# Patient Record
Sex: Male | Born: 1945
Health system: Southern US, Community
[De-identification: ages and names within clinical notes are randomized; demographics above are authoritative.]

## PROBLEM LIST (undated history)

## (undated) DIAGNOSIS — N401 Enlarged prostate with lower urinary tract symptoms: Secondary | ICD-10-CM

## (undated) DIAGNOSIS — M4620 Osteomyelitis of vertebra, site unspecified: Secondary | ICD-10-CM

## (undated) DIAGNOSIS — Z9861 Coronary angioplasty status: Secondary | ICD-10-CM

## (undated) DIAGNOSIS — Z8616 Personal history of COVID-19: Secondary | ICD-10-CM

## (undated) DIAGNOSIS — I878 Other specified disorders of veins: Secondary | ICD-10-CM

## (undated) DIAGNOSIS — I10 Essential (primary) hypertension: Secondary | ICD-10-CM

## (undated) DIAGNOSIS — G47419 Narcolepsy without cataplexy: Secondary | ICD-10-CM

## (undated) DIAGNOSIS — E7800 Pure hypercholesterolemia, unspecified: Secondary | ICD-10-CM

## (undated) DIAGNOSIS — G629 Polyneuropathy, unspecified: Secondary | ICD-10-CM

## (undated) DIAGNOSIS — K6812 Psoas muscle abscess: Secondary | ICD-10-CM

## (undated) DIAGNOSIS — M5137 Other intervertebral disc degeneration, lumbosacral region: Secondary | ICD-10-CM

## (undated) DIAGNOSIS — N329 Bladder disorder, unspecified: Secondary | ICD-10-CM

## (undated) DIAGNOSIS — I219 Acute myocardial infarction, unspecified: Secondary | ICD-10-CM

## (undated) DIAGNOSIS — U071 COVID-19: Secondary | ICD-10-CM

## (undated) DIAGNOSIS — I252 Old myocardial infarction: Secondary | ICD-10-CM

## (undated) DIAGNOSIS — R600 Localized edema: Secondary | ICD-10-CM

## (undated) DIAGNOSIS — Z8739 Personal history of other diseases of the musculoskeletal system and connective tissue: Secondary | ICD-10-CM

## (undated) DIAGNOSIS — R200 Anesthesia of skin: Secondary | ICD-10-CM

## (undated) DIAGNOSIS — Z973 Presence of spectacles and contact lenses: Secondary | ICD-10-CM

## (undated) DIAGNOSIS — R7303 Prediabetes: Secondary | ICD-10-CM

## (undated) DIAGNOSIS — Z201 Contact with and (suspected) exposure to tuberculosis: Secondary | ICD-10-CM

## (undated) DIAGNOSIS — M51379 Other intervertebral disc degeneration, lumbosacral region without mention of lumbar back pain or lower extremity pain: Secondary | ICD-10-CM

## (undated) DIAGNOSIS — F4310 Post-traumatic stress disorder, unspecified: Secondary | ICD-10-CM

## (undated) DIAGNOSIS — N400 Enlarged prostate without lower urinary tract symptoms: Secondary | ICD-10-CM

## (undated) DIAGNOSIS — Z85820 Personal history of malignant melanoma of skin: Secondary | ICD-10-CM

## (undated) DIAGNOSIS — C4490 Unspecified malignant neoplasm of skin, unspecified: Secondary | ICD-10-CM

## (undated) DIAGNOSIS — I2510 Atherosclerotic heart disease of native coronary artery without angina pectoris: Secondary | ICD-10-CM

## (undated) HISTORY — DX: Acute myocardial infarction, unspecified: I21.9

## (undated) HISTORY — DX: Atherosclerotic heart disease of native coronary artery without angina pectoris: I25.10

## (undated) HISTORY — DX: Psoas muscle abscess: K68.12

## (undated) HISTORY — DX: Narcolepsy without cataplexy: G47.419

## (undated) HISTORY — PX: CORONARY ANGIOPLASTY: SHX604

## (undated) HISTORY — PX: ANTERIOR CERVICAL DECOMP/DISCECTOMY FUSION: SHX1161

## (undated) HISTORY — PX: CATARACT EXTRACTION: SUR2

## (undated) HISTORY — PX: WOUND DEBRIDEMENT: SHX247

## (undated) HISTORY — PX: CORONARY ARTERY BYPASS GRAFT: SHX141

## (undated) HISTORY — DX: Essential (primary) hypertension: I10

## (undated) HISTORY — DX: Pure hypercholesterolemia, unspecified: E78.00

## (undated) HISTORY — DX: Post-traumatic stress disorder, unspecified: F43.10

## (undated) HISTORY — PX: BACK SURGERY: SHX140

## (undated) HISTORY — DX: Unspecified malignant neoplasm of skin, unspecified: C44.90

## (undated) HISTORY — DX: Personal history of COVID-19: Z86.16

## (undated) HISTORY — DX: Osteomyelitis of vertebra, site unspecified: M46.20

---
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
## 1968-05-13 HISTORY — PX: OTHER SURGICAL HISTORY: SHX169

## 1968-05-13 HISTORY — PX: MELANOMA EXCISION: SHX5266

## 1983-05-14 DIAGNOSIS — F4310 Post-traumatic stress disorder, unspecified: Secondary | ICD-10-CM

## 1983-05-14 HISTORY — DX: Post-traumatic stress disorder, unspecified: F43.10

## 1999-11-13 ENCOUNTER — Encounter: Admission: RE | Admit: 1999-11-13 | Discharge: 1999-11-13 | Payer: Self-pay | Admitting: *Deleted

## 2000-02-20 ENCOUNTER — Encounter: Payer: Self-pay | Admitting: Neurosurgery

## 2000-02-22 ENCOUNTER — Encounter: Payer: Self-pay | Admitting: Neurosurgery

## 2000-02-22 ENCOUNTER — Inpatient Hospital Stay (HOSPITAL_COMMUNITY): Admission: RE | Admit: 2000-02-22 | Discharge: 2000-02-23 | Payer: Self-pay | Admitting: Neurosurgery

## 2000-03-07 ENCOUNTER — Encounter: Admission: RE | Admit: 2000-03-07 | Discharge: 2000-03-07 | Payer: Self-pay | Admitting: Internal Medicine

## 2000-03-07 ENCOUNTER — Encounter: Payer: Self-pay | Admitting: Neurosurgery

## 2001-05-13 HISTORY — PX: CERVICAL FUSION: SHX112

## 2002-07-12 ENCOUNTER — Encounter: Payer: Self-pay | Admitting: Infectious Diseases

## 2002-07-12 ENCOUNTER — Ambulatory Visit (HOSPITAL_COMMUNITY): Admission: RE | Admit: 2002-07-12 | Discharge: 2002-07-12 | Payer: Self-pay | Admitting: Infectious Diseases

## 2002-12-08 ENCOUNTER — Encounter: Admission: RE | Admit: 2002-12-08 | Discharge: 2002-12-08 | Payer: Self-pay | Admitting: Internal Medicine

## 2003-01-16 ENCOUNTER — Emergency Department (HOSPITAL_COMMUNITY): Admission: EM | Admit: 2003-01-16 | Discharge: 2003-01-16 | Payer: Self-pay | Admitting: Emergency Medicine

## 2003-02-01 ENCOUNTER — Encounter: Payer: Self-pay | Admitting: Internal Medicine

## 2003-02-01 ENCOUNTER — Encounter: Admission: RE | Admit: 2003-02-01 | Discharge: 2003-02-01 | Payer: Self-pay | Admitting: Internal Medicine

## 2003-03-09 ENCOUNTER — Encounter: Admission: RE | Admit: 2003-03-09 | Discharge: 2003-03-09 | Payer: Self-pay | Admitting: Neurosurgery

## 2003-06-26 ENCOUNTER — Inpatient Hospital Stay (HOSPITAL_COMMUNITY): Admission: AD | Admit: 2003-06-26 | Discharge: 2003-07-02 | Payer: Self-pay | Admitting: Cardiology

## 2003-06-26 ENCOUNTER — Encounter: Payer: Self-pay | Admitting: Emergency Medicine

## 2003-06-28 DIAGNOSIS — Z951 Presence of aortocoronary bypass graft: Secondary | ICD-10-CM

## 2003-06-28 HISTORY — DX: Presence of aortocoronary bypass graft: Z95.1

## 2003-08-01 ENCOUNTER — Encounter (HOSPITAL_COMMUNITY): Admission: RE | Admit: 2003-08-01 | Discharge: 2003-10-30 | Payer: Self-pay | Admitting: Cardiovascular Disease

## 2008-04-29 ENCOUNTER — Emergency Department (HOSPITAL_COMMUNITY): Admission: EM | Admit: 2008-04-29 | Discharge: 2008-04-29 | Payer: Self-pay | Admitting: Emergency Medicine

## 2008-05-01 ENCOUNTER — Ambulatory Visit (HOSPITAL_COMMUNITY): Admission: RE | Admit: 2008-05-01 | Discharge: 2008-05-01 | Payer: Self-pay | Admitting: Neurosurgery

## 2008-05-13 LAB — HM HEPATITIS C SCREENING LAB: HM Hepatitis Screen: NEGATIVE

## 2010-06-02 ENCOUNTER — Encounter: Payer: Self-pay | Admitting: Neurosurgery

## 2010-09-28 NOTE — H&P (Signed)
[place]. [facility]  Patient:    Ivan [name], Ivan [name]                      MRN: [id number] Adm. Date:  [id number] Attending:  Emeterio Reeve                         History and Physical  ADMITTING DIAGNOSIS:  Cervical spondylosis with early myelopathy.  HISTORY OF PRESENT ILLNESS:  A 65 year old right-handed white gentleman, in June, had a episode of bilateral pain, numbness and weakness in his arms. This was transient and prompted an MRI. This demonstrated severe spinal stenosis, and he was referred to me.  On my visit with him we talked for some time and he needed to get some affairs in order, and now presents for an anterior cervicectomy effusion.  PAST MEDICAL HISTORY:  Otherwise, benign.  PAST SURGICAL HISTORY: 1. Appendectomy in 1958. 2. Benign leg tumor in 1970.  CURRENT MEDICATIONS:  Aspirin and Vioxx.  ALLERGIES:  None.  SOCIAL HISTORY:  He smokes a pipe during the day. Drinks alcohol on a social basis. 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  FAMILY HISTORY:  Both parents are deceased. No significant history is given.  REVIEW OF SYSTEMS:  Remarkable for hypercholesterolemia, arm weakness, arm pain and neck pain.  PHYSICAL EXAMINATION:  HEENT:  Within normal limits.  NECK:  Reasonable range of motion in his neck. No Lhermittes phenomenon.  CHEST:  A few crackles.  CARDIAC:  Regular rate and rhythm.  ABDOMEN:  Nontender. No hepatosplenomegaly.  EXTREMITIES:  Without clubbing or cyanosis.  GENITOURINARY:  Deferred.  EXTREMITIES:  Peripheral pulses are good.  NEUROLOGIC:  He is awake, alert and oriented. Cranial nerves are intact. Motor exam showed 5/5 strength throughout the upper and lower extremities. There is no current sensory deficit, but he does have episodic tingling into both hands. Reflexes are 2 at the biceps, 1 at the triceps, 1 at the brachial radialis. Hoffmanns is negative in the lower  extremities, are nonmyopathic.  He comes accompanied with an MRI demonstrating severe cervical spinal stenosis in the canal area at C4-5, C5-6 and C6-7. C5-6 is worse, however. The canal is probably in the order of 6-7 mm. There is no myelomalacia.  CLINICAL IMPRESSION:  Cervical spondylosis and myopathic events. Based on the extreme narrowing of the canal we have recommended an anterior cervicectomy effusion at C4-5, C5-6, and C6-7. The risks and benefits of this approach have been discussed with him and he wishes to proceed. DD:  02/22/00 TD:  02/22/00 Job: [id number]

## 2010-09-28 NOTE — Op Note (Signed)
[place]. [facility]  Patient:    Ivan [name], Ivan [name]                      MRN: [id number] Proc. Date: 02/22/00 Adm. Date:  [id number] Attending:  Emeterio Reeve                           Operative Report  PREOPERATIVE DIAGNOSIS:  Severe spondylosis at C4-5, C5-6, C6-C7.  POSTOPERATIVE DIAGNOSIS:  Severe spondylosis at C4-5, C5-6, C6-C7.  OPERATION:  Anterior cervical diskectomy and fusion at C4-5, C5-6, and C6-C7.  SURGEON:  Payton Doughty, M.D.  ANESTHESIA:  General endotracheal.  PREP:  Sterile Betadine prep and scrubbed with alcohol wipe.  COMPLICATIONS:  None.  DESCRIPTION OF PROCEDURE:   This is a 65 year old right handed white gentleman with severe cervical spondylosis who was taken to the operating room, anesthetized and intubated.  Placed supine on the operating room table. Following shave, prep and drape in the usual sterile fashion, skin was incised starting one fingerbreadths above the clavicle for a distance of approximately 10 cm paralleling the sternocleidomastoid muscle on the left side.  The tensor was identified and elevated, divided and undermined.  Sternocleidomastoid was identified and revealed the carotid artery, which retracted laterally to the left.  Trachea and esophagus retracted laterally to the right exposing the bones of the anterior cervical spine.  Marker was placed. Intraoperative x-ray obtained and correctness of level.  The omohyoid muscle was partly divided to provide access to the C6-7 disc space.  The longus coli was taken down and the self retaining retractor placed.  Diskectomy was then carried out at C4-5, C5-6 and C6-7. It was done first under gross observation and then using the operating microscope.  At C4-5 and C6-7 there was moderate to severe cervical spondylitic disease with posterior osteophytes encroaching into the spinal canal.  These were removed without difficulty.  At C5-C6 there was extremely severe  spinal stenosis with a large osteophyte that was removed without difficulty and without cord compression.  Neuroforamin at each level were explored bilaterally and found to be open.  Then 7 mm bone grafts were fashioned for patella Allograft and tapped into place.  A 63 mm tethered plate was then placed with 13 mm screws, two in C4, one in C5, one in C6 and two in C7.  Intraoperative x-rays showed good placement of bone grafts, plate and screws.  The wound was irrigated and hemostasis assured.  The platysma was reapproximated with 3-0 Vicryl interrupted fashion and subcutaneous tissue was reapproximated with 3-0 Vicryl interrupted fashion. Subcuticular tissues were approximated with 3-0 Vicryl in an interrupted fashion.  The skin was closed with 4-0 Vicryl in a running subcuticular fashion.  Benzoin and Steri-Strips were placed and made occlusive with Telfa and Op-Site.  The patient then placed in an Aspen collar and returned to the recovery room in good condition. D:  02/22/00 TD:  02/23/00 Job: [id number]

## 2010-09-28 NOTE — Discharge Summary (Signed)
NAME:  Ivan [name], Ivan [name]                         ACCOUNT NO.:  [id number]   MEDICAL RECORD NO.:  [id number]                   PATIENT TYPE:  INP   LOCATION:  2012                                 FACILITY:  [facility]   PHYSICIAN:  Salvatore Decent. Cornelius Moras, M.D.              DATE OF BIRTH:  Nov 23, 1945   DATE OF ADMISSION:  06/26/2003  DATE OF DISCHARGE:  07/02/2003                                 DISCHARGE SUMMARY   PRIMARY ADMITTING DIAGNOSIS:  Chest pain.   ADDITIONAL/DISCHARGE DIAGNOSES:  1. Acute anteroseptal myocardial infarction.  2. Severe two vessel coronary artery disease.  3. History of anxiety.  4. History of cervical diskectomy in 2001 by Dr. Channing Mutters.   PROCEDURES PERFORMED:  1. Cardiac catheterization.  2. Coronary artery bypass grafting x4 (left internal mammary artery to the     distal LAD, right internal mammary artery to the distal right coronary     artery, saphenous vein graft to the first diagonal, saphenous vein graft     to the first circumflex marginal).  3. Endoscopic vein harvest, right thigh.   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  He has no previous history of coronary artery  disease.  On the date of this admission, he awoke with sharp chest pain  which radiated to his back and down his left arm.  He had some nausea, as  well as vomiting.  His symptoms waxed and waned, and he went on to work, and  had the staff at the nursing home check his blood pressure.  His blood  pressure was found to be elevated, and he continued to feel poorly;  therefore, he had one of his associates drive him to the emergency  department at [facility] where he was evaluated.  The initial EKG showed  evidence of an acute anteroseptal myocardial infarction.  He was started on  IV nitroglycerin and IV heparin, and a cardiology consultation was obtained.  He was seen by Dr. Patty Sermons, and was found to have elevated cardiac  enzymes.   Because of his acute myocardial infarction and his ongoing  symptoms, he was transferred to [facility] emergency room for cardiac  catheterization.   HOSPITAL COURSE:  He was transferred from [facility] to [facility], and was  taken immediately to the cardiac catheterization laboratory.  He underwent  cardiac catheterization by Dr. Elease Hashimoto, and was found to have severe two  vessel coronary artery disease.  He underwent an angioplasty of the LAD with  successful restoration of normal antegrade flow; however, there was a high-  grade irregular plaque which remained in the proximal portion of the LAD,  arising from the take-off of a large diagonal branch.  Because of this  anatomy, it was not felt to be favorable for a stent placement.  A  cardiothoracic surgery consultation was obtained while the  patient remained  in the catheterization laboratory.  Dr. Cornelius Moras saw the patient and agreed that  surgical revascularization was his best option.  He remained stable and had  no further chest pain.  He was able to be returned to the floor and  continued on IV heparin, Integrilin, and nitroglycerin.  He remained stable  and pain free.  After a complete preoperative work-up, he was able to be  taken to the operating room on June 28, 2003 where he underwent CABG x4  as described in detail above.  He tolerated the procedure well and was  transferred to the SICU in stable condition.  He was able to be extubated  shortly after surgery.  He was hemodynamically stable and doing well on  postoperative day #1.  At that time, he was mobilized.  His chest tubes and  hemodynamic monitoring devices were removed.  He was started on a beta  blocker, as well as his home dose of Xanax.  He remained stable throughout  the day, and was able to be transferred to the floor late in the day on  postoperative day #1.  Overall, he has had an uneventful postoperative  course.  He has been somewhat volume overloaded, and has  been started on  Lasix, and is diuresing well from this.  He has been ambulating in the  halls, both with cardiac rehabilitation phase I, as well as independently,  and is doing very well.  His blood pressures have remained stable, and he  has been started on an ACE inhibitor.  He has been weaned from supplemental  oxygen, and is maintaining O2 saturations of greater than 90% on room air.  His most recently laboratories showed a hemoglobin of 10.5, hematocrit 30.5,  platelets 180, white count 12.7, potassium 4.4, BUN 17, creatinine 1.0.  These were drawn on June 30, 2003.  A chest x-ray also performed on that  day mild atelectasis; otherwise, stable.  His surgical incision sites were  all healing well.  He has been ambulating in the halls, tolerating a regular  diet, and is having normal bowel and bladder function.  It is felt that if  he continues to remain stable over the next 24 hours, he should be ready for  discharge home on July 02, 2003.   DISCHARGE MEDICATIONS:  1. Enteric-coated aspirin 325 mg daily.  2. Lipitor 40 mg daily.  3. Lopressor 25 mg b.i.d.  4. Altace 2.5 mg daily.  5. Lasix 40 mg daily x1 week.  6. K-Dur 20 mEq daily x1 week.  7. Xanax 0.5 mg p.r.n.  8. Tylox 1-2 q.4h. p.r.n. pain.   DISCHARGE INSTRUCTIONS:  1. He is to refrain from driving, heavy lifting, or strenuous activity.  He     may continue ambulating daily and using his incentive spirometer.  2. He may shower daily and clean his incisions with soap and water.  3. He will continue a low-fat, low-sodium diet.   DISCHARGE FOLLOW UP:  1. He is asked to follow up with Dr. Patty Sermons in 2 weeks and have a chest x-     ray at that visit.  2. He will then see Dr. Cornelius Moras on Monday, July 25, 2003, at 11:45 a.m.  He is     asked to bring his chest x-ray to this visit for Dr. Cornelius Moras to review.  In     the interim, he is asked to call our office if he experiences any     problems  or has questions.       Coral Ceo, P.A.                        Salvatore Decent. Cornelius Moras, M.D.    GC/MEDQ  D:  07/01/2003  T:  07/02/2003  Job:  [id number]   cc:   Corwin Levins, M.D. [facility]   Cassell Clement, M.D.  [address]  [place]  [place] [address]  Fax: [phone]

## 2010-09-28 NOTE — Op Note (Signed)
NAME:  Ivan [name], Ivan [name]                         ACCOUNT NO.:  [id number]   MEDICAL RECORD NO.:  [id number]                   PATIENT TYPE:  INP   LOCATION:  2306                                 FACILITY:  [facility]   PHYSICIAN:  Salvatore Decent. Cornelius Moras, M.D.              DATE OF BIRTH:  07/17/1945   DATE OF PROCEDURE:  06/28/2003  DATE OF DISCHARGE:                                 OPERATIVE REPORT   PREOPERATIVE DIAGNOSIS:  Severe 2-vessel coronary artery disease, status  post acute anterior myocardial infarction.   POSTOPERATIVE DIAGNOSIS:  Severe 2-vessel coronary artery disease, status  post acute anterior myocardial infarction.   PROCEDURE:  Median sternotomy for coronary artery bypass grafting x4 (left  internal mammary artery to the distal left anterior descending coronary  artery, right internal mammary artery to the distal  right coronary artery,  saphenous vein graft to the 1st diagonal branch, saphenous vein graft to the  1st circumflex marginal branch, endoscopic vein harvest from right thigh).   SURGEON:  Salvatore Decent. Cornelius Moras, M.D.   ASSISTANT:  Salvatore Decent. Dorris Fetch, M.D.   ASSISTANT:  Jerold Coombe, P.A.   ANESTHESIA:  General.   INDICATIONS FOR PROCEDURE:  The patient is a 65 year old male with no  previous  cardiac  history but risk factors including  a history of  hyperlipidemia and tobacco use. The patient was admitted June 26, 2003,  with an acute anterior myocardial infarction. He was taken to the  catheterization laboratory  where cardiac catheterization demonstrated  severe 2-vessel coronary artery disease with acute occlusion of the left  anterior descending coronary artery. The patient underwent percutaneous  transluminal coronary angioplasty of the left anterior descending coronary  artery with successful  restoration of normal antegrade flow. There remains  high-grade disease in this vessel at the takeoff of the bifurcation and the  stent was not placed due  to anatomical concerns. There is also high-grade  disease in the right coronary artery and low-grade disease in the circumflex  system. A full consultation  note has been dictated previously.   DESCRIPTION OF PROCEDURE:  The patient has been counseled at length  regarding the indications and potential benefits of coronary artery bypass  grafting. Alternative treatment strategies have been discussed. He  understands and accepts all associated risks of surgery including but not  limited to risks of death, stroke, myocardial infarction, congestive heart  failure, respiratory failure, pneumonia, bleeding requiring blood  transfusion, arrhythmia, infection and recurrent coronary artery disease.  All of his questions have been addressed.   DESCRIPTION OF PROCEDURE:  The patient was brought to the operating room on  the above mentioned date and central monitoring is established by the  anesthesia service. Specifically a Swann-Ganz catheter is placed through the  right internal jugular approach. A radial arterial line is placed.  Intravenous antibiotics are administered. Following  induction with general  endotracheal anesthesia under the care of  and direction of Dr. Jean Rosenthal, a  Foley catheter is placed.   Baseline transesophageal echocardiogram  is performed by Dr. Jean Rosenthal. This  confirms the presence of severe  hypokinesis involving the distal anterior  wall and apex. The remainder of the left ventricle appears to move normally  and overall global left ventricular performance is fairly good. There is no  aortic valve disease. There is no mitral valve disease. No other  significant abnormalities are identified. The patient's chest, abdomen, both  groins and both lower extremities are prepped and draped in a sterile  manner.   A median sternotomy incision is performed and the left internal mammary  artery is dissected from the chest wall and prepared for bypass grafting.  The left internal  mammary artery is a good quality conduit. Following  this  the  right internal mammary artery is also dissected from the chest wall and  prepared for bypass grafting. This vessel is also a good quality conduit.   Simultaneously, saphenous vein was obtained from the patient's right thigh  using endoscopic vein harvest technique. The saphenous vein is a good  quality conduit. The patient was heparinized systemically.   The pericardium is opened. The ascending aorta is normal in appearance. The  ascending aorta and the right atrium are cannulated for cardiopulmonary  bypass. Adequate heparinization is verified. Cardiopulmonary bypass is begun  and the surface of the heart  is inspected. There is a dusky appearance  involving the distal  anterior wall and the apex of the left ventricle  consistent with recent  myocardial infarction. No other  abnormalities are  identified. There is hard palpable  plaque in the proximal left circumflex  coronary artery and the 1st circumflex marginal branch. There is palpable  disease in the proximal left anterior descending coronary artery and the  proximal right coronary  artery as well. No other  abnormalities are  identified.   The distal sites are selected for coronary artery bypass grafting. Both  internal mammary  arteries are trimmed to appropriate length. A temperature  probe is placed in the left ventricular septum. A  cardioplegia catheter is  placed in the ascending aorta. The patient is cooled to 32 degrees systemic  temperature. The aortic cross clamp  is applied and cardioplegia is  delivered in an antegrade fashion through the aortic root. Iced saline slush  is applied for topical hypothermia.   The initial cardioplegic arrest and myocardial cooling are felt to be  excellent. Repeat  doses of cardioplegia are administered intermittently  through the cross clamp  portion of the operation, both through the aortic root and down the subsequently  placed vein graft to maintain septal  temperature below 15 degrees centigrade. The following distal coronary  anastomoses are performed.   1. The 1st circumflex marginal branch is grafted with a saphenous vein graft     in an end-to-side fashion. This coronary measures 2.0 mm in diameter and     is of good quality.  2. The 1st diagonal branch off the left anterior descending coronary artery     is grafted with a saphenous vein graft  in an end-to-side fashion. This     coronary measures 1.7 mm in diameter and is of good quality.  3. The distal right coronary artery is grafted with a right internal mammary     artery in an end-to-side fashion. This coronary measures 2.2 in diameter     and is of good quality.  4. The  distal left anterior descending coronary artery is grafted with a     left internal mammary artery in an end-to-side fashion. This coronary     measures 2.0 mm in diameter and is of good quality.   Both proximal saphenous vein anastomoses are performed directly to the  ascending aorta prior to removal of the aortic cross clamp. The septal  temperature is noted to rise rapidly upon reperfusion of the internal  mammary artery grafts. The heart  begins to beat spontaneously. All air is  evacuated from the aortic root. The aortic cross clamp  is removed after a  total cross clamp time of 70 minutes.   Normal sinus rhythm resumes spontaneously. All proximal and distal  anastomoses are inspected for hemostasis and appropriate graft  orientation.  Epicardial pacing wires are affixed to the right ventricular outflow tract  and to the right atrial appendage. The patient is rewarmed to a 37 degree  centigrade temperature. The patient was weaned from cardiopulmonary bypass  without difficulty. The patient's rhythm at separation from bypass is normal  sinus rhythm. No inotropic support is required. Total cardiopulmonary bypass  time for the operation is 87 minutes.   Follow up  transesophageal echocardiogram performed by Dr. Jean Rosenthal after  separation from bypass demonstrates preserved left ventricular function with  essentially no changes. There is clearly contractile activity in the distal  anterior wall and apex, although it remains hypocontractile. No other  abnormalities are identified.   The heart begins to beat spontaneously without need for cardioversion. All  proximal and distal anastomoses were  inspected for hemostasis and  appropriate graft  orientation. Epicardial pacing wires are affixed to the  right ventricular outflow tract into the right atrial appendage. The patient  was weaned from cardiopulmonary bypass without difficulty. The patient's  rhythm at separation from bypass is normal sinus rhythm. No inotropic  support was required. Total cardiopulmonary bypass time for the operation  was 71 minutes.   The venous and arterial cannulae were removed uneventfully. Protamine was administered to reverse the anticoagulation. The mediastinum and both  pleural spaces are irrigated with saline solution containing vancomycin.  Meticulous surgical hemostasis is ascertained. The mediastinum and both  pleural spaces are drained with 4 chest tubes placed through separate stab  incisions inferiorly. The median sternotomy is closed in a routine fashion.  The right thigh incision is closed in multiple layers in a routine fashion.  All skin incisions are closed with subcuticular skin closures.   The patient tolerated the procedure well. He was transported to the surgical  intensive care unit in stable condition. There were no intraoperative  complications. All sponge, instrument and needle counts were verified  correct at the completion of the operation. No blood products were  administered.                                               Salvatore Decent. Cornelius Moras, M.D.    CHO/MEDQ  D:  06/28/2003  T:  06/28/2003  Job:  [id number]   cc:   Cassell Clement, M.D.  [address]  [place]  [place] [address]  Fax: [phone]   Vesta Mixer, M.D.  [address], [address]  [place]  [place] [address]  Fax: [phone]   Corwin Levins, M.D. [facility]

## 2010-09-28 NOTE — Consult Note (Signed)
NAME:  Ivan [name], Ivan [name]                         ACCOUNT NO.:  [id number]   MEDICAL RECORD NO.:  [id number]                   PATIENT TYPE:  INP   LOCATION:  2930                                 FACILITY:  [facility]   PHYSICIAN:  Salvatore Decent. Cornelius Moras, M.D.              DATE OF BIRTH:  [birth year]   DATE OF CONSULTATION:  06/26/2003  DATE OF DISCHARGE:                                   CONSULTATION   REQUESTING PHYSICIAN:  Dr. Kristeen Miss.   PRIMARY CARDIOLOGIST:  Dr. Ronny Flurry   PRIMARY CARE PHYSICIAN:  Dr. Oliver Barre   REASON FOR CONSULTATION:  Severe two-vessel coronary artery disease with  acute anterior myocardial infarction.   HISTORY OF PRESENT ILLNESS:  Ivan [name] is a 65 year old white male with no  previous history of coronary artery disease but risk factors including  history of hyperlipidemia and tobacco use.  He was in his usual state of  health until approximately 5 a.m. when he developed sudden onset of severe  substernal chest pain associated with nausea.  The pain waxed and waned but  persisted for several hours prompting him to present to the emergency room  at [facility].  There, he was evaluated by Dr. Patty Sermons  and found to have 12-lead electrocardiogram with slight ST segment elevation  in the anterior lead, suggestive of early changes of acute anterior  myocardial infarction.  His initial set of cardiac enzymes were positive  with a total CK of 428, CK-MB fraction 31, troponin I 1.26.  The patient was  promptly transferred to [facility] where he underwent acute cardiac  catheterization by Dr. Delane Ginger.  Findings at the time of catheterization  include severe two-vessel coronary artery disease with acute occlusion of  the left anterior descending coronary artery.  There is severe akinesis of  the distal anterior wall and apex.  The left ventricular end-diastolic  pressure is mildly elevated at 22 mmHg.  The patient underwent  percutaneous  transilluminable coronary angioplasty at the left anterior descending  coronary artery with successful restoration of normal antegrade flow in the  vessel.  However, there remains high-grade irregular plaque in the proximal  left anterior descending coronary artery arising at takeoff of the large  diagonal branch.  This is felt not to be favorable for placement of stent  and stabilization of the plaque.  Cardiac surgical consultation was  requested.   REVIEW OF SYSTEMS:  GENERAL:  The patient reports feeling well up until this  morning.  He has a good appetite and has not been gaining or losing weight.  CARDIAC:  The patient specifically denies any previous history of substernal  chest pain, either with exertion or at rest prior to that which awoke him  from his sleep early this morning.  He has only mild exertional shortness of  breath which has not changed recently.  He denies any resting  shortness of  breath, PND, orthopnea, lower extremity edema, palpitations, syncope.  He  has mild exertional fatigue.  RESPIRATORY:  Negative.  The patient denies  productive cough, hemoptysis, wheezing.  The patient denies shortness of  breath at present.  GASTROINTESTINAL:  Negative.  The patient reports good  appetite, normal bowel function.  MUSCULOSKELETAL:  Negative with the  exception of chronic problems related to degenerative joint disease in his  neck and cervical spine.  NEUROLOGIC:  Negative.  HEENT:  Negative.  INFECTIOUS:  Negative.  URINARY:  Negative.  HEMATOLOGIC:  Negative.  The  patient denies bleeding, diathesis or easy bruising.  ENDOCRINE:  Negative.  The patient denies symptoms suggestive of diabetes.  He has known history of  mild hyperlipidemia which has not been treated.   PAST MEDICAL HISTORY:  Notable for a history of hyperlipidemia and cervical  disk disease with degenerative arthritis.  The patient denies known history  of hypertension, coronary artery  disease, congestive heart failure, previous  stroke, diabetes mellitus.   PAST SURGICAL HISTORY:  The patient underwent cervical laminectomy and  decompression by Dr. Trey [name] several years ago.  The patient underwent  appendectomy in the distant past.   SOCIAL HISTORY:  The patient is married and lives with his wife here in  [place].  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  He smokes a pipe regularly.  He has a remote history of cigarette smoking.  He drinks wine on a regular basis.  He denies history of excessive alcohol  ingestion.   FAMILY HISTORY:  Notable for the absence of premature coronary artery  disease.   MEDICATIONS PRIOR TO ADMISSION:  Aspirin 325 mg daily.   DRUG ALLERGIES:  None known.   PHYSICAL EXAMINATION:  GENERAL:  Well-appearing white male who remains in  the supine position in the cath lab on the table.  VITAL SIGNS:  He is currently in sinus rhythm with blood pressure 126/83,  pulse 94.  He denies ongoing chest pain or shortness of breath, and he is  breathing comfortably.  HEENT:  Grossly unrevealing.  NECK:  Supple.  There is no jugular venous distension.  Auscultation of the  chest reveals clear and symmetrical breath sounds bilaterally.  No wheezes  or rhonchi are noted.  CARDIOVASCULAR:  Regular rate and rhythm.  No murmurs or rubs are noted.  ABDOMEN:  Soft, nontender.  EXTREMITIES:  Warm, well-perfused.  There is a sheath in the right femoral  artery groin.  There is no lower extremity edema.  Distal pulses are  palpable.  There is no sign of venous insufficiency.  NEUROLOGIC:  Grossly  nonfocal and symmetrical.  RECTAL/GU:  Both deferred.   DIAGNOSTIC TESTS:  Cardiac catheterization performed by Dr. Elease Hashimoto is  reviewed and notable for findings as discussed previously.  After completion  of the angioplasty, there remains high-grade irregular plaque in the  proximal left anterior descending coronary artery, arising at  takeoff of the large diagonal branch.  The left circumflex system appears to be relatively  free of disease, although there is some plaque at the origin of the large  circumflex marginal branch.  This does not appear to be high-grade or flow-  limiting.  The right coronary artery is large and a dominant vessel.  There  is 90% stenosis at the proximal portion of this vessel.  The remainder of  the majority of the right coronary artery is severely aneurysmal.  There are  good distal targets for bypass grafting.  Left ventricular function is as  described previously with severe anterior apical akinesis.   IMPRESSION:  Severe two-vessel coronary artery disease, status post acute  anterior myocardial infarction, status post percutaneous transluminal  coronary angioplasty with restoration of antegrade flow in the left anterior  descending coronary artery.  Mr. Rudman appears stable at this point in  time.  I agree that he would probably best be treated in the long run with  surgical revascularization.   PLAN:  I have outlined options at length with Mr. Schomer briefly here in the  cath lab this afternoon.  All of his questions have been addressed.  We have  recommended that we proceed with surgery within the next 24-48 hours or  sooner if he should become unstable.  We will follow up later this afternoon  and discuss plans further.  All of his questions have been answered.                                               Salvatore Decent. Cornelius Moras, M.D.    CHO/MEDQ  D:  06/26/2003  T:  06/26/2003  Job:  [id number]   cc:   Vesta Mixer, M.D.  [address], [address]  [place]  [place] [address]  Fax: [phone]   Cassell Clement, M.D.  [address], [address]  [place]  [place] [address]  Fax: [phone]   Corwin Levins, M.D. [facility]

## 2010-09-28 NOTE — H&P (Signed)
NAME:  NAFTULI, DALSANTO                         ACCOUNT NO.:  [id number]   MEDICAL RECORD NO.:  [id number]                   PATIENT TYPE:  INP   LOCATION:  NA                                   FACILITY:  [facility]   PHYSICIAN:  Cassell Clement, M.D.              DATE OF BIRTH:  [birth year]   DATE OF ADMISSION:  06/26/2003  DATE OF DISCHARGE:                                HISTORY & PHYSICAL   HISTORY:  This is a 65 year old gentleman who is complaining of chest pain.  He has no history of known coronary artery disease, high blood pressure, or  past history of any heart trouble.  He is on no regular medicine except for  daily aspirin, and he takes occasional Xanax for anxiety.  This morning at  approximately 5:15, he awoke with sharp chest pain radiating to the back and  down the left arm.  He was sick on his stomach and had slight vomiting as  well as nausea.  He was not particularly diaphoretic.  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  He had the staff check  his blood pressure when he arrived at work.  It was elevated, and he was  still feeling badly, and so he had one of the staff drive him to [facility]  Emergency Room where he was evaluated and is now being admitted.   His initial electrocardiogram and subsequent electrocardiogram do show  evidence of an acute anteroseptal MI with QS pattern V1 and V2 and ST  elevation in V1 and V2 as well as slight ST elevation in aVL.  His cardiac  enzymes were elevated.  He was placed on IV nitroglycerin and IV heparin,  and the pain is improved.   FAMILY HISTORY:  Noncontributory in that both parents and also his only  sister were murdered.   SOCIAL HISTORY:  He is the Press photographer at [employer].  He smokes a pipe but not cigarettes.  He drinks occasional wine.  He is  married in his second marriage, has three children by  his first marriage.   ALLERGIES:  No known drug allergies   OPERATIONS:  Diskectomy in 2001 by Dr. Trey Sailors.   REVIEW OF SYSTEMS:  He has had no GI symptoms.  GENITOURINARY: No dysuria.  RESPIRATORY:  History of positive TB skin test but no lesions on chest x-  ray.  ENDOCRINOLOGY:  Reveals no diabetes but does have a history of  hypercholesterolemia in the 240s.   PHYSICAL EXAMINATION:  VITAL SIGNS:  Blood pressure 138/81, pulse 60 and  regular.  Respirations are normal.  NECK:  Reveals no carotid bruits.  CHEST:  Clear to auscultation.  HEART:  No murmur, gallop, rub, or click.  ABDOMEN:  Soft, nontender, no mass.  EXTREMITIES:  No phlebitis or edema.   LABORATORY AND X-RAY DATA:  Electrocardiogram, as noted, shows changes of  early acute anteroseptal MI.   Chest x-ray negative.   Hemoglobin 16, white count normal.  Pro time 11.9. Electrolytes normal.  Potassium 4.3. CK 428 with MB 31, troponin I of 1.26.   IMPRESSION:  Acute anteroseptal myocardial infarction.   DISPOSITION:  We are transferring from [facility] to [facility] for acute catheterization by Dr. Kristeen Miss.                                               Cassell Clement, M.D.    TB/MEDQ  D:  06/26/2003  T:  06/26/2003  Job:  [id number]   cc:   Vesta Mixer, M.D.  [address], [address]  [place]  [place] [address]  Fax: [phone]

## 2010-09-28 NOTE — Cardiovascular Report (Signed)
NAME:  MACKLIN, JACQUIN NO.:  [id number]   MEDICAL RECORD NO.:  [id number]                   PATIENT TYPE:  INP   LOCATION:  2930                                 FACILITY:  [facility]   PHYSICIAN:  Vesta Mixer, M.D.              DATE OF BIRTH:  Jun 07, 1945   DATE OF PROCEDURE:  06/26/2003  DATE OF DISCHARGE:                              CARDIAC CATHETERIZATION   HISTORY OF PRESENT ILLNESS:  Mr. Kozak is a 65 year old gentleman with a  previous history of hyperlipidemia. He presented to the [facility] emergency room this morning with episodes of chest pain that were  only partially relieved with the sublingual nitroglycerin. He was found to  have changes consistent with an anterior septal myocardial infarction and  was transferred to [facility] for further evaluation.   PROCEDURE:  Left heart catheterization with coronary angiography with  percutaneous transluminal coronary angioplasty of the left anterior  descending artery.   DESCRIPTION OF PROCEDURE:  The right femoral artery was easily cannulated  using a modified Seldinger technique.   HEMODYNAMICS:  The left ventricular pressure was 110/22. His aortic pressure  was 110/71.   ANGIOGRAPHY:  The left main is fairly normal.   The left anterior descending artery is occluded after giving off a fairly  large septal perforator branch. The left circumflex artery is moderate in  size. There is a small lesion in the proximal obtuse marginal artery, which  represents perhaps, a 20% to 25% stenosis. His right coronary artery is very  large and is ectatic. It is dominant. It is very dilated in the proximal and  mid segments. He has a 90% mid stenosis. The distal posterior descending  artery and the posterolateral segment arteries are fairly normal and are  good targets for bypass grafting.   The left ventriculogram reveals a mildly dilated left ventricle. There is  anteroapical akinesis. The ejection fraction is 40% to 45%. He has no mitral  regurgitation.   ANGIOPLASTY:  The left main was engaged using a #7 [place] Judkins left 4  guide. Heparin 4,000 units were given followed by a double bolus Integrilin  drip. The BMW wire was initially used but would not cross the LAD stenosis.  At that point, an [facility] wire was successful in crossing into the  lower aspect of the LAD. It appeared that there was a fairly extensive  bifurcation at the site of the occlusion.   Following the successful wire placement, we placed a 3.0 x 15 mm Quantum  Maverick down across the stenosis. We inflated it up to 8 atmospheres for 45  seconds, resulting in a markedly improved vessel lumen. He had TIMI grade 3  antegrade flow. After opening the left anterior descending, it became clear  that there was a diagonal that arose within the middle of this plaque. The  origin of the diagonal had a  very acute angled take-off and is not  assessable through percutaneous methods. Given the extensive plaque burden  and the acute angle of the origin of the diagonal vessel as well as the  unfavorable right coronary artery, it was decided to seek surgical opinion  regarding bypass surgery. Dr. Cornelius Moras was consulted and he agreed that the  patient would best be served by bypass surgery. At this point, we stopped  the angioplasty procedure after a successful angioplasty of the left  anterior descending. We will not place a stent because of the high  likelihood that this would close the diagonal vessel. In addition, the right  coronary artery is a very ectatic and dilated vessel and is probably 8 mm in  diameter. I do not think that we would be successful in stenting that  vessel. He has very good surgical targets. The patient remains pain free  after opening up this vessel. We will continue to follow him very closely.  We will continue him on Integrilin. We will discontinue the heparin  only  long enough to get out the femoral artery sheath but then restart it right  away. He will be transferred to the CCU in stable condition.                                               Vesta Mixer, M.D.    PJN/MEDQ  D:  06/26/2003  T:  06/26/2003  Job:  [id number]   cc:   Corwin Levins, M.D. [facility]

## 2013-04-29 ENCOUNTER — Ambulatory Visit (INDEPENDENT_AMBULATORY_CARE_PROVIDER_SITE_OTHER): Payer: BC Managed Care – PPO | Admitting: Nurse Practitioner

## 2013-04-29 ENCOUNTER — Encounter: Payer: Self-pay | Admitting: *Deleted

## 2013-04-29 VITALS — BP 156/84 | HR 90 | Temp 97.8°F | Resp 10 | Ht 70.0 in | Wt 225.4 lb

## 2013-04-29 DIAGNOSIS — I10 Essential (primary) hypertension: Secondary | ICD-10-CM

## 2013-04-29 DIAGNOSIS — Z125 Encounter for screening for malignant neoplasm of prostate: Secondary | ICD-10-CM

## 2013-04-29 DIAGNOSIS — F4310 Post-traumatic stress disorder, unspecified: Secondary | ICD-10-CM

## 2013-04-29 DIAGNOSIS — I2510 Atherosclerotic heart disease of native coronary artery without angina pectoris: Secondary | ICD-10-CM | POA: Insufficient documentation

## 2013-04-29 DIAGNOSIS — E7800 Pure hypercholesterolemia, unspecified: Secondary | ICD-10-CM

## 2013-04-29 DIAGNOSIS — G47419 Narcolepsy without cataplexy: Secondary | ICD-10-CM | POA: Insufficient documentation

## 2013-04-29 MED ORDER — METOPROLOL TARTRATE 25 MG PO TABS: 25.0000 mg | ORAL_TABLET | Freq: Two times a day (BID) | ORAL | Status: DC

## 2013-04-29 NOTE — Patient Instructions (Signed)
New prescription for metoprolol 25 mg twice daily sent to pharmacy  Record HR and BP and bring to next visit  Follow up in 4 weeks with fasting blood work before visit

## 2013-04-29 NOTE — Progress Notes (Signed)
Patient ID: Ivan [name], male   DOB: 22-Sep-1945, 67 y.o.   MRN: [id number]    Allergies  Allergen Reactions  . Statins     Leg cramps, N&V    Chief Complaint  Patient presents with  . Establish Care    New patient establish care   . Immunizations    Will update flu through employer     HPI: Patient is a 67 y.o. male seen in the office today to establish care; Pt has not had PCP in many years; needed a physical. 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 History of nacroelpsy- has prescription for ritalin and adderal due to this; he takes either or not both Takes as many drug holidays as possible but when he needs stay awake due to work he has to take medication  History of PTSD after his sister and cousin murdered his mother, father, and grandparents and then killed themselves; pt found them Dr Evelene Croon- psychiatrist prescribes these medications and follows up with him every 6 month Dr Everlene Farrier- dermatology Dr Noel Gerold- orthopedics- was having steroid injects in cervical spine (previously done by Dr Channing Mutters neurosurgery)  Was previosuly on metoprolol BID which worked well for hypertension  Review of Systems: Review of Systems  Constitutional: Negative for malaise/fatigue.  HENT: Negative for nosebleeds and sore throat.   Respiratory: Negative for cough and shortness of breath.   Cardiovascular: Negative for chest pain and palpitations.  Gastrointestinal: Negative for heartburn, diarrhea and constipation.  Genitourinary: Negative for dysuria, urgency and frequency.  Musculoskeletal: Positive for back pain (occasionally).       See orthopedic for occasional aches and pains  Skin: Negative for itching and rash.       See dermatology  Neurological: Negative for dizziness, tingling, sensory change, weakness and headaches.  Endo/Heme/Allergies: Positive for environmental allergies.  Psychiatric/Behavioral: Negative for depression. The patient is not  nervous/anxious and does not have insomnia.        PTSD     Past Medical History  Diagnosis Date  . Hypertension   . High cholesterol   . Narcolepsy   . PTSD (post-traumatic stress disorder) 1985    family was murder and he found the bodies   . Skin cancer     history of  . CAD (coronary artery disease)   . Myocardial infarction    Past Surgical History  Procedure Laterality Date  . Appendectomy  1958    Dr Odis Luster  . Excision left leg  1970    Dr Cephus Shelling  . Cervical fusion  2003    Mark Reg  . Coronary artery bypass graft     Social History:   reports that he has been smoking Pipe.  He does not have any smokeless tobacco history on file. He reports that he drinks about 8.4 ounces of alcohol per week. He reports that he does not use illicit drugs.  Family History  Problem Relation Age of Onset  . Cancer Father     skin cancer    Medications: Patient's Medications  New Prescriptions   No medications on file  Previous Medications   AMPHETAMINE-DEXTROAMPHETAMINE (ADDERALL) 30 MG TABLET    Take 30 mg by mouth as needed.    ASPIRIN 325 MG TABLET    Take 325 mg by mouth daily.   METHYLPHENIDATE (RITALIN) 20 MG TABLET    Take 20 mg by mouth 3 (three) times daily with meals.   TEMAZEPAM (RESTORIL) 15 MG CAPSULE  Take 15 mg by mouth at bedtime as needed for sleep.  Modified Medications   No medications on file  Discontinued Medications   No medications on file     Physical Exam:  Filed Vitals:   04/29/13 1319  BP: 156/84  Pulse: 90  Temp: 97.8 F (36.6 C)  TempSrc: Oral  Resp: 10  Height: 5\' 10"  (1.778 m)  Weight: 225 lb 6.4 oz (102.241 kg)  SpO2: 96%   Physical Exam  Constitutional: He is well-developed, well-nourished, and in no distress.  HENT:  Head: Normocephalic and atraumatic.  Right Ear: External ear normal.  Left Ear: External ear normal.  Mouth/Throat: Oropharynx is clear and moist. No oropharyngeal exudate.  Eyes: Conjunctivae and EOM are  normal. Pupils are equal, round, and reactive to light.  Neck: Normal range of motion. Neck supple. No thyromegaly present.  Cardiovascular: Normal rate and regular rhythm.   Pulmonary/Chest: Effort normal and breath sounds normal. No respiratory distress.  Abdominal: Soft. Bowel sounds are normal. He exhibits no distension.  Musculoskeletal: He exhibits no edema.  Lymphadenopathy:    He has no cervical adenopathy.  Neurological: He is alert.  Skin: Skin is warm and dry. He is not diaphoretic.      Assessment/Plan 1. High cholesterol encouraged diet modifications and exercise - Lipid panel; Future - Comprehensive metabolic panel; Future  2. Hypertension -elevated today and with pt history will start medication at this time - CBC With differential/Platelet; Future - metoprolol tartrate (LOPRESSOR) 25 MG tablet; Take 1 tablet (25 mg total) by mouth 2 (two) times daily.  Dispense: 60 tablet; Refill: 3 -to take BP and HR and bring log to next visit  3. Narcolepsy -follows with psychiatrist   4. CAD (coronary artery disease) -no episodes of chest pain -conts ASA  5. PTSD (post-traumatic stress disorder) -stable; conts to follow up with psychiatrist   6. Prostate cancer screening -family history will get screening PSA with labs

## 2013-05-24 ENCOUNTER — Other Ambulatory Visit: Payer: BC Managed Care – PPO

## 2013-05-24 DIAGNOSIS — Z125 Encounter for screening for malignant neoplasm of prostate: Secondary | ICD-10-CM

## 2013-05-24 DIAGNOSIS — I10 Essential (primary) hypertension: Secondary | ICD-10-CM

## 2013-05-24 DIAGNOSIS — E7800 Pure hypercholesterolemia, unspecified: Secondary | ICD-10-CM

## 2013-05-25 LAB — CBC WITH DIFFERENTIAL
Basophils Absolute: 0 10*3/uL (ref 0.0–0.2)
Basos: 0 %
Eos: 6 %
Eosinophils Absolute: 0.6 10*3/uL — ABNORMAL HIGH (ref 0.0–0.4)
HCT: 50.7 % (ref 37.5–51.0)
Hemoglobin: 16.9 g/dL (ref 12.6–17.7)
Immature Grans (Abs): 0 10*3/uL (ref 0.0–0.1)
Immature Granulocytes: 0 %
Lymphocytes Absolute: 2.2 10*3/uL (ref 0.7–3.1)
Lymphs: 23 %
MCH: 30.8 pg (ref 26.6–33.0)
MCHC: 33.3 g/dL (ref 31.5–35.7)
MCV: 92 fL (ref 79–97)
Monocytes Absolute: 1.1 10*3/uL — ABNORMAL HIGH (ref 0.1–0.9)
Monocytes: 12 %
Neutrophils Absolute: 5.5 10*3/uL (ref 1.4–7.0)
Neutrophils Relative %: 59 %
Platelets: 311 10*3/uL (ref 150–379)
RBC: 5.49 x10E6/uL (ref 4.14–5.80)
RDW: 13.6 % (ref 12.3–15.4)
WBC: 9.5 10*3/uL (ref 3.4–10.8)

## 2013-05-25 LAB — COMPREHENSIVE METABOLIC PANEL
ALT: 33 IU/L (ref 0–44)
AST: 26 IU/L (ref 0–40)
Albumin/Globulin Ratio: 1.9 (ref 1.1–2.5)
Albumin: 4.6 g/dL (ref 3.6–4.8)
Alkaline Phosphatase: 65 IU/L (ref 39–117)
BUN/Creatinine Ratio: 20 (ref 10–22)
BUN: 16 mg/dL (ref 8–27)
CO2: 25 mmol/L (ref 18–29)
Calcium: 9.7 mg/dL (ref 8.6–10.2)
Chloride: 98 mmol/L (ref 97–108)
Creatinine, Ser: 0.8 mg/dL (ref 0.76–1.27)
GFR calc Af Amer: 108 mL/min/{1.73_m2} (ref >59)
GFR calc non Af Amer: 94 mL/min/{1.73_m2} (ref >59)
Globulin, Total: 2.4 g/dL (ref 1.5–4.5)
Glucose: 96 mg/dL (ref 65–99)
Potassium: 5.1 mmol/L (ref 3.5–5.2)
Sodium: 140 mmol/L (ref 134–144)
Total Bilirubin: 0.7 mg/dL (ref 0.0–1.2)
Total Protein: 7 g/dL (ref 6.0–8.5)

## 2013-05-25 LAB — PSA: PSA: 1.6 ng/mL (ref 0.0–4.0)

## 2013-05-25 LAB — LIPID PANEL
Chol/HDL Ratio: 4.4 ratio units (ref 0.0–5.0)
Cholesterol, Total: 221 mg/dL — ABNORMAL HIGH (ref 100–199)
HDL: 50 mg/dL (ref >39)
LDL Calculated: 154 mg/dL — ABNORMAL HIGH (ref 0–99)
Triglycerides: 85 mg/dL (ref 0–149)
VLDL Cholesterol Cal: 17 mg/dL (ref 5–40)

## 2013-05-26 ENCOUNTER — Encounter: Payer: Self-pay | Admitting: Nurse Practitioner

## 2013-05-26 ENCOUNTER — Ambulatory Visit (INDEPENDENT_AMBULATORY_CARE_PROVIDER_SITE_OTHER): Payer: BC Managed Care – PPO | Admitting: Nurse Practitioner

## 2013-05-26 VITALS — BP 152/86 | HR 92 | Temp 98.6°F | Resp 20 | Wt 225.0 lb

## 2013-05-26 DIAGNOSIS — I10 Essential (primary) hypertension: Secondary | ICD-10-CM

## 2013-05-26 DIAGNOSIS — Z23 Encounter for immunization: Secondary | ICD-10-CM

## 2013-05-26 DIAGNOSIS — Z111 Encounter for screening for respiratory tuberculosis: Secondary | ICD-10-CM

## 2013-05-26 DIAGNOSIS — F17200 Nicotine dependence, unspecified, uncomplicated: Secondary | ICD-10-CM

## 2013-05-26 DIAGNOSIS — E7800 Pure hypercholesterolemia, unspecified: Secondary | ICD-10-CM

## 2013-05-26 DIAGNOSIS — I2510 Atherosclerotic heart disease of native coronary artery without angina pectoris: Secondary | ICD-10-CM

## 2013-05-26 DIAGNOSIS — Z1211 Encounter for screening for malignant neoplasm of colon: Secondary | ICD-10-CM

## 2013-05-26 DIAGNOSIS — G47419 Narcolepsy without cataplexy: Secondary | ICD-10-CM

## 2013-05-26 MED ORDER — EZETIMIBE 10 MG PO TABS: 10.0000 mg | ORAL_TABLET | Freq: Every day | ORAL | Status: DC

## 2013-05-26 NOTE — Patient Instructions (Signed)
-Will give you Prevnar-13 vaccine today and in 6 months to a year you will receive the pneumonia 23   -please follow up in 4 months with Dr Bubba Camp with fasting blood work prior to visit  Cardiac Diet This diet can help prevent heart disease and stroke. Many factors influence your heart health, including eating and exercise habits. Coronary risk rises a lot with abnormal blood fat (lipid) levels. Cardiac meal planning includes limiting unhealthy fats, increasing healthy fats, and making other small dietary changes. General guidelines are as follows:  Adjust calorie intake to reach and maintain desirable body weight.  Limit total fat intake to less than 30% of total calories. Saturated fat should be less than 7% of calories.  Saturated fats are found in animal products and in some vegetable products. Saturated vegetable fats are found in coconut oil, cocoa butter, palm oil, and palm kernel oil. Read labels carefully to avoid these products as much as possible. Use butter in moderation. Choose tub margarines and oils that have 2 grams of fat or less. Good cooking oils are canola and olive oils.  Practice low-fat cooking techniques. Do not fry food. Instead, broil, bake, boil, steam, grill, roast on a rack, stir-fry, or microwave it. Other fat reducing suggestions include:  Remove the skin from poultry.  Remove all visible fat from meats.  Skim the fat off stews, soups, and gravies before serving them.  Steam vegetables in water or broth instead of sauting them in fat.  Avoid foods with trans fat (or hydrogenated oils), such as commercially fried foods and commercially baked goods. Commercial shortening and deep-frying fats will contain trans fat.  Increase intake of fruits, vegetables, whole grains, and legumes to replace foods high in fat.  Increase consumption of nuts, legumes, and seeds to at least 4 servings weekly. One serving of a legume equals  cup, and 1 serving of nuts or seeds  equals  cup.  Choose whole grains more often. Have 3 servings per day (a serving is 1 ounce [oz]).  Eat 4 to 5 servings of vegetables per day. A serving of vegetables is 1 cup of raw leafy vegetables;  cup of raw or cooked cut-up vegetables;  cup of vegetable juice.  Eat 4 to 5 servings of fruit per day. A serving of fruit is 1 medium whole fruit;  cup of dried fruit;  cup of fresh, frozen, or canned fruit;  cup of 100% fruit juice.  Increase your intake of dietary fiber to 20 to 30 grams per day. Insoluble fiber may help lower your risk of heart disease and may help curb your appetite.  Soluble fiber binds cholesterol to be removed from the blood. Foods high in soluble fiber are dried beans, citrus fruits, oats, apples, bananas, broccoli, [name] sprouts, and eggplant.  Try to include foods fortified with plant sterols or stanols, such as yogurt, breads, juices, or margarines. Choose several fortified foods to achieve a daily intake of 2 to 3 grams of plant sterols or stanols.  Foods with omega-3 fats can help reduce your risk of heart disease. Aim to have a 3.5 oz portion of fatty fish twice per week, such as salmon, mackerel, albacore tuna, sardines, lake trout, or herring. If you wish to take a fish oil supplement, choose one that contains 1 gram of both DHA and EPA.  Limit processed meats to 2 servings (3 oz portion) weekly.  Limit the sodium in your diet to 1500 milligrams (mg) per day. If you have  high blood pressure, talk to a registered dietitian about a DASH (Dietary Approaches to Stop Hypertension) eating plan.  Limit sweets and beverages with added sugar, such as soda, to no more than 5 servings per week. One serving is:   1 tablespoon sugar.  1 tablespoon jelly or jam.   cup sorbet.  1 cup lemonade.   cup regular soda. CHOOSING FOODS Starches  Allowed: Breads: All kinds (wheat, rye, raisin, white, oatmeal, [place], [place], and English muffin bread). Low-fat  rolls: English muffins, frankfurter and hamburger buns, bagels, pita bread, tortillas (not fried). Pancakes, waffles, biscuits, and muffins made with recommended oil.  Avoid: Products made with saturated or trans fats, oils, or whole milk products. Butter rolls, cheese breads, croissants. Commercial doughnuts, muffins, sweet rolls, biscuits, waffles, pancakes, store-bought mixes. Crackers  Allowed: Low-fat crackers and snacks: Animal, graham, rye, saltine (with recommended oil, no lard), oyster, and matzo crackers. Bread sticks, melba toast, rusks, flatbread, pretzels, and light popcorn.  Avoid: High-fat crackers: cheese crackers, butter crackers, and those made with coconut, palm oil, or trans fat (hydrogenated oils). Buttered popcorn. Cereals  Allowed: Hot or cold whole-grain cereals.  Avoid: Cereals containing coconut, hydrogenated vegetable fat, or animal fat. Potatoes / Pasta / Rice  Allowed: All kinds of potatoes, rice, and pasta (such as macaroni, spaghetti, and noodles).  Avoid: Pasta or rice prepared with cream sauce or high-fat cheese. Chow mein noodles, [place] fries. Vegetables  Allowed: All vegetables and vegetable juices.  Avoid: Fried vegetables. Vegetables in cream, butter, or high-fat cheese sauces. Limit coconut. Fruit in cream or custard. Protein  Allowed: Limit your intake of meat, seafood, and poultry to no more than 6 oz (cooked weight) per day. All lean, well-trimmed beef, veal, pork, and lamb. All chicken and [place] without skin. All fish and shellfish. Wild game: wild duck, rabbit, pheasant, and venison. Egg whites or low-cholesterol egg substitutes may be used as desired. Meatless dishes: recipes with dried beans, peas, lentils, and tofu (soybean curd). Seeds and nuts: all seeds and most nuts.  Avoid: Prime grade and other heavily marbled and fatty meats, such as short ribs, spare ribs, rib eye roast or steak, frankfurters, sausage, bacon, and high-fat luncheon  meats, mutton. Caviar. Commercially fried fish. Domestic duck, goose, venison sausage. Organ meats: liver, gizzard, heart, chitterlings, brains, kidney, sweetbreads. Dairy  Allowed: Low-fat cheeses: nonfat or low-fat cottage cheese (1% or 2% fat), cheeses made with part skim milk, such as mozzarella, farmers, string, or ricotta. (Cheeses should be labeled no more than 2 to 6 grams fat per oz.). Skim (or 1%) milk: liquid, powdered, or evaporated. Buttermilk made with low-fat milk. Drinks made with skim or low-fat milk or cocoa. Chocolate milk or cocoa made with skim or low-fat (1%) milk. Nonfat or low-fat yogurt.  Avoid: Whole milk cheeses, including colby, cheddar, [place], Monterey Jack, [place], [place], [place], American, Swiss, and blue. Creamed cottage cheese, cream cheese. Whole milk and whole milk products, including buttermilk or yogurt made from whole milk, drinks made from whole milk. Condensed milk, evaporated whole milk, and 2% milk. Soups and Combination Foods  Allowed: Low-fat low-sodium soups: broth, dehydrated soups, homemade broth, soups with the fat removed, homemade cream soups made with skim or low-fat milk. Low-fat spaghetti, lasagna, chili, and Spanish rice if low-fat ingredients and low-fat cooking techniques are used.  Avoid: Cream soups made with whole milk, cream, or high-fat cheese. All other soups. Desserts and Sweets  Allowed: Sherbet, fruit ices, gelatins, meringues, and angel food cake. Homemade  desserts with recommended fats, oils, and milk products. Jam, jelly, honey, marmalade, sugars, and syrups. Pure sugar candy, such as gum drops, hard candy, jelly beans, marshmallows, mints, and small amounts of dark chocolate.  Avoid: Commercially prepared cakes, pies, cookies, frosting, pudding, or mixes for these products. Desserts containing whole milk products, chocolate, coconut, lard, palm oil, or palm kernel oil. Ice cream or ice cream drinks. Candy that contains  chocolate, coconut, butter, hydrogenated fat, or unknown ingredients. Buttered syrups. Fats and Oils  Allowed: Vegetable oils: safflower, sunflower, corn, soybean, cottonseed, sesame, canola, olive, or peanut. Non-hydrogenated margarines. Salad dressing or mayonnaise: homemade or commercial, made with a recommended oil. Low or nonfat salad dressing or mayonnaise.  Limit added fats and oils to 6 to 8 tsp per day (includes fats used in cooking, baking, salads, and spreads on bread). Remember to count the "hidden fats" in foods.  Avoid: Solid fats and shortenings: butter, lard, salt pork, bacon drippings. Gravy containing meat fat, shortening, or suet. Cocoa butter, coconut. Coconut oil, palm oil, palm kernel oil, or hydrogenated oils: these ingredients are often used in bakery products, nondairy creamers, whipped toppings, candy, and commercially fried foods. Read labels carefully. Salad dressings made of unknown oils, sour cream, or cheese, such as blue cheese and Roquefort. Cream, all kinds: half-and-half, light, heavy, or whipping. Sour cream or cream cheese (even if "light" or low-fat). Nondairy cream substitutes: coffee creamers and sour cream substitutes made with palm, palm kernel, hydrogenated oils, or coconut oil. Beverages  Allowed: Coffee (regular or decaffeinated), tea. Diet carbonated beverages, mineral water. Alcohol: Check with your caregiver. Moderation is recommended.  Avoid: Whole milk, regular sodas, and juice drinks with added sugar. Condiments  Allowed: All seasonings and condiments. Cocoa powder. "Cream" sauces made with recommended ingredients.  Avoid: Carob powder made with hydrogenated fats. SAMPLE MENU Breakfast   cup orange juice   cup oatmeal  1 slice toast  1 tsp margarine  1 cup skim milk Lunch  [place] sandwich with 2 oz [place], 2 slices bread  Lettuce and tomato slices  Fresh fruit  Carrot sticks  Coffee or tea Snack  Fresh fruit or low-fat  crackers Dinner  3 oz lean ground beef  1 baked potato  1 tsp margarine   cup asparagus  Lettuce salad  1 tbs non-creamy dressing   cup peach slices  1 cup skim milk Document Released: 02/06/2008 Document Revised: 10/29/2011 Document Reviewed: 07/23/2011 [employer] Patient Information 2014 [place], [place]. Smoking Cessation Quitting smoking is important to your health and has many advantages. However, it is not always easy to quit since nicotine is a very addictive drug. Often times, people try 3 times or more before being able to quit. This document explains the best ways for you to prepare to quit smoking. Quitting takes hard work and a lot of effort, but you can do it. ADVANTAGES OF QUITTING SMOKING  You will live longer, feel better, and live better.  Your body will feel the impact of quitting smoking almost immediately.  Within 20 minutes, blood pressure decreases. Your pulse returns to its normal level.  After 8 hours, carbon monoxide levels in the blood return to normal. Your oxygen level increases.  After 24 hours, the chance of having a heart attack starts to decrease. Your breath, hair, and body stop smelling like smoke.  After 48 hours, damaged nerve endings begin to recover. Your sense of taste and smell improve.  After 72 hours, the body is virtually free of nicotine.  Your bronchial tubes relax and breathing becomes easier.  After 2 to 12 weeks, lungs can hold more air. Exercise becomes easier and circulation improves.  The risk of having a heart attack, stroke, cancer, or lung disease is greatly reduced.  After 1 year, the risk of coronary heart disease is cut in half.  After 5 years, the risk of stroke falls to the same as a nonsmoker.  After 10 years, the risk of lung cancer is cut in half and the risk of other cancers decreases significantly.  After 15 years, the risk of coronary heart disease drops, usually to the level of a nonsmoker.  If you are  pregnant, quitting smoking will improve your chances of having a healthy baby.  The people you live with, especially any children, will be healthier.  You will have extra money to spend on things other than cigarettes. QUESTIONS TO THINK ABOUT BEFORE ATTEMPTING TO QUIT You may want to talk about your answers with your caregiver.  Why do you want to quit?  If you tried to quit in the past, what helped and what did not?  What will be the most difficult situations for you after you quit? How will you plan to handle them?  Who can help you through the tough times? Your family? Friends? A caregiver?  What pleasures do you get from smoking? What ways can you still get pleasure if you quit? Here are some questions to ask your caregiver:  How can you help me to be successful at quitting?  What medicine do you think would be best for me and how should I take it?  What should I do if I need more help?  What is smoking withdrawal like? How can I get information on withdrawal? GET READY  Set a quit date.  Change your environment by getting rid of all cigarettes, ashtrays, matches, and lighters in your home, car, or work. Do not let people smoke in your home.  Review your past attempts to quit. Think about what worked and what did not. GET SUPPORT AND ENCOURAGEMENT You have a better chance of being successful if you have help. You can get support in many ways.  Tell your family, friends, and co-workers that you are going to quit and need their support. Ask them not to smoke around you.  Get individual, group, or telephone counseling and support. Programs are available at [employer] and health centers. Call your local health department for information about programs in your area.  Spiritual beliefs and practices may help some smokers quit.  Download a "quit meter" on your computer to keep track of quit statistics, such as how long you have gone without smoking, cigarettes not smoked,  and money saved.  Get a self-help book about quitting smoking and staying off of tobacco. [place] yourself from urges to smoke. Talk to someone, go for a walk, or occupy your time with a task.  Change your normal routine. Take a different route to work. Drink tea instead of coffee. Eat breakfast in a different place.  Reduce your stress. Take a hot bath, exercise, or read a book.  Plan something enjoyable to do every day. Reward yourself for not smoking.  Explore interactive web-based programs that specialize in helping you quit. GET MEDICINE AND USE IT CORRECTLY Medicines can help you stop smoking and decrease the urge to smoke. Combining medicine with the above behavioral methods and support can greatly increase your chances  of successfully quitting smoking.  Nicotine replacement therapy helps deliver nicotine to your body without the negative effects and risks of smoking. Nicotine replacement therapy includes nicotine gum, lozenges, inhalers, nasal sprays, and skin patches. Some may be available over-the-counter and others require a prescription.  Antidepressant medicine helps people abstain from smoking, but how this works is unknown. This medicine is available by prescription.  Nicotinic receptor partial agonist medicine simulates the effect of nicotine in your brain. This medicine is available by prescription. Ask your caregiver for advice about which medicines to use and how to use them based on your health history. Your caregiver will tell you what side effects to look out for if you choose to be on a medicine or therapy. Carefully read the information on the package. Do not use any other product containing nicotine while using a nicotine replacement product.  RELAPSE OR DIFFICULT SITUATIONS Most relapses occur within the first 3 months after quitting. Do not be discouraged if you start smoking again. Remember, most people try several times before  finally quitting. You may have symptoms of withdrawal because your body is used to nicotine. You may crave cigarettes, be irritable, feel very hungry, cough often, get headaches, or have difficulty concentrating. The withdrawal symptoms are only temporary. They are strongest when you first quit, but they will go away within 10 14 days. To reduce the chances of relapse, try to:  Avoid drinking alcohol. Drinking lowers your chances of successfully quitting.  Reduce the amount of caffeine you consume. Once you quit smoking, the amount of caffeine in your body increases and can give you symptoms, such as a rapid heartbeat, sweating, and anxiety.  Avoid smokers because they can make you want to smoke.  Do not let weight gain distract you. Many smokers will gain weight when they quit, usually less than 10 pounds. Eat a healthy diet and stay active. You can always lose the weight gained after you quit.  Find ways to improve your mood other than smoking. FOR MORE INFORMATION  www.smokefree.gov  Document Released: 04/23/2001 Document Revised: 10/29/2011 Document Reviewed: 08/08/2011 [facility] Patient Information 2014 [place], [place].
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
## 2013-05-27 ENCOUNTER — Encounter: Payer: Self-pay | Admitting: Gastroenterology

## 2013-06-01 ENCOUNTER — Ambulatory Visit: Payer: Self-pay

## 2013-06-28 ENCOUNTER — Encounter: Payer: Self-pay | Admitting: Gastroenterology

## 2014-01-26 ENCOUNTER — Emergency Department (HOSPITAL_COMMUNITY): Payer: BC Managed Care – PPO

## 2014-01-26 ENCOUNTER — Emergency Department (HOSPITAL_COMMUNITY)
Admission: EM | Admit: 2014-01-26 | Discharge: 2014-01-26 | Disposition: A | Discharge status: Home / Self Care | Payer: BC Managed Care – PPO | Attending: Emergency Medicine | Admitting: Emergency Medicine

## 2014-01-26 ENCOUNTER — Encounter (HOSPITAL_COMMUNITY): Payer: Self-pay | Admitting: Emergency Medicine

## 2014-01-26 DIAGNOSIS — Z862 Personal history of diseases of the blood and blood-forming organs and certain disorders involving the immune mechanism: Secondary | ICD-10-CM | POA: Diagnosis not present

## 2014-01-26 DIAGNOSIS — F17200 Nicotine dependence, unspecified, uncomplicated: Secondary | ICD-10-CM | POA: Diagnosis not present

## 2014-01-26 DIAGNOSIS — I10 Essential (primary) hypertension: Secondary | ICD-10-CM | POA: Diagnosis not present

## 2014-01-26 DIAGNOSIS — W010XXA Fall on same level from slipping, tripping and stumbling without subsequent striking against object, initial encounter: Secondary | ICD-10-CM | POA: Diagnosis not present

## 2014-01-26 DIAGNOSIS — I2510 Atherosclerotic heart disease of native coronary artery without angina pectoris: Secondary | ICD-10-CM | POA: Diagnosis not present

## 2014-01-26 DIAGNOSIS — Z85828 Personal history of other malignant neoplasm of skin: Secondary | ICD-10-CM | POA: Insufficient documentation

## 2014-01-26 DIAGNOSIS — Y9389 Activity, other specified: Secondary | ICD-10-CM | POA: Diagnosis not present

## 2014-01-26 DIAGNOSIS — Z8659 Personal history of other mental and behavioral disorders: Secondary | ICD-10-CM | POA: Diagnosis not present

## 2014-01-26 DIAGNOSIS — Y9289 Other specified places as the place of occurrence of the external cause: Secondary | ICD-10-CM | POA: Insufficient documentation

## 2014-01-26 DIAGNOSIS — Z8639 Personal history of other endocrine, nutritional and metabolic disease: Secondary | ICD-10-CM | POA: Insufficient documentation

## 2014-01-26 DIAGNOSIS — I252 Old myocardial infarction: Secondary | ICD-10-CM | POA: Insufficient documentation

## 2014-01-26 DIAGNOSIS — S298XXA Other specified injuries of thorax, initial encounter: Secondary | ICD-10-CM | POA: Insufficient documentation

## 2014-01-26 DIAGNOSIS — Z951 Presence of aortocoronary bypass graft: Secondary | ICD-10-CM | POA: Insufficient documentation

## 2014-01-26 DIAGNOSIS — S2239XA Fracture of one rib, unspecified side, initial encounter for closed fracture: Secondary | ICD-10-CM | POA: Insufficient documentation

## 2014-01-26 DIAGNOSIS — S2232XA Fracture of one rib, left side, initial encounter for closed fracture: Secondary | ICD-10-CM

## 2014-01-26 DIAGNOSIS — Z8669 Personal history of other diseases of the nervous system and sense organs: Secondary | ICD-10-CM | POA: Diagnosis not present

## 2014-01-26 MED ORDER — OXYCODONE-ACETAMINOPHEN 5-325 MG PO TABS: 1.0000 | ORAL_TABLET | Freq: Four times a day (QID) | ORAL | Status: DC | PRN

## 2014-01-26 MED ORDER — OXYCODONE-ACETAMINOPHEN 5-325 MG PO TABS
2.0000 | ORAL_TABLET | Freq: Once | ORAL | Status: AC
  Administered 2014-01-26: 2 via ORAL
  Filled 2014-01-26: qty 2
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
## 2014-01-26 NOTE — ED Notes (Signed)
Pt c/o left lower rib pain that increases with movement. Reports last night around 11pm he was taking the trash out and tripped over something. sts the pain started after that. Denies LOC/hitting/use of blood thinners. Takes a daily ASA. Nad, skin warm and dry, resp e/u.

## 2014-01-26 NOTE — Discharge Instructions (Signed)
Please follow up with your primary care physician in 1-2 days. If you do not have one please call the [place] number listed above. Please take pain medication and/or muscle relaxants as prescribed and as needed for pain. Please do not drive on narcotic pain medication or on muscle relaxants. Please read all discharge instructions and return precautions.    Rib Fracture A rib fracture is a break or crack in one of the bones of the ribs. The ribs are a group of long, curved bones that wrap around your chest and attach to your spine. They protect your lungs and other organs in the chest cavity. A broken or cracked rib is often painful, but most do not cause other problems. Most rib fractures heal on their own over time. However, rib fractures can be more serious if multiple ribs are broken or if broken ribs move out of place and push against other structures. CAUSES   A direct blow to the chest. For example, this could happen during contact sports, a car accident, or a fall against a hard object.  Repetitive movements with high force, such as pitching a baseball or having severe coughing spells. SYMPTOMS   Pain when you breathe in or cough.  Pain when someone presses on the injured area. DIAGNOSIS  Your caregiver will perform a physical exam. Various imaging tests may be ordered to confirm the diagnosis and to look for related injuries. These tests may include a chest X-ray, computed tomography (CT), magnetic resonance imaging (MRI), or a bone scan. TREATMENT  Rib fractures usually heal on their own in 1-3 months. The longer healing period is often associated with a continued cough or other aggravating activities. During the healing period, pain control is very important. Medication is usually given to control pain. Hospitalization or surgery may be needed for more severe injuries, such as those in which multiple ribs are broken or the ribs have moved out of place.  HOME CARE  INSTRUCTIONS   Avoid strenuous activity and any activities or movements that cause pain. Be careful during activities and avoid bumping the injured rib.  Gradually increase activity as directed by your caregiver.  Only take over-the-counter or prescription medications as directed by your caregiver. Do not take other medications without asking your caregiver first.  Apply ice to the injured area for the first 1-2 days after you have been treated or as directed by your caregiver. Applying ice helps to reduce inflammation and pain.  Put ice in a plastic bag.  Place a towel between your skin and the bag.   Leave the ice on for 15-20 minutes at a time, every 2 hours while you are awake.  Perform deep breathing as directed by your caregiver. This will help prevent pneumonia, which is a common complication of a broken rib. Your caregiver may instruct you to:  Take deep breaths several times a day.  Try to cough several times a day, holding a pillow against the injured area.  Use a device called an incentive spirometer to practice deep breathing several times a day.  Drink enough fluids to keep your urine clear or pale yellow. This will help you avoid constipation.   Do not wear a rib belt or binder. These restrict breathing, which can lead to pneumonia.  SEEK IMMEDIATE MEDICAL CARE IF:   You have a fever.   You have difficulty breathing or shortness of breath.   You develop a continual cough, or you cough up  thick or bloody sputum.  You feel sick to your stomach (nausea), throw up (vomit), or have abdominal pain.   You have worsening pain not controlled with medications.  MAKE SURE YOU:  Understand these instructions.  Will watch your condition.  Will get help right away if you are not doing well or get worse. Document Released: 04/29/2005 Document Revised: 12/30/2012 Document Reviewed: 07/01/2012 [facility] Patient Information 2015 [place], [place]. This information is not  intended to replace advice given to you by your health care provider. Make sure you discuss any questions you have with your health care provider.

## 2014-01-26 NOTE — ED Notes (Signed)
Pt returned from radiology.

## 2014-01-29 NOTE — ED Provider Notes (Signed)
Medical screening examination/treatment/procedure(s) were performed by non-physician practitioner and as supervising physician I was immediately available for consultation/collaboration.   EKG Interpretation None        Ephraim Hamburger, MD 01/29/14 [phone]

## 2014-02-02 ENCOUNTER — Telehealth: Payer: Self-pay | Admitting: *Deleted

## 2014-02-02 NOTE — Telephone Encounter (Signed)
Okay to write a note for pt to return to work

## 2014-02-02 NOTE — Telephone Encounter (Signed)
Patient was here with his wife Jeanine Luz and said that you discuss his fractured ribs with him at this appointment also. And you told him when he was ready to go back to work you would write him a return note. Please Advise. There are no appointment till next week and he wants to return ASAP.

## 2014-02-03 NOTE — Telephone Encounter (Signed)
Letter printed and left up front to pick up. Patient Notified.

## 2014-05-05 ENCOUNTER — Ambulatory Visit: Payer: BC Managed Care – PPO | Admitting: Nurse Practitioner

## 2014-05-09 ENCOUNTER — Ambulatory Visit: Payer: BC Managed Care – PPO | Admitting: Nurse Practitioner

## 2014-05-09 ENCOUNTER — Encounter: Payer: Self-pay | Admitting: Nurse Practitioner

## 2014-05-09 DIAGNOSIS — Z0289 Encounter for other administrative examinations: Secondary | ICD-10-CM

## 2014-05-16 ENCOUNTER — Encounter: Payer: Self-pay | Admitting: Nurse Practitioner

## 2014-05-16 ENCOUNTER — Ambulatory Visit (INDEPENDENT_AMBULATORY_CARE_PROVIDER_SITE_OTHER): Payer: Medicare Other | Admitting: Nurse Practitioner

## 2014-05-16 VITALS — BP 162/94 | HR 72 | Temp 98.8°F | Wt 202.0 lb

## 2014-05-16 DIAGNOSIS — M5136 Other intervertebral disc degeneration, lumbar region: Secondary | ICD-10-CM | POA: Diagnosis not present

## 2014-05-16 DIAGNOSIS — I10 Essential (primary) hypertension: Secondary | ICD-10-CM | POA: Diagnosis not present

## 2014-05-16 MED ORDER — METOPROLOL TARTRATE 25 MG PO TABS: 25.0000 mg | ORAL_TABLET | Freq: Two times a day (BID) | ORAL | Status: DC

## 2014-05-16 MED ORDER — LISINOPRIL 10 MG PO TABS: 10.0000 mg | ORAL_TABLET | Freq: Every day | ORAL | Status: DC

## 2014-05-16 MED ORDER — TRAMADOL HCL 50 MG PO TABS: ORAL_TABLET | ORAL | Status: DC

## 2014-05-16 NOTE — Patient Instructions (Signed)
Follow up in 4-6 weeks
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
## 2014-06-13 ENCOUNTER — Ambulatory Visit: Payer: Self-pay | Admitting: Internal Medicine

## 2014-06-13 ENCOUNTER — Ambulatory Visit: Payer: Self-pay | Admitting: Nurse Practitioner

## 2014-07-25 ENCOUNTER — Telehealth: Payer: Self-pay

## 2014-07-25 NOTE — Telephone Encounter (Signed)
-----   Message from Lauree Chandler, NP sent at 07/25/2014  9:15 AM EDT ----- Pt needs appt with physician at [facility] also did not go to get screenings  ----- Message -----    From: SYSTEM    Sent: 07/25/2014  12:04 AM      To: Lauree Chandler, NP

## 2014-07-25 NOTE — Telephone Encounter (Signed)
Left message on voicemail for patient to return call when available, reason for call: Patient needs to schedule appointment with one of the Medical Doctors for follow-up as indicated on last OV instructions

## 2014-09-16 ENCOUNTER — Encounter: Payer: Self-pay | Admitting: Internal Medicine

## 2014-09-16 ENCOUNTER — Ambulatory Visit
Admission: RE | Admit: 2014-09-16 | Discharge: 2014-09-16 | Disposition: A | Discharge status: Home / Self Care | Payer: Medicare Other | Source: Ambulatory Visit | Attending: Internal Medicine | Admitting: Internal Medicine

## 2014-09-16 ENCOUNTER — Ambulatory Visit (INDEPENDENT_AMBULATORY_CARE_PROVIDER_SITE_OTHER): Payer: Medicare Other | Admitting: Internal Medicine

## 2014-09-16 VITALS — BP 146/80 | HR 81 | Temp 97.7°F | Resp 20 | Ht 71.0 in | Wt 186.0 lb

## 2014-09-16 DIAGNOSIS — I10 Essential (primary) hypertension: Secondary | ICD-10-CM

## 2014-09-16 DIAGNOSIS — S43401A Unspecified sprain of right shoulder joint, initial encounter: Secondary | ICD-10-CM | POA: Diagnosis not present

## 2014-09-16 DIAGNOSIS — M25511 Pain in right shoulder: Secondary | ICD-10-CM | POA: Diagnosis not present

## 2014-09-16 MED ORDER — OXYCODONE-ACETAMINOPHEN 5-325 MG PO TABS: 1.0000 | ORAL_TABLET | Freq: Four times a day (QID) | ORAL | Status: DC | PRN
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
## 2014-09-16 NOTE — Patient Instructions (Signed)
Acromioclavicular Injuries The AC (acromioclavicular) joint is the joint in the shoulder where the collarbone (clavicle) meets the shoulder blade (scapula). The part of the shoulder blade connected to the collarbone is called the acromion. Common problems with and treatments for the [facility] joint are detailed below. ARTHRITIS Arthritis occurs when the joint has been injured and the smooth padding between the joints (cartilage) is lost. This is the wear and tear seen in most joints of the body if they have been overused. This causes the joint to produce pain and swelling which is worse with activity.  AC JOINT SEPARATION AC joint separation means that the ligaments connecting the acromion of the shoulder blade and collarbone have been damaged, and the two bones no longer line up. AC separations can be anywhere from mild to severe, and are "graded" depending upon which ligaments are torn and how badly they are torn.  Grade I Injury: the least damage is done, and the [facility] joint still lines up.  Grade II Injury: damage to the ligaments which reinforce the [facility] joint. In a Grade II injury, these ligaments are stretched but not entirely torn. When stressed, the [facility] joint becomes painful and unstable.  Grade III Injury: AC and secondary ligaments are completely torn, and the collarbone is no longer attached to the shoulder blade. This results in deformity; a prominence of the end of the clavicle. AC JOINT FRACTURE AC joint fracture means that there has been a break in the bones of the [facility] joint, usually the end of the clavicle. TREATMENT TREATMENT OF AC ARTHRITIS  There is currently no way to replace the cartilage damaged by arthritis. The best way to improve the condition is to decrease the activities which aggravate the problem. Application of ice to the joint helps decrease pain and soreness (inflammation). The use of non-steroidal anti-inflammatory medication is helpful.  If less conservative measures do  not work, then cortisone shots (injections) may be used. These are anti-inflammatories; they decrease the soreness in the joint and swelling.  If non-surgical measures fail, surgery may be recommended. The procedure is generally removal of a portion of the end of the clavicle. This is the part of the collarbone closest to your acromion which is stabilized with ligaments to the acromion of the shoulder blade. This surgery may be performed using a tube-like instrument with a light (arthroscope) for looking into a joint. It may also be performed as an open surgery through a small incision by the surgeon. Most patients will have good range of motion within 6 weeks and may return to all activity including sports by 8-12 weeks, barring complications. TREATMENT OF AN AC SEPARATION  The initial treatment is to decrease pain. This is best accomplished by immobilizing the arm in a sling and placing an ice pack to the shoulder for 20 to 30 minutes every 2 hours as needed. As the pain starts to subside, it is important to begin moving the fingers, wrist, elbow and eventually the shoulder in order to prevent a stiff or "frozen" shoulder. Instruction on when and how much to move the shoulder will be provided by your caregiver. The length of time needed to regain full motion and function depends on the amount or grade of the injury. Recovery from a Grade I AC separation usually takes 10 to 14 days, whereas a Grade III may take 6 to 8 weeks.  Grade I and II separations usually do not require surgery. Even Grade III injuries usually allow return to  full activity with few restrictions. Treatment is also based on the activity demands of the injured shoulder. For example, a high level quarterback with an injured throwing arm will receive more aggressive treatment than someone with a desk job who rarely uses his/her arm for strenuous activities. In some cases, a painful lump may persist which could require a later surgery.  Surgery can be very successful, but the benefits must be weighed against the potential risks. TREATMENT OF AN AC JOINT FRACTURE Fracture treatment depends on the type of fracture. Sometimes a splint or sling may be all that is required. Other times surgery may be required for repair. This is more frequently the case when the ligaments supporting the clavicle are completely torn. Your caregiver will help you with these decisions and together you can decide what will be the best treatment. HOME CARE INSTRUCTIONS   Apply ice to the injury for 15-20 minutes each hour while awake for 2 days. Put the ice in a plastic bag and place a towel between the bag of ice and skin.  If a sling has been applied, wear it constantly for as long as directed by your caregiver, even at night. The sling or splint can be removed for bathing or showering or as directed. Be sure to keep the shoulder in the same place as when the sling is on. Do not lift the arm.  If a figure-of-eight splint has been applied it should be tightened gently by another person every day. Tighten it enough to keep the shoulders held back. Allow enough room to place the index finger between the body and strap. Loosen the splint immediately if there is numbness or tingling in the hands.  Take over-the-counter or prescription medicines for pain, discomfort or fever as directed by your caregiver.  If you or your child has received a follow up appointment, it is very important to keep that appointment in order to avoid long term complications, chronic pain or disability. SEEK MEDICAL CARE IF:   The pain is not relieved with medications.  There is increased swelling or discoloration that continues to get worse rather than better.  You or your child has been unable to follow up as instructed.  There is progressive numbness and tingling in the arm, forearm or hand. SEEK IMMEDIATE MEDICAL CARE IF:   The arm is numb, cold or pale.  There is  increasing pain in the hand, forearm or fingers. MAKE SURE YOU:   Understand these instructions.  Will watch your condition.  Will get help right away if you are not doing well or get worse. Document Released: 02/06/2005 Document Revised: 07/22/2011 Document Reviewed: 08/01/2008 [facility] Patient Information 2015 [place], [place]. This information is not intended to replace advice given to you by your health care provider. Make sure you discuss any questions you have with your health care provider.    No heavy lifting, pushing, pulling, reaching. Out of work until May 11th.  Pain meds as ordered  F/u as scheduled  May need Ortho eval

## 2014-10-06 ENCOUNTER — Ambulatory Visit (INDEPENDENT_AMBULATORY_CARE_PROVIDER_SITE_OTHER): Payer: Medicare Other | Admitting: Nurse Practitioner

## 2014-10-06 ENCOUNTER — Encounter: Payer: Self-pay | Admitting: Nurse Practitioner

## 2014-10-06 VITALS — BP 150/82 | HR 84 | Temp 98.1°F | Resp 20 | Ht 71.0 in | Wt 188.0 lb

## 2014-10-06 DIAGNOSIS — M25511 Pain in right shoulder: Secondary | ICD-10-CM

## 2014-10-06 DIAGNOSIS — L723 Sebaceous cyst: Secondary | ICD-10-CM

## 2014-10-06 DIAGNOSIS — I10 Essential (primary) hypertension: Secondary | ICD-10-CM | POA: Diagnosis not present

## 2014-10-06 DIAGNOSIS — E785 Hyperlipidemia, unspecified: Secondary | ICD-10-CM

## 2014-10-06 MED ORDER — LISINOPRIL 20 MG PO TABS: 20.0000 mg | ORAL_TABLET | Freq: Every day | ORAL | Status: DC
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
## 2014-10-06 NOTE — Patient Instructions (Signed)
Cut back on your sodium intake  Brown rice, whole wheat, sweet potato are better for you

## 2014-10-07 LAB — CBC WITH DIFFERENTIAL/PLATELET
Basophils Absolute: 0 10*3/uL (ref 0.0–0.2)
Basos: 0 %
EOS (ABSOLUTE): 0.6 10*3/uL — ABNORMAL HIGH (ref 0.0–0.4)
Eos: 7 %
Hematocrit: 47.3 % (ref 37.5–51.0)
Hemoglobin: 15.6 g/dL (ref 12.6–17.7)
Immature Grans (Abs): 0 10*3/uL (ref 0.0–0.1)
Immature Granulocytes: 0 %
Lymphocytes Absolute: 1.6 10*3/uL (ref 0.7–3.1)
Lymphs: 18 %
MCH: 31.3 pg (ref 26.6–33.0)
MCHC: 33 g/dL (ref 31.5–35.7)
MCV: 95 fL (ref 79–97)
Monocytes Absolute: 1 10*3/uL — ABNORMAL HIGH (ref 0.1–0.9)
Monocytes: 11 %
Neutrophils Absolute: 5.3 10*3/uL (ref 1.4–7.0)
Neutrophils: 64 %
Platelets: 286 10*3/uL (ref 150–379)
RBC: 4.98 x10E6/uL (ref 4.14–5.80)
RDW: 14.1 % (ref 12.3–15.4)
WBC: 8.5 10*3/uL (ref 3.4–10.8)

## 2014-10-07 LAB — COMPREHENSIVE METABOLIC PANEL
ALT: 16 IU/L (ref 0–44)
AST: 15 IU/L (ref 0–40)
Albumin/Globulin Ratio: 2 (ref 1.1–2.5)
Albumin: 4.3 g/dL (ref 3.6–4.8)
Alkaline Phosphatase: 69 IU/L (ref 39–117)
BUN/Creatinine Ratio: 29 — ABNORMAL HIGH (ref 10–22)
BUN: 20 mg/dL (ref 8–27)
Bilirubin Total: 0.3 mg/dL (ref 0.0–1.2)
CO2: 23 mmol/L (ref 18–29)
Calcium: 9.1 mg/dL (ref 8.6–10.2)
Chloride: 103 mmol/L (ref 97–108)
Creatinine, Ser: 0.68 mg/dL — ABNORMAL LOW (ref 0.76–1.27)
GFR calc Af Amer: 115 mL/min/{1.73_m2} (ref >59)
GFR calc non Af Amer: 100 mL/min/{1.73_m2} (ref >59)
Globulin, Total: 2.1 g/dL (ref 1.5–4.5)
Glucose: 88 mg/dL (ref 65–99)
Potassium: 4.5 mmol/L (ref 3.5–5.2)
Sodium: 141 mmol/L (ref 134–144)
Total Protein: 6.4 g/dL (ref 6.0–8.5)

## 2014-10-07 LAB — LIPID PANEL
Chol/HDL Ratio: 3.6 ratio units (ref 0.0–5.0)
Cholesterol, Total: 187 mg/dL (ref 100–199)
HDL: 52 mg/dL (ref >39)
LDL Calculated: 114 mg/dL — ABNORMAL HIGH (ref 0–99)
Triglycerides: 104 mg/dL (ref 0–149)
VLDL Cholesterol Cal: 21 mg/dL (ref 5–40)

## 2014-10-11 ENCOUNTER — Inpatient Hospital Stay (HOSPITAL_COMMUNITY)
Admission: EM | Admit: 2014-10-11 | Discharge: 2014-10-14 | DRG: 280 | Disposition: A | Discharge status: Home / Self Care | Payer: Medicare Other | Attending: Cardiovascular Disease | Admitting: Cardiovascular Disease

## 2014-10-11 ENCOUNTER — Emergency Department (HOSPITAL_COMMUNITY): Payer: Medicare Other

## 2014-10-11 ENCOUNTER — Encounter (HOSPITAL_COMMUNITY): Payer: Self-pay | Admitting: Emergency Medicine

## 2014-10-11 ENCOUNTER — Other Ambulatory Visit (HOSPITAL_COMMUNITY): Payer: Self-pay

## 2014-10-11 ENCOUNTER — Encounter (HOSPITAL_COMMUNITY)
Admission: EM | Disposition: A | Discharge status: Home / Self Care | Payer: Medicare Other | Source: Home / Self Care | Attending: Cardiovascular Disease

## 2014-10-11 DIAGNOSIS — I639 Cerebral infarction, unspecified: Secondary | ICD-10-CM | POA: Insufficient documentation

## 2014-10-11 DIAGNOSIS — R0602 Shortness of breath: Secondary | ICD-10-CM | POA: Diagnosis not present

## 2014-10-11 DIAGNOSIS — I213 ST elevation (STEMI) myocardial infarction of unspecified site: Secondary | ICD-10-CM | POA: Diagnosis not present

## 2014-10-11 DIAGNOSIS — F17210 Nicotine dependence, cigarettes, uncomplicated: Secondary | ICD-10-CM | POA: Diagnosis not present

## 2014-10-11 DIAGNOSIS — I6521 Occlusion and stenosis of right carotid artery: Secondary | ICD-10-CM | POA: Diagnosis not present

## 2014-10-11 DIAGNOSIS — Z951 Presence of aortocoronary bypass graft: Secondary | ICD-10-CM | POA: Diagnosis not present

## 2014-10-11 DIAGNOSIS — R079 Chest pain, unspecified: Secondary | ICD-10-CM | POA: Diagnosis not present

## 2014-10-11 DIAGNOSIS — F4310 Post-traumatic stress disorder, unspecified: Secondary | ICD-10-CM | POA: Diagnosis present

## 2014-10-11 DIAGNOSIS — R0789 Other chest pain: Secondary | ICD-10-CM | POA: Diagnosis not present

## 2014-10-11 DIAGNOSIS — I2111 ST elevation (STEMI) myocardial infarction involving right coronary artery: Secondary | ICD-10-CM | POA: Diagnosis present

## 2014-10-11 DIAGNOSIS — R51 Headache: Secondary | ICD-10-CM | POA: Diagnosis not present

## 2014-10-11 DIAGNOSIS — I10 Essential (primary) hypertension: Secondary | ICD-10-CM | POA: Insufficient documentation

## 2014-10-11 DIAGNOSIS — I2510 Atherosclerotic heart disease of native coronary artery without angina pectoris: Secondary | ICD-10-CM | POA: Diagnosis not present

## 2014-10-11 DIAGNOSIS — I2119 ST elevation (STEMI) myocardial infarction involving other coronary artery of inferior wall: Secondary | ICD-10-CM | POA: Diagnosis not present

## 2014-10-11 DIAGNOSIS — Z7982 Long term (current) use of aspirin: Secondary | ICD-10-CM

## 2014-10-11 DIAGNOSIS — G441 Vascular headache, not elsewhere classified: Secondary | ICD-10-CM | POA: Insufficient documentation

## 2014-10-11 DIAGNOSIS — E785 Hyperlipidemia, unspecified: Secondary | ICD-10-CM | POA: Insufficient documentation

## 2014-10-11 DIAGNOSIS — E780 Pure hypercholesterolemia: Secondary | ICD-10-CM | POA: Diagnosis present

## 2014-10-11 DIAGNOSIS — G47419 Narcolepsy without cataplexy: Secondary | ICD-10-CM | POA: Diagnosis present

## 2014-10-11 DIAGNOSIS — G459 Transient cerebral ischemic attack, unspecified: Secondary | ICD-10-CM | POA: Insufficient documentation

## 2014-10-11 DIAGNOSIS — I8290 Acute embolism and thrombosis of unspecified vein: Secondary | ICD-10-CM | POA: Insufficient documentation

## 2014-10-11 DIAGNOSIS — Z91041 Radiographic dye allergy status: Secondary | ICD-10-CM

## 2014-10-11 HISTORY — PX: CARDIAC CATHETERIZATION: SHX172

## 2014-10-11 LAB — I-STAT TROPONIN, ED: Troponin i, poc: 0.11 ng/mL (ref 0.00–0.08)

## 2014-10-11 LAB — CBC
HCT: 45.9 % (ref 39.0–52.0)
Hemoglobin: 15.7 g/dL (ref 13.0–17.0)
MCH: 32.2 pg (ref 26.0–34.0)
MCHC: 34.2 g/dL (ref 30.0–36.0)
MCV: 94.1 fL (ref 78.0–100.0)
Platelets: 237 10*3/uL (ref 150–400)
RBC: 4.88 MIL/uL (ref 4.22–5.81)
RDW: 14.1 % (ref 11.5–15.5)
WBC: 11.2 10*3/uL — ABNORMAL HIGH (ref 4.0–10.5)

## 2014-10-11 LAB — PROTIME-INR
INR: 1.11 (ref 0.00–1.49)
Prothrombin Time: 14.5 seconds (ref 11.6–15.2)

## 2014-10-11 SURGERY — LEFT HEART CATH AND CORONARY ANGIOGRAPHY
Anesthesia: LOCAL

## 2014-10-11 MED ORDER — FENTANYL CITRATE (PF) 100 MCG/2ML IJ SOLN
INTRAMUSCULAR | Status: AC
  Filled 2014-10-11: qty 2

## 2014-10-11 MED ORDER — HEPARIN BOLUS VIA INFUSION
4000.0000 [IU] | Freq: Once | INTRAVENOUS | Status: AC
  Administered 2014-10-11: 4000 [IU] via INTRAVENOUS

## 2014-10-11 MED ORDER — HEPARIN SODIUM (PORCINE) 1000 UNIT/ML IJ SOLN
INTRAMUSCULAR | Status: DC | PRN
  Administered 2014-10-11: 3000 [IU] via INTRAVENOUS

## 2014-10-11 MED ORDER — HEPARIN SODIUM (PORCINE) 1000 UNIT/ML IJ SOLN
INTRAMUSCULAR | Status: AC
  Filled 2014-10-11: qty 1

## 2014-10-11 MED ORDER — NITROGLYCERIN 0.4 MG SL SUBL
0.4000 mg | SUBLINGUAL_TABLET | SUBLINGUAL | Status: DC | PRN
  Administered 2014-10-11: 0.4 mg via SUBLINGUAL

## 2014-10-11 MED ORDER — FENTANYL CITRATE (PF) 100 MCG/2ML IJ SOLN
INTRAMUSCULAR | Status: DC | PRN
  Administered 2014-10-11 (×2): 50 ug via INTRAVENOUS

## 2014-10-11 MED ORDER — LIDOCAINE HCL (PF) 1 % IJ SOLN
INTRAMUSCULAR | Status: AC
  Filled 2014-10-11: qty 30

## 2014-10-11 MED ORDER — HEPARIN SODIUM (PORCINE) 1000 UNIT/ML IJ SOLN
INTRAMUSCULAR | Status: DC | PRN
  Administered 2014-10-11: 2000 [IU] via INTRAVENOUS

## 2014-10-11 MED ORDER — MIDAZOLAM HCL 2 MG/2ML IJ SOLN
INTRAMUSCULAR | Status: DC | PRN
  Administered 2014-10-11: 2 mg via INTRAVENOUS

## 2014-10-11 MED ORDER — NITROGLYCERIN 1 MG/10 ML FOR IR/CATH LAB
INTRA_ARTERIAL | Status: AC
  Filled 2014-10-11: qty 10

## 2014-10-11 MED ORDER — BIVALIRUDIN 250 MG IV SOLR
INTRAVENOUS | Status: AC
  Filled 2014-10-11: qty 250

## 2014-10-11 MED ORDER — TIROFIBAN HCL IV 5 MG/100ML
INTRAVENOUS | Status: DC | PRN
  Administered 2014-10-12: 0.15 ug/kg/min via INTRAVENOUS

## 2014-10-11 MED ORDER — HEPARIN (PORCINE) IN NACL 2-0.9 UNIT/ML-% IJ SOLN
INTRAMUSCULAR | Status: AC
  Filled 2014-10-11: qty 1000

## 2014-10-11 MED ORDER — MIDAZOLAM HCL 2 MG/2ML IJ SOLN
INTRAMUSCULAR | Status: AC
  Filled 2014-10-11: qty 2

## 2014-10-11 SURGICAL SUPPLY — 21 items
CATH INFINITI 5 FR IM (CATHETERS) ×2 IMPLANT
CATH INFINITI 5FR ANG PIGTAIL (CATHETERS) ×1 IMPLANT
CATH INFINITI 5FR MULTPACK ANG (CATHETERS) ×2 IMPLANT
CATH OPTITORQUE TIG 4.0 5F (CATHETERS) ×1 IMPLANT
DEVICE RAD COMP TR BAND LRG (VASCULAR PRODUCTS) ×1 IMPLANT
GLIDESHEATH SLEND A-KIT 6F 22G (SHEATH) ×1 IMPLANT
GUIDE CATH RUNWAY 6FR FR4 (CATHETERS) ×2 IMPLANT
GUIDE CATH RUNWAY 6FR IM (CATHETERS) ×2 IMPLANT
KIT ENCORE 26 ADVANTAGE (KITS) ×2 IMPLANT
KIT HEART LEFT (KITS) ×3 IMPLANT
PACK CARDIAC CATHETERIZATION (CUSTOM PROCEDURE TRAY) ×3 IMPLANT
SHEATH PINNACLE 5F 10CM (SHEATH) IMPLANT
SHEATH PINNACLE 6F 10CM (SHEATH) ×2 IMPLANT
SYR MEDRAD MARK V 150ML (SYRINGE) ×3 IMPLANT
TRANSDUCER W/STOPCOCK (MISCELLANEOUS) ×3 IMPLANT
TUBING CIL FLEX 10 FLL-RA (TUBING) ×3 IMPLANT
WIRE ASAHI MEDIUM 180CM (WIRE) ×2 IMPLANT
WIRE EMERALD 3MM-J .025X260CM (WIRE) ×2 IMPLANT
WIRE EMERALD 3MM-J .035X150CM (WIRE) ×2 IMPLANT
WIRE EMERALD 3MM-J .035X260CM (WIRE) ×2 IMPLANT
WIRE SAFE-T 1.5MM-J .035X260CM (WIRE) ×3 IMPLANT

## 2014-10-11 NOTE — ED Notes (Signed)
Cath lab called to inquire if they are ready for the pt yet.

## 2014-10-11 NOTE — Progress Notes (Signed)
Chaplain responded to code STEMI in ED.  Pt awake and talking with team upon chaplain arrival.  Chaplain offered to contact any family, pt asked for wife Neoma Laming to be contacted and informed that pt is "ok" as per pt's words. Chaplain attempted to contact spouse but reached voice mail  Chaplain will try again later.  Pt specified that he does not desire wife travel to ED at this time due to a recent surgery she had.  Chaplain will continue to follow up as needed.    10/11/14 2200  Clinical Encounter Type  Visited With Patient;Health care provider  Visit Type Initial;Code;ED  Referral From Care management  Spiritual Encounters  Spiritual Needs Emotional  Stress Factors  Patient Stress Factors Health changes   Geralyn Flash 10/11/2014 10:56 PM

## 2014-10-11 NOTE — ED Notes (Signed)
Pt presents to the department with right sided chest pain with pain radiation to his jaw and right arm. Pt took 324 aspirin & 2 nitroglycerin prior to arrival of EMS. Associated SOB & diaphoresis. Hx of MI. A&O X4.
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
## 2014-10-12 ENCOUNTER — Encounter (HOSPITAL_COMMUNITY): Payer: Self-pay | Admitting: Cardiovascular Disease

## 2014-10-12 ENCOUNTER — Inpatient Hospital Stay (HOSPITAL_COMMUNITY): Payer: Medicare Other

## 2014-10-12 DIAGNOSIS — R51 Headache: Secondary | ICD-10-CM | POA: Diagnosis not present

## 2014-10-12 DIAGNOSIS — I6523 Occlusion and stenosis of bilateral carotid arteries: Secondary | ICD-10-CM | POA: Diagnosis not present

## 2014-10-12 DIAGNOSIS — I6522 Occlusion and stenosis of left carotid artery: Secondary | ICD-10-CM | POA: Diagnosis not present

## 2014-10-12 DIAGNOSIS — Z951 Presence of aortocoronary bypass graft: Secondary | ICD-10-CM | POA: Diagnosis not present

## 2014-10-12 DIAGNOSIS — I2510 Atherosclerotic heart disease of native coronary artery without angina pectoris: Secondary | ICD-10-CM | POA: Diagnosis present

## 2014-10-12 DIAGNOSIS — R0602 Shortness of breath: Secondary | ICD-10-CM

## 2014-10-12 DIAGNOSIS — Z8673 Personal history of transient ischemic attack (TIA), and cerebral infarction without residual deficits: Secondary | ICD-10-CM

## 2014-10-12 DIAGNOSIS — I639 Cerebral infarction, unspecified: Secondary | ICD-10-CM | POA: Diagnosis not present

## 2014-10-12 DIAGNOSIS — I2119 ST elevation (STEMI) myocardial infarction involving other coronary artery of inferior wall: Secondary | ICD-10-CM | POA: Diagnosis not present

## 2014-10-12 DIAGNOSIS — Z91041 Radiographic dye allergy status: Secondary | ICD-10-CM | POA: Diagnosis not present

## 2014-10-12 DIAGNOSIS — R079 Chest pain, unspecified: Secondary | ICD-10-CM

## 2014-10-12 DIAGNOSIS — I6521 Occlusion and stenosis of right carotid artery: Secondary | ICD-10-CM | POA: Diagnosis present

## 2014-10-12 DIAGNOSIS — I2111 ST elevation (STEMI) myocardial infarction involving right coronary artery: Secondary | ICD-10-CM | POA: Diagnosis present

## 2014-10-12 DIAGNOSIS — E780 Pure hypercholesterolemia: Secondary | ICD-10-CM | POA: Diagnosis present

## 2014-10-12 DIAGNOSIS — G441 Vascular headache, not elsewhere classified: Secondary | ICD-10-CM | POA: Diagnosis not present

## 2014-10-12 DIAGNOSIS — F17210 Nicotine dependence, cigarettes, uncomplicated: Secondary | ICD-10-CM | POA: Diagnosis present

## 2014-10-12 DIAGNOSIS — I10 Essential (primary) hypertension: Secondary | ICD-10-CM | POA: Diagnosis not present

## 2014-10-12 DIAGNOSIS — E785 Hyperlipidemia, unspecified: Secondary | ICD-10-CM | POA: Diagnosis present

## 2014-10-12 DIAGNOSIS — G47419 Narcolepsy without cataplexy: Secondary | ICD-10-CM | POA: Diagnosis present

## 2014-10-12 DIAGNOSIS — G459 Transient cerebral ischemic attack, unspecified: Secondary | ICD-10-CM | POA: Diagnosis not present

## 2014-10-12 DIAGNOSIS — F4310 Post-traumatic stress disorder, unspecified: Secondary | ICD-10-CM | POA: Diagnosis present

## 2014-10-12 DIAGNOSIS — Z7982 Long term (current) use of aspirin: Secondary | ICD-10-CM | POA: Diagnosis not present

## 2014-10-12 HISTORY — DX: Personal history of transient ischemic attack (TIA), and cerebral infarction without residual deficits: Z86.73

## 2014-10-12 LAB — POCT ACTIVATED CLOTTING TIME
Activated Clotting Time: 104 seconds
Activated Clotting Time: 208 seconds
Activated Clotting Time: 239 seconds

## 2014-10-12 LAB — CBC
HCT: 41.9 % (ref 39.0–52.0)
Hemoglobin: 13.9 g/dL (ref 13.0–17.0)
MCH: 31.4 pg (ref 26.0–34.0)
MCHC: 33.2 g/dL (ref 30.0–36.0)
MCV: 94.6 fL (ref 78.0–100.0)
Platelets: 224 10*3/uL (ref 150–400)
RBC: 4.43 MIL/uL (ref 4.22–5.81)
RDW: 14.2 % (ref 11.5–15.5)
WBC: 9.4 10*3/uL (ref 4.0–10.5)

## 2014-10-12 LAB — BASIC METABOLIC PANEL
Anion gap: 12 (ref 5–15)
Anion gap: 7 (ref 5–15)
BUN: 22 mg/dL — ABNORMAL HIGH (ref 6–20)
BUN: 18 mg/dL (ref 6–20)
CO2: 23 mmol/L (ref 22–32)
CO2: 24 mmol/L (ref 22–32)
Calcium: 9 mg/dL (ref 8.9–10.3)
Calcium: 8.4 mg/dL — ABNORMAL LOW (ref 8.9–10.3)
Chloride: 104 mmol/L (ref 101–111)
Chloride: 108 mmol/L (ref 101–111)
Creatinine, Ser: 0.9 mg/dL (ref 0.61–1.24)
Creatinine, Ser: 0.64 mg/dL (ref 0.61–1.24)
GFR calc Af Amer: >60 mL/min (ref >60)
GFR calc Af Amer: >60 mL/min (ref >60)
GFR calc non Af Amer: >60 mL/min (ref >60)
GFR calc non Af Amer: >60 mL/min (ref >60)
Glucose, Bld: 122 mg/dL — ABNORMAL HIGH (ref 65–99)
Glucose, Bld: 94 mg/dL (ref 65–99)
Potassium: 3.7 mmol/L (ref 3.5–5.1)
Potassium: 3.7 mmol/L (ref 3.5–5.1)
Sodium: 139 mmol/L (ref 135–145)
Sodium: 139 mmol/L (ref 135–145)

## 2014-10-12 LAB — TROPONIN I
Troponin I: 7.25 ng/mL (ref <0.031)
Troponin I: 20.47 ng/mL (ref <0.031)
Troponin I: 18.26 ng/mL (ref <0.031)

## 2014-10-12 LAB — LIPID PANEL
Cholesterol: 167 mg/dL (ref 0–200)
HDL: 43 mg/dL (ref >40)
LDL Cholesterol: 104 mg/dL — ABNORMAL HIGH (ref 0–99)
Total CHOL/HDL Ratio: 3.9 RATIO
Triglycerides: 100 mg/dL (ref <150)
VLDL: 20 mg/dL (ref 0–40)

## 2014-10-12 LAB — PROTIME-INR
INR: 1.08 (ref 0.00–1.49)
Prothrombin Time: 14.2 seconds (ref 11.6–15.2)

## 2014-10-12 LAB — HEPARIN LEVEL (UNFRACTIONATED): Heparin Unfractionated: <0.1 IU/mL — ABNORMAL LOW (ref 0.30–0.70)

## 2014-10-12 LAB — MRSA PCR SCREENING: MRSA by PCR: POSITIVE — AB

## 2014-10-12 LAB — BRAIN NATRIURETIC PEPTIDE: B Natriuretic Peptide: 72.4 pg/mL (ref 0.0–100.0)

## 2014-10-12 MED ORDER — AMPHETAMINE-DEXTROAMPHETAMINE 30 MG PO TABS: 30.0000 mg | ORAL_TABLET | Freq: Every day | ORAL | Status: DC | PRN

## 2014-10-12 MED ORDER — ALPRAZOLAM 0.5 MG PO TABS
1.0000 mg | ORAL_TABLET | Freq: Two times a day (BID) | ORAL | Status: DC | PRN
  Administered 2014-10-12 – 2014-10-13 (×3): 1 mg via ORAL
  Filled 2014-10-12 (×3): qty 2

## 2014-10-12 MED ORDER — TIROFIBAN HCL IV 5 MG/100ML
0.1500 ug/kg/min | Freq: Once | INTRAVENOUS | Status: AC
  Administered 2014-10-12: 0.15 ug/kg/min via INTRAVENOUS

## 2014-10-12 MED ORDER — LABETALOL HCL 5 MG/ML IV SOLN
10.0000 mg | INTRAVENOUS | Status: DC | PRN
  Administered 2014-10-12 (×2): 10 mg via INTRAVENOUS
  Filled 2014-10-12 (×2): qty 4

## 2014-10-12 MED ORDER — SODIUM CHLORIDE 0.9 % IV SOLN
INTRAVENOUS | Status: AC
  Administered 2014-10-12: 04:00:00 via INTRAVENOUS

## 2014-10-12 MED ORDER — OXYCODONE-ACETAMINOPHEN 5-325 MG PO TABS
1.0000 | ORAL_TABLET | Freq: Four times a day (QID) | ORAL | Status: DC | PRN
  Administered 2014-10-12 (×2): 2 via ORAL
  Filled 2014-10-12 (×2): qty 2

## 2014-10-12 MED ORDER — ASPIRIN EC 81 MG PO TBEC
81.0000 mg | DELAYED_RELEASE_TABLET | Freq: Every day | ORAL | Status: DC
Start: 2014-10-12 — End: 2014-10-14
  Administered 2014-10-12 – 2014-10-14 (×2): 81 mg via ORAL
  Filled 2014-10-12 (×3): qty 1

## 2014-10-12 MED ORDER — HYDROMORPHONE HCL 1 MG/ML IJ SOLN
1.0000 mg | Freq: Once | INTRAMUSCULAR | Status: AC
  Administered 2014-10-12: 1 mg via INTRAVENOUS
  Filled 2014-10-12: qty 1

## 2014-10-12 MED ORDER — TEMAZEPAM 15 MG PO CAPS: 15.0000 mg | ORAL_CAPSULE | Freq: Every evening | ORAL | Status: DC | PRN

## 2014-10-12 MED ORDER — SODIUM CHLORIDE 0.9 % IV SOLN
250.0000 mL | INTRAVENOUS | Status: DC | PRN
  Administered 2014-10-13: 250 mL via INTRAVENOUS

## 2014-10-12 MED ORDER — METOPROLOL TARTRATE 25 MG PO TABS
25.0000 mg | ORAL_TABLET | Freq: Two times a day (BID) | ORAL | Status: DC
  Administered 2014-10-12 – 2014-10-14 (×5): 25 mg via ORAL
  Filled 2014-10-12 (×6): qty 1

## 2014-10-12 MED ORDER — TIROFIBAN HCL IV 12.5 MG/250 ML
INTRAVENOUS | Status: DC | PRN
  Administered 2014-10-12: 0.15 ug/kg/min via INTRAVENOUS

## 2014-10-12 MED ORDER — PNEUMOCOCCAL VAC POLYVALENT 25 MCG/0.5ML IJ INJ
0.5000 mL | INJECTION | INTRAMUSCULAR | Status: AC
  Administered 2014-10-14: 0.5 mL via INTRAMUSCULAR
  Filled 2014-10-12: qty 0.5

## 2014-10-12 MED ORDER — SODIUM CHLORIDE 0.9 % IV SOLN
INTRAVENOUS | Status: DC
Start: 2014-10-12 — End: 2014-10-12

## 2014-10-12 MED ORDER — NITROGLYCERIN 0.4 MG SL SUBL: 0.4000 mg | SUBLINGUAL_TABLET | SUBLINGUAL | Status: DC | PRN

## 2014-10-12 MED ORDER — MUPIROCIN 2 % EX OINT
1.0000 "application " | TOPICAL_OINTMENT | Freq: Two times a day (BID) | CUTANEOUS | Status: DC
  Administered 2014-10-12 – 2014-10-14 (×5): 1 via NASAL
  Filled 2014-10-12: qty 22

## 2014-10-12 MED ORDER — ONDANSETRON HCL 4 MG/2ML IJ SOLN: 4.0000 mg | Freq: Four times a day (QID) | INTRAMUSCULAR | Status: DC | PRN

## 2014-10-12 MED ORDER — ASPIRIN EC 81 MG PO TBEC: 81.0000 mg | DELAYED_RELEASE_TABLET | Freq: Every day | ORAL | Status: DC

## 2014-10-12 MED ORDER — TRAMADOL HCL 50 MG PO TABS: 50.0000 mg | ORAL_TABLET | Freq: Four times a day (QID) | ORAL | Status: DC | PRN

## 2014-10-12 MED ORDER — ACETAMINOPHEN 325 MG PO TABS: 650.0000 mg | ORAL_TABLET | ORAL | Status: DC | PRN

## 2014-10-12 MED ORDER — CHLORHEXIDINE GLUCONATE CLOTH 2 % EX PADS
6.0000 | MEDICATED_PAD | Freq: Every day | CUTANEOUS | Status: DC
  Administered 2014-10-12 – 2014-10-14 (×2): 6 via TOPICAL

## 2014-10-12 MED ORDER — TIROFIBAN HCL IV 5 MG/100ML
0.1500 ug/kg/min | INTRAVENOUS | Status: DC
  Administered 2014-10-12 – 2014-10-13 (×4): 0.15 ug/kg/min via INTRAVENOUS
  Filled 2014-10-12 (×8): qty 100

## 2014-10-12 MED ORDER — HEPARIN (PORCINE) IN NACL 100-0.45 UNIT/ML-% IJ SOLN
1400.0000 [IU]/h | INTRAMUSCULAR | Status: DC
  Administered 2014-10-13: 1400 [IU]/h via INTRAVENOUS
  Filled 2014-10-12 (×3): qty 250

## 2014-10-12 MED ORDER — PERFLUTREN LIPID MICROSPHERE
INTRAVENOUS | Status: AC
  Filled 2014-10-12: qty 10

## 2014-10-12 MED ORDER — ATROPINE SULFATE 0.1 MG/ML IJ SOLN
INTRAMUSCULAR | Status: AC
  Filled 2014-10-12: qty 10

## 2014-10-12 MED ORDER — NITROGLYCERIN IN D5W 200-5 MCG/ML-% IV SOLN
0.0000 ug/min | INTRAVENOUS | Status: DC
  Administered 2014-10-12: 5 ug/min via INTRAVENOUS
  Filled 2014-10-12: qty 250

## 2014-10-12 MED ORDER — SODIUM CHLORIDE 0.9 % IJ SOLN: 3.0000 mL | INTRAMUSCULAR | Status: DC | PRN

## 2014-10-12 MED ORDER — CHLORHEXIDINE GLUCONATE CLOTH 2 % EX PADS: 6.0000 | MEDICATED_PAD | Freq: Every day | CUTANEOUS | Status: DC

## 2014-10-12 MED ORDER — ASPIRIN 325 MG PO TABS
325.0000 mg | ORAL_TABLET | Freq: Every day | ORAL | Status: DC
  Filled 2014-10-12: qty 1

## 2014-10-12 MED ORDER — CETYLPYRIDINIUM CHLORIDE 0.05 % MT LIQD
7.0000 mL | Freq: Two times a day (BID) | OROMUCOSAL | Status: DC
  Administered 2014-10-12 – 2014-10-14 (×2): 7 mL via OROMUCOSAL

## 2014-10-12 MED ORDER — AMPHETAMINE-DEXTROAMPHETAMINE 10 MG PO TABS: 30.0000 mg | ORAL_TABLET | Freq: Every day | ORAL | Status: DC | PRN

## 2014-10-12 MED ORDER — LISINOPRIL 20 MG PO TABS
20.0000 mg | ORAL_TABLET | Freq: Every day | ORAL | Status: DC
  Administered 2014-10-12 – 2014-10-14 (×3): 20 mg via ORAL
  Filled 2014-10-12 (×3): qty 1

## 2014-10-12 MED ORDER — PERFLUTREN LIPID MICROSPHERE
1.0000 mL | INTRAVENOUS | Status: AC | PRN
  Administered 2014-10-12: 2 mL via INTRAVENOUS
  Filled 2014-10-12: qty 10

## 2014-10-12 MED ORDER — METHYLPHENIDATE HCL 5 MG PO TABS
20.0000 mg | ORAL_TABLET | Freq: Three times a day (TID) | ORAL | Status: DC
  Filled 2014-10-12: qty 4

## 2014-10-12 MED ORDER — IOHEXOL 350 MG/ML SOLN
INTRAVENOUS | Status: DC | PRN
  Administered 2014-10-12: 250 mL via INTRA_ARTERIAL

## 2014-10-12 MED ORDER — SODIUM CHLORIDE 0.9 % IJ SOLN
3.0000 mL | Freq: Two times a day (BID) | INTRAMUSCULAR | Status: DC
  Administered 2014-10-12 – 2014-10-14 (×5): 3 mL via INTRAVENOUS

## 2014-10-12 MED FILL — Heparin Sodium (Porcine) 2 Unit/ML in Sodium Chloride 0.9%: INTRAMUSCULAR | Qty: 1000 | Status: AC

## 2014-10-12 MED FILL — Lidocaine HCl Local Preservative Free (PF) Inj 1%: INTRAMUSCULAR | Qty: 30 | Status: AC

## 2014-10-12 NOTE — Progress Notes (Signed)
Right femoral artery sheath removed. Pressure held for 20 minutes. Site clotted with NO rebleeding, hematoma, or bruising. Site soft and Level 0. Pressure dressing applied. Patient on bed rest. Educated patient on post- sheath removal care and bedrest protocol.  Will continue to monitor.

## 2014-10-12 NOTE — Progress Notes (Signed)
Patient Name: Ivan [name] Date of Encounter: 10/12/2014  Active Problems:   STEMI (ST elevation myocardial infarction)   ST elevation myocardial infarction (STEMI) involving right coronary artery in recovery phase   Length of Stay: 0  SUBJECTIVE  The patient is chest pain free and comfortable.   CURRENT MEDS . antiseptic oral rinse  7 mL Mouth Rinse BID  . aspirin EC  81 mg Oral Daily  . Chlorhexidine Gluconate Cloth  6 each Topical Q0600  . lisinopril  20 mg Oral Daily  . methylphenidate  20 mg Oral TID WC  . metoprolol tartrate  25 mg Oral BID  . mupirocin ointment  1 application Nasal BID  . [START ON 10/13/2014] pneumococcal 23 valent vaccine  0.5 mL Intramuscular Tomorrow-1000  . sodium chloride  3 mL Intravenous Q12H   . heparin    . nitroGLYCERIN 15 mcg/min (10/12/14 0530)  . tirofiban 0.15 mcg/kg/min (10/12/14 0340)   OBJECTIVE  Filed Vitals:   10/12/14 0600 10/12/14 0630 10/12/14 0700 10/12/14 0730  BP: 130/79 120/84 121/73 122/77  Pulse: 64 64 68 62  Temp:    97.5 F (36.4 C)  TempSrc:    Oral  Resp: 11 15 11 16   Height:      Weight:      SpO2: 95% 96% 98% 98%    Intake/Output Summary (Last 24 hours) at 10/12/14 0925 Last data filed at 10/12/14 0700  Gross per 24 hour  Intake 559.87 ml  Output   1275 ml  Net -715.13 ml   Filed Weights   10/11/14 2239  Weight: 182 lb (82.555 kg)   PHYSICAL EXAM  General: Pleasant, NAD. Neuro: Alert and oriented X 3. Moves all extremities spontaneously. Psych: Normal affect. HEENT:  Normal  Neck: Supple without bruits or JVD. Lungs:  Resp regular and unlabored, CTA. Heart: RRR no s3, s4, or murmurs. Abdomen: Soft, non-tender, non-distended, BS + x 4.  Extremities: No clubbing, cyanosis or edema. DP/PT/Radials 2+ and equal bilaterally.  Accessory Clinical Findings  CBC  Recent Labs  10/11/14 2242 10/12/14 0815  WBC 11.2* 9.4  HGB 15.7 13.9  HCT 45.9 41.9  MCV 94.1 94.6  PLT 237 017   Basic  Metabolic Panel  Recent Labs  10/11/14 2242  NA 139  K 3.7  CL 104  CO2 23  GLUCOSE 122*  BUN 22*  CREATININE 0.90  CALCIUM 9.0    Recent Labs  10/12/14 0330  TROPONINI 7.25*    Recent Labs  10/12/14 0330  CHOL 167  HDL 43  LDLCALC 104*  TRIG 100  CHOLHDL 3.9   Radiology/Studies  Dg Shoulder Right  09/16/2014   CLINICAL DATA:  Twisted arm.  Initial evaluation.  EXAM: RIGHT SHOULDER - 2+ VIEW  COMPARISON:  None.  FINDINGS: There is no evidence of fracture or dislocation. There is no evidence of arthropathy or other focal bone abnormality. Soft tissues are unremarkable. Prior cervical spine fusion. Prior median sternotomy.  IMPRESSION: No acute abnormality.  No evidence of fracture or dislocation.   Electronically Signed   By: Marcello Moores  Register   On: 09/16/2014 15:20   Dg Chest Port 1 View  10/11/2014   CLINICAL DATA:  Code ST-elevation MI.  Severe mid chest pain.  EXAM: PORTABLE CHEST - 1 VIEW  COMPARISON:  01/26/2014  FINDINGS: Patient is post median sternotomy. The heart size is at the upper limits of normal, mild tortuosity of the thoracic aorta. No pulmonary edema, consolidation, pleural effusion  or pneumothorax. No acute osseous abnormalities.  IMPRESSION: Post CABG.  No active disease.   Electronically Signed   By: Jeb Levering M.D.   On: 10/11/2014 23:24   TELE: SR  ECG: SR, LAFB, resolved STE in the inferolateral leads and reciprocal changes in the anterior leads    ASSESSMENT AND PLAN   65M smoker with HTN, HLD, narcolepsy, PTSD, and CAD s/p 4v CABG (2005, LIMA to LAD, RIMA to RCA, SVG to D1, SVG to OM1) who presents with inferior STEMI. Cath by Dr Claiborne Billings with 80-90% in PDA and transient stenosis in PLA.He was started on heparin drip and aggrastat drip. Max tropoin 7.2. His STE in the inferolateral leads and reciprocal changes in the anterior leads have resolved. His cath images and case was discussed in length with Dr Angelena Form and Dr Claiborne Billings. It was decided  that we will continue aggrastat and heparin for another day and re-cath him tomorrow.  The patient has a known intolerance to at least 4 statins - with severe muscle and join pain. We will refer him to the lipid clinic for PC-SK 9 inhibitors initiation post discharge.  For now continue ASA, lisinopril, metoprolol.   Signed, Dorothy Spark MD, [facility] 10/12/2014

## 2014-10-12 NOTE — Progress Notes (Signed)
Discussed MI, diet and smoking cessation with pt. He voiced understanding but was also sleepy. Will f/u and reinforce.  [place], ACSM 12:22 PM 10/12/2014

## 2014-10-12 NOTE — Care Management Note (Signed)
Case Management Note  Patient Details  Name: Ivan [name] MRN: [id number] Date of Birth: Nov 07, 1945  Subjective/Objective:           Adm w stemi         Action/Plan:  Lives w fam, pcp dr Sherrie Mustache   Expected Discharge Date:      10/15/14            Expected Discharge Plan:  Home/Self Care  In-House Referral:     Discharge planning Services     Post Acute Care Choice:    Choice offered to:     DME Arranged:    DME Agency:     HH Arranged:    [place] Agency:     Status of Service:     Medicare Important Message Given:    Date Medicare IM Given:    Medicare IM give by:    Date Additional Medicare IM Given:    Additional Medicare Important Message give by:     If discussed at [place] Meetings, dates discussed:    Additional Comments: ur review done  Lacretia Leigh, RN 10/12/2014, 10:18 AM

## 2014-10-12 NOTE — Progress Notes (Signed)
  Echocardiogram 2D Echocardiogram with Definity has been performed.  Diamond Nickel 10/12/2014, 12:59 PM
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
## 2014-10-12 NOTE — Plan of Care (Signed)
Problem: Phase I Progression Outcomes Goal: Aspirin unless contraindicated Outcome: Not Applicable Date Met:  48/18/59 Given by EMS

## 2014-10-12 NOTE — Progress Notes (Signed)
eLink Physician-Brief Progress Note Patient Name: Ivan [name] DOB: [birth year] MRN: [id number]   Date of Service  10/12/2014  HPI/Events of Note  69 yo male with PMH of HTN, Hyperlipidemia, CAD - s/p MI and Narcolepsy. Admitted to ICU s/p cardiac cath lab and presumed PCI for STEMI. There is no H&P or cardiac cath lab note in the chart at this time.  Information is limited. Current medical regimen includes ASA, Lisinopril and Metoprolol. Management per Cardiology.   eICU Interventions  Continue present management.      Intervention Category Evaluation Type: New Patient Evaluation  [name] 10/12/2014, 1:42 AM

## 2014-10-12 NOTE — Progress Notes (Signed)
ANTICOAGULATION CONSULT NOTE - Follow Up Consult  Pharmacy Consult for Heparin Indication: chest pain/ACS  Allergies  Allergen Reactions  . Statins     Leg cramps, N&V    Patient Measurements: Height: 5\' 11"  (180.3 cm) Weight: 182 lb (82.555 kg) IBW/kg (Calculated) : 75.3 Heparin Dosing Weight: 82 kg  Vital Signs: Temp: 98.4 F (36.9 C) (06/01 2000) Temp Source: Oral (06/01 2000) BP: 104/53 mmHg (06/01 2000) Pulse Rate: 61 (06/01 1600)  Labs:  Recent Labs  10/11/14 2242 10/12/14 0330 10/12/14 0815 10/12/14 1445 10/12/14 2000  HGB 15.7  --  13.9  --   --   HCT 45.9  --  41.9  --   --   PLT 237  --  224  --   --   LABPROT 14.5  --  14.2  --   --   INR 1.11  --  1.08  --   --   HEPARINUNFRC  --   --   --   --  <0.10*  CREATININE 0.90  --  0.64  --   --   TROPONINI  --  7.25* 20.47* 18.26*  --     Estimated Creatinine Clearance: 96.7 mL/min (by C-G formula based on Cr of 0.64).   Medications:  Scheduled:  . antiseptic oral rinse  7 mL Mouth Rinse BID  . aspirin EC  81 mg Oral Daily  . Chlorhexidine Gluconate Cloth  6 each Topical Q0600  . lisinopril  20 mg Oral Daily  . metoprolol tartrate  25 mg Oral BID  . mupirocin ointment  1 application Nasal BID  . perflutren lipid microspheres (DEFINITY) IV suspension      . [START ON 10/13/2014] pneumococcal 23 valent vaccine  0.5 mL Intramuscular Tomorrow-1000  . sodium chloride  3 mL Intravenous Q12H   Infusions:  . heparin 1,150 Units/hr (10/12/14 1900)  . nitroGLYCERIN 15 mcg/min (10/12/14 1900)  . tirofiban 0.15 mcg/kg/min (10/12/14 1900)    Assessment: 69 yo M with admitted with STEMI and found to have CAD.  Pt was started on heparin and tirofiban post-cath with plans for repeat cardiac cath 6/2.  Heparin level is undetectable on current rate.  No IV issues noted.  Will increase accordingly.  Goal of Therapy:  Heparin level 0.3-0.5 units/ml Monitor platelets by anticoagulation protocol: Yes   Plan:   Increase heparin to 1400 units/hr. Next heparin level with AM labs. Follow-up timing of cardiac cath.  [employer], Pharm.D., BCPS Clinical Pharmacist Pager [phone] 10/12/2014 10:14 PM

## 2014-10-13 ENCOUNTER — Inpatient Hospital Stay (HOSPITAL_COMMUNITY): Payer: Medicare Other

## 2014-10-13 ENCOUNTER — Encounter (HOSPITAL_COMMUNITY)
Admission: EM | Disposition: A | Discharge status: Home / Self Care | Payer: Medicare Other | Source: Home / Self Care | Attending: Cardiovascular Disease

## 2014-10-13 DIAGNOSIS — I10 Essential (primary) hypertension: Secondary | ICD-10-CM

## 2014-10-13 DIAGNOSIS — E785 Hyperlipidemia, unspecified: Secondary | ICD-10-CM | POA: Insufficient documentation

## 2014-10-13 DIAGNOSIS — G459 Transient cerebral ischemic attack, unspecified: Secondary | ICD-10-CM | POA: Insufficient documentation

## 2014-10-13 DIAGNOSIS — R0602 Shortness of breath: Secondary | ICD-10-CM | POA: Insufficient documentation

## 2014-10-13 DIAGNOSIS — I639 Cerebral infarction, unspecified: Secondary | ICD-10-CM | POA: Insufficient documentation

## 2014-10-13 DIAGNOSIS — I8290 Acute embolism and thrombosis of unspecified vein: Secondary | ICD-10-CM | POA: Insufficient documentation

## 2014-10-13 HISTORY — PX: CARDIAC CATHETERIZATION: SHX172

## 2014-10-13 LAB — CBC
HCT: 40.4 % (ref 39.0–52.0)
HCT: 40.4 % (ref 39.0–52.0)
Hemoglobin: 13.3 g/dL (ref 13.0–17.0)
Hemoglobin: 13.9 g/dL (ref 13.0–17.0)
MCH: 31.3 pg (ref 26.0–34.0)
MCH: 32.6 pg (ref 26.0–34.0)
MCHC: 32.9 g/dL (ref 30.0–36.0)
MCHC: 34.4 g/dL (ref 30.0–36.0)
MCV: 95.1 fL (ref 78.0–100.0)
MCV: 94.6 fL (ref 78.0–100.0)
Platelets: 224 10*3/uL (ref 150–400)
Platelets: 195 10*3/uL (ref 150–400)
RBC: 4.25 MIL/uL (ref 4.22–5.81)
RBC: 4.27 MIL/uL (ref 4.22–5.81)
RDW: 14.3 % (ref 11.5–15.5)
RDW: 14.3 % (ref 11.5–15.5)
WBC: 9.6 10*3/uL (ref 4.0–10.5)
WBC: 11.3 10*3/uL — ABNORMAL HIGH (ref 4.0–10.5)

## 2014-10-13 LAB — CREATININE, SERUM
Creatinine, Ser: 0.58 mg/dL — ABNORMAL LOW (ref 0.61–1.24)
GFR calc Af Amer: >60 mL/min (ref >60)
GFR calc non Af Amer: >60 mL/min (ref >60)

## 2014-10-13 LAB — HEPARIN LEVEL (UNFRACTIONATED): Heparin Unfractionated: 0.22 IU/mL — ABNORMAL LOW (ref 0.30–0.70)

## 2014-10-13 LAB — HEMOGLOBIN A1C
Hgb A1c MFr Bld: 5.7 % — ABNORMAL HIGH (ref 4.8–5.6)
Mean Plasma Glucose: 117 mg/dL

## 2014-10-13 SURGERY — LEFT HEART CATH AND CORS/GRAFTS ANGIOGRAPHY

## 2014-10-13 MED ORDER — IOHEXOL 350 MG/ML SOLN
50.0000 mL | Freq: Once | INTRAVENOUS | Status: AC | PRN
  Administered 2014-10-13: 50 mL via INTRAVENOUS

## 2014-10-13 MED ORDER — MIDAZOLAM HCL 2 MG/2ML IJ SOLN
INTRAMUSCULAR | Status: DC | PRN
  Administered 2014-10-13 (×2): 1 mg via INTRAVENOUS

## 2014-10-13 MED ORDER — SODIUM CHLORIDE 0.9 % WEIGHT BASED INFUSION: 3.0000 mL/kg/h | INTRAVENOUS | Status: AC

## 2014-10-13 MED ORDER — HEPARIN (PORCINE) IN NACL 2-0.9 UNIT/ML-% IJ SOLN
INTRAMUSCULAR | Status: AC
  Filled 2014-10-13: qty 1500

## 2014-10-13 MED ORDER — TICAGRELOR 90 MG PO TABS
90.0000 mg | ORAL_TABLET | Freq: Two times a day (BID) | ORAL | Status: DC
  Administered 2014-10-13 – 2014-10-14 (×3): 90 mg via ORAL
  Filled 2014-10-13 (×4): qty 1

## 2014-10-13 MED ORDER — FENTANYL CITRATE (PF) 100 MCG/2ML IJ SOLN
INTRAMUSCULAR | Status: DC | PRN
  Administered 2014-10-13 (×2): 25 ug via INTRAVENOUS

## 2014-10-13 MED ORDER — SODIUM CHLORIDE 0.9 % IJ SOLN
3.0000 mL | Freq: Two times a day (BID) | INTRAMUSCULAR | Status: DC
  Administered 2014-10-13 – 2014-10-14 (×2): 3 mL via INTRAVENOUS

## 2014-10-13 MED ORDER — SODIUM CHLORIDE 0.9 % WEIGHT BASED INFUSION
3.0000 mL/kg/h | INTRAVENOUS | Status: DC
  Administered 2014-10-13: 3 mL/kg/h via INTRAVENOUS

## 2014-10-13 MED ORDER — TICAGRELOR 90 MG PO TABS
ORAL_TABLET | ORAL | Status: AC
  Filled 2014-10-13: qty 1

## 2014-10-13 MED ORDER — SODIUM CHLORIDE 0.9 % IJ SOLN: 3.0000 mL | INTRAMUSCULAR | Status: DC | PRN

## 2014-10-13 MED ORDER — TICAGRELOR 90 MG PO TABS
ORAL_TABLET | ORAL | Status: DC | PRN
  Administered 2014-10-13: 180 mg via ORAL

## 2014-10-13 MED ORDER — SODIUM CHLORIDE 0.9 % IV SOLN: 250.0000 mL | INTRAVENOUS | Status: DC | PRN

## 2014-10-13 MED ORDER — TIROFIBAN HCL IV 5 MG/100ML: 0.1500 ug/kg/min | INTRAVENOUS | Status: DC

## 2014-10-13 MED ORDER — SODIUM CHLORIDE 0.9 % IV BOLUS (SEPSIS)
500.0000 mL | Freq: Once | INTRAVENOUS | Status: AC
  Administered 2014-10-13: 500 mL via INTRAVENOUS

## 2014-10-13 MED ORDER — LIDOCAINE IN D5W 4-5 MG/ML-% IV SOLN
INTRAVENOUS | Status: AC
  Filled 2014-10-13: qty 500

## 2014-10-13 MED ORDER — IOHEXOL 350 MG/ML SOLN
INTRAVENOUS | Status: DC | PRN
  Administered 2014-10-13: 80 mL via INTRA_ARTICULAR

## 2014-10-13 MED ORDER — TICAGRELOR 90 MG PO TABS: 90.0000 mg | ORAL_TABLET | Freq: Every day | ORAL | Status: DC

## 2014-10-13 MED ORDER — HEPARIN SODIUM (PORCINE) 1000 UNIT/ML IJ SOLN
INTRAMUSCULAR | Status: DC | PRN
  Administered 2014-10-13: 3000 [IU] via INTRAVENOUS
  Administered 2014-10-13: 4000 [IU] via INTRAVENOUS

## 2014-10-13 MED ORDER — HEPARIN SODIUM (PORCINE) 1000 UNIT/ML IJ SOLN
INTRAMUSCULAR | Status: AC
  Filled 2014-10-13: qty 1

## 2014-10-13 MED ORDER — VERAPAMIL HCL 2.5 MG/ML IV SOLN
INTRAVENOUS | Status: AC
  Filled 2014-10-13: qty 2

## 2014-10-13 MED ORDER — HEPARIN SODIUM (PORCINE) 5000 UNIT/ML IJ SOLN
5000.0000 [IU] | Freq: Three times a day (TID) | INTRAMUSCULAR | Status: DC
  Administered 2014-10-13 – 2014-10-14 (×2): 5000 [IU] via SUBCUTANEOUS
  Filled 2014-10-13 (×5): qty 1

## 2014-10-13 MED ORDER — SODIUM CHLORIDE 0.9 % IJ SOLN: 3.0000 mL | Freq: Two times a day (BID) | INTRAMUSCULAR | Status: DC

## 2014-10-13 MED ORDER — FENTANYL CITRATE (PF) 100 MCG/2ML IJ SOLN
INTRAMUSCULAR | Status: AC
  Filled 2014-10-13: qty 2

## 2014-10-13 MED ORDER — MIDAZOLAM HCL 2 MG/2ML IJ SOLN
INTRAMUSCULAR | Status: AC
  Filled 2014-10-13: qty 2

## 2014-10-13 MED ORDER — SODIUM CHLORIDE 0.9 % WEIGHT BASED INFUSION: 1.0000 mL/kg/h | INTRAVENOUS | Status: DC

## 2014-10-13 MED ORDER — LIDOCAINE HCL (PF) 1 % IJ SOLN
INTRAMUSCULAR | Status: AC
  Filled 2014-10-13: qty 30

## 2014-10-13 MED ORDER — LABETALOL HCL 5 MG/ML IV SOLN
INTRAVENOUS | Status: AC
  Filled 2014-10-13: qty 4

## 2014-10-13 MED ORDER — LABETALOL HCL 5 MG/ML IV SOLN
INTRAVENOUS | Status: DC | PRN
  Administered 2014-10-13: 5 mg via INTRAVENOUS

## 2014-10-13 MED ORDER — ASPIRIN 81 MG PO CHEW
81.0000 mg | CHEWABLE_TABLET | ORAL | Status: AC
  Administered 2014-10-13: 81 mg via ORAL
  Filled 2014-10-13: qty 1

## 2014-10-13 MED ORDER — NITROGLYCERIN 1 MG/10 ML FOR IR/CATH LAB
INTRA_ARTERIAL | Status: AC
  Filled 2014-10-13: qty 10

## 2014-10-13 MED ORDER — VERAPAMIL HCL 2.5 MG/ML IV SOLN
INTRAVENOUS | Status: DC | PRN
  Administered 2014-10-13: 08:00:00 via INTRA_ARTERIAL

## 2014-10-13 SURGICAL SUPPLY — 14 items
CATH INFINITI 5 FR IM (CATHETERS) ×3 IMPLANT
CATH INFINITI JR4 5F (CATHETERS) ×3 IMPLANT
DEVICE RAD COMP TR BAND LRG (VASCULAR PRODUCTS) ×3 IMPLANT
GLIDESHEATH SLEND A-KIT 6F 22G (SHEATH) ×3 IMPLANT
GUIDE CATH RUNWAY 6FR IM (CATHETERS) ×2 IMPLANT
KIT ENCORE 26 ADVANTAGE (KITS) ×2 IMPLANT
KIT HEART LEFT (KITS) ×3 IMPLANT
PACK CARDIAC CATHETERIZATION (CUSTOM PROCEDURE TRAY) ×3 IMPLANT
SHEATH PINNACLE 5F 10CM (SHEATH) IMPLANT
TRANSDUCER W/STOPCOCK (MISCELLANEOUS) ×3 IMPLANT
TUBING CIL FLEX 10 FLL-RA (TUBING) ×3 IMPLANT
WIRE .035 3MM-J 145CM (WIRE) IMPLANT
WIRE LUGE 182CM (WIRE) ×2 IMPLANT
WIRE SAFE-T 1.5MM-J .035X260CM (WIRE) ×3 IMPLANT

## 2014-10-13 NOTE — CV Procedure (Signed)
   BRIEF CARDIAC CATH NOTE    NAME:  Ivan [name]   MRN: [id number] DOB:  05-21-1945   ADMIT DATE: 10/11/2014  Brief Cardia Catheterization Note:  Indication: 1. RECENT INFERIOR STEMI - Poor visualization of RIMA-RCA anastomosis --> planned Re-Look +/-PCI  Procedures: 1. Catheter Placement for Graft Angiography via Right Radial Artery access - 6 Fr sheath, 5 Fr IMA Catheter  Medications:  2 mg Versed IV; 50 mcg Fentanyl IV  5 mg IV Labetalol  Contrast - 80 ml Radial Cocktail: 5 mg Verapamil, 400 mcg NTG, 2 ml 2% Lidocaine in 10 ml NS  Impression:  Patent RIMA graft to distal RCA with widely patet flow in the previously occluded RPL system, but severe ~95-99% stenosis in the proximal rPDA followed by a post-stenotic aneurysmal dilation in a relatively small (~2 mm vessel)   Hemodynamics:   Central AoP: 152/96 mmHg  Onset of Severe HA shortly after advancing Guide Catheter for PCI.  Recommendations:  Due to onset of Severe HA, the procedure was aborted prior to performing PCI - this began as he was receiving additional Bolus of Heparin for ACT of 200 Sec  A brief neurologic exam was performed indicating intact cranial nerves and handgrip as well as feet movement bilaterally. However due to the concern of him being on full anticoagulation and angiography around the vertebral artery and carotid artery, I felt it best to abort the attempt at [place] to confirm or deny the presence of potentially a hemorrhagic stroke versus acute embolic stroke.  Code stroke was called with plans for stat CT scan of the head.  Full note to follow  Leonie Man, M.D., M.S. Interventional Cardiologist   Pager # [phone]     10/13/2014 8:34 AM

## 2014-10-13 NOTE — Evaluation (Signed)
Physical Therapy Evaluation Patient Details Name: Ivan [name] MRN: [id number] DOB: [birth year] Today's Date: 10/13/2014   History of Present Illness  Ivan [name] is an 69 y.o. male admitted on 10/11/2014 with acute STEMI, and history of myocardial infarction as well as CABG, hypertension and hyperlipidemia, who experienced sudden severe headache while undergoing cardiac catheterization. He did not experience any focal deficits acutely. Catheterization procedure was immediately suspended. CT scan of his head showed no clear acute intracranial abnormality including no hemorrhage.  Clinical Impression  Patient presents without focal deficits in left LE coordination or strength or with any balance deficits.  No further skilled PT needs at this time.  Defer to cardiac rehab for follow up needs.    Follow Up Recommendations No PT follow up;Other (comment) (may benefit from outpatient cardiac rehab)    Equipment Recommendations  None recommended by PT    Recommendations for Other Services       Precautions / Restrictions Precautions Precautions: None      Mobility  Bed Mobility               General bed mobility comments: up in chair  Transfers Overall transfer level: Independent                  Ambulation/Gait Ambulation/Gait assistance: Independent Ambulation Distance (Feet): 220 Feet   Gait Pattern/deviations: Step-through pattern;WFL(Within Functional Limits)   Gait velocity interpretation: at or above normal speed for age/gender    Stairs            Wheelchair Mobility    Modified Rankin (Stroke Patients Only) Modified Rankin (Stroke Patients Only) Pre-Morbid Rankin Score: No symptoms Modified Rankin: Slight disability     Balance                                 Standardized Balance Assessment Standardized Balance Assessment : Dynamic Gait Index   Dynamic Gait Index Level Surface: Normal Change in Gait Speed:  Normal Gait with Horizontal Head Turns: Normal Gait with Vertical Head Turns: Normal Gait and Pivot Turn: Normal Step Over Obstacle: Normal Step Around Obstacles: Normal Steps: Severe Impairment (NT) Total Score: 21       Pertinent Vitals/Pain Pain Assessment: 0-10 Pain Score: 4  Pain Location: right groin  Pain Descriptors / Indicators: Sore Pain Intervention(s): Monitored during session    Home Living Family/patient expects to be discharged to:: Private residence Living Arrangements: Spouse/significant other Available Help at Discharge: Family Type of Home: House Home Access: Stairs to enter Entrance Stairs-Rails: Psychiatric nurse of Steps: 8 Home Layout: Two level;Full bath on main level;Able to live on main level with bedroom/bathroom Home Equipment: None      Prior Function Level of Independence: Independent         Comments: works as Therapist, sports at [employer]        Extremity/Trunk Assessment               Lower Extremity Assessment: Overall WFL for tasks assessed         Communication   Communication: No difficulties  Cognition Arousal/Alertness: Awake/alert Behavior During Therapy: WFL for tasks assessed/performed Overall Cognitive Status: Within Functional Limits for tasks assessed                      General Comments General comments (skin integrity, edema, etc.): Stairs NT due to  ICU monitoring, but DGI without stair score still indicative of low risk for falls.    Exercises        Assessment/Plan    PT Assessment Patent does not need any further PT services  PT Diagnosis Difficulty walking   PT Problem List    PT Treatment Interventions     PT Goals (Current goals can be found in the Care Plan section) Acute Rehab PT Goals PT Goal Formulation: All assessment and education complete, DC therapy    Frequency     Barriers to discharge        Co-evaluation               End  of Session Equipment Utilized During Treatment: Gait belt Activity Tolerance: Patient tolerated treatment well Patient left: in chair;with call bell/phone within reach;with family/visitor present           Time: 8315-1761 PT Time Calculation (min) (ACUTE ONLY): 22 min   Charges:   PT Evaluation $Initial PT Evaluation Tier I: 1 Procedure     PT G Codes:        Ivan [name],Ivan [name] 2014/10/18, 4:18 PM Ivan [name], [place] 10-18-2014

## 2014-10-13 NOTE — Progress Notes (Addendum)
Patient Name: Ivan [name] Date of Encounter: 10/13/2014  Active Problems:   STEMI (ST elevation myocardial infarction)   ST elevation myocardial infarction (STEMI) involving right coronary artery in recovery phase   Thrombus   TIA (transient ischemic attack)   Length of Stay: 1  SUBJECTIVE  The patient is chest pain free, no SOB, he feels uncomfortable as he needs to go to the bathroom. Neurology ordered a bed rest.    CURRENT MEDS . antiseptic oral rinse  7 mL Mouth Rinse BID  . aspirin EC  81 mg Oral Daily  . Chlorhexidine Gluconate Cloth  6 each Topical Q0600  . heparin  5,000 Units Subcutaneous 3 times per day  . lisinopril  20 mg Oral Daily  . metoprolol tartrate  25 mg Oral BID  . mupirocin ointment  1 application Nasal BID  . pneumococcal 23 valent vaccine  0.5 mL Intramuscular Tomorrow-1000  . sodium chloride  3 mL Intravenous Q12H  . sodium chloride  3 mL Intravenous Q12H  . sodium chloride  3 mL Intravenous Q12H   . sodium chloride 1 mL/kg/hr (10/13/14 0745)  . sodium chloride    . nitroGLYCERIN Stopped (10/13/14 0006)   OBJECTIVE  Filed Vitals:   10/13/14 0829 10/13/14 0834 10/13/14 0839 10/13/14 0844  BP: 162/103 167/104 152/96   Pulse: 72 72 65 0  Temp:      TempSrc:      Resp: 12 15 18 4   Height:      Weight:      SpO2: 96% 92% 0% 0%    Intake/Output Summary (Last 24 hours) at 10/13/14 1035 Last data filed at 10/13/14 0800  Gross per 24 hour  Intake 3325.35 ml  Output    850 ml  Net 2475.35 ml   Filed Weights   10/11/14 2239 10/12/14 1230 10/13/14 0634  Weight: 182 lb (82.555 kg) 185 lb 3 oz (84 kg) 190 lb 11.2 oz (86.5 kg)   PHYSICAL EXAM  General: Pleasant, NAD. Neuro: Alert and oriented X 3. Moves all extremities spontaneously. Psych: Normal affect. HEENT:  Normal  Neck: Supple without bruits or JVD. Lungs:  Resp regular and unlabored, CTA. Heart: RRR no s3, s4, or murmurs. Abdomen: Soft, non-tender, non-distended, BS + x 4.    Extremities: No clubbing, cyanosis or edema. DP/PT/Radials 2+ and equal bilaterally.  Accessory Clinical Findings  CBC  Recent Labs  10/12/14 0815 10/13/14 0450  WBC 9.4 9.6  HGB 13.9 13.3  HCT 41.9 40.4  MCV 94.6 95.1  PLT 224 361   Basic Metabolic Panel  Recent Labs  10/11/14 2242 10/12/14 0815  NA 139 139  K 3.7 3.7  CL 104 108  CO2 23 24  GLUCOSE 122* 94  BUN 22* 18  CREATININE 0.90 0.64  CALCIUM 9.0 8.4*    Recent Labs  10/12/14 0330 10/12/14 0815 10/12/14 1445  TROPONINI 7.25* 20.47* 18.26*    Recent Labs  10/12/14 0330  CHOL 167  HDL 43  LDLCALC 104*  TRIG 100  CHOLHDL 3.9   Radiology/Studies  Ct Angio Neck W/cm &/or Wo/cm  10/13/2014   CLINICAL DATA:  Patient developed headache during cardiac catheterization.    IMPRESSION: No flow reducing intracranial or extracranial stenosis is observed.  BILATERAL ICA calcific and soft plaque is observed, non flow reducing. No signs of dissection or impending embolus.  No large vessel intracranial occlusion is present.  No signs of cortical infarction or intracranial hemorrhage are evident.  Findings  discussed with stroke neurologist at 9:32 a.m.     Dg Chest [facility]  10/11/2014   CLINICAL DATA:  Code ST-elevation MI.  Severe mid chest pain.  EXAM: PORTABLE CHEST - 1 VIEW  COMPARISON:  01/26/2014  FINDINGS: Patient is post median sternotomy. The heart size is at the upper limits of normal, mild tortuosity of the thoracic aorta. No pulmonary edema, consolidation, pleural effusion or pneumothorax. No acute osseous abnormalities.  IMPRESSION: Post CABG.  No active disease.     TELE: SR  ECG: SR, LAFB, resolved STE in the inferolateral leads and reciprocal changes in the anterior leads    ASSESSMENT AND PLAN  89M smoker with HTN, HLD, narcolepsy, PTSD, and CAD s/p 4v CABG (2005, LIMA to LAD, RIMA to RCA, SVG to D1, SVG to OM1) who presents with inferior STEMI. Cath by Dr Claiborne Billings with 80-90% in PDA and  transient stenosis in PLA.He was started on heparin drip and aggrastat drip. Max tropoin 7.2. His STE in the inferolateral leads and reciprocal changes in the anterior leads have resolved. His cath images and case was discussed in length with Dr Angelena Form and Dr Claiborne Billings. It was decided that we will continue aggrastat and heparin for another day and re-cath today. He was chest pain free overnight, on Aggrastat and heparin drip, his ST elevations have resolved.  The patient went to the cath and when the PCI was about to start he developed headache. Catheterization procedure was immediately suspended. CT scan of his head showed no clear acute intracranial abnormality including no hemorrhage. CT angiogram of the head and neck showed plaque at the origin of the common carotid as well as at carotid bifurcation. No extracranial nor intracranial occlusion was seen. Neurology was consulted, MRI brain is scheduled, we will add Brilinta to aspirin for dual antiplatelet therapy.  There is no thrombus on CTA, therefore no iv Heparin drip or Aggrastat drip is indicated.  He is currently not showing any signs of focal neurologic deficit.  We will not plan another cath, we will continue medical management for PDA stenosis.  The patient has a known intolerance to at least 4 statins - with severe muscle and join pain. We will refer him to the lipid clinic for PC-SK 9 inhibitors initiation post discharge. For now continue ASA, lisinopril, metoprolol.   Signed, Dorothy Spark MD, [facility] 10/13/2014
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
## 2014-10-13 NOTE — Interval H&P Note (Signed)
History and Physical Interval Note:  10/13/2014 7:48 AM  Ivan [name]  has presented today for surgery, with the diagnosis of RECENT INFERIOR STEMI. The various methods of treatment have been discussed with the patient and family. After consideration of risks, benefits and other options for treatment, the patient has consented to  Procedure(s): Left Heart Cath and Cors/Grafts Angiography (N/A)  with possible Percutaneous Coronary Intervention (of RIMA-RCA) as a surgical intervention .    The patient's history has been reviewed, patient examined, no change in status, stable for surgery.  I have reviewed the patient's chart and labs.  Questions were answered to the patient's satisfaction.     [place], [place] Cath Lab Visit (complete for each Cath Lab visit)  Clinical Evaluation Leading to the Procedure:   ACS: Yes.    Non-ACS:    Anginal Classification: CCS IV  Anti-ischemic medical therapy: Maximal Therapy (2 or more classes of medications)  Non-Invasive Test Results: No non-invasive testing performed  Prior CABG: Previous CABG

## 2014-10-13 NOTE — Progress Notes (Signed)
Code stroke called at [facility].  Patient was in the cath lab, when prior to starting the PCI, was given a bolus of heparin when patient c/o severe headache and procedure stopped.  Dr. Nicole Kindred at bedside, Patient transported to CT scan with monitor.  LSN 0815.  NIHSS 1 for left leg drift.  Patient transported to his room 2 H 02, and report given to RN.  Patient states his headache is better

## 2014-10-13 NOTE — Progress Notes (Signed)
Gave pt 30day card of brilinta.

## 2014-10-13 NOTE — H&P (View-Only) (Signed)
Patient Name: Ivan [name] Date of Encounter: 10/12/2014  Active Problems:   STEMI (ST elevation myocardial infarction)   ST elevation myocardial infarction (STEMI) involving right coronary artery in recovery phase   Length of Stay: 0  SUBJECTIVE  The patient is chest pain free and comfortable.   CURRENT MEDS . antiseptic oral rinse  7 mL Mouth Rinse BID  . aspirin EC  81 mg Oral Daily  . Chlorhexidine Gluconate Cloth  6 each Topical Q0600  . lisinopril  20 mg Oral Daily  . methylphenidate  20 mg Oral TID WC  . metoprolol tartrate  25 mg Oral BID  . mupirocin ointment  1 application Nasal BID  . [START ON 10/13/2014] pneumococcal 23 valent vaccine  0.5 mL Intramuscular Tomorrow-1000  . sodium chloride  3 mL Intravenous Q12H   . heparin    . nitroGLYCERIN 15 mcg/min (10/12/14 0530)  . tirofiban 0.15 mcg/kg/min (10/12/14 0340)   OBJECTIVE  Filed Vitals:   10/12/14 0600 10/12/14 0630 10/12/14 0700 10/12/14 0730  BP: 130/79 120/84 121/73 122/77  Pulse: 64 64 68 62  Temp:    97.5 F (36.4 C)  TempSrc:    Oral  Resp: 11 15 11 16   Height:      Weight:      SpO2: 95% 96% 98% 98%    Intake/Output Summary (Last 24 hours) at 10/12/14 0925 Last data filed at 10/12/14 0700  Gross per 24 hour  Intake 559.87 ml  Output   1275 ml  Net -715.13 ml   Filed Weights   10/11/14 2239  Weight: 182 lb (82.555 kg)   PHYSICAL EXAM  General: Pleasant, NAD. Neuro: Alert and oriented X 3. Moves all extremities spontaneously. Psych: Normal affect. HEENT:  Normal  Neck: Supple without bruits or JVD. Lungs:  Resp regular and unlabored, CTA. Heart: RRR no s3, s4, or murmurs. Abdomen: Soft, non-tender, non-distended, BS + x 4.  Extremities: No clubbing, cyanosis or edema. DP/PT/Radials 2+ and equal bilaterally.  Accessory Clinical Findings  CBC  Recent Labs  10/11/14 2242 10/12/14 0815  WBC 11.2* 9.4  HGB 15.7 13.9  HCT 45.9 41.9  MCV 94.1 94.6  PLT 237 250   Basic  Metabolic Panel  Recent Labs  10/11/14 2242  NA 139  K 3.7  CL 104  CO2 23  GLUCOSE 122*  BUN 22*  CREATININE 0.90  CALCIUM 9.0    Recent Labs  10/12/14 0330  TROPONINI 7.25*    Recent Labs  10/12/14 0330  CHOL 167  HDL 43  LDLCALC 104*  TRIG 100  CHOLHDL 3.9   Radiology/Studies  Dg Shoulder Right  09/16/2014   CLINICAL DATA:  Twisted arm.  Initial evaluation.  EXAM: RIGHT SHOULDER - 2+ VIEW  COMPARISON:  None.  FINDINGS: There is no evidence of fracture or dislocation. There is no evidence of arthropathy or other focal bone abnormality. Soft tissues are unremarkable. Prior cervical spine fusion. Prior median sternotomy.  IMPRESSION: No acute abnormality.  No evidence of fracture or dislocation.   Electronically Signed   By: Marcello Moores  Register   On: 09/16/2014 15:20   Dg Chest Port 1 View  10/11/2014   CLINICAL DATA:  Code ST-elevation MI.  Severe mid chest pain.  EXAM: PORTABLE CHEST - 1 VIEW  COMPARISON:  01/26/2014  FINDINGS: Patient is post median sternotomy. The heart size is at the upper limits of normal, mild tortuosity of the thoracic aorta. No pulmonary edema, consolidation, pleural effusion  or pneumothorax. No acute osseous abnormalities.  IMPRESSION: Post CABG.  No active disease.   Electronically Signed   By: Jeb Levering M.D.   On: 10/11/2014 23:24   TELE: SR  ECG: SR, LAFB, resolved STE in the inferolateral leads and reciprocal changes in the anterior leads    ASSESSMENT AND PLAN   46M smoker with HTN, HLD, narcolepsy, PTSD, and CAD s/p 4v CABG (2005, LIMA to LAD, RIMA to RCA, SVG to D1, SVG to OM1) who presents with inferior STEMI. Cath by Dr Claiborne Billings with 80-90% in PDA and transient stenosis in PLA.He was started on heparin drip and aggrastat drip. Max tropoin 7.2. His STE in the inferolateral leads and reciprocal changes in the anterior leads have resolved. His cath images and case was discussed in length with Dr Angelena Form and Dr Claiborne Billings. It was decided  that we will continue aggrastat and heparin for another day and re-cath him tomorrow.  The patient has a known intolerance to at least 4 statins - with severe muscle and join pain. We will refer him to the lipid clinic for PC-SK 9 inhibitors initiation post discharge.  For now continue ASA, lisinopril, metoprolol.   Signed, Dorothy Spark MD, [facility] 10/12/2014

## 2014-10-14 ENCOUNTER — Encounter (HOSPITAL_COMMUNITY): Payer: Self-pay | Admitting: Cardiology

## 2014-10-14 ENCOUNTER — Telehealth: Payer: Self-pay | Admitting: Nurse Practitioner

## 2014-10-14 ENCOUNTER — Inpatient Hospital Stay (HOSPITAL_COMMUNITY): Payer: Medicare Other

## 2014-10-14 DIAGNOSIS — I2119 ST elevation (STEMI) myocardial infarction involving other coronary artery of inferior wall: Secondary | ICD-10-CM | POA: Diagnosis not present

## 2014-10-14 DIAGNOSIS — G441 Vascular headache, not elsewhere classified: Secondary | ICD-10-CM | POA: Insufficient documentation

## 2014-10-14 LAB — BASIC METABOLIC PANEL
Anion gap: 8 (ref 5–15)
BUN: 11 mg/dL (ref 6–20)
CO2: 24 mmol/L (ref 22–32)
Calcium: 8.5 mg/dL — ABNORMAL LOW (ref 8.9–10.3)
Chloride: 106 mmol/L (ref 101–111)
Creatinine, Ser: 0.58 mg/dL — ABNORMAL LOW (ref 0.61–1.24)
GFR calc Af Amer: >60 mL/min (ref >60)
GFR calc non Af Amer: >60 mL/min (ref >60)
Glucose, Bld: 86 mg/dL (ref 65–99)
Potassium: 3.9 mmol/L (ref 3.5–5.1)
Sodium: 138 mmol/L (ref 135–145)

## 2014-10-14 LAB — CBC
HCT: 40.9 % (ref 39.0–52.0)
Hemoglobin: 13.4 g/dL (ref 13.0–17.0)
MCH: 31.2 pg (ref 26.0–34.0)
MCHC: 32.8 g/dL (ref 30.0–36.0)
MCV: 95.1 fL (ref 78.0–100.0)
Platelets: 199 10*3/uL (ref 150–400)
RBC: 4.3 MIL/uL (ref 4.22–5.81)
RDW: 14.3 % (ref 11.5–15.5)
WBC: 9.8 10*3/uL (ref 4.0–10.5)

## 2014-10-14 LAB — POCT ACTIVATED CLOTTING TIME: Activated Clotting Time: 208 seconds

## 2014-10-14 MED ORDER — TICAGRELOR 90 MG PO TABS: 90.0000 mg | ORAL_TABLET | Freq: Two times a day (BID) | ORAL | Status: DC

## 2014-10-14 MED ORDER — ASPIRIN 81 MG PO TBEC: 81.0000 mg | DELAYED_RELEASE_TABLET | Freq: Every day | ORAL | Status: AC

## 2014-10-14 MED ORDER — TICAGRELOR 90 MG PO TABS
90.0000 mg | ORAL_TABLET | Freq: Two times a day (BID) | ORAL | Status: DC
Start: 2014-10-14 — End: 2015-01-13

## 2014-10-14 MED ORDER — NITROGLYCERIN 0.4 MG SL SUBL: 0.4000 mg | SUBLINGUAL_TABLET | SUBLINGUAL | Status: DC | PRN

## 2014-10-14 MED FILL — Heparin Sodium (Porcine) 2 Unit/ML in Sodium Chloride 0.9%: INTRAMUSCULAR | Qty: 1500 | Status: AC

## 2014-10-14 MED FILL — Lidocaine HCl Local Preservative Free (PF) Inj 1%: INTRAMUSCULAR | Qty: 30 | Status: AC

## 2014-10-14 NOTE — Progress Notes (Signed)
CARDIAC REHAB PHASE I   PRE:  Rate/Rhythm: 69 SR    BP: sitting 125/71    SaO2:   MODE:  Ambulation: 700 ft   POST:  Rate/Rhythm: 79 SR    BP: sitting 145/66     SaO2:   Tolerated well, feels good except right leg is sore. Ed completed with good reception. Not sure pt will quit smoking. Resources given. Interested in [place] and will send referral to [facility].  [phone]   Josephina Shih [place] CES, ACSM 10/14/2014 11:19 AM

## 2014-10-14 NOTE — Progress Notes (Signed)
Addendum - MRI - 3 mm acute infarct affecting the right posterior frontal gyrus at the vertex. - Per radiology. Discussed with Dr. Leonie Man. Questionable small embolic infarct related to cardiac catheterization. The patient is without deficits. Area of infarct would not correlate with headache pain patient experienced.   The patient was informed of the findings. Recommended smoking cessation - he is working on it Recommended PCSK9 inhibitor for LDL greater than 70 and statin allergy - patient is not sure his insurance will cover this medication.  Mikey Bussing PA-C [facility] Pager [phone] 10/14/2014, 11:43 AM  I personally spoke to the patient over the phone and explained the MRI findings and lack of correlation with his right retro-orbital headache and answered questions. Antony Contras, MD

## 2014-10-14 NOTE — Progress Notes (Signed)
STROKE TEAM PROGRESS NOTE   HISTORY Ivan [name] is a 69 y.o. male admitted on 10/11/2014 with acute STEMI, and history of myocardial infarction as well as CABG, hypertension and hyperlipidemia, who experienced sudden severe headache while undergoing cardiac catheterization. He did not experience any focal deficits acutely. Catheterization procedure was immediately suspended. CT scan of his head showed no clear acute intracranial abnormality including no hemorrhage. CT angiogram of the head and neck showed plaque at the origins of right and left ICAs, with 50% or greater stenosis bilaterally. No extracranial nor intracranial occlusion was seen. NIH stroke score was 1 with drift of his left leg.  LSN: 8:15 AM on 10/12/2013 tPA Given: No: On anticoagulation; Minimal deficits. mRankin:    SUBJECTIVE (INTERVAL HISTORY) The patient's wife is at the bedside. The patient stated that his headache only lasted 5-6 minutes during his heart catheterization and has not returned. Dr. Leonie Man explained that his MRI was normal.   OBJECTIVE Temp:  [97.9 F (36.6 C)-98.9 F (37.2 C)] 98.7 F (37.1 C) (06/03 0400) Pulse Rate:  [0-85] 46 (06/03 0300) Cardiac Rhythm:  [-] Normal sinus rhythm (06/03 0400) Resp:  [0-55] 17 (06/02 1235) BP: (114-167)/(48-104) 126/69 mmHg (06/03 0600) SpO2:  [0 %-99 %] 98 % (06/03 0100)  No results for input(s): GLUCAP in the last 168 hours.  Recent Labs Lab 10/11/14 2242 10/12/14 0815 10/13/14 1040 10/14/14 0233  NA 139 139  --  138  K 3.7 3.7  --  3.9  CL 104 108  --  106  CO2 23 24  --  24  GLUCOSE 122* 94  --  86  BUN 22* 18  --  11  CREATININE 0.90 0.64 0.58* 0.58*  CALCIUM 9.0 8.4*  --  8.5*   No results for input(s): AST, ALT, ALKPHOS, BILITOT, PROT, ALBUMIN in the last 168 hours.  Recent Labs Lab 10/11/14 2242 10/12/14 0815 10/13/14 0450 10/13/14 1040 10/14/14 0233  WBC 11.2* 9.4 9.6 11.3* 9.8  HGB 15.7 13.9 13.3 13.9 13.4  HCT 45.9 41.9 40.4  40.4 40.9  MCV 94.1 94.6 95.1 94.6 95.1  PLT 237 224 224 195 199    Recent Labs Lab 10/12/14 0330 10/12/14 0815 10/12/14 1445  TROPONINI 7.25* 20.47* 18.26*    Recent Labs  10/11/14 2242 10/12/14 0815  LABPROT 14.5 14.2  INR 1.11 1.08   No results for input(s): COLORURINE, LABSPEC, PHURINE, GLUCOSEU, HGBUR, BILIRUBINUR, KETONESUR, PROTEINUR, UROBILINOGEN, NITRITE, LEUKOCYTESUR in the last 72 hours.  Invalid input(s): APPERANCEUR     Component Value Date/Time   CHOL 167 10/12/2014 0330   CHOL 187 10/06/2014 0910   TRIG 100 10/12/2014 0330   HDL 43 10/12/2014 0330   HDL 52 10/06/2014 0910   CHOLHDL 3.9 10/12/2014 0330   CHOLHDL 3.6 10/06/2014 0910   VLDL 20 10/12/2014 0330   LDLCALC 104* 10/12/2014 0330   LDLCALC 114* 10/06/2014 0910   Lab Results  Component Value Date   HGBA1C 5.7* 10/12/2014   No results found for: LABOPIA, COCAINSCRNUR, LABBENZ, AMPHETMU, THCU, LABBARB  No results for input(s): ETH in the last 168 hours.  Ct Angio Head and Neck W/cm &/or Wo Cm  10/13/2014    No flow reducing intracranial or extracranial stenosis is observed.  BILATERAL ICA calcific and soft plaque is observed, non flow reducing. No signs of dissection or impending embolus.  No large vessel intracranial occlusion is present.  No signs of cortical infarction or intracranial hemorrhage are evident.  Dg Chest Port 1 View  10/13/2014    No active disease.  Status post CABG.       MRI of the brain without contrast 10/14/2014 3 mm acute infarction affecting a right posterior frontal gyrus at the vertex. No swelling or hemorrhage. No other acute finding. Minimal small vessel change of the hemispheric white matter.   PHYSICAL EXAM Pleasant middle aged caucasian male not in distress. . Afebrile. Head is nontraumatic. Neck is supple without bruit.    Cardiac exam no murmur or gallop. Lungs are clear to auscultation. Distal pulses are well felt. Neurological Exam ;  Awake  Alert  oriented x 3. Normal speech and language.eye movements full without nystagmus.fundi were not visualized. Vision acuity and fields appear normal. Hearing is normal. Palatal movements are normal. Face symmetric. Tongue midline. Normal strength, tone, reflexes and coordination. Normal sensation. Gait deferred.     ASSESSMENT/PLAN Mr. Ivan [name] is a 69 y.o. male with history of coronary artery disease, hypertension, hyperlipidemia, and recent STEMI  presenting with a severe headache, 8/10, well undergoing a heart catheterization. He did not receive IV t-PA due to minimal deficits and anticoagulation.   Questionable Stroke:  Non-dominant infarct. Most likely embolic. No deficits.  Resultant - resolution of deficits (resolution of headache)  MRI - 3 mm acute infarct affecting the right posterior frontal gyrus at the vertex.  MRA - not performed  CTA of the head and neck - no infarct. No flow reducing lesions.  Carotid Doppler  please refer to the CTA of the neck.  2D Echo - EF 60-65%. No cardiac source of emboli identified.  LDL 114  HgbA1c 5.7  SCDsfor VTE prophylaxis  Diet Heart Room service appropriate?: Yes; Fluid consistency:: Thin  aspirin 325 mg orally every day prior to admission, now on aspirin 81 mg orally every day  Patient counseled to be compliant with his antithrombotic medications  Ongoing aggressive stroke risk factor management  Therapy recommendations: No follow-up physical therapy.  Disposition: Pending  Hypertension  Home meds:  Lisinopril and metoprolol  Stable  Hyperlipidemia  Home meds: No lipid lowering medications prior to admission ( history of statin allergy )  LDL 114, goal < 70  Consider PCSK9 inhibitor   Other Stroke Risk Factors  Advanced age  Cigarette smoker, quit smoking advised to stop smoking  ETOH use  Coronary artery disease   Other Active Problems  Recent MI  Other Pertinent  History  Narcolepsy  Posttraumatic stress disorder  Hospital day # [place] PA-C [facility] Pager [phone] 10/14/2014, 11:24 AM I have personally examined this patient, reviewed notes, independently viewed imaging studies, participated in medical decision making and plan of care. I have made any additions or clarifications directly to the above note. Agree with note above. Patient developed transient retro-orbital parietal and right frontal headache during the cardiac catheterization procedure without any focal neurological deficits. MRI scan shows questionable 3 mm right frontal vertex hyperintensity which is not dark on the ADC map and cannot explain his presentation hence it is likely to be a incidental infarct caused by the cardiac catheterization or an artifact. He needs to continue his ongoing cardiac evaluation and check echocardiogram and carotid Dopplers. Continue aspirin for stroke prevention. Discussed with patient,Dr. Meda Coffee and patient's wife and answered questions.  Antony Contras, MD Medical Director [facility] Pager: [phone] 10/14/2014 11:28 AM       To contact Stroke Continuity provider, please refer to  [email or web address] After hours, contact General Neurology

## 2014-10-14 NOTE — Progress Notes (Signed)
Discharge instructions reviewed with pt at this, no further questions. Pt eating lunch and IV removed. Pt to be discharged home with wife and son.

## 2014-10-14 NOTE — Discharge Summary (Signed)
Discharge Summary   Patient ID: Ivan [name],  MRN: [id number], DOB/AGE: 67-Apr-1947 67 y.o.  Admit date: 10/11/2014 Discharge date: 10/14/2014  Primary Care Provider: Sherrie Mustache K Primary Cardiologist: New - Dr. Claiborne Billings  Discharge Diagnoses Principal Problem:   ST elevation myocardial infarction (STEMI) involving right coronary artery in recovery phase Active Problems:   High cholesterol   Hypertension   CAD (coronary artery disease)   Thrombus   TIA (transient ischemic attack)   SOB (shortness of breath)   Stroke   Essential hypertension   Hyperlipidemia   Other vascular headache   Allergies Allergies  Allergen Reactions  . Statins     Leg cramps, N&V    Procedures  Echocardiogram 10/12/2014 LV EF: 60% -  65%  ------------------------------------------------------------------- Indications:   Chest pain 786.51.  ------------------------------------------------------------------- History:  PMH: Narcolepsy. PTSD. Skin cancer. Coronary artery disease. PMH:  Myocardial infarction. Risk factors: Hypertension. Dyslipidemia.  ------------------------------------------------------------------- Study Conclusions  - Left ventricle: The cavity size was normal. Wall thickness was increased in a pattern of moderate LVH. Systolic function was normal. The estimated ejection fraction was in the range of 60% to 65%. Moderate hypokinesis of the apicalanteroseptal myocardium. - Left atrium: The atrium was mildly dilated. - Right atrium: The atrium was mildly dilated.     Cardiac catheterization #1 10/12/2014 1. Ost 1st Diag lesion, 100% stenosed. 2. Mid LAD lesion, 70% stenosed. 3. Mid RCA lesion, 100% stenosed. 4. Prox Graft to Mid Graft lesion, 75% stenosed. 5. Dist Graft lesion, 20% stenosed. 6. Prox Graft lesion, 100% stenosed. 7. Dist RCA lesion, 100% stenosed. 8. Ost RPDA lesion, 85% stenosed. 9. Origin to Dist Graft lesion, 75%  stenosed.  Mild to moderate acute LV dysfunction with an ejection fraction of 45% and evidence for moderate mid to basal inferior hypocontractility.  Native CAD with occlusion of the diagonal vessel near its ostium, 70% LAD stenosis after the first septal perforating artery immediately beyond this diagonal vessel with TIMI 3 flow; left circumflex vessel with high twos marginal 1 vessel with imperative filling due to the vein graft supplying this vessel; total occlusion of the proximal RCA with bridging collaterals to the RV marginal branch.  Diffusely diseased vein graft supplying the obtuse marginal 1 vessel with 80 and 70% proximal stenoses and luminal irregularity in the mid distal portion of the graft but with TIMI-3 flow to this OM1 vessel.  Occluded vein graft, which previously had supplied the diagonal vessel. This appears old.  Atretic LIMA graft which had supplied the LAD but no longer reaches the LAD.  Large RIMA graft with initial documentation of occlusion of the distal RCA immediately beyond the PDA takeoff with TIMI 0-1/2 flow. Initially after several additional injections with spontaneous resolution and normalization of flow with the previous occlusion site, now appearing only 30% narrowed, and evidence for 80-90% ostial PDA stenosis immediately after the graft takeoff and proximal to a large aneurysmally dilated ectatic proximal PDA segment.   RECOMMENDATION:  Angiograms will be reviewed with colleagues. The patient was treated with heparin and Aggrastat during the procedure and had resolution of chest pain, significant improvement in ECG changes, and spontaneous resolution of distal RCA occlusion with resumption of TIMI-3 flow. Consider relook via the right radial artery approach to allow for hopeful selective cannulation into the RIMA graft with potential PCI distally versus medical therapy. Smoking cessation is imperative. P2Y12 therapy was not initiated during this  procedure in the event CABG surgery is an option.    Cardiac  catheterization #2 1. Essentially resolution of 100% stenosis in the right posterior AV groove/PL system with restoration of flow to 3 posterolateral branches residual 30% stenosis noted 2. Persistent proximal RPDA lesion that appears to be more 95-99% stenosis just prior to aneurysmal dilatation. 3. Aborted PCI after onset of severe headache. 4. Possible right a evidence of thrombus in the proximal right carotid. Slight left leg drift on examNeurologic team  The patient clearly has severe lesion in the proximal portion of the RPDA has restoration of flow in the right posterolateral system. He has remained chest pain-free on medical management. In light of the patient suffered possible catheterization related CVA with a thrombus/plaque in the right carotid artery, I don't think that proceeding with further attempted intervention is warranted at this time. After discussing with the neurology team they will likely restart Aggrastat and proceed with MRI of the brain.   At this point I think medical management of his CAD is the best option. This would be very difficult PCI and only probably be limited to PTCA of the RPDA lesion.   Recommendation: 5. Post radial cath care with TR band removal 6. Continue neurologic workup and treatment per neurology 7. Medical management for RPDA lesion    CTA of head and neck 10/13/2014  IMPRESSION: No flow reducing intracranial or extracranial stenosis is observed.  BILATERAL ICA calcific and soft plaque is observed, non flow reducing. No signs of dissection or impending embolus.  No large vessel intracranial occlusion is present.  No signs of cortical infarction or intracranial hemorrhage are evident.  Findings discussed with stroke neurologist at 9:32 a.m.    MRI of head 10/14/2014  IMPRESSION: 3 mm acute infarction affecting a right posterior frontal gyrus at the vertex. No  swelling or hemorrhage.  No other acute finding. Minimal small vessel change of the hemispheric white matter.     Hospital Course  Ivan [name] is a 69 year old male with PMH of HTN, HLD, narcolepsy, PTSD, CAD s/p 4v CABG (2005 LIMA to LAD, RIMA to RCA, SVG to D1, SVG to OM1) who present with inferior STEMI on 10/12/2014. 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 EMS arrived and observed inferolateral ST elevation and code STEMI was activated. On arrival to [facility], he was having 5-6/10 chest pain and was hemodynamically stable. EKG demonstrated normal sinus rhythm with inferolateral and lateral ST elevation with reciprocal depression in aVL, V1 and V2. He underwent urgent cardiac catheterization which showed EF 45%, moderate mid to basal inferior hypokinesis, native CAD with occlusion of diagonal vessel near its ostium, 70% LAD stenosis after first septal perforator, chronically occluded SVG to D1, atretic LIMA to LAD, large RIMA graft with occlusion of the distal RCA immediately beyond the PDA takeoff with TIMI 0-1/2 flow, evidence of 80-90% ostial PDA stenosis immediately after graft takeoff. Cath films was reviewed with interventional cardiology team and staged PCI was planned. He was started on heparin drip and Aggrastat drip. Intraoperatively, his ST elevation in the inferolateral leads and reciprocal changes in the anterior leads have resolved. Serial troponin overnight trended up to 20.47 before trending down. Patient has known intolerance to at least 4 seconds due to severe muscle and joint pain, given the significance of multivessel coronary artery disease, he will need to be referred to lipid clinic for consideration of PCSK9 inhibitor. Echocardiogram was obtained on 10/12/2014 which showed EF 60-65%, moderate hypokinesis of apical anteroseptal myocardium.   He was taken  back to the cath lab on 10/13/2014. During the procedure, patient had acute onset of severe  headache which began as he was receiving additional bolus of heparin for ACT of 200 seconds. The procedure was aborted prior to performing PCI of proximal RPDA stenosis. A brief neurological exam was performed indicating intact cranial nerves and hand grips as well as feet movement bilaterally. However due to concern for anticoagulation and angiography around the vertebral artery and carotid artery, the procedure was aborted in order to rule out hemorrhagic stroke versus acute embolic stroke. Code stroke was called and stat CT of the head performed showed no clear acute intracranial abnormality including no hemorrhage. Neurology consulted. CTA of neck showed plaque in the origin of right and left ICA with 50% or greater stenosis bilaterally. MRI of the brain obtained on 10/14/2014 showed 3 mm acute infarction affecting the right posterior frontal gyrus at the vertex, no swelling or hemorrhage, no other acute finding. Per neurology, this is a questionable 3 mm hyperintensity which is not dark on ADC map and cannot explain his presentation hence it is likely to be an incidental infarct caused by cardiac catheterization or artifact. His symptom is more likely contrast allergy rather than true stroke.  He was seen in the morning of 10/14/2014, at which time he was chest pain-free. He denies any shortness breath. Brilinta has been added to his medical regimen during this hospitalization. He is deemed stable for discharge from cardiology perspective to continue medical therapy of severe CAD and known RPDA residual. No plan to recath him at this time given reaction during cath. I will arrange a ten-day transition of care follow-up as well as lipid clinic follow-up.   Discharge Vitals Blood pressure 145/66, pulse 72, temperature 98.2 F (36.8 C), temperature source Oral, resp. rate 20, height 5\' 11"  (1.803 m), weight 190 lb 11.2 oz (86.5 kg), SpO2 98 %.  Filed Weights   10/11/14 2239 10/12/14 1230 10/13/14 0634   Weight: 182 lb (82.555 kg) 185 lb 3 oz (84 kg) 190 lb 11.2 oz (86.5 kg)    Labs  CBC  Recent Labs  10/13/14 1040 10/14/14 0233  WBC 11.3* 9.8  HGB 13.9 13.4  HCT 40.4 40.9  MCV 94.6 95.1  PLT 195 975   Basic Metabolic Panel  Recent Labs  10/12/14 0815 10/13/14 1040 10/14/14 0233  NA 139  --  138  K 3.7  --  3.9  CL 108  --  106  CO2 24  --  24  GLUCOSE 94  --  86  BUN 18  --  11  CREATININE 0.64 0.58* 0.58*  CALCIUM 8.4*  --  8.5*   Cardiac Enzymes  Recent Labs  10/12/14 0330 10/12/14 0815 10/12/14 1445  TROPONINI 7.25* 20.47* 18.26*   Hemoglobin A1C  Recent Labs  10/12/14 0350  HGBA1C 5.7*   Fasting Lipid Panel  Recent Labs  10/12/14 0330  CHOL 167  HDL 43  LDLCALC 104*  TRIG 100  CHOLHDL 3.9    Disposition  Pt is being discharged home today in good condition.  Follow-up Plans & Appointments      Follow-up Information    Follow up with Truitt Merle, NP On 10/21/2014.   Specialties:  Nurse Practitioner, Interventional Cardiology, Cardiology, Radiology   Why:  8:00am. Transition of Care followup for cardiology   Contact information:   [place]. [address] [place] [address] [phone]       Follow up with [facility]  [place] Office.   Specialty:  Cardiology   Why:  Lipid clinic will contact you to see if you qualify for PCSK 9 inhibitor therapy given your intolerance to statin and significant CAD. Please give [place] a call if you do not hear from [place].   Contact information:   [address], [place] [phone]      Discharge Medications    Medication List    STOP taking these medications        aspirin 325 MG tablet  Replaced by:  aspirin 81 MG EC tablet      TAKE these medications        ALPRAZolam 1 MG tablet  Commonly known as:  XANAX  Take 1 mg by mouth 2 (two) times daily as needed for anxiety.     amphetamine-dextroamphetamine 30 MG tablet  Commonly  known as:  ADDERALL  Take 30 mg by mouth daily as needed (for narcolepsy).     aspirin 81 MG EC tablet  Take 1 tablet (81 mg total) by mouth daily.     lisinopril 20 MG tablet  Commonly known as:  PRINIVIL,ZESTRIL  Take 1 tablet (20 mg total) by mouth daily.     methylphenidate 20 MG tablet  Commonly known as:  RITALIN  Take 20 mg by mouth 2 (two) times daily as needed (for narcolepsy). Take 1 tablet by mouth three times a day as needed     metoprolol tartrate 25 MG tablet  Commonly known as:  LOPRESSOR  Take 1 tablet (25 mg total) by mouth 2 (two) times daily.     nitroGLYCERIN 0.4 MG SL tablet  Commonly known as:  NITROSTAT  Place 1 tablet (0.4 mg total) under the tongue every 5 (five) minutes x 3 doses as needed for chest pain.     oxyCODONE-acetaminophen 5-325 MG per tablet  Commonly known as:  PERCOCET/ROXICET  Take 1-2 tablets by mouth every 6 (six) hours as needed for moderate pain or severe pain. May take 2 tablets PO q 6 hours for severe pain - Do not take with Tylenol as this tablet already contains tylenol     temazepam 15 MG capsule  Commonly known as:  RESTORIL  Take 15 mg by mouth at bedtime as needed for sleep.     ticagrelor 90 MG Tabs tablet  Commonly known as:  BRILINTA  Take 1 tablet (90 mg total) by mouth 2 (two) times daily.     traMADol 50 MG tablet  Commonly known as:  ULTRAM  1-2 tablets every 6 hours as needed for pain, max of 6 tablets in 24 hours         Duration of Discharge Encounter   Greater than 30 minutes including physician time.  Hilbert Corrigan PA-C Pager: [phone] 10/14/2014, 12:02 PM

## 2014-10-14 NOTE — Progress Notes (Signed)
Patient Name: Ivan [name] Date of Encounter: 10/14/2014  Active Problems:   STEMI (ST elevation myocardial infarction)   ST elevation myocardial infarction (STEMI) involving right coronary artery in recovery phase   Thrombus   TIA (transient ischemic attack)   SOB (shortness of breath)   Stroke   Essential hypertension   Hyperlipidemia   Length of Stay: 2  SUBJECTIVE  The patient is chest pain free, no SOB, he has walked with no difficulties.    CURRENT MEDS . antiseptic oral rinse  7 mL Mouth Rinse BID  . aspirin EC  81 mg Oral Daily  . Chlorhexidine Gluconate Cloth  6 each Topical Q0600  . heparin  5,000 Units Subcutaneous 3 times per day  . lisinopril  20 mg Oral Daily  . metoprolol tartrate  25 mg Oral BID  . mupirocin ointment  1 application Nasal BID  . sodium chloride  3 mL Intravenous Q12H  . sodium chloride  3 mL Intravenous Q12H  . ticagrelor  90 mg Oral BID   OBJECTIVE  Filed Vitals:   10/14/14 0600 10/14/14 0754 10/14/14 0800 10/14/14 1031  BP: 126/69  115/77 125/71  Pulse:    72  Temp:  97.4 F (36.3 C)    TempSrc:  Oral    Resp:   20   Height:      Weight:      SpO2:   98%     Intake/Output Summary (Last 24 hours) at 10/14/14 1034 Last data filed at 10/14/14 0800  Gross per 24 hour  Intake 2108.5 ml  Output      0 ml  Net 2108.5 ml   Filed Weights   10/11/14 2239 10/12/14 1230 10/13/14 0634  Weight: 182 lb (82.555 kg) 185 lb 3 oz (84 kg) 190 lb 11.2 oz (86.5 kg)   PHYSICAL EXAM  General: Pleasant, NAD. Neuro: Alert and oriented X 3. Moves all extremities spontaneously. Psych: Normal affect. HEENT:  Normal  Neck: Supple without bruits or JVD. Lungs:  Resp regular and unlabored, CTA. Heart: RRR no s3, s4, or murmurs. Abdomen: Soft, non-tender, non-distended, BS + x 4.  Extremities: No clubbing, cyanosis or edema. DP/PT/Radials 2+ and equal bilaterally.  Accessory Clinical Findings  CBC  Recent Labs  10/13/14 1040  10/14/14 0233  WBC 11.3* 9.8  HGB 13.9 13.4  HCT 40.4 40.9  MCV 94.6 95.1  PLT 195 097   Basic Metabolic Panel  Recent Labs  10/12/14 0815 10/13/14 1040 10/14/14 0233  NA 139  --  138  K 3.7  --  3.9  CL 108  --  106  CO2 24  --  24  GLUCOSE 94  --  86  BUN 18  --  11  CREATININE 0.64 0.58* 0.58*  CALCIUM 8.4*  --  8.5*    Recent Labs  10/12/14 0330 10/12/14 0815 10/12/14 1445  TROPONINI 7.25* 20.47* 18.26*    Recent Labs  10/12/14 0330  CHOL 167  HDL 43  LDLCALC 104*  TRIG 100  CHOLHDL 3.9   Radiology/Studies  Ct Angio Neck W/cm &/or Wo/cm  10/13/2014   CLINICAL DATA:  Patient developed headache during cardiac catheterization.    IMPRESSION: No flow reducing intracranial or extracranial stenosis is observed.  BILATERAL ICA calcific and soft plaque is observed, non flow reducing. No signs of dissection or impending embolus.  No large vessel intracranial occlusion is present.  No signs of cortical infarction or intracranial hemorrhage are evident.  Findings discussed with stroke neurologist at 9:32 a.m.     Dg [facility]  10/11/2014   CLINICAL DATA:  Code ST-elevation MI.  Severe mid chest pain.  EXAM: PORTABLE CHEST - 1 VIEW  COMPARISON:  01/26/2014  FINDINGS: Patient is post median sternotomy. The heart size is at the upper limits of normal, mild tortuosity of the thoracic aorta. No pulmonary edema, consolidation, pleural effusion or pneumothorax. No acute osseous abnormalities.  IMPRESSION: Post CABG.  No active disease.     TELE: SR  ECG: SR, LAFB, resolved STE in the inferolateral leads and reciprocal changes in the anterior leads    ASSESSMENT AND PLAN  53 male smoker with HTN, HLD, narcolepsy, PTSD, and CAD s/p 4v CABG (2005, LIMA to LAD, RIMA to RCA, SVG to D1, SVG to OM1) who presents with inferior STEMI. Cath by Dr Claiborne Billings with 80-90% in PDA and transient stenosis in PLA.He was started on heparin drip and aggrastat drip. Max tropoin 7.2. His STE  in the inferolateral leads and reciprocal changes in the anterior leads have resolved. His cath images and case was discussed in length with Dr Angelena Form and Dr Claiborne Billings. It was decided that we will continue aggrastat and heparin for another day and re-cath today. He was chest pain free overnight, on Aggrastat and heparin drip, his ST elevations have resolved.  The patient went to the cath and when the PCI was about to start he developed headache. Catheterization procedure was immediately suspended. CT scan of his head showed no clear acute intracranial abnormality including no hemorrhage. CT angiogram of the head and neck showed plaque at the origin of the common carotid as well as at carotid bifurcation. No extracranial nor intracranial occlusion was seen. Neurology was consulted, MRI brain is scheduled, we will add Brilinta to aspirin for dual antiplatelet therapy.  There is no thrombus on CTA, therefore no iv Heparin drip or Aggrastat were discontinued. His MRI scan was normal and per neurology this was contrast allergy rather than stroke. TIA with small embolization to the brain is also an option.  He is currently not showing any signs of focal neurologic deficit.  We will not plan another cath, we will continue medical management for PDA stenosis.  The patient has a known intolerance to at least 4 statins - with severe muscle and join pain. We will refer him to the lipid clinic for PC-SK 9 inhibitors initiation post discharge. For now continue ASA, lisinopril, metoprolol.   We will discharge him today, he will need follow up in our clinic within the next 10 days as well as appointment at the lipid clinic. He was advised to avoid working for the next 10 days. (he works as a Marine scientist)  Signed, Dorothy Spark MD, [facility] 10/14/2014

## 2014-10-14 NOTE — Telephone Encounter (Signed)
New message      TCM appt on 10-21-14 with Cecille Rubin per Isaac Laud

## 2014-10-14 NOTE — Plan of Care (Signed)
Problem: Discharge Progression Outcomes Goal: Lipid-lowering therapy prescribed Outcome: Not Met (add Reason) Pt is going to attend a lipid clinic

## 2014-10-17 NOTE — Telephone Encounter (Signed)
TCM call for Dr. Claiborne Billings.  LM for pt to call to schedule appointment for Lipid Clinic with [facility].

## 2014-10-17 NOTE — Telephone Encounter (Signed)
New message   Please have lipid clinic contact patient to workup toward PCSK9 inhibitor as he is intolerant to at least 4 statins. Also has very bad multivessel CAD.        Thanks   Signed,  Almyra Deforest PA  Pager: [phone]

## 2014-10-17 NOTE — Telephone Encounter (Signed)
Left message requesting  call back.

## 2014-10-17 NOTE — Telephone Encounter (Signed)
LMOM for pt.  Will see in [facility] on 6/10 with his appt with Cecille Rubin.

## 2014-10-21 ENCOUNTER — Ambulatory Visit (INDEPENDENT_AMBULATORY_CARE_PROVIDER_SITE_OTHER): Payer: Medicare Other | Admitting: Pharmacist

## 2014-10-21 ENCOUNTER — Encounter: Payer: Self-pay | Admitting: Nurse Practitioner

## 2014-10-21 ENCOUNTER — Ambulatory Visit (INDEPENDENT_AMBULATORY_CARE_PROVIDER_SITE_OTHER): Payer: Medicare Other | Admitting: Nurse Practitioner

## 2014-10-21 VITALS — BP 128/78 | HR 68 | Ht 70.5 in | Wt 192.0 lb

## 2014-10-21 DIAGNOSIS — F17200 Nicotine dependence, unspecified, uncomplicated: Secondary | ICD-10-CM | POA: Diagnosis not present

## 2014-10-21 DIAGNOSIS — I2111 ST elevation (STEMI) myocardial infarction involving right coronary artery: Secondary | ICD-10-CM

## 2014-10-21 DIAGNOSIS — E785 Hyperlipidemia, unspecified: Secondary | ICD-10-CM | POA: Diagnosis not present

## 2014-10-21 DIAGNOSIS — Z9889 Other specified postprocedural states: Secondary | ICD-10-CM

## 2014-10-21 DIAGNOSIS — I10 Essential (primary) hypertension: Secondary | ICD-10-CM

## 2014-10-21 DIAGNOSIS — I2119 ST elevation (STEMI) myocardial infarction involving other coronary artery of inferior wall: Secondary | ICD-10-CM | POA: Diagnosis not present

## 2014-10-21 MED ORDER — ROSUVASTATIN CALCIUM 5 MG PO TABS: 5.0000 mg | ORAL_TABLET | Freq: Every day | ORAL | Status: DC
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
## 2014-10-21 NOTE — Patient Instructions (Addendum)
We will be checking the following labs today - NONE   Medication Instructions:    Continue with your current medicines.   I have samples of Brilinta for you - take twice a day    Testing/Procedures To Be Arranged:  N/A  Follow-Up:   See Dr. Acie Fredrickson in 4 weeks  Lipid clinic today    Other Special Instructions:   Congrats for not smoking - keep up the good work!  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

## 2014-10-21 NOTE — Patient Instructions (Signed)
Start Crestor 5mg  three days of the week.    Call Gay Filler, Pharmacist at [phone] in a few weeks to let me know how you are doing.
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
## 2014-10-31 DIAGNOSIS — D239 Other benign neoplasm of skin, unspecified: Secondary | ICD-10-CM | POA: Diagnosis not present

## 2014-10-31 DIAGNOSIS — L299 Pruritus, unspecified: Secondary | ICD-10-CM | POA: Diagnosis not present

## 2014-11-02 ENCOUNTER — Telehealth: Payer: Self-pay | Admitting: *Deleted

## 2014-11-02 NOTE — Telephone Encounter (Signed)
Faxed cardiac rehab order to [facility]. 

## 2014-11-08 ENCOUNTER — Telehealth (HOSPITAL_COMMUNITY): Payer: Self-pay | Admitting: Cardiac Rehabilitation

## 2014-11-08 NOTE — Telephone Encounter (Signed)
4th phone call to enroll pt in cardiac rehab. Letter mailed to pt home address.

## 2014-11-11 ENCOUNTER — Other Ambulatory Visit: Payer: Self-pay | Admitting: Nurse Practitioner

## 2014-11-11 MED ORDER — ROSUVASTATIN CALCIUM 5 MG PO TABS: 5.0000 mg | ORAL_TABLET | Freq: Every day | ORAL | Status: DC

## 2014-11-17 ENCOUNTER — Ambulatory Visit: Payer: Medicare Other | Admitting: Cardiovascular Disease

## 2014-12-01 ENCOUNTER — Ambulatory Visit: Payer: Medicare Other | Admitting: Nurse Practitioner

## 2014-12-07 ENCOUNTER — Other Ambulatory Visit: Payer: Self-pay

## 2014-12-07 MED ORDER — ROSUVASTATIN CALCIUM 5 MG PO TABS: 5.0000 mg | ORAL_TABLET | Freq: Every day | ORAL | Status: DC

## 2014-12-08 ENCOUNTER — Ambulatory Visit (INDEPENDENT_AMBULATORY_CARE_PROVIDER_SITE_OTHER): Payer: Medicare Other | Admitting: Nurse Practitioner

## 2014-12-08 ENCOUNTER — Encounter: Payer: Self-pay | Admitting: Nurse Practitioner

## 2014-12-08 VITALS — BP 140/72 | HR 75 | Temp 98.8°F | Resp 20 | Ht 71.0 in | Wt 188.2 lb

## 2014-12-08 DIAGNOSIS — I2111 ST elevation (STEMI) myocardial infarction involving right coronary artery: Secondary | ICD-10-CM | POA: Diagnosis not present

## 2014-12-08 DIAGNOSIS — I10 Essential (primary) hypertension: Secondary | ICD-10-CM

## 2014-12-08 DIAGNOSIS — E785 Hyperlipidemia, unspecified: Secondary | ICD-10-CM

## 2014-12-08 DIAGNOSIS — N400 Enlarged prostate without lower urinary tract symptoms: Secondary | ICD-10-CM

## 2014-12-08 DIAGNOSIS — M5136 Other intervertebral disc degeneration, lumbar region: Secondary | ICD-10-CM | POA: Diagnosis not present

## 2014-12-08 MED ORDER — TAMSULOSIN HCL 0.4 MG PO CAPS: 0.4000 mg | ORAL_CAPSULE | Freq: Every day | ORAL | Status: DC
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
## 2014-12-08 NOTE — Patient Instructions (Signed)
Keep up the weight loss, eating better and exercise (when cleared from cardiology) Low sodium diet -- to help with blood pressure reduction

## 2014-12-14 ENCOUNTER — Telehealth (HOSPITAL_COMMUNITY): Payer: Self-pay | Admitting: Cardiac Rehabilitation

## 2014-12-14 NOTE — Telephone Encounter (Signed)
Multiple attempts to contact pt to enroll in outpatient cardiac rehab. Pt did not respond to phone calls or letter mailed.

## 2014-12-15 DIAGNOSIS — L720 Epidermal cyst: Secondary | ICD-10-CM | POA: Diagnosis not present

## 2014-12-29 DIAGNOSIS — Z4802 Encounter for removal of sutures: Secondary | ICD-10-CM | POA: Diagnosis not present

## 2015-01-06 ENCOUNTER — Other Ambulatory Visit: Payer: Self-pay | Admitting: Nurse Practitioner

## 2015-01-09 NOTE — Telephone Encounter (Signed)
Left message on machine for patient to return call when available   

## 2015-01-10 NOTE — Telephone Encounter (Signed)
Called and spoke to patient he said he had not picked the medication up, I called the pharmacy to find out about the request coming from them. I spoke to an  employee of [facility].Asked if they did or did not have the script from August, she said they  already had a prescription ready to disregaurd  this new request.

## 2015-01-10 NOTE — Telephone Encounter (Signed)
LMOM message for the patient to call the office to discuss request for refill on a mediation of his.

## 2015-01-13 ENCOUNTER — Encounter: Payer: Self-pay | Admitting: Cardiovascular Disease

## 2015-01-13 ENCOUNTER — Ambulatory Visit (INDEPENDENT_AMBULATORY_CARE_PROVIDER_SITE_OTHER): Payer: Medicare Other | Admitting: Cardiovascular Disease

## 2015-01-13 VITALS — BP 154/88 | HR 62 | Ht 70.5 in | Wt 195.0 lb

## 2015-01-13 DIAGNOSIS — I2111 ST elevation (STEMI) myocardial infarction involving right coronary artery: Secondary | ICD-10-CM

## 2015-01-13 DIAGNOSIS — I2510 Atherosclerotic heart disease of native coronary artery without angina pectoris: Secondary | ICD-10-CM | POA: Diagnosis not present

## 2015-01-13 DIAGNOSIS — I10 Essential (primary) hypertension: Secondary | ICD-10-CM

## 2015-01-13 LAB — LIPID PANEL
Cholesterol: 140 mg/dL (ref 0–200)
HDL: 58.2 mg/dL (ref >39.00)
LDL Cholesterol: 73 mg/dL (ref 0–99)
NonHDL: 82.2
Total CHOL/HDL Ratio: 2
Triglycerides: 46 mg/dL (ref 0.0–149.0)
VLDL: 9.2 mg/dL (ref 0.0–40.0)

## 2015-01-13 LAB — BASIC METABOLIC PANEL
BUN: 24 mg/dL — ABNORMAL HIGH (ref 6–23)
CO2: 30 mEq/L (ref 19–32)
Calcium: 9.2 mg/dL (ref 8.4–10.5)
Chloride: 107 mEq/L (ref 96–112)
Creatinine, Ser: 0.68 mg/dL (ref 0.40–1.50)
GFR: 123.73 mL/min (ref >60.00)
Glucose, Bld: 105 mg/dL — ABNORMAL HIGH (ref 70–99)
Potassium: 4.7 mEq/L (ref 3.5–5.1)
Sodium: 142 mEq/L (ref 135–145)

## 2015-01-13 LAB — HEPATIC FUNCTION PANEL
ALT: 21 U/L (ref 0–53)
AST: 16 U/L (ref 0–37)
Albumin: 4.2 g/dL (ref 3.5–5.2)
Alkaline Phosphatase: 55 U/L (ref 39–117)
Bilirubin, Direct: 0.1 mg/dL (ref 0.0–0.3)
Total Bilirubin: 0.4 mg/dL (ref 0.2–1.2)
Total Protein: 6.5 g/dL (ref 6.0–8.3)

## 2015-01-13 MED ORDER — CLOPIDOGREL BISULFATE 75 MG PO TABS: ORAL_TABLET | ORAL | Status: DC

## 2015-01-13 MED ORDER — LISINOPRIL 20 MG PO TABS: 20.0000 mg | ORAL_TABLET | Freq: Two times a day (BID) | ORAL | Status: DC

## 2015-01-13 NOTE — Patient Instructions (Addendum)
Medication Instructions:  STOP Brilinta START Plavix INCREASE Lisinopril to 20 mg twice daily   Labwork: TODAY - cholesterol, liver, basic metabolic panel  Your physician recommends that you return for lab work in: 1 month for basic metabolic panel   Testing/Procedures: None Ordered   Follow-Up: Your physician wants you to follow-up in: 6 months with Dr. Acie Fredrickson.  You will receive a reminder letter in the mail two months in advance. If you don't receive a letter, please call our office to schedule the follow-up appointment.
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
## 2015-02-09 ENCOUNTER — Encounter: Payer: Self-pay | Admitting: Nurse Practitioner

## 2015-02-09 ENCOUNTER — Ambulatory Visit (INDEPENDENT_AMBULATORY_CARE_PROVIDER_SITE_OTHER): Payer: Medicare Other | Admitting: Nurse Practitioner

## 2015-02-09 VITALS — BP 122/82 | HR 69 | Temp 97.9°F | Resp 20 | Ht 71.0 in | Wt 191.2 lb

## 2015-02-09 DIAGNOSIS — R5383 Other fatigue: Secondary | ICD-10-CM

## 2015-02-09 DIAGNOSIS — I2111 ST elevation (STEMI) myocardial infarction involving right coronary artery: Secondary | ICD-10-CM | POA: Diagnosis not present

## 2015-02-09 DIAGNOSIS — E785 Hyperlipidemia, unspecified: Secondary | ICD-10-CM | POA: Diagnosis not present

## 2015-02-09 DIAGNOSIS — R350 Frequency of micturition: Secondary | ICD-10-CM
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
## 2015-02-10 ENCOUNTER — Telehealth: Payer: Self-pay

## 2015-02-10 DIAGNOSIS — I10 Essential (primary) hypertension: Secondary | ICD-10-CM

## 2015-02-10 LAB — PSA: Prostate Specific Ag, Serum: 1.5 ng/mL (ref 0.0–4.0)

## 2015-02-10 MED ORDER — LISINOPRIL 20 MG PO TABS: ORAL_TABLET | ORAL | Status: DC

## 2015-02-10 NOTE — Telephone Encounter (Signed)
Patient mail order for Lisinopril will take awhile, needs Rx to take to local pharmacy. Lisinopril 20mg  one twice daily #20  written, Dr. Eulas Post sign, patient to pick up, doesn't know which pharmacy he wants to go to .

## 2015-02-22 ENCOUNTER — Other Ambulatory Visit: Payer: Self-pay | Admitting: Nurse Practitioner

## 2015-02-23 ENCOUNTER — Other Ambulatory Visit: Payer: Medicare Other

## 2015-03-06 ENCOUNTER — Other Ambulatory Visit (INDEPENDENT_AMBULATORY_CARE_PROVIDER_SITE_OTHER): Payer: Medicare Other | Admitting: *Deleted

## 2015-03-06 DIAGNOSIS — E7800 Pure hypercholesterolemia, unspecified: Secondary | ICD-10-CM

## 2015-03-06 DIAGNOSIS — I10 Essential (primary) hypertension: Secondary | ICD-10-CM | POA: Diagnosis not present

## 2015-03-06 LAB — BASIC METABOLIC PANEL
BUN: 18 mg/dL (ref 7–25)
CO2: 27 mmol/L (ref 20–31)
Calcium: 9.1 mg/dL (ref 8.6–10.3)
Chloride: 104 mmol/L (ref 98–110)
Creat: 0.87 mg/dL (ref 0.70–1.25)
Glucose, Bld: 107 mg/dL — ABNORMAL HIGH (ref 65–99)
Potassium: 4.2 mmol/L (ref 3.5–5.3)
Sodium: 140 mmol/L (ref 135–146)

## 2015-03-06 NOTE — Addendum Note (Signed)
Addended by: Eulis Foster on: 03/06/2015 03:15 PM   Modules accepted: Orders

## 2015-03-22 ENCOUNTER — Other Ambulatory Visit: Payer: Self-pay | Admitting: Nurse Practitioner

## 2015-04-01 ENCOUNTER — Other Ambulatory Visit: Payer: Self-pay | Admitting: Internal Medicine

## 2015-06-15 ENCOUNTER — Encounter: Payer: Self-pay | Admitting: Nurse Practitioner

## 2015-06-15 ENCOUNTER — Ambulatory Visit (INDEPENDENT_AMBULATORY_CARE_PROVIDER_SITE_OTHER): Payer: Medicare Other | Admitting: Nurse Practitioner

## 2015-06-15 VITALS — BP 122/78 | HR 65 | Temp 97.9°F | Resp 20 | Ht 71.0 in | Wt 203.2 lb

## 2015-06-15 DIAGNOSIS — M5136 Other intervertebral disc degeneration, lumbar region: Secondary | ICD-10-CM

## 2015-06-15 DIAGNOSIS — I25810 Atherosclerosis of coronary artery bypass graft(s) without angina pectoris: Secondary | ICD-10-CM

## 2015-06-15 DIAGNOSIS — E785 Hyperlipidemia, unspecified: Secondary | ICD-10-CM | POA: Diagnosis not present

## 2015-06-15 DIAGNOSIS — I10 Essential (primary) hypertension: Secondary | ICD-10-CM | POA: Diagnosis not present

## 2015-06-15 DIAGNOSIS — R350 Frequency of micturition: Secondary | ICD-10-CM

## 2015-06-15 NOTE — Patient Instructions (Signed)
To schedule fasting lab work  Follow up in 6 months with DR Mariea Clonts   Will refer you to urology for evaluation
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
## 2015-06-16 ENCOUNTER — Other Ambulatory Visit: Payer: Medicare Other

## 2015-06-16 DIAGNOSIS — E785 Hyperlipidemia, unspecified: Secondary | ICD-10-CM | POA: Diagnosis not present

## 2015-06-16 DIAGNOSIS — I25810 Atherosclerosis of coronary artery bypass graft(s) without angina pectoris: Secondary | ICD-10-CM

## 2015-06-17 LAB — LIPID PANEL
Chol/HDL Ratio: 2.8 ratio units (ref 0.0–5.0)
Cholesterol, Total: 150 mg/dL (ref 100–199)
HDL: 54 mg/dL (ref >39)
LDL Calculated: 83 mg/dL (ref 0–99)
Triglycerides: 63 mg/dL (ref 0–149)
VLDL Cholesterol Cal: 13 mg/dL (ref 5–40)

## 2015-06-17 LAB — CBC WITH DIFFERENTIAL/PLATELET
Basophils Absolute: 0 10*3/uL (ref 0.0–0.2)
Basos: 0 %
EOS (ABSOLUTE): 0.5 10*3/uL — ABNORMAL HIGH (ref 0.0–0.4)
Eos: 6 %
Hematocrit: 46.1 % (ref 37.5–51.0)
Hemoglobin: 15.4 g/dL (ref 12.6–17.7)
Immature Grans (Abs): 0 10*3/uL (ref 0.0–0.1)
Immature Granulocytes: 0 %
Lymphocytes Absolute: 1.7 10*3/uL (ref 0.7–3.1)
Lymphs: 21 %
MCH: 30.6 pg (ref 26.6–33.0)
MCHC: 33.4 g/dL (ref 31.5–35.7)
MCV: 92 fL (ref 79–97)
Monocytes Absolute: 1 10*3/uL — ABNORMAL HIGH (ref 0.1–0.9)
Monocytes: 12 %
Neutrophils Absolute: 4.9 10*3/uL (ref 1.4–7.0)
Neutrophils: 61 %
Platelets: 284 10*3/uL (ref 150–379)
RBC: 5.03 x10E6/uL (ref 4.14–5.80)
RDW: 13.6 % (ref 12.3–15.4)
WBC: 8 10*3/uL (ref 3.4–10.8)

## 2015-06-17 LAB — COMPREHENSIVE METABOLIC PANEL
ALT: 25 IU/L (ref 0–44)
AST: 19 IU/L (ref 0–40)
Albumin/Globulin Ratio: 2.3 (ref 1.1–2.5)
Albumin: 4.2 g/dL (ref 3.6–4.8)
Alkaline Phosphatase: 57 IU/L (ref 39–117)
BUN/Creatinine Ratio: 27 — ABNORMAL HIGH (ref 10–22)
BUN: 22 mg/dL (ref 8–27)
Bilirubin Total: 0.5 mg/dL (ref 0.0–1.2)
CO2: 25 mmol/L (ref 18–29)
Calcium: 9.1 mg/dL (ref 8.6–10.2)
Chloride: 102 mmol/L (ref 96–106)
Creatinine, Ser: 0.81 mg/dL (ref 0.76–1.27)
GFR calc Af Amer: 106 mL/min/{1.73_m2} (ref >59)
GFR calc non Af Amer: 92 mL/min/{1.73_m2} (ref >59)
Globulin, Total: 1.8 g/dL (ref 1.5–4.5)
Glucose: 94 mg/dL (ref 65–99)
Potassium: 5.1 mmol/L (ref 3.5–5.2)
Sodium: 144 mmol/L (ref 134–144)
Total Protein: 6 g/dL (ref 6.0–8.5)

## 2015-07-03 ENCOUNTER — Other Ambulatory Visit: Payer: Self-pay | Admitting: Internal Medicine

## 2015-08-05 ENCOUNTER — Emergency Department (INDEPENDENT_AMBULATORY_CARE_PROVIDER_SITE_OTHER)
Admission: EM | Admit: 2015-08-05 | Discharge: 2015-08-05 | Disposition: A | Discharge status: Home / Self Care | Payer: Medicare Other | Source: Home / Self Care | Attending: Family Medicine | Admitting: Family Medicine

## 2015-08-05 ENCOUNTER — Emergency Department (INDEPENDENT_AMBULATORY_CARE_PROVIDER_SITE_OTHER): Payer: Medicare Other

## 2015-08-05 ENCOUNTER — Encounter (HOSPITAL_COMMUNITY): Payer: Self-pay | Admitting: Emergency Medicine

## 2015-08-05 DIAGNOSIS — R091 Pleurisy: Secondary | ICD-10-CM

## 2015-08-05 DIAGNOSIS — R509 Fever, unspecified: Secondary | ICD-10-CM | POA: Diagnosis not present

## 2015-08-05 DIAGNOSIS — R05 Cough: Secondary | ICD-10-CM | POA: Diagnosis not present

## 2015-08-05 MED ORDER — AZITHROMYCIN 250 MG PO TABS: ORAL_TABLET | ORAL | Status: DC

## 2015-08-05 NOTE — ED Notes (Signed)
The patient presented to the [facility] with a complaint of a cough, chest wall pain, intermittent joint pain and some nausea for a little over 1 week. The patient stated that it has gotten worse over the last few days. He reported that he has tried OTC antihistamines and decongestants with no relief.

## 2015-08-05 NOTE — Discharge Instructions (Signed)
Pleurisy °Pleurisy is redness, puffiness (swelling), and soreness (inflammation) of the lining of the lungs. It can be hard to breathe and hurt to breathe. Coughing or deep breathing will make it hurt more. It is often caused by an existing infection or disease.  °HOME CARE °· Only take medicine as told by your doctor. °· Only take antibiotic medicine as directed. Make sure to finish it even if you start to feel better. °GET HELP RIGHT AWAY IF:  °· Your lips, fingernails, or toenails are blue or dark. °· You cough up blood. °· You have a hard time breathing. °· Your pain is not controlled with medicine or it lasts for more than 1 week. °· Your pain spreads (radiates) into your neck, arms, or jaw. °· You are short of breath or wheezing. °· You develop a fever, rash, throw up (vomit), or faint. °MAKE SURE YOU:  °· Understand these instructions. °· Will watch your condition. °· Will get help right away if you are not doing well or get worse. °  °This information is not intended to replace advice given to you by your health care provider. Make sure you discuss any questions you have with your health care provider. °  °Document Released: 04/11/2008 Document Revised: 12/30/2012 Document Reviewed: 10/11/2012 °Elsevier Interactive Patient Education ©2016 [employer] ° °
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
## 2015-08-10 ENCOUNTER — Other Ambulatory Visit: Payer: Self-pay | Admitting: Nurse Practitioner

## 2015-08-10 ENCOUNTER — Ambulatory Visit (INDEPENDENT_AMBULATORY_CARE_PROVIDER_SITE_OTHER): Payer: Medicare Other | Admitting: Nurse Practitioner

## 2015-08-10 ENCOUNTER — Ambulatory Visit: Payer: Self-pay

## 2015-08-10 ENCOUNTER — Encounter: Payer: Self-pay | Admitting: Nurse Practitioner

## 2015-08-10 ENCOUNTER — Ambulatory Visit
Admission: RE | Admit: 2015-08-10 | Discharge: 2015-08-10 | Disposition: A | Discharge status: Home / Self Care | Payer: Medicare Other | Source: Ambulatory Visit | Attending: Nurse Practitioner | Admitting: Nurse Practitioner

## 2015-08-10 VITALS — BP 132/78 | HR 55 | Temp 97.9°F | Ht 71.0 in | Wt 201.8 lb

## 2015-08-10 DIAGNOSIS — R091 Pleurisy: Secondary | ICD-10-CM

## 2015-08-10 DIAGNOSIS — I25810 Atherosclerosis of coronary artery bypass graft(s) without angina pectoris: Secondary | ICD-10-CM

## 2015-08-10 DIAGNOSIS — I10 Essential (primary) hypertension: Secondary | ICD-10-CM

## 2015-08-10 DIAGNOSIS — F418 Other specified anxiety disorders: Secondary | ICD-10-CM | POA: Diagnosis not present

## 2015-08-10 DIAGNOSIS — G4700 Insomnia, unspecified: Secondary | ICD-10-CM | POA: Insufficient documentation

## 2015-08-10 DIAGNOSIS — J9811 Atelectasis: Secondary | ICD-10-CM | POA: Diagnosis not present

## 2015-08-10 DIAGNOSIS — R350 Frequency of micturition: Secondary | ICD-10-CM | POA: Insufficient documentation

## 2015-08-10 DIAGNOSIS — G47419 Narcolepsy without cataplexy: Secondary | ICD-10-CM | POA: Diagnosis not present

## 2015-08-10 NOTE — Assessment & Plan Note (Signed)
Sleeps with aid of Temazepam 15mg  daily.

## 2015-08-10 NOTE — Assessment & Plan Note (Signed)
Continue Tamsulosin 0.4mg  daily, no urinary retention.

## 2015-08-10 NOTE — Assessment & Plan Note (Signed)
Blood pressure is controlled, continue Lisinopril 20mg  bid, Metoprolol 25mg  bid

## 2015-08-10 NOTE — Assessment & Plan Note (Signed)
Mood is managed with Alprazolam 1mg  bid prn

## 2015-08-10 NOTE — Assessment & Plan Note (Signed)
Started cough and lower chest pain R+L, worsened with cough and deep breath, went to urgent care last Fri, complete Azithromycin, his symptoms are improved, but still has residual pain in his R+L lower chest pain. Repeat CXR, adding Omeprazole for 2 weeks to reduce ? GI irritation.

## 2015-08-10 NOTE — Assessment & Plan Note (Signed)
Managed with Adderall 30mg  daily prn, Ritalin 20mg  bid prn.
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
## 2015-08-11 ENCOUNTER — Telehealth: Payer: Self-pay | Admitting: *Deleted

## 2015-08-11 MED ORDER — AMOXICILLIN-POT CLAVULANATE 875-125 MG PO TABS: ORAL_TABLET | ORAL | Status: DC

## 2015-08-11 NOTE — Telephone Encounter (Signed)
Patient called requesting results. I called [facility]--(Impression: Prior CABG. Cardiomegaly. Mild right base subsegmental atelectasis and or infiltrate.) Right base new, treatment Augmentin 875/125mg  one tablet by mouth every 12 hours for 7 days. Not contagious may return to work.  Notified patient and he agreed. Faxed Rx to pharmacy.

## 2015-08-23 DIAGNOSIS — N401 Enlarged prostate with lower urinary tract symptoms: Secondary | ICD-10-CM | POA: Diagnosis not present

## 2015-08-23 DIAGNOSIS — N138 Other obstructive and reflux uropathy: Secondary | ICD-10-CM | POA: Diagnosis not present

## 2015-08-23 DIAGNOSIS — Z0000 Encounter for general adult medical examination without abnormal findings: Secondary | ICD-10-CM | POA: Diagnosis not present

## 2015-10-01 ENCOUNTER — Other Ambulatory Visit: Payer: Self-pay | Admitting: Nurse Practitioner

## 2015-10-11 ENCOUNTER — Other Ambulatory Visit: Payer: Self-pay | Admitting: Internal Medicine

## 2015-11-03 DIAGNOSIS — R350 Frequency of micturition: Secondary | ICD-10-CM | POA: Diagnosis not present

## 2015-11-03 DIAGNOSIS — N401 Enlarged prostate with lower urinary tract symptoms: Secondary | ICD-10-CM | POA: Diagnosis not present

## 2015-12-15 ENCOUNTER — Encounter: Payer: Self-pay | Admitting: Internal Medicine

## 2015-12-15 ENCOUNTER — Other Ambulatory Visit: Payer: Self-pay

## 2015-12-15 ENCOUNTER — Ambulatory Visit (INDEPENDENT_AMBULATORY_CARE_PROVIDER_SITE_OTHER): Payer: Medicare Other | Admitting: Internal Medicine

## 2015-12-15 ENCOUNTER — Other Ambulatory Visit: Payer: Self-pay | Admitting: Internal Medicine

## 2015-12-15 VITALS — BP 132/80 | HR 55 | Temp 98.3°F | Ht 71.0 in | Wt 211.0 lb

## 2015-12-15 DIAGNOSIS — M51369 Other intervertebral disc degeneration, lumbar region without mention of lumbar back pain or lower extremity pain: Secondary | ICD-10-CM

## 2015-12-15 DIAGNOSIS — I2510 Atherosclerotic heart disease of native coronary artery without angina pectoris: Secondary | ICD-10-CM

## 2015-12-15 DIAGNOSIS — F418 Other specified anxiety disorders: Secondary | ICD-10-CM

## 2015-12-15 DIAGNOSIS — G4700 Insomnia, unspecified: Secondary | ICD-10-CM

## 2015-12-15 DIAGNOSIS — M5136 Other intervertebral disc degeneration, lumbar region: Secondary | ICD-10-CM | POA: Diagnosis not present

## 2015-12-15 DIAGNOSIS — G47419 Narcolepsy without cataplexy: Secondary | ICD-10-CM | POA: Diagnosis not present

## 2015-12-15 DIAGNOSIS — N401 Enlarged prostate with lower urinary tract symptoms: Secondary | ICD-10-CM

## 2015-12-15 DIAGNOSIS — N138 Other obstructive and reflux uropathy: Secondary | ICD-10-CM

## 2015-12-15 DIAGNOSIS — I10 Essential (primary) hypertension: Secondary | ICD-10-CM

## 2015-12-15 DIAGNOSIS — Z1211 Encounter for screening for malignant neoplasm of colon: Secondary | ICD-10-CM | POA: Diagnosis not present

## 2015-12-15 DIAGNOSIS — I25810 Atherosclerosis of coronary artery bypass graft(s) without angina pectoris: Secondary | ICD-10-CM

## 2015-12-15 LAB — LIPID PANEL
Cholesterol: 171 mg/dL (ref 125–200)
HDL: 58 mg/dL (ref >=40)
LDL Cholesterol: 101 mg/dL (ref <130)
Total CHOL/HDL Ratio: 2.9 Ratio (ref <=5.0)
Triglycerides: 61 mg/dL (ref <150)
VLDL: 12 mg/dL (ref <30)

## 2015-12-15 MED ORDER — LISINOPRIL 20 MG PO TABS: ORAL_TABLET | ORAL | 2 refills | Status: DC
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
## 2015-12-16 ENCOUNTER — Other Ambulatory Visit: Payer: Self-pay | Admitting: Internal Medicine

## 2015-12-16 MED ORDER — ROSUVASTATIN CALCIUM 10 MG PO TABS: 10.0000 mg | ORAL_TABLET | Freq: Every day | ORAL | 5 refills | Status: DC

## 2015-12-21 LAB — BASIC METABOLIC PANEL
BUN: 27 mg/dL — ABNORMAL HIGH (ref 7–25)
CO2: 25 mmol/L (ref 20–31)
Calcium: 9.4 mg/dL (ref 8.6–10.3)
Chloride: 107 mmol/L (ref 98–110)
Creat: 0.94 mg/dL (ref 0.70–1.25)
Glucose, Bld: 110 mg/dL — ABNORMAL HIGH (ref 65–99)
Potassium: 5 mmol/L (ref 3.5–5.3)
Sodium: 143 mmol/L (ref 135–146)

## 2016-01-13 ENCOUNTER — Other Ambulatory Visit: Payer: Self-pay | Admitting: Nurse Practitioner

## 2016-01-16 NOTE — Telephone Encounter (Signed)
rx sent to pharmacy by e-script  

## 2016-02-19 ENCOUNTER — Ambulatory Visit (INDEPENDENT_AMBULATORY_CARE_PROVIDER_SITE_OTHER): Payer: Medicare Other | Admitting: Nurse Practitioner

## 2016-02-19 ENCOUNTER — Encounter: Payer: Self-pay | Admitting: Nurse Practitioner

## 2016-02-19 ENCOUNTER — Encounter (INDEPENDENT_AMBULATORY_CARE_PROVIDER_SITE_OTHER): Payer: Self-pay

## 2016-02-19 VITALS — BP 130/72 | HR 60 | Ht 70.0 in | Wt 219.8 lb

## 2016-02-19 DIAGNOSIS — I10 Essential (primary) hypertension: Secondary | ICD-10-CM | POA: Diagnosis not present

## 2016-02-19 DIAGNOSIS — I25810 Atherosclerosis of coronary artery bypass graft(s) without angina pectoris: Secondary | ICD-10-CM

## 2016-02-19 DIAGNOSIS — E7800 Pure hypercholesterolemia, unspecified: Secondary | ICD-10-CM

## 2016-02-19 DIAGNOSIS — I2510 Atherosclerotic heart disease of native coronary artery without angina pectoris: Secondary | ICD-10-CM | POA: Diagnosis not present

## 2016-02-19 NOTE — Patient Instructions (Addendum)
We will be checking the following labs today - NONE   Medication Instructions:    Continue with your current medicines.     Testing/Procedures To Be Arranged:  N/A  Follow-Up:   See Dr. Acie Fredrickson in 6 months.     Other Special Instructions:   Will try to refer you to cardiac rehab - if they say "No" - I would like for you to have a low level GXT so we can help you with an exercise program.     If you need a refill on your cardiac medications before your next appointment, please call your pharmacy.   Call the [place] office at [phone] if you have any questions, problems or concerns.
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
## 2016-03-05 ENCOUNTER — Other Ambulatory Visit: Payer: Self-pay | Admitting: Internal Medicine

## 2016-03-08 ENCOUNTER — Encounter: Payer: Self-pay | Admitting: Nurse Practitioner

## 2016-03-12 ENCOUNTER — Telehealth: Payer: Self-pay | Admitting: Nurse Practitioner

## 2016-03-12 NOTE — Telephone Encounter (Signed)
left msg asking pt to confirm if this time will work for [place] with nurse. VDM (DD)

## 2016-03-18 ENCOUNTER — Telehealth: Payer: Self-pay | Admitting: Cardiovascular Disease

## 2016-03-18 NOTE — Telephone Encounter (Signed)
New message  [place] [facility] call requesting to speak with RN about referral that was faxed over. She states there was no date and time and that information is needed. Please refax referral and call back if needed.

## 2016-03-18 NOTE — Telephone Encounter (Signed)
Left another msg on 11/6 about this appt and asked to confirm.  Explained we will cancel it if not confirmed by the end of the day.

## 2016-03-18 NOTE — Telephone Encounter (Signed)
Left message for Ivan [name] to call back

## 2016-03-19 ENCOUNTER — Ambulatory Visit (INDEPENDENT_AMBULATORY_CARE_PROVIDER_SITE_OTHER): Payer: Medicare Other

## 2016-03-19 ENCOUNTER — Ambulatory Visit: Payer: Medicare Other

## 2016-03-19 VITALS — BP 140/80 | HR 60 | Temp 98.6°F | Ht 70.0 in | Wt 219.2 lb

## 2016-03-19 DIAGNOSIS — Z23 Encounter for immunization: Secondary | ICD-10-CM

## 2016-03-19 DIAGNOSIS — Z0000 Encounter for general adult medical examination without abnormal findings: Secondary | ICD-10-CM
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
## 2016-03-19 NOTE — Telephone Encounter (Signed)
Follow up      Returning a call to the nurse.  Please refax referral with date and time to [phone].

## 2016-03-19 NOTE — Patient Instructions (Signed)
Mr. Ess , Thank you for taking time to come for your Medicare Wellness Visit. I appreciate your ongoing commitment to your health goals. Please review the following plan we discussed and let me know if I can assist you in the future.   These are the goals we discussed: Goals    None      This is a list of the screening recommended for you and due dates:  Health Maintenance  Topic Date Due  .  Hepatitis C: One time screening is recommended by Center for Disease Control  (CDC) for  adults born from [birth year] through 1963.   1945-07-27  . Colon Cancer Screening  04/30/1998  . Shingles Vaccine  04/30/2008  . Flu Shot  05/13/2016*  . Tetanus Vaccine  05/13/2017  . Pneumonia vaccines  Completed  *Topic was postponed. The date shown is not the original due date.  Preventive Care for Adults  A healthy lifestyle and preventive care can promote health and wellness. Preventive health guidelines for adults include the following key practices.  . A routine yearly physical is a good way to check with your health care provider about your health and preventive screening. It is a chance to share any concerns and updates on your health and to receive a thorough exam.  . Visit your dentist for a routine exam and preventive care every 6 months. Brush your teeth twice a day and floss once a day. Good oral hygiene prevents tooth decay and gum disease.  . The frequency of eye exams is based on your age, health, family medical history, use  of contact lenses, and other factors. Follow your health care provider's ecommendations for frequency of eye exams.  . Eat a healthy diet. Foods like vegetables, fruits, whole grains, low-fat dairy products, and lean protein foods contain the nutrients you need without too many calories. Decrease your intake of foods high in solid fats, added sugars, and salt. Eat the right amount of calories for you. Get information about a proper diet from your health care provider, if  necessary.  . Regular physical exercise is one of the most important things you can do for your health. Most adults should get at least 150 minutes of moderate-intensity exercise (any activity that increases your heart rate and causes you to sweat) each week. In addition, most adults need muscle-strengthening exercises on 2 or more days a week.  Silver Sneakers may be a benefit available to you. To determine eligibility, you may visit the website: www.silversneakers.com or contact program at 8728885632 Mon-Fri between 8AM-8PM.   . Maintain a healthy weight. The body mass index (BMI) is a screening tool to identify possible weight problems. It provides an estimate of body fat based on height and weight. Your health care provider can find your BMI and can help you achieve or maintain a healthy weight.   For adults 20 years and older: ? A BMI below 18.5 is considered underweight. ? A BMI of 18.5 to 24.9 is normal. ? A BMI of 25 to 29.9 is considered overweight. ? A BMI of 30 and above is considered obese.   . Maintain normal blood lipids and cholesterol levels by exercising and minimizing your intake of saturated fat. Eat a balanced diet with plenty of fruit and vegetables. Blood tests for lipids and cholesterol should begin at age 107 and be repeated every 5 years. If your lipid or cholesterol levels are high, you are over 50, or you are at high risk for  heart disease, you may need your cholesterol levels checked more frequently. Ongoing high lipid and cholesterol levels should be treated with medicines if diet and exercise are not working.  . If you smoke, find out from your health care provider how to quit. If you do not use tobacco, please do not start.  . If you choose to drink alcohol, please do not consume more than 2 drinks per day. One drink is considered to be 12 ounces (355 mL) of beer, 5 ounces (148 mL) of wine, or 1.5 ounces (44 mL) of liquor.  . If you are 56-75 years old, ask your  health care provider if you should take aspirin to prevent strokes.  . Use sunscreen. Apply sunscreen liberally and repeatedly throughout the day. You should seek shade when your shadow is shorter than you. Protect yourself by wearing long sleeves, pants, a wide-brimmed hat, and sunglasses year round, whenever you are outdoors.  . Once a month, do a whole body skin exam, using a mirror to look at the skin on your back. Tell your health care provider of new moles, moles that have irregular borders, moles that are larger than a pencil eraser, or moles that have changed in shape or color.

## 2016-03-19 NOTE — Telephone Encounter (Signed)
Dr. Acie Fredrickson signed, dated and timed the order while at the hosptial

## 2016-05-13 HISTORY — PX: CATARACT EXTRACTION W/ INTRAOCULAR LENS  IMPLANT, BILATERAL: SHX1307

## 2016-06-10 ENCOUNTER — Other Ambulatory Visit: Payer: Self-pay | Admitting: Nurse Practitioner

## 2016-06-10 DIAGNOSIS — E7800 Pure hypercholesterolemia, unspecified: Secondary | ICD-10-CM

## 2016-06-17 ENCOUNTER — Other Ambulatory Visit: Payer: Medicare Other

## 2016-06-18 ENCOUNTER — Other Ambulatory Visit: Payer: Medicare Other

## 2016-06-20 ENCOUNTER — Encounter: Payer: Medicare Other | Admitting: Nurse Practitioner

## 2016-06-21 ENCOUNTER — Encounter: Payer: Medicare Other | Admitting: Internal Medicine

## 2016-06-21 ENCOUNTER — Other Ambulatory Visit: Payer: Self-pay | Admitting: Nurse Practitioner

## 2016-06-21 ENCOUNTER — Other Ambulatory Visit: Payer: Medicare Other

## 2016-06-21 ENCOUNTER — Ambulatory Visit: Payer: Medicare Other | Admitting: Internal Medicine

## 2016-06-21 DIAGNOSIS — R739 Hyperglycemia, unspecified: Secondary | ICD-10-CM | POA: Diagnosis not present

## 2016-06-21 DIAGNOSIS — E7800 Pure hypercholesterolemia, unspecified: Secondary | ICD-10-CM

## 2016-06-21 LAB — CBC WITH DIFFERENTIAL/PLATELET
Basophils Absolute: 0 cells/uL (ref 0–200)
Basophils Relative: 0 %
Eosinophils Absolute: 414 cells/uL (ref 15–500)
Eosinophils Relative: 6 %
HCT: 45.4 % (ref 38.5–50.0)
Hemoglobin: 15.2 g/dL (ref 13.2–17.1)
Lymphocytes Relative: 26 %
Lymphs Abs: 1794 cells/uL (ref 850–3900)
MCH: 31.6 pg (ref 27.0–33.0)
MCHC: 33.5 g/dL (ref 32.0–36.0)
MCV: 94.4 fL (ref 80.0–100.0)
MPV: 9.8 fL (ref 7.5–12.5)
Monocytes Absolute: 828 cells/uL (ref 200–950)
Monocytes Relative: 12 %
Neutro Abs: 3864 cells/uL (ref 1500–7800)
Neutrophils Relative %: 56 %
Platelets: 225 10*3/uL (ref 140–400)
RBC: 4.81 MIL/uL (ref 4.20–5.80)
RDW: 13.7 % (ref 11.0–15.0)
WBC: 6.9 10*3/uL (ref 3.8–10.8)

## 2016-06-21 LAB — COMPLETE METABOLIC PANEL WITH GFR
ALT: 31 U/L (ref 9–46)
AST: 23 U/L (ref 10–35)
Albumin: 4.2 g/dL (ref 3.6–5.1)
Alkaline Phosphatase: 47 U/L (ref 40–115)
BUN: 19 mg/dL (ref 7–25)
CO2: 27 mmol/L (ref 20–31)
Calcium: 9.4 mg/dL (ref 8.6–10.3)
Chloride: 107 mmol/L (ref 98–110)
Creat: 0.83 mg/dL (ref 0.70–1.25)
GFR, Est African American: >89 mL/min (ref >=60)
GFR, Est Non African American: >89 mL/min (ref >=60)
Glucose, Bld: 104 mg/dL — ABNORMAL HIGH (ref 65–99)
Potassium: 5.1 mmol/L (ref 3.5–5.3)
Sodium: 141 mmol/L (ref 135–146)
Total Bilirubin: 0.5 mg/dL (ref 0.2–1.2)
Total Protein: 6.4 g/dL (ref 6.1–8.1)

## 2016-06-21 LAB — LIPID PANEL
Cholesterol: 151 mg/dL (ref <200)
HDL: 50 mg/dL (ref >40)
LDL Cholesterol: 85 mg/dL (ref <100)
Total CHOL/HDL Ratio: 3 Ratio (ref <5.0)
Triglycerides: 80 mg/dL (ref <150)
VLDL: 16 mg/dL (ref <30)

## 2016-06-24 ENCOUNTER — Ambulatory Visit: Payer: Medicare Other | Admitting: Nurse Practitioner

## 2016-06-25 ENCOUNTER — Encounter: Payer: Self-pay | Admitting: Nurse Practitioner

## 2016-06-25 ENCOUNTER — Ambulatory Visit (INDEPENDENT_AMBULATORY_CARE_PROVIDER_SITE_OTHER): Payer: Medicare Other | Admitting: Nurse Practitioner

## 2016-06-25 VITALS — BP 132/84 | HR 73 | Temp 98.0°F | Resp 18 | Ht 70.0 in | Wt 218.4 lb

## 2016-06-25 DIAGNOSIS — M5136 Other intervertebral disc degeneration, lumbar region: Secondary | ICD-10-CM | POA: Diagnosis not present

## 2016-06-25 DIAGNOSIS — E785 Hyperlipidemia, unspecified: Secondary | ICD-10-CM

## 2016-06-25 DIAGNOSIS — H269 Unspecified cataract: Secondary | ICD-10-CM | POA: Diagnosis not present

## 2016-06-25 DIAGNOSIS — R739 Hyperglycemia, unspecified: Secondary | ICD-10-CM

## 2016-06-25 DIAGNOSIS — Z1211 Encounter for screening for malignant neoplasm of colon: Secondary | ICD-10-CM | POA: Diagnosis not present

## 2016-06-25 DIAGNOSIS — Z720 Tobacco use: Secondary | ICD-10-CM

## 2016-06-25 DIAGNOSIS — Z0000 Encounter for general adult medical examination without abnormal findings: Secondary | ICD-10-CM | POA: Diagnosis not present

## 2016-06-25 MED ORDER — TRAMADOL HCL 50 MG PO TABS: ORAL_TABLET | ORAL | 0 refills | Status: DC
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
## 2016-06-25 NOTE — Patient Instructions (Addendum)
We will set up appt for CT lung-screening due to smoking history    DASH Eating Plan DASH stands for "Dietary Approaches to Stop Hypertension." The DASH eating plan is a healthy eating plan that has been shown to reduce high blood pressure (hypertension). Additional health benefits may include reducing the risk of type 2 diabetes mellitus, heart disease, and stroke. The DASH eating plan may also help with weight loss. What do I need to know about the DASH eating plan? For the DASH eating plan, you will follow these general guidelines:  Choose foods with less than 150 milligrams of sodium per serving (as listed on the food label).  Use salt-free seasonings or herbs instead of table salt or sea salt.  Check with your health care provider or pharmacist before using salt substitutes.  Eat lower-sodium products. These are often labeled as "low-sodium" or "no salt added."  Eat fresh foods. Avoid eating a lot of canned foods.  Eat more vegetables, fruits, and low-fat dairy products.  Choose whole grains. Look for the word "whole" as the first word in the ingredient list.  Choose fish and skinless chicken or [place] more often than red meat. Limit fish, poultry, and meat to 6 oz (170 g) each day.  Limit sweets, desserts, sugars, and sugary drinks.  Choose heart-healthy fats.  Eat more home-cooked food and less restaurant, buffet, and fast food.  Limit fried foods.  Do not fry foods. Cook foods using methods such as baking, boiling, grilling, and broiling instead.  When eating at a restaurant, ask that your food be prepared with less salt, or no salt if possible. What foods can I eat? Seek help from a dietitian for individual calorie needs. Grains  Whole grain or whole wheat bread. Brown rice. Whole grain or whole wheat pasta. Quinoa, bulgur, and whole grain cereals. Low-sodium cereals. Corn or whole wheat flour tortillas. Whole grain cornbread. Whole grain crackers. Low-sodium  crackers. Vegetables  Fresh or frozen vegetables (raw, steamed, roasted, or grilled). Low-sodium or reduced-sodium tomato and vegetable juices. Low-sodium or reduced-sodium tomato sauce and paste. Low-sodium or reduced-sodium canned vegetables. Fruits  All fresh, canned (in natural juice), or frozen fruits. Meat and Other Protein Products  Ground beef (85% or leaner), grass-fed beef, or beef trimmed of fat. Skinless chicken or [place]. Ground chicken or [place]. Pork trimmed of fat. All fish and seafood. Eggs. Dried beans, peas, or lentils. Unsalted nuts and seeds. Unsalted canned beans. Dairy  Low-fat dairy products, such as skim or 1% milk, 2% or reduced-fat cheeses, low-fat ricotta or cottage cheese, or plain low-fat yogurt. Low-sodium or reduced-sodium cheeses. Fats and Oils  Tub margarines without trans fats. Light or reduced-fat mayonnaise and salad dressings (reduced sodium). Avocado. Safflower, olive, or canola oils. Natural peanut or almond butter. Other  Unsalted popcorn and pretzels. The items listed above may not be a complete list of recommended foods or beverages. Contact your dietitian for more options.  What foods are not recommended? Grains  White bread. White pasta. White rice. Refined cornbread. Bagels and croissants. Crackers that contain trans fat. Vegetables  Creamed or fried vegetables. Vegetables in a cheese sauce. Regular canned vegetables. Regular canned tomato sauce and paste. Regular tomato and vegetable juices. Fruits  Canned fruit in light or heavy syrup. Fruit juice. Meat and Other Protein Products  Fatty cuts of meat. Ribs, chicken wings, bacon, sausage, bologna, salami, chitterlings, fatback, hot dogs, bratwurst, and packaged luncheon meats. Salted nuts and seeds. Canned beans with salt. Dairy  Whole or 2% milk, cream, half-and-half, and cream cheese. Whole-fat or sweetened yogurt. Full-fat cheeses or blue cheese. Nondairy creamers and whipped toppings.  Processed cheese, cheese spreads, or cheese curds. Condiments  Onion and garlic salt, seasoned salt, table salt, and sea salt. Canned and packaged gravies. Worcestershire sauce. Tartar sauce. Barbecue sauce. Teriyaki sauce. Soy sauce, including reduced sodium. Steak sauce. Fish sauce. Oyster sauce. Cocktail sauce. Horseradish. Ketchup and mustard. Meat flavorings and tenderizers. Bouillon cubes. Hot sauce. Tabasco sauce. Marinades. Taco seasonings. Relishes. Fats and Oils  Butter, stick margarine, lard, shortening, ghee, and bacon fat. Coconut, palm kernel, or palm oils. Regular salad dressings. Other  Pickles and olives. Salted popcorn and pretzels. The items listed above may not be a complete list of foods and beverages to avoid. Contact your dietitian for more information.  Where can I find more information? National Heart, Lung, and Blood Institute: travelstabloid.com This information is not intended to replace advice given to you by your health care provider. Make sure you discuss any questions you have with your health care provider. Document Released: 04/18/2011 Document Revised: 10/05/2015 Document Reviewed: 03/03/2013 Elsevier Interactive Patient Education  2017 [employer].

## 2016-06-26 LAB — HEMOGLOBIN A1C
Hgb A1c MFr Bld: 5.5 % (ref <5.7)
Mean Plasma Glucose: 111 mg/dL

## 2016-06-27 NOTE — Addendum Note (Signed)
Addended by: Lauree Chandler on: 06/27/2016 01:08 PM   Modules accepted: Level of Service

## 2016-07-04 DIAGNOSIS — M5136 Other intervertebral disc degeneration, lumbar region: Secondary | ICD-10-CM | POA: Diagnosis not present

## 2016-07-04 DIAGNOSIS — M47816 Spondylosis without myelopathy or radiculopathy, lumbar region: Secondary | ICD-10-CM | POA: Diagnosis not present

## 2016-07-04 DIAGNOSIS — M5416 Radiculopathy, lumbar region: Secondary | ICD-10-CM | POA: Diagnosis not present

## 2016-07-05 ENCOUNTER — Other Ambulatory Visit: Payer: Self-pay | Admitting: Rehabilitation

## 2016-07-05 DIAGNOSIS — M5136 Other intervertebral disc degeneration, lumbar region: Secondary | ICD-10-CM

## 2016-07-11 ENCOUNTER — Ambulatory Visit
Admission: RE | Admit: 2016-07-11 | Discharge: 2016-07-11 | Disposition: A | Discharge status: Home / Self Care | Payer: Medicare Other | Source: Ambulatory Visit | Attending: Rehabilitation | Admitting: Rehabilitation

## 2016-07-11 DIAGNOSIS — M5136 Other intervertebral disc degeneration, lumbar region: Secondary | ICD-10-CM

## 2016-07-11 DIAGNOSIS — M48061 Spinal stenosis, lumbar region without neurogenic claudication: Secondary | ICD-10-CM | POA: Diagnosis not present

## 2016-07-24 ENCOUNTER — Encounter: Payer: Self-pay | Admitting: Nurse Practitioner

## 2016-08-01 DIAGNOSIS — M4716 Other spondylosis with myelopathy, lumbar region: Secondary | ICD-10-CM | POA: Diagnosis not present

## 2016-08-01 DIAGNOSIS — M5136 Other intervertebral disc degeneration, lumbar region: Secondary | ICD-10-CM | POA: Diagnosis not present

## 2016-08-01 DIAGNOSIS — M5416 Radiculopathy, lumbar region: Secondary | ICD-10-CM | POA: Diagnosis not present

## 2016-08-13 DIAGNOSIS — M5136 Other intervertebral disc degeneration, lumbar region: Secondary | ICD-10-CM | POA: Diagnosis not present

## 2016-08-15 ENCOUNTER — Other Ambulatory Visit: Payer: Self-pay | Admitting: Internal Medicine

## 2016-08-15 DIAGNOSIS — I10 Essential (primary) hypertension: Secondary | ICD-10-CM

## 2016-08-27 DIAGNOSIS — M5416 Radiculopathy, lumbar region: Secondary | ICD-10-CM | POA: Diagnosis not present

## 2016-08-27 DIAGNOSIS — M545 Low back pain: Secondary | ICD-10-CM | POA: Diagnosis not present

## 2016-08-27 DIAGNOSIS — M5136 Other intervertebral disc degeneration, lumbar region: Secondary | ICD-10-CM | POA: Diagnosis not present

## 2016-09-11 ENCOUNTER — Encounter (HOSPITAL_COMMUNITY): Payer: Self-pay | Admitting: Emergency Medicine

## 2016-09-11 ENCOUNTER — Ambulatory Visit (HOSPITAL_COMMUNITY)
Admission: EM | Admit: 2016-09-11 | Discharge: 2016-09-11 | Disposition: A | Discharge status: Home / Self Care | Payer: Medicare Other | Attending: Emergency Medicine | Admitting: Emergency Medicine

## 2016-09-11 ENCOUNTER — Telehealth: Payer: Self-pay | Admitting: *Deleted

## 2016-09-11 DIAGNOSIS — M67912 Unspecified disorder of synovium and tendon, left shoulder: Secondary | ICD-10-CM | POA: Diagnosis not present

## 2016-09-11 DIAGNOSIS — M7582 Other shoulder lesions, left shoulder: Secondary | ICD-10-CM | POA: Diagnosis not present

## 2016-09-11 DIAGNOSIS — M25512 Pain in left shoulder: Secondary | ICD-10-CM | POA: Diagnosis not present

## 2016-09-11 MED ORDER — PREDNISONE 50 MG PO TABS: ORAL_TABLET | ORAL | 0 refills | Status: DC

## 2016-09-11 NOTE — ED Triage Notes (Signed)
The patient presented to the [facility] with a complaint of left shoulder pain that radiates into his left arm x 2 days. The patient denied any known injury.

## 2016-09-11 NOTE — Telephone Encounter (Signed)
Patient called and stated that he is having severe Left Shoulder Pain with Immobility. Stated there was no trauma, he woke up Tuesday morning and it was like this. 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 Patient does not have an Orthopaedic and No available appointment in office until Monday. Please Advise.

## 2016-09-11 NOTE — Telephone Encounter (Signed)
Patient will go to the Urgent Care to be evaluated.
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
## 2016-09-11 NOTE — Discharge Instructions (Signed)
For now wear the sling for support of the shoulder. Occasionally remove it to perform small range of motion movements. Apply ice packs to the areas of pain. Take your tramadol as directed as well as the prednisone to help with inflammation. Start calling the orthopedist today for an appointment early next week.

## 2016-09-11 NOTE — Telephone Encounter (Signed)
We can try to refer to ortho, not sure if this can be done without appt. unfortunately the only other recommendation would be to go to urgent care

## 2016-09-13 DIAGNOSIS — M7552 Bursitis of left shoulder: Secondary | ICD-10-CM | POA: Diagnosis not present

## 2016-09-30 DIAGNOSIS — M7552 Bursitis of left shoulder: Secondary | ICD-10-CM | POA: Diagnosis not present

## 2016-10-13 ENCOUNTER — Other Ambulatory Visit: Payer: Self-pay | Admitting: Internal Medicine

## 2016-12-10 ENCOUNTER — Other Ambulatory Visit: Payer: Self-pay | Admitting: Internal Medicine

## 2016-12-16 ENCOUNTER — Telehealth: Payer: Self-pay

## 2016-12-16 NOTE — Telephone Encounter (Signed)
Received fax from [facility]. The order has expired, it has been cancelled because it has been in an inactive order status it has exceeded 365 days from initial order. Called [facility] to ask if patient still has kit can he go ahead and mail it in, can he. They said no, since it had been over 365 days and order cancel. A new order would have to be submitted, and they would have to mail him another kit. He would have to throw the other kit away.  Left message on patient's voice mail to call, to ask if he wants to do it.

## 2016-12-21 ENCOUNTER — Other Ambulatory Visit: Payer: Self-pay | Admitting: Nurse Practitioner

## 2017-01-03 DIAGNOSIS — H25813 Combined forms of age-related cataract, bilateral: Secondary | ICD-10-CM | POA: Diagnosis not present

## 2017-01-03 DIAGNOSIS — H5010 Unspecified exotropia: Secondary | ICD-10-CM | POA: Diagnosis not present

## 2017-01-09 ENCOUNTER — Other Ambulatory Visit: Payer: Self-pay | Admitting: Nurse Practitioner

## 2017-01-09 NOTE — Telephone Encounter (Signed)
I called patient to see about scheduling an appointment but there was no answer.   I left a message for patient to call the office.

## 2017-01-16 ENCOUNTER — Other Ambulatory Visit: Payer: Self-pay | Admitting: Nurse Practitioner

## 2017-01-16 DIAGNOSIS — H25811 Combined forms of age-related cataract, right eye: Secondary | ICD-10-CM | POA: Diagnosis not present

## 2017-01-16 DIAGNOSIS — H268 Other specified cataract: Secondary | ICD-10-CM | POA: Diagnosis not present

## 2017-02-05 ENCOUNTER — Other Ambulatory Visit: Payer: Self-pay | Admitting: Nurse Practitioner

## 2017-02-05 ENCOUNTER — Telehealth: Payer: Self-pay | Admitting: *Deleted

## 2017-02-05 NOTE — Telephone Encounter (Signed)
Patient returned call and scheduled an appointment for 02/17/17 @ 2:15

## 2017-02-05 NOTE — Telephone Encounter (Signed)
Left message on voicemail for patient to return call when available   Reason for call: Patient is overdue for an appointment. Patient was to be seen in June for a 4 month follow-up, appointment not scheduled

## 2017-02-17 ENCOUNTER — Ambulatory Visit (INDEPENDENT_AMBULATORY_CARE_PROVIDER_SITE_OTHER): Payer: Medicare Other | Admitting: Nurse Practitioner

## 2017-02-17 ENCOUNTER — Encounter: Payer: Self-pay | Admitting: Nurse Practitioner

## 2017-02-17 VITALS — BP 160/92 | HR 75 | Temp 97.9°F | Resp 18 | Ht 70.0 in | Wt 222.0 lb

## 2017-02-17 DIAGNOSIS — R739 Hyperglycemia, unspecified: Secondary | ICD-10-CM | POA: Diagnosis not present

## 2017-02-17 DIAGNOSIS — I2510 Atherosclerotic heart disease of native coronary artery without angina pectoris: Secondary | ICD-10-CM | POA: Diagnosis not present

## 2017-02-17 DIAGNOSIS — E785 Hyperlipidemia, unspecified: Secondary | ICD-10-CM | POA: Diagnosis not present

## 2017-02-17 DIAGNOSIS — M5136 Other intervertebral disc degeneration, lumbar region: Secondary | ICD-10-CM | POA: Diagnosis not present

## 2017-02-17 DIAGNOSIS — Z1211 Encounter for screening for malignant neoplasm of colon: Secondary | ICD-10-CM | POA: Diagnosis not present

## 2017-02-17 DIAGNOSIS — H269 Unspecified cataract: Secondary | ICD-10-CM

## 2017-02-17 DIAGNOSIS — I10 Essential (primary) hypertension: Secondary | ICD-10-CM

## 2017-02-17 MED ORDER — ZOSTER VAC RECOMB ADJUVANTED 50 MCG/0.5ML IM SUSR: 0.5000 mL | Freq: Once | INTRAMUSCULAR | 1 refills | Status: AC

## 2017-02-17 MED ORDER — ROSUVASTATIN CALCIUM 10 MG PO TABS: 10.0000 mg | ORAL_TABLET | Freq: Every day | ORAL | 6 refills | Status: DC

## 2017-02-17 MED ORDER — NEBIVOLOL HCL 10 MG PO TABS: 10.0000 mg | ORAL_TABLET | Freq: Every day | ORAL | 1 refills | Status: DC

## 2017-02-17 MED ORDER — LISINOPRIL 40 MG PO TABS: 40.0000 mg | ORAL_TABLET | Freq: Every day | ORAL | 1 refills | Status: DC

## 2017-02-17 MED ORDER — CLOPIDOGREL BISULFATE 75 MG PO TABS: 75.0000 mg | ORAL_TABLET | Freq: Every day | ORAL | 6 refills | Status: DC
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
## 2017-02-17 NOTE — Patient Instructions (Signed)
Follow up in 2 weeks for fasting labs and appt to follow up blood pressure STOP metoprolol Start bystolic 10 mg by mouth daily  Can take lisinopril 40 mg by mouth daily   DASH Eating Plan DASH stands for "Dietary Approaches to Stop Hypertension." The DASH eating plan is a healthy eating plan that has been shown to reduce high blood pressure (hypertension). It may also reduce your risk for type 2 diabetes, heart disease, and stroke. The DASH eating plan may also help with weight loss. What are tips for following this plan? General guidelines  Avoid eating more than 2,300 mg (milligrams) of salt (sodium) a day. If you have hypertension, you may need to reduce your sodium intake to 1,500 mg a day.  Limit alcohol intake to no more than 1 drink a day for nonpregnant women and 2 drinks a day for men. One drink equals 12 oz of beer, 5 oz of wine, or 1 oz of hard liquor.  Work with your health care provider to maintain a healthy body weight or to lose weight. Ask what an ideal weight is for you.  Get at least 30 minutes of exercise that causes your heart to beat faster (aerobic exercise) most days of the week. Activities may include walking, swimming, or biking.  Work with your health care provider or diet and nutrition specialist (dietitian) to adjust your eating plan to your individual calorie needs. Reading food labels  Check food labels for the amount of sodium per serving. Choose foods with less than 5 percent of the Daily Value of sodium. Generally, foods with less than 300 mg of sodium per serving fit into this eating plan.  To find whole grains, look for the word "whole" as the first word in the ingredient list. Shopping  Buy products labeled as "low-sodium" or "no salt added."  Buy fresh foods. Avoid canned foods and premade or frozen meals. Cooking  Avoid adding salt when cooking. Use salt-free seasonings or herbs instead of table salt or sea salt. Check with your health care  provider or pharmacist before using salt substitutes.  Do not fry foods. Cook foods using healthy methods such as baking, boiling, grilling, and broiling instead.  Cook with heart-healthy oils, such as olive, canola, soybean, or sunflower oil. Meal planning   Eat a balanced diet that includes: ? 5 or more servings of fruits and vegetables each day. At each meal, try to fill half of your plate with fruits and vegetables. ? Up to 6-8 servings of whole grains each day. ? Less than 6 oz of lean meat, poultry, or fish each day. A 3-oz serving of meat is about the same size as a deck of cards. One egg equals 1 oz. ? 2 servings of low-fat dairy each day. ? A serving of nuts, seeds, or beans 5 times each week. ? Heart-healthy fats. Healthy fats called Omega-3 fatty acids are found in foods such as flaxseeds and coldwater fish, like sardines, salmon, and mackerel.  Limit how much you eat of the following: ? Canned or prepackaged foods. ? Food that is high in trans fat, such as fried foods. ? Food that is high in saturated fat, such as fatty meat. ? Sweets, desserts, sugary drinks, and other foods with added sugar. ? Full-fat dairy products.  Do not salt foods before eating.  Try to eat at least 2 vegetarian meals each week.  Eat more home-cooked food and less restaurant, buffet, and fast food.  When eating at  a restaurant, ask that your food be prepared with less salt or no salt, if possible. What foods are recommended? The items listed may not be a complete list. Talk with your dietitian about what dietary choices are best for you. Grains Whole-grain or whole-wheat bread. Whole-grain or whole-wheat pasta. Brown rice. [place]ena Morrow. Bulgur. Whole-grain and low-sodium cereals. Pita bread. Low-fat, low-sodium crackers. Whole-wheat flour tortillas. Vegetables Fresh or frozen vegetables (raw, steamed, roasted, or grilled). Low-sodium or reduced-sodium tomato and vegetable juice. Low-sodium or  reduced-sodium tomato sauce and tomato paste. Low-sodium or reduced-sodium canned vegetables. Fruits All fresh, dried, or frozen fruit. Canned fruit in natural juice (without added sugar). Meat and other protein foods Skinless chicken or [place]. Ground chicken or [place]. Pork with fat trimmed off. Fish and seafood. Egg whites. Dried beans, peas, or lentils. Unsalted nuts, nut butters, and seeds. Unsalted canned beans. Lean cuts of beef with fat trimmed off. Low-sodium, lean deli meat. Dairy Low-fat (1%) or fat-free (skim) milk. Fat-free, low-fat, or reduced-fat cheeses. Nonfat, low-sodium ricotta or cottage cheese. Low-fat or nonfat yogurt. Low-fat, low-sodium cheese. Fats and oils Soft margarine without trans fats. Vegetable oil. Low-fat, reduced-fat, or light mayonnaise and salad dressings (reduced-sodium). Canola, safflower, olive, soybean, and sunflower oils. Avocado. Seasoning and other foods Herbs. Spices. Seasoning mixes without salt. Unsalted popcorn and pretzels. Fat-free sweets. What foods are not recommended? The items listed may not be a complete list. Talk with your dietitian about what dietary choices are best for you. Grains Baked goods made with fat, such as croissants, muffins, or some breads. Dry pasta or rice meal packs. Vegetables Creamed or fried vegetables. Vegetables in a cheese sauce. Regular canned vegetables (not low-sodium or reduced-sodium). Regular canned tomato sauce and paste (not low-sodium or reduced-sodium). Regular tomato and vegetable juice (not low-sodium or reduced-sodium). Angie Fava. Olives. Fruits Canned fruit in a light or heavy syrup. Fried fruit. Fruit in cream or butter sauce. Meat and other protein foods Fatty cuts of meat. Ribs. Fried meat. Berniece Salines. Sausage. Bologna and other processed lunch meats. Salami. Fatback. Hotdogs. Bratwurst. Salted nuts and seeds. Canned beans with added salt. Canned or smoked fish. Whole eggs or egg yolks. Chicken or [place]  with skin. Dairy Whole or 2% milk, cream, and half-and-half. Whole or full-fat cream cheese. Whole-fat or sweetened yogurt. Full-fat cheese. Nondairy creamers. Whipped toppings. Processed cheese and cheese spreads. Fats and oils Butter. Stick margarine. Lard. Shortening. Ghee. Bacon fat. Tropical oils, such as coconut, palm kernel, or palm oil. Seasoning and other foods Salted popcorn and pretzels. Onion salt, garlic salt, seasoned salt, table salt, and sea salt. Worcestershire sauce. Tartar sauce. Barbecue sauce. Teriyaki sauce. Soy sauce, including reduced-sodium. Steak sauce. Canned and packaged gravies. Fish sauce. Oyster sauce. Cocktail sauce. Horseradish that you find on the shelf. Ketchup. Mustard. Meat flavorings and tenderizers. Bouillon cubes. Hot sauce and Tabasco sauce. Premade or packaged marinades. Premade or packaged taco seasonings. Relishes. Regular salad dressings. Where to find more information:  National Heart, Lung, and [place]: https://wilson-eaton.com/  American Heart Association: www.heart.org Summary  The DASH eating plan is a healthy eating plan that has been shown to reduce high blood pressure (hypertension). It may also reduce your risk for type 2 diabetes, heart disease, and stroke.  With the DASH eating plan, you should limit salt (sodium) intake to 2,300 mg a day. If you have hypertension, you may need to reduce your sodium intake to 1,500 mg a day.  When on the DASH eating plan, aim to eat  more fresh fruits and vegetables, whole grains, lean proteins, low-fat dairy, and heart-healthy fats.  Work with your health care provider or diet and nutrition specialist (dietitian) to adjust your eating plan to your individual calorie needs. This information is not intended to replace advice given to you by your health care provider. Make sure you discuss any questions you have with your health care provider. Document Released: 04/18/2011 Document Revised: 04/22/2016  Document Reviewed: 04/22/2016 Elsevier Interactive Patient Education  2017 [employer].

## 2017-02-27 ENCOUNTER — Other Ambulatory Visit: Payer: Medicare Other

## 2017-02-27 DIAGNOSIS — E785 Hyperlipidemia, unspecified: Secondary | ICD-10-CM | POA: Diagnosis not present

## 2017-02-27 DIAGNOSIS — R739 Hyperglycemia, unspecified: Secondary | ICD-10-CM | POA: Diagnosis not present

## 2017-02-27 DIAGNOSIS — I2510 Atherosclerotic heart disease of native coronary artery without angina pectoris: Secondary | ICD-10-CM | POA: Diagnosis not present

## 2017-02-28 LAB — CBC WITH DIFFERENTIAL/PLATELET
Basophils Absolute: 50 cells/uL (ref 0–200)
Basophils Relative: 0.6 %
Eosinophils Absolute: 571 cells/uL — ABNORMAL HIGH (ref 15–500)
Eosinophils Relative: 6.8 %
HCT: 42.3 % (ref 38.5–50.0)
Hemoglobin: 14.6 g/dL (ref 13.2–17.1)
Lymphs Abs: 1739 cells/uL (ref 850–3900)
MCH: 31.8 pg (ref 27.0–33.0)
MCHC: 34.5 g/dL (ref 32.0–36.0)
MCV: 92.2 fL (ref 80.0–100.0)
MPV: 11.1 fL (ref 7.5–12.5)
Monocytes Relative: 9.6 %
Neutro Abs: 5233 cells/uL (ref 1500–7800)
Neutrophils Relative %: 62.3 %
Platelets: 238 10*3/uL (ref 140–400)
RBC: 4.59 10*6/uL (ref 4.20–5.80)
RDW: 12.6 % (ref 11.0–15.0)
Total Lymphocyte: 20.7 %
WBC mixed population: 806 cells/uL (ref 200–950)
WBC: 8.4 10*3/uL (ref 3.8–10.8)

## 2017-02-28 LAB — HEMOGLOBIN A1C
Hgb A1c MFr Bld: 5.7 % of total Hgb — ABNORMAL HIGH (ref <5.7)
Mean Plasma Glucose: 117 (calc)
eAG (mmol/L): 6.5 (calc)

## 2017-02-28 LAB — COMPLETE METABOLIC PANEL WITH GFR
AG Ratio: 2 (calc) (ref 1.0–2.5)
ALT: 39 U/L (ref 9–46)
AST: 31 U/L (ref 10–35)
Albumin: 4.1 g/dL (ref 3.6–5.1)
Alkaline phosphatase (APISO): 52 U/L (ref 40–115)
BUN: 20 mg/dL (ref 7–25)
CO2: 28 mmol/L (ref 20–32)
Calcium: 9 mg/dL (ref 8.6–10.3)
Chloride: 106 mmol/L (ref 98–110)
Creat: 0.74 mg/dL (ref 0.70–1.25)
GFR, Est African American: 110 mL/min/{1.73_m2} (ref >=60)
GFR, Est Non African American: 95 mL/min/{1.73_m2} (ref >=60)
Globulin: 2.1 g/dL (calc) (ref 1.9–3.7)
Glucose, Bld: 103 mg/dL — ABNORMAL HIGH (ref 65–99)
Potassium: 5.1 mmol/L (ref 3.5–5.3)
Sodium: 140 mmol/L (ref 135–146)
Total Bilirubin: 0.6 mg/dL (ref 0.2–1.2)
Total Protein: 6.2 g/dL (ref 6.1–8.1)

## 2017-02-28 LAB — LIPID PANEL
Cholesterol: 140 mg/dL (ref <200)
HDL: 53 mg/dL (ref >40)
LDL Cholesterol (Calc): 65 mg/dL (calc)
Non-HDL Cholesterol (Calc): 87 mg/dL (calc) (ref <130)
Total CHOL/HDL Ratio: 2.6 (calc) (ref <5.0)
Triglycerides: 135 mg/dL (ref <150)

## 2017-03-03 ENCOUNTER — Encounter: Payer: Self-pay | Admitting: Nurse Practitioner

## 2017-03-03 ENCOUNTER — Ambulatory Visit (INDEPENDENT_AMBULATORY_CARE_PROVIDER_SITE_OTHER): Payer: Medicare Other | Admitting: Nurse Practitioner

## 2017-03-03 VITALS — BP 134/82 | HR 62 | Temp 98.2°F | Resp 18 | Ht 70.0 in | Wt 221.2 lb

## 2017-03-03 DIAGNOSIS — I2510 Atherosclerotic heart disease of native coronary artery without angina pectoris: Secondary | ICD-10-CM

## 2017-03-03 DIAGNOSIS — I10 Essential (primary) hypertension: Secondary | ICD-10-CM | POA: Diagnosis not present

## 2017-03-03 MED ORDER — NEBIVOLOL HCL 10 MG PO TABS: 10.0000 mg | ORAL_TABLET | Freq: Every day | ORAL | 1 refills | Status: DC
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
## 2017-03-03 NOTE — Patient Instructions (Signed)
Cont current regimen Follow up in 6 months

## 2017-03-06 DIAGNOSIS — H25812 Combined forms of age-related cataract, left eye: Secondary | ICD-10-CM | POA: Diagnosis not present

## 2017-03-06 DIAGNOSIS — H2512 Age-related nuclear cataract, left eye: Secondary | ICD-10-CM | POA: Diagnosis not present

## 2017-04-07 DIAGNOSIS — H35352 Cystoid macular degeneration, left eye: Secondary | ICD-10-CM | POA: Diagnosis not present

## 2017-04-22 ENCOUNTER — Ambulatory Visit (INDEPENDENT_AMBULATORY_CARE_PROVIDER_SITE_OTHER): Payer: Medicare Other

## 2017-04-22 VITALS — BP 132/60 | HR 71 | Temp 98.0°F | Ht 70.0 in | Wt 224.0 lb

## 2017-04-22 DIAGNOSIS — Z0000 Encounter for general adult medical examination without abnormal findings: Secondary | ICD-10-CM | POA: Diagnosis not present
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
## 2017-04-22 NOTE — Patient Instructions (Signed)
Ivan [name] , Thank you for taking time to come for your Medicare Wellness Visit. I appreciate your ongoing commitment to your health goals. Please review the following plan we discussed and let me know if I can assist you in the future.   Screening recommendations/referrals: Colonoscopy due, please complete cologuard next week Recommended yearly ophthalmology/optometry visit for glaucoma screening and checkup Recommended yearly dental visit for hygiene and checkup  Vaccinations: Influenza vaccine due, will schedule an appointment for next week Pneumococcal vaccine up to date Tdap vaccine up to date. Due 05/13/2017 Shingles vaccine due, please fill your prescription you have    Advanced directives: Please bring [place] a copy of your health care power of attorney and living will  Conditions/risks identified: None  Next appointment: Sherrie Mustache, NP 09/01/2017 @ 1pm  Preventive Care 69 Years and Older, Male Preventive care refers to lifestyle choices and visits with your health care provider that can promote health and wellness. What does preventive care include?  A yearly physical exam. This is also called an annual well check.  Dental exams once or twice a year.  Routine eye exams. Ask your health care provider how often you should have your eyes checked.  Personal lifestyle choices, including:  Daily care of your teeth and gums.  Regular physical activity.  Eating a healthy diet.  Avoiding tobacco and drug use.  Limiting alcohol use.  Practicing safe sex.  Taking low doses of aspirin every day.  Taking vitamin and mineral supplements as recommended by your health care provider. What happens during an annual well check? The services and screenings done by your health care provider during your annual well check will depend on your age, overall health, lifestyle risk factors, and family history of disease. Counseling  Your health care provider may ask you questions about  your:  Alcohol use.  Tobacco use.  Drug use.  Emotional well-being.  Home and relationship well-being.  Sexual activity.  Eating habits.  History of falls.  Memory and ability to understand (cognition).  Work and work Statistician. Screening  You may have the following tests or measurements:  Height, weight, and BMI.  Blood pressure.  Lipid and cholesterol levels. These may be checked every 5 years, or more frequently if you are over 37 years old.  Skin check.  Lung cancer screening. You may have this screening every year starting at age 56 if you have a 30-pack-year history of smoking and currently smoke or have quit within the past 15 years.  Fecal occult blood test (FOBT) of the stool. You may have this test every year starting at age 69.  Flexible sigmoidoscopy or colonoscopy. You may have a sigmoidoscopy every 5 years or a colonoscopy every 10 years starting at age 69.  Prostate cancer screening. Recommendations will vary depending on your family history and other risks.  Hepatitis C blood test.  Hepatitis B blood test.  Sexually transmitted disease (STD) testing.  Diabetes screening. This is done by checking your blood sugar (glucose) after you have not eaten for a while (fasting). You may have this done every 1-3 years.  Abdominal aortic aneurysm (AAA) screening. You may need this if you are a current or former smoker.  Osteoporosis. You may be screened starting at age 70 if you are at high risk. Talk with your health care provider about your test results, treatment options, and if necessary, the need for more tests. Vaccines  Your health care provider may recommend certain vaccines, such as:  Influenza vaccine. This is recommended every year.  Tetanus, diphtheria, and acellular pertussis (Tdap, Td) vaccine. You may need a Td booster every 10 years.  Zoster vaccine. You may need this after age 24.  Pneumococcal 13-valent conjugate (PCV13) vaccine.  One dose is recommended after age 9.  Pneumococcal polysaccharide (PPSV23) vaccine. One dose is recommended after age 72. Talk to your health care provider about which screenings and vaccines you need and how often you need them. This information is not intended to replace advice given to you by your health care provider. Make sure you discuss any questions you have with your health care provider. Document Released: 05/26/2015 Document Revised: 01/17/2016 Document Reviewed: 02/28/2015 Elsevier Interactive Patient Education  2017 [place] Prevention in the Home Falls can cause injuries. They can happen to people of all ages. There are many things you can do to make your home safe and to help prevent falls. What can I do on the outside of my home?  Regularly fix the edges of walkways and driveways and fix any cracks.  Remove anything that might make you trip as you walk through a door, such as a raised step or threshold.  Trim any bushes or trees on the path to your home.  Use bright outdoor lighting.  Clear any walking paths of anything that might make someone trip, such as rocks or tools.  Regularly check to see if handrails are loose or broken. Make sure that both sides of any steps have handrails.  Any raised decks and porches should have guardrails on the edges.  Have any leaves, snow, or ice cleared regularly.  Use sand or salt on walking paths during winter.  Clean up any spills in your garage right away. This includes oil or grease spills. What can I do in the bathroom?  Use night lights.  Install grab bars by the toilet and in the tub and shower. Do not use towel bars as grab bars.  Use non-skid mats or decals in the tub or shower.  If you need to sit down in the shower, use a plastic, non-slip stool.  Keep the floor dry. Clean up any water that spills on the floor as soon as it happens.  Remove soap buildup in the tub or shower regularly.  Attach bath  mats securely with double-sided non-slip rug tape.  Do not have throw rugs and other things on the floor that can make you trip. What can I do in the bedroom?  Use night lights.  Make sure that you have a light by your bed that is easy to reach.  Do not use any sheets or blankets that are too big for your bed. They should not hang down onto the floor.  Have a firm chair that has side arms. You can use this for support while you get dressed.  Do not have throw rugs and other things on the floor that can make you trip. What can I do in the kitchen?  Clean up any spills right away.  Avoid walking on wet floors.  Keep items that you use a lot in easy-to-reach places.  If you need to reach something above you, use a strong step stool that has a grab bar.  Keep electrical cords out of the way.  Do not use floor polish or wax that makes floors slippery. If you must use wax, use non-skid floor wax.  Do not have throw rugs and other things on the floor that  can make you trip. What can I do with my stairs?  Do not leave any items on the stairs.  Make sure that there are handrails on both sides of the stairs and use them. Fix handrails that are broken or loose. Make sure that handrails are as long as the stairways.  Check any carpeting to make sure that it is firmly attached to the stairs. Fix any carpet that is loose or worn.  Avoid having throw rugs at the top or bottom of the stairs. If you do have throw rugs, attach them to the floor with carpet tape.  Make sure that you have a light switch at the top of the stairs and the bottom of the stairs. If you do not have them, ask someone to add them for you. What else can I do to help prevent falls?  Wear shoes that:  Do not have high heels.  Have rubber bottoms.  Are comfortable and fit you well.  Are closed at the toe. Do not wear sandals.  If you use a stepladder:  Make sure that it is fully opened. Do not climb a closed  stepladder.  Make sure that both sides of the stepladder are locked into place.  Ask someone to hold it for you, if possible.  Clearly mark and make sure that you can see:  Any grab bars or handrails.  First and last steps.  Where the edge of each step is.  Use tools that help you move around (mobility aids) if they are needed. These include:  Canes.  Walkers.  Scooters.  Crutches.  Turn on the lights when you go into a dark area. Replace any light bulbs as soon as they burn out.  Set up your furniture so you have a clear path. Avoid moving your furniture around.  If any of your floors are uneven, fix them.  If there are any pets around you, be aware of where they are.  Review your medicines with your doctor. Some medicines can make you feel dizzy. This can increase your chance of falling. Ask your doctor what other things that you can do to help prevent falls. This information is not intended to replace advice given to you by your health care provider. Make sure you discuss any questions you have with your health care provider. Document Released: 02/23/2009 Document Revised: 10/05/2015 Document Reviewed: 06/03/2014 Elsevier Interactive Patient Education  2017 [employer].

## 2017-04-24 ENCOUNTER — Telehealth: Payer: Self-pay

## 2017-04-24 NOTE — Telephone Encounter (Signed)
A fax was received from [employer] stating that they have attempted to reach patient multiple times without success regarding the need for patient to complete cologuard test.   I called patient but had to leave a message asking that patient call the office.

## 2017-04-24 NOTE — Telephone Encounter (Signed)
Cologuard was addressed during AWV on 04/23/17. Patient stated that he did plan to complete cologuard.

## 2017-04-25 DIAGNOSIS — H35352 Cystoid macular degeneration, left eye: Secondary | ICD-10-CM | POA: Diagnosis not present

## 2017-04-30 ENCOUNTER — Ambulatory Visit (INDEPENDENT_AMBULATORY_CARE_PROVIDER_SITE_OTHER): Payer: Medicare Other

## 2017-04-30 ENCOUNTER — Ambulatory Visit: Payer: Medicare Other

## 2017-04-30 DIAGNOSIS — Z23 Encounter for immunization: Secondary | ICD-10-CM

## 2017-05-27 ENCOUNTER — Encounter: Payer: Self-pay | Admitting: Nurse Practitioner

## 2017-05-27 ENCOUNTER — Ambulatory Visit (INDEPENDENT_AMBULATORY_CARE_PROVIDER_SITE_OTHER): Payer: Medicare Other | Admitting: Nurse Practitioner

## 2017-05-27 VITALS — BP 124/72 | HR 64 | Temp 98.6°F | Ht 70.0 in | Wt 228.8 lb

## 2017-05-27 DIAGNOSIS — T148XXA Other injury of unspecified body region, initial encounter: Secondary | ICD-10-CM

## 2017-05-27 DIAGNOSIS — I2510 Atherosclerotic heart disease of native coronary artery without angina pectoris: Secondary | ICD-10-CM | POA: Diagnosis not present

## 2017-05-27 LAB — CBC WITH DIFFERENTIAL/PLATELET
Basophils Absolute: 32 cells/uL (ref 0–200)
Basophils Relative: 0.4 %
Eosinophils Absolute: 373 cells/uL (ref 15–500)
Eosinophils Relative: 4.6 %
HCT: 42.9 % (ref 38.5–50.0)
Hemoglobin: 14.5 g/dL (ref 13.2–17.1)
Lymphs Abs: 1231 cells/uL (ref 850–3900)
MCH: 31.1 pg (ref 27.0–33.0)
MCHC: 33.8 g/dL (ref 32.0–36.0)
MCV: 92.1 fL (ref 80.0–100.0)
MPV: 10.8 fL (ref 7.5–12.5)
Monocytes Relative: 7.8 %
Neutro Abs: 5832 cells/uL (ref 1500–7800)
Neutrophils Relative %: 72 %
Platelets: 246 10*3/uL (ref 140–400)
RBC: 4.66 10*6/uL (ref 4.20–5.80)
RDW: 12.1 % (ref 11.0–15.0)
Total Lymphocyte: 15.2 %
WBC mixed population: 632 cells/uL (ref 200–950)
WBC: 8.1 10*3/uL (ref 3.8–10.8)

## 2017-05-27 MED ORDER — CLOPIDOGREL BISULFATE 75 MG PO TABS: 75.0000 mg | ORAL_TABLET | Freq: Every day | ORAL | 6 refills | Status: DC
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
## 2017-07-28 ENCOUNTER — Other Ambulatory Visit: Payer: Self-pay | Admitting: Nurse Practitioner

## 2017-07-28 NOTE — Telephone Encounter (Signed)
A medication refill was received from pharmacy for tramadol 50 mg. Rx pended to provider due to prescription not being filled since 06/2016. [place] database was verified.

## 2017-09-01 ENCOUNTER — Ambulatory Visit: Payer: Medicare Other | Admitting: Nurse Practitioner

## 2017-10-10 ENCOUNTER — Other Ambulatory Visit: Payer: Self-pay | Admitting: *Deleted

## 2017-10-10 DIAGNOSIS — I10 Essential (primary) hypertension: Secondary | ICD-10-CM

## 2017-10-10 MED ORDER — NEBIVOLOL HCL 10 MG PO TABS: 10.0000 mg | ORAL_TABLET | Freq: Every day | ORAL | 3 refills | Status: DC

## 2017-10-10 NOTE — Telephone Encounter (Signed)
Patient wants to pick up Monday for Mail Order in the [place] which only costs him $1 vs $5.  Printed Rx and placed in [place] folder for signature. Please leave up front, patient will pick up Monday afternoon.

## 2017-10-26 ENCOUNTER — Other Ambulatory Visit: Payer: Self-pay | Admitting: Internal Medicine

## 2017-10-26 DIAGNOSIS — I10 Essential (primary) hypertension: Secondary | ICD-10-CM

## 2017-10-27 MED ORDER — LISINOPRIL 40 MG PO TABS: 40.0000 mg | ORAL_TABLET | Freq: Every day | ORAL | 0 refills | Status: DC

## 2017-11-13 ENCOUNTER — Other Ambulatory Visit: Payer: Self-pay | Admitting: Nurse Practitioner

## 2017-11-13 DIAGNOSIS — I2510 Atherosclerotic heart disease of native coronary artery without angina pectoris: Secondary | ICD-10-CM

## 2017-11-13 DIAGNOSIS — E785 Hyperlipidemia, unspecified: Secondary | ICD-10-CM

## 2017-11-23 ENCOUNTER — Emergency Department (HOSPITAL_COMMUNITY): Payer: Medicare Other

## 2017-11-23 ENCOUNTER — Encounter (HOSPITAL_COMMUNITY): Payer: Self-pay

## 2017-11-23 ENCOUNTER — Emergency Department (HOSPITAL_COMMUNITY)
Admission: EM | Admit: 2017-11-23 | Discharge: 2017-11-23 | Disposition: A | Discharge status: Home / Self Care | Payer: Medicare Other | Attending: Emergency Medicine | Admitting: Emergency Medicine

## 2017-11-23 DIAGNOSIS — R300 Dysuria: Secondary | ICD-10-CM | POA: Diagnosis not present

## 2017-11-23 DIAGNOSIS — I252 Old myocardial infarction: Secondary | ICD-10-CM | POA: Diagnosis not present

## 2017-11-23 DIAGNOSIS — Z85828 Personal history of other malignant neoplasm of skin: Secondary | ICD-10-CM | POA: Diagnosis not present

## 2017-11-23 DIAGNOSIS — Z87891 Personal history of nicotine dependence: Secondary | ICD-10-CM | POA: Diagnosis not present

## 2017-11-23 DIAGNOSIS — R509 Fever, unspecified: Secondary | ICD-10-CM | POA: Diagnosis not present

## 2017-11-23 DIAGNOSIS — Z79899 Other long term (current) drug therapy: Secondary | ICD-10-CM | POA: Diagnosis not present

## 2017-11-23 DIAGNOSIS — I2510 Atherosclerotic heart disease of native coronary artery without angina pectoris: Secondary | ICD-10-CM | POA: Insufficient documentation

## 2017-11-23 DIAGNOSIS — N390 Urinary tract infection, site not specified: Secondary | ICD-10-CM | POA: Insufficient documentation

## 2017-11-23 DIAGNOSIS — I10 Essential (primary) hypertension: Secondary | ICD-10-CM | POA: Diagnosis not present

## 2017-11-23 DIAGNOSIS — Z951 Presence of aortocoronary bypass graft: Secondary | ICD-10-CM | POA: Insufficient documentation

## 2017-11-23 LAB — CBC WITH DIFFERENTIAL/PLATELET
Abs Immature Granulocytes: 0 10*3/uL (ref 0.0–0.1)
Basophils Absolute: 0 10*3/uL (ref 0.0–0.1)
Basophils Relative: 0 %
Eosinophils Absolute: 0.1 10*3/uL (ref 0.0–0.7)
Eosinophils Relative: 1 %
HCT: 46.2 % (ref 39.0–52.0)
Hemoglobin: 14.8 g/dL (ref 13.0–17.0)
Immature Granulocytes: 0 %
Lymphocytes Relative: 4 %
Lymphs Abs: 0.4 10*3/uL — ABNORMAL LOW (ref 0.7–4.0)
MCH: 30.9 pg (ref 26.0–34.0)
MCHC: 32 g/dL (ref 30.0–36.0)
MCV: 96.5 fL (ref 78.0–100.0)
Monocytes Absolute: 0.4 10*3/uL (ref 0.1–1.0)
Monocytes Relative: 3 %
Neutro Abs: 11 10*3/uL — ABNORMAL HIGH (ref 1.7–7.7)
Neutrophils Relative %: 92 %
Platelets: 185 10*3/uL (ref 150–400)
RBC: 4.79 MIL/uL (ref 4.22–5.81)
RDW: 13.2 % (ref 11.5–15.5)
WBC: 11.9 10*3/uL — ABNORMAL HIGH (ref 4.0–10.5)

## 2017-11-23 LAB — COMPREHENSIVE METABOLIC PANEL
ALT: 43 U/L (ref 0–44)
AST: 35 U/L (ref 15–41)
Albumin: 4.2 g/dL (ref 3.5–5.0)
Alkaline Phosphatase: 51 U/L (ref 38–126)
Anion gap: 12 (ref 5–15)
BUN: 18 mg/dL (ref 8–23)
CO2: 20 mmol/L — ABNORMAL LOW (ref 22–32)
Calcium: 8.9 mg/dL (ref 8.9–10.3)
Chloride: 106 mmol/L (ref 98–111)
Creatinine, Ser: 0.86 mg/dL (ref 0.61–1.24)
GFR calc Af Amer: >60 mL/min (ref >60)
GFR calc non Af Amer: >60 mL/min (ref >60)
Glucose, Bld: 128 mg/dL — ABNORMAL HIGH (ref 70–99)
Potassium: 4.3 mmol/L (ref 3.5–5.1)
Sodium: 138 mmol/L (ref 135–145)
Total Bilirubin: 0.6 mg/dL (ref 0.3–1.2)
Total Protein: 6.8 g/dL (ref 6.5–8.1)

## 2017-11-23 LAB — URINALYSIS, ROUTINE W REFLEX MICROSCOPIC
Bilirubin Urine: NEGATIVE
Glucose, UA: NEGATIVE mg/dL
Ketones, ur: 80 mg/dL — AB
Nitrite: NEGATIVE
Protein, ur: NEGATIVE mg/dL
Specific Gravity, Urine: 1.02 (ref 1.005–1.030)
pH: 5.5 (ref 5.0–8.0)

## 2017-11-23 LAB — URINALYSIS, MICROSCOPIC (REFLEX)

## 2017-11-23 LAB — I-STAT CG4 LACTIC ACID, ED
Lactic Acid, Venous: 2.78 mmol/L (ref 0.5–1.9)
Lactic Acid, Venous: 1.07 mmol/L (ref 0.5–1.9)

## 2017-11-23 MED ORDER — SODIUM CHLORIDE 0.9 % IV BOLUS (SEPSIS)
1000.0000 mL | Freq: Once | INTRAVENOUS | Status: AC
  Administered 2017-11-23: 1000 mL via INTRAVENOUS

## 2017-11-23 MED ORDER — CEPHALEXIN 500 MG PO CAPS: ORAL_CAPSULE | ORAL | 0 refills | Status: DC

## 2017-11-23 MED ORDER — SODIUM CHLORIDE 0.9 % IV SOLN
2.0000 g | INTRAVENOUS | Status: DC
  Administered 2017-11-23: 2 g via INTRAVENOUS
  Filled 2017-11-23: qty 20

## 2017-11-23 MED ORDER — CIPROFLOXACIN HCL 500 MG PO TABS: 500.0000 mg | ORAL_TABLET | Freq: Two times a day (BID) | ORAL | 0 refills | Status: DC

## 2017-11-23 NOTE — ED Notes (Signed)
Patient transported to X-ray 

## 2017-11-23 NOTE — Discharge Instructions (Addendum)
Contact a health care provider if: °You have back pain. °You have a fever. °You feel nauseous or vomit. °Your symptoms do not get better after 3 days. °Your symptoms go away and then return. °Get help right away if: °You have severe back pain or lower abdominal pain. °You are vomiting and cannot keep down any medicines or water. °

## 2017-11-23 NOTE — ED Notes (Signed)
Pt still not able to provide urine sample at this time. 

## 2017-11-23 NOTE — ED Notes (Signed)
Pt nurse wanted to wait until more fluids went in will try back later. For Lactic.

## 2017-11-23 NOTE — ED Notes (Signed)
Pt still unable to give urine sample at this time; will try again once fluids go in

## 2017-11-23 NOTE — ED Triage Notes (Addendum)
Pt arrived via [facility]; pt arrived from work, describes painful urination "razor sharp pain;" upon waking up; pt states he had temp of 103.7, EMS states temporal was 102.2; pt took tylenol 1G; HX of MI, HTN, Hyperlipidemia; 156/82, 100, 16
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
## 2017-11-24 LAB — URINE CULTURE

## 2017-11-28 LAB — CULTURE, BLOOD (ROUTINE X 2)
Culture: NO GROWTH
Culture: NO GROWTH
Special Requests: ADEQUATE
Special Requests: ADEQUATE

## 2018-01-20 ENCOUNTER — Telehealth: Payer: Self-pay | Admitting: Nurse Practitioner

## 2018-01-20 NOTE — Telephone Encounter (Signed)
Left msg 01/20/18 asking pt to confirm this AWV-S that was rescheduled from 04/23/18. VDM (DD)

## 2018-01-23 ENCOUNTER — Other Ambulatory Visit: Payer: Self-pay | Admitting: Nurse Practitioner

## 2018-02-13 ENCOUNTER — Telehealth: Payer: Self-pay

## 2018-02-13 NOTE — Telephone Encounter (Signed)
Patient called requesting refill on Tramadol. I had initially informed patient that I will send in RX.   After phone call ended and I reviewed chart and it appears that a refill request came in on 01/23/18 and was denied indicating that patient is overdue for an appointment.   I called patient back asking that he return call Monday to schedule a follow-up. Patient's last OV was Jan 2019, and patient no showed for appointment in April 2019.

## 2018-02-17 NOTE — Telephone Encounter (Signed)
Patient notified and agreed. Scheduled an appointment for Thursday.

## 2018-02-19 ENCOUNTER — Ambulatory Visit (INDEPENDENT_AMBULATORY_CARE_PROVIDER_SITE_OTHER): Payer: Medicare Other | Admitting: Nurse Practitioner

## 2018-02-19 ENCOUNTER — Encounter: Payer: Self-pay | Admitting: Nurse Practitioner

## 2018-02-19 VITALS — BP 152/86 | HR 66 | Temp 98.4°F | Ht 70.0 in | Wt 235.0 lb

## 2018-02-19 DIAGNOSIS — E785 Hyperlipidemia, unspecified: Secondary | ICD-10-CM | POA: Diagnosis not present

## 2018-02-19 DIAGNOSIS — F418 Other specified anxiety disorders: Secondary | ICD-10-CM | POA: Diagnosis not present

## 2018-02-19 DIAGNOSIS — N401 Enlarged prostate with lower urinary tract symptoms: Secondary | ICD-10-CM

## 2018-02-19 DIAGNOSIS — M5136 Other intervertebral disc degeneration, lumbar region: Secondary | ICD-10-CM | POA: Diagnosis not present

## 2018-02-19 DIAGNOSIS — I10 Essential (primary) hypertension: Secondary | ICD-10-CM | POA: Diagnosis not present

## 2018-02-19 DIAGNOSIS — I2510 Atherosclerotic heart disease of native coronary artery without angina pectoris: Secondary | ICD-10-CM | POA: Diagnosis not present

## 2018-02-19 DIAGNOSIS — N138 Other obstructive and reflux uropathy: Secondary | ICD-10-CM

## 2018-02-19 DIAGNOSIS — M51369 Other intervertebral disc degeneration, lumbar region without mention of lumbar back pain or lower extremity pain: Secondary | ICD-10-CM

## 2018-02-19 MED ORDER — CLOPIDOGREL BISULFATE 75 MG PO TABS: 75.0000 mg | ORAL_TABLET | Freq: Every day | ORAL | 2 refills | Status: DC

## 2018-02-19 MED ORDER — FINASTERIDE 5 MG PO TABS: 5.0000 mg | ORAL_TABLET | Freq: Every day | ORAL | 2 refills | Status: DC

## 2018-02-19 MED ORDER — TRAMADOL HCL 50 MG PO TABS: ORAL_TABLET | ORAL | 0 refills | Status: DC

## 2018-02-19 MED ORDER — NEBIVOLOL HCL 20 MG PO TABS: 10.0000 mg | ORAL_TABLET | Freq: Every day | ORAL | 2 refills | Status: DC

## 2018-02-19 NOTE — Patient Instructions (Signed)
To start Proscar 5 mg daily for BPH symptoms.   To limit sodium, elevate legs and wear compression socks/hose during the day  Make appt for fasting labs in the morning.   Follow up in 6 months for routine follow up
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
## 2018-02-20 ENCOUNTER — Other Ambulatory Visit: Payer: Medicare Other

## 2018-02-20 DIAGNOSIS — E785 Hyperlipidemia, unspecified: Secondary | ICD-10-CM | POA: Diagnosis not present

## 2018-02-20 DIAGNOSIS — N138 Other obstructive and reflux uropathy: Secondary | ICD-10-CM

## 2018-02-20 DIAGNOSIS — N401 Enlarged prostate with lower urinary tract symptoms: Principal | ICD-10-CM

## 2018-02-20 LAB — BASIC METABOLIC PANEL WITH GFR
BUN: 19 mg/dL (ref 7–25)
CO2: 27 mmol/L (ref 20–32)
Calcium: 9.6 mg/dL (ref 8.6–10.3)
Chloride: 103 mmol/L (ref 98–110)
Creat: 0.91 mg/dL (ref 0.70–1.25)
GFR, Est African American: 99 mL/min/{1.73_m2} (ref >=60)
GFR, Est Non African American: 86 mL/min/{1.73_m2} (ref >=60)
Glucose, Bld: 103 mg/dL — ABNORMAL HIGH (ref 65–99)
Potassium: 5.1 mmol/L (ref 3.5–5.3)
Sodium: 138 mmol/L (ref 135–146)

## 2018-02-20 LAB — LIPID PANEL
Cholesterol: 139 mg/dL (ref <200)
HDL: 47 mg/dL (ref >40)
LDL Cholesterol (Calc): 73 mg/dL (calc)
Non-HDL Cholesterol (Calc): 92 mg/dL (calc) (ref <130)
Total CHOL/HDL Ratio: 3 (calc) (ref <5.0)
Triglycerides: 107 mg/dL (ref <150)

## 2018-02-20 LAB — PSA: PSA: 1.5 ng/mL (ref <=4.0)

## 2018-03-14 ENCOUNTER — Other Ambulatory Visit: Payer: Self-pay | Admitting: Nurse Practitioner

## 2018-03-14 DIAGNOSIS — I10 Essential (primary) hypertension: Secondary | ICD-10-CM

## 2018-04-07 ENCOUNTER — Telehealth: Payer: Self-pay | Admitting: Nurse Practitioner

## 2018-04-07 NOTE — Telephone Encounter (Signed)
I left a message asking the patient to come at 1:15 instead of 1:00 on Friday, Dec. 13th. VDM (DD)

## 2018-04-23 ENCOUNTER — Ambulatory Visit: Payer: Medicare Other

## 2018-04-24 ENCOUNTER — Telehealth: Payer: Self-pay | Admitting: Nurse Practitioner

## 2018-04-24 ENCOUNTER — Ambulatory Visit: Payer: Medicare Other

## 2018-04-24 NOTE — Telephone Encounter (Signed)
I left a message letting the Ivan [name] know that we need to reschedule his AWV-S for this afternoon due to Clarise Cruz being out sick.  I asked him to confirm if Wed. 12/18 at 10:45 will work. VDM (DD)

## 2018-04-29 ENCOUNTER — Ambulatory Visit: Payer: Medicare Other

## 2018-05-25 ENCOUNTER — Encounter: Payer: Self-pay | Admitting: Nurse Practitioner

## 2018-05-25 ENCOUNTER — Other Ambulatory Visit: Payer: Self-pay | Admitting: Nurse Practitioner

## 2018-05-25 ENCOUNTER — Ambulatory Visit (INDEPENDENT_AMBULATORY_CARE_PROVIDER_SITE_OTHER): Payer: Medicare Other | Admitting: Nurse Practitioner

## 2018-05-25 ENCOUNTER — Ambulatory Visit
Admission: RE | Admit: 2018-05-25 | Discharge: 2018-05-25 | Disposition: A | Discharge status: Home / Self Care | Payer: Medicare Other | Source: Ambulatory Visit | Attending: Nurse Practitioner | Admitting: Nurse Practitioner

## 2018-05-25 VITALS — BP 120/74 | HR 65 | Ht 70.0 in | Wt 228.4 lb

## 2018-05-25 DIAGNOSIS — Z23 Encounter for immunization: Secondary | ICD-10-CM

## 2018-05-25 DIAGNOSIS — M79645 Pain in left finger(s): Secondary | ICD-10-CM

## 2018-05-25 DIAGNOSIS — M19042 Primary osteoarthritis, left hand: Secondary | ICD-10-CM | POA: Diagnosis not present

## 2018-05-25 MED ORDER — TETANUS-DIPHTH-ACELL PERTUSSIS 5-2.5-18.5 LF-MCG/0.5 IM SUSP: 0.5000 mL | Freq: Once | INTRAMUSCULAR | 0 refills | Status: AC

## 2018-05-25 NOTE — Patient Instructions (Addendum)
To use muscle rub as needed (icyhot, bengay, asper cream, or biofreeze)  Will get xray.
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
## 2018-06-01 ENCOUNTER — Ambulatory Visit (INDEPENDENT_AMBULATORY_CARE_PROVIDER_SITE_OTHER): Payer: Medicare Other | Admitting: Orthopaedic Surgery

## 2018-06-01 ENCOUNTER — Encounter: Payer: Self-pay | Admitting: Nurse Practitioner

## 2018-06-01 ENCOUNTER — Encounter (INDEPENDENT_AMBULATORY_CARE_PROVIDER_SITE_OTHER): Payer: Self-pay | Admitting: Orthopaedic Surgery

## 2018-06-01 VITALS — Ht 70.0 in | Wt 228.4 lb

## 2018-06-01 DIAGNOSIS — M79645 Pain in left finger(s): Secondary | ICD-10-CM
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
## 2018-07-31 ENCOUNTER — Other Ambulatory Visit: Payer: Self-pay | Admitting: *Deleted

## 2018-07-31 DIAGNOSIS — I10 Essential (primary) hypertension: Secondary | ICD-10-CM

## 2018-07-31 MED ORDER — NEBIVOLOL HCL 20 MG PO TABS: 10.0000 mg | ORAL_TABLET | Freq: Every day | ORAL | 1 refills | Status: DC

## 2018-07-31 NOTE — Telephone Encounter (Signed)
Patient requested refill. To be faxed to [facility]

## 2018-08-21 ENCOUNTER — Ambulatory Visit: Payer: Medicare Other | Admitting: Nurse Practitioner

## 2018-08-24 ENCOUNTER — Other Ambulatory Visit: Payer: Self-pay

## 2018-08-24 ENCOUNTER — Ambulatory Visit: Payer: Medicare Other | Admitting: Nurse Practitioner

## 2018-08-28 ENCOUNTER — Ambulatory Visit: Payer: Medicare Other | Admitting: Nurse Practitioner

## 2018-08-28 ENCOUNTER — Other Ambulatory Visit: Payer: Self-pay

## 2018-08-28 ENCOUNTER — Ambulatory Visit (INDEPENDENT_AMBULATORY_CARE_PROVIDER_SITE_OTHER): Payer: Medicare Other | Admitting: Nurse Practitioner

## 2018-08-28 ENCOUNTER — Encounter: Payer: Self-pay | Admitting: Nurse Practitioner

## 2018-08-28 DIAGNOSIS — N401 Enlarged prostate with lower urinary tract symptoms: Secondary | ICD-10-CM

## 2018-08-28 DIAGNOSIS — Z1212 Encounter for screening for malignant neoplasm of rectum: Secondary | ICD-10-CM

## 2018-08-28 DIAGNOSIS — I10 Essential (primary) hypertension: Secondary | ICD-10-CM

## 2018-08-28 DIAGNOSIS — F418 Other specified anxiety disorders: Secondary | ICD-10-CM | POA: Diagnosis not present

## 2018-08-28 DIAGNOSIS — E7800 Pure hypercholesterolemia, unspecified: Secondary | ICD-10-CM | POA: Diagnosis not present

## 2018-08-28 DIAGNOSIS — N522 Drug-induced erectile dysfunction: Secondary | ICD-10-CM

## 2018-08-28 DIAGNOSIS — N138 Other obstructive and reflux uropathy: Secondary | ICD-10-CM

## 2018-08-28 DIAGNOSIS — I2510 Atherosclerotic heart disease of native coronary artery without angina pectoris: Secondary | ICD-10-CM

## 2018-08-28 DIAGNOSIS — Z1211 Encounter for screening for malignant neoplasm of colon: Secondary | ICD-10-CM | POA: Diagnosis not present

## 2018-08-28 DIAGNOSIS — M5136 Other intervertebral disc degeneration, lumbar region: Secondary | ICD-10-CM

## 2018-08-28 MED ORDER — FINASTERIDE 5 MG PO TABS: 5.0000 mg | ORAL_TABLET | Freq: Every day | ORAL | 5 refills | Status: DC

## 2018-08-28 MED ORDER — ZOSTER VAC RECOMB ADJUVANTED 50 MCG/0.5ML IM SUSR: 0.5000 mL | Freq: Once | INTRAMUSCULAR | 1 refills | Status: AC

## 2018-08-28 MED ORDER — TADALAFIL 5 MG PO TABS: 5.0000 mg | ORAL_TABLET | Freq: Every day | ORAL | 0 refills | Status: DC | PRN

## 2018-08-28 MED ORDER — TETANUS-DIPHTH-ACELL PERTUSSIS 5-2.5-18.5 LF-MCG/0.5 IM SUSP: 0.5000 mL | Freq: Once | INTRAMUSCULAR | 0 refills | Status: AC

## 2018-08-28 NOTE — Patient Instructions (Addendum)
Follow up in 6 months with lab work prior to appt.  What is this medicine? TADALAFIL (tah DA la fil) is used to treat erection problems in men. It is also used for enlargement of the prostate gland in men, a condition called benign prostatic hyperplasia or BPH. This medicine improves urine flow and reduces BPH symptoms. This medicine can also treat both erection problems and BPH when they occur together. This medicine may be used for other purposes; ask your health care provider or pharmacist if you have questions. COMMON BRAND NAME(S): Kathaleen Bury, Cialis What should I tell my health care provider before I take this medicine? They need to know if you have any of these conditions: -bleeding disorders -eye or vision problems, including a rare inherited eye disease called retinitis pigmentosa -anatomical deformation of the penis, Peyronie's disease, or history of priapism (painful and prolonged erection) -heart disease, angina, a history of heart attack, irregular heart beats, or other heart problems -high or low blood pressure -history of blood diseases, like sickle cell anemia or leukemia -history of stomach bleeding -kidney disease -liver disease -stroke -an unusual or allergic reaction to tadalafil, other medicines, foods, dyes, or preservatives -pregnant or trying to get pregnant -breast-feeding How should I use this medicine? Take this medicine by mouth with a glass of water. Follow the directions on the prescription label. You may take this medicine with or without meals. When this medicine is used for erection problems, your doctor may prescribe it to be taken once daily or as needed. If you are taking the medicine as needed, you may be able to have sexual activity 30 minutes after taking it and for up to 36 hours after taking it. Whether you are taking the medicine as needed or once daily, you should not take more than one dose per day. If you are taking this medicine for symptoms of  benign prostatic hyperplasia (BPH) or to treat both BPH and an erection problem, take the dose once daily at about the same time each day. Do not take your medicine more often than directed. Talk to your pediatrician regarding the use of this medicine in children. Special care may be needed. Overdosage: If you think you have taken too much of this medicine contact a poison control center or emergency room at once. NOTE: This medicine is only for you. Do not share this medicine with others. What if I miss a dose? If you are taking this medicine as needed for erection problems, this does not apply. If you miss a dose while taking this medicine once daily for an erection problem, benign prostatic hyperplasia, or both, take it as soon as you remember, but do not take more than one dose per day. What may interact with this medicine? Do not take this medicine with any of the following medications: -nitrates like amyl nitrite, isosorbide dinitrate, isosorbide mononitrate, nitroglycerin -other medicines for erectile dysfunction like avanafil, sildenafil, vardenafil -other tadalafil products (Adcirca) -riociguat This medicine may also interact with the following medications: -certain drugs for high blood pressure -certain drugs for the treatment of HIV infection or AIDS -certain drugs used for fungal or yeast infections, like fluconazole, itraconazole, ketoconazole, and voriconazole -certain drugs used for seizures like carbamazepine, phenytoin, and phenobarbital -grapefruit juice -macrolide antibiotics like clarithromycin, erythromycin, troleandomycin -medicines for prostate problems -rifabutin, rifampin or rifapentine This list may not describe all possible interactions. Give your health care provider a list of all the medicines, herbs, non-prescription drugs, or dietary supplements you  use. Also tell them if you smoke, drink alcohol, or use illegal drugs. Some items may interact with your  medicine. What should I watch for while using this medicine? If you notice any changes in your vision while taking this drug, call your doctor or health care professional as soon as possible. Stop using this medicine and call your health care provider right away if you have a loss of sight in one or both eyes. Contact your doctor or health care professional right away if the erection lasts longer than 4 hours or if it becomes painful. This may be a sign of serious problem and must be treated right away to prevent permanent damage. If you experience symptoms of nausea, dizziness, chest pain or arm pain upon initiation of sexual activity after taking this medicine, you should refrain from further activity and call your doctor or health care professional as soon as possible. Do not drink alcohol to excess (examples, 5 glasses of wine or 5 shots of whiskey) when taking this medicine. When taken in excess, alcohol can increase your chances of getting a headache or getting dizzy, increasing your heart rate or lowering your blood pressure. Using this medicine does not protect you or your partner against HIV infection (the virus that causes AIDS) or other sexually transmitted diseases. What side effects may I notice from receiving this medicine? Side effects that you should report to your doctor or health care professional as soon as possible: -allergic reactions like skin rash, itching or hives, swelling of the face, lips, or tongue -breathing problems -changes in hearing -changes in vision -chest pain -fast, irregular heartbeat -prolonged or painful erection -seizures Side effects that usually do not require medical attention (report to your doctor or health care professional if they continue or are bothersome): -back pain -dizziness -flushing -headache -indigestion -muscle aches -nausea -stuffy or runny nose This list may not describe all possible side effects. Call your doctor for medical  advice about side effects. You may report side effects to FDA at 1-800-FDA-1088. Where should I keep my medicine? Keep out of the reach of children. Store at room temperature between 15 and 30 degrees C (59 and 86 degrees F). Throw away any unused medicine after the expiration date. NOTE: This sheet is a summary. It may not cover all possible information. If you have questions about this medicine, talk to your doctor, pharmacist, or health care provider.  2019 Elsevier/Gold Standard (2013-09-17 13:15:49)
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
## 2018-10-07 ENCOUNTER — Other Ambulatory Visit: Payer: Self-pay | Admitting: Nurse Practitioner

## 2018-10-07 DIAGNOSIS — I10 Essential (primary) hypertension: Secondary | ICD-10-CM

## 2018-11-02 ENCOUNTER — Other Ambulatory Visit: Payer: Self-pay | Admitting: *Deleted

## 2018-11-02 DIAGNOSIS — M5136 Other intervertebral disc degeneration, lumbar region: Secondary | ICD-10-CM

## 2018-11-02 MED ORDER — TRAMADOL HCL 50 MG PO TABS: ORAL_TABLET | ORAL | 0 refills | Status: DC

## 2018-11-02 NOTE — Telephone Encounter (Signed)
Patient requested refill [place] Verified LR: 02/19/2018 Pended Rx and sent to [facility] for approval.

## 2018-12-02 ENCOUNTER — Other Ambulatory Visit: Payer: Self-pay | Admitting: *Deleted

## 2018-12-02 DIAGNOSIS — N522 Drug-induced erectile dysfunction: Secondary | ICD-10-CM

## 2018-12-02 MED ORDER — TADALAFIL 5 MG PO TABS: 5.0000 mg | ORAL_TABLET | Freq: Every day | ORAL | 0 refills | Status: DC | PRN

## 2018-12-02 NOTE — Telephone Encounter (Signed)
Patient requested refill on medication.

## 2018-12-28 ENCOUNTER — Other Ambulatory Visit: Payer: Self-pay | Admitting: Nurse Practitioner

## 2018-12-28 DIAGNOSIS — E785 Hyperlipidemia, unspecified: Secondary | ICD-10-CM

## 2018-12-28 DIAGNOSIS — I2510 Atherosclerotic heart disease of native coronary artery without angina pectoris: Secondary | ICD-10-CM

## 2019-01-17 ENCOUNTER — Other Ambulatory Visit: Payer: Self-pay | Admitting: Nurse Practitioner

## 2019-01-17 DIAGNOSIS — I2510 Atherosclerotic heart disease of native coronary artery without angina pectoris: Secondary | ICD-10-CM

## 2019-01-17 DIAGNOSIS — E785 Hyperlipidemia, unspecified: Secondary | ICD-10-CM

## 2019-01-21 ENCOUNTER — Other Ambulatory Visit: Payer: Self-pay | Admitting: Nurse Practitioner

## 2019-01-21 DIAGNOSIS — I10 Essential (primary) hypertension: Secondary | ICD-10-CM

## 2019-01-26 DIAGNOSIS — Z125 Encounter for screening for malignant neoplasm of prostate: Secondary | ICD-10-CM | POA: Diagnosis not present

## 2019-01-26 DIAGNOSIS — Z1159 Encounter for screening for other viral diseases: Secondary | ICD-10-CM | POA: Diagnosis not present

## 2019-01-26 DIAGNOSIS — R3989 Other symptoms and signs involving the genitourinary system: Secondary | ICD-10-CM | POA: Diagnosis not present

## 2019-01-26 DIAGNOSIS — M129 Arthropathy, unspecified: Secondary | ICD-10-CM | POA: Diagnosis not present

## 2019-01-26 DIAGNOSIS — L299 Pruritus, unspecified: Secondary | ICD-10-CM | POA: Diagnosis not present

## 2019-01-26 DIAGNOSIS — E559 Vitamin D deficiency, unspecified: Secondary | ICD-10-CM | POA: Diagnosis not present

## 2019-01-26 DIAGNOSIS — Z131 Encounter for screening for diabetes mellitus: Secondary | ICD-10-CM | POA: Diagnosis not present

## 2019-01-26 DIAGNOSIS — R300 Dysuria: Secondary | ICD-10-CM | POA: Diagnosis not present

## 2019-01-26 DIAGNOSIS — R5383 Other fatigue: Secondary | ICD-10-CM | POA: Diagnosis not present

## 2019-01-26 DIAGNOSIS — E7800 Pure hypercholesterolemia, unspecified: Secondary | ICD-10-CM | POA: Diagnosis not present
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
## 2019-01-29 ENCOUNTER — Telehealth (INDEPENDENT_AMBULATORY_CARE_PROVIDER_SITE_OTHER): Payer: Medicare Other | Admitting: Physician Assistant

## 2019-01-29 ENCOUNTER — Other Ambulatory Visit: Payer: Self-pay

## 2019-01-29 ENCOUNTER — Encounter: Payer: Self-pay | Admitting: Physician Assistant

## 2019-01-29 VITALS — BP 120/64 | HR 64 | Ht 70.0 in | Wt 220.0 lb

## 2019-01-29 DIAGNOSIS — E7800 Pure hypercholesterolemia, unspecified: Secondary | ICD-10-CM

## 2019-01-29 DIAGNOSIS — I2510 Atherosclerotic heart disease of native coronary artery without angina pectoris: Secondary | ICD-10-CM

## 2019-01-29 DIAGNOSIS — M5136 Other intervertebral disc degeneration, lumbar region: Secondary | ICD-10-CM | POA: Diagnosis not present

## 2019-01-29 DIAGNOSIS — M5416 Radiculopathy, lumbar region: Secondary | ICD-10-CM | POA: Diagnosis not present

## 2019-01-29 DIAGNOSIS — I10 Essential (primary) hypertension: Secondary | ICD-10-CM

## 2019-01-29 DIAGNOSIS — Z7189 Other specified counseling: Secondary | ICD-10-CM

## 2019-01-29 DIAGNOSIS — M545 Low back pain: Secondary | ICD-10-CM | POA: Diagnosis not present

## 2019-01-29 MED ORDER — ROSUVASTATIN CALCIUM 10 MG PO TABS: 10.0000 mg | ORAL_TABLET | Freq: Every day | ORAL | 11 refills | Status: DC

## 2019-01-29 NOTE — Patient Instructions (Signed)
Medication Instructions:  Your physician recommends that you continue on your current medications as directed. Please refer to the Current Medication list given to you today.  If you need a refill on your cardiac medications before your next appointment, please call your pharmacy.   Lab work: None (Please have your primary care physician send [place] a copy of your lab results)  If you have labs (blood work) drawn today and your tests are completely normal, you will receive your results only by: Marland Kitchen MyChart Message (if you have MyChart) OR . A paper copy in the mail If you have any lab test that is abnormal or we need to change your treatment, we will call you to review the results.  Testing/Procedures: None ordered  Follow-Up: At [facility], you and your health needs are our priority.  As part of our continuing mission to provide you with exceptional heart care, we have created designated Provider Care Teams.  These Care Teams include your primary Cardiologist (physician) and Advanced Practice Providers (APPs -  Physician Assistants and Nurse Practitioners) who all work together to provide you with the care you need, when you need it. You will need a follow up appointment in:  12 months.  Please call our office 2 months in advance to schedule this appointment.  You may see Mertie Moores, MD or one of the following Advanced Practice Providers on your designated Care Team: Richardson Dopp, PA-C [place], [place] . Daune Perch, NP  Any Other Special Instructions Will Be Listed Below (If Applicable).

## 2019-02-08 ENCOUNTER — Other Ambulatory Visit: Payer: Self-pay | Admitting: Orthopaedic Surgery

## 2019-02-08 DIAGNOSIS — M5136 Other intervertebral disc degeneration, lumbar region: Secondary | ICD-10-CM

## 2019-02-09 DIAGNOSIS — N390 Urinary tract infection, site not specified: Secondary | ICD-10-CM | POA: Diagnosis not present

## 2019-02-09 DIAGNOSIS — R945 Abnormal results of liver function studies: Secondary | ICD-10-CM | POA: Diagnosis not present

## 2019-02-09 DIAGNOSIS — E559 Vitamin D deficiency, unspecified: Secondary | ICD-10-CM | POA: Diagnosis not present

## 2019-02-09 DIAGNOSIS — L299 Pruritus, unspecified: Secondary | ICD-10-CM | POA: Diagnosis not present

## 2019-02-09 DIAGNOSIS — A499 Bacterial infection, unspecified: Secondary | ICD-10-CM | POA: Diagnosis not present

## 2019-02-09 DIAGNOSIS — Z79899 Other long term (current) drug therapy: Secondary | ICD-10-CM | POA: Diagnosis not present

## 2019-02-11 DIAGNOSIS — R945 Abnormal results of liver function studies: Secondary | ICD-10-CM | POA: Diagnosis not present

## 2019-02-16 ENCOUNTER — Other Ambulatory Visit: Payer: Self-pay | Admitting: Nurse Practitioner

## 2019-02-16 DIAGNOSIS — I10 Essential (primary) hypertension: Secondary | ICD-10-CM

## 2019-02-18 ENCOUNTER — Ambulatory Visit
Admission: RE | Admit: 2019-02-18 | Discharge: 2019-02-18 | Disposition: A | Discharge status: Home / Self Care | Payer: Medicare Other | Source: Ambulatory Visit | Attending: Orthopaedic Surgery | Admitting: Orthopaedic Surgery

## 2019-02-18 DIAGNOSIS — M5136 Other intervertebral disc degeneration, lumbar region: Secondary | ICD-10-CM

## 2019-02-18 DIAGNOSIS — M48061 Spinal stenosis, lumbar region without neurogenic claudication: Secondary | ICD-10-CM | POA: Diagnosis not present

## 2019-02-24 ENCOUNTER — Other Ambulatory Visit: Payer: Self-pay | Admitting: Nurse Practitioner

## 2019-02-24 NOTE — Telephone Encounter (Signed)
Patient requesting refill but medication is not listed on patient's current medication list. Routing to provider for further review and approval. Listed on patient's office visit with provider dated 08/28/2018.

## 2019-02-26 ENCOUNTER — Other Ambulatory Visit: Payer: Medicare Other

## 2019-03-02 ENCOUNTER — Ambulatory Visit: Payer: Medicare Other | Admitting: Nurse Practitioner

## 2019-03-03 ENCOUNTER — Ambulatory Visit: Payer: Medicare Other | Admitting: Nurse Practitioner

## 2019-03-11 DIAGNOSIS — I714 Abdominal aortic aneurysm, without rupture: Secondary | ICD-10-CM | POA: Diagnosis not present

## 2019-03-11 DIAGNOSIS — K700 Alcoholic fatty liver: Secondary | ICD-10-CM | POA: Diagnosis not present

## 2019-03-11 DIAGNOSIS — K760 Fatty (change of) liver, not elsewhere classified: Secondary | ICD-10-CM | POA: Diagnosis not present

## 2019-03-11 DIAGNOSIS — N1330 Unspecified hydronephrosis: Secondary | ICD-10-CM | POA: Diagnosis not present

## 2019-03-17 ENCOUNTER — Ambulatory Visit: Payer: Medicare Other | Admitting: Nurse Practitioner

## 2019-03-17 DIAGNOSIS — M5136 Other intervertebral disc degeneration, lumbar region: Secondary | ICD-10-CM | POA: Diagnosis not present

## 2019-03-17 DIAGNOSIS — M5416 Radiculopathy, lumbar region: Secondary | ICD-10-CM | POA: Diagnosis not present

## 2019-03-30 DIAGNOSIS — M5136 Other intervertebral disc degeneration, lumbar region: Secondary | ICD-10-CM | POA: Diagnosis not present

## 2019-03-31 DIAGNOSIS — R3129 Other microscopic hematuria: Secondary | ICD-10-CM | POA: Diagnosis not present

## 2019-04-07 DIAGNOSIS — M545 Low back pain: Secondary | ICD-10-CM | POA: Diagnosis not present

## 2019-04-07 DIAGNOSIS — G8929 Other chronic pain: Secondary | ICD-10-CM | POA: Diagnosis not present

## 2019-04-07 DIAGNOSIS — K700 Alcoholic fatty liver: Secondary | ICD-10-CM | POA: Diagnosis not present

## 2019-04-07 DIAGNOSIS — Z79899 Other long term (current) drug therapy: Secondary | ICD-10-CM | POA: Diagnosis not present

## 2019-04-07 DIAGNOSIS — K760 Fatty (change of) liver, not elsewhere classified: Secondary | ICD-10-CM | POA: Diagnosis not present

## 2019-05-14 ENCOUNTER — Encounter (HOSPITAL_COMMUNITY): Payer: Self-pay | Admitting: Emergency Medicine

## 2019-05-14 ENCOUNTER — Emergency Department (HOSPITAL_COMMUNITY)
Admission: EM | Admit: 2019-05-14 | Discharge: 2019-05-14 | Disposition: A | Discharge status: Home / Self Care | Payer: Medicare HMO | Attending: Emergency Medicine | Admitting: Emergency Medicine

## 2019-05-14 ENCOUNTER — Emergency Department (HOSPITAL_COMMUNITY): Payer: Medicare HMO

## 2019-05-14 ENCOUNTER — Other Ambulatory Visit: Payer: Self-pay

## 2019-05-14 DIAGNOSIS — Z951 Presence of aortocoronary bypass graft: Secondary | ICD-10-CM | POA: Diagnosis not present

## 2019-05-14 DIAGNOSIS — I10 Essential (primary) hypertension: Secondary | ICD-10-CM | POA: Insufficient documentation

## 2019-05-14 DIAGNOSIS — Z87891 Personal history of nicotine dependence: Secondary | ICD-10-CM | POA: Insufficient documentation

## 2019-05-14 DIAGNOSIS — S71002A Unspecified open wound, left hip, initial encounter: Secondary | ICD-10-CM

## 2019-05-14 DIAGNOSIS — Z8673 Personal history of transient ischemic attack (TIA), and cerebral infarction without residual deficits: Secondary | ICD-10-CM | POA: Insufficient documentation

## 2019-05-14 DIAGNOSIS — Z7982 Long term (current) use of aspirin: Secondary | ICD-10-CM | POA: Diagnosis not present

## 2019-05-14 DIAGNOSIS — Z85828 Personal history of other malignant neoplasm of skin: Secondary | ICD-10-CM | POA: Insufficient documentation

## 2019-05-14 DIAGNOSIS — Z79899 Other long term (current) drug therapy: Secondary | ICD-10-CM | POA: Diagnosis not present

## 2019-05-14 DIAGNOSIS — R319 Hematuria, unspecified: Secondary | ICD-10-CM | POA: Insufficient documentation

## 2019-05-14 DIAGNOSIS — I2510 Atherosclerotic heart disease of native coronary artery without angina pectoris: Secondary | ICD-10-CM | POA: Insufficient documentation

## 2019-05-14 DIAGNOSIS — Z87442 Personal history of urinary calculi: Secondary | ICD-10-CM

## 2019-05-14 HISTORY — DX: Unspecified open wound, left hip, initial encounter: S71.002A

## 2019-05-14 HISTORY — DX: Personal history of urinary calculi: Z87.442

## 2019-05-14 LAB — BASIC METABOLIC PANEL
Anion gap: 14 (ref 5–15)
BUN: 18 mg/dL (ref 8–23)
CO2: 21 mmol/L — ABNORMAL LOW (ref 22–32)
Calcium: 8.5 mg/dL — ABNORMAL LOW (ref 8.9–10.3)
Chloride: 98 mmol/L (ref 98–111)
Creatinine, Ser: 0.8 mg/dL (ref 0.61–1.24)
GFR calc Af Amer: >60 mL/min (ref >60)
GFR calc non Af Amer: >60 mL/min (ref >60)
Glucose, Bld: 127 mg/dL — ABNORMAL HIGH (ref 70–99)
Potassium: 4 mmol/L (ref 3.5–5.1)
Sodium: 133 mmol/L — ABNORMAL LOW (ref 135–145)

## 2019-05-14 LAB — URINALYSIS, ROUTINE W REFLEX MICROSCOPIC
Bilirubin Urine: NEGATIVE
Glucose, UA: NEGATIVE mg/dL
Ketones, ur: 20 mg/dL — AB
Leukocytes,Ua: NEGATIVE
Nitrite: NEGATIVE
Protein, ur: 30 mg/dL — AB
RBC / HPF: >50 RBC/hpf — ABNORMAL HIGH (ref 0–5)
Specific Gravity, Urine: 1.024 (ref 1.005–1.030)
pH: 5 (ref 5.0–8.0)

## 2019-05-14 LAB — CBC
HCT: 44.7 % (ref 39.0–52.0)
Hemoglobin: 15.1 g/dL (ref 13.0–17.0)
MCH: 32 pg (ref 26.0–34.0)
MCHC: 33.8 g/dL (ref 30.0–36.0)
MCV: 94.7 fL (ref 80.0–100.0)
Platelets: 150 10*3/uL (ref 150–400)
RBC: 4.72 MIL/uL (ref 4.22–5.81)
RDW: 11.9 % (ref 11.5–15.5)
WBC: 5.8 10*3/uL (ref 4.0–10.5)
nRBC: 0 % (ref 0.0–0.2)

## 2019-05-14 MED ORDER — ONDANSETRON 4 MG PO TBDP
4.0000 mg | ORAL_TABLET | Freq: Once | ORAL | Status: AC
  Administered 2019-05-14: 4 mg via ORAL
  Filled 2019-05-14: qty 1

## 2019-05-14 MED ORDER — CEPHALEXIN 500 MG PO CAPS: 500.0000 mg | ORAL_CAPSULE | Freq: Four times a day (QID) | ORAL | 0 refills | Status: DC

## 2019-05-14 MED ORDER — ACETAMINOPHEN 500 MG PO TABS
1000.0000 mg | ORAL_TABLET | Freq: Once | ORAL | Status: AC
  Administered 2019-05-14: 1000 mg via ORAL
  Filled 2019-05-14: qty 2

## 2019-05-14 NOTE — ED Triage Notes (Signed)
Pt reports that when he got up to urinate this morning "it felt like I was peeing razor blades."  Also reports flank pain.  Was seen by PCP last week, no referral for specialist yet.
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
## 2019-05-14 NOTE — Discharge Instructions (Signed)
CAT scan today showed that you did have stones in your kidneys but there is no sign of a kidney stone that stuck right now.  Your urine showed blood but there is no evidence of infection.  A prescription for an antibiotic was sent to your pharmacy but you only need to fill that if the symptoms continue or you start having fever and foul-smelling urine.  It is important that you follow-up with the urologist so they can do more evaluation of what is causing the bleeding.  Also touch base with Dr. Royce Macadamia so they can continue helping to make sure that you are able to be seen by the specialist.

## 2019-05-14 NOTE — ED Notes (Signed)
Bladder scan showed 18ml

## 2019-05-14 NOTE — ED Notes (Signed)
Patient transported to CT 

## 2019-05-16 LAB — URINE CULTURE: Culture: >=100000 — AB

## 2019-05-18 ENCOUNTER — Other Ambulatory Visit: Payer: Self-pay | Admitting: Nurse Practitioner

## 2019-05-18 ENCOUNTER — Emergency Department (HOSPITAL_COMMUNITY): Payer: Medicare HMO

## 2019-05-18 ENCOUNTER — Inpatient Hospital Stay (HOSPITAL_COMMUNITY)
Admission: RE | Admit: 2019-05-18 | Discharge: 2019-05-22 | DRG: 177 | Discharge status: Against Medical Advice | Payer: Medicare HMO | Attending: Family Medicine | Admitting: Family Medicine

## 2019-05-18 DIAGNOSIS — R4781 Slurred speech: Secondary | ICD-10-CM | POA: Diagnosis not present

## 2019-05-18 DIAGNOSIS — Z808 Family history of malignant neoplasm of other organs or systems: Secondary | ICD-10-CM

## 2019-05-18 DIAGNOSIS — Z5329 Procedure and treatment not carried out because of patient's decision for other reasons: Secondary | ICD-10-CM

## 2019-05-18 DIAGNOSIS — I252 Old myocardial infarction: Secondary | ICD-10-CM

## 2019-05-18 DIAGNOSIS — G47419 Narcolepsy without cataplexy: Secondary | ICD-10-CM | POA: Diagnosis present

## 2019-05-18 DIAGNOSIS — Z981 Arthrodesis status: Secondary | ICD-10-CM

## 2019-05-18 DIAGNOSIS — G9341 Metabolic encephalopathy: Secondary | ICD-10-CM

## 2019-05-18 DIAGNOSIS — F05 Delirium due to known physiological condition: Secondary | ICD-10-CM | POA: Diagnosis present

## 2019-05-18 DIAGNOSIS — I10 Essential (primary) hypertension: Secondary | ICD-10-CM | POA: Diagnosis present

## 2019-05-18 DIAGNOSIS — E7800 Pure hypercholesterolemia, unspecified: Secondary | ICD-10-CM | POA: Diagnosis present

## 2019-05-18 DIAGNOSIS — N400 Enlarged prostate without lower urinary tract symptoms: Secondary | ICD-10-CM | POA: Diagnosis present

## 2019-05-18 DIAGNOSIS — Z79899 Other long term (current) drug therapy: Secondary | ICD-10-CM

## 2019-05-18 DIAGNOSIS — S7002XA Contusion of left hip, initial encounter: Secondary | ICD-10-CM | POA: Diagnosis present

## 2019-05-18 DIAGNOSIS — U071 COVID-19: Secondary | ICD-10-CM | POA: Diagnosis present

## 2019-05-18 DIAGNOSIS — R531 Weakness: Secondary | ICD-10-CM

## 2019-05-18 DIAGNOSIS — M5136 Other intervertebral disc degeneration, lumbar region: Secondary | ICD-10-CM | POA: Diagnosis present

## 2019-05-18 DIAGNOSIS — Z91041 Radiographic dye allergy status: Secondary | ICD-10-CM

## 2019-05-18 DIAGNOSIS — R4182 Altered mental status, unspecified: Secondary | ICD-10-CM | POA: Diagnosis present

## 2019-05-18 DIAGNOSIS — F4310 Post-traumatic stress disorder, unspecified: Secondary | ICD-10-CM | POA: Diagnosis present

## 2019-05-18 DIAGNOSIS — G92 Toxic encephalopathy: Secondary | ICD-10-CM | POA: Diagnosis present

## 2019-05-18 DIAGNOSIS — N138 Other obstructive and reflux uropathy: Secondary | ICD-10-CM

## 2019-05-18 DIAGNOSIS — G4700 Insomnia, unspecified: Secondary | ICD-10-CM | POA: Diagnosis present

## 2019-05-18 DIAGNOSIS — E785 Hyperlipidemia, unspecified: Secondary | ICD-10-CM | POA: Diagnosis present

## 2019-05-18 DIAGNOSIS — K760 Fatty (change of) liver, not elsewhere classified: Secondary | ICD-10-CM | POA: Diagnosis present

## 2019-05-18 DIAGNOSIS — M6282 Rhabdomyolysis: Secondary | ICD-10-CM | POA: Diagnosis present

## 2019-05-18 DIAGNOSIS — Z8673 Personal history of transient ischemic attack (TIA), and cerebral infarction without residual deficits: Secondary | ICD-10-CM

## 2019-05-18 DIAGNOSIS — Z7902 Long term (current) use of antithrombotics/antiplatelets: Secondary | ICD-10-CM

## 2019-05-18 DIAGNOSIS — J1282 Pneumonia due to coronavirus disease 2019: Secondary | ICD-10-CM | POA: Diagnosis present

## 2019-05-18 DIAGNOSIS — R748 Abnormal levels of other serum enzymes: Secondary | ICD-10-CM

## 2019-05-18 DIAGNOSIS — B952 Enterococcus as the cause of diseases classified elsewhere: Secondary | ICD-10-CM | POA: Diagnosis present

## 2019-05-18 DIAGNOSIS — F329 Major depressive disorder, single episode, unspecified: Secondary | ICD-10-CM | POA: Diagnosis present

## 2019-05-18 DIAGNOSIS — Z7982 Long term (current) use of aspirin: Secondary | ICD-10-CM

## 2019-05-18 DIAGNOSIS — W19XXXA Unspecified fall, initial encounter: Secondary | ICD-10-CM | POA: Diagnosis present

## 2019-05-18 DIAGNOSIS — Z85828 Personal history of other malignant neoplasm of skin: Secondary | ICD-10-CM

## 2019-05-18 DIAGNOSIS — F418 Other specified anxiety disorders: Secondary | ICD-10-CM | POA: Diagnosis present

## 2019-05-18 DIAGNOSIS — Z87442 Personal history of urinary calculi: Secondary | ICD-10-CM

## 2019-05-18 DIAGNOSIS — Z951 Presence of aortocoronary bypass graft: Secondary | ICD-10-CM

## 2019-05-18 DIAGNOSIS — I2510 Atherosclerotic heart disease of native coronary artery without angina pectoris: Secondary | ICD-10-CM | POA: Diagnosis present

## 2019-05-18 DIAGNOSIS — S301XXA Contusion of abdominal wall, initial encounter: Secondary | ICD-10-CM | POA: Diagnosis present

## 2019-05-18 DIAGNOSIS — Z87891 Personal history of nicotine dependence: Secondary | ICD-10-CM

## 2019-05-18 DIAGNOSIS — Z8616 Personal history of COVID-19: Secondary | ICD-10-CM

## 2019-05-18 DIAGNOSIS — M5134 Other intervertebral disc degeneration, thoracic region: Secondary | ICD-10-CM | POA: Diagnosis present

## 2019-05-18 DIAGNOSIS — N390 Urinary tract infection, site not specified: Secondary | ICD-10-CM | POA: Diagnosis present

## 2019-05-18 HISTORY — DX: Personal history of COVID-19: Z86.16

## 2019-05-18 LAB — CBC WITH DIFFERENTIAL/PLATELET
Abs Immature Granulocytes: 0.06 10*3/uL (ref 0.00–0.07)
Basophils Absolute: 0 10*3/uL (ref 0.0–0.1)
Basophils Relative: 0 %
Eosinophils Absolute: 0 10*3/uL (ref 0.0–0.5)
Eosinophils Relative: 0 %
HCT: 53.5 % — ABNORMAL HIGH (ref 39.0–52.0)
Hemoglobin: 17.9 g/dL — ABNORMAL HIGH (ref 13.0–17.0)
Immature Granulocytes: 1 %
Lymphocytes Relative: 2 %
Lymphs Abs: 0.3 10*3/uL — ABNORMAL LOW (ref 0.7–4.0)
MCH: 32.4 pg (ref 26.0–34.0)
MCHC: 33.5 g/dL (ref 30.0–36.0)
MCV: 96.9 fL (ref 80.0–100.0)
Monocytes Absolute: 0.9 10*3/uL (ref 0.1–1.0)
Monocytes Relative: 7 %
Neutro Abs: 11 10*3/uL — ABNORMAL HIGH (ref 1.7–7.7)
Neutrophils Relative %: 90 %
Platelets: 245 10*3/uL (ref 150–400)
RBC: 5.52 MIL/uL (ref 4.22–5.81)
RDW: 12.3 % (ref 11.5–15.5)
WBC: 12.2 10*3/uL — ABNORMAL HIGH (ref 4.0–10.5)
nRBC: 0 % (ref 0.0–0.2)

## 2019-05-18 LAB — COMPREHENSIVE METABOLIC PANEL
ALT: 78 U/L — ABNORMAL HIGH (ref 0–44)
AST: 198 U/L — ABNORMAL HIGH (ref 15–41)
Albumin: 2.7 g/dL — ABNORMAL LOW (ref 3.5–5.0)
Alkaline Phosphatase: 52 U/L (ref 38–126)
Anion gap: 15 (ref 5–15)
BUN: 33 mg/dL — ABNORMAL HIGH (ref 8–23)
CO2: 22 mmol/L (ref 22–32)
Calcium: 8.6 mg/dL — ABNORMAL LOW (ref 8.9–10.3)
Chloride: 97 mmol/L — ABNORMAL LOW (ref 98–111)
Creatinine, Ser: 0.87 mg/dL (ref 0.61–1.24)
GFR calc Af Amer: >60 mL/min (ref >60)
GFR calc non Af Amer: >60 mL/min (ref >60)
Glucose, Bld: 173 mg/dL — ABNORMAL HIGH (ref 70–99)
Potassium: 4.9 mmol/L (ref 3.5–5.1)
Sodium: 134 mmol/L — ABNORMAL LOW (ref 135–145)
Total Bilirubin: 1 mg/dL (ref 0.3–1.2)
Total Protein: 6.6 g/dL (ref 6.5–8.1)

## 2019-05-18 LAB — URINALYSIS, ROUTINE W REFLEX MICROSCOPIC
Bilirubin Urine: NEGATIVE
Glucose, UA: NEGATIVE mg/dL
Ketones, ur: 5 mg/dL — AB
Leukocytes,Ua: NEGATIVE
Nitrite: NEGATIVE
Protein, ur: 100 mg/dL — AB
Specific Gravity, Urine: 1.027 (ref 1.005–1.030)
pH: 5 (ref 5.0–8.0)

## 2019-05-18 LAB — RAPID URINE DRUG SCREEN, HOSP PERFORMED
Amphetamines: NOT DETECTED
Barbiturates: NOT DETECTED
Benzodiazepines: POSITIVE — AB
Cocaine: NOT DETECTED
Opiates: NOT DETECTED
Tetrahydrocannabinol: NOT DETECTED

## 2019-05-18 LAB — PROTIME-INR
INR: 1.1 (ref 0.8–1.2)
Prothrombin Time: 13.6 seconds (ref 11.4–15.2)

## 2019-05-18 LAB — APTT: aPTT: 38 seconds — ABNORMAL HIGH (ref 24–36)

## 2019-05-18 LAB — SARS CORONAVIRUS 2 (TAT 6-24 HRS): SARS Coronavirus 2: POSITIVE — AB

## 2019-05-18 LAB — CBG MONITORING, ED: Glucose-Capillary: 161 mg/dL — ABNORMAL HIGH (ref 70–99)

## 2019-05-18 LAB — CK: Total CK: 8929 U/L — ABNORMAL HIGH (ref 49–397)

## 2019-05-18 MED ORDER — ASPIRIN EC 81 MG PO TBEC
81.0000 mg | DELAYED_RELEASE_TABLET | Freq: Every day | ORAL | Status: DC
  Administered 2019-05-19 – 2019-05-21 (×3): 81 mg via ORAL
  Filled 2019-05-18 (×4): qty 1

## 2019-05-18 MED ORDER — LACTATED RINGERS IV BOLUS
1000.0000 mL | Freq: Once | INTRAVENOUS | Status: AC
  Administered 2019-05-18: 1000 mL via INTRAVENOUS

## 2019-05-18 MED ORDER — SODIUM CHLORIDE 0.9 % IV SOLN: INTRAVENOUS | Status: DC

## 2019-05-18 MED ORDER — LISINOPRIL 40 MG PO TABS
40.0000 mg | ORAL_TABLET | Freq: Every day | ORAL | Status: DC
  Administered 2019-05-19 – 2019-05-21 (×3): 40 mg via ORAL
  Filled 2019-05-18: qty 1
  Filled 2019-05-18: qty 2
  Filled 2019-05-18 (×2): qty 1

## 2019-05-18 MED ORDER — SODIUM CHLORIDE 0.9 % IV SOLN
3.0000 g | Freq: Four times a day (QID) | INTRAVENOUS | Status: DC
  Administered 2019-05-18 – 2019-05-20 (×7): 3 g via INTRAVENOUS
  Filled 2019-05-18 (×4): qty 8
  Filled 2019-05-18 (×2): qty 3
  Filled 2019-05-18 (×2): qty 8
  Filled 2019-05-18: qty 3

## 2019-05-18 MED ORDER — SODIUM CHLORIDE 0.9% FLUSH: 3.0000 mL | Freq: Once | INTRAVENOUS | Status: DC

## 2019-05-18 MED ORDER — LIDOCAINE 5 % EX PTCH
1.0000 | MEDICATED_PATCH | CUTANEOUS | Status: DC
  Administered 2019-05-18 – 2019-05-21 (×4): 1 via TRANSDERMAL
  Filled 2019-05-18 (×5): qty 1

## 2019-05-18 MED ORDER — ACETAMINOPHEN 650 MG RE SUPP: 650.0000 mg | Freq: Four times a day (QID) | RECTAL | Status: DC | PRN

## 2019-05-18 MED ORDER — NEBIVOLOL HCL 10 MG PO TABS
10.0000 mg | ORAL_TABLET | Freq: Every day | ORAL | Status: DC
  Administered 2019-05-19 – 2019-05-21 (×3): 10 mg via ORAL
  Filled 2019-05-18 (×4): qty 1

## 2019-05-18 MED ORDER — ACETAMINOPHEN 325 MG PO TABS
650.0000 mg | ORAL_TABLET | Freq: Four times a day (QID) | ORAL | Status: DC | PRN
  Administered 2019-05-19 (×2): 650 mg via ORAL
  Filled 2019-05-18: qty 2

## 2019-05-18 MED ORDER — ENOXAPARIN SODIUM 40 MG/0.4ML ~~LOC~~ SOLN
40.0000 mg | SUBCUTANEOUS | Status: DC
  Administered 2019-05-18: 40 mg via SUBCUTANEOUS
  Filled 2019-05-18: qty 0.4

## 2019-05-18 MED ORDER — BUPROPION HCL ER (XL) 150 MG PO TB24
300.0000 mg | ORAL_TABLET | Freq: Every day | ORAL | Status: DC
  Administered 2019-05-20: 300 mg via ORAL
  Filled 2019-05-18: qty 2

## 2019-05-18 NOTE — ED Notes (Signed)
Pt remains confused with slurred speech, will follow commands only briefly.

## 2019-05-18 NOTE — ED Notes (Signed)
Wife updated on pt status.
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
## 2019-05-18 NOTE — ED Triage Notes (Signed)
Pt has slurred speech and is oriented to person and time only.   Unable to give details of why he is here.

## 2019-05-18 NOTE — ED Notes (Signed)
Pt remains lethargic and sleeping.  IV remains patent and Abx in process.

## 2019-05-18 NOTE — ED Notes (Signed)
Large pressure wound/ abrasion noted to left hip, pressure dressing applied

## 2019-05-18 NOTE — Progress Notes (Signed)
FPTS Interim Progress Note  S: Asked to review pt's neurological status by day team. Pt was intermittently somnolent but rousable when we called his name. Slurred speech and slow to respond. ANO X 1.   O: BP (!) 150/79   Pulse 77   Temp (!) 97.4 F (36.3 C) (Oral)   Resp 17   SpO2 94%   General: Somnolent throughout exam, appears comfortable Cardio: Normal S1 and S2, RRR, No murmurs or rubs.   Pulm: CTAB, no crackles, wheezing, or diminished breath sounds. Normal respiratory effort.  Abdomen: Bowel sounds normal. Abdomen soft, tender in LLQ. No guarding. Neuro: Orientated to place. Somnolent but rousable. Able to follow commands and most questions appropriately. Slow to respond at times with slurred speech. Able to tell me he had abdominal pain on exam    A/P: Continue plan per day team Pt is now COVID positive, admit to COVID unit once bed is available  Monitor ESR and CMP  Lattie Haw, MD 05/18/2019, 9:11 PM PGY-1, [facility] pager [phone]

## 2019-05-18 NOTE — ED Notes (Signed)
To MRI

## 2019-05-18 NOTE — ED Notes (Signed)
Pt returns from MRI and wife called and updated on POC and lab results.

## 2019-05-18 NOTE — ED Triage Notes (Signed)
Pt from home by EMS.  Reports of weakness and back pain, found laying on the porch by friend.  Has multiple wounds noted by EMS and is on abx, slurred speech and reportedly seen yesterday.  Pt moved to stretcher and is snoring quickly to sleep.
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
## 2019-05-18 NOTE — Progress Notes (Signed)
Med history completed over the phone with his wife. She is unsure about some of the details. 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 Let pharmacy know if we need to interview him when mental status improves.   Romeo Rabon, PharmD Admin. Coordinator of Transitions of Care 05/18/2019,3:38 PM

## 2019-05-19 ENCOUNTER — Inpatient Hospital Stay (HOSPITAL_COMMUNITY): Payer: Medicare HMO

## 2019-05-19 DIAGNOSIS — M6282 Rhabdomyolysis: Secondary | ICD-10-CM | POA: Diagnosis present

## 2019-05-19 DIAGNOSIS — J1282 Pneumonia due to coronavirus disease 2019: Secondary | ICD-10-CM | POA: Diagnosis present

## 2019-05-19 DIAGNOSIS — I10 Essential (primary) hypertension: Secondary | ICD-10-CM | POA: Diagnosis present

## 2019-05-19 DIAGNOSIS — Z87442 Personal history of urinary calculi: Secondary | ICD-10-CM | POA: Diagnosis not present

## 2019-05-19 DIAGNOSIS — F418 Other specified anxiety disorders: Secondary | ICD-10-CM | POA: Diagnosis present

## 2019-05-19 DIAGNOSIS — M5134 Other intervertebral disc degeneration, thoracic region: Secondary | ICD-10-CM | POA: Diagnosis present

## 2019-05-19 DIAGNOSIS — W19XXXA Unspecified fall, initial encounter: Secondary | ICD-10-CM | POA: Diagnosis present

## 2019-05-19 DIAGNOSIS — R4781 Slurred speech: Secondary | ICD-10-CM

## 2019-05-19 DIAGNOSIS — M5136 Other intervertebral disc degeneration, lumbar region: Secondary | ICD-10-CM | POA: Diagnosis present

## 2019-05-19 DIAGNOSIS — F4310 Post-traumatic stress disorder, unspecified: Secondary | ICD-10-CM | POA: Diagnosis present

## 2019-05-19 DIAGNOSIS — S7002XA Contusion of left hip, initial encounter: Secondary | ICD-10-CM | POA: Diagnosis present

## 2019-05-19 DIAGNOSIS — N400 Enlarged prostate without lower urinary tract symptoms: Secondary | ICD-10-CM | POA: Diagnosis present

## 2019-05-19 DIAGNOSIS — N390 Urinary tract infection, site not specified: Secondary | ICD-10-CM | POA: Diagnosis present

## 2019-05-19 DIAGNOSIS — G4700 Insomnia, unspecified: Secondary | ICD-10-CM | POA: Diagnosis present

## 2019-05-19 DIAGNOSIS — G92 Toxic encephalopathy: Secondary | ICD-10-CM | POA: Diagnosis present

## 2019-05-19 DIAGNOSIS — F05 Delirium due to known physiological condition: Secondary | ICD-10-CM | POA: Diagnosis present

## 2019-05-19 DIAGNOSIS — R531 Weakness: Secondary | ICD-10-CM | POA: Diagnosis not present

## 2019-05-19 DIAGNOSIS — Z5329 Procedure and treatment not carried out because of patient's decision for other reasons: Secondary | ICD-10-CM | POA: Diagnosis not present

## 2019-05-19 DIAGNOSIS — E7800 Pure hypercholesterolemia, unspecified: Secondary | ICD-10-CM | POA: Diagnosis present

## 2019-05-19 DIAGNOSIS — G47419 Narcolepsy without cataplexy: Secondary | ICD-10-CM | POA: Diagnosis present

## 2019-05-19 DIAGNOSIS — F329 Major depressive disorder, single episode, unspecified: Secondary | ICD-10-CM | POA: Diagnosis present

## 2019-05-19 DIAGNOSIS — K760 Fatty (change of) liver, not elsewhere classified: Secondary | ICD-10-CM | POA: Diagnosis present

## 2019-05-19 DIAGNOSIS — B952 Enterococcus as the cause of diseases classified elsewhere: Secondary | ICD-10-CM | POA: Diagnosis present

## 2019-05-19 DIAGNOSIS — U071 COVID-19: Secondary | ICD-10-CM | POA: Diagnosis present

## 2019-05-19 DIAGNOSIS — I2510 Atherosclerotic heart disease of native coronary artery without angina pectoris: Secondary | ICD-10-CM | POA: Diagnosis present

## 2019-05-19 DIAGNOSIS — E785 Hyperlipidemia, unspecified: Secondary | ICD-10-CM | POA: Diagnosis present

## 2019-05-19 DIAGNOSIS — R748 Abnormal levels of other serum enzymes: Secondary | ICD-10-CM | POA: Diagnosis not present

## 2019-05-19 DIAGNOSIS — S301XXA Contusion of abdominal wall, initial encounter: Secondary | ICD-10-CM | POA: Diagnosis present

## 2019-05-19 DIAGNOSIS — R4182 Altered mental status, unspecified: Secondary | ICD-10-CM | POA: Diagnosis not present

## 2019-05-19 LAB — CBC
HCT: 49 % (ref 39.0–52.0)
Hemoglobin: 16.7 g/dL (ref 13.0–17.0)
MCH: 32 pg (ref 26.0–34.0)
MCHC: 34.1 g/dL (ref 30.0–36.0)
MCV: 93.9 fL (ref 80.0–100.0)
Platelets: 298 10*3/uL (ref 150–400)
RBC: 5.22 MIL/uL (ref 4.22–5.81)
RDW: 12.3 % (ref 11.5–15.5)
WBC: 10.6 10*3/uL — ABNORMAL HIGH (ref 4.0–10.5)
nRBC: 0 % (ref 0.0–0.2)

## 2019-05-19 LAB — C-REACTIVE PROTEIN: CRP: 21 mg/dL — ABNORMAL HIGH (ref <1.0)

## 2019-05-19 LAB — LACTATE DEHYDROGENASE: LDH: 413 U/L — ABNORMAL HIGH (ref 98–192)

## 2019-05-19 LAB — COMPREHENSIVE METABOLIC PANEL
ALT: 65 U/L — ABNORMAL HIGH (ref 0–44)
AST: 129 U/L — ABNORMAL HIGH (ref 15–41)
Albumin: 2.4 g/dL — ABNORMAL LOW (ref 3.5–5.0)
Alkaline Phosphatase: 44 U/L (ref 38–126)
Anion gap: 12 (ref 5–15)
BUN: 38 mg/dL — ABNORMAL HIGH (ref 8–23)
CO2: 24 mmol/L (ref 22–32)
Calcium: 8.3 mg/dL — ABNORMAL LOW (ref 8.9–10.3)
Chloride: 100 mmol/L (ref 98–111)
Creatinine, Ser: 0.74 mg/dL (ref 0.61–1.24)
Glucose, Bld: 142 mg/dL — ABNORMAL HIGH (ref 70–99)
Potassium: 4.5 mmol/L (ref 3.5–5.1)
Sodium: 136 mmol/L (ref 135–145)
Total Bilirubin: 0.8 mg/dL (ref 0.3–1.2)
Total Protein: 6.1 g/dL — ABNORMAL LOW (ref 6.5–8.1)

## 2019-05-19 LAB — ABO/RH: ABO/RH(D): A POS

## 2019-05-19 LAB — TROPONIN I (HIGH SENSITIVITY): Troponin I (High Sensitivity): 12 ng/L (ref <18)

## 2019-05-19 LAB — CK: Total CK: 3858 U/L — ABNORMAL HIGH (ref 49–397)

## 2019-05-19 LAB — FERRITIN: Ferritin: 2311 ng/mL — ABNORMAL HIGH (ref 24–336)

## 2019-05-19 LAB — D-DIMER, QUANTITATIVE: D-Dimer, Quant: 7.1 ug/mL-FEU — ABNORMAL HIGH (ref 0.00–0.50)

## 2019-05-19 MED ORDER — DEXAMETHASONE SODIUM PHOSPHATE 10 MG/ML IJ SOLN
6.0000 mg | Freq: Every day | INTRAMUSCULAR | Status: DC
  Administered 2019-05-19: 6 mg via INTRAVENOUS
  Filled 2019-05-19: qty 1

## 2019-05-19 MED ORDER — TRAMADOL HCL 50 MG PO TABS
50.0000 mg | ORAL_TABLET | Freq: Once | ORAL | Status: AC
  Administered 2019-05-19: 50 mg via ORAL
  Filled 2019-05-19: qty 1

## 2019-05-19 MED ORDER — ENOXAPARIN SODIUM 40 MG/0.4ML ~~LOC~~ SOLN
40.0000 mg | Freq: Two times a day (BID) | SUBCUTANEOUS | Status: DC
  Administered 2019-05-19 – 2019-05-21 (×5): 40 mg via SUBCUTANEOUS
  Filled 2019-05-19 (×4): qty 0.4

## 2019-05-19 MED ORDER — MORPHINE SULFATE (PF) 4 MG/ML IV SOLN: 4.0000 mg | Freq: Once | INTRAVENOUS | Status: DC

## 2019-05-19 MED ORDER — TRAMADOL HCL 50 MG PO TABS: 50.0000 mg | ORAL_TABLET | Freq: Two times a day (BID) | ORAL | Status: DC | PRN

## 2019-05-19 MED ORDER — SODIUM CHLORIDE 0.9 % IV SOLN
200.0000 mg | Freq: Once | INTRAVENOUS | Status: AC
  Administered 2019-05-19: 200 mg via INTRAVENOUS
  Filled 2019-05-19: qty 200

## 2019-05-19 MED ORDER — SODIUM CHLORIDE 0.9 % IV SOLN
100.0000 mg | Freq: Every day | INTRAVENOUS | Status: DC
  Filled 2019-05-19: qty 20

## 2019-05-19 MED ORDER — ACETAMINOPHEN 500 MG PO TABS
500.0000 mg | ORAL_TABLET | Freq: Four times a day (QID) | ORAL | Status: DC
  Administered 2019-05-19 – 2019-05-21 (×7): 500 mg via ORAL
  Filled 2019-05-19 (×7): qty 1

## 2019-05-19 NOTE — ED Notes (Signed)
Pt attempted to use bed pan. Unsucessful

## 2019-05-19 NOTE — ED Notes (Signed)
Pt asleep, resting comfortably at this time, will continue to reevaluate pain.

## 2019-05-19 NOTE — Progress Notes (Signed)
Patient complaining of new onset bilateral lower quadrant abdominal pain, severe back pain.  Came in altered, questionable historian with positive UDS with benzodiazepines. Elevated CK and new bruise on left hip continues to raise suspicion for recent fall.  Will repeat CT abdomen pelvis to evaluate for fracture, intra-abdominal infection, also with UTI and stone on recent imaging, concern for possible complicated renal infection.

## 2019-05-19 NOTE — ED Notes (Signed)
Paged resident, spoke with the resident about pt concerns and speaking with pt's wife. Informed them that the wife really wants to speak with a doctor.

## 2019-05-19 NOTE — ED Notes (Signed)
FM paged to wife(Deborah) of patient to call her back.  Paged @ [phone]-wife have questions.

## 2019-05-19 NOTE — ED Notes (Signed)
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 IVPB (0 g Intravenous Stopped 05/19/19 2200)  enoxaparin (LOVENOX) injection 40 mg (40 mg Subcutaneous Given 05/19/19 1230)  remdesivir 200 mg in sodium chloride 0.9% 250 mL IVPB (0 mg Intravenous Stopped 05/19/19 1446)    Followed by  remdesivir 100 mg in sodium chloride 0.9 % 100 mL IVPB (has no administration in time range)  dexamethasone (DECADRON) injection 6 mg (6 mg Intravenous Given 05/19/19 1226)  acetaminophen (TYLENOL) tablet 500 mg (has no administration in time range)  lactated ringers bolus 1,000 mL (0 mLs Intravenous Stopped 05/18/19 1756)  traMADol (ULTRAM) tablet 50 mg (50 mg Oral Given 05/19/19 1801)    Mobility walks     Focused Assessments Cardiac Assessment Handoff:    Lab Results   Component Value Date   CKTOTAL 3,858 (H) 05/19/2019   TROPONINI 18.26 (HH) 10/12/2014   Lab Results  Component Value Date   DDIMER 7.10 (H) 05/19/2019   Does the Patient currently have chest pain? No  , Neuro Assessment Handoff:  Swallow screen pass? Yes    NIH Stroke Scale ( + Modified Stroke Scale Criteria)  LOC Questions (1b. )   +: Answers both questions correctly LOC Commands (1c. )   + : Performs both tasks correctly Best Gaze (2. )  +: Normal Visual (3. )  +: No visual loss Motor Arm, Left (5a. )   +: No drift Motor Arm, Right (5b. )   +: No drift Motor Leg, Left (6a. )   +: No drift Motor Leg, Right (6b. )   +: No drift Sensory (8. )   +: Normal, no sensory loss Best Language (9. )   +: Severe aphasia Extinction/Inattention (11.)   +: Visual/tactile/auditory/spatial/personal inattention Modified SS Total  +: 3     Neuro Assessment: Exceptions to WDL Neuro Checks:      Last Documented NIHSS Modified Score: 3 (05/19/19 [id number]) Has TPA been given? No If patient is a Neuro Trauma and patient is going to OR before floor call report  to 4N Charge nurse: [phone] or [phone]  , Pulmonary Assessment Handoff:  Lung sounds:   O2 Device: Room Air O2 Flow Rate (L/min): 3 L/min      R Recommendations: See Admitting Provider Note  Report given to:   Additional Notes:

## 2019-05-19 NOTE — ED Notes (Signed)
Spoke with Pt's wife she is very upset and requesting to speak to a doctor.

## 2019-05-19 NOTE — ED Notes (Signed)
Pt is more alert and oriented at this time. Pt agitated, expressed frustrations with pain management. Pt initially refused Tylenol, advised he would sign out AMA and "go to a real doctor". Pt educated on pain control methodologies. Pt changed mind and wanted the Tylenol. Medication administered, admitting to reassess pt in AM for further pain management per Dr. Posey Pronto.

## 2019-05-19 NOTE — ED Notes (Signed)
Spoke with resident and it is ok to give Tramadol.

## 2019-05-19 NOTE — Progress Notes (Addendum)
Family Medicine Teaching Service Daily Progress Note Intern Pager: [phone]  Patient name: Ivan [name] Medical record number: [id number] Date of birth: 10-Mar-1946 Age: 74 y.o. Gender: male  Primary Care Provider: Sherald Hess., MD Consultants: None Code Status: Full  Pt Overview and Major Events to Date:  1/5-admitted  Assessment and Plan:  Toxic metabolic delirium, multifactorial in the setting of polypharmacy and COVID-19 infection Patient does endorse taking Xanax twice per day for the past few weeks.  Patient now found to be COVID-19 positive.  D-dimer elevated at 7.10.  LDH elevated at 413.The cause of his altered mental status likely multifactorial.  Patient with polypharmacy including Xanax, tramadol, gabapentin as well as delirium in the setting of infection.  Patient does have recent history of UTI which may have not been well treated as well as his current COVID-19 infection. Patient also apparently had a UTI recently that grew greater than 100,000 colonies of Enterococcus faecalis on 1/1 for which the patient was given Keflex, this may have not provided good coverage.  Patient's white blood cell count on presentation was elevated at 12.2.  Patient also continues to endorse dysuria and urinary frequency which further supports UTI.  Current urinalysis shows moderate hemoglobin, negative leukocyte esterase negative nitrite with many bacteria present. -Continuous cardiac monitoring, continuous pulse ox -Follow repeat urine cultures -IV fluids at 150 mL/h -Monitor CMP and CBC -Monitor patient's CK -We will treat urine with Unasyn per pharmacy -Monitor respiratory status for signs of pneumonia -Delirium precautions -Airborne/contact precautions as patient is COVID-19 positive. -CIWA's -Post rounds addendum: Patient on Lovenox 40 twice daily due to elevated D-dimer in setting of COVID-19 infection.  Rhabdomyolysis Improving, most recent CK 3858.  Initial CK of 8929 with  associated severe myalgias.  Renal function appears stable with creatinine of 0.74.  Patient with initial elevated AST which is also improving and is likely due to musculoskeletal damage.  Differential can include medication, viral infection such as influenza/Covid, trauma, dermatomyositis/polymyositis, severe dehydration, immobility, seizures, or strenuous exercise.  Per patient's wife patient was lying on his side overnight prior to admission on the floor on top of a comforter but would not expect this to cause such a dramatic increase in CK.  Patient is found to be COVID-19 positive which could explain his rhabdomyolysis. -Fluids at 150 cc/hour -Continue to monitor renal function and CK -Hold patient's home statin -Continue to monitor  UTI WBC improving to 10.6.  Several day history of dysuria with urine culture showing greater than 100,000 colonies of E faecalis.  Patient initially treated with Keflex prior to return of culture, would not cover Enterococcus.    Initial white blood cell count elevated 12.2 on admission. -Unasyn as above -Repeat urine cultures  Abnormal chest x-ray: Acute. Prominent infiltrate noted in LUL with additional mild right base atelectasis/infiltrate noted on CXR.  Reassuringly he does not endorse any respiratory symptomatology with unlabored breathing on exam, however he is currently a poor historian.  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  Could also consider pulmonary contusion presenting in this fashion, however without known history of trauma this is less likely. Currently on Unasyn for UTI, will likely cover for potential CAP as well.  Will monitor for now as awaiting Covid result.  Left hip contusion Patient denies knowledge of any cause of his left hip contusion.  Patient also has x-ray findings that can  be suggestive of left lung contusion. -Post  rounds addendum: We will get CT abdomen pelvis due to new onset of severe abdominal pain (see Dr. Biagio Borg progress note) which will provide good visualization of patient's left hip.  We will also get CT chest at this time due to concern for patient having a fall prior to being admitted. -Continue to monitor patient's CK as noted above  CAD, history of MI Patient with history of STEMI in 2016 per chart review.  Patient also appears to have had myocardial infarction in 2012 per chart review.  Home medications include lisinopril, Nebivolol, aspirin 81 mg.  Patient also takes Crestor -Continue home medications. -Hold patient home Crestor -We will also check troponin on patient  History of stroke Per chart review patient appears to be on Plavix for this.  Plavix does not appear to be on patient's outpatient medication list and patient's wife is unsure of some of his meds. -We will clarify with patient as his mental status improves about whether he is on Plavix currently.  Back pain/degenerative disc disease X-ray of lumbar spine on presentation shows no acute findings with moderate to severe multilevel disc degenerative disease.  X-ray of thoracic spine shows no acute osseous injury with prior anterior cervical fusion partially visualized from C4-C7. -Provide K pad and a lidocaine patch, and acetaminophen for pain control -Recommend outpatient follow-up  Hypertension Normotensive on admission.  Home medications include lisinopril.   -Continue home medications  Hyperlipidemia Home medications include rosuvastatin -Hold patient's home rosuvastatin due to elevated LFTs/rhabdomyolysis  Depression/anxiety Home medications include Wellbutrin.  Patient also has home medication for Xanax, hydroxyzine, Restoril. -Continue patient's Wellbutrin.  Hold patient's Xanax, hydroxyzine, Restoril due to mental status.  BPH Home medications include finasteride -Hold patient's finasteride    FEN/GI: NPO PPx: Lovenox 40 twice daily  Disposition: Pending medical work-up  Subjective:  I was able to obtain more history from the patient today.  He states he was taking Xanax twice per day for the past few days.  He denies taking Restoril recently.  States he does not think he took Toradol prior to coming in to the hospital.  He states he does drink alcohol but only about 1 drink every other day.  He denies knowledge of anything that caused the large left hip abrasion, or potential causes of his x-ray findings which are suggestive of a left lung contusion.  Objective: Temp:  [97.4 F (36.3 C)-97.8 F (36.6 C)] 97.4 F (36.3 C) (01/05 1903) Pulse Rate:  [53-94] 94 (01/06 0509) Resp:  [14-92] 17 (01/06 0509) BP: (114-150)/(70-88) 124/75 (01/06 0509) SpO2:  [89 %-97 %] 93 % (01/06 0509) Physical Exam: General: Somnolent but arousable, some slurring of speech similar to yesterday. Heart: Regular rate and rhythm with no murmurs appreciated Lungs: CTA bilaterally Abdomen: Bowel sounds present, some abdominal discomfort to palpation diffusely, no peritoneal signs Skin/MSK: Warm and dry, large left hip contusion with some superficial skin breakdown.  Unable to evaluate hip motion due to patient's back pain.   Laboratory: Recent Labs  Lab 05/14/19 0445 05/18/19 1159 05/19/19 0521  WBC 5.8 12.2* 10.6*  HGB 15.1 17.9* 16.7  HCT 44.7 53.5* 49.0  PLT 150 245 298   Recent Labs  Lab 05/14/19 0445 05/18/19 1159 05/19/19 0521  NA 133* 134* 136  K 4.0 4.9 4.5  CL 98 97* 100  CO2 21* 22 24  BUN 18 33* 38*  CREATININE 0.80 0.87 0.74  CALCIUM 8.5* 8.6* 8.3*  PROT  --  6.6 6.1*  BILITOT  --  1.0 0.8  ALKPHOS  --  52 44  ALT  --  78* 65*  AST  --  198* 129*  GLUCOSE 127* 173* 142*      Imaging/Diagnostic Tests:   Lurline Del, DO 05/19/2019, 6:30 AM PGY-1, [place] Intern pager: [phone], text pages welcome

## 2019-05-19 NOTE — ED Notes (Signed)
Pt pulled out IV. Bleeding controlled at this time.

## 2019-05-19 NOTE — Progress Notes (Signed)
Received a page that patient's wife was upset and requested a call from the doctor.  Patient's wife was upset that her husband seem to be in a lot of pain in the last time she spoke with him on the phone.  She was very curious as to why he was not getting any pain medications for his condition and wanted to know what was going on.  I informed the wife that the patient had had a waxing and waning mental status.  He also had some apparent respiratory distress in the setting of known Covid pneumonia.  He also had a UDS positive for benzodiazepines. We were hesitant to give any narcotics earlier today due to the altered mental status and concern for respiratory depression.  We have trialed Tylenol.  Wife was not satisfied with this and said that this was not working.  Nursing reports that patient had been somnolent through the day and somewhat agitated.  I went to go evaluate the patient.  He was incontinent.  But he was alert and oriented x3 though.  He was not somnolent.  He was breathing comfortably on room air and pulse ox was 95+ percent. His speech did seem better than it was earlier in the day.  He did endorse some lower back pain pain, but overall his pain was better than earlier in the day when he had had significant abdominal pain.  His CT abdominal pelvis scan was remarkable for an abdominal wall hematoma secondary to trauma likely from a fall but patient had no acute intra-abdominal processes or fracture requiring urgent attention.  We unfortunately cannot give NSAIDs as patient has an elevated D-dimer requiring additional anticoagulation secondary to COVID-19. Spoke with attending Dr. Gwendlyn Deutscher regarding patient status.  She is okay with scheduling Tylenol and trialing a dose of low-dose tramadol to see if this improves patient's pain without causing respiratory issues.  If this does not work, we could try low-dose IV morphine if patient cannot tolerate p.o.  I updated his wife with our plan, and she was  appreciative.  She endorsed frustration and generally being upset at the situation because her husband is usually a very active person and he recently received the COVID-19 vaccine.  I told the patient that I understand this is a very frustrating moment and that this must be difficult.  She did appreciate the call and said that she felt more relieved after our discussion. I told her that we will be watching her husband closely and that we would inform her with any changes.

## 2019-05-19 NOTE — ED Notes (Signed)
Lunch Ordered @ 1346.  

## 2019-05-19 NOTE — ED Notes (Signed)
Pt is awake, c/o return of severe back pain. MD aware. Pt remains agitated and hostile in vocalization towards staff.

## 2019-05-19 NOTE — ED Notes (Signed)
Pt becoming increasingly hostile towards staff, advises "he will be leaving this ([facility] hospital as soon as possible, I am fed up". I asked patient if he wanted to speak with provider, he advised "would not make a difference".

## 2019-05-20 ENCOUNTER — Other Ambulatory Visit: Payer: Self-pay

## 2019-05-20 DIAGNOSIS — G92 Toxic encephalopathy: Secondary | ICD-10-CM

## 2019-05-20 DIAGNOSIS — R4781 Slurred speech: Secondary | ICD-10-CM

## 2019-05-20 DIAGNOSIS — R531 Weakness: Secondary | ICD-10-CM

## 2019-05-20 DIAGNOSIS — R4182 Altered mental status, unspecified: Secondary | ICD-10-CM

## 2019-05-20 DIAGNOSIS — U071 COVID-19: Principal | ICD-10-CM

## 2019-05-20 DIAGNOSIS — G9341 Metabolic encephalopathy: Secondary | ICD-10-CM

## 2019-05-20 DIAGNOSIS — R748 Abnormal levels of other serum enzymes: Secondary | ICD-10-CM

## 2019-05-20 LAB — CBC
HCT: 44.6 % (ref 39.0–52.0)
Hemoglobin: 15 g/dL (ref 13.0–17.0)
MCH: 31.6 pg (ref 26.0–34.0)
MCHC: 33.6 g/dL (ref 30.0–36.0)
MCV: 94.1 fL (ref 80.0–100.0)
Platelets: 304 10*3/uL (ref 150–400)
RBC: 4.74 MIL/uL (ref 4.22–5.81)
RDW: 12.4 % (ref 11.5–15.5)
WBC: 9.5 10*3/uL (ref 4.0–10.5)
nRBC: 0 % (ref 0.0–0.2)

## 2019-05-20 LAB — COMPREHENSIVE METABOLIC PANEL
ALT: 60 U/L — ABNORMAL HIGH (ref 0–44)
AST: 95 U/L — ABNORMAL HIGH (ref 15–41)
Albumin: 2.2 g/dL — ABNORMAL LOW (ref 3.5–5.0)
Alkaline Phosphatase: 38 U/L (ref 38–126)
Anion gap: 6 (ref 5–15)
BUN: 30 mg/dL — ABNORMAL HIGH (ref 8–23)
CO2: 28 mmol/L (ref 22–32)
Calcium: 8.1 mg/dL — ABNORMAL LOW (ref 8.9–10.3)
Chloride: 105 mmol/L (ref 98–111)
Creatinine, Ser: 0.61 mg/dL (ref 0.61–1.24)
GFR calc Af Amer: >60 mL/min (ref >60)
GFR calc non Af Amer: >60 mL/min (ref >60)
Glucose, Bld: 135 mg/dL — ABNORMAL HIGH (ref 70–99)
Potassium: 4.8 mmol/L (ref 3.5–5.1)
Sodium: 139 mmol/L (ref 135–145)
Total Bilirubin: 0.9 mg/dL (ref 0.3–1.2)
Total Protein: 5.2 g/dL — ABNORMAL LOW (ref 6.5–8.1)

## 2019-05-20 LAB — FERRITIN: Ferritin: 1626 ng/mL — ABNORMAL HIGH (ref 24–336)

## 2019-05-20 LAB — URINE CULTURE: Culture: >=100000 — AB

## 2019-05-20 LAB — D-DIMER, QUANTITATIVE: D-Dimer, Quant: 4.63 ug/mL-FEU — ABNORMAL HIGH (ref 0.00–0.50)

## 2019-05-20 LAB — LACTATE DEHYDROGENASE: LDH: 385 U/L — ABNORMAL HIGH (ref 98–192)

## 2019-05-20 LAB — HEMOGLOBIN A1C
Hgb A1c MFr Bld: 6.1 % — ABNORMAL HIGH (ref 4.8–5.6)
Mean Plasma Glucose: 128.37 mg/dL

## 2019-05-20 LAB — MRSA PCR SCREENING: MRSA by PCR: NEGATIVE

## 2019-05-20 LAB — CK: Total CK: 2714 U/L — ABNORMAL HIGH (ref 49–397)

## 2019-05-20 LAB — C-REACTIVE PROTEIN: CRP: 10.9 mg/dL — ABNORMAL HIGH (ref <1.0)

## 2019-05-20 MED ORDER — SODIUM CHLORIDE 0.9 % IV SOLN
100.0000 mg | Freq: Every day | INTRAVENOUS | Status: DC
  Administered 2019-05-20 – 2019-05-21 (×2): 100 mg via INTRAVENOUS
  Filled 2019-05-20 (×2): qty 20

## 2019-05-20 MED ORDER — FINASTERIDE 5 MG PO TABS
5.0000 mg | ORAL_TABLET | Freq: Every day | ORAL | Status: DC
  Administered 2019-05-20 – 2019-05-21 (×2): 5 mg via ORAL
  Filled 2019-05-20 (×3): qty 1

## 2019-05-20 MED ORDER — NITROFURANTOIN MONOHYD MACRO 100 MG PO CAPS
100.0000 mg | ORAL_CAPSULE | Freq: Two times a day (BID) | ORAL | Status: DC
  Administered 2019-05-20 – 2019-05-21 (×4): 100 mg via ORAL
  Filled 2019-05-20 (×6): qty 1

## 2019-05-20 MED ORDER — TRAMADOL HCL 50 MG PO TABS
50.0000 mg | ORAL_TABLET | Freq: Four times a day (QID) | ORAL | Status: AC | PRN
  Administered 2019-05-20 – 2019-05-21 (×6): 50 mg via ORAL
  Filled 2019-05-20 (×7): qty 1
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
## 2019-05-20 NOTE — Progress Notes (Signed)
Spoke with wife multiple times today to update her on her husband.

## 2019-05-20 NOTE — Progress Notes (Signed)
Family Medicine Teaching Service Daily Progress Note Intern Pager: [phone]  Patient name: Ivan [name] Medical record number: [id number] Date of birth: 01-11-1946 Age: 74 y.o. Gender: male  Primary Care Provider: Sherald Hess., MD Consultants: None Code Status: Full  Pt Overview and Major Events to Date:  1/5-admitted  Assessment and Plan:  Toxic metabolic delirium, multifactorial in the setting of polypharmacy and COVID-19 infection Patient's mental status seems to be slowly improving, though he is alert and oriented only to person and time.  Patient denies any known trauma.  Due to a desat to yesterday into the upper 80s patient was placed on oxygen.  Because of this patient was started on dexamethasone and remdesivir as he is COVID-19 positive.  Patient is currently breathing well on room air.  Patient did endorse taking Xanax twice per day for the past few weeks.  Patient is overall inflammatory labs are reassuring with improvements in LDH, ferritin, CRP, and D-dimer.  D-dimer initially elevated at 7.10, currently 4.63.  LDH initially elevated at 413, currently 385..The cause of his altered mental status likely multifactorial.  Patient with polypharmacy including Xanax, tramadol, gabapentin as well as delirium in the setting of infection.  Patient does have recent history of UTI which may have not been well treated as well as his current COVID-19 infection. Patient also apparently had a UTI recently that grew greater than 100,000 colonies of Enterococcus faecalis on 1/1 for which the patient was given Keflex, this may have not provided good coverage.  Patient's white blood cell count on presentation was elevated at 12.2, currently within normal limits of 9.5. -Continuous cardiac monitoring, continuous pulse ox -Follow repeat urine cultures -IV fluids at 155mL/h -Discontinue dexamethasone  -Continue remdesivir (day 2 of 5) -Monitor CMP and CBC -Monitor patient's CK -We will  treat urine with nitrofurantoin 100 mg twice daily. -Monitor respiratory status for signs of pneumonia -Delirium precautions -Airborne/contact precautions as patient is COVID-19 positive. -CIWA's -Patient on Lovenox 40 twice daily due to elevated D-dimer in setting of COVID-19 infection.  Per pharmacy we will consider reducing this pending continued improvement of patient's D-dimer.  COVID-19 Viral pneumonia Chest CT performed yesterday did show signs compatible with multifocal viral pneumonia.  Patient is COVID-19 positive.  Chest CT shows bilateral pulmonary changes controlled with multifocal atypical viral infection which is consistent with his COVID-19 infection.  There was a period yesterday where patient required oxygen via nasal cannula due to some desats.  This prompted the initiation of Decadron and remdesivir.  Patient currently breathing well on room air. -Continue to monitor patient's respiratory status -Continue to follow inflammatory lab markers as noted above -Continue airborne/contact precautions as patient is COVID-19 positive  Rhabdomyolysis Improving, most recent CK 2714.  Initial CK of 8929 with associated severe myalgias.  Renal function appears stable with creatinine of 0.61.  Patient with initial elevated AST which is also improving and is likely due to musculoskeletal damage.  Differential can include medication, viral infection such as influenza/Covid, trauma, dermatomyositis/polymyositis, severe dehydration, immobility, seizures, or strenuous exercise.  Per patient's wife patient was lying on his side overnight prior to admission on the floor on top of a comforter but would not expect this to cause such a dramatic increase in CK.  Patient is found to be COVID-19 positive which could explain his rhabdomyolysis. -Fluids at 100cc/hour -Continue to monitor renal function and CK -Hold patient's home statin -Continue to monitor  UTI White blood cell count now within normal  limits at 9.5.  Was initially 12.2 on admission.  Several day history of dysuria with urine culture showing greater than 100,000 colonies of E faecalis.  Patient initially treated with Keflex prior to return of culture, would not cover Enterococcus.     -We will transition to nitrofurantoin 100 mg twice daily -Repeat urine cultures  Abdominal pain/left hip contusion Due to complaint of new onset abdominal pain yesterday abdominal CT was ordered.   This showed signs of a lateral abdominal wall hematoma which could be posttraumatic.   There was no sign of fracture to the patient's left hip. A chest CT which was also performed and showed changes compatible with viral pneumonia.  Patient's previous x-ray also shows findings suggestive of left lung contusion.  Patient denies any knowledge of following traumatic incident and upon speaking with his wife she also denies any knowledge of any fall or trauma.  CAD, history of MI Patient with history of STEMI in 2016 per chart review.  Patient also appears to have had myocardial infarction in 2012 per chart review.  Home medications include lisinopril, Nebivolol, aspirin 81 mg.  Patient also takes Crestor -Continue home medications. -Hold patient home Crestor -We will also check troponin on patient  History of stroke Per chart review patient appears to be on Plavix for this.  Plavix does not appear to be on patient's outpatient medication list and patient's wife is unsure of some of his meds. -We will clarify with patient as his mental status improves about whether he is on Plavix currently.  Back pain/degenerative disc disease X-ray of lumbar spine on presentation shows no acute findings with moderate to severe multilevel disc degenerative disease.  X-ray of thoracic spine shows no acute osseous injury with prior anterior cervical fusion partially visualized from C4-C7. -Provide K pad and a lidocaine patch, and acetaminophen for pain control -Recommend  outpatient follow-up -Tramadol every 6 hours as needed  Hypertension Normotensive on admission.  Home medications include lisinopril.   -Continue home medications  Hyperlipidemia Home medications include rosuvastatin -Hold patient's home rosuvastatin due to elevated LFTs/rhabdomyolysis  Depression/anxiety Home medications include Wellbutrin.  Patient also has home medication for Xanax, hydroxyzine, Restoril. -Hold patient's Xanax, hydroxyzine, Restoril due to mental status. -We will hold Wellbutrin  BPH Home medications include finasteride -Restart patient's finasteride  FEN/GI: NPO, will advance pending swallow study results. PPx: Lovenox 40 twice daily  Disposition: Pending medical work-up  Subjective:  Patient mental status continues to slowly improve.  Is alert and oriented only to person and time right now which seems to wax and wane, but was able to converse 8 more today compared to yesterday.  Patient continues to appear drowsy on exam.  He denies any known trauma to explain his abdominal hematoma and hip bruise.  His main complaint today is pain in the upper aspect of his legs that he says feels like a bruise.  He denies any calf pain.  Patient breathing well on room air.  He understands were somewhat limited in the pain medications we are able to give him due to some of his medical conditions.  He states his main complaint today is just that upper leg pain.  Objective: Temp:  [97.9 F (36.6 C)-98.2 F (36.8 C)] 98.2 F (36.8 C) (01/07 0557) Pulse Rate:  [60-97] 97 (01/06 2349) Resp:  [12-22] 14 (01/07 0800) BP: (114-139)/(65-84) 128/65 (01/07 0800) SpO2:  [90 %-96 %] 92 % (01/07 0800) Weight:  [94.9 kg] 94.9 kg (01/07 0557) Physical Exam:  General: Alert and oriented in no apparent distress Heart: S1, S2 with no murmurs appreciated Lungs: CTA Abdomen: Bowel sounds present, some abdominal discomfort to palpation, more located on the left side, compatible with  the location of patient's hematoma. Extremities: No lower extremity edema, no calf swelling, no calf pain on palpation.  Some pain on palpation in the bulk of the hamstring musculature bilaterally.  Laboratory: Recent Labs  Lab 05/18/19 1159 05/19/19 0521 05/20/19 0345  WBC 12.2* 10.6* 9.5  HGB 17.9* 16.7 15.0  HCT 53.5* 49.0 44.6  PLT 245 298 304   Recent Labs  Lab 05/18/19 1159 05/19/19 0521 05/20/19 0345  NA 134* 136 139  K 4.9 4.5 4.8  CL 97* 100 105  CO2 22 24 28   BUN 33* 38* 30*  CREATININE 0.87 0.74 0.61  CALCIUM 8.6* 8.3* 8.1*  PROT 6.6 6.1* 5.2*  BILITOT 1.0 0.8 0.9  ALKPHOS 52 44 38  ALT 78* 65* 60*  AST 198* 129* 95*  GLUCOSE 173* 142* 135*      Imaging/Diagnostic Tests:   Lurline Del, DO 05/20/2019, 1:18 PM PGY-1, [place] Intern pager: [phone], text pages welcome

## 2019-05-20 NOTE — Progress Notes (Signed)
MD paged about patient having such sever back pain. He will put in orders for some type of pain medication.

## 2019-05-20 NOTE — Discharge Summary (Addendum)
[facility] Discharge Summary  Patient name: Ivan [name] Medical record number: [id number] Date of birth: 1946/01/06 Age: 74 y.o. Gender: male Date of Admission: 05/18/2019  Date of Discharge: Patient left AMA 05/22/2019 Admitting Physician: Martyn Malay, MD  Primary Care Provider: Sherald Hess., MD Consultants: None  Indication for Hospitalization: Altered mental status, toxic metabolic delirium multifactorial.  Discharge Diagnoses/Problem List:  Active Problems:   AMS (altered mental status)   [id number]   Toxic metabolic encephalopathy   Elevated CK   Generalized weakness   Slurred speech   Left against medical advice   Disposition: Patient left AGAINST MEDICAL ADVICE  Discharge Condition: Stable  Discharge Exam:  General: Alert and oriented to person, place, time Heart: Regular rate and rhythm with no murmurs appreciated Lungs: CTA bilaterally, no wheezing Abdomen: Bowel sounds present, no abdominal pain, no suprapubic pain palpation Skin: Warm and dry Extremities: No lower extremity edema  Brief Hospital Course:   Toxic metabolic delirium, multifocal in the setting polypharmacy and COVID-19 infection:  Patient was admitted to the hospital with altered mental status with some slurring of speech and generalized confusion for 1 day.  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  Patient also had complaints of abdominal pain and generalized myalgias on presentation.  It was found that patient was COVID-19 positive.  There was concern for polypharmacy as the patient had prescriptions for Restoril as well as Xanax and had admitted to taking Xanax prior to coming in with this confusion.  In the ED patient received a head CT and brain MRI which were negative for signs of stroke.  Patient continued to have no signs of focal symptoms.  Throughout the hospitalization patient's confusion seemed to improve and on 1/9  patient was alert and oriented to person, place, time, no longer seemed confused stated he felt back to his baseline.  UTI Patient had urine culture drawn few days prior to admission which showed Enterococcus faecalis.  Patient was initially started on Keflex for this, however repeat culture showed the same species.  Patient was initially started on Unasyn due to question of community-acquired pneumonia per chest x-ray, this was transitioned to nitrofurantoin 100 mg twice daily for 7 days of total antibiotics (1/7-1/11).  Patient left AMA on 05/22/2019 prior to the completion of his antibiotics  COVID-19 viral pneumonia Patient's chest CT showed signs compatible with multifocal viral pneumonia.  Patient was found to be COVID-19 positive on admission.  Patient had a short bout where he had a desat was placed on nasal cannula which prompted initiation of Decadron and remdesivir.  After this patient continued to do well on room air with no further desats and the Decadron was discontinued due to its risk with agitation.  Remdesivir was continued with the plan to discontinue on 1/10, however patient left AMA on 1/9.  Rhabdomyolysis During admission patient was found to have CKD with near 9000 as well as severe myalgias.  Patient spouse did state that the patient had spent the night laying on the hardwood floor on his side on top of a comforter which could be partially to blame for this.  Patient was started on IV fluids and his CK improved.  CK had improved to 2714 on 1/7.  Patient also had an elevated AST on presentation which improved throughout his hospitalization likely related to musculoskeletal damage.  Patient left AMA on 1/9.  Abdominal pain/left hip contusion Patient had complained  of new onset abdominal pain and abdominal CT was ordered as well as a chest CT to further evaluate some x-ray findings concerning for left pleural contusion.  Abdominal CT showed signs of abdominal wall hematoma which could  be posttraumatic.  Patient had bruising on his left hip upon presentation but no fracture was seen on abdominal CT.Marland Kitchen  Neither patient nor his wife were aware of any fall or trauma that could have caused these findings.  Patient's abdominal pain improved throughout the duration of his hospitalization, however he continued to complain of lower back pain.  Spoke with radiology to have a repeat read on his imaging to look for any signs of fractures or other acute findings that could explain Mr. Enslin's back pain and the radiologist agreed with the initial read that none of these were seen.  Issues for Follow Up:  1. Patient did not complete his course of antibiotics having left AMA a few days prior to the completion.  PCP consider determine if patient needs additional antibiotics 2. Patient with complaints of lower back pain throughout the hospitalization, no acute injury seen on imaging, but with marked disc degeneration, PCP recommend additional follow-up. 3. Patient's initial confusion believed to be related to polypharmacy, PCP consider removing Xanax, Restoril etc. if appropriate 4. PCP patient tested positive for COVID-19 during his hospitalization on 1/5 with the patient requesting a repeat test on 1/8 which was also positive.  Recommend patient quarantine per CDC guidelines.  Significant Procedures: None  Significant Labs and Imaging:  Recent Labs  Lab 05/19/19 0521 05/20/19 0345 05/21/19 0500  WBC 10.6* 9.5 8.9  HGB 16.7 15.0 14.5  HCT 49.0 44.6 43.2  PLT 298 304 332   Recent Labs  Lab 05/18/19 1159 05/19/19 0521 05/20/19 0345 05/21/19 0500  NA 134* 136 139 139  K 4.9 4.5 4.8 4.0  CL 97* 100 105 106  CO2 22 24 28 23   GLUCOSE 173* 142* 135* 105*  BUN 33* 38* 30* 25*  CREATININE 0.87 0.74 0.61 0.58*  CALCIUM 8.6* 8.3* 8.1* 8.1*  ALKPHOS 52 44 38 34*  AST 198* 129* 95* 72*  ALT 78* 65* 60* 57*  ALBUMIN 2.7* 2.4* 2.2* 2.2*    CT ABDOMEN PELVIS WO CONTRAST  Result Date:  05/19/2019 CLINICAL DATA:  COVID positive. Fall. Worsening abdominal tenderness, back and pelvis pain. Chest trauma. EXAM: CT CHEST, ABDOMEN AND PELVIS WITHOUT CONTRAST TECHNIQUE: Multidetector CT imaging of the chest, abdomen and pelvis was performed following the standard protocol without IV contrast. COMPARISON:  CT AP 05/14/2019 FINDINGS: CT CHEST FINDINGS Cardiovascular: The heart size appears normal. No pericardial effusion. Previous CABG. Aortic atherosclerosis. Mediastinum/Nodes: Normal appearance of the thyroid gland. The trachea appears patent and is midline. Normal appearance of the esophagus. No mediastinal or hilar adenopathy. Lungs/Pleura: No pleural effusion. Multifocal bilateral patchy areas of airspace consolidation, ground-glass attenuation and interstitial thickening. Findings are compatible with multifocal atypical viral pneumonia. No pneumothorax identified. Musculoskeletal: Spondylosis identified within the thoracic spine. No acute or suspicious osseous findings. CT ABDOMEN PELVIS FINDINGS Hepatobiliary: Diffuse hepatic steatosis. No focal liver abnormality. Gallbladder unremarkable. No biliary dilatation. Pancreas: Unremarkable. No pancreatic ductal dilatation or surrounding inflammatory changes. Spleen: Normal in size without focal abnormality. Adrenals/Urinary Tract: Normal appearance of the adrenal glands. Multiple left renal calculi identified measuring up to 5 mm, image 68/3. No hydronephrosis, hydroureter or ureteral calculi identified. Stomach/Bowel: Stomach is within normal limits. No evidence of bowel wall thickening, distention, or inflammatory changes. Vascular/Lymphatic: Aortic atherosclerosis. Infrarenal  abdominal aorta measures 2.9 cm in maximum AP dimension, image 73/3. No abdominopelvic adenopathy. Reproductive: Prostate is unremarkable. Other: No free fluid or fluid collections. Small umbilical hernia contains fat only. Musculoskeletal: There is asymmetric soft tissue swelling  involving the left lateral abdominal wall concerning for hematoma, image 56/3. no acute osseous abnormality. IMPRESSION: 1. Left lateral abdominal wall hematoma which may be posttraumatic. No underlying osseous abnormality identified 2. Bilateral pulmonary changes compatible with multifocal atypical viral infection. 3. Hepatic steatosis. 4. Nonobstructing left renal calculi. Aortic Atherosclerosis (ICD10-I70.0). Electronically Signed   By: Kerby Moors M.D.   On: 05/19/2019 10:55   DG Chest 2 View  Result Date: 05/18/2019 CLINICAL DATA:  Fall.  Left OG.  History of coronary artery disease. EXAM: CHEST - 2 VIEW COMPARISON:  11/23/2017. FINDINGS: Prior CABG. Stable cardiomegaly. Prominent filtrate noted in the left upper lung. Contusion could also present this fashion. Atelectasis/infiltrate right lung base. No pleural effusion or pneumothorax. Prior cervical spine fusion. Degenerative change thoracic spine. No acute bony abnormality identified. IMPRESSION: 1.  Prior CABG.  Stable cardiomegaly. 2. Prominent infiltrate in the left upper lung. Contusion could also present this fashion. 3.  Mild right base atelectasis/infiltrate. 4.  No acute bony abnormality identified.  No pneumothorax. Electronically Signed   By: Marcello Moores  Register   On: 05/18/2019 10:48   DG Thoracic Spine 2 View  Result Date: 05/18/2019 CLINICAL DATA:  Back pain, lethargy, status post fall EXAM: THORACIC SPINE 2 VIEWS COMPARISON:  None. FINDINGS: There is no evidence of thoracic spine fracture. Alignment is normal. No other significant bone abnormalities are identified. Degenerative disc disease with disc height loss throughout the thoracic spine. Prior anterior cervical fusion partially visualized from C4 through C7. C7 vertebral body screws are fractured. IMPRESSION: No acute osseous injury of the thoracic spine. Electronically Signed   By: Kathreen Devoid   On: 05/18/2019 10:46   DG Lumbar Spine 2-3 Views  Result Date: 05/18/2019 CLINICAL  DATA:  Back pain, lethargy EXAM: LUMBAR SPINE - 2-3 VIEW COMPARISON:  CT AP 05/14/2019. FINDINGS: The alignment of the lumbar spine is normal. The vertebral body heights are well preserved. Multi level disc space narrowing and endplate spurring is noted throughout the lumbar spine. This is most advanced at L1-2. Bilateral lower lumbar spine facet hypertrophy and degenerative change. Aortic atherosclerosis. IMPRESSION: 1. No acute findings. 2. Moderate to severe multilevel degenerative disc disease. Electronically Signed   By: Kerby Moors M.D.   On: 05/18/2019 10:48   CT Head Wo Contrast  Result Date: 05/18/2019 CLINICAL DATA:  Traumatic brain injury. EXAM: CT HEAD WITHOUT CONTRAST TECHNIQUE: Contiguous axial images were obtained from the base of the skull through the vertex without intravenous contrast. COMPARISON:  None. FINDINGS: Brain: No acute intracranial hemorrhage. No focal mass lesion. No CT evidence of acute infarction. No midline shift or mass effect. No hydrocephalus. Basilar cisterns are patent. There are periventricular and subcortical white matter hypodensities. Generalized cortical atrophy. Vascular: No hyperdense vessel or unexpected calcification. Skull: Normal. Negative for fracture or focal lesion. Sinuses/Orbits: Paranasal sinuses and mastoid air cells are clear. Orbits are clear. Other: None. IMPRESSION: 1. No acute intracranial findings. 2. Atrophy and white matter microvascular disease. Electronically Signed   By: Suzy Bouchard M.D.   On: 05/18/2019 12:48   CT CHEST WO CONTRAST  Result Date: 05/19/2019 CLINICAL DATA:  COVID positive. Fall. Worsening abdominal tenderness, back and pelvis pain. Chest trauma. EXAM: CT CHEST, ABDOMEN AND PELVIS WITHOUT CONTRAST TECHNIQUE: Multidetector CT  imaging of the chest, abdomen and pelvis was performed following the standard protocol without IV contrast. COMPARISON:  CT AP 05/14/2019 FINDINGS: CT CHEST FINDINGS Cardiovascular: The heart size  appears normal. No pericardial effusion. Previous CABG. Aortic atherosclerosis. Mediastinum/Nodes: Normal appearance of the thyroid gland. The trachea appears patent and is midline. Normal appearance of the esophagus. No mediastinal or hilar adenopathy. Lungs/Pleura: No pleural effusion. Multifocal bilateral patchy areas of airspace consolidation, ground-glass attenuation and interstitial thickening. Findings are compatible with multifocal atypical viral pneumonia. No pneumothorax identified. Musculoskeletal: Spondylosis identified within the thoracic spine. No acute or suspicious osseous findings. CT ABDOMEN PELVIS FINDINGS Hepatobiliary: Diffuse hepatic steatosis. No focal liver abnormality. Gallbladder unremarkable. No biliary dilatation. Pancreas: Unremarkable. No pancreatic ductal dilatation or surrounding inflammatory changes. Spleen: Normal in size without focal abnormality. Adrenals/Urinary Tract: Normal appearance of the adrenal glands. Multiple left renal calculi identified measuring up to 5 mm, image 68/3. No hydronephrosis, hydroureter or ureteral calculi identified. Stomach/Bowel: Stomach is within normal limits. No evidence of bowel wall thickening, distention, or inflammatory changes. Vascular/Lymphatic: Aortic atherosclerosis. Infrarenal abdominal aorta measures 2.9 cm in maximum AP dimension, image 73/3. No abdominopelvic adenopathy. Reproductive: Prostate is unremarkable. Other: No free fluid or fluid collections. Small umbilical hernia contains fat only. Musculoskeletal: There is asymmetric soft tissue swelling involving the left lateral abdominal wall concerning for hematoma, image 56/3. no acute osseous abnormality. IMPRESSION: 1. Left lateral abdominal wall hematoma which may be posttraumatic. No underlying osseous abnormality identified 2. Bilateral pulmonary changes compatible with multifocal atypical viral infection. 3. Hepatic steatosis. 4. Nonobstructing left renal calculi. Aortic  Atherosclerosis (ICD10-I70.0). Electronically Signed   By: Kerby Moors M.D.   On: 05/19/2019 10:55   MR BRAIN WO CONTRAST  Result Date: 05/18/2019 CLINICAL DATA:  Altered mental status EXAM: MRI HEAD WITHOUT CONTRAST TECHNIQUE: Multiplanar, multiecho pulse sequences of the brain and surrounding structures were obtained without intravenous contrast. COMPARISON:  2016. FINDINGS: Motion artifact is present. Brain: There is no acute infarction or intracranial hemorrhage. There is no intracranial mass, mass effect, or edema. There is no hydrocephalus or extra-axial fluid collection. Prominence of the ventricles and sulci reflects generalized parenchymal volume loss, which has mildly progressed. Patchy T2 hyperintensity in the supratentorial white matter is nonspecific but probably reflects mild chronic microvascular ischemic changes, which have also progressed. Vascular: Major vessel flow voids at the skull base are preserved. Skull and upper cervical spine: Normal marrow signal is preserved. Sinuses/Orbits: Mild mucosal thickening. Bilateral lens replacement.s. Other: Sella is unremarkable. Mastoid air cells are clear. Small right frontal scalp lipoma. IMPRESSION: Suboptimal evaluation due to motion artifact. No evidence of recent infarction, intracranial hemorrhage, or mass. Chronic microvascular ischemic changes and parenchymal volume loss, increased since 2016. Electronically Signed   By: Macy Mis M.D.   On: 05/18/2019 16:33   CT Renal Stone Study  Result Date: 05/14/2019 CLINICAL DATA:  Hematuria.  Evaluate for nephrolithiasis. EXAM: CT ABDOMEN AND PELVIS WITHOUT CONTRAST TECHNIQUE: Multidetector CT imaging of the abdomen and pelvis was performed following the standard protocol without IV contrast. COMPARISON:  None. FINDINGS: Lower chest: Limited visualization of the lower thorax demonstrates minimal bibasilar ground-glass atelectasis. No discrete focal airspace opacities. No pleural effusion. Normal  heart size. Coronary artery calcifications. Post median sternotomy. Hepatobiliary: Normal hepatic contour. There is diffuse decreased attenuation hepatic parenchyma compatible with hepatic steatosis. Normal noncontrast appearance of the gallbladder. No radiopaque gallstones. No ascites. Pancreas: Normal noncontrast appearance of the pancreas. Spleen: Normal noncontrast appearance of the spleen. Adrenals/Urinary Tract:  There are 2 punctate (sub 5 mm) nonobstructing stones within the inferior pole the left kidney (coronal image 64, series 6). No right-sided renal stones. Suspected left-sided UPJ stenosis. Grossly symmetric bilateral likely age and body habitus related perinephric stranding without evidence of urinary obstruction. Normal appearance of the urinary bladder given degree of distention. Normal noncontrast appearance of the bilateral adrenal glands. Stomach/Bowel: Moderate colonic stool burden without evidence of enteric obstruction. Scattered colonic diverticulosis without evidence of superimposed acute diverticulitis. Normal noncontrast appearance of the terminal ileum. The appendix is not visualized however there is no pericecal inflammatory change. No pneumoperitoneum, pneumatosis or portal venous gas. Vascular/Lymphatic: There is a large amount of calcified atherosclerotic plaque within a tortuous and mildly ectatic abdominal aorta which measures approximately 2.7 cm in maximal diameter (image 36, series 3). No periaortic stranding. No bulky retroperitoneal, mesenteric, pelvic or inguinal lymphadenopathy. Reproductive: Dystrophic calcifications within normal sized prostate gland. No free fluid within the pelvic cul-de-sac. Other: Tiny mesenteric fat containing periumbilical hernia. Musculoskeletal: No acute or aggressive osseous abnormalities. Moderate to severe multilevel lumbar spine DDD, worse at L1-L2, L2-L3 and L5-S1 with disc space height loss, endplate irregularity and small posteriorly directed  disc osteophyte complexes at these locations. IMPRESSION: 1. Left-sided nephrolithiasis and suspected left UPJ stenosis without evidence of urinary obstruction. Otherwise, no explanation for patient's hematuria. 2. No evidence of right-sided nephrolithiasis. 3. Hepatic steatosis.  Correlation with LFTs is advised. 4. Colonic diverticulosis without evidence of superimposed acute diverticulitis. 5. Coronary calcifications.  Aortic Atherosclerosis (ICD10-I70.0). 6. Mild ectasia of the abdominal aorta measuring 2.7 cm in diameter. Recommend follow-up aortic ultrasound in 5 years. This recommendation follows ACR consensus guidelines: White Paper of the ACR Incidental Findings Committee II on Vascular Findings. J Am Coll Radiol 2013; 10:789-794. Electronically Signed   By: Sandi Mariscal M.D.   On: 05/14/2019 11:29   Results/Tests Pending at Time of Discharge: None  Discharge Medications:  Allergies as of 05/22/2019      Reactions   Contrast Media [iodinated Diagnostic Agents] Other (See Comments)   Vascular headache      Medication List    TAKE these medications   acetaminophen 500 MG tablet Commonly known as: TYLENOL Take 1,000 mg by mouth every 6 (six) hours as needed for mild pain or headache.   ALPRAZolam 1 MG tablet Commonly known as: XANAX Take 1 mg by mouth 2 (two) times daily as needed for anxiety.   amphetamine-dextroamphetamine 30 MG tablet Commonly known as: ADDERALL Take 30 mg by mouth daily as needed (for narcolepsy).   aspirin 81 MG EC tablet Take 1 tablet (81 mg total) by mouth daily.   buPROPion 300 MG 24 hr tablet Commonly known as: WELLBUTRIN XL Take 300 mg by mouth daily.   Bystolic 20 MG Tabs Generic drug: Nebivolol HCl TAKE 1/2 TABLET BY MOUTH DAILY What changed: how much to take   finasteride 5 MG tablet Commonly known as: PROSCAR Take 1 tablet (5 mg total) by mouth daily.   gabapentin 300 MG capsule Commonly known as: NEURONTIN Take 300 mg by mouth 3 (three)  times daily.   hydrOXYzine 25 MG tablet Commonly known as: ATARAX/VISTARIL Take 25 mg by mouth daily.   lisinopril 40 MG tablet Commonly known as: ZESTRIL TAKE 1 TABLET BY MOUTH EVERY DAY   magnesium oxide 400 MG tablet Commonly known as: MAG-OX Take 400 mg by mouth daily as needed (muscle cramps).   rosuvastatin 10 MG tablet Commonly known as: CRESTOR Take 1 tablet (10  mg total) by mouth daily.   tadalafil 5 MG tablet Commonly known as: CIALIS TAKE ONE TABLET BY MOUTH DAILY AS NEEDED FOR ERECTILE DYSFUNCTION What changed: See the new instructions.   temazepam 15 MG capsule Commonly known as: RESTORIL Take 15 mg by mouth at bedtime as needed for sleep.   traMADol 50 MG tablet Commonly known as: ULTRAM TAKE 1 OR 2 TABLETS EVERY 6 HOURS AS NEEDED NFOR PAIN What changed:   how much to take  how to take this  when to take this  reasons to take this  additional instructions       Discharge Instructions: Please refer to Patient Instructions section of EMR for full details.  Patient was counseled important signs and symptoms that should prompt return to medical care, changes in medications, dietary instructions, activity restrictions, and follow up appointments.   Follow-Up Appointments:   Lurline Del, DO 05/22/2019, 2:15 PM PGY-1, [place], MD, [place] - PGY3 05/22/2019 2:21 PM

## 2019-05-21 LAB — D-DIMER, QUANTITATIVE: D-Dimer, Quant: 3.52 ug/mL-FEU — ABNORMAL HIGH (ref 0.00–0.50)

## 2019-05-21 LAB — COMPREHENSIVE METABOLIC PANEL
ALT: 57 U/L — ABNORMAL HIGH (ref 0–44)
AST: 72 U/L — ABNORMAL HIGH (ref 15–41)
Albumin: 2.2 g/dL — ABNORMAL LOW (ref 3.5–5.0)
Alkaline Phosphatase: 34 U/L — ABNORMAL LOW (ref 38–126)
Anion gap: 10 (ref 5–15)
BUN: 25 mg/dL — ABNORMAL HIGH (ref 8–23)
CO2: 23 mmol/L (ref 22–32)
Calcium: 8.1 mg/dL — ABNORMAL LOW (ref 8.9–10.3)
Chloride: 106 mmol/L (ref 98–111)
Creatinine, Ser: 0.58 mg/dL — ABNORMAL LOW (ref 0.61–1.24)
GFR calc Af Amer: >60 mL/min (ref >60)
GFR calc non Af Amer: >60 mL/min (ref >60)
Glucose, Bld: 105 mg/dL — ABNORMAL HIGH (ref 70–99)
Potassium: 4 mmol/L (ref 3.5–5.1)
Sodium: 139 mmol/L (ref 135–145)
Total Bilirubin: 0.6 mg/dL (ref 0.3–1.2)
Total Protein: 5 g/dL — ABNORMAL LOW (ref 6.5–8.1)

## 2019-05-21 LAB — CBC
HCT: 43.2 % (ref 39.0–52.0)
Hemoglobin: 14.5 g/dL (ref 13.0–17.0)
MCH: 31.9 pg (ref 26.0–34.0)
MCHC: 33.6 g/dL (ref 30.0–36.0)
MCV: 94.9 fL (ref 80.0–100.0)
Platelets: 332 10*3/uL (ref 150–400)
RBC: 4.55 MIL/uL (ref 4.22–5.81)
RDW: 12.6 % (ref 11.5–15.5)
WBC: 8.9 10*3/uL (ref 4.0–10.5)
nRBC: 0 % (ref 0.0–0.2)

## 2019-05-21 LAB — SARS CORONAVIRUS 2 (TAT 6-24 HRS): SARS Coronavirus 2: POSITIVE — AB

## 2019-05-21 LAB — FERRITIN: Ferritin: 1385 ng/mL — ABNORMAL HIGH (ref 24–336)

## 2019-05-21 LAB — LACTATE DEHYDROGENASE: LDH: 317 U/L — ABNORMAL HIGH (ref 98–192)

## 2019-05-21 LAB — C-REACTIVE PROTEIN: CRP: 4.8 mg/dL — ABNORMAL HIGH (ref <1.0)

## 2019-05-21 MED ORDER — GABAPENTIN 300 MG PO CAPS
300.0000 mg | ORAL_CAPSULE | Freq: Two times a day (BID) | ORAL | Status: DC
  Administered 2019-05-21: 300 mg via ORAL
  Filled 2019-05-21 (×3): qty 1

## 2019-05-21 MED ORDER — TRAMADOL-ACETAMINOPHEN 37.5-325 MG PO TABS
1.0000 | ORAL_TABLET | Freq: Once | ORAL | Status: AC
  Administered 2019-05-21: 1 via ORAL
  Filled 2019-05-21: qty 1

## 2019-05-21 MED ORDER — GABAPENTIN 300 MG PO CAPS: 300.0000 mg | ORAL_CAPSULE | Freq: Three times a day (TID) | ORAL | Status: DC

## 2019-05-21 MED ORDER — BUPROPION HCL 75 MG PO TABS
75.0000 mg | ORAL_TABLET | Freq: Two times a day (BID) | ORAL | Status: DC
  Administered 2019-05-21: 75 mg via ORAL
  Filled 2019-05-21 (×3): qty 1

## 2019-05-21 MED ORDER — ACETAMINOPHEN 500 MG PO TABS
1000.0000 mg | ORAL_TABLET | Freq: Three times a day (TID) | ORAL | Status: DC
  Administered 2019-05-21 – 2019-05-22 (×2): 1000 mg via ORAL
  Filled 2019-05-21 (×3): qty 2

## 2019-05-21 MED ORDER — DICLOFENAC SODIUM 1 % EX GEL
2.0000 g | Freq: Four times a day (QID) | CUTANEOUS | Status: DC
  Administered 2019-05-21 (×4): 2 g via TOPICAL
  Filled 2019-05-21: qty 100

## 2019-05-21 MED ORDER — ENOXAPARIN SODIUM 40 MG/0.4ML ~~LOC~~ SOLN
40.0000 mg | SUBCUTANEOUS | Status: DC
  Filled 2019-05-21: qty 0.4

## 2019-05-21 NOTE — Evaluation (Signed)
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 At time of eval, pt presents very agitated and restless due to LB pain. He required max cues and redirection to engage in session. He also presents with limited awareness of current deficits, stating he will just sign out of hospital and take his pain meds he has at home. Pt completed bed mobility at max A +2 for log roll technique to reduce pain. Stedy then used with max A +2 for sit <> stand to recliner. Once up in recliner, heat placed on pts back. He stated this position was more comfortable. Given his current status, recommend SNF at d/c for continued progression in functional mobility and BADL. Will continue to follow per POC listed below.     Follow Up Recommendations  SNF;Supervision/Assistance - 24 hour    Equipment Recommendations  Other (comment)(TBD)    Recommendations for Other Services       Precautions / Restrictions Precautions Precautions: Fall Precaution Comments: severe back pain Restrictions Weight Bearing Restrictions: No      Mobility Bed Mobility Overal bed mobility: Needs Assistance Bed Mobility: Sidelying to Sit   Sidelying to sit: Max assist;+2 for physical assistance       General bed mobility comments: max A +2 to initiate log roll technique for back pain. Constant cues and redirection needed. Assit needed for BLE translation as well as truncal support to sit EOB  Transfers Overall transfer level: Needs assistance Equipment used: Ambulation equipment used Transfers: Sit to/from Stand Sit to Stand: Max assist;+2 physical  assistance;+2 safety/equipment         General transfer comment: max A +2 to rise within stedy    Balance Overall balance assessment: Needs assistance Sitting-balance support: Bilateral upper extremity supported;Feet unsupported Sitting balance-Leahy Scale: Fair     Standing balance support: Bilateral upper extremity supported;During functional activity Standing balance-Leahy Scale: Zero Standing balance comment: reliant on stedy and max A +2 for OOB activities                           ADL either performed or assessed with clinical judgement   ADL Overall ADL's : Needs assistance/impaired Eating/Feeding: Set up;Sitting   Grooming: Set up;Sitting   Upper Body Bathing: Moderate assistance;Sitting   Lower Body Bathing: Total assistance;Sitting/lateral leans;Sit to/from stand   Upper Body Dressing : Moderate assistance;Sitting   Lower Body Dressing: Total assistance;Sitting/lateral leans;Sit to/from stand   Toilet Transfer: Maximal assistance;+2 for physical assistance;+2 for safety/equipment Toilet Transfer Details (indicate cue type and reason): use of stedy, simulated to chair Toileting- Clothing Manipulation and Hygiene: Maximal assistance;Sit to/from stand       Functional mobility during ADLs: Maximal assistance;+2 for physical assistance;+2 for safety/equipment(use of stedy only) General ADL Comments: pt limited by cognitive deficits, increased back pain, with decreased ability to engage in BADL     Vision Patient Visual Report: No change from baseline       Perception     Praxis      Pertinent Vitals/Pain Pain Assessment: Faces Faces Pain Scale: Hurts worst  Pain Location: back Pain Descriptors / Indicators: Aching Pain Intervention(s): Limited activity within patient's tolerance;Monitored during session;Repositioned;Heat applied;Patient requesting pain meds-RN notified     Hand Dominance     Extremity/Trunk Assessment Upper Extremity  Assessment Upper Extremity Assessment: Generalized weakness   Lower Extremity Assessment Lower Extremity Assessment: Defer to PT evaluation       Communication Communication Communication: No difficulties   Cognition Arousal/Alertness: Awake/alert Behavior During Therapy: Agitated;Anxious;Restless Overall Cognitive Status: Impaired/Different from baseline Area of Impairment: Orientation;Memory;Safety/judgement;Problem solving                 Orientation Level: Disoriented to;Situation   Memory: Decreased short-term memory   Safety/Judgement: Decreased awareness of safety;Decreased awareness of deficits   Problem Solving: Slow processing;Requires verbal cues;Requires tactile cues General Comments: pt agitated and restless due to low back pain. Stating that he would rather die than have to live with this pain. Required constant redirection and cues to engage in position change. He does not recall what brought him to the hospital, and has limitiations in his safety/awareness of deficits   General Comments       Exercises     Shoulder Instructions      Home Living Family/patient expects to be discharged to:: Private residence Living Arrangements: Spouse/significant other Available Help at Discharge: Family;Available 24 hours/day Type of Home: House Home Access: Stairs to enter [employer] of Steps: 2   Home Layout: Multi-level;Able to live on main level with bedroom/bathroom     Bathroom Shower/Tub: Teacher, early years/pre: Standard     Home Equipment: Shower seat          Prior Functioning/Environment Level of Independence: Independent        Comments: Buyer, retail on COVID unit        OT Problem List: Decreased strength;Decreased knowledge of use of DME or AE;Decreased activity tolerance;Decreased cognition;Impaired balance (sitting and/or standing);Decreased safety awareness;Pain      OT Treatment/Interventions:  Self-care/ADL training;Therapeutic exercise;Patient/family education;Balance training;Energy conservation;Therapeutic activities;DME and/or AE instruction;Cognitive remediation/compensation    OT Goals(Current goals can be found in the care plan section) Acute Rehab OT Goals Patient Stated Goal: go home and/or check out of hospital OT Goal Formulation: With patient Time For Goal Achievement: 06/04/19 Potential to Achieve Goals: Good  OT Frequency: Min 2X/week   Barriers to D/C:            Co-evaluation PT/OT/SLP Co-Evaluation/Treatment: Yes Reason for Co-Treatment: Complexity of the patient's impairments (multi-system involvement);Necessary to address cognition/behavior during functional activity;For patient/therapist safety;To address functional/ADL transfers PT goals addressed during session: Mobility/safety with mobility;Proper use of DME OT goals addressed during session: ADL's and self-care;Proper use of Adaptive equipment and DME      AM-PAC OT "6 Clicks" Daily Activity     Outcome Measure Help from another person eating meals?: A Little Help from another person taking care of personal grooming?: A Little Help from another person toileting, which includes using toliet, bedpan, or urinal?: A Lot Help from another person bathing (including washing, rinsing, drying)?: A Lot Help from another person to put on and taking off regular upper body clothing?: A Lot Help from another person to put on and taking off regular lower body clothing?: Total 6 Click Score: 13   End of Session Equipment Utilized During Treatment: Gait belt;Other (comment)(stedy) Nurse Communication: Mobility status;Need for lift equipment  Activity Tolerance: Patient tolerated treatment well Patient left: in chair;with call bell/phone within reach;with chair alarm set  OT Visit  Diagnosis: Unsteadiness on feet (R26.81);Other abnormalities of gait and mobility (R26.89);Muscle weakness (generalized)  (M62.81);Other symptoms and signs involving cognitive function;Pain Pain - part of body: (back)                Time: [id number] OT Time Calculation (min): 40 min Charges:  OT General Charges $OT Visit: 1 Visit OT Evaluation $OT Eval Moderate Complexity: 1 Mod  Zenovia Jarred, MSOT, OTR/L Acute Rehabilitation Services [facility] Office Number: [phone]  Zenovia Jarred 05/21/2019, 1:24 PM

## 2019-05-21 NOTE — Evaluation (Signed)
Physical Therapy Evaluation Patient Details Name: Ivan [name] MRN: [id number] DOB: January 22, 1946 Today's Date: 05/21/2019   History of Present Illness  74 yo male admitted [place] (+)viral PNA rhabdomyolsis UTI (+) with a fall and L hip contusion PMH HX MI CVA DDD HTN HLD Depression anxiety CAD s/p CABG x4 PTSD   Clinical Impression  PTA pt living with wife in multilevel home with 2 steps to enter and bed and bath on first floor. 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 Pt is currently limited in safe mobility by decreased cognition in terms of safety awareness and current deficits in presence of intractable low back pain limiting his movement. Pt reports he is ready to leave AMA because his pain is not being addressed adequately. Explained tools for pain reduction available through therapy including positional change, deep breathing and K-pad placement. Pt requires supervision for pt to roll on to L side to reduce torque on his spine, but then requires maxAx2 for sidelying to sit and sit>stand in [place] for transfer to recliner. Once in recliner pt reports still has pain but feel better sitting up with Kpad on back. Pt agrees with therapy that his wife will not be able to provide adequate assistance at home. PT recommending SNF level rehab at discharge to improve mobility. PT will continue to follow acutely.      Follow Up Recommendations SNF    Equipment Recommendations  None recommended by PT       Precautions / Restrictions Precautions Precautions: Fall Precaution Comments: severe back pain Restrictions Weight Bearing Restrictions: No      Mobility  Bed Mobility Overal bed mobility: Needs Assistance Bed Mobility: Sidelying to Sit   Sidelying to sit: Max assist;+2 for physical assistance       General bed mobility comments: max A +2 to initiate log roll technique for back pain. Constant cues and redirection needed. Assit needed for BLE translation as well  as truncal support to sit EOB  Transfers Overall transfer level: Needs assistance Equipment used: Ambulation equipment used Transfers: Sit to/from Stand Sit to Stand: Max assist;+2 physical assistance;+2 safety/equipment         General transfer comment: max A +2 to rise within stedy      Balance Overall balance assessment: Needs assistance Sitting-balance support: Bilateral upper extremity supported;Feet unsupported Sitting balance-Leahy Scale: Fair     Standing balance support: Bilateral upper extremity supported;During functional activity Standing balance-Leahy Scale: Zero Standing balance comment: reliant on stedy and max A +2 for OOB activities                             Pertinent Vitals/Pain Pain Assessment: Faces Faces Pain Scale: Hurts worst Pain Location: back Pain Descriptors / Indicators: Aching Pain Intervention(s): Limited activity within patient's tolerance;Monitored during session;Repositioned    Home Living Family/patient expects to be discharged to:: Private residence Living Arrangements: Spouse/significant other Available Help at Discharge: Family;Available 24 hours/day Type of Home: House Home Access: Stairs to enter   [employer] of Steps: 2 Home Layout: Multi-level;Able to live on main level with bedroom/bathroom Home Equipment: Shower seat      Prior Function Level of Independence: Independent         Comments: Buyer, retail on COVID unit        Extremity/Trunk Assessment   Upper Extremity Assessment Upper Extremity Assessment: Defer to OT evaluation    Lower Extremity  Assessment Lower Extremity Assessment: RLE deficits/detail;LLE deficits/detail RLE Deficits / Details: ROM WFL, strength grossly 3+/5, pt with complaints of aching pain from back to bilateral hips RLE Sensation: decreased light touch RLE Coordination: decreased fine motor LLE Deficits / Details: ROM WFL, strength grossly 3+/5, pt with  complaints of aching pain from back to bilateral hips LLE Sensation: decreased light touch LLE Coordination: decreased fine motor       Communication   Communication: No difficulties  Cognition Arousal/Alertness: Awake/alert Behavior During Therapy: Agitated;Anxious;Restless Overall Cognitive Status: Impaired/Different from baseline Area of Impairment: Orientation;Memory;Safety/judgement;Problem solving                 Orientation Level: Disoriented to;Situation   Memory: Decreased short-term memory   Safety/Judgement: Decreased awareness of safety;Decreased awareness of deficits   Problem Solving: Slow processing;Requires verbal cues;Requires tactile cues General Comments: pt agitated and restless due to low back pain. Stating that he would rather die than have to live with this pain. Required constant redirection and cues to engage in position change. He does not recall what brought him to the hospital, and has limitiations in his safety/awareness of deficits      General Comments General comments (skin integrity, edema, etc.): VSS        Assessment/Plan    PT Assessment Patient needs continued PT services  PT Problem List Decreased strength;Decreased activity tolerance;Decreased balance;Decreased mobility;Decreased cognition;Decreased safety awareness;Pain       PT Treatment Interventions DME instruction;Gait training;Stair training;Functional mobility training;Therapeutic activities;Therapeutic exercise;Balance training;Cognitive remediation;Patient/family education    PT Goals (Current goals can be found in the Care Plan section)  Acute Rehab PT Goals Patient Stated Goal: go home and/or check out of hospital PT Goal Formulation: With patient Time For Goal Achievement: 06/04/19 Potential to Achieve Goals: Fair    Frequency Min 3X/week        Co-evaluation PT/OT/SLP Co-Evaluation/Treatment: Yes Reason for Co-Treatment: For patient/therapist  safety;Necessary to address cognition/behavior during functional activity PT goals addressed during session: Mobility/safety with mobility;Proper use of DME OT goals addressed during session: ADL's and self-care;Proper use of Adaptive equipment and DME       AM-PAC PT "6 Clicks" Mobility  Outcome Measure Help needed turning from your back to your side while in a flat bed without using bedrails?: A Lot Help needed moving from lying on your back to sitting on the side of a flat bed without using bedrails?: Total Help needed moving to and from a bed to a chair (including a wheelchair)?: Total Help needed standing up from a chair using your arms (e.g., wheelchair or bedside chair)?: Total Help needed to walk in hospital room?: Total Help needed climbing 3-5 steps with a railing? : Total 6 Click Score: 7    End of Session Equipment Utilized During Treatment: Gait belt Activity Tolerance: Patient limited by pain Patient left: in chair;with call bell/phone within reach;with chair alarm set Nurse Communication: Mobility status PT Visit Diagnosis: Other (comment);Muscle weakness (generalized) (M62.81);Difficulty in walking, not elsewhere classified (R26.2);Other abnormalities of gait and mobility (R26.89);Unsteadiness on feet (R26.81);History of falling (Z91.81)    Time: [id number] PT Time Calculation (min) (ACUTE ONLY): 40 min   Charges:   PT Evaluation $PT Eval Moderate Complexity: 1 Mod PT Treatments $Gait Training: 8-22 mins        Khalel Alms B. Migdalia Dk PT, DPT Acute Rehabilitation Services Pager [phone] Office [phone]   [place] 05/21/2019, 1:50 PM

## 2019-05-21 NOTE — Progress Notes (Signed)
Went into room this morning and patient was irate.  He was on the phone with his wife yelling and arguing with her about his pain.  I asked him to calm down.  He was a bit rude.  He told me he was possibly signing out AMA because he wanted to go home and take some oxycodone he had at home.  I did a pain assessment with him, charted in Epic.  I gave him his prn Tramadol and put some hot packs on his lower back.  He liked the heat.   Seems to be calm for the moment.  I will keep an eye on him reassess his pain and reach out to MD if need be.

## 2019-05-21 NOTE — Progress Notes (Signed)
Spoke to radiology regarding Ivan [name]'s complaint of lower back/sacral pain.  Radiologist was kind enough to pull up his previous CT abdomen pelvis to check for any signs of sacral or lower lumbar fractures.  Radiologist stated that while an MR would be more sensitive he sees no evidence for any sacral or lumbar fractures at this time.  He does state the patient has marked degenerative disc disease which could be the cause of his lower back pain.
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
## 2019-05-21 NOTE — Progress Notes (Signed)
Family Medicine Teaching Service Daily Progress Note Intern Pager: [phone]  Patient name: Ivan [name] Medical record number: [id number] Date of birth: [birth year] Age: 74 y.o. Gender: male  Primary Care Provider: Sherald Hess., MD Consultants: None Code Status: Full  Pt Overview and Major Events to Date:  1/5-admitted  Assessment and Plan:  Toxic metabolic delirium, multifactorial in the setting of polypharmacy and COVID-19 infection Patient mental status appears to continue to improve, he is currently alert and oriented to person, place, time.  Repeat D-dimer, LDH pending.  D-dimer initially elevated at 7.10.  LDH initially elevated at 413. The cause of his altered mental status likely multifactorial.  Patient with polypharmacy including Xanax, tramadol, gabapentin as well as delirium in the setting of infection.  Patient does have recent history of UTI which may have not been well treated as well as his current COVID-19 infection. Patient also apparently had a UTI recently that grew greater than 100,000 colonies of Enterococcus faecalis on 1/1 for which the patient was given Keflex, this may have not provided good coverage.  Patient's white blood cell count on presentation was elevated at 12.2, currently within normal limits of 8.9.  Urine cultures grew Enterococcus faecalis that was sensitive to nitrofurantoin. -Continuous cardiac monitoring, continuous pulse ox -IV fluids at 113mL/h -Discontinue dexamethasone  -Continue remdesivir (day 3 of 5) -Monitor CMP and CBC -Monitor patient's CK -We will treat urine with nitrofurantoin 100 mg twice daily. -Monitor respiratory status for signs of pneumonia -Delirium precautions -Airborne/contact precautions as patient is COVID-19 positive. -CIWA's -Patient on Lovenox 40 twice daily due to elevated D-dimer in setting of COVID-19 infection.  Per pharmacy we will continue this for another day as we continue to trend his  D-dimer.  COVID-19 Viral pneumonia Chest CT performed yesterday did show signs compatible with multifocal viral pneumonia.  Patient is COVID-19 positive.  Chest CT shows bilateral pulmonary changes controlled with multifocal atypical viral infection which is consistent with his COVID-19 infection.  There was a period on 1/6 where patient required oxygen via nasal cannula due to some desats.  This prompted the initiation of Decadron and remdesivir.  Decadron was discontinued due to his propensity for increased agitation.  Patient currently breathing well on room air. -Continue to monitor patient's respiratory status -Continue to follow inflammatory lab markers as noted above -Continue airborne/contact precautions as patient is COVID-19 positive  Rhabdomyolysis Improving, most recent CK 2714.  Initial CK of 8929 with associated severe myalgias.  Renal function appears stable with creatinine of 0.61.  Patient with initial elevated AST which is also improving and is likely due to musculoskeletal damage.  Differential can include medication, viral infection such as influenza/Covid, trauma, dermatomyositis/polymyositis, severe dehydration, immobility, seizures, or strenuous exercise.  Per patient's wife patient was lying on his side overnight prior to admission on the floor on top of a comforter but would not expect this to cause such a dramatic increase in CK.  Patient is found to be COVID-19 positive which could explain his rhabdomyolysis. -Fluids at 100cc/hour -Continue to monitor renal function and CK -Hold patient's home statin -Continue to monitor  UTI White blood cell count now within normal limits at 8.9.  Was initially 12.2 on admission.  Several day history of dysuria with urine culture showing greater than 100,000 colonies of E faecalis, sensitive to nitrofurantoin.  -Continue nitrofurantoin 100 mg twice daily  Abdominal pain/left hip contusion Due to complaint of new onset abdominal pain  yesterday abdominal CT was ordered.  This showed signs of a lateral abdominal wall hematoma which could be posttraumatic.   There was no sign of fracture to the patient's left hip. A chest CT which was also performed and showed changes compatible with viral pneumonia.  Patient's previous x-ray also shows findings suggestive of left lung contusion.  Patient denies any knowledge of following traumatic incident and upon speaking with his wife she also denies any knowledge of any fall or trauma.  CAD, history of MI Patient with history of STEMI in 2016 per chart review.  Patient also appears to have had myocardial infarction in 2012 per chart review.    Troponins not concerning for MI at this time.  Home medications include lisinopril, Nebivolol, aspirin 81 mg.  Patient also takes Crestor. -Continue home medications. -Hold patient home Crestor  History of stroke Per chart review patient appears to be on Plavix for this.  Plavix does not appear to be on patient's outpatient medication list and patient's wife is unsure of some of his meds. -We will discuss with patient as he becomes back to his baseline.  Back pain/degenerative disc disease Patient states this is his primary complaint today.  With x-rays negative for signs of fracture I think this is most likely related to a fall he may have had.  He currently denies any knowledge of a fall but with his imaging this seems like a likely possibility.  X-ray of lumbar spine on presentation shows no acute findings with moderate to severe multilevel disc degenerative disease.  X-ray of thoracic spine shows no acute osseous injury with prior anterior cervical fusion partially visualized from C4-C7. -Provide K pad and a lidocaine patch, and acetaminophen for pain control -Recommend outpatient follow-up -Tramadol every 6 hours as needed -We will also add Voltaren gel as topical treatment for his back pain.  Hypertension Normotensive on admission.  Home  medications include lisinopril.   -Continue home medications  Hyperlipidemia Home medications include rosuvastatin -Hold patient's home rosuvastatin due to elevated LFTs/rhabdomyolysis  Depression/anxiety Home medications include Wellbutrin.  Patient also has home medication for Xanax, hydroxyzine, Restoril. -Hold patient's Xanax, hydroxyzine, Restoril due to mental status. -We will hold Wellbutrin  BPH Home medications include finasteride -Restart patient's finasteride  FEN/GI: Normal diet PPx: Lovenox 40 twice daily  Disposition: We will likely discharge upon completion of 5 days of remdesivir as patient continues to improve.  Subjective:  Patient main complaint this morning is what he calls "intractable lower back pain".  He states that this is not his usual back pain and that he normally does not have back pain unless he is walking around a lot.  He states his abdominal pain has dramatically improved as well as his UTI symptoms.  He denies shortness of breath at this time.  Overall somewhat limited in what we can provide his pain control as patient is still in the midst of some confusion, as well as having some contraindications currently to NSAIDs.    Objective: Temp:  [97.4 F (36.3 C)-98.6 F (37 C)] 98.4 F (36.9 C) (01/08 0430) Pulse Rate:  [53-59] 54 (01/08 0430) Resp:  [14-18] 16 (01/08 0430) BP: (99-133)/(51-73) 99/51 (01/08 0430) SpO2:  [92 %-99 %] 93 % (01/08 0430) Physical Exam:  General: Alert and oriented to person, place, time.  Does not appear in distress but states he has severe back pain. Heart: Regular rate and rhythm with no murmurs appreciated Lungs: CTA bilaterally, no wheezing Abdomen: Bowel sounds present, only slight abdominal discomfort to palpation,  primarily located in the left side where his hematoma was shown on imaging. Skin: Warm and dry  Laboratory: Recent Labs  Lab 05/18/19 1159 05/19/19 0521 05/20/19 0345  WBC 12.2* 10.6* 9.5   HGB 17.9* 16.7 15.0  HCT 53.5* 49.0 44.6  PLT 245 298 304   Recent Labs  Lab 05/18/19 1159 05/19/19 0521 05/20/19 0345  NA 134* 136 139  K 4.9 4.5 4.8  CL 97* 100 105  CO2 22 24 28   BUN 33* 38* 30*  CREATININE 0.87 0.74 0.61  CALCIUM 8.6* 8.3* 8.1*  PROT 6.6 6.1* 5.2*  BILITOT 1.0 0.8 0.9  ALKPHOS 52 44 38  ALT 78* 65* 60*  AST 198* 129* 95*  GLUCOSE 173* 142* 135*      Imaging/Diagnostic Tests:   Lurline Del, DO 05/21/2019, 6:16 AM PGY-1, [place] Intern pager: [phone], text pages welcome

## 2019-05-22 DIAGNOSIS — Z5329 Procedure and treatment not carried out because of patient's decision for other reasons: Secondary | ICD-10-CM

## 2019-05-22 MED ORDER — TRAMADOL HCL 50 MG PO TABS
50.0000 mg | ORAL_TABLET | Freq: Four times a day (QID) | ORAL | Status: DC | PRN
  Administered 2019-05-22: 50 mg via ORAL
  Filled 2019-05-22: qty 1

## 2019-05-22 MED ORDER — TRAMADOL HCL 50 MG PO TABS
25.0000 mg | ORAL_TABLET | Freq: Once | ORAL | Status: AC
  Administered 2019-05-22: 25 mg via ORAL
  Filled 2019-05-22: qty 1

## 2019-05-22 MED ORDER — TRAMADOL HCL 50 MG PO TABS: 25.0000 mg | ORAL_TABLET | Freq: Once | ORAL | Status: DC

## 2019-05-22 NOTE — Progress Notes (Signed)
Pt requested to leave AMA d/t unaddressed pain needs. MD paged. Wife called for pick up. AMA paper signed by patient. Transported via w/c to private auto.

## 2019-05-22 NOTE — Progress Notes (Signed)
Family Medicine Teaching Service Daily Progress Note Intern Pager: [phone]  Patient name: Ivan [name] Medical record number: [id number] Date of birth: 1946/03/23 Age: 74 y.o. Gender: male  Primary Care Provider: Sherald Hess., MD Consultants: None Code Status: Full  Pt Overview and Major Events to Date:  1/5-admitted  Assessment and Plan:  Toxic metabolic delirium, multifactorial in the setting of polypharmacy and COVID-19 infection Patient believes he is back to his baseline mental status and states that he believes his wife would say the same if she were asked.  Currently alert and oriented to person, place, time.  Repeat D-dimer 3.52, LDH 3.17.  D-dimer initially elevated at 7.10.  LDH initially elevated at 413. The cause of his altered mental status likely multifactorial.  Patient with polypharmacy including Xanax, tramadol, gabapentin as well as delirium in the setting of infection.  Patient does have recent history of UTI which may have not been well treated as well as his current COVID-19 infection. Patient also apparently had a UTI recently that grew greater than 100,000 colonies of Enterococcus faecalis on 1/1 for which the patient was given Keflex, this may have not provided good coverage.  Patient's white blood cell count on presentation was elevated at 12.2, currently within normal limits of 8.9.  Urine cultures grew Enterococcus faecalis that was sensitive to nitrofurantoin. -Continuous cardiac monitoring, continuous pulse ox -IV fluids at 165mL/h -Discontinue dexamethasone  -Continue remdesivir (day 4 of 5) -Monitor CMP and CBC -Monitor patient's CK -We will treat urine with nitrofurantoin 100 mg twice daily. -Monitor respiratory status for signs of pneumonia -Delirium precautions -Airborne/contact precautions as patient is COVID-19 positive. -CIWA's -Patient on Lovenox 40 twice daily due to elevated D-dimer in setting of COVID-19 infection.  Per pharmacy we will  continue this for another day as we continue to trend his D-dimer.  COVID-19 Viral pneumonia Chest CT performed during hospitalization did show signs compatible with multifocal viral pneumonia.  Patient is COVID-19 positive.  Chest CT shows bilateral pulmonary changes controlled with multifocal atypical viral infection which is consistent with his COVID-19 infection.  There was a period on 1/6 where patient required oxygen via nasal cannula due to some desats.  This prompted the initiation of Decadron and remdesivir.  Decadron was discontinued due to his propensity for increased agitation.  Patient currently breathing well on room air. -Continue to monitor patient's respiratory status -Continue to follow inflammatory lab markers as noted above -Continue airborne/contact precautions as patient is COVID-19 positive  Rhabdomyolysis Improving, most recent CK 2714.  Initial CK of 8929 with associated severe myalgias.  Renal function appears stable with creatinine of 0.58.  Patient with initial elevated AST which is also improving and is likely due to musculoskeletal damage.  Differential can include medication, viral infection such as influenza/Covid, trauma, dermatomyositis/polymyositis, severe dehydration, immobility, seizures, or strenuous exercise.  Per patient's wife patient was lying on his side overnight prior to admission on the floor on top of a comforter but would not expect this to cause such a dramatic increase in CK.  Patient is found to be COVID-19 positive which could explain his rhabdomyolysis. -Fluids at 100cc/hour -Continue to monitor renal function and CK -Hold patient's home statin -Continue to monitor  UTI White blood cell count now within normal limits at 8.9.  Was initially 12.2 on admission.  Several day history of dysuria with urine culture showing greater than 100,000 colonies of E faecalis, sensitive to nitrofurantoin.  -Continue nitrofurantoin 100 mg twice daily  Abdominal  pain/left hip contusion Due to complaint of new onset abdominal pain yesterday abdominal CT was ordered.   This showed signs of a lateral abdominal wall hematoma which could be posttraumatic.   There was no sign of fracture to the patient's left hip. A chest CT which was also performed and showed changes compatible with viral pneumonia.  Patient's previous x-ray also shows findings suggestive of left lung contusion.  Patient denies any knowledge of following traumatic incident and upon speaking with his wife she also denies any knowledge of any fall or trauma.  CAD, history of MI Patient with history of STEMI in 2016 per chart review.  Patient also appears to have had myocardial infarction in 2012 per chart review.    Troponins not concerning for MI at this time.  Home medications include lisinopril, Nebivolol, aspirin 81 mg.  Patient also takes Crestor. -Continue home medications. -Hold patient home Crestor  History of stroke Per chart review patient appears to be on Plavix for this.  Plavix does not appear to be on patient's outpatient medication list and patient's wife is unsure of some of his meds. -We will discuss with patient as he becomes back to his baseline.  Back pain/degenerative disc disease Spoke with radiologist yesterday who took a second look at the patient's prior imaging and stated that he did not see any acute fractures but that the patient does have marked degenerative disc disease.  Pain control is limited as we do not wish to use opioids in someone that came in altered who also required oxygen during his hospitalization due to COVID-19, we are still limited in the use of NSAIDs due to contraindications.  His previous x-rays and his hospitalization are negative for signs of fracture I think this is most likely related to a fall he may have had.  He currently denies any knowledge of a fall but with his imaging this seems like a likely possibility.  X-ray of lumbar spine on  presentation shows no acute findings with moderate to severe multilevel disc degenerative disease.  X-ray of thoracic spine shows no acute osseous injury with prior anterior cervical fusion partially visualized from C4-C7. -Provide K pad and  lidocaine patch,  swallows acetaminophen for pain control -Tramadol every 6 hours as needed -We will also add Voltaren gel as topical treatment for his back pain. -PT/OT  Hypertension Most recent blood pressure this morning 135/75.  Normotensive on admission.  Home medications include lisinopril.   -Continue home medications  Hyperlipidemia Home medications include rosuvastatin -Hold patient's home rosuvastatin due to elevated LFTs/rhabdomyolysis  Depression/anxiety Home medications include Wellbutrin.  Patient also has home medication for Xanax, hydroxyzine, Restoril. -Hold patient's Xanax, hydroxyzine, Restoril due to mental status. -We will hold Wellbutrin  BPH Home medications include finasteride -Restart patient's finasteride  FEN/GI: Normal diet PPx: Lovenox 40 twice daily  Disposition: We will likely discharge either home or to SNF upon completion of 5 days of remdesivir as patient continues to improve.   Subjective:  Upon entering the room behind to the patient's nurses I witnessed the patient speaking to the nurses about his back pain in normal tone, not appearing in distress. When I introduced myself and he realized I was in the room he immediately began crying and stating that his back pain was so unbearable he did not know what to do and that he was going to leave AMA if we did not give him something "very strong". I expressed my understanding that the patient  is in pain and that we are somewhat limited in what we can provide him as discussed yesterday. The patient interrupted me stating "you have no idea how much pain I am in... I don't care if it kills me I want something to take away this pain". Upon further discussion with the  patient he does state he feels he is back to his baseline mentally and states that he believes if his wife were talking with him that she would agree he is back to his baseline.  Objective: Temp:  [98 F (36.7 C)-98.5 F (36.9 C)] 98.1 F (36.7 C) (01/08 2027) Pulse Rate:  [56-60] 57 (01/08 2027) Resp:  [14-20] 20 (01/08 2027) BP: (120-136)/(66-123) 135/75 (01/08 2027) SpO2:  [91 %-95 %] 95 % (01/08 2027) Physical Exam: General: Alert and oriented to person, place, time Heart: Regular rate and rhythm with no murmurs appreciated Lungs: CTA bilaterally, no wheezing Abdomen: Bowel sounds present, no abdominal pain, no suprapubic pain palpation Skin: Warm and dry Extremities: No lower extremity edema   Laboratory: Recent Labs  Lab 05/19/19 0521 05/20/19 0345 05/21/19 0500  WBC 10.6* 9.5 8.9  HGB 16.7 15.0 14.5  HCT 49.0 44.6 43.2  PLT 298 304 332   Recent Labs  Lab 05/19/19 0521 05/20/19 0345 05/21/19 0500  NA 136 139 139  K 4.5 4.8 4.0  CL 100 105 106  CO2 24 28 23   BUN 38* 30* 25*  CREATININE 0.74 0.61 0.58*  CALCIUM 8.3* 8.1* 8.1*  PROT 6.1* 5.2* 5.0*  BILITOT 0.8 0.9 0.6  ALKPHOS 44 38 34*  ALT 65* 60* 57*  AST 129* 95* 72*  GLUCOSE 142* 135* 105*      Imaging/Diagnostic Tests:   Lurline Del, DO 05/22/2019, 6:43 AM PGY-1, [place] Intern pager: [phone], text pages welcome

## 2019-05-22 NOTE — Progress Notes (Addendum)
Spoke to patient's wife upon hearing the news that patient was leaving AMA.  Patient's wife was very pleasant and stated that she is very thankful that his status over this hospitalization did not worsen with regard to COVID-19 and that he did not end up needing intubation as she has heard occurs with some patients infected with Covid.  She states that she understands that we are and have been limited in what we can provide for pain control with his lower back pain as he came in altered and has current contraindications to NSAIDs.  I expressed to the patient's wife that I wish I had a better means for controlling his lower back pain but that I am glad he seemed to be improving so well with regard to his COVID-19 infection and his altered mental status compared when he came in.  She again expressed that she was thankful for that as well and that she understands our limitations with pain control.  I expressed to the patient's wife that if he leaves AMA it is very important for him to quarantine per CDC recommendations for at least 10 days from his positive Covid test and 5 days from any symptoms that could be related to COVID-19.  I also expressed that she should also be tested and quarantine as well.  Patient expressed her understanding of this.  I also informed the patient's wife that she should have a very low threshold to call 911 if Mr. Rothman has any change in his breathing status or becomes altered again.  She again expressed understanding of this.

## 2019-05-25 ENCOUNTER — Ambulatory Visit: Payer: Medicare HMO | Admitting: Family Medicine

## 2019-05-26 ENCOUNTER — Emergency Department (HOSPITAL_COMMUNITY): Payer: Medicare HMO

## 2019-05-26 ENCOUNTER — Inpatient Hospital Stay (HOSPITAL_COMMUNITY)
Admission: EM | Admit: 2019-05-26 | Discharge: 2019-06-09 | DRG: 871 | Disposition: A | Discharge status: Skilled Nursing Facility | Payer: Medicare HMO | Attending: Family Medicine | Admitting: Family Medicine

## 2019-05-26 DIAGNOSIS — N401 Enlarged prostate with lower urinary tract symptoms: Secondary | ICD-10-CM | POA: Diagnosis present

## 2019-05-26 DIAGNOSIS — I2510 Atherosclerotic heart disease of native coronary artery without angina pectoris: Secondary | ICD-10-CM | POA: Diagnosis present

## 2019-05-26 DIAGNOSIS — T796XXA Traumatic ischemia of muscle, initial encounter: Secondary | ICD-10-CM | POA: Diagnosis not present

## 2019-05-26 DIAGNOSIS — L8994 Pressure ulcer of unspecified site, stage 4: Secondary | ICD-10-CM | POA: Insufficient documentation

## 2019-05-26 DIAGNOSIS — L8995 Pressure ulcer of unspecified site, unstageable: Secondary | ICD-10-CM

## 2019-05-26 DIAGNOSIS — M6282 Rhabdomyolysis: Secondary | ICD-10-CM | POA: Diagnosis present

## 2019-05-26 DIAGNOSIS — S71102A Unspecified open wound, left thigh, initial encounter: Secondary | ICD-10-CM | POA: Diagnosis not present

## 2019-05-26 DIAGNOSIS — L89203 Pressure ulcer of unspecified hip, stage 3: Secondary | ICD-10-CM | POA: Diagnosis not present

## 2019-05-26 DIAGNOSIS — E860 Dehydration: Secondary | ICD-10-CM | POA: Diagnosis present

## 2019-05-26 DIAGNOSIS — A4189 Other specified sepsis: Principal | ICD-10-CM | POA: Diagnosis present

## 2019-05-26 DIAGNOSIS — G8929 Other chronic pain: Secondary | ICD-10-CM | POA: Diagnosis present

## 2019-05-26 DIAGNOSIS — I252 Old myocardial infarction: Secondary | ICD-10-CM

## 2019-05-26 DIAGNOSIS — M5136 Other intervertebral disc degeneration, lumbar region: Secondary | ICD-10-CM | POA: Diagnosis present

## 2019-05-26 DIAGNOSIS — J1282 Pneumonia due to coronavirus disease 2019: Secondary | ICD-10-CM | POA: Diagnosis present

## 2019-05-26 DIAGNOSIS — E876 Hypokalemia: Secondary | ICD-10-CM | POA: Diagnosis not present

## 2019-05-26 DIAGNOSIS — U071 COVID-19: Secondary | ICD-10-CM | POA: Diagnosis present

## 2019-05-26 DIAGNOSIS — T796XXD Traumatic ischemia of muscle, subsequent encounter: Secondary | ICD-10-CM | POA: Diagnosis not present

## 2019-05-26 DIAGNOSIS — L89220 Pressure ulcer of left hip, unstageable: Secondary | ICD-10-CM | POA: Diagnosis present

## 2019-05-26 DIAGNOSIS — Z951 Presence of aortocoronary bypass graft: Secondary | ICD-10-CM | POA: Diagnosis not present

## 2019-05-26 DIAGNOSIS — Z8673 Personal history of transient ischemic attack (TIA), and cerebral infarction without residual deficits: Secondary | ICD-10-CM

## 2019-05-26 DIAGNOSIS — E7800 Pure hypercholesterolemia, unspecified: Secondary | ICD-10-CM | POA: Diagnosis present

## 2019-05-26 DIAGNOSIS — M79605 Pain in left leg: Secondary | ICD-10-CM | POA: Diagnosis not present

## 2019-05-26 DIAGNOSIS — F0390 Unspecified dementia without behavioral disturbance: Secondary | ICD-10-CM | POA: Diagnosis present

## 2019-05-26 DIAGNOSIS — M62830 Muscle spasm of back: Secondary | ICD-10-CM | POA: Diagnosis present

## 2019-05-26 DIAGNOSIS — Z87440 Personal history of urinary (tract) infections: Secondary | ICD-10-CM

## 2019-05-26 DIAGNOSIS — D6859 Other primary thrombophilia: Secondary | ICD-10-CM | POA: Diagnosis present

## 2019-05-26 DIAGNOSIS — F05 Delirium due to known physiological condition: Secondary | ICD-10-CM | POA: Diagnosis present

## 2019-05-26 DIAGNOSIS — G9341 Metabolic encephalopathy: Secondary | ICD-10-CM | POA: Diagnosis present

## 2019-05-26 DIAGNOSIS — S71002A Unspecified open wound, left hip, initial encounter: Secondary | ICD-10-CM

## 2019-05-26 DIAGNOSIS — Z7982 Long term (current) use of aspirin: Secondary | ICD-10-CM

## 2019-05-26 DIAGNOSIS — F329 Major depressive disorder, single episode, unspecified: Secondary | ICD-10-CM | POA: Diagnosis present

## 2019-05-26 DIAGNOSIS — R4182 Altered mental status, unspecified: Secondary | ICD-10-CM | POA: Diagnosis present

## 2019-05-26 DIAGNOSIS — R338 Other retention of urine: Secondary | ICD-10-CM | POA: Diagnosis present

## 2019-05-26 DIAGNOSIS — Z981 Arthrodesis status: Secondary | ICD-10-CM

## 2019-05-26 DIAGNOSIS — Z85828 Personal history of other malignant neoplasm of skin: Secondary | ICD-10-CM

## 2019-05-26 DIAGNOSIS — M79604 Pain in right leg: Secondary | ICD-10-CM | POA: Diagnosis not present

## 2019-05-26 DIAGNOSIS — I10 Essential (primary) hypertension: Secondary | ICD-10-CM | POA: Diagnosis present

## 2019-05-26 DIAGNOSIS — E785 Hyperlipidemia, unspecified: Secondary | ICD-10-CM | POA: Diagnosis present

## 2019-05-26 DIAGNOSIS — Z79899 Other long term (current) drug therapy: Secondary | ICD-10-CM

## 2019-05-26 DIAGNOSIS — I639 Cerebral infarction, unspecified: Secondary | ICD-10-CM | POA: Diagnosis not present

## 2019-05-26 DIAGNOSIS — Z91041 Radiographic dye allergy status: Secondary | ICD-10-CM

## 2019-05-26 DIAGNOSIS — Z808 Family history of malignant neoplasm of other organs or systems: Secondary | ICD-10-CM

## 2019-05-26 DIAGNOSIS — L89229 Pressure ulcer of left hip, unspecified stage: Secondary | ICD-10-CM | POA: Diagnosis not present

## 2019-05-26 DIAGNOSIS — N179 Acute kidney failure, unspecified: Secondary | ICD-10-CM | POA: Diagnosis present

## 2019-05-26 DIAGNOSIS — F4310 Post-traumatic stress disorder, unspecified: Secondary | ICD-10-CM | POA: Diagnosis present

## 2019-05-26 DIAGNOSIS — Z87891 Personal history of nicotine dependence: Secondary | ICD-10-CM

## 2019-05-26 LAB — CBC WITH DIFFERENTIAL/PLATELET
Abs Immature Granulocytes: 0.25 10*3/uL — ABNORMAL HIGH (ref 0.00–0.07)
Basophils Absolute: 0.1 10*3/uL (ref 0.0–0.1)
Basophils Relative: 0 %
Eosinophils Absolute: 0 10*3/uL (ref 0.0–0.5)
Eosinophils Relative: 0 %
HCT: 47.5 % (ref 39.0–52.0)
Hemoglobin: 15.7 g/dL (ref 13.0–17.0)
Immature Granulocytes: 1 %
Lymphocytes Relative: 2 %
Lymphs Abs: 0.5 10*3/uL — ABNORMAL LOW (ref 0.7–4.0)
MCH: 32.1 pg (ref 26.0–34.0)
MCHC: 33.1 g/dL (ref 30.0–36.0)
MCV: 97.1 fL (ref 80.0–100.0)
Monocytes Absolute: 1.5 10*3/uL — ABNORMAL HIGH (ref 0.1–1.0)
Monocytes Relative: 6 %
Neutro Abs: 21.1 10*3/uL — ABNORMAL HIGH (ref 1.7–7.7)
Neutrophils Relative %: 91 %
Platelets: 511 10*3/uL — ABNORMAL HIGH (ref 150–400)
RBC: 4.89 MIL/uL (ref 4.22–5.81)
RDW: 13 % (ref 11.5–15.5)
WBC: 23.4 10*3/uL — ABNORMAL HIGH (ref 4.0–10.5)
nRBC: 0 % (ref 0.0–0.2)

## 2019-05-26 LAB — URINALYSIS, ROUTINE W REFLEX MICROSCOPIC
Bilirubin Urine: NEGATIVE
Glucose, UA: NEGATIVE mg/dL
Ketones, ur: 5 mg/dL — AB
Leukocytes,Ua: NEGATIVE
Nitrite: NEGATIVE
Protein, ur: 30 mg/dL — AB
Specific Gravity, Urine: 1.023 (ref 1.005–1.030)
pH: 6 (ref 5.0–8.0)

## 2019-05-26 LAB — LACTATE DEHYDROGENASE: LDH: 620 U/L — ABNORMAL HIGH (ref 98–192)

## 2019-05-26 LAB — PROCALCITONIN: Procalcitonin: <0.1 ng/mL

## 2019-05-26 LAB — COMPREHENSIVE METABOLIC PANEL
ALT: 197 U/L — ABNORMAL HIGH (ref 0–44)
AST: 144 U/L — ABNORMAL HIGH (ref 15–41)
Albumin: 2.6 g/dL — ABNORMAL LOW (ref 3.5–5.0)
Alkaline Phosphatase: 60 U/L (ref 38–126)
Anion gap: 12 (ref 5–15)
BUN: 26 mg/dL — ABNORMAL HIGH (ref 8–23)
CO2: 24 mmol/L (ref 22–32)
Calcium: 8.4 mg/dL — ABNORMAL LOW (ref 8.9–10.3)
Chloride: 98 mmol/L (ref 98–111)
Creatinine, Ser: 0.96 mg/dL (ref 0.61–1.24)
GFR calc Af Amer: >60 mL/min (ref >60)
GFR calc non Af Amer: >60 mL/min (ref >60)
Glucose, Bld: 111 mg/dL — ABNORMAL HIGH (ref 70–99)
Potassium: 5.2 mmol/L — ABNORMAL HIGH (ref 3.5–5.1)
Sodium: 134 mmol/L — ABNORMAL LOW (ref 135–145)
Total Bilirubin: 2.2 mg/dL — ABNORMAL HIGH (ref 0.3–1.2)
Total Protein: 6.1 g/dL — ABNORMAL LOW (ref 6.5–8.1)

## 2019-05-26 LAB — HEPATIC FUNCTION PANEL
ALT: 196 U/L — ABNORMAL HIGH (ref 0–44)
AST: 112 U/L — ABNORMAL HIGH (ref 15–41)
Albumin: 2.7 g/dL — ABNORMAL LOW (ref 3.5–5.0)
Alkaline Phosphatase: 70 U/L (ref 38–126)
Bilirubin, Direct: 0.3 mg/dL — ABNORMAL HIGH (ref 0.0–0.2)
Indirect Bilirubin: 0.8 mg/dL (ref 0.3–0.9)
Total Bilirubin: 1.1 mg/dL (ref 0.3–1.2)
Total Protein: 6.4 g/dL — ABNORMAL LOW (ref 6.5–8.1)

## 2019-05-26 LAB — CK: Total CK: 929 U/L — ABNORMAL HIGH (ref 49–397)

## 2019-05-26 LAB — LACTIC ACID, PLASMA
Lactic Acid, Venous: 2.8 mmol/L (ref 0.5–1.9)
Lactic Acid, Venous: 2.5 mmol/L (ref 0.5–1.9)

## 2019-05-26 LAB — FERRITIN: Ferritin: 2427 ng/mL — ABNORMAL HIGH (ref 24–336)

## 2019-05-26 LAB — C-REACTIVE PROTEIN: CRP: 9.4 mg/dL — ABNORMAL HIGH (ref <1.0)

## 2019-05-26 LAB — D-DIMER, QUANTITATIVE: D-Dimer, Quant: 5.31 ug/mL-FEU — ABNORMAL HIGH (ref 0.00–0.50)

## 2019-05-26 LAB — TRIGLYCERIDES: Triglycerides: 112 mg/dL (ref <150)

## 2019-05-26 LAB — FIBRINOGEN: Fibrinogen: 727 mg/dL — ABNORMAL HIGH (ref 210–475)

## 2019-05-26 LAB — TROPONIN I (HIGH SENSITIVITY)
Troponin I (High Sensitivity): 14 ng/L (ref <18)
Troponin I (High Sensitivity): 15 ng/L (ref <18)

## 2019-05-26 MED ORDER — ENOXAPARIN SODIUM 100 MG/ML ~~LOC~~ SOLN
95.0000 mg | Freq: Two times a day (BID) | SUBCUTANEOUS | Status: DC
  Administered 2019-05-27: 95 mg via SUBCUTANEOUS
  Filled 2019-05-26 (×2): qty 0.95
  Filled 2019-05-26: qty 1

## 2019-05-26 MED ORDER — SODIUM CHLORIDE 0.9 % IV BOLUS
1000.0000 mL | Freq: Once | INTRAVENOUS | Status: AC
  Administered 2019-05-26: 18:00:00 1000 mL via INTRAVENOUS

## 2019-05-26 MED ORDER — ENOXAPARIN SODIUM 40 MG/0.4ML ~~LOC~~ SOLN: 40.0000 mg | SUBCUTANEOUS | Status: DC

## 2019-05-26 MED ORDER — SODIUM CHLORIDE 0.9 % IV SOLN
1000.0000 mL | INTRAVENOUS | Status: DC
  Administered 2019-05-26 – 2019-05-29 (×5): 1000 mL via INTRAVENOUS

## 2019-05-26 MED ORDER — SODIUM CHLORIDE 0.9 % IV SOLN
2.0000 g | Freq: Three times a day (TID) | INTRAVENOUS | Status: DC
  Administered 2019-05-26 – 2019-05-29 (×8): 2 g via INTRAVENOUS
  Filled 2019-05-26 (×8): qty 2

## 2019-05-26 MED ORDER — VANCOMYCIN HCL 1250 MG/250ML IV SOLN
1250.0000 mg | Freq: Two times a day (BID) | INTRAVENOUS | Status: DC
  Administered 2019-05-26 – 2019-05-27 (×2): 1250 mg via INTRAVENOUS
  Filled 2019-05-26 (×4): qty 250

## 2019-05-26 MED ORDER — LIDOCAINE 5 % EX PTCH
1.0000 | MEDICATED_PATCH | CUTANEOUS | Status: DC
  Administered 2019-05-27 – 2019-06-08 (×11): 1 via TRANSDERMAL
  Filled 2019-05-26 (×14): qty 1

## 2019-05-26 MED ORDER — SODIUM CHLORIDE 0.9 % IV BOLUS
1000.0000 mL | Freq: Once | INTRAVENOUS | Status: AC
  Administered 2019-05-26: 1000 mL via INTRAVENOUS

## 2019-05-26 MED ORDER — ACETAMINOPHEN 650 MG RE SUPP: 650.0000 mg | Freq: Four times a day (QID) | RECTAL | Status: DC | PRN

## 2019-05-26 MED ORDER — ASPIRIN EC 81 MG PO TBEC
81.0000 mg | DELAYED_RELEASE_TABLET | Freq: Every day | ORAL | Status: DC
  Administered 2019-05-29 – 2019-06-09 (×12): 81 mg via ORAL
  Filled 2019-05-26 (×13): qty 1

## 2019-05-26 MED ORDER — ROSUVASTATIN CALCIUM 5 MG PO TABS: 10.0000 mg | ORAL_TABLET | Freq: Every day | ORAL | Status: DC

## 2019-05-26 MED ORDER — ACETAMINOPHEN 650 MG RE SUPP
650.0000 mg | Freq: Once | RECTAL | Status: AC
  Administered 2019-05-26: 17:00:00 650 mg via RECTAL
  Filled 2019-05-26: qty 1

## 2019-05-26 MED ORDER — METRONIDAZOLE IN NACL 5-0.79 MG/ML-% IV SOLN
500.0000 mg | Freq: Three times a day (TID) | INTRAVENOUS | Status: DC
  Administered 2019-05-26 – 2019-05-29 (×8): 500 mg via INTRAVENOUS
  Filled 2019-05-26 (×8): qty 100
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
## 2019-05-26 NOTE — Progress Notes (Addendum)
Pharmacy Antibiotic Note  Ivan [name] is a 74 y.o. male admitted on 05/26/2019 with cellulitis.  Pharmacy has been consulted for Vancomycin dosing.     Temp (24hrs), Avg:101.2 F (38.4 C), Min:101.2 F (38.4 C), Max:101.2 F (38.4 C)  Recent Labs  Lab 05/20/19 0345 05/21/19 0500 05/26/19 1300 05/26/19 1435  WBC 9.5 8.9 23.4*  --   CREATININE 0.61 0.58* 0.96  --   LATICACIDVEN  --   --  2.8* 2.5*    Estimated Creatinine Clearance: 81.7 mL/min (by C-G formula based on SCr of 0.96 mg/dL).    Allergies  Allergen Reactions  . Contrast Media [Iodinated Diagnostic Agents] Other (See Comments)    Vascular headache    Antimicrobials this admission: 1/13 Cefepime >> 1/13 Flagyl >> 1/13 Vancomcyin >>     Dose adjustments this admission:   Microbiology results: 1/13 BCx: Pending  Plan: - Cefepime 2g IV q8h - No loading dose indicated for wound infection does not appear to be osteo on xray and no gas noted  -  Start Vancomycin 1250 mg IV q12h - Est Calc AUC 563 - Monitor patient's renal function    Also asked to dose Lovenox for r/o PE in this patient CTPE pending till AM 2/2 allergy to contrast. - Lovenox 95mg  subQ q12h  - monitor patients renal fxn, s/s of bleeding, and Platelets   Thank you for allowing pharmacy to be a part of this patient's care.  Duanne Limerick PharmD. BCPS 05/26/2019 6:42 PM

## 2019-05-26 NOTE — ED Triage Notes (Signed)
Pt from home. Tested covid + 05/19/19, was admitted for 4 days and left AMA, wife says he has been laying on the floor since he got home, AMS and weak. Baseline A/O x4.
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
## 2019-05-26 NOTE — Discharge Summary (Addendum)
[facility] Discharge Summary  Patient name: Ivan [name] Medical record number: [id number] Date of birth: 18-Nov-1945 Age: 74 y.o. Gender: male Date of Admission: 05/26/2019  Date of Discharge: 06/09/19 Admitting Physician: Stark Klein, MD  Primary Care Provider: Sherald Hess., MD Consultants: Wound care  Indication for Hospitalization: Altered mental status  Discharge Diagnoses/Problem List:  Active Problems:   AMS (altered mental status)   Pneumonia due to [id number] virus   Metabolic encephalopathy   Delirium due to general medical condition   Traumatic rhabdomyolysis ([place])   Wound, open, hip or thigh with complication, left, initial encounter   Pressure injury of skin  Disposition: discharge to SNF ([place])   Discharge Condition: improved and stable   Discharge Exam:  GEN: elderly male in no acute distress  CV: regular rate and rhythm, no murmurs appreciated  RESP: no increased work of breathing, clear to ascultation bilaterally with no crackles, wheezes, or rhonchi  ABD: Bowel sounds present. Soft, mild suprapubic tenderness , Nondistended.  MSK: no edema, or cyanosis noted SKIN: warm, dry NEURO: grossly normal, moves all extremities appropriately PSYCH: Normal affect and thought content   Left hip wound on 1/25. Appearance prior to discharge.    Brief Hospital Course:  Ivan [name] is a 74 y.o. male admitted for AMS concerning for sepsis and COVID + with worsening pressure injury to left hip. PMH is significant for HTN, HLD, CAD, +COVID-19.  Altered mental status  Sepsis Patient presented to the emergency department after leaving AMA from recent admission 1/5-1/9 for AMS (deemed to have capacity to make his own decisions at that time). Wife reported he had had altered cognition since the evening of 1/9 and that he did not move from the floor for 4 days until brought by EMS.  AMS felt to be multifactorial in the setting  of COVID-19 infection, polypharmacy of Xanax/tramadol at home, and delirium. In the ED, he was febrile to 101.2 F, hypertensive, and tachypneic. Empiric antibiotic treatment including cefepime/vancomycin/flagyl and IVF were started on 1/13, continue to 1/16 when [facility] cultures resulted negative (1 grew coagulase-negative staph, felt to be contaminant).  Procalcitonin <0.10.  CXR improved from recent admit.  Urine culture showed no growth.  Notably had a necrotic left hip pressure ulcer as discussed below, was evaluated by surgery and felt to be noninfectious.  Patient had previously been diagnosed with COVID-19 approximately 9 days prior to this admission. Repeat Covid testing on 1/18 and 1/20 was negative.  At discharge, he was hemodynamically stable on RA and A&O x3.   Covid-19 Infection:  Patient tested positive for Covid-19 on 05/18/2019 after having developed symptoms on 05/15/2019.  Prior to leaving AMA, patient was treated with 4 days of remdesivir and 1 day of IV dexamethasone.  Chest x-ray on this admission was reassuring and patient was stable on room air throughout this admission.  Patient encouraged to isolate 21 days.  Patient was retested for Covid-19 and tested negative on 1/18 and 1/20 prior to being accepted to SNF.   Necrotic left hip pressure ulcer Patient with a previous left hip bruising on last hospitalization and had apparently been lying on the floor on his left side at home since leaving AMA from the hospitalization.  Hip XR without osseous abnormalities.  Due to initial concern for possible infected wound with sepsis, treatment was started including cefepime/vancomycin/flagyl 1/13-1/16. Wound Care was consulted and recommended surgical consult for consideration of debridement. General surgery recommended twice daily  dressing changes with saline moistened gauze and pink pad to soften the eschar to which the wound responded well with no further signs of infection.  Notable  softening/improvement in the region, see picture above.  No surgical debridement was recommended at the time of discharge. Patient to follow up outpatient with wound clinic.   AKI  Acute Urinary Retention  Cr was mildly elevated to 0.96, returned to baseline at d/c with IVF. Patient developed urinary retention and his home finasteride was restarted in addition to the addition of flomax 0.4 and increased to 0.8 for BPH. Patient was able to void spontaneously throughout the remainder of hospitalization.  We recommended the patient continue on Flomax and finasteride as outpatient.   Depression/anxiety: Chronic, stable. Pt on numerous home medications for this including Xanax, hydroxyzine, Restoril. These medications were held due to acute delirium. Wellbutrin was replaced with Duloxetine on 1/20 to also help with pain. We recommend that patient continue this as outpatient.   Back pain/degenerative disc disease: Opioids were avoided during this admission as there was concern for contributing to worsening mental status. Patient was treated with Tylenol, ibuprofen, voltaren gel and K pad.  Tramadol was added as needed for severe pain.   Issues for Follow Up:  1. Recommend outpatient review of [facility]'s list medications with patient's medications due to concern for contribution to changes in mental status.  2. Discontinued Xanax, Atarax, Adderall and Gabapentin as these could have contributed to his delirium. Restart as needed.  3. Recommend patient have mental status evaluation prior to being cleared to go back to work - question of delirium vs underlying dementia.  4. Recommended twice daily dressing changes with saline moistened gauze and pink pad to soften the eschar. Monitor wound for infection, needs to follow up with wound care outpatient.  5. Monitor BP: Norvasc was added to his lisinopril during this stay.  6. Patient with complaints of lower back pain throughout the hospitalization, no acute injury  seen on imaging (CT ab/pelvis 1/6), but with marked disc degeneration, PCP recommend additional follow-up. D/c to SNF, worked with PT/OT while hospitalized with some modest improvements.   Significant Procedures: None  Significant Labs and Imaging:  Recent Labs  Lab 06/03/19 0655 06/04/19 0547 06/06/19 0320  WBC 12.5* 9.0 9.5  HGB 15.6 15.0 15.0  HCT 46.3 44.3 44.3  PLT 410* 375 358   Recent Labs  Lab 06/03/19 0655 06/03/19 0655 06/04/19 0547 06/06/19 0320  NA 137  --  133* 137  K 3.8   < > 3.6 4.5  CL 94*  --  93* 99  CO2 29  --  30 27  GLUCOSE 100*  --  114* 104*  BUN 14  --  15 13  CREATININE 0.63  --  0.58* 0.59*  CALCIUM 8.9  --  8.5* 8.9   < > = values in this interval not displayed.     Chest X-ray 1 View  05/26/19: FINDINGS: There are hazy airspace opacities, most evident in the left upper lobe, the latter approved when compared to the prior radiographs. No new lung abnormalities. Stable changes from CABG surgery. No convincing pleural effusion or pneumothorax. Skeletal structures grossly intact. Stable changes from prior anterior cervical spine fusion. IMPRESSION: 1. Mild improvement from the prior radiographs with decreased airspace opacity in the left upper lobe. No new lung abnormalities.  DG HIP (WITH OR WITHOUT PELVIS) 1View Left 05/26/19  FINDINGS: AP view of the pelvis and AP/frog leg views of the left  hip. Femoral heads are located. Sacroiliac joints are symmetric. No acute fracture. Joint spaces are maintained for age. IMPRESSION: No acute osseous abnormality.  Results/Tests Pending at Time of Discharge:  Unresulted Labs (From admission, onward)    None        Discharge Medications:  Allergies as of 06/09/2019       Reactions   Contrast Media [iodinated Diagnostic Agents] Other (See Comments)   Vascular headache        Medication List     STOP taking these medications    ALPRAZolam 1 MG tablet Commonly known as: XANAX    amphetamine-dextroamphetamine 30 MG tablet Commonly known as: ADDERALL   buPROPion 300 MG 24 hr tablet Commonly known as: WELLBUTRIN XL   Bystolic 20 MG Tabs Generic drug: Nebivolol HCl   gabapentin 300 MG capsule Commonly known as: NEURONTIN   hydrOXYzine 25 MG tablet Commonly known as: ATARAX/VISTARIL   ibuprofen 200 MG tablet Commonly known as: ADVIL   temazepam 15 MG capsule Commonly known as: RESTORIL       TAKE these medications    acetaminophen 500 MG tablet Commonly known as: TYLENOL Take 1,000 mg by mouth every 6 (six) hours as needed for mild pain or headache.   amLODipine 10 MG tablet Commonly known as: NORVASC Take 1 tablet (10 mg total) by mouth daily.   aspirin 81 MG EC tablet Take 1 tablet (81 mg total) by mouth daily.   diclofenac Sodium 1 % Gel Commonly known as: VOLTAREN Apply 2 g topically 4 (four) times daily.   DULoxetine 30 MG capsule Commonly known as: CYMBALTA Take 1 capsule (30 mg total) by mouth daily.   finasteride 5 MG tablet Commonly known as: PROSCAR Take 1 tablet (5 mg total) by mouth daily.   lidocaine 5 % Commonly known as: LIDODERM Place 1 patch onto the skin daily. Remove & Discard patch within 12 hours or as directed by MD   lisinopril 40 MG tablet Commonly known as: ZESTRIL TAKE 1 TABLET BY MOUTH EVERY DAY   magnesium oxide 400 MG tablet Commonly known as: MAG-OX Take 400 mg by mouth daily as needed (muscle cramps).   naproxen 250 MG tablet Commonly known as: NAPROSYN Take 1 tablet (250 mg total) by mouth every 8 (eight) hours as needed for moderate pain.   rosuvastatin 10 MG tablet Commonly known as: CRESTOR Take 1 tablet (10 mg total) by mouth daily.   tadalafil 5 MG tablet Commonly known as: CIALIS TAKE ONE TABLET BY MOUTH DAILY AS NEEDED FOR ERECTILE DYSFUNCTION What changed: See the new instructions.   tamsulosin 0.4 MG Caps capsule Commonly known as: FLOMAX Take 2 capsules (0.8 mg total) by mouth  daily.   traMADol 50 MG tablet Commonly known as: ULTRAM TAKE 1 OR 2 TABLETS EVERY 6 HOURS AS NEEDED NFOR PAIN What changed:   how much to take  how to take this  when to take this  reasons to take this  additional instructions        Discharge Instructions: Please refer to Patient Instructions section of EMR for full details.  Patient was counseled important signs and symptoms that should prompt return to medical care, changes in medications, dietary instructions, activity restrictions, and follow up appointments.   Follow-Up Appointments:  Contact information for follow-up providers     Sherald Hess., MD. Schedule an appointment as soon as possible for a visit.   Specialty: Family Medicine Why: Please follow up with your PCP  within 1 week.  Contact information: [address] [place] [address] [phone]         Nahser, Wonda Cheng, MD .   Specialty: Cardiology Contact information: [place] [id number] [phone]              Contact information for after-discharge care     Destination     [facility] .   Service: Skilled Nursing Contact information: [place], DO PGY-1, [facility] 06/09/2019 3:23 PM    FPTS Upper-Level Resident Addendum    I have discussed the above with the original author and agree with their documentation. My edits were added. Please see also any attending notes.    Patriciaann Clan, DO  Family Medicine PGY-2

## 2019-05-26 NOTE — ED Notes (Signed)
Lactic 2.8

## 2019-05-27 ENCOUNTER — Inpatient Hospital Stay (HOSPITAL_COMMUNITY): Payer: Medicare HMO

## 2019-05-27 DIAGNOSIS — S71102A Unspecified open wound, left thigh, initial encounter: Secondary | ICD-10-CM

## 2019-05-27 DIAGNOSIS — M79604 Pain in right leg: Secondary | ICD-10-CM

## 2019-05-27 DIAGNOSIS — U071 COVID-19: Secondary | ICD-10-CM

## 2019-05-27 DIAGNOSIS — M79605 Pain in left leg: Secondary | ICD-10-CM

## 2019-05-27 DIAGNOSIS — T796XXA Traumatic ischemia of muscle, initial encounter: Secondary | ICD-10-CM

## 2019-05-27 DIAGNOSIS — S71002A Unspecified open wound, left hip, initial encounter: Secondary | ICD-10-CM

## 2019-05-27 DIAGNOSIS — F05 Delirium due to known physiological condition: Secondary | ICD-10-CM

## 2019-05-27 LAB — CBC WITH DIFFERENTIAL/PLATELET
Abs Immature Granulocytes: 0.1 10*3/uL — ABNORMAL HIGH (ref 0.00–0.07)
Basophils Absolute: 0 10*3/uL (ref 0.0–0.1)
Basophils Relative: 0 %
Eosinophils Absolute: 0 10*3/uL (ref 0.0–0.5)
Eosinophils Relative: 0 %
HCT: 44.6 % (ref 39.0–52.0)
Hemoglobin: 14.9 g/dL (ref 13.0–17.0)
Immature Granulocytes: 1 %
Lymphocytes Relative: 4 %
Lymphs Abs: 0.6 10*3/uL — ABNORMAL LOW (ref 0.7–4.0)
MCH: 31.8 pg (ref 26.0–34.0)
MCHC: 33.4 g/dL (ref 30.0–36.0)
MCV: 95.3 fL (ref 80.0–100.0)
Monocytes Absolute: 1.4 10*3/uL — ABNORMAL HIGH (ref 0.1–1.0)
Monocytes Relative: 8 %
Neutro Abs: 14.7 10*3/uL — ABNORMAL HIGH (ref 1.7–7.7)
Neutrophils Relative %: 87 %
Platelets: 399 10*3/uL (ref 150–400)
RBC: 4.68 MIL/uL (ref 4.22–5.81)
RDW: 13 % (ref 11.5–15.5)
WBC: 16.9 10*3/uL — ABNORMAL HIGH (ref 4.0–10.5)
nRBC: 0 % (ref 0.0–0.2)

## 2019-05-27 LAB — COMPREHENSIVE METABOLIC PANEL
ALT: 153 U/L — ABNORMAL HIGH (ref 0–44)
AST: 78 U/L — ABNORMAL HIGH (ref 15–41)
Albumin: 2.4 g/dL — ABNORMAL LOW (ref 3.5–5.0)
Alkaline Phosphatase: 61 U/L (ref 38–126)
Anion gap: 7 (ref 5–15)
BUN: 21 mg/dL (ref 8–23)
CO2: 27 mmol/L (ref 22–32)
Calcium: 8.4 mg/dL — ABNORMAL LOW (ref 8.9–10.3)
Chloride: 103 mmol/L (ref 98–111)
Creatinine, Ser: 0.79 mg/dL (ref 0.61–1.24)
GFR calc Af Amer: >60 mL/min (ref >60)
GFR calc non Af Amer: >60 mL/min (ref >60)
Glucose, Bld: 109 mg/dL — ABNORMAL HIGH (ref 70–99)
Potassium: 3.9 mmol/L (ref 3.5–5.1)
Sodium: 137 mmol/L (ref 135–145)
Total Bilirubin: 1.3 mg/dL — ABNORMAL HIGH (ref 0.3–1.2)
Total Protein: 5.8 g/dL — ABNORMAL LOW (ref 6.5–8.1)

## 2019-05-27 LAB — URINE CULTURE: Culture: NO GROWTH

## 2019-05-27 LAB — RAPID URINE DRUG SCREEN, HOSP PERFORMED
Amphetamines: NOT DETECTED
Barbiturates: NOT DETECTED
Benzodiazepines: POSITIVE — AB
Cocaine: NOT DETECTED
Opiates: NOT DETECTED
Tetrahydrocannabinol: NOT DETECTED

## 2019-05-27 LAB — D-DIMER, QUANTITATIVE: D-Dimer, Quant: 4.29 ug/mL-FEU — ABNORMAL HIGH (ref 0.00–0.50)

## 2019-05-27 LAB — C-REACTIVE PROTEIN: CRP: 9.2 mg/dL — ABNORMAL HIGH (ref <1.0)

## 2019-05-27 LAB — LACTATE DEHYDROGENASE: LDH: 306 U/L — ABNORMAL HIGH (ref 98–192)

## 2019-05-27 MED ORDER — KETOROLAC TROMETHAMINE 15 MG/ML IJ SOLN: 15.0000 mg | Freq: Three times a day (TID) | INTRAMUSCULAR | Status: DC

## 2019-05-27 MED ORDER — ACETAMINOPHEN 650 MG RE SUPP
650.0000 mg | Freq: Four times a day (QID) | RECTAL | Status: DC
  Administered 2019-05-27 – 2019-05-28 (×4): 650 mg via RECTAL
  Filled 2019-05-27 (×4): qty 1

## 2019-05-27 MED ORDER — MORPHINE SULFATE (PF) 2 MG/ML IV SOLN
2.0000 mg | Freq: Once | INTRAVENOUS | Status: AC
  Administered 2019-05-27: 17:00:00 2 mg via INTRAVENOUS
  Filled 2019-05-27: qty 1

## 2019-05-27 MED ORDER — VANCOMYCIN HCL IN DEXTROSE 1-5 GM/200ML-% IV SOLN
1000.0000 mg | Freq: Two times a day (BID) | INTRAVENOUS | Status: DC
  Administered 2019-05-27 – 2019-05-29 (×4): 1000 mg via INTRAVENOUS
  Filled 2019-05-27 (×4): qty 200

## 2019-05-27 MED ORDER — ENOXAPARIN SODIUM 40 MG/0.4ML ~~LOC~~ SOLN
40.0000 mg | SUBCUTANEOUS | Status: DC
  Administered 2019-05-28 – 2019-06-09 (×13): 40 mg via SUBCUTANEOUS
  Filled 2019-05-27 (×13): qty 0.4

## 2019-05-27 NOTE — Evaluation (Signed)
Physical Therapy Evaluation Patient Details Name: Ivan [name] MRN: [id number] DOB: March 20, 1946 Today's Date: 05/27/2019   History of Present Illness   74 y.o. male admitted on 05/26/2019 for worsening wound and AMS. Prior admission (COVID+) with UTI and fall with left hip contusion; pt leaving hospital AMA on 05/22/2019. PMH including HTN, HLD, depression, DDD, CABG x4, PTSD, and CAD.  Clinical Impression  Pt known to therapy from last admission. Pt currently responds to his name however has limited response to questions. Pt exhibits 10/10 pain and requires total A for coming from supine to sit EoB. Pt could only tolerate about 4 minutes of seated with back support before turning back to bed and requiring total A to return to supine. PT recommends SNF level rehab at discharge. PT will continue to follow acutely.      Follow Up Recommendations SNF    Equipment Recommendations  None recommended by PT    Recommendations for Other Services       Precautions / Restrictions Precautions Precautions: Fall Precaution Comments: severe back pain Restrictions Weight Bearing Restrictions: No      Mobility  Bed Mobility Overal bed mobility: Needs Assistance Bed Mobility: Rolling;Sidelying to Sit;Sit to Sidelying Rolling: Total assist;+2 for physical assistance Sidelying to sit: Total assist;+2 for physical assistance     Sit to sidelying: Total assist;+2 for physical assistance General bed mobility comments: Pt requiring Total A +2 for rolling, bringing BLEs over EOB, and then elevating trunk into sitting. Total A to log roll back into bed.   Transfers                 General transfer comment: Defered for safety      Balance Overall balance assessment: Needs assistance Sitting-balance support: Feet unsupported;No upper extremity supported Sitting balance-Leahy Scale: Poor                                       Pertinent Vitals/Pain Pain Assessment:  Faces Faces Pain Scale: Hurts worst Pain Location: back Pain Descriptors / Indicators: Aching;Grimacing;Guarding;Discomfort;Constant Pain Intervention(s): Limited activity within patient's tolerance;Monitored during session;Repositioned    Home Living Family/patient expects to be discharged to:: Private residence Living Arrangements: Spouse/significant other Available Help at Discharge: Family;Available 24 hours/day Type of Home: House Home Access: Stairs to enter   [employer] of Steps: 2 Home Layout: Multi-level;Able to live on main level with bedroom/bathroom Home Equipment: Shower seat Additional Comments: Information collected from prior admission as pt with decreased orientation and cognition; poor historian.    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 Left last admission AMA        Extremity/Trunk Assessment   Upper Extremity Assessment Upper Extremity Assessment: Defer to OT evaluation    Lower Extremity Assessment Lower Extremity Assessment: Difficult to assess due to impaired cognition;RLE deficits/detail;LLE deficits/detail RLE: Unable to fully assess due to pain LLE: Unable to fully assess due to pain    Cervical / Trunk Assessment Cervical / Trunk Assessment: Other exceptions Cervical / Trunk Exceptions: Significant lower back pain  Communication   Communication: No difficulties  Cognition Arousal/Alertness: Awake/alert Behavior During Therapy: Agitated;Anxious;Restless Overall Cognitive Status: Impaired/Different from baseline Area of Impairment: Orientation;Memory;Safety/judgement;Problem solving;Attention;Following commands;Awareness  Orientation Level: Disoriented to;Situation;Time;Place Current Attention Level: Focused Memory: Decreased short-term memory Following Commands: Follows one step  commands inconsistently;Follows one step commands with increased time Safety/Judgement: Decreased awareness of safety;Decreased awareness of deficits   Problem Solving: Slow processing;Requires verbal cues;Requires tactile cues General Comments: Upon arrival, pt attempting to take off mitts. Pt restless and quickly frustrated. Very focused on pain at lower back. When asking him where he is, pt stating, "I dont know."       General Comments General comments (skin integrity, edema, etc.): VSS on RA        Assessment/Plan    PT Assessment Patient needs continued PT services  PT Problem List Decreased strength;Decreased activity tolerance;Decreased balance;Decreased mobility;Decreased cognition;Decreased safety awareness;Pain       PT Treatment Interventions DME instruction;Gait training;Stair training;Functional mobility training;Therapeutic activities;Therapeutic exercise;Balance training;Cognitive remediation;Patient/family education    PT Goals (Current goals can be found in the Care Plan section)  Acute Rehab PT Goals Patient Stated Goal: Stop back pain PT Goal Formulation: Patient unable to participate in goal setting Time For Goal Achievement: 06/10/19 Potential to Achieve Goals: Fair    Frequency Min 2X/week        Co-evaluation PT/OT/SLP Co-Evaluation/Treatment: Yes Reason for Co-Treatment: For patient/therapist safety PT goals addressed during session: Mobility/safety with mobility OT goals addressed during session: ADL's and self-care       AM-PAC PT "6 Clicks" Mobility  Outcome Measure Help needed turning from your back to your side while in a flat bed without using bedrails?: Total Help needed moving from lying on your back to sitting on the side of a flat bed without using bedrails?: Total Help needed moving to and from a bed to a chair (including a wheelchair)?: Total Help needed standing up from a chair using your arms (e.g., wheelchair or bedside chair)?:  Total Help needed to walk in hospital room?: Total Help needed climbing 3-5 steps with a railing? : Total 6 Click Score: 6    End of Session   Activity Tolerance: Patient limited by pain Patient left: in bed;with call bell/phone within reach;with nursing/sitter in room Nurse Communication: Mobility status PT Visit Diagnosis: Other (comment);Muscle weakness (generalized) (M62.81);Difficulty in walking, not elsewhere classified (R26.2);Other abnormalities of gait and mobility (R26.89);Unsteadiness on feet (R26.81);History of falling (Z91.81)    Time: [id number] PT Time Calculation (min) (ACUTE ONLY): 18 min   Charges:   PT Evaluation $PT Eval Moderate Complexity: 1 Mod          Lorraina Spring B. Migdalia Dk PT, DPT Acute Rehabilitation Services Pager [phone] Office [phone]   [place] 05/27/2019, 1:14 PM

## 2019-05-27 NOTE — Progress Notes (Addendum)
Pharmacy Antibiotic Note  Ivan [name] is a 74 y.o. male admitted on 05/26/2019 with cellulitis on left hip. Afebrile, WBC 23.4>16.9, LA 2.8>2.5. Pharmacy has been consulted for Vancomycin dosing.  Plan: - Decrease vancomycin to 1 g IV Q12H (estAUC 547, vd 0.5, Scr 0.8)  - Continue cefepime 2g IV Q8H - Continue flagyl 500 mg IV Q8H - Monitor renal function and clinical improvement - F/u cultures    Temp (24hrs), Avg:98.8 F (37.1 C), Min:97.5 F (36.4 C), Max:101.2 F (38.4 C)  Recent Labs  Lab 05/21/19 0500 05/26/19 1300 05/26/19 1435 05/27/19 0445  WBC 8.9 23.4*  --  16.9*  CREATININE 0.58* 0.96  --  0.79  LATICACIDVEN  --  2.8* 2.5*  --     Estimated Creatinine Clearance: 98 mL/min (by C-G formula based on SCr of 0.79 mg/dL).    Allergies  Allergen Reactions  . Contrast Media [Iodinated Diagnostic Agents] Other (See Comments)    Vascular headache    Antimicrobials this admission: 1/13 Cefepime >> 1/13 Flagyl >> 1/13 Vancomcyin >>    Microbiology results: 1/13 BCx: Pending 1/13 UCx: pending   Lorel Monaco, PharmD PGY1 Ambulatory Care Resident [employer] # [phone]  Thank you for allowing pharmacy to be a part of this patient's care.

## 2019-05-27 NOTE — Plan of Care (Signed)
Pt arrived to unit, soiled and moaning. Bed bath and linen change performed.

## 2019-05-27 NOTE — Consult Note (Signed)
WOC Nurse Consult Note: Patient receiving care in [place]. Patient is COVID +. Consult completed remotely after review of record, including wound image. Reason for Consult: left hip necrotic wound Wound type: UNSTAGEABLE PI Pressure Injury POA: Yes Measurement: To be provided by the bedside RN in the flowsheet section Wound bed:  See photo Drainage (amount, consistency, odor)  Periwound: Dressing procedure/placement/frequency:  THIS WOUND NEEDS A SURGICAL CONSULT FOR POSSIBLE SURGICAL DEBRIDEMENT. TOPICAL WOUND THERAPY IS NOT GOING TO SOLVE THIS PROBLEM. While waiting for surgical consult, perform the following:  Place saline moistened gauze to left hip wound. Cover with ABD pads, tape in place. Monitor the wound area(s) for worsening of condition such as: Signs/symptoms of infection,  Increase in size,  Development of or worsening of odor, Development of pain, or increased pain at the affected locations.  Notify the medical team if any of these develop.  Thank you for the consult.  [place] nurse will not follow at this time.  Please re-consult the [place] team if needed.  Val Riles, RN, MSN, CWOCN, CNS-BC, pager [phone]

## 2019-05-27 NOTE — Evaluation (Signed)
Occupational Therapy Evaluation Patient Details Name: Ivan [name] MRN: [id number] DOB: [birth year] Today's Date: 05/27/2019    History of Present Illness  74 y.o. male admitted on 05/26/2019 for worsening wound and AMS. Prior admission (COVID+) with UTI and fall with left hip contusion; pt leaving hospital AMA on 05/22/2019. PMH including HTN, HLD, depression, DDD, CABG x4, PTSD, and CAD.   Clinical Impression   PTA, pt was living with his wife and chart review stating he was not communicating normally since prior admission; before prior admission pt was independent. Pt currently requiring Max-Total A for ADLs at bed level and Total A +2 for bed mobility. Pt with decreased orientation and cognition requiring Max cues throughout. Pt would benefit from further acute OT to facilitate safe dc. Recommend dc to SNF for further OT to optimize safety, independence with ADLs, and return to PLOF.      Follow Up Recommendations  SNF;Supervision/Assistance - 24 hour    Equipment Recommendations  Other (comment)(Defer to next venue)    Recommendations for Other Services PT consult     Precautions / Restrictions Precautions Precautions: Fall Precaution Comments: severe back pain Restrictions Weight Bearing Restrictions: No      Mobility Bed Mobility Overal bed mobility: Needs Assistance Bed Mobility: Rolling;Sidelying to Sit;Sit to Sidelying Rolling: Total assist;+2 for physical assistance Sidelying to sit: Total assist;+2 for physical assistance     Sit to sidelying: Total assist;+2 for physical assistance General bed mobility comments: Pt requiring Total A +2 for rolling, bringing BLEs over EOB, and then elevating trunk into sitting. Total A to log roll back into bed.   Transfers                 General transfer comment: Defered for safety    Balance Overall balance assessment: Needs assistance Sitting-balance support: Feet unsupported;No upper extremity supported Sitting  balance-Leahy Scale: Poor                                     ADL either performed or assessed with clinical judgement   ADL Overall ADL's : Needs assistance/impaired Eating/Feeding: NPO   Grooming: Moderate assistance;Wash/dry face;Bed level Grooming Details (indicate cue type and reason): Pt requiring Mod hand over hand to bring wash cloth to his face and wash his face. Pt with poor attention to task and quickly stopping task requiring Max cues to wash both eyes and then mouth.  Upper Body Bathing: Maximal assistance;Sitting;Bed level   Lower Body Bathing: Total assistance;Bed level   Upper Body Dressing : Sitting;Maximal assistance;Bed level   Lower Body Dressing: Total assistance;Bed level   Toilet Transfer: Maximal assistance;Total assistance(bed level) Toilet Transfer Details (indicate cue type and reason): Defered transfer due to safety           General ADL Comments: Pt with decreased cogintion impacting his occupational performance and participation. Very focused on lower back pain.     Vision Baseline Vision/History: Wears glasses Wears Glasses: At all times Patient Visual Report: No change from baseline       Perception     Praxis      Pertinent Vitals/Pain Pain Assessment: Faces Faces Pain Scale: Hurts worst Pain Location: back Pain Descriptors / Indicators: Aching;Grimacing;Guarding;Discomfort;Constant Pain Intervention(s): Monitored during session;Limited activity within patient's tolerance;Repositioned     Hand Dominance     Extremity/Trunk Assessment Upper Extremity Assessment Upper Extremity Assessment: Generalized weakness   Lower Extremity Assessment  Lower Extremity Assessment: Defer to PT evaluation   Cervical / Trunk Assessment Cervical / Trunk Assessment: Other exceptions Cervical / Trunk Exceptions: Significant lower back pain   Communication Communication Communication: No difficulties   Cognition Arousal/Alertness:  Awake/alert Behavior During Therapy: Agitated;Anxious;Restless Overall Cognitive Status: Impaired/Different from baseline Area of Impairment: Orientation;Memory;Safety/judgement;Problem solving;Attention;Following commands;Awareness                 Orientation Level: Disoriented to;Situation;Time;Place Current Attention Level: Focused Memory: Decreased short-term memory Following Commands: Follows one step commands inconsistently;Follows one step commands with increased time Safety/Judgement: Decreased awareness of safety;Decreased awareness of deficits   Problem Solving: Slow processing;Requires verbal cues;Requires tactile cues General Comments: Upon arrival, pt attempting to take off mitts. Pt restless and quickly frustrated. Very focused on pain at lower back. When asking him where he is, pt stating, "I dont know."    General Comments  VSS on RA    Exercises     Shoulder Instructions      Home Living Family/patient expects to be discharged to:: Private residence Living Arrangements: Spouse/significant other Available Help at Discharge: Family;Available 24 hours/day Type of Home: House Home Access: Stairs to enter [employer] of Steps: 2   Home Layout: Multi-level;Able to live on main level with bedroom/bathroom     Bathroom Shower/Tub: Teacher, early years/pre: Standard     Home Equipment: Shower seat   Additional Comments: Information collected from prior admission as pt with decreased orientation and cognition; poor historian.      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 Left last admission AMA        OT Problem List: Decreased strength;Decreased knowledge of use of DME or AE;Decreased activity tolerance;Decreased cognition;Impaired balance (sitting and/or standing);Decreased safety  awareness;Pain      OT Treatment/Interventions: Self-care/ADL training;Therapeutic exercise;Patient/family education;Balance training;Energy conservation;Therapeutic activities;DME and/or AE instruction;Cognitive remediation/compensation    OT Goals(Current goals can be found in the care plan section) Acute Rehab OT Goals Patient Stated Goal: Stop back pain OT Goal Formulation: With patient Time For Goal Achievement: 06/04/19 Potential to Achieve Goals: Good  OT Frequency: Min 2X/week   Barriers to D/C:            Co-evaluation PT/OT/SLP Co-Evaluation/Treatment: Yes Reason for Co-Treatment: For patient/therapist safety;To address functional/ADL transfers   OT goals addressed during session: ADL's and self-care      AM-PAC OT "6 Clicks" Daily Activity     Outcome Measure Help from another person eating meals?: Total Help from another person taking care of personal grooming?: A Lot Help from another person toileting, which includes using toliet, bedpan, or urinal?: Total Help from another person bathing (including washing, rinsing, drying)?: A Lot Help from another person to put on and taking off regular upper body clothing?: A Lot Help from another person to put on and taking off regular lower body clothing?: Total 6 Click Score: 9   End of Session Nurse Communication: Mobility status  Activity Tolerance: Patient limited by pain Patient left: with call bell/phone within reach;in bed;with bed alarm set;with restraints reapplied  OT Visit Diagnosis: Unsteadiness on feet (R26.81);Other abnormalities of gait and mobility (R26.89);Muscle weakness (generalized) (M62.81);Other symptoms and signs involving cognitive function;Pain Pain - part of body: (Lower back)                Time: [id number] OT Time  Calculation (min): 18 min Charges:  OT General Charges $OT Visit: 1 Visit OT Evaluation $OT Eval Moderate Complexity: [place], OTR/L Acute Rehab Pager:  [phone] Office: [place] 05/27/2019, 12:36 PM

## 2019-05-27 NOTE — Plan of Care (Signed)
Unable to complete admission assessment pt lethargic. However complains of pain when stimulated.

## 2019-05-27 NOTE — Plan of Care (Signed)
Head to toe skin assessment complete. Pt with left hip pressure injury necrotic foaming dressing applied. Also noted left torsos pressure injury foam dressing applied. No additional skin break down noted at this time. Will turn q2h.
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
## 2019-05-27 NOTE — Consult Note (Signed)
   [facility] CM Inpatient Consult   05/27/2019  Ivan [name] 1945-08-16 [id number]   Patient screened for unplanned readmission with less than 7 days re-hospitalizations listed in [facility] HMO with [place].  Patient readmitted with worsening symptoms  after leaving the hospital 05/22/2019 Against Medical Advise, with COVID and left thigh/hip wound.  Review of patient's medical record reveals patient is readmitted with altered mental status in the setting of COVID-19.  Primary Care Provider is listed as Sherald Hess, MD, **this provider is with [facility] which is not a [facility] provider, in the [employer].   Plan:  Follow up on if patient's primary care provider is as above with inpatient Transition Of Care team, if possible.  For questions contact:   Natividad Brood, RN BSN [facility] Liaison  [phone] business mobile phone Toll free office [phone]  Fax number: [phone] [email or web address]

## 2019-05-27 NOTE — Progress Notes (Signed)
Called and updated wife, Neoma Laming, after she called and requested per RN.  Discussed with his wife that he is quite complex and will require several more days of hospitalization/weeks of rehab if all is to go well, however reassured her that he has had a mild improvement in his mental status today after fluid resuscitation with antibiotic coverage.  Given his necrotic pressure wound, discussed that surgery would be evaluating and recommend necessity for debridement as appropriate.  She understands and is appreciative of his care.  Patriciaann Clan, DO

## 2019-05-27 NOTE — Progress Notes (Signed)
Family Medicine Teaching Service Daily Progress Note Intern Pager: [phone]  Patient name: Ivan [name] Medical record number: [id number] Date of birth: 1945/10/14 Age: 74 y.o. Gender: male  Primary Care Provider: Sherald Hess., MD Consultants: Wound care, general surgery Code Status: Full code  Pt Overview and Major Events to Date:  1/13: Patient admitted for altered mental status and worsening pressure injury to left hip, started on vancomycin and cefepime, Lovenox at PE treatment level  Assessment and Plan: Ivan [name] is a 74 y.o. male admitted for AMS concerning for sepsis and COVID + with worsening pressure injury to left hip. PMH is significant for HTN, HLD, CAD, +COVID-19.  AMS  Sepsis  Patient with improved mental status this morning.  Patient oriented to self, and location but unable to state city, country or year.  Patient complains that his "brain hurts".  WBC count imporved from 23.4 to 16.9 this AM. Unsure if this is a headache as patient later denies this.  Patient continues to be in mitts and confused. LDH improved to 306 from 620  -Patient on vancomycin and cefepime -admit to inpatient, attending Dr. Andria Frames  -fluid bolus x2  -mIVF with NS @ 135mL/hr  -continue Vanc, Cefe and will consider discontinuing once cultures neg for 48 hours and patient has remained afebrile  -cardiac monitoring  -continuous pulse ox -UDS -PT/OT to evaluate -Follow-up blood cultures, NGTD  -Neurochecks -continue abx for 48 afebrile and negative cultures   Necrotic Left Hip Pressure Ulcer  Patient previously had left hip wound and since leaving from prior admission and been lying on wound applying pressure and unable to even distribute pressure while also likely lying in urine, per wife. Wound appears to have necrotic covering and is concerning for underlying infection contributing to sepsis. Patient febrile to 101 and with leukocytosis.  Hip XR without acute osseous  abnormality. -Vancomycin per pharmacy  -cefepime and metronidazole 500 mg IV -wound care consult, recommend surgical consult  -consult surgery  -Tylenol PRN   Elevated D-dimer  Patient with +COVID, elevated d-dimer of 5>4.2 today.  Do have some underlying clinical concern for PE given his current hypercoagulable state while being completely sedentary enough to develop significant pressure wound changes over the last several days with additional elevation in his D-dimer compared to previous down trending in last admission.  Patient continues to be stable on RA.  Unfortunately with his contrast dye allergy he would need to be premedicated with steroids/antihistamine prior to his CTA, hesitant to do this given his current mental state.   -consider CTA in relation to allergies and need for pre-treatment -will continue treatment dose of lovenox for now  -LE dopplers  -trend d-dimers  -monitor respiratory status   COVID + Patient positive for COVID -19 on 05/18/19, however approximately started developing symptoms on 1/2.  Received approximately 4 days of remdesivir and 1 day of IV dexamethasone in his recent admission. He should isolate for 21 days and will follow these guidelines when preparing for disposition.  CRP elevated 9.4>9.2, Ferritin elevated 2427 from 1385 prior to previous discharge.  Patient is saturating 94% on RA with RR in normal range. Patient with normal RR and denies chest pain with breathing but unreliable given waxing and waning mentation.  -continue air borne and droplet precautions  -Follow D-dimer, CRP, LDH  AKI Cr elevated at 0.96, improved to 0.79 this AM BL appears to be normal, last 0.5-0.7 from previous admission. Likely due to decreased PO intake during  worsening illness.  -continue MIVF as patient is NPO -monitor CMP   Transaminitis  AST elevated at 112 and ALT elevated at 196, improved this morning at 78 and 153 respectively.   This is likely 2/2 to COVID  infection/sepsis.  -monitor LFT -Fluid resuscitation  Elevated CK  CK elevated at 929, greatly improved from >8,000 during previous admission which was felt to be in the setting of fall with AMS at home vs contributory ?Covid rhabdomyolysis.   -continue to monitor  -patient s/p 2 boluses  -mIVF @ 19mL/hr   CAD, previous MI: Chronic, stable. Previous STEMI in 2016 and MI in 2012 per chart review.  He is currently managed on lisinopril, nebivolol, ?aspirin vs plavix, and Crestor at home. Reassuringly high sensitivity troponins 14 and 15 with EKG similar to previous. -Holding home medications as above due to n.p.o. status for AMS, will restart as appropriate  Previous CVA: Chronic, stable. No known residual deficits.  Per chart review appears to be on Plavix, however was not on his outpatient medication list and his wife has been unclear about his medications.  We were not able to clarify this on his last hospitalization, will discuss as his mental status improves.  Recent brain MRI on 1/5 without acute intracranial abnormalities. -Holding home medications due to n.p.o. status as discussed above  Hypertension: Chronic, stable. Variable SBP, averaging SBP 130s since admission.  Takes lisinopril and nebivolol at home. -Holding home medications as discussed above  Hyperlipidemia: Chronic, stable. Takes rosuvastatin at home for secondary prevention. -Hold home Crestor given mild transaminitis  Depression/anxiety: Chronic, stable. He is on numerous medications for this including Wellbutrin, Xanax, hydroxyzine, Restoril.  Per reviewing PMP aware, he last received 60 tablets of Xanax in 01/2019 and 30 tablets of Restoril in 12/2018.  Do have some concern for polypharmacy contributing to his altered mental status. -Hold home medications above, low concern for withdrawal as he should not have enough of these medications from previous prescriptions to be taking on a consistent basis long-term  unless he is received these from alternative sources  Back pain/degenerative disc disease: Known severe disease which I suspect was exacerbated after presumed recent fall prior to last admission, recently left AMA due to perceived inadequate pain control. He currently is quite altered and thus will avoid opioids at this time, additionally will avoid NSAIDs given current COVID.  Placed order for lidocaine patch and K pad to help in the interim.  As his mental status improves, will add on Tylenol, gabapentin, and likely tramadol (does have an active prescription for this currently at home).   BPH: Finasteride at home, will hold while n.p.o.   FEN/GI: N.p.o. PPx: Lovenox per pharmacy  Disposition: discharge pending resolution of AMS and sepsis  Subjective:  Patient still appears confused, states that his "brain hurts" but denies having a headache when asked later.  Patient denies any difficulty with breathing and he is oriented to self and location but unsure of why he is in the hospital.  Patient unable to relay history leading to his hospitalization.  Objective: Temp:  [97.5 F (36.4 C)-101.2 F (38.4 C)] 98.3 F (36.8 C) (01/14 0515) Pulse Rate:  [63-85] 85 (01/14 0730) Resp:  [18-36] 18 (01/14 0730) BP: (106-192)/(36-143) 158/90 (01/14 0515) SpO2:  [94 %-99 %] 94 % (01/14 0730)  Physical Exam: General: Male appearing stated age, disoriented, moving in bed frequently trying to remove pain mittens Cardiovascular: Rate and rhythm with normal S1 and S2, no friction rubs or  gallops Respiratory: Decreased breath sounds throughout, no crackles no wheezing, patient stable on room air Abdomen: Nondistended, apparent to have no tenderness to palpation, bowel sounds appreciated Extremities: No lower extremity edema  Laboratory: Recent Labs  Lab 05/21/19 0500 05/26/19 1300 05/27/19 0445  WBC 8.9 23.4* 16.9*  HGB 14.5 15.7 14.9  HCT 43.2 47.5 44.6  PLT 332 511* 399   Recent Labs   Lab 05/21/19 0500 05/21/19 0500 05/26/19 1300 05/26/19 1553 05/27/19 0445  NA 139  --  134*  --  137  K 4.0  --  5.2*  --  3.9  CL 106  --  98  --  103  CO2 23  --  24  --  27  BUN 25*  --  26*  --  21  CREATININE 0.58*  --  0.96  --  0.79  CALCIUM 8.1*  --  8.4*  --  8.4*  PROT 5.0*   < > 6.1* 6.4* 5.8*  BILITOT 0.6   < > 2.2* 1.1 1.3*  ALKPHOS 34*   < > 60 70 61  ALT 57*   < > 197* 196* 153*  AST 72*   < > 144* 112* 78*  GLUCOSE 105*  --  111*  --  109*   < > = values in this interval not displayed.    Imaging/Diagnostic Tests: DG Chest Port 1 View  Result Date: 05/26/2019 CLINICAL DATA:  Pt from home. Tested covid + 05/19/19, was admitted for 4 days and left AMA, wife says he has been laying on the floor since he got home, AMS and weak. EXAM: PORTABLE CHEST 1 VIEW COMPARISON:  05/18/2019.  CT, 05/19/2019. FINDINGS: There are hazy airspace opacities, most evident in the left upper lobe, the latter approved when compared to the prior radiographs. No new lung abnormalities. Stable changes from CABG surgery. No convincing pleural effusion or pneumothorax. Skeletal structures grossly intact. Stable changes from prior anterior cervical spine fusion. IMPRESSION: 1. Mild improvement from the prior radiographs with decreased airspace opacity in the left upper lobe. No new lung abnormalities. Electronically Signed   By: Lajean Manes M.D.   On: 05/26/2019 13:03   DG Hip Unilat W or Wo Pelvis 1 View Left  Result Date: 05/26/2019 CLINICAL DATA:  Pain.  No known injury. EXAM: DG HIP (WITH OR WITHOUT PELVIS) 1V*L* COMPARISON:  None. FINDINGS: AP view of the pelvis and AP/frog leg views of the left hip. Femoral heads are located. Sacroiliac joints are symmetric. No acute fracture. Joint spaces are maintained for age. IMPRESSION: No acute osseous abnormality. Electronically Signed   By: Abigail Miyamoto M.D.   On: 05/26/2019 15:36     Stark Klein, MD 05/27/2019, 12:01 PM PGY-1, [place] Intern pager: [phone], text pages welcome

## 2019-05-27 NOTE — Plan of Care (Signed)
  Problem: Education: Goal: Knowledge of risk factors and measures for prevention of condition will improve Outcome: Progressing   

## 2019-05-28 ENCOUNTER — Encounter (HOSPITAL_COMMUNITY): Payer: Self-pay | Admitting: Family Medicine

## 2019-05-28 ENCOUNTER — Other Ambulatory Visit: Payer: Self-pay

## 2019-05-28 DIAGNOSIS — T796XXD Traumatic ischemia of muscle, subsequent encounter: Secondary | ICD-10-CM

## 2019-05-28 DIAGNOSIS — L8994 Pressure ulcer of unspecified site, stage 4: Secondary | ICD-10-CM | POA: Insufficient documentation

## 2019-05-28 LAB — CBC WITH DIFFERENTIAL/PLATELET
Abs Immature Granulocytes: 0.08 10*3/uL — ABNORMAL HIGH (ref 0.00–0.07)
Basophils Absolute: 0 10*3/uL (ref 0.0–0.1)
Basophils Relative: 0 %
Eosinophils Absolute: 0 10*3/uL (ref 0.0–0.5)
Eosinophils Relative: 0 %
HCT: 43.4 % (ref 39.0–52.0)
Hemoglobin: 14.8 g/dL (ref 13.0–17.0)
Immature Granulocytes: 1 %
Lymphocytes Relative: 5 %
Lymphs Abs: 0.7 10*3/uL (ref 0.7–4.0)
MCH: 32 pg (ref 26.0–34.0)
MCHC: 34.1 g/dL (ref 30.0–36.0)
MCV: 93.9 fL (ref 80.0–100.0)
Monocytes Absolute: 1.2 10*3/uL — ABNORMAL HIGH (ref 0.1–1.0)
Monocytes Relative: 8 %
Neutro Abs: 12.6 10*3/uL — ABNORMAL HIGH (ref 1.7–7.7)
Neutrophils Relative %: 86 %
Platelets: 354 10*3/uL (ref 150–400)
RBC: 4.62 MIL/uL (ref 4.22–5.81)
RDW: 12.8 % (ref 11.5–15.5)
WBC: 14.6 10*3/uL — ABNORMAL HIGH (ref 4.0–10.5)
nRBC: 0 % (ref 0.0–0.2)

## 2019-05-28 LAB — COMPREHENSIVE METABOLIC PANEL
ALT: 116 U/L — ABNORMAL HIGH (ref 0–44)
AST: 48 U/L — ABNORMAL HIGH (ref 15–41)
Albumin: 2.3 g/dL — ABNORMAL LOW (ref 3.5–5.0)
Alkaline Phosphatase: 55 U/L (ref 38–126)
Anion gap: 8 (ref 5–15)
BUN: 16 mg/dL (ref 8–23)
CO2: 26 mmol/L (ref 22–32)
Calcium: 8.1 mg/dL — ABNORMAL LOW (ref 8.9–10.3)
Chloride: 100 mmol/L (ref 98–111)
Creatinine, Ser: 0.68 mg/dL (ref 0.61–1.24)
GFR calc Af Amer: >60 mL/min (ref >60)
GFR calc non Af Amer: >60 mL/min (ref >60)
Glucose, Bld: 111 mg/dL — ABNORMAL HIGH (ref 70–99)
Potassium: 3.2 mmol/L — ABNORMAL LOW (ref 3.5–5.1)
Sodium: 134 mmol/L — ABNORMAL LOW (ref 135–145)
Total Bilirubin: 1.1 mg/dL (ref 0.3–1.2)
Total Protein: 5.6 g/dL — ABNORMAL LOW (ref 6.5–8.1)

## 2019-05-28 LAB — D-DIMER, QUANTITATIVE: D-Dimer, Quant: 3.76 ug/mL-FEU — ABNORMAL HIGH (ref 0.00–0.50)

## 2019-05-28 MED ORDER — KETOROLAC TROMETHAMINE 15 MG/ML IJ SOLN
15.0000 mg | Freq: Four times a day (QID) | INTRAMUSCULAR | Status: DC | PRN
  Administered 2019-05-28 – 2019-05-29 (×3): 15 mg via INTRAVENOUS
  Filled 2019-05-28 (×3): qty 1

## 2019-05-28 MED ORDER — TAMSULOSIN HCL 0.4 MG PO CAPS
0.4000 mg | ORAL_CAPSULE | Freq: Every day | ORAL | Status: DC
  Administered 2019-05-28 – 2019-05-29 (×2): 0.4 mg via ORAL
  Filled 2019-05-28 (×2): qty 1

## 2019-05-28 MED ORDER — ACETAMINOPHEN 325 MG PO TABS: 650.0000 mg | ORAL_TABLET | Freq: Four times a day (QID) | ORAL | Status: DC | PRN

## 2019-05-28 MED ORDER — LISINOPRIL 40 MG PO TABS
40.0000 mg | ORAL_TABLET | Freq: Every day | ORAL | Status: DC
  Administered 2019-05-28 – 2019-06-09 (×13): 40 mg via ORAL
  Filled 2019-05-28 (×13): qty 1

## 2019-05-28 MED ORDER — POTASSIUM CHLORIDE 10 MEQ/100ML IV SOLN
10.0000 meq | INTRAVENOUS | Status: AC
  Administered 2019-05-28 (×3): 10 meq via INTRAVENOUS
  Filled 2019-05-28 (×3): qty 100

## 2019-05-28 NOTE — Progress Notes (Signed)
Updated patient's wife on patient's condition.
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
## 2019-05-28 NOTE — Progress Notes (Signed)
Patient complaining of bladder pain. Bladder scan volume 478. [facility]. Awaiting order

## 2019-05-28 NOTE — Progress Notes (Signed)
PHARMACY - PHYSICIAN COMMUNICATION CRITICAL VALUE ALERT - BLOOD CULTURE IDENTIFICATION (BCID)  Ivan [name] is an 74 y.o. male who presented to [facility] on 05/26/2019   Assessment:  1 of 2 sets of blood cultures is growing GPC in the anaerobic bottle.  Possible contamination - will await identification.  Name of physician (or Provider) Contacted: [facility]PTS oncall MD paged  Current antibiotics: vancomycin, cefepime, metronidazole  Changes to prescribed antibiotics recommended: None based on this culture   No results found for this or any previous visit.  [name] 05/28/2019  12:02 PM

## 2019-05-28 NOTE — Progress Notes (Signed)
Family Medicine Teaching Service Daily Progress Note Intern Pager: [phone]  Patient name: Ivan [name] Medical record number: [id number] Date of birth: 31-May-1945 Age: 74 y.o. Gender: male  Primary Care Provider: Sherald Hess., MD Consultants: Wound care, general surgery Code Status: Full code  Pt Overview and Major Events to Date:  1/13: Patient admitted for altered mental status and worsening pressure injury to left hip, started on vancomycin and cefepime, Lovenox at PE treatment level  Assessment and Plan: Ivan [name] is a 74 y.o. male admitted for AMS concerning for sepsis and COVID + with worsening pressure injury to left hip. PMH is significant for HTN, HLD, CAD, +COVID-19.  AMS  Sepsis   Patient oriented to self, and location,city, state and year but not to president. Patient's mental status has improved today however patient still with some confusion when communicating. WBC count improved to 14 from 16.  Blood cultures negative for 24 hours so far and  patient has been afebrile since afternoon of 05/26/19.  -Patient on vancomycin and cefepime and flagyll since 1/13, will discontinue once cultures have been negative for 48 hours and if patient remains afebrile  -mIVF with NS @ 133mL/hr   -cardiac monitoring  -continuous pulse ox -PT/OT : recommending SNF -Follow-up blood cultures, NGTD  -Neurochecks  Necrotic Left Hip Pressure Ulcer  Patient evaluated by surgery for consideration of debridement in the OR.  -will discontinue Vancomycin per pharmacy  -will discontinue cefepime and metronidazole 500 mg IV -Surgery following, plan to soften eschar and consider OR for debridement  -Tylenol suppository PRN -Toradol 15mg  q6PRN for 05/28/19   Hypokalemia  Potassium decreased to 3.2 -replenished  -will monitor with BMP   Elevated D-dimer  Patient with +COVID, elevated d-dimer of 5>4.2>3.76 today.  Patient continues to be stable on RA.  Unfortunately with his  contrast dye allergy he would need to be premedicated with steroids/antihistamine prior to his CTA, hesitant to do this given his current mental state.   -continue ppx lovenox and monitor clinical status for change in respiratory status -LE dopplers negative for DVT as of 05/27/19  -trend d-dimers, down trending appropriately  -monitor respiratory status   COVID + Patient positive for COVID -19 on 05/18/19, however approximately started developing symptoms on 1/2.  Received approximately 4 days of remdesivir and 1 day of IV dexamethasone in his recent admission. He should isolate for 21 days and will follow these guidelines when preparing for disposition.  Patient is saturating 94% on RA with RR in normal range. Patient with normal RR and denies chest pain with breathing but unreliable given waxing and waning mentation.  -continue air borne and droplet precautions  -Follow D-dimer, CRP, LDH  AKI Cr was elevated at 0.79, improved to 0.68 this AM BL appears to be normal, last 0.5-0.7 from previous admission. Likely due to decreased PO intake during worsening illness.  -continue MIVF as patient is NPO -monitor CMP   Transaminitis  AST elevated at 112 and ALT elevated at 196, improved this morning at 48 and 114 respectively.   This is likely 2/2 to COVID infection/sepsis.  -monitor LFT -Fluid resuscitation  Elevated CK  CK elevated at 929, greatly improved from >8,000 during previous admission which was felt to be in the setting of fall with AMS at home vs contributory ?Covid rhabdomyolysis.   -continue to monitor  -mIVF @ 149mL/hr   CAD, previous MI: Chronic, stable. Previous STEMI in 2016 and MI in 2012 per chart review.  He is currently managed on lisinopril, nebivolol, ?aspirin vs plavix, and Crestor at home.  -Holding home medications as above due to n.p.o. status for AMS, will restart as appropriate  Previous CVA: Chronic, stable. No known residual deficits.  Per chart review  appears to be on Plavix, however was not on his outpatient medication list and his wife has been unclear about his medications.  We were not able to clarify this on his last hospitalization, will discuss as his mental status improves.  Recent brain MRI on 1/5 without acute intracranial abnormalities. -Holding home medications due to n.p.o. status as discussed above  Hypertension: Chronic, stable. BP consistently elevated.  Takes lisinopril and nebivolol at home. -pending safe swallow study, will restart home meds for BP  -Holding home medications as discussed above  Hyperlipidemia: Chronic, stable. Takes rosuvastatin at home for secondary prevention. -Hold home Crestor given mild transaminitis  Depression/anxiety: Chronic, stable. He is on numerous medications for this including Wellbutrin, Xanax, hydroxyzine, Restoril.  Per reviewing PMP aware, he last received 60 tablets of Xanax in 01/2019 and 30 tablets of Restoril in 12/2018.  Do have some concern for polypharmacy contributing to his altered mental status. -Hold home medications above, low concern for withdrawal as he should not have enough of these medications from previous prescriptions to be taking on a consistent basis long-term unless he is received these from alternative sources  Back pain/degenerative disc disease: Known severe disease which I suspect was exacerbated after presumed recent fall prior to last admission, recently left AMA due to perceived inadequate pain control. He currently is quite altered and thus will avoid opioids at this time, additionally will avoid NSAIDs given current COVID.    lidocaine patch and K pad to help in the interim.   - Tylenol -will add  gabapentin, and likely tramadol (does have an active prescription for this currently at home) as mental status improves and safe swallow study   BPH: Finasteride at home, will hold while n.p.o. -would like to restart as soon as can advance diet to  PO -patient had in and out cath for retention overnight  FEN/GI: N.p.o., pending swallow study  PPx: Lovenox per pharmacy  Disposition: discharge pending resolution of AMS and sepsis  Subjective:  Patient denies HA, chest pain but reports back pain rated as 10/10. Pain is sharp and in the middle of back, patient is uncertain of what brought him into hospital but thinks he may have fallen.   Objective: Temp:  [97.9 F (36.6 C)-99.4 F (37.4 C)] 99.2 F (37.3 C) (01/15 0800) Pulse Rate:  [63-81] 72 (01/15 0800) Resp:  [13-20] 18 (01/15 0800) BP: (154-176)/(67-92) 176/84 (01/15 0800) SpO2:  [92 %-97 %] 96 % (01/15 0800) Weight:  [95 kg] 95 kg (01/15 0501)  Physical Exam: General: male appearing stated age, lying calmly in bed  Cardiovascular: RRR without murmurs, gallops or friction rubs  Respiratory: Decreased breath sounds throughout, do not appreciate crackles or wheezing  Abdomen: abdomen softer than previous exam, NT to palpation, minimal bowel sounds appreciated  Extremities: No lower extremity edema Neuro: alert, oriented to person, location, city and state and year, not to president, follows commands but still confused intermittently during conversation  Laboratory: Recent Labs  Lab 05/26/19 1300 05/27/19 0445 05/28/19 0309  WBC 23.4* 16.9* 14.6*  HGB 15.7 14.9 14.8  HCT 47.5 44.6 43.4  PLT 511* 399 354   Recent Labs  Lab 05/26/19 1300 05/26/19 1300 05/26/19 1553 05/27/19 0445 05/28/19 0309  NA  134*  --   --  137 134*  K 5.2*  --   --  3.9 3.2*  CL 98  --   --  103 100  CO2 24  --   --  27 26  BUN 26*  --   --  21 16  CREATININE 0.96  --   --  0.79 0.68  CALCIUM 8.4*  --   --  8.4* 8.1*  PROT 6.1*   < > 6.4* 5.8* 5.6*  BILITOT 2.2*   < > 1.1 1.3* 1.1  ALKPHOS 60   < > 70 61 55  ALT 197*   < > 196* 153* 116*  AST 144*   < > 112* 78* 48*  GLUCOSE 111*  --   --  109* 111*   < > = values in this interval not displayed.    Imaging/Diagnostic Tests: VAS  [place] LOWER EXTREMITY VENOUS (DVT)  Result Date: 05/27/2019  Lower Venous Study Indications: Covid-19, immobility.  Comparison Study: No prior study on file for comparison Performing Technologist: Sharion Dove RVS  Examination Guidelines: A complete evaluation includes B-mode imaging, spectral Doppler, color Doppler, and power Doppler as needed of all accessible portions of each vessel. Bilateral testing is considered an integral part of a complete examination. Limited examinations for reoccurring indications may be performed as noted.  +---------+---------------+---------+-----------+----------+--------------+ RIGHT    CompressibilityPhasicitySpontaneityPropertiesThrombus Aging +---------+---------------+---------+-----------+----------+--------------+ CFV      Full           Yes      Yes                                 +---------+---------------+---------+-----------+----------+--------------+ SFJ      Full                                                        +---------+---------------+---------+-----------+----------+--------------+ FV Prox  Full                                                        +---------+---------------+---------+-----------+----------+--------------+ FV Mid   Full                                                        +---------+---------------+---------+-----------+----------+--------------+ FV DistalFull                                                        +---------+---------------+---------+-----------+----------+--------------+ PFV      Full                                                        +---------+---------------+---------+-----------+----------+--------------+  POP      Full           Yes      Yes                                 +---------+---------------+---------+-----------+----------+--------------+ PTV      Full                                                         +---------+---------------+---------+-----------+----------+--------------+ PERO     Full                                                        +---------+---------------+---------+-----------+----------+--------------+   +---------+---------------+---------+-----------+----------+--------------+ LEFT     CompressibilityPhasicitySpontaneityPropertiesThrombus Aging +---------+---------------+---------+-----------+----------+--------------+ CFV      Full           Yes      Yes                                 +---------+---------------+---------+-----------+----------+--------------+ SFJ      Full                                                        +---------+---------------+---------+-----------+----------+--------------+ FV Prox  Full                                                        +---------+---------------+---------+-----------+----------+--------------+ FV Mid   Full                                                        +---------+---------------+---------+-----------+----------+--------------+ FV DistalFull                                                        +---------+---------------+---------+-----------+----------+--------------+ PFV      Full                                                        +---------+---------------+---------+-----------+----------+--------------+ POP      Full           Yes      Yes                                 +---------+---------------+---------+-----------+----------+--------------+  PTV      Full                                                        +---------+---------------+---------+-----------+----------+--------------+ PERO     Full                                                        +---------+---------------+---------+-----------+----------+--------------+     Summary: Right: There is no evidence of deep vein thrombosis in the lower extremity. Left: There is no evidence of deep  vein thrombosis in the lower extremity.  *See table(s) above for measurements and observations. Electronically signed by Servando Snare MD on 05/27/2019 at 3:29:04 PM.    Final     Stark Klein, MD 05/28/2019, 9:37 AM PGY-1, [place] Intern pager: [phone], text pages welcome

## 2019-05-28 NOTE — Progress Notes (Signed)
Straight cath completed, volume 750. Patient express feeling bladder relieve. Will continue to monitor

## 2019-05-28 NOTE — Progress Notes (Addendum)
Patient bladder scanned for 474. Resident on call paged regarding.    Addendum: in & out catheterization performed, yielded 525 of clear, yellow urine.

## 2019-05-29 DIAGNOSIS — L89203 Pressure ulcer of unspecified hip, stage 3: Secondary | ICD-10-CM

## 2019-05-29 LAB — CBC WITH DIFFERENTIAL/PLATELET
Abs Immature Granulocytes: 0.05 10*3/uL (ref 0.00–0.07)
Basophils Absolute: 0 10*3/uL (ref 0.0–0.1)
Basophils Relative: 0 %
Eosinophils Absolute: 0 10*3/uL (ref 0.0–0.5)
Eosinophils Relative: 0 %
HCT: 41.6 % (ref 39.0–52.0)
Hemoglobin: 14 g/dL (ref 13.0–17.0)
Immature Granulocytes: 0 %
Lymphocytes Relative: 4 %
Lymphs Abs: 0.5 10*3/uL — ABNORMAL LOW (ref 0.7–4.0)
MCH: 31.7 pg (ref 26.0–34.0)
MCHC: 33.7 g/dL (ref 30.0–36.0)
MCV: 94.1 fL (ref 80.0–100.0)
Monocytes Absolute: 0.7 10*3/uL (ref 0.1–1.0)
Monocytes Relative: 5 %
Neutro Abs: 12.4 10*3/uL — ABNORMAL HIGH (ref 1.7–7.7)
Neutrophils Relative %: 91 %
Platelets: 276 10*3/uL (ref 150–400)
RBC: 4.42 MIL/uL (ref 4.22–5.81)
RDW: 12.4 % (ref 11.5–15.5)
WBC: 13.7 10*3/uL — ABNORMAL HIGH (ref 4.0–10.5)
nRBC: 0 % (ref 0.0–0.2)

## 2019-05-29 LAB — COMPREHENSIVE METABOLIC PANEL
ALT: 74 U/L — ABNORMAL HIGH (ref 0–44)
AST: 27 U/L (ref 15–41)
Albumin: 2.3 g/dL — ABNORMAL LOW (ref 3.5–5.0)
Alkaline Phosphatase: 47 U/L (ref 38–126)
Anion gap: 12 (ref 5–15)
BUN: 15 mg/dL (ref 8–23)
CO2: 23 mmol/L (ref 22–32)
Calcium: 8.1 mg/dL — ABNORMAL LOW (ref 8.9–10.3)
Chloride: 97 mmol/L — ABNORMAL LOW (ref 98–111)
Creatinine, Ser: 0.48 mg/dL — ABNORMAL LOW (ref 0.61–1.24)
GFR calc Af Amer: >60 mL/min (ref >60)
GFR calc non Af Amer: >60 mL/min (ref >60)
Glucose, Bld: 93 mg/dL (ref 70–99)
Potassium: 3.3 mmol/L — ABNORMAL LOW (ref 3.5–5.1)
Sodium: 132 mmol/L — ABNORMAL LOW (ref 135–145)
Total Bilirubin: 1 mg/dL (ref 0.3–1.2)
Total Protein: 5.6 g/dL — ABNORMAL LOW (ref 6.5–8.1)

## 2019-05-29 LAB — C-REACTIVE PROTEIN: CRP: 8.2 mg/dL — ABNORMAL HIGH (ref <1.0)

## 2019-05-29 LAB — D-DIMER, QUANTITATIVE: D-Dimer, Quant: 2.89 ug/mL-FEU — ABNORMAL HIGH (ref 0.00–0.50)

## 2019-05-29 LAB — FIBRINOGEN: Fibrinogen: 710 mg/dL — ABNORMAL HIGH (ref 210–475)

## 2019-05-29 MED ORDER — ROSUVASTATIN CALCIUM 5 MG PO TABS
10.0000 mg | ORAL_TABLET | Freq: Every day | ORAL | Status: DC
  Administered 2019-05-29 – 2019-06-09 (×12): 10 mg via ORAL
  Filled 2019-05-29 (×12): qty 2

## 2019-05-29 MED ORDER — IBUPROFEN 400 MG PO TABS
400.0000 mg | ORAL_TABLET | ORAL | Status: DC | PRN
  Administered 2019-05-30 – 2019-06-05 (×12): 400 mg via ORAL
  Filled 2019-05-29 (×12): qty 1

## 2019-05-29 MED ORDER — IBUPROFEN 100 MG PO CHEW
400.0000 mg | CHEWABLE_TABLET | ORAL | Status: DC | PRN
  Administered 2019-05-29 (×2): 400 mg via ORAL
  Filled 2019-05-29 (×4): qty 4

## 2019-05-29 MED ORDER — TAMSULOSIN HCL 0.4 MG PO CAPS
0.8000 mg | ORAL_CAPSULE | Freq: Every day | ORAL | Status: DC
  Administered 2019-05-29 – 2019-06-09 (×12): 0.8 mg via ORAL
  Filled 2019-05-29 (×12): qty 2

## 2019-05-29 MED ORDER — FINASTERIDE 5 MG PO TABS
5.0000 mg | ORAL_TABLET | Freq: Every day | ORAL | Status: DC
  Administered 2019-05-29 – 2019-06-09 (×12): 5 mg via ORAL
  Filled 2019-05-29 (×12): qty 1

## 2019-05-29 MED ORDER — ACETAMINOPHEN 325 MG PO TABS
650.0000 mg | ORAL_TABLET | Freq: Four times a day (QID) | ORAL | Status: DC | PRN
  Administered 2019-05-29 – 2019-06-09 (×15): 650 mg via ORAL
  Filled 2019-05-29 (×17): qty 2

## 2019-05-29 MED ORDER — SODIUM CHLORIDE 0.9 % IV SOLN: 2.0000 g | Freq: Three times a day (TID) | INTRAVENOUS | Status: DC

## 2019-05-29 MED ORDER — METRONIDAZOLE IN NACL 5-0.79 MG/ML-% IV SOLN: 500.0000 mg | Freq: Three times a day (TID) | INTRAVENOUS | Status: DC

## 2019-05-29 NOTE — Progress Notes (Addendum)
Family Medicine Teaching Service Daily Progress Note Intern Pager: [phone]  Patient name: Ivan [name] Medical record number: [id number] Date of birth: 06-01-1945 Age: 74 y.o. Gender: male  Primary Care Provider: Sherald Hess., MD Consultants: Wound care, general surgery Code Status: Full code  Pt Overview and Major Events to Date:  1/13: Patient admitted for altered mental status and worsening pressure injury to left hip, started on vancomycin and cefepime, Lovenox at PE treatment level  Assessment and Plan: Ivan [name] is a 74 y.o. male admitted for AMS concerning for sepsis and COVID + with worsening pressure injury to left hip. PMH is significant for HTN, HLD, CAD, +COVID-19.  AMS  Sepsis  Patient mental status only oriented to self, but conversant and eating his breakfast. Mild complaints of pain, but says well controlled. Overall he says he is pleased with his cared.   VSS and afebrile. WBC pending for today, but improved yesteraday.  1 of 2 BCx grew coag neg staph. likely contaminant,  - d/c vancomycin and cefepime and flagyll -PT/OT : recommending SNF - SW consult for SNF   Necrotic Left Hip Pressure Ulcer  GS softening escar, possible OR dibridement. Clinically, does not appear to be infected - Ibuoprofen and tylenol prn pain - General surgery recommendations appreciated - Wound care nurse recommendations appreciated  Hypokalemia  Trend K, replete as necessary   Elevated D-dimer (improved) Trending downward. Likely in setting of COVID19 infection. LEx dopplers negative for DVT.    COVID + Patient positive for COVID -19 on 05/18/19, however approximately started developing symptoms on 1/2.  Received approximately 4 days of remdesivir and 1 day of IV dexamethasone in his recent admission. He should isolate for 21 days and will follow these guidelines when preparing for disposition. SORA.  -continue air borne and droplet precautions    AKI (resolved)  Acute  urinary rention likely secondary to BPH Cr pending this AM, but back at BL at 0.68 yestarday. Able to tolerate PO. Patient with urinary retention. Bladder scan showing > 400 and I&O cath. Started on flomax yestarday. Will increase flomax and stop IVF. Monitor UOP and continue to bladder scan.  - Bladder scan q6h - increase flomax to 0.8 mg daily - restart home finasteride - d/c IVF - monitor UOP - f/u Cr - cont home lisinopril   Transaminitis (Improving) Trending in right direction. LFTs pending this AM> This is likely 2/2 to COVID infection/sepsis.  -monitor LFT   Elevated CK improving Normal UOP. Likely improving from recent hospitalization - monitor UOP   CAD, previous MI: Chronic, stable. Previous STEMI in 2016 and MI in 2012 per chart review.  His home medications include managed on lisinopril, nebivolol, ?aspirin vs plavix, and Crestor at home. Unsure if patient has been taking beta blocker at home.  - restart ASA and statin   Previous CVA: Chronic, stable. No known residual deficits.  Per chart review appears to be on Plavix, however was not on his outpatient medication list and his wife has been unclear about his medications.  We were not able to clarify this on his last hospitalization, will discuss as his mental status improves.  Recent brain MRI on 1/5 without acute intracranial abnormalities. -restart ASA and statin   Hypertension: Chronic, stable. Normotensive this AM. On home lisinopril - monitor BP   Hyperlipidemia: Chronic, stable. Takes rosuvastatin at home for secondary prevention. - restart statin given improvement in LFTs and CK   Depression/anxiety: Chronic, stable. He is on  numerous medications for this including Wellbutrin, Xanax, hydroxyzine, Restoril.  Per reviewing PMP aware, he last received 60 tablets of Xanax in 01/2019 and 30 tablets of Restoril in 12/2018.  Do have some concern for polypharmacy contributing to his altered mental status. -Hold home  medications above, low concern for withdrawal as he should not have enough of these medications from previous prescriptions to be taking on a consistent basis long-term unless he is received these from alternative sources   Back pain/degenerative disc disease: Known severe disease which I suspect was exacerbated after presumed recent fall prior to last admission, recently left AMA due to perceived inadequate pain control. He currently is quite altered and thus will avoid opioids at this time, additionally will avoid NSAIDs given current COVID.    lidocaine patch and K pad to help in the interim.   - Tylenol and ibuprofen prn pain    FEN/GI: HHD PPx: Lovenox per pharmacy  Disposition: inpatient   Subjective:  Patient mental status only oriented to self, but conversant and eating his breakfast. Mild complaints of pain, but says well controlled. Overall he says he is pleased with his cared.   Objective: Temp:  [97.7 F (36.5 C)-98.7 F (37.1 C)] 97.9 F (36.6 C) (01/16 0736) Pulse Rate:  [71-84] 73 (01/16 0736) Resp:  [12-19] 13 (01/16 0736) BP: (136-168)/(71-100) 136/71 (01/16 0736) SpO2:  [95 %-99 %] 99 % (01/16 0736)  Physical Exam: General: male appearing stated age, lying calmly in bed  Cardiovascular: RRR without murmurs, gallops or friction rubs  Respiratory: Decreased breath sounds throughout, do not appreciate crackles or wheezing  Abdomen: abdomen softer than previous exam, NT to palpation, minimal bowel sounds appreciated  Extremities: No lower extremity edema, left hip pressure ulcer with eschar, no drainage Neuro: alert, oriented to person, location, city and state and year, not to president, follows commands but still confused intermittently during conversation  Laboratory: Recent Labs  Lab 05/26/19 1300 05/27/19 0445 05/28/19 0309  WBC 23.4* 16.9* 14.6*  HGB 15.7 14.9 14.8  HCT 47.5 44.6 43.4  PLT 511* 399 354   Recent Labs  Lab 05/26/19 1300 05/26/19 1300  05/26/19 1553 05/27/19 0445 05/28/19 0309  NA 134*  --   --  137 134*  K 5.2*  --   --  3.9 3.2*  CL 98  --   --  103 100  CO2 24  --   --  27 26  BUN 26*  --   --  21 16  CREATININE 0.96  --   --  0.79 0.68  CALCIUM 8.4*  --   --  8.4* 8.1*  PROT 6.1*   < > 6.4* 5.8* 5.6*  BILITOT 2.2*   < > 1.1 1.3* 1.1  ALKPHOS 60   < > 70 61 55  ALT 197*   < > 196* 153* 116*  AST 144*   < > 112* 78* 48*  GLUCOSE 111*  --   --  109* 111*   < > = values in this interval not displayed.    Imaging/Diagnostic Tests: No results found.  Bonnita Hollow, MD 05/29/2019, 9:27 AM PGY-3, [place] Intern pager: [phone], text pages welcome

## 2019-05-30 LAB — CULTURE, BLOOD (ROUTINE X 2)

## 2019-05-30 LAB — FIBRINOGEN: Fibrinogen: 728 mg/dL — ABNORMAL HIGH (ref 210–475)

## 2019-05-30 LAB — CBC
HCT: 42 % (ref 39.0–52.0)
Hemoglobin: 14.3 g/dL (ref 13.0–17.0)
MCH: 31.3 pg (ref 26.0–34.0)
MCHC: 34 g/dL (ref 30.0–36.0)
MCV: 91.9 fL (ref 80.0–100.0)
Platelets: 318 10*3/uL (ref 150–400)
RBC: 4.57 MIL/uL (ref 4.22–5.81)
RDW: 12.2 % (ref 11.5–15.5)
WBC: 13.1 10*3/uL — ABNORMAL HIGH (ref 4.0–10.5)
nRBC: 0 % (ref 0.0–0.2)

## 2019-05-30 LAB — BASIC METABOLIC PANEL
Anion gap: 12 (ref 5–15)
BUN: 12 mg/dL (ref 8–23)
CO2: 24 mmol/L (ref 22–32)
Calcium: 8.2 mg/dL — ABNORMAL LOW (ref 8.9–10.3)
Chloride: 99 mmol/L (ref 98–111)
Creatinine, Ser: 0.49 mg/dL — ABNORMAL LOW (ref 0.61–1.24)
GFR calc Af Amer: >60 mL/min (ref >60)
GFR calc non Af Amer: >60 mL/min (ref >60)
Glucose, Bld: 94 mg/dL (ref 70–99)
Potassium: 3.4 mmol/L — ABNORMAL LOW (ref 3.5–5.1)
Sodium: 135 mmol/L (ref 135–145)

## 2019-05-30 LAB — D-DIMER, QUANTITATIVE: D-Dimer, Quant: 2.7 ug/mL-FEU — ABNORMAL HIGH (ref 0.00–0.50)

## 2019-05-30 LAB — C-REACTIVE PROTEIN: CRP: 8.4 mg/dL — ABNORMAL HIGH (ref <1.0)

## 2019-05-30 MED ORDER — POTASSIUM CHLORIDE CRYS ER 20 MEQ PO TBCR
40.0000 meq | EXTENDED_RELEASE_TABLET | Freq: Once | ORAL | Status: AC
  Administered 2019-05-30: 40 meq via ORAL
  Filled 2019-05-30: qty 2

## 2019-05-30 NOTE — Progress Notes (Signed)
Patient appears to be more continent of urine and has had improved output throughout the night.  Patient removed dressing from wound on left hip. RN cleaned and re-dressed it. He had called out for assistance because he did not know how the dressing had been removed. Patient told RN that he is "very concerned" because he could not remember removing it.

## 2019-05-30 NOTE — Progress Notes (Signed)
FPTS Interim Progress Note Received page from SW that patient's spouse expressed concern about her husband being recommended for SNF placement upon discharge in facilities that only accept COVID+ patients. She expressed frustration that she has not been able to speak with the patient as when she calls the room, she does not get an answer and has not been able to speak with him for some time. She reports that the patient was tested for COVID on 1/5 and that he had previously tested negative at his place of employment on 05/16/19; stating that she believes the test was likely a false positive and she requests that he have another COVID test completed. Spouse was informed that the patient would be re-tested for COVID prior to transferring to SNF. Patient's wife became agitated and adds that the patient's FMLA paperwork is waiting to be completed and that providers have refused to complete this work. I informed her that the protocol for this paperwork is for the patient's PCP to complete this paperwork. She proceeded to express concerns that his PCP has recently started seeing him and will not be aware of his hospitalization and treatment course. Ms. Madaline Savage was informed that the PCP would receive documentation detailing the patient's hospital course in order to help relay information. Conversation was then transferred to Dr. Higinio Plan.   Stark Klein, MD 05/30/2019, 12:28 PM PGY-1, [place] Medicine Service pager [phone]

## 2019-05-30 NOTE — Progress Notes (Signed)
FTPS Interim Progress Note:  Follow-up to Dr. Rosita Fire progress note for telephone encounter with Mr. Newsome's wife, Ms. Luper.  Took over conversation as Ms. Luper's unpleasant demeanor was escalating  Her problems were twofold.  She was frustrated that his FMLA paperwork had not been filled out yet.  Discussed with her that we are absolutely more than happy to provide her with a letter documenting his length of stay and indication for hospitalization in addition to recognizing that his PCP would receive his hospitalization records either via epic or fax including a discharge summary.  Also discussed that his PCP will be following him on a long-term basis and will be able to best address continue requirements for FMLA.  She agreed this was reasonable.   Additionally, she was outraged that he cannot go to [facility] for SNF as they do not accept any Covid positive patients and was demanding a reswab for Covid as it "may have been a false positive" previously.  She is not interested in considering any other facilities that may have Covid positive patients, however was willing to agree with what her husband wished.  Discussed with her that he tested positive for Covid on 1/5 and 1/8 and had objective/subjective clinical findings suggestive of Covid, therefore absolutely doubt a false positive.  Stated he would be retested for Covid prior to going to a SNF, but that it was not clinically indicated for me to get a repeat today.  In efforts to have her feel more comfortable, let her know I or Dr. Rosita Fire will see Mr. Garry this afternoon and call her so that they can discuss his wishes for SNF.  During this conversation I also updated her on his improving clinical status.  All of her questions were answered.  Will do our best to give her daily updates.   Patriciaann Clan, DO

## 2019-05-30 NOTE — TOC Progression Note (Signed)
Transition of Care [facility]) - Progression Note    Patient Details  Name: Ivan [name] MRN: [id number] Date of Birth: January 18, 1946  Transition of Care [facility]) CM/SW [place], [place] Work Phone Number: 05/30/2019, 4:03 PM  Clinical Narrative:    MSW Intern received a call from pt's wife with an update about goals of care. She spoke with the pt who stated that he only wanted to go to [facility] and they would now not consider [facility]. She also stated that the Dr agreed to retest pt for covid. Wife wants to wait for after test to see if he tests negative and can get better options, specifically [facility]. MSW intern stated that their preference would be noted. SW will continue to follow.  Expected Discharge Plan: [place] Barriers to Discharge: Continued Medical Work up  Expected Discharge Plan and Services Expected Discharge Plan: [place] arrangements for the past 2 months: Single Family Home                                       Social Determinants of Health (SDOH) Interventions    Readmission Risk Interventions No flowsheet data found.

## 2019-05-30 NOTE — TOC Progression Note (Deleted)
Transition of Care [facility]) - Progression Note    Patient Details  Name: Ivan [name] MRN: [id number] Date of Birth: 11/14/1945  Transition of Care [facility]) CM/SW [place], [place] Work Phone Number: 05/30/2019, 11:11 AM  Clinical Narrative:     MSW Intern Spoke with pt's wife, to discuss goals of care, with physical therapy recommending SNF Placement. Ivan [name] was agitated and had many concerns with her husbands care. She said the she would consider SNF, but wants [facility]. PT wife was advised that [facility] is not taking Covid positive patients at the moment. Ivan [name] became more agitated, she said that she wants him to have another Covid test, because she doesn't think he is positive anymore. She stated that he became ill after receiving the Covid vaccine.  She also stated that the doctor she spoke to refused to fill out pt's FMLA paperwork so he wouldn't lose his job. She also expressed that she didn't feel like anyone was paying attention to her husband, and she is very frustrated. MSW Intern told her that the doctor would be messaged to touch base with her, and she was given the number to patient experience. She is currently refusing to make goals of care until she speaks with the pt, nurse, and doctor. She requested she be called back this afternoon to discuss goals of care. Resident confirmed she will call pt's wife. MSW Intern will follow.       Expected Discharge Plan and Services                                                 Social Determinants of Health (SDOH) Interventions    Readmission Risk Interventions No flowsheet data found.

## 2019-05-30 NOTE — Plan of Care (Signed)
  Problem: Education: Goal: Knowledge of risk factors and measures for prevention of condition will improve Outcome: Not Progressing  Pt confused

## 2019-05-30 NOTE — Progress Notes (Signed)
Subjective Asked to see for any updated wound care recommendations  Objective: Vital signs in last 24 hours: Temp:  [98.2 F (36.8 C)-98.4 F (36.9 C)] 98.4 F (36.9 C) (01/17 0823) Pulse Rate:  [66-88] 77 (01/17 0823) Resp:  [16-20] 18 (01/17 0823) BP: (149-173)/(69-89) 173/81 (01/17 0823) SpO2:  [97 %-99 %] 99 % (01/17 0823) Last BM Date: 05/27/19  Intake/Output from previous day: 01/16 0701 - 01/17 0700 In: 1150 [P.O.:625; I.V.:325; IV Piggyback:200] Out: 1200 [Urine:1200] Intake/Output this shift: Total I/O In: -  Out: 600 [Urine:600]  Gen: NAD, comfortable CV: RRR Pulm: Normal work of breathing; not on oxygen presently Left hip area - stable appearing eschar from note 3d ago - no signfiicant surrounding erythema or drainage. Eschar was being covered with dry dressing. This remains firm. Ext: SCDs in place  Lab Results: CBC  Recent Labs    05/29/19 1047 05/30/19 0417  WBC 13.7* 13.1*  HGB 14.0 14.3  HCT 41.6 42.0  PLT 276 318   BMET Recent Labs    05/29/19 1047 05/30/19 0417  NA 132* 135  K 3.3* 3.4*  CL 97* 99  CO2 23 24  GLUCOSE 93 94  BUN 15 12  CREATININE 0.48* 0.49*  CALCIUM 8.1* 8.2*   PT/INR No results for input(s): LABPROT, INR in the last 72 hours. ABG No results for input(s): PHART, HCO3 in the last 72 hours.  Invalid input(s): PCO2, PO2  Studies/Results:  Anti-infectives: Anti-infectives (From admission, onward)   Start     Dose/Rate Route Frequency Ordered Stop   05/29/19 1330  ceFEPIme (MAXIPIME) 2 g in sodium chloride 0.9 % 100 mL IVPB  Status:  Discontinued     2 g 200 mL/hr over 30 Minutes Intravenous Every 8 hours 05/29/19 0916 05/29/19 1123   05/29/19 1100  metroNIDAZOLE (FLAGYL) IVPB 500 mg  Status:  Discontinued     500 mg 100 mL/hr over 60 Minutes Intravenous Every 8 hours 05/29/19 0916 05/29/19 1123   05/27/19 1845  vancomycin (VANCOCIN) IVPB 1000 mg/200 mL premix  Status:  Discontinued     1,000 mg 200 mL/hr over 60  Minutes Intravenous Every 12 hours 05/27/19 1219 05/29/19 1123   05/26/19 2200  ceFEPIme (MAXIPIME) 2 g in sodium chloride 0.9 % 100 mL IVPB  Status:  Discontinued     2 g 200 mL/hr over 30 Minutes Intravenous Every 8 hours 05/26/19 1852 05/29/19 0913   05/26/19 1900  metroNIDAZOLE (FLAGYL) IVPB 500 mg  Status:  Discontinued     500 mg 100 mL/hr over 60 Minutes Intravenous Every 8 hours 05/26/19 1849 05/29/19 0913   05/26/19 1845  vancomycin (VANCOREADY) IVPB 1250 mg/250 mL  Status:  Discontinued     1,250 mg 166.7 mL/hr over 90 Minutes Intravenous Every 12 hours 05/26/19 1841 05/27/19 1219       Assessment/Plan: Patient Active Problem List   Diagnosis Date Noted  . Pressure injury of skin 05/28/2019  . Delirium due to general medical condition   . Traumatic rhabdomyolysis ([place])   . Wound, open, hip or thigh with complication, left, initial encounter   . Left against medical advice   . Metabolic encephalopathy   . Elevated CK   . Generalized weakness   . Slurred speech   . Pneumonia due to COVID-19 virus 05/19/2019  . AMS (altered mental status) 05/18/2019  . Benign prostatic hyperplasia with urinary obstruction 12/15/2015  . Pleurisy 08/10/2015  . Depression with anxiety 08/10/2015  . Urinary frequency 08/10/2015  .  Insomnia 08/10/2015  . Other vascular headache   . Thrombus   . TIA (transient ischemic attack)   . SOB (shortness of breath)   . Stroke ([place])   . Essential hypertension   . Hyperlipidemia   . ST elevation myocardial infarction (STEMI) involving right coronary artery in recovery phase ([place]) 10/12/2014  . DDD (degenerative disc disease), lumbar 05/16/2014  . Narcolepsy   . CAD (coronary artery disease)   . PTSD (post-traumatic stress disorder)     -Recommend covering with wet to dry dressing (moist 4x4), changing twice daily -Given recent COVID diagnosis, general anesthesia does carry significant pulmary risks including potential mortality. Currently this  appears to be dry and with wet to dry dressing changes should slough - would recommend attempt at this first; if not cleaning up, would need pulmonary eval/clearance prior to any attempted surgical debridement as he does not appear to be able to tolerate a bedside debridement   LOS: 4 days   Sharon Mt. Dema Severin, M.D. [facility] Use [email or web address] to contact on call provider

## 2019-05-30 NOTE — Progress Notes (Addendum)
As agreed upon earlier via phone conversation with patient's wife, I reported to bedside in order to conduct phone meeting including patient, his wife and myself in order to clarify patient's preference for discharge to SNF facilities.  Both patient and his wife stated that they would prefer that patient be considered for [facility] upon discharge.  It is also agreed that the patient will be retested prior to placement in 1 of these facilities for COVID-19.  Patient's wife grew increasingly agitated at the fact that I was present for this phone conversation despite agreeing to this plan earlier in the day via telephone.  Patient's wife requested that this MD leave the room so that she and her husband could speak privately.  Patient was grateful for this meeting and opportunity to clarify his preferences upon discharge.  Both patient and his wife are hopeful that repeat Covid test will be negative and allow for more options for SNF placement.   Stark Klein, MD  PGY-1 [place] Intern Pager [phone]

## 2019-05-30 NOTE — NC FL2 (Signed)
[place] LEVEL OF CARE SCREENING TOOL     IDENTIFICATION  Patient Name: Ivan [name] Birthdate: 1946/04/24 Sex: male Admission Date (Current Location): 05/26/2019  [facility] [place] Number:  Herbalist and Address:  [facility], [place] [address], [place], [place] [address]      Provider Number: [id number]  Attending Physician Name and Address:  Zenia Resides, MD  Relative Name and Phone Number:  Arvil Chaco, [phone]    Current Level of Care: Hospital Recommended Level of Care: [place] Prior Approval Number:    Date Approved/Denied:   PASRR Number:    Discharge Plan: SNF    Current Diagnoses: Patient Active Problem List   Diagnosis Date Noted  . Pressure injury of skin 05/28/2019  . Delirium due to general medical condition   . Traumatic rhabdomyolysis ([place])   . Wound, open, hip or thigh with complication, left, initial encounter   . Left against medical advice   . Metabolic encephalopathy   . Elevated CK   . Generalized weakness   . Slurred speech   . Pneumonia due to COVID-19 virus 05/19/2019  . AMS (altered mental status) 05/18/2019  . Benign prostatic hyperplasia with urinary obstruction 12/15/2015  . Pleurisy 08/10/2015  . Depression with anxiety 08/10/2015  . Urinary frequency 08/10/2015  . Insomnia 08/10/2015  . Other vascular headache   . Thrombus   . TIA (transient ischemic attack)   . SOB (shortness of breath)   . Stroke ([place])   . Essential hypertension   . Hyperlipidemia   . ST elevation myocardial infarction (STEMI) involving right coronary artery in recovery phase ([place]) 10/12/2014  . DDD (degenerative disc disease), lumbar 05/16/2014  . Narcolepsy   . CAD (coronary artery disease)   . PTSD (post-traumatic stress disorder)     Orientation RESPIRATION BLADDER Height & Weight     Self  Normal Continent Weight: 209 lb 7 oz (95 kg) Height:  5\' 10"  (177.8  cm)  BEHAVIORAL SYMPTOMS/MOOD NEUROLOGICAL BOWEL NUTRITION STATUS      Continent Diet(See discharge summary)  AMBULATORY STATUS COMMUNICATION OF NEEDS Skin   Total Care Verbally PU Stage and Appropriate Care, Skin abrasions     PU Stage 3 Dressing: BID                 Personal Care Assistance Level of Assistance  Bathing, Feeding, Dressing Bathing Assistance: Maximum assistance Feeding assistance: Maximum assistance Dressing Assistance: Maximum assistance     Functional Limitations Info  Sight, Hearing, Speech Sight Info: Impaired(Impaired, right and Left) Hearing Info: Adequate Speech Info: Impaired(Difficulty)    SPECIAL CARE FACTORS FREQUENCY  PT (By licensed PT), OT (By licensed OT), Speech therapy     PT Frequency: 5x week OT Frequency: 5x week     Speech Therapy Frequency: 5x week      Contractures Contractures Info: Not present    Additional Factors Info  Code Status, Allergies, Isolation Precautions Code Status Info: Full Allergies Info: Contrast Media iodinated Diagnostic Agents     Isolation Precautions Info: Covid Positive     Current Medications (05/30/2019):  This is the current hospital active medication list Current Facility-Administered Medications  Medication Dose Route Frequency Provider Last Rate Last Admin  . acetaminophen (TYLENOL) tablet 650 mg  650 mg Oral Q6H PRN Bonnita Hollow, MD   650 mg at 05/30/19 1257  . aspirin EC tablet 81 mg  81 mg Oral Daily Rosita Fire,  Riki Sheer, MD   81 mg at 05/30/19 0934  . enoxaparin (LOVENOX) injection 40 mg  40 mg Subcutaneous Q24H Stark Klein, MD   40 mg at 05/30/19 0935  . finasteride (PROSCAR) tablet 5 mg  5 mg Oral Daily Bonnita Hollow, MD   5 mg at 05/30/19 0934  . ibuprofen (ADVIL) tablet 400 mg  400 mg Oral Q4H PRN Hensel, Jamal Collin, MD      . lidocaine (LIDODERM) 5 % 1 patch  1 patch Transdermal Q24H Darrelyn Hillock N, DO   1 patch at 05/29/19 2119  . lisinopril (ZESTRIL) tablet 40 mg  40 mg  Oral Daily Stark Klein, MD   40 mg at 05/30/19 0934  . rosuvastatin (CRESTOR) tablet 10 mg  10 mg Oral Daily Bonnita Hollow, MD   10 mg at 05/30/19 0934  . tamsulosin (FLOMAX) capsule 0.8 mg  0.8 mg Oral Daily Bonnita Hollow, MD   0.8 mg at 05/30/19 [id number]     Discharge Medications: Please see discharge summary for a list of discharge medications.  Relevant Imaging Results:  Relevant Lab Results:   Additional Information ss# [id number], Covid positive  Kirstie Peri, Student-Social Work

## 2019-05-30 NOTE — TOC Initial Note (Signed)
Transition of Care [facility]) - Initial/Assessment Note    Patient Details  Name: Ivan [name] MRN: [id number] Date of Birth: 1945/10/01  Transition of Care [facility]) CM/SW Contact:    Kirstie Peri, [place] Work Phone Number: 05/30/2019, 2:36 PM  Clinical Narrative:                  MSW Intern Spoke with pt's wife, to discuss goals of care, with physical therapy recommending SNF Placement. Neoma Laming was agitated and had many concerns with her husbands care. She said the she would consider SNF, but wants [facility]. PT wife was advised that [facility] is not taking Covid positive patients at the moment. Neoma Laming became more agitated, she said that she wants him to have another Covid test, because she doesn't think he is positive anymore. She stated that he became ill after receiving the Covid vaccine.  She also stated that the doctor she spoke to refused to fill out pt's FMLA paperwork so he wouldn't lose his job. She also expressed that she didn't feel like anyone was paying attention to her husband, and she is very frustrated. MSW Intern told her that the doctor would be messaged to touch base with her, and she was given the number to patient experience. She is currently refusing to make goals of care until she speaks with the pt, nurse, and doctor. She requested she be called back this afternoon to discuss goals of care. Resident confirmed she will call pt's wife. MSW Intern will follow.  Pt's wife was contacted per her request, and she stated that she did not want to make a final decision until she spoke with the pt. She stated that the nurse was supposed to call her and help pt with the phone. She was still unhappy, but agreed to have pt faxed out to [facility] with a preference for [facility]. She looked up the facilities and seemed unimpressed with the options. She said she would call back later to give a final answer.       Patient Goals and CMS Choice        Expected Discharge Plan and Services                                                Prior Living Arrangements/Services                       Activities of Daily Living Home Assistive Devices/Equipment: Eyeglasses, Scales ADL Screening (condition at time of admission) Patient's cognitive ability adequate to safely complete daily activities?: Yes Is the patient deaf or have difficulty hearing?: No Does the patient have difficulty seeing, even when wearing glasses/contacts?: No Does the patient have difficulty concentrating, remembering, or making decisions?: Yes Patient able to express need for assistance with ADLs?: Yes Does the patient have difficulty dressing or bathing?: Yes Independently performs ADLs?: No Communication: Independent Dressing (OT): Needs assistance Is this a change from baseline?: Change from baseline, expected to last <3days Grooming: Needs assistance Is this a change from baseline?: Change from baseline, expected to last >3 days Feeding: Independent Bathing: Needs assistance Is this a change from baseline?: Change from baseline, expected to last >3 days Toileting: Needs assistance Is this a change from baseline?: Change from baseline, expected to last >3days In/Out Bed: Needs assistance Is this  a change from baseline?: Change from baseline, expected to last >3 days Walks in Home: Dependent Is this a change from baseline?: Change from baseline, expected to last >3 days Does the patient have difficulty walking or climbing stairs?: Yes Weakness of Legs: Both Weakness of Arms/Hands: Both  Permission Sought/Granted                  Emotional Assessment              Admission diagnosis:  Metabolic encephalopathy [id number] Wound, open, hip or thigh with complication, left, initial encounter [S71.002A, S71.102A] Traumatic rhabdomyolysis, initial encounter ([place]) [T79.6XXA] AMS (altered mental status) [R41.82] Pneumonia due to  COVID-19 virus [U07.1, J12.82] Patient Active Problem List   Diagnosis Date Noted  . Pressure injury of skin 05/28/2019  . Delirium due to general medical condition   . Traumatic rhabdomyolysis ([place])   . Wound, open, hip or thigh with complication, left, initial encounter   . Left against medical advice   . Metabolic encephalopathy   . Elevated CK   . Generalized weakness   . Slurred speech   . Pneumonia due to COVID-19 virus 05/19/2019  . AMS (altered mental status) 05/18/2019  . Benign prostatic hyperplasia with urinary obstruction 12/15/2015  . Pleurisy 08/10/2015  . Depression with anxiety 08/10/2015  . Urinary frequency 08/10/2015  . Insomnia 08/10/2015  . Other vascular headache   . Thrombus   . TIA (transient ischemic attack)   . SOB (shortness of breath)   . Stroke ([place])   . Essential hypertension   . Hyperlipidemia   . ST elevation myocardial infarction (STEMI) involving right coronary artery in recovery phase ([place]) 10/12/2014  . DDD (degenerative disc disease), lumbar 05/16/2014  . Narcolepsy   . CAD (coronary artery disease)   . PTSD (post-traumatic stress disorder)    PCP:  Sherald Hess., MD Pharmacy:   [id number] - [place], [place] [address] [place] [address] Phone: [phone] Fax: [place] [phone] [place], [place] [facility] [place] [address] [place] [address] Phone: [phone] Fax: [phone]     Social Determinants of Health (SDOH) Interventions    Readmission Risk Interventions No flowsheet data found.

## 2019-05-30 NOTE — Progress Notes (Signed)
Family Medicine Teaching Service Daily Progress Note Intern Pager: [phone]  Patient name: Ivan [name] Medical record number: [id number] Date of birth: 04/12/1946 Age: 74 y.o. Gender: male  Primary Care Provider: Sherald Hess., MD Consultants: Wound care, general surgery Code Status: Full code  Pt Overview and Major Events to Date:  1/13: Patient admitted for altered mental status and worsening pressure injury to left hip, started on vancomycin and cefepime, Lovenox at PE treatment level  Assessment and Plan: Ivan [name] is a 74 y.o. male admitted for AMS concerning for sepsis and COVID + with worsening pressure injury to left hip. PMH is significant for HTN, HLD, CAD, +COVID-19.  AMS  Sepsis  Patient's mental status improved, alert and oriented to person, place, city, state but not to president. Inquired as to why patient was undressed and he could not remember removing his gown. Still has some component of confusion but improved from initial presentation.  Vitals remain stable and afebrile since 1/13. WBC downtrending since adm, now 13.1.   1 of 2 BCx grew coag neg staph, believed to be contaminant. Completed course of Vanc and cefepime on 1/16, 4 days of abx  -PT/OT : recommending SNF - SW consulted for SNF   Necrotic Left Hip Pressure Ulcer  General surgery consulted and softening escar. Clinically, does not appear to be infected - Ibuoprofen and tylenol prn pain - General surgery recommendations appreciated  Hypokalemia  K 3.4  -Trend K - replete as necessary   Elevated D-dimer (improved) D-dimer today 2.7 from 3.76  Trending downward. Likely in setting of COVID19 infection. LEx dopplers negative for DVT.    COVID + Patient positive for COVID -19 on 05/18/19, however approximately started developing symptoms on 1/2.  Received approximately 4 days of remdesivir and 1 day of IV dexamethasone in his recent admission. SORA at 99% oxygen saturation -continue air  borne and droplet precautions  -monitor respiratory status    AKI (resolved)  Acute urinary rention likely secondary to BPH Cr 0.49. Able to tolerate PO. Started on flomax 1/15, restarted home finasteride as well on 1/16.  Monitor UOP. Urine output 129mL overnight.  - discontinue Bladder scans  -continue  flomax to 0.8 mg daily - continue  home finasteride - monitor UOP - f/u Cr - cont home lisinopril   Transaminitis (Improved) Last LFTs reassuring and down trended. This is likely 2/2 to COVID infection/sepsis.  -no further evaluation at this time    Elevated CK improved Normal UOP. Likely improving from recent hospitalization - monitor UOP   CAD, previous MI: Chronic, stable. Previous STEMI in 2016 and MI in 2012 per chart review.  His home medications include managed on lisinopril, nebivolol, aspirin, and Crestor at home. Unsure if patient has been taking beta blocker at home.  - continue home ASA and statin   Previous CVA: Chronic, stable. No known residual deficits.  Per chart review appears to be on Plavix, however was not on his outpatient medication list and his wife has been unclear about his medications.  We were not able to clarify this on his last hospitalization, will discuss as his mental status improves.  Recent brain MRI on 1/5 without acute intracranial abnormalities. -continue  ASA and statin   Hypertension: Chronic, stable. BP mildly elevated at 149/78. On home lisinopril - monitor BP -continue home lisinopril    Hyperlipidemia: Chronic, stable. Takes rosuvastatin at home for secondary prevention. -continue statin given improvement in LFTs and CK   Depression/anxiety:  Chronic, stable. He is on numerous medications for this including Wellbutrin, Xanax, hydroxyzine, Restoril.   -Hold home medications above as could have contributed to altered mental status   Back pain/degenerative disc disease:  avoiding opioids at this time  - Tylenol and ibuprofen prn  pain    FEN/GI: HHD PPx: Lovenox per pharmacy  Disposition: discharge to SNF per PT/OT recommendations, patient medically ready for discharge   Subjective:  Patient reports pain with deep inspiration but denies chest pain or chest tightness. Patient remains stable on RA. No complaints of pain, from mental status perspective, patient is alert and oriented to person, place, city, state and year but not president. Patient was undressed when I walked into room and he was unable to answer whether he had removed his gown.    Objective: Temp:  [98.2 F (36.8 C)-98.4 F (36.9 C)] 98.4 F (36.9 C) (01/17 0823) Pulse Rate:  [66-88] 77 (01/17 0823) Resp:  [16-20] 18 (01/17 0823) BP: (149-173)/(69-89) 173/81 (01/17 0823) SpO2:  [97 %-99 %] 99 % (01/17 [id number])  Physical Exam: General: male appearing stated age in NAD lying in bed  Cardiovascular: normal s1 and s2, no murmurs or gallops or friction rubs  Respiratory: no wheezing appreciated on pulmonary exam, no increased WOB, patient stable on RA, no retractions noted   Abdomen: abdomen still soft and NT, occasional bowel sounds appreciated  Extremities: no LE edema, left hip wound dressing dry and intact, no warmth to touch in surrounding area  Neuro: alert, oriented to person, location, city and state and year, not to president, follows commands but still confused intermittently during conversation and unable to remember recent details  Laboratory: Recent Labs  Lab 05/28/19 0309 05/29/19 1047 05/30/19 0417  WBC 14.6* 13.7* 13.1*  HGB 14.8 14.0 14.3  HCT 43.4 41.6 42.0  PLT 354 276 318   Recent Labs  Lab 05/27/19 0445 05/27/19 0445 05/28/19 0309 05/29/19 1047 05/30/19 0417  NA 137   < > 134* 132* 135  K 3.9   < > 3.2* 3.3* 3.4*  CL 103   < > 100 97* 99  CO2 27   < > 26 23 24   BUN 21   < > 16 15 12   CREATININE 0.79   < > 0.68 0.48* 0.49*  CALCIUM 8.4*   < > 8.1* 8.1* 8.2*  PROT 5.8*  --  5.6* 5.6*  --   BILITOT 1.3*  --  1.1  1.0  --   ALKPHOS 61  --  55 47  --   ALT 153*  --  116* 74*  --   AST 78*  --  48* 27  --   GLUCOSE 109*   < > 111* 93 94   < > = values in this interval not displayed.    Imaging/Diagnostic Tests: No results found.  Stark Klein, MD 05/30/2019, 10:00 AM PGY-3, [place] Intern pager: [phone], text pages welcome

## 2019-05-31 LAB — CBC WITH DIFFERENTIAL/PLATELET
Abs Immature Granulocytes: 0.08 10*3/uL — ABNORMAL HIGH (ref 0.00–0.07)
Basophils Absolute: 0 10*3/uL (ref 0.0–0.1)
Basophils Relative: 0 %
Eosinophils Absolute: 0 10*3/uL (ref 0.0–0.5)
Eosinophils Relative: 0 %
HCT: 41.7 % (ref 39.0–52.0)
Hemoglobin: 14.5 g/dL (ref 13.0–17.0)
Immature Granulocytes: 1 %
Lymphocytes Relative: 4 %
Lymphs Abs: 0.6 10*3/uL — ABNORMAL LOW (ref 0.7–4.0)
MCH: 31.9 pg (ref 26.0–34.0)
MCHC: 34.8 g/dL (ref 30.0–36.0)
MCV: 91.6 fL (ref 80.0–100.0)
Monocytes Absolute: 0.9 10*3/uL (ref 0.1–1.0)
Monocytes Relative: 7 %
Neutro Abs: 11.7 10*3/uL — ABNORMAL HIGH (ref 1.7–7.7)
Neutrophils Relative %: 88 %
Platelets: 395 10*3/uL (ref 150–400)
RBC: 4.55 MIL/uL (ref 4.22–5.81)
RDW: 12.3 % (ref 11.5–15.5)
WBC: 13.3 10*3/uL — ABNORMAL HIGH (ref 4.0–10.5)
nRBC: 0 % (ref 0.0–0.2)

## 2019-05-31 LAB — BASIC METABOLIC PANEL
Anion gap: 14 (ref 5–15)
BUN: 14 mg/dL (ref 8–23)
CO2: 24 mmol/L (ref 22–32)
Calcium: 8.3 mg/dL — ABNORMAL LOW (ref 8.9–10.3)
Chloride: 96 mmol/L — ABNORMAL LOW (ref 98–111)
Creatinine, Ser: 0.69 mg/dL (ref 0.61–1.24)
GFR calc Af Amer: >60 mL/min (ref >60)
GFR calc non Af Amer: >60 mL/min (ref >60)
Glucose, Bld: 102 mg/dL — ABNORMAL HIGH (ref 70–99)
Potassium: 3.6 mmol/L (ref 3.5–5.1)
Sodium: 134 mmol/L — ABNORMAL LOW (ref 135–145)

## 2019-05-31 LAB — D-DIMER, QUANTITATIVE: D-Dimer, Quant: 3.33 ug/mL-FEU — ABNORMAL HIGH (ref 0.00–0.50)

## 2019-05-31 LAB — CULTURE, BLOOD (ROUTINE X 2): Culture: NO GROWTH

## 2019-05-31 LAB — C-REACTIVE PROTEIN: CRP: 7.8 mg/dL — ABNORMAL HIGH (ref <1.0)

## 2019-05-31 LAB — SARS CORONAVIRUS 2 (TAT 6-24 HRS): SARS Coronavirus 2: NEGATIVE

## 2019-05-31 MED ORDER — DICLOFENAC SODIUM 1 % EX GEL
2.0000 g | Freq: Four times a day (QID) | CUTANEOUS | Status: DC
  Administered 2019-05-31 – 2019-06-09 (×38): 2 g via TOPICAL
  Filled 2019-05-31 (×3): qty 100

## 2019-05-31 MED ORDER — AMLODIPINE BESYLATE 5 MG PO TABS
5.0000 mg | ORAL_TABLET | Freq: Every day | ORAL | Status: DC
  Administered 2019-05-31 – 2019-06-03 (×4): 5 mg via ORAL
  Filled 2019-05-31 (×4): qty 1

## 2019-05-31 NOTE — Consult Note (Signed)
   [facility] CM Inpatient Consult   05/31/2019  [facility] 02/07/1946 [id number]   Follow up: [place] with inpatient [facility] RN regarding the patient's current primary care provider was endorsed that the patient is now a patient at [facility] and this is "not' currently a [facility] [employer] fund[facility].  Plan: Will sign off.  Natividad Brood, RN BSN [facility] Liaison  [phone] business mobile phone Toll free office [phone]  Fax number: [phone] [email or web address]

## 2019-05-31 NOTE — TOC Progression Note (Signed)
Transition of Care [facility]) - Progression Note    Patient Details  Name: Ivan [name] MRN: [id number] Date of Birth: [birth year]  Transition of Care [facility]) CM/SW [place], LCSW Phone Number: 05/31/2019, 5:37 PM  Clinical Narrative:    [facility] able to accept patient but spouse wanted to see if [facility] could accept him since she reports that is where he contracted COVID. [facility] reviewing for the possibility.    Expected Discharge Plan: [place] Barriers to Discharge: Continued Medical Work up  Expected Discharge Plan and Services Expected Discharge Plan: [place] arrangements for the past 2 months: Single Family Home                                       Social Determinants of Health (SDOH) Interventions    Readmission Risk Interventions No flowsheet data found.

## 2019-05-31 NOTE — Progress Notes (Signed)
Family Medicine Teaching Service Daily Progress Note Intern Pager: [phone]  Patient name: Ivan [name] Medical record number: [id number] Date of birth: 1945-11-01 Age: 74 y.o. Gender: male  Primary Care Provider: Sherald Hess., MD Consultants: Wound care, general surgery Code Status: Full code  Pt Overview and Major Events to Date:  1/13: Patient admitted for altered mental status and worsening pressure injury to left hip, started on vancomycin and cefepime, Lovenox at PE treatment level  Assessment and Plan: Ivan [name] is a 74 y.o. male admitted for AMS concerning for sepsis and COVID + with worsening pressure injury to left hip. PMH is significant for HTN, HLD, CAD, +COVID-19.  AMS  Sepsis  Patient's mental status improved, alert and oriented to person, place, city, state, today patient is oriented to president elect.  Was able to observe patient while conversing with his wife on the phone overnight, and patient was able to carry on a coherent conversation.  This morning patient responsive majority of questions appropriately however does seem to have some component of underlying dementia as he has unrelated responses to certain questions such as when asked if he can take a deep breath patient responds "I suppose I could" and then does not take deep breaths.  However, patient will follow commands when asked to do it directly. Vitals remain stable and afebrile since 1/13. WBC downtrending since adm, now 13.3.   -PT/OT : recommending SNF -SW consulted for SNF placement, patient prefers [facility] at this point in time   Necrotic Left Hip Pressure Ulcer  General surgery consulted and softening escar. Clinically, does not appear to be infected On exam, eschar is undressed and open to air, continues to have narrow region of erythema just along the edges and eschar does not look to have changed any in appearance and is still intact and firm, induration noted just lateral to the  edge of wound.  There is no drainage. - Ibuoprofen and tylenol prn pain - General surgery recommendations appreciated: Wet-to-dry dressing and attempt to slough off eschar  Hypokalemia  K 3.6, within normal limits -Trend K - replete as necessary   Elevated D-dimer (improving) D-dimer today 3.33 from 2.7, Likely in setting of COVID19 infection. LEx dopplers negative for DVT.    COVID + Patient positive for COVID -19 on 05/18/19, however approximately started developing symptoms on 1/2.  Received approximately 4 days of remdesivir and 1 day of IV dexamethasone in his recent admission. SORA at 99% oxygen saturation -continue air borne and droplet precautions  -monitor respiratory status    AKI (resolved)  Acute urinary rention likely secondary to BPH Cr 0.69. Able to tolerate PO. Started on flomax 1/15, restarted home finasteride as well on 1/16.  Monitor UOP. Urine output 944mL overnight.  -continue flomax to 0.8 mg daily - continue  home finasteride - monitor UOP - f/u Cr - cont home lisinopril   Transaminitis (Improved) Last LFTs reassuring and down trended. This is likely 2/2 to COVID infection/sepsis.  -no further evaluation at this time    Elevated CK improved Normal UOP. Likely improving from recent hospitalization - monitor UOP   CAD, previous MI: Chronic, stable. Previous STEMI in 2016 and MI in 2012 per chart review.  His home medications include managed on lisinopril, nebivolol, aspirin, and Crestor at home. Unsure if patient has been taking beta blocker at home.  - continue home ASA and statin   Previous CVA: Chronic, stable. No known residual deficits.  Per chart  review appears to be on Plavix, however was not on his outpatient medication list and his wife has been unclear about his medications.  We were not able to clarify this on his last hospitalization, will discuss as his mental status improves.  Recent brain MRI on 1/5 without acute intracranial  abnormalities. -continue  ASA and statin   Hypertension: Chronic, stable. BP mildly elevated at 180/89. On home lisinopril at maximum dose.  Concerned that this elevated blood pressure may be secondary to increased pain, refer to pain management plan as below -Start amlodipine 5 mg - monitor BP -continue home lisinopril    Hyperlipidemia: Chronic, stable. Takes rosuvastatin at home for secondary prevention. -continue statin given improvement in LFTs and CK   Depression/anxiety: Chronic, stable. He is on numerous medications for this including Wellbutrin, Xanax, hydroxyzine, Restoril.   -Hold home medications above as could have contributed to altered mental status   Back pain/degenerative disc disease:  avoiding opioids at this time  - Tylenol and ibuprofen prn pain  -Voltaren gel and K pad added   FEN/GI: HHD PPx: Lovenox per pharmacy  Disposition: discharge to SNF per PT/OT recommendations, patient medically ready for discharge   Subjective:  Patient reports that his breathing feels well today and he continues to have back pain.  Patient states that his back pain is "180/10".  Patient denies any chest pain or shortness of breath.  Noticed that patient was completely undressed and asked if he was hot to which the patient responded no.  Objective: Temp:  [98 F (36.7 C)-98.9 F (37.2 C)] 98 F (36.7 C) (01/18 [id number]) Pulse Rate:  [73-99] 99 (01/18 0642) Resp:  [18-20] 20 (01/18 0642) BP: (160-180)/(80-89) 180/89 (01/18 0642) SpO2:  [95 %-99 %] 95 % (01/18 [id number])  Physical Exam: General: Male appearing stated age in no acute distress lying in bed and completely undressed Cardiovascular: Regular rate and rhythm with normal S1 and S2 no gallops or friction rub Respiratory: Patient is stable on room air, no crackles or wheezing appreciated Abdomen: Abdomen is soft and nontender with bowel sounds present Extremities: No lower extremity edema, left heel wound is undressed,  continues to have intact eschar, no drainage from this wound minimal and narrow erythema surrounding the edge of wound that is unchanged from previous exam, no warmth to touch induration noted on edges of wound   neuro: Patient is alert and oriented to self, location, city, state, year and to president today which is improved from yesterday, however note that patient does have some inappropriate responses to questions at times but majority of time answers appropriately when redirected  Laboratory: Recent Labs  Lab 05/29/19 1047 05/30/19 0417 05/31/19 0705  WBC 13.7* 13.1* 13.3*  HGB 14.0 14.3 14.5  HCT 41.6 42.0 41.7  PLT 276 318 395   Recent Labs  Lab 05/27/19 0445 05/27/19 0445 05/28/19 0309 05/28/19 0309 05/29/19 1047 05/30/19 0417 05/31/19 0705  NA 137   < > 134*   < > 132* 135 134*  K 3.9   < > 3.2*   < > 3.3* 3.4* 3.6  CL 103   < > 100   < > 97* 99 96*  CO2 27   < > 26   < > 23 24 24   BUN 21   < > 16   < > 15 12 14   CREATININE 0.79   < > 0.68   < > 0.48* 0.49* 0.69  CALCIUM 8.4*   < > 8.1*   < >  8.1* 8.2* 8.3*  PROT 5.8*  --  5.6*  --  5.6*  --   --   BILITOT 1.3*  --  1.1  --  1.0  --   --   ALKPHOS 61  --  55  --  47  --   --   ALT 153*  --  116*  --  74*  --   --   AST 78*  --  48*  --  27  --   --   GLUCOSE 109*   < > 111*   < > 93 94 102*   < > = values in this interval not displayed.    Imaging/Diagnostic Tests: No results found.  Stark Klein, MD 05/31/2019, 9:37 AM PGY-3, [place] Intern pager: [phone], text pages welcome

## 2019-05-31 NOTE — Progress Notes (Signed)
Subjective: CC: No new complaints overnight. Nurse reports that he removed his wtd dressing this am.   Objective: Vital signs in last 24 hours: Temp:  [98 F (36.7 C)-98.9 F (37.2 C)] 98 F (36.7 C) (01/18 0642) Pulse Rate:  [73-99] 99 (01/18 0642) Resp:  [18-20] 20 (01/18 0642) BP: (160-180)/(80-89) 180/89 (01/18 0642) SpO2:  [95 %-99 %] 95 % (01/18 0642) Last BM Date: 05/27/19  Intake/Output from previous day: 01/17 0701 - 01/18 0700 In: 25 [P.O.:25] Out: 900 [Urine:900] Intake/Output this shift: No intake/output data recorded.  PE: Gen: NAD, comfortable CV: RRR Pulm: Normal work of breathing; not on oxygen presently Left hip area - stable appearing eschar that remains firm - no signfiicant surrounding erythema or drainage. No fluctuance. See picture below       Lab Results:  Recent Labs    05/30/19 0417 05/31/19 0705  WBC 13.1* 13.3*  HGB 14.3 14.5  HCT 42.0 41.7  PLT 318 395   BMET Recent Labs    05/30/19 0417 05/31/19 0705  NA 135 134*  K 3.4* 3.6  CL 99 96*  CO2 24 24  GLUCOSE 94 102*  BUN 12 14  CREATININE 0.49* 0.69  CALCIUM 8.2* 8.3*   PT/INR No results for input(s): LABPROT, INR in the last 72 hours. CMP     Component Value Date/Time   NA 134 (L) 05/31/2019 0705   NA 144 06/16/2015 0837   K 3.6 05/31/2019 0705   CL 96 (L) 05/31/2019 0705   CO2 24 05/31/2019 0705   GLUCOSE 102 (H) 05/31/2019 0705   BUN 14 05/31/2019 0705   BUN 22 06/16/2015 0837   CREATININE 0.69 05/31/2019 0705   CREATININE 0.91 02/20/2018 1022   CALCIUM 8.3 (L) 05/31/2019 0705   PROT 5.6 (L) 05/29/2019 1047   PROT 6.0 06/16/2015 0837   ALBUMIN 2.3 (L) 05/29/2019 1047   ALBUMIN 4.2 06/16/2015 0837   AST 27 05/29/2019 1047   ALT 74 (H) 05/29/2019 1047   ALKPHOS 47 05/29/2019 1047   BILITOT 1.0 05/29/2019 1047   BILITOT 0.5 06/16/2015 0837   GFRNONAA >60 05/31/2019 0705   GFRNONAA 86 02/20/2018 1022   GFRAA >60 05/31/2019 0705   GFRAA 99  02/20/2018 1022   Lipase  No results found for: LIPASE     Studies/Results: No results found.  Anti-infectives: Anti-infectives (From admission, onward)   Start     Dose/Rate Route Frequency Ordered Stop   05/29/19 1330  ceFEPIme (MAXIPIME) 2 g in sodium chloride 0.9 % 100 mL IVPB  Status:  Discontinued     2 g 200 mL/hr over 30 Minutes Intravenous Every 8 hours 05/29/19 0916 05/29/19 1123   05/29/19 1100  metroNIDAZOLE (FLAGYL) IVPB 500 mg  Status:  Discontinued     500 mg 100 mL/hr over 60 Minutes Intravenous Every 8 hours 05/29/19 0916 05/29/19 1123   05/27/19 1845  vancomycin (VANCOCIN) IVPB 1000 mg/200 mL premix  Status:  Discontinued     1,000 mg 200 mL/hr over 60 Minutes Intravenous Every 12 hours 05/27/19 1219 05/29/19 1123   05/26/19 2200  ceFEPIme (MAXIPIME) 2 g in sodium chloride 0.9 % 100 mL IVPB  Status:  Discontinued     2 g 200 mL/hr over 30 Minutes Intravenous Every 8 hours 05/26/19 1852 05/29/19 0913   05/26/19 1900  metroNIDAZOLE (FLAGYL) IVPB 500 mg  Status:  Discontinued     500 mg 100 mL/hr over 60 Minutes Intravenous Every 8  hours 05/26/19 1849 05/29/19 0913   05/26/19 1845  vancomycin (VANCOREADY) IVPB 1250 mg/250 mL  Status:  Discontinued     1,250 mg 166.7 mL/hr over 90 Minutes Intravenous Every 12 hours 05/26/19 1841 05/27/19 1219       Assessment/Plan HTN HLD CAD s/p CABG 2005 H/o CVA BPH Depression/PTSD COVID-19+ AMS, Sepsis Elevated CK/rhabdomyolysis  Necrotic left hip wound -Recommend covering with wet to dry dressing (moist 4x4), changing twice daily -Given recent COVID diagnosis, general anesthesia does carry significant pulmary risks including potential mortality. Currently this appears to be dry and with wet to dry dressing changes should slough - would recommend attempt at this first; if not cleaning up, would need pulmonary eval/clearance prior to any attempted surgical debridement as he does not appear to be able to tolerate a  bedside debridement - Will see M/W/F  ID - maxipime/vancomycin/flagyl 1/13-1/16 VTE - SCDs, lovenox FEN - HH Foley - none Follow up - TBD    LOS: 5 days    Jillyn Ledger , [facility] 05/31/2019, 9:49 AM Please see Amion for pager number during day hours 7:00am-4:30pm

## 2019-05-31 NOTE — Progress Notes (Signed)
Updated wife on plan of care, answered questions and concerns. Let team know that wife pushing for second covid test on her husband.

## 2019-05-31 NOTE — Plan of Care (Signed)
  Problem: Education: °Goal: Knowledge of risk factors and measures for prevention of condition will improve °Outcome: Not Progressing °  °

## 2019-06-01 LAB — CBC WITH DIFFERENTIAL/PLATELET
Abs Immature Granulocytes: 0.07 10*3/uL (ref 0.00–0.07)
Basophils Absolute: 0 10*3/uL (ref 0.0–0.1)
Basophils Relative: 0 %
Eosinophils Absolute: 0.1 10*3/uL (ref 0.0–0.5)
Eosinophils Relative: 0 %
HCT: 45 % (ref 39.0–52.0)
Hemoglobin: 15.6 g/dL (ref 13.0–17.0)
Immature Granulocytes: 1 %
Lymphocytes Relative: 5 %
Lymphs Abs: 0.7 10*3/uL (ref 0.7–4.0)
MCH: 31.7 pg (ref 26.0–34.0)
MCHC: 34.7 g/dL (ref 30.0–36.0)
MCV: 91.5 fL (ref 80.0–100.0)
Monocytes Absolute: 1.1 10*3/uL — ABNORMAL HIGH (ref 0.1–1.0)
Monocytes Relative: 8 %
Neutro Abs: 11.7 10*3/uL — ABNORMAL HIGH (ref 1.7–7.7)
Neutrophils Relative %: 86 %
Platelets: 376 10*3/uL (ref 150–400)
RBC: 4.92 MIL/uL (ref 4.22–5.81)
RDW: 12.3 % (ref 11.5–15.5)
WBC: 13.6 10*3/uL — ABNORMAL HIGH (ref 4.0–10.5)
nRBC: 0 % (ref 0.0–0.2)

## 2019-06-01 LAB — BASIC METABOLIC PANEL
Anion gap: 11 (ref 5–15)
BUN: 11 mg/dL (ref 8–23)
CO2: 28 mmol/L (ref 22–32)
Calcium: 8.6 mg/dL — ABNORMAL LOW (ref 8.9–10.3)
Chloride: 96 mmol/L — ABNORMAL LOW (ref 98–111)
Creatinine, Ser: 0.64 mg/dL (ref 0.61–1.24)
GFR calc Af Amer: >60 mL/min (ref >60)
GFR calc non Af Amer: >60 mL/min (ref >60)
Glucose, Bld: 109 mg/dL — ABNORMAL HIGH (ref 70–99)
Potassium: 3.3 mmol/L — ABNORMAL LOW (ref 3.5–5.1)
Sodium: 135 mmol/L (ref 135–145)

## 2019-06-01 LAB — MAGNESIUM: Magnesium: 2 mg/dL (ref 1.7–2.4)

## 2019-06-01 LAB — D-DIMER, QUANTITATIVE: D-Dimer, Quant: 2.86 ug/mL-FEU — ABNORMAL HIGH (ref 0.00–0.50)

## 2019-06-01 MED ORDER — POTASSIUM CHLORIDE CRYS ER 20 MEQ PO TBCR
40.0000 meq | EXTENDED_RELEASE_TABLET | Freq: Two times a day (BID) | ORAL | Status: AC
  Administered 2019-06-01 (×2): 40 meq via ORAL
  Filled 2019-06-01 (×2): qty 2

## 2019-06-01 MED ORDER — POTASSIUM CHLORIDE CRYS ER 20 MEQ PO TBCR: 40.0000 meq | EXTENDED_RELEASE_TABLET | Freq: Two times a day (BID) | ORAL | Status: DC

## 2019-06-01 NOTE — Progress Notes (Signed)
Family Medicine Teaching Service Daily Progress Note Intern Pager: [phone]  Patient name: Ivan [name] Medical record number: [id number] Date of birth: January 21, 1946 Age: 74 y.o. Gender: male  Primary Care Provider: Sherald Hess., MD Consultants: Wound care, general surgery Code Status: Full code  Pt Overview and Major Events to Date:  1/13: Patient admitted for altered mental status and worsening pressure injury to left hip, started on vancomycin and cefepime, Lovenox at PE treatment level  Assessment and Plan: Ivan [name] is a 74 y.o. male admitted for AMS concerning for sepsis and COVID + with worsening pressure injury to left hip. PMH is significant for HTN, HLD, CAD, +COVID-19.  AMS  Sepsis  Patient's mental status improved, alert and oriented to person, place, city, state, and patient is oriented to president elect. Patient understanding of situation of why he is in the hospital and shared updates on his lab work. Patient medically stable for discharge to SNF -PT/OT : recommending SNF -SW consulted for SNF placement, patient's spouse requesting placement at [facility], in review    Necrotic Left Hip Pressure Ulcer  General surgery consulted and softening eschar with no plan for acute debridement at this time.  - Ibuoprofen and tylenol prn pain - General surgery recommendations appreciated: Wet-to-dry dressing and attempt to slough off eschar  Hypokalemia  K 3.3, low  -Trend K - repleted with 43mEq BID   -will check magnesium   Elevated D-dimer (improving) D-dimer today 2.86 from 3.33, Likely in setting of COVID19 infection. LEx dopplers negative for DVT. Repeat covid negative so expect this to continue to downtrend.    COVID +, Resolved  -Repeat covid test on 1/18 negative   -SORA  -discontinue air borne and droplet precautions  -monitor respiratory status    AKI (resolved)  Acute urinary rention likely secondary to BPH Cr 0.64. Able to tolerate PO.  Started on flomax 1/15, restarted home finasteride as well on 1/16.  No urinary retention signs.   -continue flomax to 0.8 mg daily - continue  home finasteride - cont home lisinopril   Transaminitis (Improved) Last LFTs reassuring and down trended. This is likely 2/2 to COVID infection/sepsis.  -no further evaluation at this time    Elevated CK improved Normal UOP. Likely improving from recent hospitalization - monitor UOP   CAD, previous MI: Chronic, stable. Previous STEMI in 2016 and MI in 2012 per chart review.  His home medications include managed on lisinopril, nebivolol, aspirin, and Crestor at home. Unsure if patient has been taking beta blocker at home.  - continue home ASA and statin   Previous CVA: Chronic, stable. No known residual deficits.  Per chart review appears to be on Plavix, however was not on his outpatient medication list and his wife has been unclear about his medications.  We were not able to clarify this on his last hospitalization, will discuss as his mental status improves.  Recent brain MRI on 1/5 without acute intracranial abnormalities. -continue  ASA and statin   Hypertension: Chronic, stable. BP improved to 154/78. On home lisinopril at maximum dose.  Added Amlodipine 5. -continue amlodipine 5 mg - monitor BP -continue home lisinopril    Hyperlipidemia: Chronic, stable. Takes rosuvastatin at home for secondary prevention. -continue statin    Depression/anxiety: Chronic, stable. He is on numerous medications for this including Wellbutrin, Xanax, hydroxyzine, Restoril.   -Hold home medications above as could have contributed to altered mental status   Back pain/degenerative disc disease:  avoiding opioids  at this time  - Tylenol and ibuprofen prn pain  -Voltaren gel and K pad   FEN/GI: HHD PPx: Lovenox   Disposition: discharge to SNF pending acceptance, patient is medically stable for discharge   Subjective:  Patient reports no respiratory  complaints and continues to be stable on RA. Patient inquires as to why he could not go home instead of SNF since recent COVID test was negative. I encouraged SNF as it is recommended by PT/OT for his safety. Patient the goes on to state that he has been unable to bear weight on his legs for years. When asked how he gets around his home, patient states that he doesn't. I expressed concern for this and emphasized how rehabilitation would be helpful for that reason.   Objective: Temp:  [98 F (36.7 C)-98.3 F (36.8 C)] 98.3 F (36.8 C) (01/19 0611) Pulse Rate:  [66-94] 66 (01/19 0611) Resp:  [18] 18 (01/19 0611) BP: (134-154)/(70-86) 151/70 (01/19 0611) SpO2:  [94 %-97 %] 96 % (01/19 [id number])  Physical Exam: General:male who is non toxic appearing lying in bed Cardiovascular: RRR without murmurs, gallops or friction rubs  Respiratory: CTAB without wheezing, no crackles, no wheezing, SORA Abdomen: soft, NT, minimal bowel sounds  Extremities: no LE edema, patient moves both LE  neuro: alert and oriented to person, place, location, city, state and situation. Patient appears to respond more clearly to questions today.   Laboratory: Recent Labs  Lab 05/30/19 0417 05/31/19 0705 06/01/19 0414  WBC 13.1* 13.3* 13.6*  HGB 14.3 14.5 15.6  HCT 42.0 41.7 45.0  PLT 318 395 376   Recent Labs  Lab 05/27/19 0445 05/27/19 0445 05/28/19 0309 05/28/19 0309 05/29/19 1047 05/29/19 1047 05/30/19 0417 05/31/19 0705 06/01/19 0414  NA 137   < > 134*   < > 132*   < > 135 134* 135  K 3.9   < > 3.2*   < > 3.3*   < > 3.4* 3.6 3.3*  CL 103   < > 100   < > 97*   < > 99 96* 96*  CO2 27   < > 26   < > 23   < > 24 24 28   BUN 21   < > 16   < > 15   < > 12 14 11   CREATININE 0.79   < > 0.68   < > 0.48*   < > 0.49* 0.69 0.64  CALCIUM 8.4*   < > 8.1*   < > 8.1*   < > 8.2* 8.3* 8.6*  PROT 5.8*  --  5.6*  --  5.6*  --   --   --   --   BILITOT 1.3*  --  1.1  --  1.0  --   --   --   --   ALKPHOS 61  --  55  --   47  --   --   --   --   ALT 153*  --  116*  --  74*  --   --   --   --   AST 78*  --  48*  --  27  --   --   --   --   GLUCOSE 109*   < > 111*   < > 93   < > 94 102* 109*   < > = values in this interval not displayed.    Imaging/Diagnostic Tests: No results found.  Stark Klein, MD 06/01/2019,  9:44 AM PGY-3, [place] Intern pager: [phone], text pages welcome

## 2019-06-01 NOTE — TOC Progression Note (Signed)
Transition of Care [facility]) - Progression Note    Patient Details  Name: Ivan [name] MRN: [id number] Date of Birth: 02-08-1946  Transition of Care [facility]) CM/SW [place], LCSW Phone Number: 06/01/2019, 9:12 AM  Clinical Narrative:    Claudina Lick requesting updated PT note and aware that patient tested negative for COVID yesterday. They will let CSW know their final decision.    Expected Discharge Plan: [place] Barriers to Discharge: Continued Medical Work up  Expected Discharge Plan and Services Expected Discharge Plan: [place] arrangements for the past 2 months: Single Family Home                                       Social Determinants of Health (SDOH) Interventions    Readmission Risk Interventions No flowsheet data found.

## 2019-06-01 NOTE — TOC Progression Note (Signed)
Transition of Care [facility]) - Progression Note    Patient Details  Name: [name] MRN: [id number] Date of Birth: May 16, 1945  Transition of Care [facility]) CM/SW [place], LCSW Phone Number: 06/01/2019, 4:39 PM  Clinical Narrative:    CSW updated patient's spouse that [facility] is not able to accept patient. Additionally, [facility] does not have a bed available. [facility] not able to accept patient due to care level. CSW sent Medicare ratings list by email. No other bed offers available. Spouse is discussing other facilities with the patient.    Expected Discharge Plan: [place] Barriers to Discharge: Continued Medical Work up  Expected Discharge Plan and Services Expected Discharge Plan: [place] arrangements for the past 2 months: Single Family Home                                       Social Determinants of Health (SDOH) Interventions    Readmission Risk Interventions No flowsheet data found.

## 2019-06-01 NOTE — Progress Notes (Signed)
Physical Therapy Treatment Patient Details Name: Ivan [name] MRN: [id number] DOB: [birth year] Today's Date: 06/01/2019    History of Present Illness  74 y.o. male admitted on 05/26/2019 for worsening wound and AMS. Prior admission (COVID+) with UTI and fall with left hip contusion; pt leaving hospital AMA on 05/22/2019. PMH including HTN, HLD, depression, DDD, CABG x4, PTSD, and CAD.    PT Comments    Pt initially refusing getting out of bed with therapy. Pt unaware his bed and gown were saturated. When pt told bed was wet and the need for moving to get dry sheet he did not think that it was necessary. 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 Pt unable to make connection to his own well being. Given pt decreased insight into his own safety, and progressively worse cognition in light of his prior level of function recommend psych consult. Pt is total A for bed mobility and maxAx2 for sit>stand transfer for placement of clean sheets. PT continues to recommend SNF level care at discharge. PT will continue to follow acutely.    Follow Up Recommendations  SNF     Equipment Recommendations  None recommended by PT       Precautions / Restrictions Precautions Precautions: Fall Precaution Comments: severe back pain    Mobility  Bed Mobility Overal bed mobility: Needs Assistance Bed Mobility: Rolling;Sidelying to Sit Rolling: Total assist;+2 for physical assistance   Supine to sit: +2 for physical assistance;+2 for safety/equipment;Total assist   Sit to sidelying: Total assist;+2 for physical assistance;+2 for safety/equipment General bed mobility comments: Pt very resistive to movement, he briefly was agreeble, but then began flailing arms and legs when moved toward EOB.  He demonstrated signifcant posterior pushing.     Transfers Overall transfer level: Needs assistance Equipment used: 2 person hand held assist Transfers: Sit to/from Stand Sit to Stand: Max assist;+2 physical assistance;+2 safety/equipment         General transfer comment: Pt spontaneously pushed into standing by pushing heavily posteriorly.          Balance Overall balance assessment: Needs assistance Sitting-balance support: Feet unsupported;No upper extremity supported Sitting balance-Leahy Scale: Poor Sitting balance - Comments: Pt requires max A +2 to sit EOB.  Pt flailing UEs and LEs as well as puishing heavily posteriorly    Standing balance support: Bilateral upper extremity supported;During functional activity Standing balance-Leahy Scale: Poor Standing balance comment: Pt requires max A +2.  He only will stand brielfy, and demonstrates heavy posterior pushing.                             Cognition Arousal/Alertness: Awake/alert Behavior During Therapy: Anxious;Impulsive;Restless Overall Cognitive Status: Impaired/Different from baseline Area of Impairment: Orientation;Attention;Memory;Following commands;Safety/judgement;Awareness;Problem solving                 Orientation Level: Disoriented to;Time;Situation Current Attention Level: Focused Memory: Decreased short-term memory Following Commands: Follows one step commands inconsistently;Follows one step commands with increased time Safety/Judgement: Decreased awareness of deficits;Decreased awareness of safety   Problem Solving: Slow processing;Decreased initiation;Difficulty sequencing;Requires verbal cues;Requires tactile cues General Comments: Pt very confused, confabulatory, and tangential.  He is able to provide some info re: past life situation, but not able to focus on current situation  and events          General Comments General comments (skin integrity, edema, etc.): CNA in room to assist with peri care.        Pertinent  Vitals/Pain Pain Assessment: Faces Pain Score: 6  Pain Location: back Pain Descriptors / Indicators: Grimacing;Guarding;Restless;Moaning Pain Intervention(s): Limited activity within patient's tolerance;Monitored during session;Repositioned           PT Goals (current goals can now be found in the care plan section) Acute Rehab PT Goals Patient Stated Goal: to walk with my special walker  PT Goal Formulation: Patient unable to participate in goal setting Time For Goal Achievement: 06/10/19 Potential to Achieve Goals: Fair Progress towards PT goals: Progressing toward goals    Frequency    Min 2X/week      PT Plan Current plan remains appropriate    Co-evaluation PT/OT/SLP Co-Evaluation/Treatment: Yes Reason for Co-Treatment: Complexity of the patient's impairments (multi-system involvement) PT goals addressed during session: Mobility/safety with mobility OT goals addressed during session: ADL's and self-care      AM-PAC PT "6 Clicks" Mobility   Outcome Measure  Help needed turning from your back to your side while in a flat bed without using bedrails?: A Little Help needed moving from lying on your back to sitting on the side of a flat bed without using bedrails?: Total Help needed moving to and from a bed to a chair (including a wheelchair)?: Total Help needed standing up from a chair using your arms (e.g., wheelchair or bedside chair)?: Total Help needed to walk in hospital room?: Total Help needed climbing 3-5 steps with a railing? : Total 6 Click Score: 8    End of Session   Activity Tolerance: Patient limited by pain Patient left: in bed;with call bell/phone within reach;with nursing/sitter in room Nurse Communication: Mobility status PT Visit Diagnosis: Other (comment);Muscle weakness (generalized) (M62.81);Difficulty in walking, not elsewhere classified (R26.2);Other abnormalities of gait and mobility (R26.89);Unsteadiness on feet (R26.81);History of falling  (Z91.81)     Time: [id number] PT Time Calculation (min) (ACUTE ONLY): 25 min  Charges:  $Therapeutic Activity: 8-22 mins                     Nathifa Ritthaler B. Migdalia Dk PT, DPT Acute Rehabilitation Services Pager [phone] Office [phone]    [place] 06/01/2019, 3:57 PM

## 2019-06-01 NOTE — Progress Notes (Signed)
Patient's wife was called multiple times this shift by patient's request. He wanted to talk with her and "clarify things with her".

## 2019-06-01 NOTE — Progress Notes (Signed)
Occupational Therapy Treatment Patient Details Name: Ivan [name] MRN: [id number] DOB: [birth year] Today's Date: 06/01/2019    History of present illness  74 y.o. male admitted on 05/26/2019 for worsening wound and AMS. Prior admission (COVID+) with UTI and fall with left hip contusion; pt leaving hospital AMA on 05/22/2019. PMH including HTN, HLD, depression, DDD, CABG x4, PTSD, and CAD.   OT comments  Pt seen in conjunction with PT.  He requires max A +2 - total A +2 for bed mobility, sitting EOB and standing very briefly.  He requires max - total A for all aspects of ADLs, and is significantly limited by his confusion.  He demonstrates focused attention, was able to follow simple commands inconsistently with a delay.  He is not oriented to time or situation and was confabulatory, and tangential.  He requires max - total A to problem solve through simple activities.  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  Recommend psych consult.  He will require extensive post acute rehab but cognition needs to improve for him to be able to fully participate.   Follow Up Recommendations  SNF;Supervision/Assistance - 24 hour    Equipment Recommendations  None recommended by OT    Recommendations for Other Services      Precautions / Restrictions Precautions Precautions: Fall Precaution Comments: severe back pain       Mobility Bed Mobility Overal bed mobility: Needs Assistance Bed Mobility: Rolling;Sidelying to Sit Rolling: Total assist;+2 for physical assistance   Supine to sit: +2 for physical assistance;+2 for safety/equipment;Total assist   Sit to sidelying: Total assist;+2 for physical assistance;+2 for safety/equipment General bed mobility comments: Pt very resistive to movement, he briefly was agreeble, but then began flailing arms and legs when moved toward EOB.  He demonstrated signifcant posterior  pushing.    Transfers Overall transfer level: Needs assistance Equipment used: 2 person hand held assist Transfers: Sit to/from Stand Sit to Stand: Max assist;+2 physical assistance;+2 safety/equipment         General transfer comment: Pt spontaneously pushed into standing by pushing heavily posteriorly.      Balance Overall balance assessment: Needs assistance Sitting-balance support: Feet unsupported;No upper extremity supported Sitting balance-Leahy Scale: Poor Sitting balance - Comments: Pt requires max A +2 to sit EOB.  Pt flailing UEs and LEs as well as puishing heavily posteriorly    Standing balance support: Bilateral upper extremity supported;During functional activity Standing balance-Leahy Scale: Poor Standing balance comment: Pt requires max A +2.  He only will stand brielfy, and demonstrates heavy posterior pushing.                            ADL either performed or assessed with clinical judgement   ADL Overall ADL's : Needs assistance/impaired         Upper Body Bathing: Total assistance;Bed level   Lower Body Bathing: Total assistance;Sit to/from stand;+2 for physical assistance Lower Body Bathing Details (indicate cue type and reason): Pt incontinent of urine.  Total A +2 for peri care.  Pt unable to comprehend need to move in order to be cleaned up      Lower Body Dressing: Total assistance;Sit to/from stand   Toilet Transfer: Total assistance;+2 for physical assistance Toilet Transfer Details (indicate cue type and reason): pt unable  Toileting- Clothing Manipulation and Hygiene: Total assistance  Functional mobility during ADLs: +2 for physical assistance;Maximal assistance General ADL Comments: Pt unable to sustain attention to complete task      Vision       Perception     Praxis      Cognition Arousal/Alertness: Awake/alert Behavior During Therapy: Anxious;Impulsive;Restless Overall Cognitive Status: Impaired/Different  from baseline Area of Impairment: Orientation;Attention;Memory;Following commands;Safety/judgement;Awareness;Problem solving                 Orientation Level: Disoriented to;Time;Situation Current Attention Level: Focused Memory: Decreased short-term memory Following Commands: Follows one step commands inconsistently;Follows one step commands with increased time Safety/Judgement: Decreased awareness of deficits;Decreased awareness of safety   Problem Solving: Slow processing;Decreased initiation;Difficulty sequencing;Requires verbal cues;Requires tactile cues General Comments: Pt very confused, confabulatory, and tangential.  He is able to provide some info re: past life situation, but not able to focus on current situation and events         Exercises     Shoulder Instructions       General Comments CNA in room to assist with peri care.      Pertinent Vitals/ Pain       Pain Assessment: Faces Pain Score: 6  Pain Location: back Pain Descriptors / Indicators: Grimacing;Guarding;Restless;Moaning Pain Intervention(s): Monitored during session  Home Living                                          Prior Functioning/Environment              Frequency  Min 2X/week        Progress Toward Goals  OT Goals(current goals can now be found in the care plan section)  Progress towards OT goals: Not progressing toward goals - comment(due to severity of confusion )  Acute Rehab OT Goals Patient Stated Goal: to walk with my special walker  OT Goal Formulation: With patient  Plan Discharge plan remains appropriate    Co-evaluation    PT/OT/SLP Co-Evaluation/Treatment: Yes Reason for Co-Treatment: Complexity of the patient's impairments (multi-system involvement);Necessary to address cognition/behavior during functional activity;For patient/therapist safety;To address functional/ADL transfers   OT goals addressed during session: ADL's and self-care       AM-PAC OT "6 Clicks" Daily Activity     Outcome Measure   Help from another person eating meals?: A Lot Help from another person taking care of personal grooming?: A Lot Help from another person toileting, which includes using toliet, bedpan, or urinal?: Total Help from another person bathing (including washing, rinsing, drying)?: Total Help from another person to put on and taking off regular upper body clothing?: Total Help from another person to put on and taking off regular lower body clothing?: Total 6 Click Score: 8    End of Session Equipment Utilized During Treatment: Gait belt  OT Visit Diagnosis: Unsteadiness on feet (R26.81);Other abnormalities of gait and mobility (R26.89);Muscle weakness (generalized) (M62.81);Other symptoms and signs involving cognitive function;Pain Pain - part of body: (back )   Activity Tolerance Other (comment)(limited by cognition )   Patient Left in bed;with call bell/phone within reach;with bed alarm set;with nursing/sitter in room   Nurse Communication Mobility status        Time: [id number] OT Time Calculation (min): 25 min  Charges: OT General Charges $OT Visit: 1 Visit OT Treatments $Self Care/Home Management : 8-22 mins  Nilsa Nutting., OTR/L Acute Rehabilitation Services Pager [phone] Office [phone]  Lucille Passy M 06/01/2019, 2:30 PM

## 2019-06-01 NOTE — TOC Progression Note (Addendum)
Transition of Care [facility]) - Progression Note    Patient Details  Name: Ivan [name] MRN: [id number] Date of Birth: 01/02/1946  Transition of Care [facility]) CM/SW [place], LCSW Phone Number: 06/01/2019, 11:11 AM  Clinical Narrative:    CSW received call from patient' spouse. She is requesting CIR now that patient has tested negative. Due to CIR policy of requiring two negative tests. CSW has asked them to do a chart review to see if patient is even eligible before requesting a second COVID test. They await therapy notes before reviewing. PT aware and will see patient this afternoon.    Expected Discharge Plan: [place] Barriers to Discharge: Continued Medical Work up  Expected Discharge Plan and Services Expected Discharge Plan: [place] arrangements for the past 2 months: Single Family Home                                       Social Determinants of Health (SDOH) Interventions    Readmission Risk Interventions No flowsheet data found.

## 2019-06-01 NOTE — Progress Notes (Addendum)
Patient has periods of confusion; called staff in his room and asked where he suppose to go today. When he was asked if he knows where he is and what time is he answered "it is May 02, 2012 and I am in [place], [place]". Patient was reoriented and assured that staff will informed him if he will be D/C today and where. Will continue to monitor.

## 2019-06-01 NOTE — Progress Notes (Signed)
Rehab Admissions Coordinator Note:  Per LCSW request, this patient was screened by Raechel Ache for appropriateness for an Inpatient Acute Rehab Consult.  At this time, [facility] agrees with therapy recommendations for [place]. Given the extent of pt's confusion noted during therapy sessions, doubt that he would be able to fully participate in up to 3 hours of therapy a day. SNF recommendations remain appropriate. Will not pursue consult order at this time.   Raechel Ache 06/01/2019, 2:40 PM  I can be reached at [phone].

## 2019-06-02 LAB — BASIC METABOLIC PANEL
Anion gap: 14 (ref 5–15)
BUN: 11 mg/dL (ref 8–23)
CO2: 26 mmol/L (ref 22–32)
Calcium: 8.6 mg/dL — ABNORMAL LOW (ref 8.9–10.3)
Chloride: 95 mmol/L — ABNORMAL LOW (ref 98–111)
Creatinine, Ser: 0.68 mg/dL (ref 0.61–1.24)
GFR calc Af Amer: >60 mL/min (ref >60)
GFR calc non Af Amer: >60 mL/min (ref >60)
Glucose, Bld: 100 mg/dL — ABNORMAL HIGH (ref 70–99)
Potassium: 3.5 mmol/L (ref 3.5–5.1)
Sodium: 135 mmol/L (ref 135–145)

## 2019-06-02 LAB — SARS CORONAVIRUS 2 (TAT 6-24 HRS): SARS Coronavirus 2: NEGATIVE

## 2019-06-02 LAB — CBC WITH DIFFERENTIAL/PLATELET
Abs Immature Granulocytes: 0.04 10*3/uL (ref 0.00–0.07)
Basophils Absolute: 0 10*3/uL (ref 0.0–0.1)
Basophils Relative: 0 %
Eosinophils Absolute: 0.1 10*3/uL (ref 0.0–0.5)
Eosinophils Relative: 1 %
HCT: 45.2 % (ref 39.0–52.0)
Hemoglobin: 15.3 g/dL (ref 13.0–17.0)
Immature Granulocytes: 0 %
Lymphocytes Relative: 5 %
Lymphs Abs: 0.5 10*3/uL — ABNORMAL LOW (ref 0.7–4.0)
MCH: 31.7 pg (ref 26.0–34.0)
MCHC: 33.8 g/dL (ref 30.0–36.0)
MCV: 93.6 fL (ref 80.0–100.0)
Monocytes Absolute: 0.9 10*3/uL (ref 0.1–1.0)
Monocytes Relative: 8 %
Neutro Abs: 9.3 10*3/uL — ABNORMAL HIGH (ref 1.7–7.7)
Neutrophils Relative %: 86 %
Platelets: 367 10*3/uL (ref 150–400)
RBC: 4.83 MIL/uL (ref 4.22–5.81)
RDW: 12.5 % (ref 11.5–15.5)
WBC: 10.9 10*3/uL — ABNORMAL HIGH (ref 4.0–10.5)
nRBC: 0 % (ref 0.0–0.2)

## 2019-06-02 LAB — D-DIMER, QUANTITATIVE: D-Dimer, Quant: 2.78 ug/mL-FEU — ABNORMAL HIGH (ref 0.00–0.50)

## 2019-06-02 MED ORDER — DULOXETINE HCL 30 MG PO CPEP
30.0000 mg | ORAL_CAPSULE | Freq: Every day | ORAL | Status: DC
  Administered 2019-06-02 – 2019-06-09 (×8): 30 mg via ORAL
  Filled 2019-06-02 (×8): qty 1

## 2019-06-02 NOTE — Progress Notes (Signed)
Call patient's wife, Ivan [name], in order to provide updates on patient's care.  Ms. Ivan [name] expressed concern for a lab values that she had discovered on MyChart.  Patient's wife inquired about the patient's D-dimer level and inquired if he was at increased risk for clot.  I informed her that the patient's D-dimer was elevated to 5 on admission which was expected as the patient was Covid positive at that time.  I reassured her that his admission level was 5 and had consistently down trended throughout this hospitalization to 2.76.  She countered that his level was still elevated above normal but seemed reassured after I reemphasized that the patient's respiratory status was clinically monitored and that he remained stable on room air with appropriate saturations, denied chest pain or difficulty breathing throughout this admission and had a normal respiratory rate. Ms. Ivan [name] went on to state that the patient was "for sure going to [facility]".  I reinforced the recommendation for SNF and how it would likely be a good thing for the patient to have some rehabilitation. Patient's wife is also concerned about possible infection as she noted that his white blood cell count had increased.  I reassured her that his white blood cell count was not significantly elevated and given that his wound was responding appropriately to dressing changes, he had no respiratory symptoms or requirement for supplemental oxygen and denied any pain outside of his chronic pain. All of this provided reassurance that he likely did not have an active infection.  Also reminded the patient's wife that he had been treated with antibiotics and had blood cultures that did not show any signs of infection in his urine or blood.  She seemed reassured by this and thanked me for the update.  Ivan Klein, MD  [place]   PGY-1 [place] Intern Pager [phone]

## 2019-06-02 NOTE — Progress Notes (Addendum)
Family Medicine Teaching Service Daily Progress Note Intern Pager: [phone]  Patient name: Ivan [name] Medical record number: [id number] Date of birth: [birth year] Age: 74 y.o. Gender: male  Primary Care Provider: Sherald Hess., MD Consultants: Wound care, general surgery Code Status: Full code  Pt Overview and Major Events to Date:  1/13: Patient admitted for altered mental status and worsening pressure injury to left hip, started on vancomycin and cefepime, Lovenox at PE treatment level  Assessment and Plan: Ivan [name] is a 74 y.o. male admitted for AMS concerning for sepsis and COVID + with worsening pressure injury to left hip. PMH is significant for HTN, HLD, CAD, +COVID-19.  AMS  Sepsis  Patient's mental status continues to wax and wane.  Patient notes oriented to self, state, and president but and accurately states that he is in [place].  Patient medically stable for discharge to SNF -PT/OT : recommending SNF -SW consulted for SNF placement, patient's spouse requesting placement at [facility] (no beds available),requested CIR (CIR deemed not appropriate), [facility] (not appropriate for level of care) -We will follow-up office for placement   Necrotic Left Hip Pressure Ulcer, improved General surgery consulted and softening eschar with no plan for acute debridement at this time. Wound beginning to slough, no signs of infection or worsening.  - Ibuoprofen and tylenol prn pain - General surgery recommendations appreciated: Wet-to-dry dressing and attempt to slough off eschar  Hypokalemia  K 3.3>3.5, now WNL.  -Trend K - will replenish as necessary   Elevated D-dimer (improved) D-dimer down trended consistently during this admission.   Likely due to COVID19 infection. LEx dopplers negative for DVT on admission.  D-dimer down to 2.78   COVID +, Resolved  Patient has denied any respiratory symptoms throughout this admission. -Repeat covid test on 1/18  negative   -SORA   -monitor respiratory status    AKI (resolved)  Acute urinary rention likely secondary to BPH Cr 0.64>0.68. Able to tolerate PO. Started on flomax 1/15, restarted home finasteride as well on 1/16.  No urinary retention signs.   -continue flomax to 0.8 mg daily - continue  home finasteride - cont home lisinopril   Transaminitis (Improved) Last LFTs reassuring and down trended. This is likely 2/2 to COVID infection/sepsis.  -no further evaluation at this time    Elevated CK improved Normal UOP. Likely improving from recent hospitalization - monitor UOP   CAD, previous MI: Chronic, stable. Previous STEMI in 2016 and MI in 2012 per chart review.  His home medications include managed on lisinopril, nebivolol, aspirin, and Crestor at home. Unsure if patient has been taking beta blocker at home.  - continue home ASA and statin   Previous CVA: Chronic, stable. No known residual deficits.  Per chart review appears to be on Plavix, however was not on his outpatient medication list and his wife has been unclear about his medications.  We were not able to clarify this on his last hospitalization, will discuss as his mental status improves.  Recent brain MRI on 1/5 without acute intracranial abnormalities. -continue  ASA and statin   Hypertension: Chronic, stable. BP improved to 147/80. On home lisinopril at maximum dose.  Added Amlodipine 5. -continue amlodipine 5 mg - monitor BP -continue home lisinopril    Hyperlipidemia: Chronic, stable. Takes rosuvastatin at home for secondary prevention. -continue statin    Depression/anxiety: Chronic, stable. He is on numerous medications for this including Wellbutrin, Xanax, hydroxyzine, Restoril.   -Hold home medications above  as could have contributed to altered mental status   Back pain/degenerative disc disease:  avoiding opioids at this time  - Tylenol and ibuprofen prn pain  -Voltaren gel and K pad   FEN/GI: HHD PPx:  Lovenox   Disposition: discharge to SNF pending acceptance, patient is medically stable for discharge   Subjective:  Patient reports that he has chronic pain that is stable.  Objective: Temp:  [98.2 F (36.8 C)-98.6 F (37 C)] 98.5 F (36.9 C) (01/20 0433) Pulse Rate:  [79-106] 80 (01/20 0433) Resp:  [16-18] 18 (01/20 0433) BP: (133-147)/(79-90) 147/80 (01/20 0433) SpO2:  [95 %-96 %] 95 % (01/20 0433)  Physical Exam: General: Male appearing stated age lying in bed in no acute distress Cardiovascular: Regular rate and rhythm without friction rubs or gallops or murmurs, bilateral radial pulses palpated Respiratory: Clear to auscultation bilaterally without wheezing, no increased work of breathing, no crackles appreciated, stable on room air Abdomen: Abdomen nontender to palpation, possible stool burden, bowel sounds present Extremities: No lower extremity edema, wound with evidence of sloughing, no extension of erythema, wound not draining neuro: Patient is alert and oriented to self and location and state as well as president, but not to city   Laboratory: Recent Labs  Lab 05/31/19 0705 06/01/19 0414 06/02/19 0748  WBC 13.3* 13.6* 10.9*  HGB 14.5 15.6 15.3  HCT 41.7 45.0 45.2  PLT 395 376 367   Recent Labs  Lab 05/27/19 0445 05/27/19 0445 05/28/19 0309 05/28/19 0309 05/29/19 1047 05/30/19 0417 05/31/19 0705 06/01/19 0414 06/02/19 0748  NA 137   < > 134*   < > 132*   < > 134* 135 135  K 3.9   < > 3.2*   < > 3.3*   < > 3.6 3.3* 3.5  CL 103   < > 100   < > 97*   < > 96* 96* 95*  CO2 27   < > 26   < > 23   < > 24 28 26   BUN 21   < > 16   < > 15   < > 14 11 11   CREATININE 0.79   < > 0.68   < > 0.48*   < > 0.69 0.64 0.68  CALCIUM 8.4*   < > 8.1*   < > 8.1*   < > 8.3* 8.6* 8.6*  PROT 5.8*  --  5.6*  --  5.6*  --   --   --   --   BILITOT 1.3*  --  1.1  --  1.0  --   --   --   --   ALKPHOS 61  --  55  --  47  --   --   --   --   ALT 153*  --  116*  --  74*  --   --   --    --   AST 78*  --  48*  --  27  --   --   --   --   GLUCOSE 109*   < > 111*   < > 93   < > 102* 109* 100*   < > = values in this interval not displayed.    Imaging/Diagnostic Tests: No results found.  Stark Klein, MD 06/02/2019, 9:52 AM PGY-3, [place] Intern pager: [phone], text pages welcome

## 2019-06-02 NOTE — Progress Notes (Signed)
[facility] Progress Note     Subjective: CC-  Patient much more alert and interactive this week. States that his left hip pain is less. Tolerating dressing changes well.  Objective: Vital signs in last 24 hours: Temp:  [98.2 F (36.8 C)-98.6 F (37 C)] 98.5 F (36.9 C) (01/20 0433) Pulse Rate:  [79-106] 80 (01/20 0433) Resp:  [16-18] 18 (01/20 0433) BP: (133-147)/(79-90) 147/80 (01/20 0433) SpO2:  [95 %-96 %] 95 % (01/20 0433) Last BM Date: 05/31/19(per pt)  Intake/Output from previous day: 01/19 0701 - 01/20 0700 In: 300 [P.O.:300] Out: 1050 [Urine:1050] Intake/Output this shift: No intake/output data recorded.  PE: Gen: Alert, NAD, comfortable Pulm: rate and effort normal on room air Skin: warm and dry Psych: Alert, oriented to self and year, thinks he is in [place] but does know he is in the hospital Left hip: area - firm eschar stable, no significant surrounding erythema or drainage, no fluctuance    Lab Results:  Recent Labs    06/01/19 0414 06/02/19 0748  WBC 13.6* 10.9*  HGB 15.6 15.3  HCT 45.0 45.2  PLT 376 367   BMET Recent Labs    05/31/19 0705 06/01/19 0414  NA 134* 135  K 3.6 3.3*  CL 96* 96*  CO2 24 28  GLUCOSE 102* 109*  BUN 14 11  CREATININE 0.69 0.64  CALCIUM 8.3* 8.6*   PT/INR No results for input(s): LABPROT, INR in the last 72 hours. CMP     Component Value Date/Time   NA 135 06/01/2019 0414   NA 144 06/16/2015 0837   K 3.3 (L) 06/01/2019 0414   CL 96 (L) 06/01/2019 0414   CO2 28 06/01/2019 0414   GLUCOSE 109 (H) 06/01/2019 0414   BUN 11 06/01/2019 0414   BUN 22 06/16/2015 0837   CREATININE 0.64 06/01/2019 0414   CREATININE 0.91 02/20/2018 1022   CALCIUM 8.6 (L) 06/01/2019 0414   PROT 5.6 (L) 05/29/2019 1047   PROT 6.0 06/16/2015 0837   ALBUMIN 2.3 (L) 05/29/2019 1047   ALBUMIN 4.2 06/16/2015 0837   AST 27 05/29/2019 1047   ALT 74 (H) 05/29/2019 1047   ALKPHOS 47 05/29/2019 1047   BILITOT 1.0  05/29/2019 1047   BILITOT 0.5 06/16/2015 0837   GFRNONAA >60 06/01/2019 0414   GFRNONAA 86 02/20/2018 1022   GFRAA >60 06/01/2019 0414   GFRAA 99 02/20/2018 1022   Lipase  No results found for: LIPASE     Studies/Results: No results found.  Anti-infectives: Anti-infectives (From admission, onward)   Start     Dose/Rate Route Frequency Ordered Stop   05/29/19 1330  ceFEPIme (MAXIPIME) 2 g in sodium chloride 0.9 % 100 mL IVPB  Status:  Discontinued     2 g 200 mL/hr over 30 Minutes Intravenous Every 8 hours 05/29/19 0916 05/29/19 1123   05/29/19 1100  metroNIDAZOLE (FLAGYL) IVPB 500 mg  Status:  Discontinued     500 mg 100 mL/hr over 60 Minutes Intravenous Every 8 hours 05/29/19 0916 05/29/19 1123   05/27/19 1845  vancomycin (VANCOCIN) IVPB 1000 mg/200 mL premix  Status:  Discontinued     1,000 mg 200 mL/hr over 60 Minutes Intravenous Every 12 hours 05/27/19 1219 05/29/19 1123   05/26/19 2200  ceFEPIme (MAXIPIME) 2 g in sodium chloride 0.9 % 100 mL IVPB  Status:  Discontinued     2 g 200 mL/hr over 30 Minutes Intravenous Every 8 hours 05/26/19 1852 05/29/19 0913   05/26/19 1900  metroNIDAZOLE (FLAGYL) IVPB 500 mg  Status:  Discontinued     500 mg 100 mL/hr over 60 Minutes Intravenous Every 8 hours 05/26/19 1849 05/29/19 0913   05/26/19 1845  vancomycin (VANCOREADY) IVPB 1250 mg/250 mL  Status:  Discontinued     1,250 mg 166.7 mL/hr over 90 Minutes Intravenous Every 12 hours 05/26/19 1841 05/27/19 1219       Assessment/Plan HTN HLD CAD s/p CABG 2005 H/o CVA BPH Depression/PTSD COVID-19+ AMS, Sepsis - improved Elevated CK/rhabdomyolysis  Necrotic left hip wound -Given recent COVID diagnosis, general anesthesia does carry significant pulmary risks including potential mortality. Currently this appears to be dry and with wet to dry dressing changes should slough - would recommend attempt at this first; if not cleaning up, would need pulmonary eval/clearance prior to any  attempted surgical debridement as he does not appear to be able to tolerate a bedside debridement - Will see M/W/F  ID -maxipime/vancomycin/flagyl 1/13-1/16 VTE -SCDs, lovenox FEN -HH Foley -none Follow up -TBD  Plan: Wound is stable/slowly improving. No signs of infection and no role for acute surgical intervention. Continue BID wet to dry dressing changes to soften eschar.    LOS: 7 days    [facility] 06/02/2019, 8:44 AM Please see Amion for pager number during day hours 7:00am-4:30pm

## 2019-06-02 NOTE — Progress Notes (Signed)
Patient's wife called staff and asked to talk with her husband's doctor. MD was notified. Will continue to monitor.

## 2019-06-02 NOTE — TOC Progression Note (Signed)
Transition of Care [facility]) - Progression Note    Patient Details  Name: Ivan [name] MRN: [id number] Date of Birth: Oct 11, 1945  Transition of Care [facility]) CM/SW [place], LCSW Phone Number: 06/02/2019, 10:16 AM  Clinical Narrative:    [facility] reviewing wounds to see if they have staffing for patient. CSW requested updated COVID test from MD, as SNF will require two negative test.    Expected Discharge Plan: [place] Barriers to Discharge: Continued Medical Work up  Expected Discharge Plan and Services Expected Discharge Plan: [place] arrangements for the past 2 months: Single Family Home                                       Social Determinants of Health (SDOH) Interventions    Readmission Risk Interventions No flowsheet data found.

## 2019-06-02 NOTE — Progress Notes (Addendum)
Writer offers to patient K pad to help with the back pain but patient refused. Will continue to monitor.

## 2019-06-03 DIAGNOSIS — R4182 Altered mental status, unspecified: Secondary | ICD-10-CM

## 2019-06-03 LAB — CBC WITH DIFFERENTIAL/PLATELET
Abs Immature Granulocytes: 0.06 10*3/uL (ref 0.00–0.07)
Basophils Absolute: 0 10*3/uL (ref 0.0–0.1)
Basophils Relative: 0 %
Eosinophils Absolute: 0.1 10*3/uL (ref 0.0–0.5)
Eosinophils Relative: 1 %
HCT: 46.3 % (ref 39.0–52.0)
Hemoglobin: 15.6 g/dL (ref 13.0–17.0)
Immature Granulocytes: 1 %
Lymphocytes Relative: 6 %
Lymphs Abs: 0.7 10*3/uL (ref 0.7–4.0)
MCH: 31.6 pg (ref 26.0–34.0)
MCHC: 33.7 g/dL (ref 30.0–36.0)
MCV: 93.9 fL (ref 80.0–100.0)
Monocytes Absolute: 0.9 10*3/uL (ref 0.1–1.0)
Monocytes Relative: 8 %
Neutro Abs: 10.7 10*3/uL — ABNORMAL HIGH (ref 1.7–7.7)
Neutrophils Relative %: 84 %
Platelets: 410 10*3/uL — ABNORMAL HIGH (ref 150–400)
RBC: 4.93 MIL/uL (ref 4.22–5.81)
RDW: 12.5 % (ref 11.5–15.5)
WBC: 12.5 10*3/uL — ABNORMAL HIGH (ref 4.0–10.5)
nRBC: 0 % (ref 0.0–0.2)

## 2019-06-03 LAB — BASIC METABOLIC PANEL
Anion gap: 14 (ref 5–15)
BUN: 14 mg/dL (ref 8–23)
CO2: 29 mmol/L (ref 22–32)
Calcium: 8.9 mg/dL (ref 8.9–10.3)
Chloride: 94 mmol/L — ABNORMAL LOW (ref 98–111)
Creatinine, Ser: 0.63 mg/dL (ref 0.61–1.24)
GFR calc Af Amer: >60 mL/min (ref >60)
GFR calc non Af Amer: >60 mL/min (ref >60)
Glucose, Bld: 100 mg/dL — ABNORMAL HIGH (ref 70–99)
Potassium: 3.8 mmol/L (ref 3.5–5.1)
Sodium: 137 mmol/L (ref 135–145)

## 2019-06-03 MED ORDER — AMLODIPINE BESYLATE 10 MG PO TABS
10.0000 mg | ORAL_TABLET | Freq: Every day | ORAL | Status: DC
  Administered 2019-06-04 – 2019-06-09 (×6): 10 mg via ORAL
  Filled 2019-06-03 (×6): qty 1

## 2019-06-03 MED ORDER — AMLODIPINE BESYLATE 5 MG PO TABS
5.0000 mg | ORAL_TABLET | Freq: Once | ORAL | Status: AC
  Administered 2019-06-03: 5 mg via ORAL
  Filled 2019-06-03: qty 1

## 2019-06-03 NOTE — Progress Notes (Signed)
Family Medicine Teaching Service Daily Progress Note Intern Pager: [phone]  Patient name: Ivan [name] Medical record number: [id number] Date of birth: 12-24-1945 Age: 74 y.o. Gender: male  Primary Care Provider: Sherald Hess., MD Consultants: Wound care, general surgery Code Status: Full code  Pt Overview and Major Events to Date:  1/13: Patient admitted for altered mental status and worsening pressure injury to left hip, started on vancomycin and cefepime, Lovenox at PE treatment level  Assessment and Plan: Ivan [name] is a 74 y.o. male admitted for AMS concerning for sepsis and COVID + with worsening pressure injury to left hip. PMH is significant for HTN, HLD, CAD, +COVID-19(neg x2 1/18 and 1/20).   AMS  Sepsis  Patient's mental states that he is disoriented this AM. Patient alert and oriented to self, state, but not to year, month or day of the week. Patient medically stable for discharge to SNF -PT/OT : recommending SNF -SW consulted for SNF placement, patient currently being reviewed by [facility]  - stable for discharge to SNF when available    Necrotic Left Hip Pressure Ulcer, improving General surgery consulted and softening eschar with no plan for acute debridement at this time. Wound sloughing, no signs of infection or worsening.  - Ibuoprofen and tylenol prn pain - General surgery recommendations appreciated: Wet-to-dry dressing and attempt to slough off eschar  Hypokalemia, resolved K 3.3>3.5>3.8, now WNL.  -Trend K with bmp  - will replenish as necessary   Elevated D-dimer (improved) D-dimer down trended consistently during this admission.   Likely due to COVID19 infection. LEx dopplers negative for DVT on admission.  D-dimer down to 2.78 as of 1/20.  -dc monitoring    COVID +, Resolved  Patient has denied any respiratory symptoms throughout this admission.2 neg repeat tests as of 1/18 and 1/20   -SORA   -monitor resp status with vitals   AKI  (resolved)  Acute urinary rention likely secondary to BPH, chronic and stable  Cr 0.64>0.68. Able to tolerate PO.  -continue flomax to 0.8 mg daily - continue  home finasteride - cont home lisinopril   Transaminitis (Improved) Last LFTs reassuring and down trended. This is likely 2/2 to COVID infection/sepsis.  -no further evaluation at this time    Elevated CK improved Normal UOP. Likely improving from recent hospitalization - monitor UOP   CAD, previous MI: Chronic, stable. Previous STEMI in 2016 and MI in 2012 per chart review.  His home medications include managed on lisinopril, nebivolol, aspirin, and Crestor at home. Unsure if patient has been taking beta blocker at home.  - continue home ASA and statin   Previous CVA: Chronic, stable. No known residual deficits.  Per chart review appears to be on Plavix, however was not on his outpatient medication list and his wife has been unclear about his medications.  Recent brain MRI on 1/5 without acute intracranial abnormalities. -continue  ASA and statin   Hypertension: Chronic, stable. BP still elevated 157/76. On home lisinopril at maximum dose.  Added Amlodipine 5, been on this new dose for four days. BP 153-161 overnight for systolic.  -continue amlodipine, increase to 10mg  - monitor BP -continue home lisinopril    Hyperlipidemia: Chronic, stable. Takes rosuvastatin at home for secondary prevention. -continue statin    Depression/anxiety: Chronic, stable. He is on numerous medications for this including Wellbutrin, Xanax, hydroxyzine, Restoril.   -Will dc Wellbutrin on dc, started Cymbalta on 1/20    Back pain/degenerative disc disease:  avoiding  opioids at this time  - Tylenol and ibuprofen prn pain  -Voltaren gel and K pad   FEN/GI: HHD PPx: Lovenox   Disposition:  patient is medically stable for discharge to SNF, [facility] is patient's choice   Subjective:  Patient's mental states that he is disoriented this  AM. Patient alert and oriented to self, state, but not to year, month or day of the week. Does not complain of SOB, chest pain with breathing, abdominal pain this morning.   Objective: Temp:  [98.1 F (36.7 C)-98.5 F (36.9 C)] 98.5 F (36.9 C) (01/21 0428) Pulse Rate:  [89-93] 89 (01/21 0428) Resp:  [18] 18 (01/21 0428) BP: (153-161)/(73-80) 157/76 (01/21 0843) SpO2:  [94 %-95 %] 95 % (01/21 0428)  Physical Exam: General: non-toxic male lying in bed in NAD Cardiovascular: RRR without murmurs or gallops or friction rubs  Respiratory: CTAB without wheezing or crackles or increased WOB, stable on RA  Abdomen: bowel sounds present, soft abdomen, non tender to palpation  Extremities: no LE edema, no new rashes noted  Neuro: alert, oriented to self and state but not to city or date.   Laboratory: Recent Labs  Lab 06/01/19 0414 06/02/19 0748 06/03/19 0655  WBC 13.6* 10.9* 12.5*  HGB 15.6 15.3 15.6  HCT 45.0 45.2 46.3  PLT 376 367 410*   Recent Labs  Lab 05/28/19 0309 05/28/19 0309 05/29/19 1047 05/30/19 0417 06/01/19 0414 06/02/19 0748 06/03/19 0655  NA 134*   < > 132*   < > 135 135 137  K 3.2*   < > 3.3*   < > 3.3* 3.5 3.8  CL 100   < > 97*   < > 96* 95* 94*  CO2 26   < > 23   < > 28 26 29   BUN 16   < > 15   < > 11 11 14   CREATININE 0.68   < > 0.48*   < > 0.64 0.68 0.63  CALCIUM 8.1*   < > 8.1*   < > 8.6* 8.6* 8.9  PROT 5.6*  --  5.6*  --   --   --   --   BILITOT 1.1  --  1.0  --   --   --   --   ALKPHOS 55  --  47  --   --   --   --   ALT 116*  --  74*  --   --   --   --   AST 48*  --  27  --   --   --   --   GLUCOSE 111*   < > 93   < > 109* 100* 100*   < > = values in this interval not displayed.    Imaging/Diagnostic Tests: No results found.  Stark Klein, MD 06/03/2019, 9:16 AM PGY-3, [place] Intern pager: [phone], text pages welcome

## 2019-06-03 NOTE — TOC Progression Note (Signed)
Transition of Care [facility]) - Progression Note    Patient Details  Name: Ivan [name] MRN: [id number] Date of Birth: 1945/07/10  Transition of Care [facility]) CM/SW [place], LCSW Phone Number: 06/03/2019, 3:03 PM  Clinical Narrative:    [facility] verifying insurance and should be able to accept patient tomorrow.    Expected Discharge Plan: [place] Barriers to Discharge: Continued Medical Work up  Expected Discharge Plan and Services Expected Discharge Plan: [place] arrangements for the past 2 months: Single Family Home                                       Social Determinants of Health (SDOH) Interventions    Readmission Risk Interventions No flowsheet data found.

## 2019-06-03 NOTE — Progress Notes (Signed)
Called to speak with patient's wife and provided updates on patient's status and care plan. [name] reported that she has spoken with [facility] and is awaiting further updates from SW.  Patient's wife shared readings about "Brain Fog" associated with patient recovering from COVID-19. She also expresses hope that his mental status will continue improve over the next few months.   Ivan Klein, MD  PGY-1, [place] Intern Pager [phone]

## 2019-06-04 DIAGNOSIS — G9341 Metabolic encephalopathy: Secondary | ICD-10-CM

## 2019-06-04 LAB — CBC WITH DIFFERENTIAL/PLATELET
Abs Immature Granulocytes: 0.04 10*3/uL (ref 0.00–0.07)
Basophils Absolute: 0 10*3/uL (ref 0.0–0.1)
Basophils Relative: 0 %
Eosinophils Absolute: 0.1 10*3/uL (ref 0.0–0.5)
Eosinophils Relative: 1 %
HCT: 44.3 % (ref 39.0–52.0)
Hemoglobin: 15 g/dL (ref 13.0–17.0)
Immature Granulocytes: 0 %
Lymphocytes Relative: 9 %
Lymphs Abs: 0.8 10*3/uL (ref 0.7–4.0)
MCH: 31.4 pg (ref 26.0–34.0)
MCHC: 33.9 g/dL (ref 30.0–36.0)
MCV: 92.9 fL (ref 80.0–100.0)
Monocytes Absolute: 0.8 10*3/uL (ref 0.1–1.0)
Monocytes Relative: 9 %
Neutro Abs: 7.2 10*3/uL (ref 1.7–7.7)
Neutrophils Relative %: 81 %
Platelets: 375 10*3/uL (ref 150–400)
RBC: 4.77 MIL/uL (ref 4.22–5.81)
RDW: 12.5 % (ref 11.5–15.5)
WBC: 9 10*3/uL (ref 4.0–10.5)
nRBC: 0 % (ref 0.0–0.2)

## 2019-06-04 LAB — BASIC METABOLIC PANEL
Anion gap: 10 (ref 5–15)
BUN: 15 mg/dL (ref 8–23)
CO2: 30 mmol/L (ref 22–32)
Calcium: 8.5 mg/dL — ABNORMAL LOW (ref 8.9–10.3)
Chloride: 93 mmol/L — ABNORMAL LOW (ref 98–111)
Creatinine, Ser: 0.58 mg/dL — ABNORMAL LOW (ref 0.61–1.24)
GFR calc Af Amer: >60 mL/min (ref >60)
GFR calc non Af Amer: >60 mL/min (ref >60)
Glucose, Bld: 114 mg/dL — ABNORMAL HIGH (ref 70–99)
Potassium: 3.6 mmol/L (ref 3.5–5.1)
Sodium: 133 mmol/L — ABNORMAL LOW (ref 135–145)

## 2019-06-04 MED ORDER — AMLODIPINE BESYLATE 10 MG PO TABS: 10.0000 mg | ORAL_TABLET | Freq: Every day | ORAL | 0 refills | Status: DC

## 2019-06-04 MED ORDER — TAMSULOSIN HCL 0.4 MG PO CAPS: 0.8000 mg | ORAL_CAPSULE | Freq: Every day | ORAL | 0 refills | Status: DC

## 2019-06-04 MED ORDER — DICLOFENAC SODIUM 1 % EX GEL: 2.0000 g | Freq: Four times a day (QID) | CUTANEOUS | 0 refills | Status: DC

## 2019-06-04 MED ORDER — DULOXETINE HCL 30 MG PO CPEP: 30.0000 mg | ORAL_CAPSULE | Freq: Every day | ORAL | 0 refills | Status: DC

## 2019-06-04 MED ORDER — LIDOCAINE 5 % EX PTCH: 1.0000 | MEDICATED_PATCH | CUTANEOUS | 0 refills | Status: DC

## 2019-06-04 NOTE — Discharge Instructions (Signed)
Delirium Delirium is a state of mental confusion. It comes on quickly and causes significant changes in a person's thinking and behavior. People with delirium usually have trouble paying attention to what is going on or knowing where they are. They may become very withdrawn or very emotional and unable to sit still. They may even see or feel things that are not there (hallucinations). Delirium is a sign of a serious underlying medical condition. What are the causes? Delirium occurs when something suddenly affects the signals that the brain sends out. Brain signals can be affected by anything that puts severe stress on the body and brain and causes brain chemicals to be out of balance. The most common causes of delirium include:  Infections. These may be bacterial, viral, fungal, or protozoal.  Medicines. These include many over-the-counter and prescription medicines.  Recreational drugs.  Substance withdrawal. This occurs with sudden discontinuation of alcohol, certain medicines, or recreational drugs.  Surgery and anesthesia.  Sudden vascular events, such as stroke and brain hemorrhage.  Other brain disorders, such as migraines, tumors, seizures, and physical head trauma.  Metabolic disorders, such as kidney or liver failure.  Low blood oxygen (anoxia). This may occur with lung disease, cardiac arrest, or carbon monoxide poisoning.  Hormone imbalances (endocrinopathies), such as an overactive thyroid (hyperthyroidism) or underactive thyroid (hypothyroidism).  Vitamin deficiencies. What increases the risk? The following factors may make someone more likely to develop this condition.  Being a child.  Being an older person.  Living alone.  Having vision loss or hearing loss.  Having an existing brain disease, such as dementia.  Having long-lasting (chronic) medical conditions, such as heart disease.  Being hospitalized for long periods of time. What are the signs or  symptoms? Delirium starts with a sudden change in a person's thinking or behavior. Symptoms include:  Not being able to stay awake (drowsiness) or pay attention.  Being confused about places, time, and people.  Forgetfulness.  Having extreme energy levels. These may be low or high.  Changes in sleep patterns.  Extreme mood swings, such as sudden anger or anxiety.  Focusing on things or ideas that are not important.  Rambling and senseless talking.  Difficulty speaking, understanding speech, or both.  Hallucinations.  Tremor or unsteady gait. Symptoms come and go (fluctuate) over time, and they are often worse at the end of the day. How is this diagnosed? People with delirium may not realize that they have the condition. Often, a family member or health care provider is the first person to notice the changes. This condition may be diagnosed based on a physical exam, health history, and tests.  The health care provider will obtain a detailed history. This may include questions about: ? Current symptoms. ? Medical issues. ? Medicines. ? Recreational drug use.  The health care provider will perform a mental status examination by: ? Asking questions to check for confusion. ? Watching for abnormal behavior.  The health care provider may also order lab tests or additional studies to determine the cause of the delirium. How is this treated? Treatment of delirium depends on the cause and severity. Delirium usually goes away within days or weeks of treating the underlying cause. In the meantime, do not leave the person alone because he or she may accidentally cause self-harm. This condition may be treated with supportive care, such as:  Increased light during the day and decreased light at night.  Low noise level.  Uninterrupted sleep.  A regular daily schedule.    Clocks and calendars to help with orientation.  Familiar objects, including the person's pictures and  clothing.  Frequent visits from familiar family and friends.  A healthy diet.  Gentle exercise. In more severe cases of delirium, medicine may be prescribed to help the person keep calm and think more clearly. Follow these instructions at home:  Continue supportive care as told by a health care provider.  Over-the-counter and prescription medicines should be taken only as told by a health care provider.  Ask a health care provider before using herbs or supplements.  Do not use alcohol or recreational drugs.  Keep all follow-up visits as told by a health care provider. This is important. Contact a health care provider if:  Symptoms do not get better or they become worse.  New symptoms of delirium develop.  Caring for the person at home does not seem safe.  Eating, drinking, or communicating stops.  There are side effects of medicines, such as changes in sleep patterns, dizziness, weight gain, restlessness, movement changes, or tremors. Get help right away if:  Serious thoughts occur about self-harm or about hurting others.  There are serious side effects of medicine, such as: ? Swelling of the face, lips, tongue, or throat. ? Fever, confusion, muscle spasms, or seizures. Summary  Delirium is a state of mental confusion. It comes on quickly and causes significant changes in a person's thinking and behavior.  Delirium is a sign of a serious underlying medical condition.  Certain medical conditions or a long hospital stay may increase the risk of developing delirium.  Treatment of delirium involves treating the underlying cause and providing supportive treatments, such as a calm and familiar environment. This information is not intended to replace advice given to you by your health care provider. Make sure you discuss any questions you have with your health care provider. Document Revised: 12/18/2017 Document Reviewed: 12/18/2017 Elsevier Patient Education  2020 Elsevier  Inc.  

## 2019-06-04 NOTE — Progress Notes (Signed)
Subjective: CC: Left hip pain Complains of pain over his left hip. He has been keeping his wtd dressing on.   Objective: Vital signs in last 24 hours: Temp:  [97.8 F (36.6 C)-98.5 F (36.9 C)] 97.9 F (36.6 C) (01/22 0525) Pulse Rate:  [83-99] 91 (01/22 0525) Resp:  [16] 16 (01/22 0525) BP: (113-157)/(53-80) 113/80 (01/22 0525) SpO2:  [96 %-100 %] 100 % (01/22 0525) Last BM Date: 06/01/19  Intake/Output from previous day: 01/21 0701 - 01/22 0700 In: -  Out: 250 [Urine:250] Intake/Output this shift: No intake/output data recorded.  PE: Gen: Alert, NAD, comfortable Pulm: rate and effort normal on room air Skin: warm and dry Left hip: area - firm eschar on edges, middle of eschar is more soft. No fluctuance. No significant surrounding erythema or drainage.      Lab Results:  Recent Labs    06/03/19 0655 06/04/19 0547  WBC 12.5* 9.0  HGB 15.6 15.0  HCT 46.3 44.3  PLT 410* 375   BMET Recent Labs    06/03/19 0655 06/04/19 0547  NA 137 133*  K 3.8 3.6  CL 94* 93*  CO2 29 30  GLUCOSE 100* 114*  BUN 14 15  CREATININE 0.63 0.58*  CALCIUM 8.9 8.5*   PT/INR No results for input(s): LABPROT, INR in the last 72 hours. CMP     Component Value Date/Time   NA 133 (L) 06/04/2019 0547   NA 144 06/16/2015 0837   K 3.6 06/04/2019 0547   CL 93 (L) 06/04/2019 0547   CO2 30 06/04/2019 0547   GLUCOSE 114 (H) 06/04/2019 0547   BUN 15 06/04/2019 0547   BUN 22 06/16/2015 0837   CREATININE 0.58 (L) 06/04/2019 0547   CREATININE 0.91 02/20/2018 1022   CALCIUM 8.5 (L) 06/04/2019 0547   PROT 5.6 (L) 05/29/2019 1047   PROT 6.0 06/16/2015 0837   ALBUMIN 2.3 (L) 05/29/2019 1047   ALBUMIN 4.2 06/16/2015 0837   AST 27 05/29/2019 1047   ALT 74 (H) 05/29/2019 1047   ALKPHOS 47 05/29/2019 1047   BILITOT 1.0 05/29/2019 1047   BILITOT 0.5 06/16/2015 0837   GFRNONAA >60 06/04/2019 0547   GFRNONAA 86 02/20/2018 1022   GFRAA >60 06/04/2019 0547   GFRAA 99 02/20/2018  1022   Lipase  No results found for: LIPASE     Studies/Results: No results found.  Anti-infectives: Anti-infectives (From admission, onward)   Start     Dose/Rate Route Frequency Ordered Stop   05/29/19 1330  ceFEPIme (MAXIPIME) 2 g in sodium chloride 0.9 % 100 mL IVPB  Status:  Discontinued     2 g 200 mL/hr over 30 Minutes Intravenous Every 8 hours 05/29/19 0916 05/29/19 1123   05/29/19 1100  metroNIDAZOLE (FLAGYL) IVPB 500 mg  Status:  Discontinued     500 mg 100 mL/hr over 60 Minutes Intravenous Every 8 hours 05/29/19 0916 05/29/19 1123   05/27/19 1845  vancomycin (VANCOCIN) IVPB 1000 mg/200 mL premix  Status:  Discontinued     1,000 mg 200 mL/hr over 60 Minutes Intravenous Every 12 hours 05/27/19 1219 05/29/19 1123   05/26/19 2200  ceFEPIme (MAXIPIME) 2 g in sodium chloride 0.9 % 100 mL IVPB  Status:  Discontinued     2 g 200 mL/hr over 30 Minutes Intravenous Every 8 hours 05/26/19 1852 05/29/19 0913   05/26/19 1900  metroNIDAZOLE (FLAGYL) IVPB 500 mg  Status:  Discontinued     500 mg 100 mL/hr  over 60 Minutes Intravenous Every 8 hours 05/26/19 1849 05/29/19 0913   05/26/19 1845  vancomycin (VANCOREADY) IVPB 1250 mg/250 mL  Status:  Discontinued     1,250 mg 166.7 mL/hr over 90 Minutes Intravenous Every 12 hours 05/26/19 1841 05/27/19 1219       Assessment/Plan HTN HLD CAD s/p CABG 2005 H/o CVA BPH Depression/PTSD COVID-19+ AMS, Sepsis - improved Elevated CK/rhabdomyolysis  Necrotic left hip wound -Given recent COVID diagnosis, general anesthesia does carry significant pulmary risks including potential mortality. Currently this appears to be dry and with wet to dry dressing changes should slough - would recommend attempt at this first; if not cleaning up, would need pulmonary eval/clearance prior to any attempted surgical debridement as he does not appear to be able to tolerate a bedside debridement.  - Will see M/W/F - Please call over the weekend with any  questions or concerns  ID -maxipime/vancomycin/flagyl 1/13-1/16 VTE -SCDs, lovenox FEN -HH Foley -none Follow up -TBD  Plan: Wound is slowly improving. No signs of infection and no role for acute surgical intervention. Central area of eschar is starting to soften. Continue wtd bid. May consider santyl chemical debridement in near future.    LOS: 9 days    Jillyn Ledger , [facility] 06/04/2019, 8:35 AM Please see Amion for pager number during day hours 7:00am-4:30pm

## 2019-06-04 NOTE — Progress Notes (Signed)
Updated wife Neoma Laming

## 2019-06-04 NOTE — Progress Notes (Signed)
Physical Therapy Treatment Patient Details Name: Ivan [name] MRN: [id number] DOB: 11-Dec-1945 Today's Date: 06/04/2019    History of Present Illness  74 y.o. male admitted on 05/26/2019 for worsening wound and AMS. Prior admission (COVID+) with UTI and fall with left hip contusion; pt leaving hospital AMA on 05/22/2019. PMH including HTN, HLD, depression, DDD, CABG x4, PTSD, and CAD.    PT Comments    Pt lying flat on back not in distress on entry, however when asked pt reports 10/10 pain in his back. Pt reports being unhappy with his pain medication management. Discussed need for use of non-pharmaceutical techniques for pain control including deep relaxing breaths. Instructed pt in breathing while providing total assist to come into sidelying and then bringing from sidelying to sitting. Pt participated in deep breathing with some success, allowing pt to sit EOB with moderate support for 5 minutes before hollering and requesting to lay back down. Applied K-pad to back at end of session for additional pain relief. D/c plan remains appropriate at this time. PT will continue to follow acutely.     Follow Up Recommendations  SNF     Equipment Recommendations  None recommended by PT       Precautions / Restrictions Precautions Precautions: Fall Precaution Comments: severe back pain Restrictions Weight Bearing Restrictions: No    Mobility  Bed Mobility Overal bed mobility: Needs Assistance Bed Mobility: Rolling;Sidelying to Sit;Sit to Sidelying Rolling: Total assist Sidelying to sit: Total assist     Sit to sidelying: Total assist General bed mobility comments: pt continues to require total A for bed mobility however has better command follow for maintaining spinal alignment to aid in pain managment with movement   Transfers                 General transfer comment: Defered for safety        Balance Overall balance assessment: Needs assistance Sitting-balance  support: Feet unsupported;No upper extremity supported Sitting balance-Leahy Scale: Poor Sitting balance - Comments: requires hands on minA to min guard with sitting balance                                    Cognition Arousal/Alertness: Awake/alert Behavior During Therapy: Agitated;Anxious;Restless Overall Cognitive Status: Impaired/Different from baseline Area of Impairment: Orientation;Memory;Safety/judgement;Problem solving;Attention;Following commands;Awareness                 Orientation Level: Disoriented to;Situation;Time;Place Current Attention Level: Divided Memory: Decreased short-term memory Following Commands: Follows one step commands inconsistently;Follows one step commands with increased time Safety/Judgement: Decreased awareness of safety;Decreased awareness of deficits   Problem Solving: Slow processing;Requires verbal cues;Requires tactile cues General Comments: pt continues to be confused regarding, circumstances of his hospitalization, focuses solely on his pain and perceived decrease managment of his pain          General Comments General comments (skin integrity, edema, etc.): VSS on RA, dressing on L hip clean, dry and intact       Pertinent Vitals/Pain Pain Assessment: 0-10 Pain Score: 10-Worst pain ever Pain Location: back Pain Descriptors / Indicators: Aching;Grimacing;Guarding;Discomfort;Constant Pain Intervention(s): Limited activity within patient's tolerance;Monitored during session;Repositioned;Heat applied;Utilized relaxation techniques    Home Living Family/patient expects to be discharged to:: Private residence Living Arrangements: Spouse/significant other Available Help at Discharge: Family;Available 24 hours/day Type of Home: House Home Access: Stairs to enter   Home Layout: Multi-level;Able to live on main level  with bedroom/bathroom Home Equipment: Shower seat Additional Comments: Information collected from prior  admission as pt with decreased orientation and cognition; poor historian.    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 Left last admission AMA   PT Goals (current goals can now be found in the care plan section) Acute Rehab PT Goals Patient Stated Goal: Stop back pain PT Goal Formulation: Patient unable to participate in goal setting Time For Goal Achievement: 06/10/19 Potential to Achieve Goals: Fair Progress towards PT goals: Progressing toward goals    Frequency    Min 2X/week      PT Plan Current plan remains appropriate    Co-evaluation              AM-PAC PT "6 Clicks" Mobility   Outcome Measure  Help needed turning from your back to your side while in a flat bed without using bedrails?: Total Help needed moving from lying on your back to sitting on the side of a flat bed without using bedrails?: Total Help needed moving to and from a bed to a chair (including a wheelchair)?: Total Help needed standing up from a chair using your arms (e.g., wheelchair or bedside chair)?: Total Help needed to walk in hospital room?: Total Help needed climbing 3-5 steps with a railing? : Total 6 Click Score: 6    End of Session   Activity Tolerance: Patient limited by pain Patient left: in bed;with call bell/phone within reach;with nursing/sitter in room Nurse Communication: Mobility status PT Visit Diagnosis: Other (comment);Muscle weakness (generalized) (M62.81);Difficulty in walking, not elsewhere classified (R26.2);Other abnormalities of gait and mobility (R26.89);Unsteadiness on feet (R26.81);History of falling (Z91.81)     Time: [id number] PT Time Calculation (min) (ACUTE ONLY): 24 min  Charges:  $Therapeutic Activity: 8-22 mins                     Dainelle Hun B. Migdalia Dk PT, DPT Acute Rehabilitation Services Pager [phone] Office [phone]    [place] 06/04/2019, 10:41 AM

## 2019-06-04 NOTE — Progress Notes (Signed)
Family Medicine Teaching Service Daily Progress Note Intern Pager: [phone]  Patient name: Ivan [name] Medical record number: [id number] Date of birth: 07/31/1945 Age: 74 y.o. Gender: male  Primary Care Provider: Sherald Hess., MD Consultants: Wound care, general surgery Code Status: Full code  Pt Overview and Major Events to Date:  1/13: Patient admitted for altered mental status and worsening pressure injury to left hip, started on vancomycin and cefepime, Lovenox at PE treatment level  Assessment and Plan: Ivan [name] is a 74 y.o. male admitted for AMS concerning for sepsis and COVID + with worsening pressure injury to left hip. PMH is significant for HTN, HLD, CAD, +COVID-19(neg x2 1/18 and 1/20).   AMS  Sepsis  Patient's mental status is improved today.  Patient is alert and oriented and conversant with thought content in the linear fashion.  Patient is oriented to self, place, city, state, situation and president.  Patient appears to be improved even from yesterday. Patient medically stable for discharge to SNF -PT/OT : recommending SNF - stable for discharge to SNF, plan for patient to discharge today   Necrotic Left Hip Pressure Ulcer, improving General surgery consulted and softening eschar with no plan for acute debridement at this time. Wound continues to slowly improve.  Wet-to-dry dressings recommended twice daily.  There is still no signs of infection in the wound.  Thanks seems to be well controlled. - Ibuoprofen and tylenol prn pain - General surgery recommendations appreciated: Wet-to-dry dressing and attempt to slough off eschar  Hypokalemia, resolved K 3.3>3.5>3.8 >3.6, now WNL.  -Trend K with bmp  - will replenish as necessary   Elevated D-dimer (improved) D-dimer down trended consistently during this admission. Likely due to COVID19 infection. -no further evaluation at this time    COVID +, Resolved  Patient has denied any respiratory symptoms  throughout this admission.2 neg repeat tests as of 1/18 and 1/20.  Patient continues to be without shortness of breath or chest pain or chest tightness.  Patient denies dyspnea.  Patient has been stable on room air entire admission.  -SORA   -monitor resp status with vitals   AKI (resolved)  Acute urinary rention likely secondary to BPH (resolved), chronic and stable  Cr 0.64>0.68>0.58. Able to tolerate PO.  -continue flomax to 0.8 mg daily - continue  home finasteride - cont home lisinopril   Transaminitis (Improved) Last LFTs reassuring and down trended. This is likely 2/2 to COVID infection/sepsis.  -no further evaluation at this time    Elevated CK improved Normal UOP. Likely improving from recent hospitalization - monitor UOP   CAD, previous MI: Chronic, stable. Previous STEMI in 2016 and MI in 2012 per chart review.  His home medications include managed on lisinopril, nebivolol, aspirin, and Crestor at home. Unsure if patient has been taking beta blocker at home.  - continue home ASA and statin   Previous CVA: Chronic, stable. No known residual deficits.  Per chart review appears to be on Plavix, however was not on his outpatient medication list and his wife has been unclear about his medications.  Recent brain MRI on 1/5 without acute intracranial abnormalities. -continue  ASA and statin   Hypertension: Chronic, stable. BP still elevated 157/76. On home lisinopril at maximum dose.  Added Amlodipine 5, been on this new dose for four days. BP 153-161 overnight for systolic.  -continue amlodipine 10mg  daily  - monitor BP -continue home lisinopril    Hyperlipidemia: Chronic, stable. Takes rosuvastatin at home  for secondary prevention. -continue statin    Depression/anxiety: Chronic, stable. He is on numerous medications for this including Wellbutrin, Xanax, hydroxyzine, Restoril.   -Will dc Wellbutrin on dc, started Cymbalta on 1/20    Back pain/degenerative disc  disease:  avoiding opioids at this time  - Tylenol and ibuprofen prn pain  -Voltaren gel and K pad   FEN/GI: HHD PPx: Lovenox   Disposition:  patient is medically stable for discharge to SNF,look for dc to [facility] today 1/22  Subjective:  Patient's mental states that he feels well from a respiratory standpoint but continues to have 8/10  lower extremity pain.   Objective: Temp:  [97.8 F (36.6 C)-98.5 F (36.9 C)] 97.9 F (36.6 C) (01/22 0525) Pulse Rate:  [83-99] 91 (01/22 0525) Resp:  [16] 16 (01/22 0525) BP: (113-142)/(53-80) 142/78 (01/22 0920) SpO2:  [96 %-100 %] 100 % (01/22 0525)  Physical Exam: General: Elderly male in no acute distress lying in bed Cardiovascular: Regular rate and rhythm with no murmurs or gallops or friction rub Respiratory: Clear to auscultation bilaterally without wheezing, no crackles, patient stable on room air with no increased work of breathing Abdomen: Abdomen is soft and nontender to palpation with bowel sounds appreciated Extremities: Patient continues to have no lower extremity edema Neuro: alert, oriented to self and state, city, state, Software engineer.  Patient requires reorientation to the day of the week.  Patient is aware of the month.  Laboratory: Recent Labs  Lab 06/02/19 0748 06/03/19 0655 06/04/19 0547  WBC 10.9* 12.5* 9.0  HGB 15.3 15.6 15.0  HCT 45.2 46.3 44.3  PLT 367 410* 375   Recent Labs  Lab 05/29/19 1047 05/30/19 0417 06/02/19 0748 06/03/19 0655 06/04/19 0547  NA 132*   < > 135 137 133*  K 3.3*   < > 3.5 3.8 3.6  CL 97*   < > 95* 94* 93*  CO2 23   < > 26 29 30   BUN 15   < > 11 14 15   CREATININE 0.48*   < > 0.68 0.63 0.58*  CALCIUM 8.1*   < > 8.6* 8.9 8.5*  PROT 5.6*  --   --   --   --   BILITOT 1.0  --   --   --   --   ALKPHOS 47  --   --   --   --   ALT 74*  --   --   --   --   AST 27  --   --   --   --   GLUCOSE 93   < > 100* 100* 114*   < > = values in this interval not displayed.     Imaging/Diagnostic Tests: No results found.  Stark Klein, MD 06/04/2019, 12:00 PM PGY-3, [place] Intern pager: [phone], text pages welcome

## 2019-06-05 DIAGNOSIS — F05 Delirium due to known physiological condition: Secondary | ICD-10-CM

## 2019-06-05 DIAGNOSIS — I252 Old myocardial infarction: Secondary | ICD-10-CM

## 2019-06-05 DIAGNOSIS — L89229 Pressure ulcer of left hip, unspecified stage: Secondary | ICD-10-CM

## 2019-06-05 DIAGNOSIS — E785 Hyperlipidemia, unspecified: Secondary | ICD-10-CM

## 2019-06-05 DIAGNOSIS — I10 Essential (primary) hypertension: Secondary | ICD-10-CM

## 2019-06-05 DIAGNOSIS — I639 Cerebral infarction, unspecified: Secondary | ICD-10-CM

## 2019-06-05 DIAGNOSIS — I2510 Atherosclerotic heart disease of native coronary artery without angina pectoris: Secondary | ICD-10-CM

## 2019-06-05 MED ORDER — TRAMADOL HCL 50 MG PO TABS: 50.0000 mg | ORAL_TABLET | Freq: Four times a day (QID) | ORAL | Status: DC | PRN

## 2019-06-05 MED ORDER — BACLOFEN 10 MG PO TABS
5.0000 mg | ORAL_TABLET | Freq: Three times a day (TID) | ORAL | Status: DC | PRN
  Administered 2019-06-05 (×2): 5 mg via ORAL
  Filled 2019-06-05 (×2): qty 1

## 2019-06-05 MED ORDER — TRAMADOL HCL 50 MG PO TABS
50.0000 mg | ORAL_TABLET | Freq: Four times a day (QID) | ORAL | Status: DC | PRN
  Administered 2019-06-05: 50 mg via ORAL
  Filled 2019-06-05: qty 1

## 2019-06-05 NOTE — Plan of Care (Signed)
Patient having only mild confusion, easily re-oriented.  Complaints of chronic back pain.  Will continue to monitor closely.

## 2019-06-05 NOTE — Progress Notes (Signed)
   Vital Signs MEWS/VS Documentation      06/04/2019 2026 06/04/2019 2154 06/05/2019 0000 06/05/2019 0050   MEWS Score:  0  0  0  0   MEWS Score Color:  Green  Green  Green  Green   BP:  120/71  --  --  --   Temp:  97.7 F (36.5 C)  --  --  --   O2 Device:  --  --  --  Room Air   Level of Consciousness:  --  Alert  Alert  Alert           Harl Bowie 06/05/2019,2:53 AM

## 2019-06-05 NOTE — Progress Notes (Addendum)
Family Medicine Teaching Service Daily Progress Note Intern Pager: [phone]  Patient name: Ivan [name] Medical record number: [id number] Date of birth: 10/10/1945 Age: 74 y.o. Gender: male  Primary Care Provider: Sherald Hess., MD Consultants: Wound care, general surgery Code Status: Full code  Pt Overview and Major Events to Date:  1/13: Patient admitted for altered mental status and worsening pressure injury to left hip, started on vancomycin and cefepime, Lovenox at PE treatment level  Assessment and Plan: Ivan [name] is a 74 y.o. male admitted for AMS concerning for sepsis and COVID + with necrotic hip wound. PMH is significant for HTN, HLD, CAD, +COVID-19 (neg x2 1/18 and 1/20).   AMS  Sepsis  Ivan [name] is alert and oriented to self, location, situation, city, state and president during our conversation this morning.  Is able to show evidence of some clinical reasoning this morning stating that he feels his orientation is improved and he has progress from when he was admitted.  Patient medically stable for discharge to SNF.  Patient was unable to discharge to [facility] so patient will remain hospitalized until able to transfer. -PT/OT : recommending SNF - stable for discharge to SNF, plan for patient to discharge possible 06/07/2019   Necrotic Left Hip Pressure Ulcer, improving General surgery consulted and softening eschar with no plan for acute debridement at this time. Wound continues to slowly improve.  Wet-to-dry dressings recommended twice daily.  There seems to be no signs of infection in the wound.   - Ibuoprofen and tylenol prn pain - General surgery recommendations appreciated: Wet-to-dry dressing and attempt to slough off eschar  Hypokalemia, resolved K 3.3>3.5>3.8 >3.6, now WNL.  -We will check prior to transition to SNF - will replenish as necessary   Elevated D-dimer (improved) D-dimer down trended consistently during this  admission. Likely due to COVID19 infection. -no further evaluation at this time    COVID +, Resolved  Patient has denied any respiratory symptoms throughout this admission. 2 neg repeat covid tests as of 1/18 and 1/20.  Patient continues to be without shortness of breath or chest pain or chest tightness.  Patient denies dyspnea.  Patient has been stable on room air entire admission.  -SORA   -monitor resp status with vitals   AKI (resolved)  Acute urinary rention likely secondary to BPH (resolved), chronic and stable  Cr 0.64>0.68>0.58. Able to tolerate PO.  -continue flomax to 0.8 mg daily - continue  home finasteride - cont home lisinopril   Transaminitis (Improved) Last LFTs reassuring and down trended. This is likely 2/2 to COVID infection/sepsis.  -no further evaluation at this time    Elevated CK improved Normal UOP. Likely improving from recent hospitalization - monitor UOP   CAD, previous MI: Chronic, stable. Previous STEMI in 2016 and MI in 2012 per chart review.  His home medications include managed on lisinopril, nebivolol, aspirin, and Crestor at home. Unsure if patient has been taking beta blocker at home.  - continue home ASA and statin   Previous CVA: Chronic, stable. No known residual deficits.  Per chart review appears to be on Plavix, however was not on his outpatient medication list and his wife has been unclear about his medications.  Recent brain MRI on 1/5 without acute intracranial abnormalities. -continue  ASA and statin   Hypertension: Chronic, stable and improved. BP improved at 126/81 this morning. On home lisinopril at maximum dose.   BP ranged from 108-142/69-81 overnight. -  continue amlodipine 10mg  and lisinopril  daily  - monitor BP   Hyperlipidemia: Chronic, stable. Takes rosuvastatin at home for secondary prevention. -continue statin    Depression/anxiety: Chronic, stable. He is reported to have been on numerous medications for this including  Wellbutrin, Xanax, hydroxyzine, Restoril.   -Recommend discontinuation of Wellbutrin on discharge and replacement with Cymbalta on 1/20, sinew Cymbalta   Back pain/degenerative disc disease: Throughout this admission we have been avoiding pain medications in efforts to not contribute to patient's altered mental status.  Patient is complaining of 8/10 back pain this morning and is very reasonable in his discussion of his pain and his medications.   -baclofen 5mg  given for complaint for muscle spasm/pain  - Tylenol and ibuprofen prn pain  -Voltaren gel and K pad -will consider adding tramadol if all else fails to relieve pain   FEN/GI: HHD PPx: Lovenox   Disposition:  patient continues to be medically stable for discharge to SNF hopefully on 06/07/2019  Subjective:  Ivan [name] appears alert and oriented and is conversant this morning.  Patient states that he has some back pain that is rated as a 8/10.  We discussed risk versus benefit of altering his mental status and controlling pain.  Patient inquires as to who will manage his pain when he is discharged to the SNF, to which I stated there will be a prescriber at this location.  Patient denies any shortness of breath, does report some abdominal soreness but no nausea.  Patient also adds that he thinks he may have some bruising on his left upper quadrant but I do not appreciate any ecchymosis on my exam.  Objective: Temp:  [97.7 F (36.5 C)-98 F (36.7 C)] 97.9 F (36.6 C) (01/23 0421) Pulse Rate:  [98] 98 (01/22 1309) Resp:  [16] 16 (01/22 1309) BP: (108-126)/(69-81) 126/81 (01/23 0421) SpO2:  [98 %] 98 % (01/22 1309)  Physical Exam: General: Male appearing stated age sitting up in bed alert Cardiovascular: Regular rate and rhythm with no murmurs, gallops, friction rubs, bilateral radial pulses palpable Respiratory: Clear to auscultation bilaterally without wheezing no increased work of breathing no crackles appreciated on pulmonary  exam Abdomen: Tenderness to palpation and left upper quadrant just inferior to rib cage, bowel sounds present, abdomen remains soft Extremities: No lower extremity edema Neuro: Patient is alert and oriented to person, place, city, state, situation and president this AM.  He is thinking in a linear fashion and is expressing ideas more clearly this morning.  Laboratory: Recent Labs  Lab 06/02/19 0748 06/03/19 0655 06/04/19 0547  WBC 10.9* 12.5* 9.0  HGB 15.3 15.6 15.0  HCT 45.2 46.3 44.3  PLT 367 410* 375   Recent Labs  Lab 05/29/19 1047 05/30/19 0417 06/02/19 0748 06/03/19 0655 06/04/19 0547  NA 132*   < > 135 137 133*  K 3.3*   < > 3.5 3.8 3.6  CL 97*   < > 95* 94* 93*  CO2 23   < > 26 29 30   BUN 15   < > 11 14 15   CREATININE 0.48*   < > 0.68 0.63 0.58*  CALCIUM 8.1*   < > 8.6* 8.9 8.5*  PROT 5.6*  --   --   --   --   BILITOT 1.0  --   --   --   --   ALKPHOS 47  --   --   --   --   ALT 74*  --   --   --   --  AST 27  --   --   --   --   GLUCOSE 93   < > 100* 100* 114*   < > = values in this interval not displayed.    Imaging/Diagnostic Tests: No results found.  Stark Klein, MD 06/05/2019, 9:34 AM PGY-3, [place] Intern pager: [phone], text pages welcome

## 2019-06-05 NOTE — Progress Notes (Signed)
Received call from charge RN that patient had expressed concerns that he had not seen a physician in the past 2-3 days despite me having rounded on the patient daily this entire past week.  Patient was requesting to speak with charge nurse and subsequently MD in order to discuss pain medications.  Patient's narcotic medications have been held prior to this point due to altered mental status however patient's mental status is improved and he is alert to person, location, city, state and president as well as month and day of the week during exam this morning.  I attempted to call the patient to his room a total of 5 times and he only received a busy signal.   -Added baclofen 5 mg 3 times daily -Placed order for tramadol per patient's home meds record  Stark Klein, MD  PGY-1, [place] Intern Pager [phone]

## 2019-06-05 NOTE — TOC Progression Note (Signed)
Transition of Care [facility]) - Progression Note    Patient Details  Name: Ivan [name] MRN: [id number] Date of Birth: 1945/07/31  Transition of Care [facility]) CM/SW [place], [place] Phone Number: 06/05/2019, 11:56 AM  Clinical Narrative:    CSW received consulted from physician Dr. Rosita Fire.  PASRR is under review for pt. TOC team will continue to follow for disposition planning.   Expected Discharge Plan: [place] Barriers to Discharge: Continued Medical Work up  Expected Discharge Plan and Services Expected Discharge Plan: [place] arrangements for the past 2 months: Single Family Home Expected Discharge Date: 06/04/19                                     Social Determinants of Health (SDOH) Interventions    Readmission Risk Interventions No flowsheet data found.

## 2019-06-05 NOTE — Plan of Care (Signed)

## 2019-06-06 LAB — CBC
HCT: 44.3 % (ref 39.0–52.0)
Hemoglobin: 15 g/dL (ref 13.0–17.0)
MCH: 31.6 pg (ref 26.0–34.0)
MCHC: 33.9 g/dL (ref 30.0–36.0)
MCV: 93.5 fL (ref 80.0–100.0)
Platelets: 358 10*3/uL (ref 150–400)
RBC: 4.74 MIL/uL (ref 4.22–5.81)
RDW: 12.5 % (ref 11.5–15.5)
WBC: 9.5 10*3/uL (ref 4.0–10.5)
nRBC: 0 % (ref 0.0–0.2)

## 2019-06-06 LAB — BASIC METABOLIC PANEL
Anion gap: 11 (ref 5–15)
BUN: 13 mg/dL (ref 8–23)
CO2: 27 mmol/L (ref 22–32)
Calcium: 8.9 mg/dL (ref 8.9–10.3)
Chloride: 99 mmol/L (ref 98–111)
Creatinine, Ser: 0.59 mg/dL — ABNORMAL LOW (ref 0.61–1.24)
GFR calc Af Amer: >60 mL/min (ref >60)
GFR calc non Af Amer: >60 mL/min (ref >60)
Glucose, Bld: 104 mg/dL — ABNORMAL HIGH (ref 70–99)
Potassium: 4.5 mmol/L (ref 3.5–5.1)
Sodium: 137 mmol/L (ref 135–145)

## 2019-06-06 MED ORDER — BACLOFEN 10 MG PO TABS
10.0000 mg | ORAL_TABLET | Freq: Three times a day (TID) | ORAL | Status: AC | PRN
  Administered 2019-06-06 (×2): 10 mg via ORAL
  Filled 2019-06-06 (×2): qty 1

## 2019-06-06 MED ORDER — NAPROXEN 250 MG PO TABS
250.0000 mg | ORAL_TABLET | Freq: Three times a day (TID) | ORAL | Status: DC | PRN
  Administered 2019-06-06 – 2019-06-09 (×5): 250 mg via ORAL
  Filled 2019-06-06 (×5): qty 1

## 2019-06-06 MED ORDER — TRAMADOL HCL 50 MG PO TABS
50.0000 mg | ORAL_TABLET | Freq: Four times a day (QID) | ORAL | Status: AC | PRN
  Administered 2019-06-06 – 2019-06-07 (×3): 50 mg via ORAL
  Filled 2019-06-06 (×3): qty 1

## 2019-06-06 NOTE — Progress Notes (Signed)
Family Medicine Teaching Service Daily Progress Note Intern Pager: [phone]  Patient name: Ivan [name] Medical record number: [id number] Date of birth: 07/14/1945 Age: 74 y.o. Gender: male  Primary Care Provider: Sherald Hess., MD Consultants: Wound care, general surgery Code Status: Full code  Pt Overview and Major Events to Date:  1/13: Patient admitted for altered mental status and worsening pressure injury to left hip, started on vancomycin and cefepime, Lovenox at PE treatment level  Assessment and Plan: Ivan [name] is a 74 y.o. male admitted for AMS concerning for sepsis and COVID + with necrotic hip wound. PMH is significant for HTN, HLD, CAD, +COVID-19 (neg x2 1/18 and 1/20).   AMS  Sepsis, stable A&O x4.  Does have moments where he is forgetful of his phrases, but overall conversant and pleasant. -PT/OT : recommending SNF - stable for discharge to SNF, plan for patient to discharge possible 06/07/2019   Necrotic Left Hip Pressure Ulcer, improving General surgery consulted and softening eschar with no plan for acute debridement at this time. Wound continues to slowly improve.  Wet-to-dry dressings recommended twice daily.  There seems to be no signs of infection in the wound.   - Naproxen and tylenol prn pain - General surgery recommendations appreciated: Wet-to-dry dressing and attempt to slough off eschar  COVID +, Resolved  Patient has denied any respiratory symptoms throughout this admission. 2 neg repeat covid tests as of 1/18 and 1/20.  Patient continues to be without shortness of breath or chest pain or chest tightness.  Patient denies dyspnea.  Patient has been stable on room air entire admission.  -SORA   -monitor resp status with vitals   CAD, previous MI: Chronic, stable. Previous STEMI in 2016 and MI in 2012 per chart review.  His home medications include managed on lisinopril, nebivolol, aspirin, and Crestor at home. Unsure if patient has been  taking beta blocker at home.  - continue home ASA and statin -We will trial naproxen over ibuprofen as this has a better cardiac profile   Previous CVA: Chronic, stable. No known residual deficits.  Per chart review appears to be on Plavix, however was not on his outpatient medication list and his wife has been unclear about his medications.  Recent brain MRI on 1/5 without acute intracranial abnormalities. -continue  ASA and statin   Hypertension: Chronic, stable and improved. BP normotensive.  -continue amlodipine 10mg  and lisinopril  daily  - monitor BP   Hyperlipidemia: Chronic, stable. Takes rosuvastatin at home for secondary prevention. -continue statin    Depression/anxiety: Chronic, stable. He is reported to have been on numerous medications for this including Wellbutrin, Xanax, hydroxyzine, Restoril.   -Recommend discontinuation of Wellbutrin on discharge and replacement with Cymbalta on 1/20,    Back pain/degenerative disc disease: Continues to complain of back pain.  Says he did have improvement with baclofen and tramadol.  Transitioning NSAID to naproxen as favors better cardiac profile. -Increase baclofen 10mg  3 times daily - Tylenol and Naproxen prn pain  -Tramadol for breakthrough -Voltaren gel and K pad   FEN/GI: HHD PPx: Lovenox   Disposition:  patient continues to be medically stable for discharge to SNF hopefully on 06/07/2019  Subjective:  Alert and oriented.  Conversant and pleasant, but does have times where he forgets his words.  Still complaining of back pain although says that overall it is better.  Objective: Temp:  [97.7 F (36.5 C)-98.3 F (36.8 C)] 98.3 F (36.8 C) (01/24 0556) Pulse  Rate:  [87-94] 94 (01/24 0556) Resp:  [16-18] 18 (01/24 0556) BP: (116-128)/(66-76) 116/76 (01/24 0556) SpO2:  [96 %-97 %] 96 % (01/24 0556)  Physical Exam: General: Male appearing stated age sitting up in bed alert Cardiovascular: Regular rate and rhythm with  no murmurs, gallops, friction rubs, bilateral radial pulses palpable Respiratory: Clear to auscultation bilaterally without wheezing no increased work of breathing no crackles appreciated on pulmonary exam Abdomen: Soft, nontender, nondistended Extremities: No lower extremity edema Neuro: Alert and oriented x4.  Moves all limbs spontaneously Laboratory: Recent Labs  Lab 06/03/19 0655 06/04/19 0547 06/06/19 0320  WBC 12.5* 9.0 9.5  HGB 15.6 15.0 15.0  HCT 46.3 44.3 44.3  PLT 410* 375 358   Recent Labs  Lab 06/03/19 0655 06/04/19 0547 06/06/19 0320  NA 137 133* 137  K 3.8 3.6 4.5  CL 94* 93* 99  CO2 29 30 27   BUN 14 15 13   CREATININE 0.63 0.58* 0.59*  CALCIUM 8.9 8.5* 8.9  GLUCOSE 100* 114* 104*    Imaging/Diagnostic Tests: No results found.  Bonnita Hollow, MD 06/06/2019, 9:50 AM PGY-3, [place] Intern pager: [phone], text pages welcome

## 2019-06-07 NOTE — Progress Notes (Signed)
Family Medicine Teaching Service Daily Progress Note Intern Pager: [phone]  Patient name: Ivan [name] Medical record number: [id number] Date of birth: 31-Jan-1946 Age: 74 y.o. Gender: male  Primary Care Provider: Sherald Hess., MD Consultants: Wound care, general surgery Code Status: Full code  Pt Overview and Major Events to Date:  1/13: Patient admitted for altered mental status and worsening pressure injury to left hip, started on vancomycin and cefepime, Lovenox at PE treatment level  Assessment and Plan: Ivan [name] is a 74 y.o. male admitted for AMS concerning for sepsis and COVID + with necrotic hip wound. PMH is significant for HTN, HLD, CAD, +COVID-19 (neg x2 1/18 and 1/20).   AMS  Sepsis, stable Alert and oriented this morning. Conversational and questioning his outpatient pain regimen. Medically stable for discharge.  -PT/OT : recommending SNF -Follow up with SW regarding PASSAR    Necrotic Left Hip Pressure Ulcer, improving Patient denies left hip pain.  No signs of purulent drainage. Non-tender to touch. Per general surgery continue wet to dry dressing (BID to soften eschar. Continues to improve.  - Follow up outpatient with wound clinic  - Naproxen and tylenol prn pain - General surgery recommendations appreciated: Wet-to-dry dressing and attempt to slough off eschar  COVID +, Resolved  Continues to deny respiratory symptoms. Two negative repeat covid tests as of 1/18 and 1/20.  Patient has been stable on room air entire admission. -monitor resp status with vitals   CAD, previous MI: Chronic, stable. Previous STEMI in 2016 and MI in 2012 per chart review.  His home medications include managed on lisinopril, nebivolol, aspirin, and Crestor at home. Unsure if patient has been taking beta blocker at home.  - continue home ASA and statin -We will trial naproxen over ibuprofen as this has a better cardiac profile   Previous CVA: Chronic, stable. No known  residual deficits.  Per chart review appears to be on Plavix, however was not on his outpatient medication list and his wife has been unclear about his medications.  Recent brain MRI on 1/5 without acute intracranial abnormalities. -continue  ASA and statin   Hypertension: Chronic, stable and improved. BP 119/79 normotensive  -continue amlodipine 10mg  and lisinopril daily - monitor BP   Hyperlipidemia: Chronic, stable. Takes rosuvastatin at home for secondary prevention. -continue statin    Depression/anxiety: Chronic, stable. He is reported to have been on numerous medications for this including Wellbutrin, Xanax, hydroxyzine, Restoril.   -Recommend discontinuation of Wellbutrin on discharge and replacement with Cymbalta on 1/20,    Back pain/degenerative disc disease: Continues to complain of back pain.  Says he did have improvement with baclofen and tramadol.  Transitioning NSAID to naproxen as favors better cardiac profile. -Increase baclofen 10mg  3 times daily - Tylenol and Naproxen prn pain  -Tramadol for breakthrough -Voltaren gel and K pad   FEN/GI: heart healthy diet  PPx: Lovenox   Disposition: Medically stable for discharge   Subjective:  Ivan [name] had no significant overnight events.   Objective: Temp:  [97.8 F (36.6 C)-98 F (36.7 C)] 97.8 F (36.6 C) (01/25 0524) Pulse Rate:  [89-92] 92 (01/25 0524) Resp:  [18] 18 (01/25 0524) BP: (119-122)/(72-76) 119/76 (01/25 0524) SpO2:  [97 %-98 %] 97 % (01/25 0524)  Physical Exam: GEN: elderly male in no acute distress talking on phone  CV: regular rate and rhythm, no murmurs appreciated  RESP: no increased work of breathing, clear to ascultation bilaterally with no crackles, wheezes,  or rhonchi  ABD: Bowel sounds present. Soft, Nontender, Nondistended.  MSK: no lower extremity edema  SKIN: left hip eschar, without significant erythema or tenderness  NEURO: alert and oriented, normal speech       Laboratory: Recent Labs  Lab 06/03/19 0655 06/04/19 0547 06/06/19 0320  WBC 12.5* 9.0 9.5  HGB 15.6 15.0 15.0  HCT 46.3 44.3 44.3  PLT 410* 375 358   Recent Labs  Lab 06/03/19 0655 06/04/19 0547 06/06/19 0320  NA 137 133* 137  K 3.8 3.6 4.5  CL 94* 93* 99  CO2 29 30 27   BUN 14 15 13   CREATININE 0.63 0.58* 0.59*  CALCIUM 8.9 8.5* 8.9  GLUCOSE 100* 114* 104*    Imaging/Diagnostic Tests: No results found.  Lyndee Hensen, DO PGY-1, [facility] 06/07/2019 8:19 AM    [facility] Intern pager: [phone], text pages welcome

## 2019-06-07 NOTE — Progress Notes (Signed)
Subjective: CC: Wound  Patient supposed to go to SNF today. No change in wound. Pain has improved. No drainage.   Objective: Vital signs in last 24 hours: Temp:  [97.8 F (36.6 C)-98 F (36.7 C)] 97.8 F (36.6 C) (01/25 0524) Pulse Rate:  [89-92] 92 (01/25 0524) Resp:  [18] 18 (01/25 0524) BP: (119-122)/(72-76) 119/76 (01/25 0524) SpO2:  [97 %-98 %] 97 % (01/25 0524) Last BM Date: 06/04/19  Intake/Output from previous day: 01/24 0701 - 01/25 0700 In: 240 [P.O.:240] Out: 700 [Urine:700] Intake/Output this shift: No intake/output data recorded.  PE: [id number], comfortable Pulm:rate and effort normal on room air Skin: warm and dry Left [id number] - firmescharon edges, middle of eschar is more soft. No fluctuance. No significant surrounding erythema or drainage. Improving     Lab Results:  Recent Labs    06/06/19 0320  WBC 9.5  HGB 15.0  HCT 44.3  PLT 358   BMET Recent Labs    06/06/19 0320  NA 137  K 4.5  CL 99  CO2 27  GLUCOSE 104*  BUN 13  CREATININE 0.59*  CALCIUM 8.9   PT/INR No results for input(s): LABPROT, INR in the last 72 hours. CMP     Component Value Date/Time   NA 137 06/06/2019 0320   NA 144 06/16/2015 0837   K 4.5 06/06/2019 0320   CL 99 06/06/2019 0320   CO2 27 06/06/2019 0320   GLUCOSE 104 (H) 06/06/2019 0320   BUN 13 06/06/2019 0320   BUN 22 06/16/2015 0837   CREATININE 0.59 (L) 06/06/2019 0320   CREATININE 0.91 02/20/2018 1022   CALCIUM 8.9 06/06/2019 0320   PROT 5.6 (L) 05/29/2019 1047   PROT 6.0 06/16/2015 0837   ALBUMIN 2.3 (L) 05/29/2019 1047   ALBUMIN 4.2 06/16/2015 0837   AST 27 05/29/2019 1047   ALT 74 (H) 05/29/2019 1047   ALKPHOS 47 05/29/2019 1047   BILITOT 1.0 05/29/2019 1047   BILITOT 0.5 06/16/2015 0837   GFRNONAA >60 06/06/2019 0320   GFRNONAA 86 02/20/2018 1022   GFRAA >60 06/06/2019 0320   GFRAA 99 02/20/2018 1022   Lipase  No results found for: LIPASE     Studies/Results: No  results found.  Anti-infectives: Anti-infectives (From admission, onward)   Start     Dose/Rate Route Frequency Ordered Stop   05/29/19 1330  ceFEPIme (MAXIPIME) 2 g in sodium chloride 0.9 % 100 mL IVPB  Status:  Discontinued     2 g 200 mL/hr over 30 Minutes Intravenous Every 8 hours 05/29/19 0916 05/29/19 1123   05/29/19 1100  metroNIDAZOLE (FLAGYL) IVPB 500 mg  Status:  Discontinued     500 mg 100 mL/hr over 60 Minutes Intravenous Every 8 hours 05/29/19 0916 05/29/19 1123   05/27/19 1845  vancomycin (VANCOCIN) IVPB 1000 mg/200 mL premix  Status:  Discontinued     1,000 mg 200 mL/hr over 60 Minutes Intravenous Every 12 hours 05/27/19 1219 05/29/19 1123   05/26/19 2200  ceFEPIme (MAXIPIME) 2 g in sodium chloride 0.9 % 100 mL IVPB  Status:  Discontinued     2 g 200 mL/hr over 30 Minutes Intravenous Every 8 hours 05/26/19 1852 05/29/19 0913   05/26/19 1900  metroNIDAZOLE (FLAGYL) IVPB 500 mg  Status:  Discontinued     500 mg 100 mL/hr over 60 Minutes Intravenous Every 8 hours 05/26/19 1849 05/29/19 0913   05/26/19 1845  vancomycin (VANCOREADY) IVPB 1250 mg/250 mL  Status:  Discontinued     1,250 mg 166.7 mL/hr over 90 Minutes Intravenous Every 12 hours 05/26/19 1841 05/27/19 1219       Assessment/Plan HTN HLD CAD s/p CABG 2005 H/o CVA BPH Depression/PTSD COVID-19+ - negative test 1/20  AMS, Sepsis- improved  Necrotic left hip wound -Given recent COVID diagnosis, general anesthesia does carry significant pulmary risks including potential mortality. Currently this appears to be dry and with wet to dry dressing changes should slough - would recommend attempt at this first - It appears that the patient is going to be discharged to SNF per notes. No indication for emergent surgery. We will follow peripherally. Recommend f/u in wound care clinic   ID -maxipime/vancomycin/flagyl 1/13-1/16 . WBC 9.5 on 1/24 VTE -SCDs, lovenox FEN -HH Foley -none Follow up -TBD  Plan:  Wound continues to slowly improve. No signs of infection and no role for acute surgical intervention. Central area of eschar is starting to soften. Continue wtd bid. Follow up in wound care clinic. He is anticipated to be discharged to SNF today per notes.    LOS: 12 days    Jillyn Ledger , [facility] 06/07/2019, 8:55 AM Please see Amion for pager number during day hours 7:00am-4:30pm

## 2019-06-07 NOTE — TOC Progression Note (Signed)
Transition of Care [facility]) - Progression Note    Patient Details  Name: Ivan [name] MRN: [id number] Date of Birth: 01-14-1946  Transition of Care [facility]) CM/SW [place], LCSW Phone Number: 06/07/2019, 11:11 AM  Clinical Narrative:    Pasrr now under level II review. Pasrr screener will have to interview patient.    Expected Discharge Plan: [place] Barriers to Discharge: Continued Medical Work up  Expected Discharge Plan and Services Expected Discharge Plan: [place] arrangements for the past 2 months: Single Family Home Expected Discharge Date: 06/04/19                                     Social Determinants of Health (SDOH) Interventions    Readmission Risk Interventions No flowsheet data found.

## 2019-06-08 MED ORDER — TRAMADOL HCL 50 MG PO TABS
50.0000 mg | ORAL_TABLET | Freq: Four times a day (QID) | ORAL | Status: DC | PRN
  Administered 2019-06-08 – 2019-06-09 (×2): 50 mg via ORAL
  Filled 2019-06-08 (×2): qty 1

## 2019-06-08 NOTE — Progress Notes (Addendum)
Occupational Therapy Treatment Patient Details Name: Ivan [name] MRN: [id number] DOB: [birth year] Today's Date: 06/08/2019    History of present illness  74 y.o. male admitted on 05/26/2019 for worsening wound and AMS. Prior admission (COVID+) with UTI and fall with left hip contusion; pt leaving hospital AMA on 05/22/2019. PMH including HTN, HLD, depression, DDD, CABG x4, PTSD, and CAD.   OT comments  Pt making gradual progress towards OT goals, initially requiring encouragement to participate in therapies as pt focusing on pain and current pain regimen upon entry to room. Pt premedicated just prior to session start. He continues to require heavy assist for bed mobility given pain levels, however tolerated bed mobility and able to stand at [facility] with two person assist. Pt standing while RN changing bed linens and also able to take few steps along EOB with +2 assist prior to return to sitting. Pt continues to report high levels of pain with all activity and with limited activity tolerance, requiring cues for deep breathing/relaxation techniques as additional method for reducing pain levels. Feel pt remains appropriate for SNF level therapies at time of discharge to maximize his strength and independence with ADL and mobility. Will follow.    Follow Up Recommendations  SNF;Supervision/Assistance - 24 hour    Equipment Recommendations  None recommended by OT          Precautions / Restrictions Precautions Precautions: Fall Precaution Comments: severe back pain Restrictions Weight Bearing Restrictions: No       Mobility Bed Mobility Overal bed mobility: Needs Assistance Bed Mobility: Rolling;Sidelying to Sit;Sit to Sidelying Rolling: Max assist;+2 for physical assistance Sidelying to sit: Total assist;+2 for physical assistance;+2 for safety/equipment     Sit to sidelying: Total assist;+2 for physical assistance;+2 for safety/equipment General bed mobility comments: two person assist  for completing bed mobility, pt taking rest break in sidelying prior to transition to sitting EOB, assist for LEs and trunk elevation  Transfers Overall transfer level: Needs assistance Equipment used: Rolling walker (2 wheeled) Transfers: Sit to/from Stand Sit to Stand: Mod assist;+2 physical assistance;+2 safety/equipment         General transfer comment: boosting and steadying assist at RW, notable weakness in LEs, but pt able to take few side steps along EOB with +2 minA prior to return to sitting, tolerated standing while RN changes bed linens     Balance Overall balance assessment: Needs assistance Sitting-balance support: No upper extremity supported;Bilateral upper extremity supported;Feet supported Sitting balance-Leahy Scale: Poor Sitting balance - Comments: close minguard - minA for sitting balance, often reliant on at least single UE support                                   ADL either performed or assessed with clinical judgement   ADL Overall ADL's : Needs assistance/impaired                 Upper Body Dressing : Minimal assistance;Moderate assistance;Sitting Upper Body Dressing Details (indicate cue type and reason): donning new gown while seated EOB                 Functional mobility during ADLs: Minimal assistance;Moderate assistance;+2 for physical assistance;+2 for safety/equipment;Rolling walker General ADL Comments: pt continues to have limitations due to pain, weakness and decreased cognition  Cognition Arousal/Alertness: Awake/alert Behavior During Therapy: Agitated;Anxious;Restless Overall Cognitive Status: Impaired/Different from baseline Area of Impairment: Memory;Safety/judgement;Problem solving;Attention;Following commands;Awareness                   Current Attention Level: Selective Memory: Decreased short-term memory Following Commands: Follows one step commands  inconsistently;Follows one step commands with increased time Safety/Judgement: Decreased awareness of safety;Decreased awareness of deficits Awareness: Emergent Problem Solving: Slow processing;Requires verbal cues;Requires tactile cues General Comments: pt continues to remain quite focused on pain management and regimen, benefits from redirection for functional tasks         Exercises     Shoulder Instructions       General Comments      Pertinent Vitals/ Pain       Pain Assessment: Faces Faces Pain Scale: Hurts whole lot Pain Location: back Pain Descriptors / Indicators: Aching;Grimacing;Guarding;Discomfort;Constant Pain Intervention(s): Limited activity within patient's tolerance;Monitored during session;Repositioned;Premedicated before session  Home Living                                          Prior Functioning/Environment              Frequency  Min 2X/week        Progress Toward Goals  OT Goals(current goals can now be found in the care plan section)  Progress towards OT goals: Progressing toward goals  Acute Rehab OT Goals Patient Stated Goal: Stop back pain OT Goal Formulation: With patient Time For Goal Achievement: 06/22/19 Potential to Achieve Goals: Good  Plan Discharge plan remains appropriate    Co-evaluation    PT/OT/SLP Co-Evaluation/Treatment: Yes Reason for Co-Treatment: Complexity of the patient's impairments (multi-system involvement);For patient/therapist safety;To address functional/ADL transfers   OT goals addressed during session: ADL's and self-care      AM-PAC OT "6 Clicks" Daily Activity     Outcome Measure   Help from another person eating meals?: A Little Help from another person taking care of personal grooming?: A Lot Help from another person toileting, which includes using toliet, bedpan, or urinal?: Total Help from another person bathing (including washing, rinsing, drying)?: A Lot Help from  another person to put on and taking off regular upper body clothing?: A Lot Help from another person to put on and taking off regular lower body clothing?: Total 6 Click Score: 11    End of Session Equipment Utilized During Treatment: Rolling walker  OT Visit Diagnosis: Unsteadiness on feet (R26.81);Other abnormalities of gait and mobility (R26.89);Muscle weakness (generalized) (M62.81);Other symptoms and signs involving cognitive function;Pain Pain - part of body: (back)   Activity Tolerance Patient tolerated treatment well;Patient limited by pain   Patient Left in bed;with call bell/phone within reach;with bed alarm set   Nurse Communication Mobility status        Time: [id number] OT Time Calculation (min): 24 min  Charges: OT General Charges $OT Visit: 1 Visit OT Treatments $Therapeutic Activity: 8-22 mins  Lou Cal, OT Supplemental Rehabilitation Services Pager [phone] Office [place] 06/08/2019, 1:02 PM

## 2019-06-08 NOTE — Progress Notes (Signed)
Called patient's wife and the phone rang until it went to voicemail.  Left message for wife to call the  floor if she would like the an update.   Lyndee Hensen, DO PGY-1, [facility] 06/08/2019 2:04 PM

## 2019-06-08 NOTE — Progress Notes (Signed)
Spoke with Ivan [name] in infection prevention. Patient is cleared to move to another floor and d/c contact and airborne precautions.

## 2019-06-08 NOTE — Progress Notes (Signed)
Family Medicine Teaching Service Daily Progress Note Intern Pager: [phone]  Patient name: Ivan [name] Medical record number: [id number] Date of birth: 04/26/1946 Age: 74 y.o. Gender: male  Primary Care Provider: Sherald Hess., MD Consultants: Wound care, general surgery Code Status: Full code  Pt Overview and Major Events to Date:  1/13: Patient admitted for altered mental status and worsening pressure injury to left hip, started on vancomycin and cefepime, Lovenox at PE treatment level  Assessment and Plan: Ivan [name] is a 74 y.o. male admitted for AMS concerning for sepsis and COVID + with necrotic hip wound. PMH is significant for HTN, HLD, CAD, +COVID-19 (neg x2 1/18 and 1/20).   AMS  Sepsis, stable Alert and oriented this morning. PASSAR Level II review.  Patient must be interviewed for acceptance.  -Medically stable for discharge -PT/OT recommend SNF -Follow-up with social work regarding PASSAR     Necrotic Left Hip Pressure Ulcer, improving Per general surgery continue wet to dry dressing (BID) to soften eschar. Continues to improve.  - Follow up outpatient with wound clinic  - Naproxen and tylenol prn pain - General surgery recommendations appreciated: Wet-to-dry dressing and attempt to slough off eschar  COVID +, Resolved  Denies respiratory symptoms.  Two negative repeat covid tests as of 1/18 and 1/20.  Continues to be stable on room air throughout admission. -monitor resp status with vitals   CAD, previous MI: Chronic, stable. Previous STEMI in 2016 and MI in 2012 per chart review.  His home medications include managed on lisinopril, nebivolol, aspirin, and Crestor at home. Unsure if patient has been taking beta blocker at home.  - continue home ASA and statin - naproxen over ibuprofen as this has a better cardiac profile   Previous CVA: Chronic, stable. No known residual deficits.  Per chart review appears to be on Plavix, however was not on his  outpatient medication list and his wife has been unclear about his medications.  Recent brain MRI on 1/5 without acute intracranial abnormalities. -continue  ASA and statin   Hypertension: Chronic, stable and improved. BP 123/64. -continue amlodipine 10mg  and lisinopril daily - monitor BP   Hyperlipidemia: Chronic, stable. Takes rosuvastatin at home for secondary prevention. -continue statin    Depression/anxiety: Chronic, stable. He is reported to have been on numerous medications for this including Wellbutrin, Xanax, hydroxyzine, Restoril.   -Recommend discontinuation of Wellbutrin on discharge and replacement with Cymbalta on 1/20,    Back pain/degenerative disc disease: Continues to complain of back pain.  Says he did have improvement with baclofen and tramadol.  In addition to  naproxen as favors better cardiac profile. -Increase baclofen 10mg  3 times daily - Tylenol and Naproxen prn pain  -Tramadol for breakthrough -Voltaren gel and K pad   FEN/GI: heart healthy diet  PPx: Lovenox   Disposition: Medically stable for discharge  Subjective:  Ivan [name] no significant overnight events. Complains of muscle spasms after PT today   Objective: Temp:  [97.7 F (36.5 C)-98.2 F (36.8 C)] 98.2 F (36.8 C) (01/26 0452) Pulse Rate:  [83-99] 83 (01/26 0452) Resp:  [18-19] 18 (01/26 0452) BP: (101-127)/(64-77) 123/64 (01/26 0452) SpO2:  [94 %-95 %] 95 % (01/26 0452) Weight:  [85.9 kg] 85.9 kg (01/26 0327)  Physical Exam: GEN: elderly male in no acute distress  CV: regular rate and rhythm, no murmurs appreciated  RESP: no increased work of breathing, clear to ascultation bilaterally with no crackles, wheezes, or rhonchi  ABD:  Bowel sounds present. Soft, Nontender, Nondistended.  MSK: no lower extremity edema, or tenderness  SKIN: warm, dry NEURO: grossly normal, moves all extremities appropriately PSYCH: Normal affect and thought content      Laboratory: Recent  Labs  Lab 06/03/19 0655 06/04/19 0547 06/06/19 0320  WBC 12.5* 9.0 9.5  HGB 15.6 15.0 15.0  HCT 46.3 44.3 44.3  PLT 410* 375 358   Recent Labs  Lab 06/03/19 0655 06/04/19 0547 06/06/19 0320  NA 137 133* 137  K 3.8 3.6 4.5  CL 94* 93* 99  CO2 29 30 27   BUN 14 15 13   CREATININE 0.63 0.58* 0.59*  CALCIUM 8.9 8.5* 8.9  GLUCOSE 100* 114* 104*    Imaging/Diagnostic Tests: No results found.  Lyndee Hensen, DO PGY-1, [facility] 06/08/2019 8:23 AM    FPTS Intern pager: [phone], text pages welcome

## 2019-06-08 NOTE — TOC Progression Note (Addendum)
Transition of Care [facility]) - Progression Note    Patient Details  Name: Ivan [name] MRN: [id number] Date of Birth: Sep 23, 1945  [facility]) CM/SW [place], LCSW Phone Number: 06/08/2019, 9:14 AM  Clinical Narrative:    Pasrr number still pending.   Update: Spoke with patient's spouse who reports being upset that she has not heard from an MD in days and that patient is COVID negative but is being kept on the COVID floor where he cannot have visitors. CSW spoke with MD to make him aware; team will consult to see if patient can be moved off isolation floor while waiting on pasrr.   5pm-CSW spoke with patient's wife to make her aware that patient has been moved off of isolation to 6N.    Expected Discharge Plan: [place] Barriers to Discharge: Continued Medical Work up  Expected Discharge Plan and Services Expected Discharge Plan: [place] arrangements for the past 2 months: Single Family Home Expected Discharge Date: 06/04/19                                     Social Determinants of Health (SDOH) Interventions    Readmission Risk Interventions No flowsheet data found.

## 2019-06-08 NOTE — Progress Notes (Signed)
Physical Therapy Treatment Patient Details Name: Ivan [name] MRN: [id number] DOB: 06-May-1946 Today's Date: 06/08/2019    History of Present Illness  74 y.o. male admitted on 05/26/2019 for worsening wound and AMS. Prior admission (COVID+) with UTI and fall with left hip contusion; pt leaving hospital AMA on 05/22/2019. PMH including HTN, HLD, depression, DDD, CABG x4, PTSD, and CAD.    PT Comments    Pt lying in bed on entry having just received pain medication. Pt reluctantly willing to work with therapy. Pt with complaints of muscle spasms in back. Therapy educated pt on presence of muscle spasms with periods of immobility. Pt's bed found to be soaked in urine so encouraged pt to stand with therapy while nursing changed the linens. Pt requires total A for bed mobility, and modAx2 for sit>stand and lateral stepping 3 feet towards HoB. Pt reports increased pain however is happy with progress. D/c plans remain appropriate at this time. PT will continue to follow acutely.      Follow Up Recommendations  SNF     Equipment Recommendations  None recommended by PT       Precautions / Restrictions Precautions Precautions: Fall Precaution Comments: severe back pain Restrictions Weight Bearing Restrictions: No    Mobility  Bed Mobility Overal bed mobility: Needs Assistance Bed Mobility: Rolling;Sidelying to Sit;Sit to Sidelying Rolling: Max assist;+2 for physical assistance Sidelying to sit: Total assist;+2 for physical assistance;+2 for safety/equipment     Sit to sidelying: Total assist;+2 for physical assistance;+2 for safety/equipment General bed mobility comments: two person assist for completing bed mobility, pt taking rest break in sidelying prior to transition to sitting EOB, assist for LEs and trunk elevation  Transfers Overall transfer level: Needs assistance Equipment used: Rolling walker (2 wheeled) Transfers: Sit to/from Stand Sit to Stand: Mod assist;+2 physical  assistance;+2 safety/equipment         General transfer comment: boosting and steadying assist at RW, notable weakness in LEs, but pt able to take few side steps along EOB with +2 minA prior to return to sitting, tolerated standing while RN changes bed linens   Ambulation/Gait Ambulation/Gait assistance: Mod assist;+2 physical assistance Gait Distance (Feet): 2 Feet Assistive device: Rolling walker (2 wheeled) Gait Pattern/deviations: Step-to pattern;Decreased step length - right;Decreased step length - left;Shuffle;Narrow base of support Gait velocity: slowed Gait velocity interpretation: <1.31 ft/sec, indicative of household ambulator General Gait Details: modAx2 for support with lateral stepping with RW 2 feet towards HoB on R,    Stairs             Wheelchair Mobility    Modified Rankin (Stroke Patients Only)       Balance Overall balance assessment: Needs assistance Sitting-balance support: No upper extremity supported;Bilateral upper extremity supported;Feet supported Sitting balance-Leahy Scale: Poor Sitting balance - Comments: close minguard - minA for sitting balance, often reliant on at least single UE support                                    Cognition Arousal/Alertness: Awake/alert Behavior During Therapy: Agitated;Anxious;Restless Overall Cognitive Status: Impaired/Different from baseline Area of Impairment: Memory;Safety/judgement;Problem solving;Attention;Following commands;Awareness                   Current Attention Level: Selective Memory: Decreased short-term memory Following Commands: Follows one step commands inconsistently;Follows one step commands with increased time Safety/Judgement: Decreased awareness of safety;Decreased awareness of deficits Awareness: Emergent  Problem Solving: Slow processing;Requires verbal cues;Requires tactile cues General Comments: pt continues to remain quite focused on pain management and  regimen, benefits from redirection for functional tasks          General Comments General comments (skin integrity, edema, etc.): VSS on RA      Pertinent Vitals/Pain Pain Assessment: Faces Faces Pain Scale: Hurts whole lot Pain Location: back Pain Descriptors / Indicators: Aching;Grimacing;Guarding;Discomfort;Constant Pain Intervention(s): Limited activity within patient's tolerance;Monitored during session;Repositioned           PT Goals (current goals can now be found in the care plan section) Acute Rehab PT Goals Patient Stated Goal: Stop back pain PT Goal Formulation: Patient unable to participate in goal setting Time For Goal Achievement: 06/10/19 Potential to Achieve Goals: Fair Progress towards PT goals: Progressing toward goals    Frequency    Min 2X/week      PT Plan Current plan remains appropriate    Co-evaluation PT/OT/SLP Co-Evaluation/Treatment: Yes Reason for Co-Treatment: Complexity of the patient's impairments (multi-system involvement) PT goals addressed during session: Mobility/safety with mobility OT goals addressed during session: ADL's and self-care      AM-PAC PT "6 Clicks" Mobility   Outcome Measure  Help needed turning from your back to your side while in a flat bed without using bedrails?: Total Help needed moving from lying on your back to sitting on the side of a flat bed without using bedrails?: Total Help needed moving to and from a bed to a chair (including a wheelchair)?: Total Help needed standing up from a chair using your arms (e.g., wheelchair or bedside chair)?: Total Help needed to walk in hospital room?: Total Help needed climbing 3-5 steps with a railing? : Total 6 Click Score: 6    End of Session Equipment Utilized During Treatment: Gait belt Activity Tolerance: Patient limited by pain Patient left: in bed;with call bell/phone within reach;with nursing/sitter in room Nurse Communication: Mobility status PT Visit  Diagnosis: Other (comment);Muscle weakness (generalized) (M62.81);Difficulty in walking, not elsewhere classified (R26.2);Other abnormalities of gait and mobility (R26.89);Unsteadiness on feet (R26.81);History of falling (Z91.81)     Time: [id number] PT Time Calculation (min) (ACUTE ONLY): 24 min  Charges:  $Therapeutic Activity: 8-22 mins                     Lis Savitt B. Migdalia Dk PT, DPT Acute Rehabilitation Services Pager [phone] Office [phone]    [place] 06/08/2019, 2:10 PM

## 2019-06-09 MED ORDER — NAPROXEN 250 MG PO TABS: 250.0000 mg | ORAL_TABLET | Freq: Three times a day (TID) | ORAL | 0 refills | Status: AC | PRN

## 2019-06-09 NOTE — NC FL2 (Signed)
[place] LEVEL OF CARE SCREENING TOOL     IDENTIFICATION  Patient Name: Ivan [name] Birthdate: 11-23-1945 Sex: male Admission Date (Current Location): 05/26/2019  [facility] and [place] Number:  Herbalist and Address:  [facility]. [facility], [place] [address], [place], [place] [address]      Provider Number: [id number]  Attending Physician Name and Address:  Zenia Resides, MD  Relative Name and Phone Number:  Arvil Chaco, [phone]    Current Level of Care: Hospital Recommended Level of Care: [place] Prior Approval Number:    Date Approved/Denied:   PASRR Number:   [id number] F Expires 07/08/19  Discharge Plan: SNF    Current Diagnoses: Patient Active Problem List   Diagnosis Date Noted  . Pressure injury of skin 05/28/2019  . Delirium due to general medical condition   . Traumatic rhabdomyolysis ([place])   . Wound, open, hip or thigh with complication, left, initial encounter   . Left against medical advice   . Metabolic encephalopathy   . Elevated CK   . Generalized weakness   . Slurred speech   . Pneumonia due to COVID-19 virus 05/19/2019  . AMS (altered mental status) 05/18/2019  . Benign prostatic hyperplasia with urinary obstruction 12/15/2015  . Pleurisy 08/10/2015  . Depression with anxiety 08/10/2015  . Urinary frequency 08/10/2015  . Insomnia 08/10/2015  . Other vascular headache   . Thrombus   . TIA (transient ischemic attack)   . SOB (shortness of breath)   . Stroke ([place])   . Essential hypertension   . Hyperlipidemia   . ST elevation myocardial infarction (STEMI) involving right coronary artery in recovery phase ([place]) 10/12/2014  . DDD (degenerative disc disease), lumbar 05/16/2014  . Narcolepsy   . CAD (coronary artery disease)   . PTSD (post-traumatic stress disorder)     Orientation RESPIRATION BLADDER Height & Weight     Self  Normal Continent Weight: 189 lb 6 oz (85.9  kg) Height:  5\' 10"  (177.8 cm)  BEHAVIORAL SYMPTOMS/MOOD NEUROLOGICAL BOWEL NUTRITION STATUS      Continent Diet(See discharge summary)  AMBULATORY STATUS COMMUNICATION OF NEEDS Skin   Total Care Verbally PU Stage and Appropriate Care, Skin abrasions     PU Stage 3 Dressing: BID                 Personal Care Assistance Level of Assistance  Bathing, Feeding, Dressing Bathing Assistance: Maximum assistance Feeding assistance: Maximum assistance Dressing Assistance: Maximum assistance     Functional Limitations Info  Sight, Hearing, Speech Sight Info: Impaired(Impaired, right and Left) Hearing Info: Adequate Speech Info: Impaired(Difficulty)    SPECIAL CARE FACTORS FREQUENCY  PT (By licensed PT), OT (By licensed OT), Speech therapy     PT Frequency: 5x week OT Frequency: 5x week     Speech Therapy Frequency: 5x week      Contractures Contractures Info: Not present    Additional Factors Info  Code Status, Allergies, Isolation Precautions Code Status Info: Full Allergies Info: Contrast Media iodinated Diagnostic Agents     Isolation Precautions Info: Covid Positive     Current Medications (06/09/2019):  This is the current hospital active medication list Current Facility-Administered Medications  Medication Dose Route Frequency Provider Last Rate Last Admin  . acetaminophen (TYLENOL) tablet 650 mg  650 mg Oral Q6H PRN Bonnita Hollow, MD   650 mg at 06/09/19 0130  . amLODipine (NORVASC) tablet 10 mg  10 mg  Oral Daily Darrelyn Hillock N, DO   10 mg at 06/08/19 [id number]  . aspirin EC tablet 81 mg  81 mg Oral Daily Stark Klein, MD   81 mg at 06/08/19 0909  . diclofenac Sodium (VOLTAREN) 1 % topical gel 2 g  2 g Topical QID Stark Klein, MD   2 g at 06/08/19 2142  . DULoxetine (CYMBALTA) DR capsule 30 mg  30 mg Oral Daily Welborn, Ryan, DO   30 mg at 06/08/19 0909  . enoxaparin (LOVENOX) injection 40 mg  40 mg Subcutaneous Q24H Stark Klein, MD   40 mg at  06/08/19 0909  . finasteride (PROSCAR) tablet 5 mg  5 mg Oral Daily Bonnita Hollow, MD   5 mg at 06/08/19 0909  . lidocaine (LIDODERM) 5 % 1 patch  1 patch Transdermal Q24H Darrelyn Hillock N, DO   1 patch at 06/08/19 2142  . lisinopril (ZESTRIL) tablet 40 mg  40 mg Oral Daily Stark Klein, MD   40 mg at 06/08/19 0909  . naproxen (NAPROSYN) tablet 250 mg  250 mg Oral Q8H PRN Bonnita Hollow, MD   250 mg at 06/08/19 2011  . rosuvastatin (CRESTOR) tablet 10 mg  10 mg Oral Daily Bonnita Hollow, MD   10 mg at 06/08/19 0909  . tamsulosin (FLOMAX) capsule 0.8 mg  0.8 mg Oral Daily Bonnita Hollow, MD   0.8 mg at 06/08/19 0908  . traMADol (ULTRAM) tablet 50 mg  50 mg Oral Q6H PRN Darrelyn Hillock N, DO   50 mg at 06/09/19 0345     Discharge Medications: Please see discharge summary for a list of discharge medications.  Relevant Imaging Results:  Relevant Lab Results:   Additional Information ss# [id number], Covid positive  Benard Halsted, LCSW

## 2019-06-09 NOTE — Progress Notes (Signed)
Attempt made to give report to [place]; Not available to take report at this time.

## 2019-06-09 NOTE — TOC Transition Note (Signed)
Transition of Care [facility]) - CM/SW Discharge Note   Patient Details  Name: FREDRICH HEISSER MRN: [id number] Date of Birth: 1945-06-03  Transition of Care [facility]) CM/SW Contact:  Benard Halsted, LCSW Phone Number: 06/09/2019, 4:14 PM   Clinical Narrative:    Patient will DC to: [facility] Anticipated DC date: 06/09/19 Family notified: Spouse, Administrator, Civil Service by: Corey Harold   Per MD patient ready for DC to [place]. RN, patient, patient's family, and facility notified of DC. Discharge Summary and FL2 sent to facility. RN to call report prior to discharge [phone]). DC packet on chart. Ambulance transport requested for patient.   CSW will sign off for now as social work intervention is no longer needed. Please consult [place] again if new needs arise.  Cedric Fishman, LCSW Clinical Social Worker [phone]    Final next level of care: Skilled Nursing Facility Barriers to Discharge: No Barriers Identified   Patient Goals and CMS Choice Patient states their goals for this hospitalization and ongoing recovery are:: Patient attempted to  participate in goals of care with wife and both agree to rehab placement before returning home. CMS Medicare.gov Compare Post Acute Care list provided to:: Patient Represenative (must comment) Choice offered to / list presented to : Spouse  Discharge Placement PASRR number recieved: 06/09/19            Patient [facility] bed at: [facility] Patient to be transferred to facility by: [place] Name of family member notified: Spouse, Neoma Laming Patient and family notified of of transfer: 06/09/19  Discharge Plan and Services                                     Social Determinants of Health (SDOH) Interventions     Readmission Risk Interventions No flowsheet data found.

## 2019-06-09 NOTE — TOC Progression Note (Signed)
Transition of Care [facility]) - Progression Note    Patient Details  Name: Ivan [name] MRN: [id number] Date of Birth: Feb 16, 1946  Transition of Care [facility]) CM/SW [place], LCSW Phone Number: 06/09/2019, 10:01 AM  Clinical Narrative:    Pasrr approved: [id number] F. Patient able to discharge to [place] today. MD paged.    Expected Discharge Plan: [place] Barriers to Discharge: Continued Medical Work up  Expected Discharge Plan and Services Expected Discharge Plan: [place] arrangements for the past 2 months: Single Family Home Expected Discharge Date: 06/04/19                                     Social Determinants of Health (SDOH) Interventions    Readmission Risk Interventions No flowsheet data found.

## 2019-06-09 NOTE — Progress Notes (Signed)
Second attempt made to give report to [place]; No answer; Lesly Rubenstein stated they were in rounds.

## 2019-06-09 NOTE — Progress Notes (Signed)
Family Medicine Teaching Service Daily Progress Note Intern Pager: [phone]  Patient name: Ivan [name] Medical record number: [id number] Date of birth: 1945/09/17 Age: 74 y.o. Gender: male  Primary Care Provider: Sherald Hess., MD Consultants: Wound care, general surgery Code Status: Full code  Pt Overview and Major Events to Date:  1/13: Patient admitted for altered mental status and worsening pressure injury to left hip, started on vancomycin and cefepime, Lovenox at PE treatment level  Assessment and Plan: Ivan [name] is a 74 y.o. male admitted for AMS concerning for sepsis and COVID + with necrotic hip wound. PMH is significant for HTN, HLD, CAD, +COVID-19 (neg x2 1/18 and 1/20).   AMS  Sepsis, stable AMS resolved.  PASSAR accepted and patient to discharge to [facility] today.   -Medically stable for discharge -PT/OT recommend SNF     Necrotic Left Hip Pressure Ulcer, improving Per general surgery continue wet to dry dressing (BID) to soften eschar. Continues to improve.  - Follow up outpatient with wound clinic  - Naproxen and tylenol prn pain - General surgery recommendations appreciated: Wet-to-dry dressing and attempt to slough off eschar  COVID +, Resolved  Negative repeat covid tests as of 1/18 and 1/20.  Stable on room air throughout admission. -Monitor resp status with vitals   CAD, previous MI: Chronic, stable. Previous STEMI in 2016 and MI in 2012 per chart review.  His home medications include managed on lisinopril, nebivolol, aspirin, and Crestor at home. Unsure if patient has been taking beta blocker at home.  - continue home ASA and statin - naproxen over ibuprofen as this has a better cardiac profile   Previous CVA: Chronic, stable. No known residual deficits.  Per chart review appears to be on Plavix, however was not on his outpatient medication list and his wife has been unclear about his medications.  Recent brain MRI on 1/5 without acute  intracranial abnormalities. -continue ASA and statin   Hypertension: Chronic, stable and improved. BP 102 / 66. Normotensive.  -continue amlodipine 10mg  and lisinopril daily - monitor BP   Hyperlipidemia: Chronic, stable. Takes rosuvastatin at home for secondary prevention. -continue statin    Depression/anxiety: Chronic, stable. He is reported to have been on numerous medications for this including Wellbutrin, Xanax, hydroxyzine, Restoril.   -Recommend discontinuation of Wellbutrin on discharge and replacement with Cymbalta on 1/20.    Back pain/degenerative disc disease: Stable. Continues to report intermittent back pain.  -Baclofen 10mg  3 times daily - Tylenol and Naproxen prn pain  -Tramadol for breakthrough -Voltaren gel and K pad   FEN/GI: heart healthy diet  PPx: Lovenox   Disposition: Medically stable for discharge  Subjective:  Ivan [name]  Had no significant events.  Reports back spasms again this morning.     Objective: Temp:  [97.9 F (36.6 C)-98.3 F (36.8 C)] 98.1 F (36.7 C) (01/27 0538) Pulse Rate:  [95-98] 97 (01/27 0538) Resp:  [17-19] 19 (01/27 0538) BP: (101-104)/(66-69) 102/66 (01/27 0538) SpO2:  [95 %-99 %] 99 % (01/27 0538)  Physical Exam: GEN: elderly male in no acute distress  CV: regular rate and rhythm, no murmurs appreciated  RESP: no increased work of breathing, clear to ascultation bilaterally with no crackles, wheezes, or rhonchi  ABD: Bowel sounds present. Soft, mild suprapubic tenderness , Nondistended.  MSK: no edema, or cyanosis noted SKIN: warm, dry NEURO: grossly normal, moves all extremities appropriately PSYCH: Normal affect and thought content  Laboratory: Recent Labs  Lab 06/03/19 0655 06/04/19 0547 06/06/19 0320  WBC 12.5* 9.0 9.5  HGB 15.6 15.0 15.0  HCT 46.3 44.3 44.3  PLT 410* 375 358   Recent Labs  Lab 06/03/19 0655 06/04/19 0547 06/06/19 0320  NA 137 133* 137  K 3.8 3.6 4.5  CL 94* 93*  99  CO2 29 30 27   BUN 14 15 13   CREATININE 0.63 0.58* 0.59*  CALCIUM 8.9 8.5* 8.9  GLUCOSE 100* 114* 104*    Imaging/Diagnostic Tests: No results found.    Lyndee Hensen, DO PGY-1, [facility] 06/09/2019 7:07 AM    FPTS Intern pager: [phone], text pages welcome

## 2019-06-11 ENCOUNTER — Ambulatory Visit (INDEPENDENT_AMBULATORY_CARE_PROVIDER_SITE_OTHER): Payer: Medicare HMO | Admitting: Family Medicine

## 2019-06-11 ENCOUNTER — Encounter: Payer: Self-pay | Admitting: Family Medicine

## 2019-06-11 ENCOUNTER — Other Ambulatory Visit: Payer: Self-pay

## 2019-06-11 DIAGNOSIS — M4807 Spinal stenosis, lumbosacral region: Secondary | ICD-10-CM

## 2019-06-11 DIAGNOSIS — G8929 Other chronic pain: Secondary | ICD-10-CM | POA: Diagnosis not present

## 2019-06-11 DIAGNOSIS — M545 Low back pain: Secondary | ICD-10-CM | POA: Diagnosis not present

## 2019-06-11 MED ORDER — BACLOFEN 10 MG PO TABS: 5.0000 mg | ORAL_TABLET | Freq: Three times a day (TID) | ORAL | 3 refills | Status: DC | PRN
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
## 2019-06-12 ENCOUNTER — Encounter: Payer: Self-pay | Admitting: Family Medicine

## 2019-06-14 ENCOUNTER — Telehealth: Payer: Self-pay | Admitting: Family Medicine

## 2019-06-14 ENCOUNTER — Telehealth: Payer: Self-pay | Admitting: Radiology

## 2019-06-14 NOTE — Telephone Encounter (Signed)
Voicemail was left on my phone from Friday afternoon.  [facility] called and would like call back for clarification on Baclofen. Please call Debby [facility] of Nursing at [phone].

## 2019-06-14 NOTE — Telephone Encounter (Signed)
I called Ivan [name] back and was sent to her voice mail. I left the directions for the baclofen (1/2 - 1 tid prn back spasm) on her voice mail. Advised her to give me a call back if she has any other questions/concerns.

## 2019-06-14 NOTE — Telephone Encounter (Signed)
Made in error

## 2019-06-15 ENCOUNTER — Telehealth: Payer: Self-pay

## 2019-06-15 ENCOUNTER — Other Ambulatory Visit: Payer: Medicare HMO

## 2019-06-15 NOTE — Telephone Encounter (Signed)
Patient's wife Neoma Laming was returning a call concerning patient.  CB# [phone].  Please advise.  Thank you.

## 2019-06-16 NOTE — Telephone Encounter (Signed)
done

## 2019-06-21 ENCOUNTER — Telehealth: Payer: Self-pay

## 2019-06-21 NOTE — Telephone Encounter (Signed)
Patient called wanting paper for short term disability, however we do not handle that portion . Patient thought that Sherrie Mustache was his PCP but she saw him for a visit in [place].  His Primary Dr. Is Kellie Shropshire

## 2019-06-22 ENCOUNTER — Ambulatory Visit
Admission: RE | Admit: 2019-06-22 | Discharge: 2019-06-22 | Disposition: A | Discharge status: Home / Self Care | Payer: Medicare HMO | Source: Ambulatory Visit | Attending: Family Medicine | Admitting: Family Medicine

## 2019-06-22 ENCOUNTER — Other Ambulatory Visit: Payer: Self-pay

## 2019-06-22 DIAGNOSIS — M4807 Spinal stenosis, lumbosacral region: Secondary | ICD-10-CM

## 2019-06-23 ENCOUNTER — Telehealth: Payer: Self-pay | Admitting: Physical Medicine and Rehabilitation

## 2019-06-23 ENCOUNTER — Telehealth: Payer: Self-pay | Admitting: Family Medicine

## 2019-06-23 DIAGNOSIS — G8929 Other chronic pain: Secondary | ICD-10-CM

## 2019-06-23 DIAGNOSIS — M545 Low back pain, unspecified: Secondary | ICD-10-CM

## 2019-06-23 DIAGNOSIS — K6812 Psoas muscle abscess: Secondary | ICD-10-CM

## 2019-06-23 DIAGNOSIS — M4646 Discitis, unspecified, lumbar region: Secondary | ICD-10-CM

## 2019-06-23 NOTE — Telephone Encounter (Signed)
I spoke to the patient about his lumbar MRI results.  The MRI is concerning for possible discitis-osteomyelitis at L1 to with a possible 1 cm left psoas abscess.  Overall he has been feeling somewhat better.  He is able to stand up for a very short period of time now, but he still has severe back pain.  He has not had any fevers, chills, night sweats.  It takes 48 hours to arrange transportation.  He already has an appointment scheduled with me tomorrow morning.  We will order C-reactive protein and sed rate as well as another CBC with differential.  I will also go ahead and arrange consultation with Dr. Vertell Limber who has treated patient's wife successfully in the past.

## 2019-06-24 ENCOUNTER — Encounter: Payer: Self-pay | Admitting: Family Medicine

## 2019-06-24 ENCOUNTER — Other Ambulatory Visit: Payer: Self-pay

## 2019-06-24 ENCOUNTER — Ambulatory Visit (INDEPENDENT_AMBULATORY_CARE_PROVIDER_SITE_OTHER): Payer: Medicare HMO | Admitting: Family Medicine

## 2019-06-24 DIAGNOSIS — K6812 Psoas muscle abscess: Secondary | ICD-10-CM

## 2019-06-24 DIAGNOSIS — M4646 Discitis, unspecified, lumbar region: Secondary | ICD-10-CM
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
## 2019-06-25 ENCOUNTER — Inpatient Hospital Stay (HOSPITAL_COMMUNITY)
Admission: EM | Admit: 2019-06-25 | Discharge: 2019-07-04 | DRG: 463 | Disposition: A | Discharge status: Home Health | Payer: Medicare HMO | Source: Skilled Nursing Facility | Attending: Family Medicine | Admitting: Family Medicine

## 2019-06-25 ENCOUNTER — Telehealth: Payer: Self-pay | Admitting: Family Medicine

## 2019-06-25 ENCOUNTER — Other Ambulatory Visit: Payer: Self-pay

## 2019-06-25 ENCOUNTER — Telehealth: Payer: Self-pay

## 2019-06-25 ENCOUNTER — Encounter (HOSPITAL_COMMUNITY): Payer: Self-pay

## 2019-06-25 DIAGNOSIS — R5381 Other malaise: Secondary | ICD-10-CM | POA: Diagnosis not present

## 2019-06-25 DIAGNOSIS — Z87440 Personal history of urinary (tract) infections: Secondary | ICD-10-CM | POA: Diagnosis not present

## 2019-06-25 DIAGNOSIS — Z87891 Personal history of nicotine dependence: Secondary | ICD-10-CM

## 2019-06-25 DIAGNOSIS — R339 Retention of urine, unspecified: Secondary | ICD-10-CM | POA: Diagnosis present

## 2019-06-25 DIAGNOSIS — Z85828 Personal history of other malignant neoplasm of skin: Secondary | ICD-10-CM | POA: Diagnosis not present

## 2019-06-25 DIAGNOSIS — Z951 Presence of aortocoronary bypass graft: Secondary | ICD-10-CM

## 2019-06-25 DIAGNOSIS — E7800 Pure hypercholesterolemia, unspecified: Secondary | ICD-10-CM | POA: Diagnosis present

## 2019-06-25 DIAGNOSIS — I2583 Coronary atherosclerosis due to lipid rich plaque: Secondary | ICD-10-CM | POA: Diagnosis not present

## 2019-06-25 DIAGNOSIS — M8618 Other acute osteomyelitis, other site: Secondary | ICD-10-CM

## 2019-06-25 DIAGNOSIS — Z91041 Radiographic dye allergy status: Secondary | ICD-10-CM | POA: Diagnosis not present

## 2019-06-25 DIAGNOSIS — Z8673 Personal history of transient ischemic attack (TIA), and cerebral infarction without residual deficits: Secondary | ICD-10-CM

## 2019-06-25 DIAGNOSIS — R32 Unspecified urinary incontinence: Secondary | ICD-10-CM | POA: Diagnosis present

## 2019-06-25 DIAGNOSIS — I444 Left anterior fascicular block: Secondary | ICD-10-CM | POA: Diagnosis present

## 2019-06-25 DIAGNOSIS — Z7982 Long term (current) use of aspirin: Secondary | ICD-10-CM

## 2019-06-25 DIAGNOSIS — G8929 Other chronic pain: Secondary | ICD-10-CM | POA: Diagnosis present

## 2019-06-25 DIAGNOSIS — Z8616 Personal history of COVID-19: Secondary | ICD-10-CM | POA: Diagnosis not present

## 2019-06-25 DIAGNOSIS — W1839XA Other fall on same level, initial encounter: Secondary | ICD-10-CM | POA: Diagnosis not present

## 2019-06-25 DIAGNOSIS — E785 Hyperlipidemia, unspecified: Secondary | ICD-10-CM | POA: Diagnosis present

## 2019-06-25 DIAGNOSIS — M5136 Other intervertebral disc degeneration, lumbar region: Secondary | ICD-10-CM | POA: Diagnosis not present

## 2019-06-25 DIAGNOSIS — R252 Cramp and spasm: Secondary | ICD-10-CM | POA: Diagnosis present

## 2019-06-25 DIAGNOSIS — Z79899 Other long term (current) drug therapy: Secondary | ICD-10-CM | POA: Diagnosis not present

## 2019-06-25 DIAGNOSIS — Z981 Arthrodesis status: Secondary | ICD-10-CM | POA: Diagnosis not present

## 2019-06-25 DIAGNOSIS — L8995 Pressure ulcer of unspecified site, unstageable: Secondary | ICD-10-CM | POA: Diagnosis not present

## 2019-06-25 DIAGNOSIS — I2510 Atherosclerotic heart disease of native coronary artery without angina pectoris: Secondary | ICD-10-CM | POA: Diagnosis present

## 2019-06-25 DIAGNOSIS — K6812 Psoas muscle abscess: Secondary | ICD-10-CM

## 2019-06-25 DIAGNOSIS — F418 Other specified anxiety disorders: Secondary | ICD-10-CM | POA: Diagnosis present

## 2019-06-25 DIAGNOSIS — B952 Enterococcus as the cause of diseases classified elsewhere: Secondary | ICD-10-CM | POA: Diagnosis present

## 2019-06-25 DIAGNOSIS — M869 Osteomyelitis, unspecified: Secondary | ICD-10-CM | POA: Diagnosis not present

## 2019-06-25 DIAGNOSIS — Y999 Unspecified external cause status: Secondary | ICD-10-CM | POA: Diagnosis not present

## 2019-06-25 DIAGNOSIS — L89229 Pressure ulcer of left hip, unspecified stage: Secondary | ICD-10-CM | POA: Diagnosis not present

## 2019-06-25 DIAGNOSIS — Z791 Long term (current) use of non-steroidal anti-inflammatories (NSAID): Secondary | ICD-10-CM

## 2019-06-25 DIAGNOSIS — I10 Essential (primary) hypertension: Secondary | ICD-10-CM | POA: Diagnosis present

## 2019-06-25 DIAGNOSIS — E1169 Type 2 diabetes mellitus with other specified complication: Secondary | ICD-10-CM | POA: Diagnosis present

## 2019-06-25 DIAGNOSIS — M1990 Unspecified osteoarthritis, unspecified site: Secondary | ICD-10-CM | POA: Diagnosis present

## 2019-06-25 DIAGNOSIS — G4700 Insomnia, unspecified: Secondary | ICD-10-CM | POA: Diagnosis present

## 2019-06-25 DIAGNOSIS — I252 Old myocardial infarction: Secondary | ICD-10-CM | POA: Diagnosis not present

## 2019-06-25 DIAGNOSIS — N400 Enlarged prostate without lower urinary tract symptoms: Secondary | ICD-10-CM | POA: Diagnosis not present

## 2019-06-25 DIAGNOSIS — E875 Hyperkalemia: Secondary | ICD-10-CM | POA: Diagnosis present

## 2019-06-25 DIAGNOSIS — M4646 Discitis, unspecified, lumbar region: Principal | ICD-10-CM

## 2019-06-25 DIAGNOSIS — Z9841 Cataract extraction status, right eye: Secondary | ICD-10-CM

## 2019-06-25 DIAGNOSIS — Z8701 Personal history of pneumonia (recurrent): Secondary | ICD-10-CM | POA: Diagnosis not present

## 2019-06-25 DIAGNOSIS — M7061 Trochanteric bursitis, right hip: Secondary | ICD-10-CM | POA: Diagnosis not present

## 2019-06-25 DIAGNOSIS — S71102A Unspecified open wound, left thigh, initial encounter: Secondary | ICD-10-CM | POA: Diagnosis not present

## 2019-06-25 DIAGNOSIS — R3911 Hesitancy of micturition: Secondary | ICD-10-CM | POA: Diagnosis not present

## 2019-06-25 DIAGNOSIS — M4626 Osteomyelitis of vertebra, lumbar region: Secondary | ICD-10-CM | POA: Diagnosis present

## 2019-06-25 DIAGNOSIS — R531 Weakness: Secondary | ICD-10-CM | POA: Diagnosis present

## 2019-06-25 DIAGNOSIS — Y929 Unspecified place or not applicable: Secondary | ICD-10-CM | POA: Diagnosis not present

## 2019-06-25 DIAGNOSIS — L89224 Pressure ulcer of left hip, stage 4: Secondary | ICD-10-CM | POA: Diagnosis present

## 2019-06-25 DIAGNOSIS — L8994 Pressure ulcer of unspecified site, stage 4: Secondary | ICD-10-CM | POA: Diagnosis present

## 2019-06-25 DIAGNOSIS — N401 Enlarged prostate with lower urinary tract symptoms: Secondary | ICD-10-CM | POA: Diagnosis present

## 2019-06-25 DIAGNOSIS — Z9842 Cataract extraction status, left eye: Secondary | ICD-10-CM

## 2019-06-25 DIAGNOSIS — S71002A Unspecified open wound, left hip, initial encounter: Secondary | ICD-10-CM | POA: Diagnosis not present

## 2019-06-25 DIAGNOSIS — Z808 Family history of malignant neoplasm of other organs or systems: Secondary | ICD-10-CM

## 2019-06-25 DIAGNOSIS — L89209 Pressure ulcer of unspecified hip, unspecified stage: Secondary | ICD-10-CM | POA: Diagnosis not present

## 2019-06-25 DIAGNOSIS — L89626 Pressure-induced deep tissue damage of left heel: Secondary | ICD-10-CM | POA: Diagnosis present

## 2019-06-25 DIAGNOSIS — I119 Hypertensive heart disease without heart failure: Secondary | ICD-10-CM | POA: Diagnosis not present

## 2019-06-25 DIAGNOSIS — M4640 Discitis, unspecified, site unspecified: Secondary | ICD-10-CM

## 2019-06-25 DIAGNOSIS — Z79891 Long term (current) use of opiate analgesic: Secondary | ICD-10-CM

## 2019-06-25 DIAGNOSIS — Y9389 Activity, other specified: Secondary | ICD-10-CM | POA: Diagnosis not present

## 2019-06-25 LAB — CBC WITH DIFFERENTIAL/PLATELET
Abs Immature Granulocytes: 0.04 10*3/uL (ref 0.00–0.07)
Absolute Monocytes: 819 cells/uL (ref 200–950)
Basophils Absolute: 25 cells/uL (ref 0–200)
Basophils Absolute: 0 10*3/uL (ref 0.0–0.1)
Basophils Relative: 0.2 %
Basophils Relative: 0 %
Eosinophils Absolute: 176 cells/uL (ref 15–500)
Eosinophils Absolute: 0.1 10*3/uL (ref 0.0–0.5)
Eosinophils Relative: 1.4 %
Eosinophils Relative: 1 %
HCT: 42.6 % (ref 38.5–50.0)
HCT: 43.3 % (ref 39.0–52.0)
Hemoglobin: 15 g/dL (ref 13.2–17.1)
Hemoglobin: 14 g/dL (ref 13.0–17.0)
Immature Granulocytes: 0 %
Lymphocytes Relative: 8 %
Lymphs Abs: 1474 cells/uL (ref 850–3900)
Lymphs Abs: 0.8 10*3/uL (ref 0.7–4.0)
MCH: 32 pg (ref 27.0–33.0)
MCH: 31 pg (ref 26.0–34.0)
MCHC: 35.2 g/dL (ref 32.0–36.0)
MCHC: 32.3 g/dL (ref 30.0–36.0)
MCV: 90.8 fL (ref 80.0–100.0)
MCV: 95.8 fL (ref 80.0–100.0)
MPV: 9.9 fL (ref 7.5–12.5)
Monocytes Absolute: 0.6 10*3/uL (ref 0.1–1.0)
Monocytes Relative: 6.5 %
Monocytes Relative: 6 %
Neutro Abs: 10105 cells/uL — ABNORMAL HIGH (ref 1500–7800)
Neutro Abs: 8.1 10*3/uL — ABNORMAL HIGH (ref 1.7–7.7)
Neutrophils Relative %: 80.2 %
Neutrophils Relative %: 85 %
Platelets: 469 10*3/uL — ABNORMAL HIGH (ref 140–400)
Platelets: 365 10*3/uL (ref 150–400)
RBC: 4.69 10*6/uL (ref 4.20–5.80)
RBC: 4.52 MIL/uL (ref 4.22–5.81)
RDW: 12.5 % (ref 11.0–15.0)
RDW: 13.1 % (ref 11.5–15.5)
Total Lymphocyte: 11.7 %
WBC: 12.6 10*3/uL — ABNORMAL HIGH (ref 3.8–10.8)
WBC: 9.5 10*3/uL (ref 4.0–10.5)
nRBC: 0 % (ref 0.0–0.2)

## 2019-06-25 LAB — BASIC METABOLIC PANEL
Anion gap: 10 (ref 5–15)
BUN: 20 mg/dL (ref 8–23)
CO2: 24 mmol/L (ref 22–32)
Calcium: 8.9 mg/dL (ref 8.9–10.3)
Chloride: 101 mmol/L (ref 98–111)
Creatinine, Ser: 0.6 mg/dL — ABNORMAL LOW (ref 0.61–1.24)
GFR calc Af Amer: >60 mL/min (ref >60)
GFR calc non Af Amer: >60 mL/min (ref >60)
Glucose, Bld: 154 mg/dL — ABNORMAL HIGH (ref 70–99)
Potassium: 4.3 mmol/L (ref 3.5–5.1)
Sodium: 135 mmol/L (ref 135–145)

## 2019-06-25 LAB — URINALYSIS, ROUTINE W REFLEX MICROSCOPIC
Bacteria, UA: NONE SEEN
Bilirubin Urine: NEGATIVE
Glucose, UA: NEGATIVE mg/dL
Ketones, ur: NEGATIVE mg/dL
Leukocytes,Ua: NEGATIVE
Nitrite: NEGATIVE
Protein, ur: NEGATIVE mg/dL
RBC / HPF: >50 RBC/hpf — ABNORMAL HIGH (ref 0–5)
Specific Gravity, Urine: 1.02 (ref 1.005–1.030)
pH: 6 (ref 5.0–8.0)

## 2019-06-25 LAB — C-REACTIVE PROTEIN: CRP: 23.6 mg/L — ABNORMAL HIGH (ref <8.0)

## 2019-06-25 LAB — SEDIMENTATION RATE
Sed Rate: 63 mm/h — ABNORMAL HIGH (ref 0–20)
Sed Rate: 53 mm/hr — ABNORMAL HIGH (ref 0–16)

## 2019-06-25 MED ORDER — DULOXETINE HCL 30 MG PO CPEP
30.0000 mg | ORAL_CAPSULE | Freq: Every day | ORAL | Status: DC
  Administered 2019-06-26 – 2019-07-03 (×8): 30 mg via ORAL
  Filled 2019-06-25 (×8): qty 1

## 2019-06-25 MED ORDER — MAGNESIUM OXIDE 400 (241.3 MG) MG PO TABS: 400.0000 mg | ORAL_TABLET | Freq: Every day | ORAL | Status: DC | PRN

## 2019-06-25 MED ORDER — BACLOFEN 5 MG HALF TABLET
5.0000 mg | ORAL_TABLET | Freq: Three times a day (TID) | ORAL | Status: DC | PRN
  Administered 2019-06-26: 09:00:00 10 mg via ORAL
  Administered 2019-06-27: 5 mg via ORAL
  Administered 2019-06-27: 03:00:00 10 mg via ORAL
  Administered 2019-06-28: 5 mg via ORAL
  Administered 2019-06-28: 10 mg via ORAL
  Administered 2019-06-29: 5 mg via ORAL
  Administered 2019-06-30: 12:00:00 10 mg via ORAL
  Filled 2019-06-25: qty 1
  Filled 2019-06-25 (×4): qty 2
  Filled 2019-06-25: qty 1
  Filled 2019-06-25 (×2): qty 2

## 2019-06-25 MED ORDER — SENNA 8.6 MG PO TABS: 1.0000 | ORAL_TABLET | Freq: Every evening | ORAL | Status: DC | PRN

## 2019-06-25 MED ORDER — FINASTERIDE 5 MG PO TABS
5.0000 mg | ORAL_TABLET | Freq: Every day | ORAL | Status: DC
  Administered 2019-06-26 – 2019-07-04 (×9): 5 mg via ORAL
  Filled 2019-06-25 (×9): qty 1

## 2019-06-25 MED ORDER — OXYCODONE HCL 5 MG PO TABS
5.0000 mg | ORAL_TABLET | ORAL | Status: DC | PRN
  Administered 2019-06-26 – 2019-06-29 (×10): 5 mg via ORAL
  Filled 2019-06-25 (×10): qty 1

## 2019-06-25 MED ORDER — LISINOPRIL 20 MG PO TABS: 40.0000 mg | ORAL_TABLET | Freq: Every day | ORAL | Status: DC

## 2019-06-25 MED ORDER — ACETAMINOPHEN 500 MG PO TABS
1000.0000 mg | ORAL_TABLET | Freq: Four times a day (QID) | ORAL | Status: DC
  Administered 2019-06-26 – 2019-07-04 (×26): 1000 mg via ORAL
  Filled 2019-06-25 (×30): qty 2

## 2019-06-25 MED ORDER — OXYCODONE-ACETAMINOPHEN 5-325 MG PO TABS
1.0000 | ORAL_TABLET | Freq: Once | ORAL | Status: AC
  Administered 2019-06-25: 21:00:00 1 via ORAL

## 2019-06-25 MED ORDER — POLYETHYLENE GLYCOL 3350 17 G PO PACK
17.0000 g | PACK | Freq: Every day | ORAL | Status: DC | PRN
  Administered 2019-06-29 – 2019-07-02 (×3): 17 g via ORAL
  Filled 2019-06-25 (×2): qty 1

## 2019-06-25 MED ORDER — TAMSULOSIN HCL 0.4 MG PO CAPS
0.8000 mg | ORAL_CAPSULE | Freq: Every day | ORAL | Status: DC
  Administered 2019-06-26 – 2019-07-04 (×8): 0.8 mg via ORAL
  Filled 2019-06-25 (×9): qty 2

## 2019-06-25 MED ORDER — ENOXAPARIN SODIUM 40 MG/0.4ML ~~LOC~~ SOLN: 40.0000 mg | SUBCUTANEOUS | Status: DC

## 2019-06-25 MED ORDER — OXYCODONE-ACETAMINOPHEN 5-325 MG PO TABS
1.0000 | ORAL_TABLET | Freq: Once | ORAL | Status: DC
  Filled 2019-06-25: qty 1

## 2019-06-25 MED ORDER — ROSUVASTATIN CALCIUM 5 MG PO TABS
10.0000 mg | ORAL_TABLET | Freq: Every day | ORAL | Status: DC
  Administered 2019-06-26 – 2019-07-04 (×9): 10 mg via ORAL
  Filled 2019-06-25 (×9): qty 2

## 2019-06-25 MED ORDER — ASPIRIN EC 81 MG PO TBEC
81.0000 mg | DELAYED_RELEASE_TABLET | Freq: Every day | ORAL | Status: DC
  Administered 2019-06-26 – 2019-07-04 (×9): 81 mg via ORAL
  Filled 2019-06-25 (×11): qty 1

## 2019-06-25 MED ORDER — AMLODIPINE BESYLATE 10 MG PO TABS
10.0000 mg | ORAL_TABLET | Freq: Every day | ORAL | Status: DC
  Administered 2019-06-26 – 2019-07-04 (×9): 10 mg via ORAL
  Filled 2019-06-25 (×9): qty 1
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
## 2019-06-25 NOTE — Telephone Encounter (Signed)
Antibiotics should not be started until testing has been done (draining the abscess, or possibly getting a sample of the disc).  This should all be arranged by the surgeon.  He could go to the ER if he'd prefer, and see if the ER doctor can contact the neurosurgeon on call (it probably won't be Dr. Vertell Limber).  Since he's overall improved from the time I first saw him, I think it would be ok to wait until Monday.  But the other option is to go to the ER.

## 2019-06-25 NOTE — Telephone Encounter (Signed)
Pt wife called in very upset wanting to speak to dr.hilts about her husbands conditions she states Dr.Stern has canceled the appt for Monday due to the high number on his blood work and told pt wife that he needed to be admitted to ER today and pt is wanting to know if dr.hilts has any privileges to getting him admitted and pre registered and isn't just taken to the hospital to hang out for hours when something can happen to him meanwhile.  Please give her a call   [phone]

## 2019-06-25 NOTE — Telephone Encounter (Signed)
WBC elevated at 12.6; C-reactive protein up at 23 and Sed Rate up at 63.

## 2019-06-25 NOTE — Telephone Encounter (Signed)
I called - they will take him to the ER.

## 2019-06-25 NOTE — ED Notes (Signed)
Pt resting in bed. Pt denies new or worsening complaints. Will continue to monitor. No distress noted. Pt on continuous monitoring via blood pressure, pulse ox, and cardiac monitor.  

## 2019-06-25 NOTE — Telephone Encounter (Signed)
I called the patient's wife and advised her of this.
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
## 2019-06-25 NOTE — Telephone Encounter (Signed)
I uploaded labs to Dr. Vertell Limber office thru [facility].

## 2019-06-25 NOTE — Telephone Encounter (Signed)
Can we fax/send the most recent labs to Dr. Ozzie Hoyle, patient states he didn't receive those

## 2019-06-25 NOTE — Telephone Encounter (Signed)
Pt wife called in said that Dr.Sterns office cant get the pt in for an appt until Monday and she is very concerned about his blood shows massive infection risk she is wondering doesn't he need antibiotics until Monday? Please give pt wife a call she is very concerned. Pt is at a rehab clinic so she isn't sure how to send that medication.    [phone]

## 2019-06-25 NOTE — ED Triage Notes (Signed)
Pt from [facility], stating he had an abnormal labs. Pt has a pressure ulcer on low back.   120/80 80pulse rr16 98%ra 99.1

## 2019-06-26 DIAGNOSIS — Z91041 Radiographic dye allergy status: Secondary | ICD-10-CM

## 2019-06-26 DIAGNOSIS — M4646 Discitis, unspecified, lumbar region: Principal | ICD-10-CM

## 2019-06-26 DIAGNOSIS — Z951 Presence of aortocoronary bypass graft: Secondary | ICD-10-CM

## 2019-06-26 DIAGNOSIS — L89209 Pressure ulcer of unspecified hip, unspecified stage: Secondary | ICD-10-CM

## 2019-06-26 DIAGNOSIS — Z8701 Personal history of pneumonia (recurrent): Secondary | ICD-10-CM

## 2019-06-26 DIAGNOSIS — Z87891 Personal history of nicotine dependence: Secondary | ICD-10-CM

## 2019-06-26 DIAGNOSIS — I2583 Coronary atherosclerosis due to lipid rich plaque: Secondary | ICD-10-CM

## 2019-06-26 DIAGNOSIS — Z87440 Personal history of urinary (tract) infections: Secondary | ICD-10-CM

## 2019-06-26 DIAGNOSIS — Z8616 Personal history of COVID-19: Secondary | ICD-10-CM | POA: Diagnosis not present

## 2019-06-26 DIAGNOSIS — N400 Enlarged prostate without lower urinary tract symptoms: Secondary | ICD-10-CM | POA: Diagnosis present

## 2019-06-26 DIAGNOSIS — I2510 Atherosclerotic heart disease of native coronary artery without angina pectoris: Secondary | ICD-10-CM

## 2019-06-26 DIAGNOSIS — M4626 Osteomyelitis of vertebra, lumbar region: Secondary | ICD-10-CM

## 2019-06-26 DIAGNOSIS — L89229 Pressure ulcer of left hip, unspecified stage: Secondary | ICD-10-CM

## 2019-06-26 DIAGNOSIS — K6812 Psoas muscle abscess: Secondary | ICD-10-CM

## 2019-06-26 LAB — BASIC METABOLIC PANEL
Anion gap: 9 (ref 5–15)
BUN: 20 mg/dL (ref 8–23)
CO2: 26 mmol/L (ref 22–32)
Calcium: 9.1 mg/dL (ref 8.9–10.3)
Chloride: 103 mmol/L (ref 98–111)
Creatinine, Ser: 0.62 mg/dL (ref 0.61–1.24)
GFR calc Af Amer: >60 mL/min (ref >60)
GFR calc non Af Amer: >60 mL/min (ref >60)
Glucose, Bld: 101 mg/dL — ABNORMAL HIGH (ref 70–99)
Potassium: 5.2 mmol/L — ABNORMAL HIGH (ref 3.5–5.1)
Sodium: 138 mmol/L (ref 135–145)

## 2019-06-26 LAB — CBC
HCT: 42.5 % (ref 39.0–52.0)
Hemoglobin: 13.8 g/dL (ref 13.0–17.0)
MCH: 31 pg (ref 26.0–34.0)
MCHC: 32.5 g/dL (ref 30.0–36.0)
MCV: 95.5 fL (ref 80.0–100.0)
Platelets: 350 10*3/uL (ref 150–400)
RBC: 4.45 MIL/uL (ref 4.22–5.81)
RDW: 13 % (ref 11.5–15.5)
WBC: 8.8 10*3/uL (ref 4.0–10.5)
nRBC: 0 % (ref 0.0–0.2)

## 2019-06-26 LAB — SARS CORONAVIRUS 2 (TAT 6-24 HRS): SARS Coronavirus 2: NEGATIVE

## 2019-06-26 LAB — POTASSIUM: Potassium: 4.1 mmol/L (ref 3.5–5.1)

## 2019-06-26 MED ORDER — SODIUM CHLORIDE 0.9 % IV SOLN: 1.0000 g | INTRAVENOUS | Status: DC

## 2019-06-26 MED ORDER — HEPARIN SODIUM (PORCINE) 5000 UNIT/ML IJ SOLN
5000.0000 [IU] | Freq: Three times a day (TID) | INTRAMUSCULAR | Status: AC
  Administered 2019-06-26 – 2019-06-27 (×5): 5000 [IU] via SUBCUTANEOUS
  Filled 2019-06-26 (×5): qty 1

## 2019-06-26 MED ORDER — LIDOCAINE 5 % EX PTCH
1.0000 | MEDICATED_PATCH | CUTANEOUS | Status: DC
  Administered 2019-06-26 – 2019-07-04 (×9): 1 via TRANSDERMAL
  Filled 2019-06-26 (×9): qty 1

## 2019-06-26 NOTE — Progress Notes (Signed)
Family Medicine Teaching Service Daily Progress Note Intern Pager: [phone]  Patient name: Ivan [name] Medical record number: [id number] Date of birth: 11-Sep-1945 Age: 74 y.o. Gender: male  Primary Care Provider: Sherald Hess., MD Consultants: Neuro sx, ID, IR Code Status: FULL  Pt Overview and Major Events to Date:  06/25/2019: Admitted, Neuro sx signed off  Assessment and Plan: Ivan [name] is a 74 y.o. male presenting to the ED at direction of PCP due to increase inflammatory markers and concerning lumbar MRI with possible discitis/osteomyelitis/psoas abscess. PMH is significant for DDD, CAD, HTN, HLD, BPH, recent COVID+, DM, diet controlled.   MRI c/f Lumbar discitis/osteomyelitis and psoas abscess  Hx of DDD   Lumbar MRI (06/22/2019) w/ findings concerning for discitis-osteomyelitis at L1-2 with a possible 1 cm left psoas abscess.  Lab work obtained by PCP 06/24/2019 showed CRP 23.6, WBC 12.6, platelets 469, sed rate 63. Has not had a fever, Tmax 99 F. In the ED, VSS, WBC 9.5, platelets 365, sed rate 53, UA normal.  Blood cultures and urine cultures pending as well as repeat Covid.  On examination he is lying supine in bed and with mild back discomfort at this time as he has received Percocet x1. Has some suprapubic discomfort with palpation.  Bilateral lower extremities are mildly yet noticeably weaker than the upper extremities. Pressure ulcers noted on left hip surface as well as left calcaneal surface.  Neurosurgery was consulted by ED provider and MRI reviewed as there was no cord involvement suggestion was made for ID and IR consult.  ID consulted and recommended IR biopsy before starting antibiotics.  IR consulted and recommended patient be n.p.o. for possible aspiration biopsy tomorrow.  AM CBC wnl. WBC 8.8. K 5.2. -Consult to infectious disease, appreciate recommendations -Consult to interventional radiology, appreciate recommendations -Consult to wound care  appreciate recommendations -Consult to PT/OT eval and treat -N.p.o. sips with meds -Follow-up Covid test -Follow-up blood cultures  -Pain management:             -Continue Tylenol 1000mg  q6h             -Oxy IR 5 mg every 4 hours as needed for breakthrough pain             -New baclofen 10mg   TID for muscle spasms -Continue to monitor potassium, consider lokelma if needed.   BPH  Dysuria c/f UTI On admission has some dysuria. Explained as "peeing razor blades". NO increase in frequency, fever, or foul odor reported. He uses a urinal and has been making urine up to 4-5x/day. No long complaining of  suprapubic discomfort. Home medications are Finasteride 5mg  and Tamsulosin 0.8mg . UA wnl.  -continue home medications  Pressure Ulcers Has a known pressure ulcer on left hip (healing, stage 3-4) with some sloughing open to air w/ no streaking/purulence or noted cellulitis surrounding. Other pressure ulcer on left heel with a closed wound. There are no dressings or coverings on either of these wounds. Likely due to decrease in mobility over the last month. -Consult to wound care, appreciate recommendations  CAD/Hx of MI EKG NSR. No chest pain or shortness of breath currently. Continue home ASA 81mg .  HLD Last lipid panel 02/20/2018 Tot chol 150, LDL 73, HDL 47, triglycerides 107. 05/26/2019 triglycerides 112. Home medications are Crestor 10mg . -Continue home medications  HTN BP on admission 113/73. Currently 109/58. Home medication are Amlodipine 10mg  and Lisinopril 40mg . -Continue home medications -monitor w/ vital signs per protocol  Recent COVID infection Tested positive 05/18/2019. Has since tested negative 06/02/2019. Does not have any respiratory symptoms with this admission but is coming from a skilled nursing facility.  -f/u COVID pending  Depression w/ anxiety Home medication Cymbalta 30mg . Continue home medication.  Diet controlled DM Last A1c 05/20/2019. Glu on admission  BMP 154. Glu this morning 101.  -monitor on BMP  FEN/GI: NPO, sips w/ meds, MiraLAX, senna, Mag-Ox Prophylaxis: SCDs  Disposition: Med-Surg  Subjective:  He is comfortable right now and no longer has suprapubic discomfort. He is thirsty.  Objective: Temp:  [98.6 F (37 C)-98.7 F (37.1 C)] 98.7 F (37.1 C) (02/13 0113) Pulse Rate:  [79-103] 79 (02/13 0113) Resp:  [13-18] 18 (02/13 0113) BP: (100-113)/(68-75) 109/68 (02/13 0113) SpO2:  [97 %-99 %] 99 % (02/13 0113) Weight:  [79.2 kg-81.6 kg] 79.2 kg (02/13 0113)   Physical Exam:  General: Appears well, no acute distress. Age appropriate. Initially sleeping easily woken up. Cardiac: RRR, normal heart sounds, no murmurs Respiratory: CTAB, normal effort Neuro: alert and oriented, no focal deficits   Laboratory: Recent Labs  Lab 06/24/19 0927 06/25/19 2102 06/26/19 0259  WBC 12.6* 9.5 8.8  HGB 15.0 14.0 13.8  HCT 42.6 43.3 42.5  PLT 469* 365 350   Recent Labs  Lab 06/25/19 2102 06/26/19 0259  NA 135 138  K 4.3 5.2*  CL 101 103  CO2 24 26  BUN 20 20  CREATININE 0.60* 0.62  CALCIUM 8.9 9.1  GLUCOSE 154* 101*    Imaging/Diagnostic Tests: MRI LUMBAR SPINE WITHOUT CONTRAST COMPARISON: 02/18/2019 lumbar spine MRI. 05/19/2019 CT abdomen and pelvis. IMPRESSION: 1. Findings concerning for discitis-osteomyelitis at L1-2 with a possible 1 cm left psoas abscess. No evidence of epidural abscess. 2. Less pronounced fluid signal in the T10-11, L2-3, and L5-S1 discs without convincing evidence of osteomyelitis at these levels. 3. Increased, mild spinal stenosis at L1-2. 4. Up to mild spinal and severe neural foraminal stenosis at other levels as above.  Gerlene Fee, DO 06/26/2019, 6:02 AM PGY-1, [place] Intern pager: [phone], text pages welcome

## 2019-06-26 NOTE — Consult Note (Signed)
[facility] Nurse Consult Note: Patient receiving care in [facility].  Consult completed remotely after review of record, including images from January 2021. Reason for Consult: wounds on left hip and left heel Wound type: DTPI to left heel to be protected by foam dressing, change every 3 days.  Unstageable to left hip which I consulted on in January and recommended surgical debridement.  I cannot find that this was followed up on.  I have ordered twice daily saline moistened gauze and ABD pads to the area to promote debridement.. Pressure Injury POA: Yes Measurement: See flowsheet section for this and other details. Wound bed: Drainage (amount, consistency, odor)  Periwound: Dressing procedure/placement/frequency: Monitor the wound area(s) for worsening of condition such as: Signs/symptoms of infection,  Increase in size,  Development of or worsening of odor, Development of pain, or increased pain at the affected locations.  Notify the medical team if any of these develop.  Thank you for the consult.  [facility] nurse will not follow at this time.  Please re-consult the [facility] team if needed.  Val Riles, RN, MSN, CWOCN, CNS-BC, pager [phone]

## 2019-06-26 NOTE — Progress Notes (Signed)
IR aware of request for disc aspiration -- will tentatively plan for procedure Monday 2/15 pending MD review that morning, on call IR MD this weekend does not perform this procedure. Discussed with Dr. Volanda Napoleon today who states understanding.  Will place orders for patient to be NPO at midnight on 2/15, AM labs. Full consult/consent on Monday if proceeding.  Please call IR with questions or concerns.  Candiss Norse, PA-C
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
## 2019-06-26 NOTE — ED Notes (Signed)
This RN tried to call report to 5N x2 with no success.

## 2019-06-26 NOTE — Evaluation (Signed)
Occupational Therapy Evaluation Patient Details Name: Ivan [name] MRN: [id number] DOB: 1946/05/06 Today's Date: 06/26/2019    History of Present Illness Pt is a 74 y/o male admitted secondary to acute on chronic back pain. MRI revealed lumbar discitis/osteomyelitis and psoas abscess. Of note, pt with recent admissions secondary to [place] related AMS with d/c on 1/27 to [facility] SNF. PMH including but not limited to CAD, HTN, HLD, BPH, recent COVID+, DM.   Clinical Impression   Pt with decline in function and safety with ADLs and ADL mobility with impaired strength, endurance and cognition. PTA, pt reported that he has  been "bed-bound" at the SNF and required assistance from staff for ADLs. He reported that the staff would use a Hoyer lift to weigh him; however, that he could not tolerate any upright sitting in a chair or w/c secondary to back pain. Pt also stated that he believes he was on the rehab/therapy caseload but uncertain of what they have been able to do with him. At the time of evaluation, pt limited secondary to pain and not wanting to complete any transfers or upright sitting until the medical team has drained his abscess. Pt able to roll bilaterally in bed for pericare with heavy use of bed rails but no physical assistance required.Pt able to complete grooming and UB ADLs set up - sup at bed level. Uncertain how accurate info provided by pt is at this time. Pt would benefit from acute OT services to address impairments to maximize level of function and safety    Follow Up Recommendations  SNF;Supervision/Assistance - 24 hour    Equipment Recommendations  Other (comment)(TBD at SNF)    Recommendations for Other Services       Precautions / Restrictions Precautions Precautions: Fall Restrictions Weight Bearing Restrictions: No      Mobility Bed Mobility Overal bed mobility: Needs Assistance Bed Mobility: Rolling Rolling: Min guard         General bed  mobility comments: heavy use of bed rails; pt able to roll bilaterally with cueing; total A for pericare; pt refusing to attempt sitting up at EOB  Transfers                 General transfer comment: pt refusing to attempt until after his abscess is drained    Balance       Sitting balance - Comments: refused sitting EOB or long sitting                                   ADL either performed or assessed with clinical judgement   ADL Overall ADL's : Needs assistance/impaired Eating/Feeding: NPO   Grooming: Wash/dry hands;Wash/dry face;Brushing hair;Set up;Bed level   Upper Body Bathing: Set up;Supervision/ safety;Bed level   Lower Body Bathing: Total assistance   Upper Body Dressing : Set up;Supervision/safety;Bed level   Lower Body Dressing: Total assistance     Toilet Transfer Details (indicate cue type and reason): refused to attempt Toileting- Clothing Manipulation and Hygiene: Total assistance;Bed level         General ADL Comments: pt has limitations due to pain, weakness and impaired cognition     Vision Baseline Vision/History: Wears glasses Patient Visual Report: No change from baseline       Perception     Praxis      Pertinent Vitals/Pain Pain Assessment: Faces Faces Pain Scale: Hurts even more Pain Location:  back Pain Descriptors / Indicators: Grimacing;Guarding Pain Intervention(s): Limited activity within patient's tolerance;Monitored during session;Repositioned     Hand Dominance Right   Extremity/Trunk Assessment Upper Extremity Assessment Upper Extremity Assessment: Generalized weakness   Lower Extremity Assessment Lower Extremity Assessment: Defer to PT evaluation RLE Deficits / Details: atrophy noted throughout; however, pt with excellent ROM at all joints on all planes of motion and able to move against gravity without difficulties LLE Deficits / Details: atrophy noted throughout; however, pt with excellent ROM at  all joints on all planes of motion and able to move against gravity without difficulties   Cervical / Trunk Assessment Cervical / Trunk Assessment: Other exceptions Cervical / Trunk Exceptions: Significant lower back pain   Communication Communication Communication: No difficulties   Cognition Arousal/Alertness: Awake/alert Behavior During Therapy: WFL for tasks assessed/performed Overall Cognitive Status: Impaired/Different from baseline Area of Impairment: Problem solving;Safety/judgement;Memory                     Memory: Decreased short-term memory   Safety/Judgement: Decreased awareness of deficits;Decreased awareness of safety   Problem Solving: Difficulty sequencing;Requires verbal cues     General Comments       Exercises     Shoulder Instructions      Home Living Family/patient expects to be discharged to:: Skilled nursing facility                                 Additional Comments: from [facility] SNF      Prior Functioning/Environment Level of Independence: Needs assistance  Gait / Transfers Assistance Needed: pt reported that he was "bed-bound" at the SNF prior to admission. He stated that they would use a [employer] only to weigh him and that he could not tolerate sitting upright in a chair or w/c ADL's / Homemaking Assistance Needed: required assistance from staff; pt reported that he was able to assist with UB ADLs, grooming            OT Problem List: Decreased strength;Decreased knowledge of use of DME or AE;Decreased activity tolerance;Decreased cognition;Impaired balance (sitting and/or standing);Decreased safety awareness;Pain      OT Treatment/Interventions: Self-care/ADL training;Therapeutic exercise;Patient/family education;Balance training;Energy conservation;Therapeutic activities;DME and/or AE instruction;Cognitive remediation/compensation    OT Goals(Current goals can be found in the care plan section) Acute Rehab OT  Goals Patient Stated Goal: decrease pain and to get to a point where he can perform a stand-pivot transfer and take some steps OT Goal Formulation: With patient Time For Goal Achievement: 07/10/19 Potential to Achieve Goals: Good ADL Goals Pt Will Perform Grooming: with min guard assist;sitting Pt Will Perform Upper Body Bathing: with min assist;with min guard assist;sitting Pt Will Perform Lower Body Bathing: with max assist;with mod assist;sitting/lateral leans Pt Will Perform Upper Body Dressing: with min guard assist;sitting Pt Will Transfer to Toilet: with max assist;with mod assist;with +2 assist;stand pivot transfer;bedside commode Additional ADL Goal #1: Pt will complete bed mobility to sit EOB min A fro simple grooming and ADL tasks  OT Frequency: Min 2X/week   Barriers to D/C:            Co-evaluation PT/OT/SLP Co-Evaluation/Treatment: Yes Reason for Co-Treatment: For patient/therapist safety;To address functional/ADL transfers;Necessary to address cognition/behavior during functional activity PT goals addressed during session: Mobility/safety with mobility;Balance;Strengthening/ROM OT goals addressed during session: ADL's and self-care      AM-PAC OT "6 Clicks" Daily Activity  Outcome Measure Help from another person eating meals?: None Help from another person taking care of personal grooming?: A Little Help from another person toileting, which includes using toliet, bedpan, or urinal?: Total Help from another person bathing (including washing, rinsing, drying)?: Total Help from another person to put on and taking off regular upper body clothing?: A Little Help from another person to put on and taking off regular lower body clothing?: Total 6 Click Score: 13   End of Session    Activity Tolerance: Patient tolerated treatment well;Patient limited by pain Patient left: in bed;with call bell/phone within reach;with bed alarm set  OT Visit Diagnosis: Unsteadiness  on feet (R26.81);Other abnormalities of gait and mobility (R26.89);Muscle weakness (generalized) (M62.81);Other symptoms and signs involving cognitive function;Pain Pain - part of body: (back)                Time: [id number] OT Time Calculation (min): 21 min Charges:  OT General Charges $OT Visit: 1 Visit OT Evaluation $OT Eval Moderate Complexity: 1 Mod    Britt Bottom 06/26/2019, 12:43 PM

## 2019-06-26 NOTE — Evaluation (Addendum)
Physical Therapy Evaluation Patient Details Name: Ivan [name] MRN: [id number] DOB: 05-Sep-1945 Today's Date: 06/26/2019   History of Present Illness  Pt is a 74 y/o male admitted secondary to acute on chronic back pain. MRI revealed lumbar discitis/osteomyelitis and psoas abscess. Of note, pt with recent admissions secondary to [place] related AMS with d/c on 1/27 to [facility] SNF. PMH including but not limited to CAD, HTN, HLD, BPH, recent COVID+, DM.    Clinical Impression  Pt presented supine in bed with HOB elevated, awake and willing to participate in therapy session. Prior to admission, pt reported that he has basically been "bed-bound" at the SNF and required assistance from staff for ADLs. He reported that the staff would use a Hoyer lift to weigh him; however, that he could not tolerate any upright sitting in a chair or w/c secondary to back pain. Pt also stated that he believes he was on the rehab/therapy caseload but uncertain of what they have been able to do with him. At the time of evaluation, pt limited secondary to pain and not wanting to complete any transfers or upright sitting until the medical team has drained his abscess. Pt able to roll bilaterally in bed for pericare with heavy use of bed rails but no physical assistance required. Total A for pericare, although pt appears to have excellent bilateral UE ROM and the ability to perform. Pt would continue to benefit from skilled physical therapy services at this time while admitted and after d/c to address the below listed limitations in order to improve overall safety and independence with functional mobility.   Follow Up Recommendations SNF    Equipment Recommendations  None recommended by PT    Recommendations for Other Services       Precautions / Restrictions Precautions Precautions: Fall Restrictions Weight Bearing Restrictions: No      Mobility  Bed Mobility Overal bed mobility: Needs  Assistance Bed Mobility: Rolling Rolling: Min guard         General bed mobility comments: heavy use of bed rails; pt able to roll bilaterally with cueing; total A for pericare; pt refusing to attempt sitting up at EOB  Transfers                 General transfer comment: pt refusing to attempt until after his abscess is drained  Ambulation/Gait                Stairs            Wheelchair Mobility    Modified Rankin (Stroke Patients Only)       Balance                                             Pertinent Vitals/Pain Pain Assessment: Faces Faces Pain Scale: Hurts even more Pain Location: back Pain Descriptors / Indicators: Grimacing;Guarding Pain Intervention(s): Monitored during session;Repositioned    Home Living Family/patient expects to be discharged to:: Skilled nursing facility                 Additional Comments: from [place]    Prior Function Level of Independence: Needs assistance   Gait / Transfers Assistance Needed: pt reported that he was "bed-bound" at the SNF prior to admission. He stated that they would use a [employer] only to weigh him and that he could not tolerate  sitting upright in a chair or w/c  ADL's / Homemaking Assistance Needed: required assistance from staff; pt reported that he was able to assist with upper body bathing        Hand Dominance   Dominant Hand: Right    Extremity/Trunk Assessment   Upper Extremity Assessment Upper Extremity Assessment: Defer to OT evaluation;Overall WFL for tasks assessed    Lower Extremity Assessment Lower Extremity Assessment: RLE deficits/detail;LLE deficits/detail RLE Deficits / Details: atrophy noted throughout; however, pt with excellent ROM at all joints on all planes of motion and able to move against gravity without difficulties LLE Deficits / Details: atrophy noted throughout; however, pt with excellent ROM at all joints on all planes of  motion and able to move against gravity without difficulties    Cervical / Trunk Assessment Cervical / Trunk Assessment: Other exceptions Cervical / Trunk Exceptions: Significant lower back pain  Communication   Communication: No difficulties  Cognition Arousal/Alertness: Awake/alert Behavior During Therapy: WFL for tasks assessed/performed Overall Cognitive Status: Impaired/Different from baseline Area of Impairment: Problem solving;Safety/judgement;Memory                     Memory: Decreased short-term memory   Safety/Judgement: Decreased awareness of deficits;Decreased awareness of safety   Problem Solving: Difficulty sequencing;Requires verbal cues        General Comments      Exercises     Assessment/Plan    PT Assessment Patient needs continued PT services  PT Problem List Decreased strength;Decreased activity tolerance;Decreased balance;Decreased mobility;Decreased coordination;Decreased cognition;Decreased knowledge of use of DME;Decreased safety awareness;Decreased knowledge of precautions;Pain       PT Treatment Interventions DME instruction;Gait training;Stair training;Functional mobility training;Therapeutic activities;Therapeutic exercise;Balance training;Neuromuscular re-education;Cognitive remediation;Patient/family education    PT Goals (Current goals can be found in the Care Plan section)  Acute Rehab PT Goals Patient Stated Goal: decrease pain and to get to a point where he can perform a stand-pivot transfer and take some steps PT Goal Formulation: With patient Time For Goal Achievement: 07/10/19 Potential to Achieve Goals: Fair    Frequency Min 2X/week   Barriers to discharge        Co-evaluation PT/OT/SLP Co-Evaluation/Treatment: Yes Reason for Co-Treatment: For patient/therapist safety;To address functional/ADL transfers PT goals addressed during session: Mobility/safety with mobility;Balance;Strengthening/ROM         AM-PAC PT  "6 Clicks" Mobility  Outcome Measure Help needed turning from your back to your side while in a flat bed without using bedrails?: A Little Help needed moving from lying on your back to sitting on the side of a flat bed without using bedrails?: A Lot Help needed moving to and from a bed to a chair (including a wheelchair)?: Total Help needed standing up from a chair using your arms (e.g., wheelchair or bedside chair)?: A Lot Help needed to walk in hospital room?: Total Help needed climbing 3-5 steps with a railing? : Total 6 Click Score: 10    End of Session   Activity Tolerance: Patient limited by pain Patient left: in bed;with call bell/phone within reach;with bed alarm set;with SCD's reapplied Nurse Communication: Mobility status PT Visit Diagnosis: Other abnormalities of gait and mobility (R26.89);Pain Pain - part of body: (back)    Time: [id number] PT Time Calculation (min) (ACUTE ONLY): 20 min   Charges:   PT Evaluation $PT Eval Moderate Complexity: 1 Mod          Eduard Clos, PT, DPT  Acute Rehabilitation Services Pager [phone] Office [phone]  Clearnce Sorrel Omair Dettmer 06/26/2019, 12:02 PM

## 2019-06-26 NOTE — ED Notes (Signed)
RN to RN report given to charge on 5N with no questions or concerns.

## 2019-06-26 NOTE — ED Notes (Signed)
Pt transported via cart to 5N10C-01. Pt conscious, breathing, and A&Ox4. No distress noted. All belongings with pt. Pt on monitors.

## 2019-06-27 DIAGNOSIS — N401 Enlarged prostate with lower urinary tract symptoms: Secondary | ICD-10-CM

## 2019-06-27 DIAGNOSIS — M869 Osteomyelitis, unspecified: Secondary | ICD-10-CM

## 2019-06-27 DIAGNOSIS — R3911 Hesitancy of micturition: Secondary | ICD-10-CM

## 2019-06-27 DIAGNOSIS — M4640 Discitis, unspecified, site unspecified: Secondary | ICD-10-CM

## 2019-06-27 LAB — URINE CULTURE: Culture: NO GROWTH

## 2019-06-27 LAB — CBC
HCT: 43.2 % (ref 39.0–52.0)
Hemoglobin: 14.2 g/dL (ref 13.0–17.0)
MCH: 30.8 pg (ref 26.0–34.0)
MCHC: 32.9 g/dL (ref 30.0–36.0)
MCV: 93.7 fL (ref 80.0–100.0)
Platelets: 382 10*3/uL (ref 150–400)
RBC: 4.61 MIL/uL (ref 4.22–5.81)
RDW: 12.9 % (ref 11.5–15.5)
WBC: 10.1 10*3/uL (ref 4.0–10.5)
nRBC: 0 % (ref 0.0–0.2)

## 2019-06-27 LAB — BASIC METABOLIC PANEL
Anion gap: 10 (ref 5–15)
BUN: 16 mg/dL (ref 8–23)
CO2: 25 mmol/L (ref 22–32)
Calcium: 8.8 mg/dL — ABNORMAL LOW (ref 8.9–10.3)
Chloride: 101 mmol/L (ref 98–111)
Creatinine, Ser: 0.68 mg/dL (ref 0.61–1.24)
GFR calc Af Amer: >60 mL/min (ref >60)
GFR calc non Af Amer: >60 mL/min (ref >60)
Glucose, Bld: 143 mg/dL — ABNORMAL HIGH (ref 70–99)
Potassium: 4.6 mmol/L (ref 3.5–5.1)
Sodium: 136 mmol/L (ref 135–145)

## 2019-06-27 NOTE — Plan of Care (Signed)

## 2019-06-27 NOTE — Progress Notes (Signed)
Family Medicine Teaching Service Daily Progress Note Intern Pager: [phone]  Patient name: Ivan [name] Medical record number: [id number] Date of birth: May 08, 1946 Age: 74 y.o. Gender: male  Primary Care Provider: Sherald Hess., MD Consultants: IR, ID Code Status: Full  Pt Overview and Major Events to Date:  2/13 - admitted  Assessment and Plan: Ivan [name] a 74 y.o.malepresenting to the ED at direction of PCP due to increase inflammatory markers and concerning lumbar MRI with possible discitis/osteomyelitis/psoas abscess. PMH is significant forDDD, CAD, HTN, HLD, BPH, recent COVID+, DM, diet controlled.   MRI c/f Lumbar discitis/osteomyelitis and psoas abscess  Hx of DDD  Lumbar MRI(06/22/2019) w/ findings concerning for discitis-osteomyelitis at L1-2 with a possible 1 cm left psoas abscess.Lab work obtained by PCP 2/11/2021showed CRP 23.6, WBC 12.6, platelets 469, sed rate 63. Has not had a fever, Tmax 99 F. Infection disease and interventional radiology have been consulted.  ABx will be held pending biopsy on 2/15.  -Consult to infectious disease, appreciate recommendations -Consult to interventional radiology, appreciate recommendations -Consult to wound care appreciate recommendations -Consult to PT/OT eval and treat -carb modified diet, N.p.o. sips with meds @ [facility] -Follow-up blood cultures:NG24hr -Pain management: -ContinueTylenol 1000mg  q6h -OxyIR 5 mg every 4 hours as needed for breakthrough pain -baclofen 10mg  TID PRNfor muscle spasms  - lidocaine patch q24hr  - mag oxide 400mg  PRN for muscle cramps BPH Dysuria c/f UTI On admission has some dysuria. Urinalysis negative.   Home medications areFinasteride 5mg andTamsulosin 0.8mg . UA wnl.  -continue home medications  Pressure Ulcers Has aknownpressure ulcer on left hip(healing, stage 3-4)with some sloughing open to airw/ no streaking/purulence or  noted cellulitis surrounding.Other pressure ulcer on left heel with a closed wound. There are no dressings or coverings on either of these wounds. Likely due to decrease in mobility over the last month. -Consult to wound care, appreciate recommendations  - left heel: foam dressing, change q3d.   - left hip: BID saline moistened gauze and ABD pads to promote  debridement.   CAD/Hx of MI EKG NSR. No chest pain or shortness of breath currently. Continue home ASA 81mg .   HLD Last lipid panel 02/20/2018 Tot chol 150, LDL 73, HDL 47, triglycerides 107. 05/26/2019 triglycerides 112. Home medications areCrestor 10mg . -Continue home medications  HTN Normotensive since admission. Home medication areAmlodipine 10mg andLisinopril 40mg . -Continue home medications -monitor w/ vital signs per protocol  Hyperkalemia - 5.2 on 2/13. Repeat 4.3  RecentCOVIDinfection Tested positive 05/18/2019. Has since tested negative 06/02/2019. Does not have any respiratory symptoms with this admission but is coming from a skilled nursing facility. covid negative on 2/13.   Depression w/ anxiety Home medicationCymbalta 30mg . Continue home medication.  Diet controlled DM Last A1c 05/20/2019. Glu on admission BMP 154. Glu this morning 101.  -monitor on BMP  FEN/GI:NPO, sips w/ meds,MiraLAX,senna, Mag-Ox Prophylaxis: SCDs  FEN/GI: carb modified. NPO @ [facility] -miralax PRN, senna PRN PPx: heparin q8h  Disposition: Home likely  Subjective:  Patient not complaining of any new pain or discomfort today.  Is curious about timeframe of improvement pending his intervention and antibiotics.  Discussed with patient that this depends on what they find during the biopsy.  Patient states that he is not moving his bowels as regularly and attributes this to laying down.  Still states that he has 3 small semiformed stools a day.  I told the patient we could schedule his MiraLAX, but he states he wants to keep it as needed  for  now.  Does not want to do any other changes to bowel regimen.  Objective: Temp:  [97.7 F (36.5 C)-99 F (37.2 C)] 97.8 F (36.6 C) (02/14 0447) Pulse Rate:  [78-103] 78 (02/14 0447) Resp:  [17-19] 19 (02/14 0447) BP: (110-126)/(67-80) 126/80 (02/14 0447) SpO2:  [97 %-100 %] 100 % (02/14 0447) Physical Exam: General: Alert and oriented.  No acute distress.  Laying in bed. Cardiovascular: Regular rate and rhythm.  No murmurs.  2+ radial pulse bilaterally.  No pitting edema Respiratory: Lungs clear to auscultation bilaterally.  No wheezing or crackles. Abdomen: Soft, nontender.  Normal bowel sounds. Neuro: Cranial nerves II through XII grossly intact.  Sensation intact in the face, upper and lower extremities bilaterally.  Strength 5/5 upper extremities, 4/5 bilaterally in the hip flexors.  No patellar tendon reflex bilaterally.  Normal brachial radialis reflex bilaterally.  Laboratory: Recent Labs  Lab 06/24/19 0927 06/25/19 2102 06/26/19 0259  WBC 12.6* 9.5 8.8  HGB 15.0 14.0 13.8  HCT 42.6 43.3 42.5  PLT 469* 365 350   Recent Labs  Lab 06/25/19 2102 06/26/19 0259 06/26/19 1114  NA 135 138  --   K 4.3 5.2* 4.1  CL 101 103  --   CO2 24 26  --   BUN 20 20  --   CREATININE 0.60* 0.62  --   CALCIUM 8.9 9.1  --   GLUCOSE 154* 101*  --      Imaging/Diagnostic Tests:   [name], MD 06/27/2019, 7:33 AM PGY-2, [place] Intern pager: [phone], text pages welcome

## 2019-06-27 NOTE — Plan of Care (Signed)
  Problem: Pain Managment: Goal: General experience of comfort will improve Outcome: Progressing   Problem: Safety: Goal: Ability to remain free from injury will improve Outcome: Progressing   Problem: Skin Integrity: Goal: Risk for impaired skin integrity will decrease Outcome: Progressing   

## 2019-06-28 ENCOUNTER — Inpatient Hospital Stay (HOSPITAL_COMMUNITY): Payer: Medicare HMO

## 2019-06-28 HISTORY — PX: IR LUMBAR DISC ASPIRATION W/IMG GUIDE: IMG5306

## 2019-06-28 LAB — CBC
HCT: 40.7 % (ref 39.0–52.0)
Hemoglobin: 13.5 g/dL (ref 13.0–17.0)
MCH: 30.8 pg (ref 26.0–34.0)
MCHC: 33.2 g/dL (ref 30.0–36.0)
MCV: 92.7 fL (ref 80.0–100.0)
Platelets: 380 10*3/uL (ref 150–400)
RBC: 4.39 MIL/uL (ref 4.22–5.81)
RDW: 12.8 % (ref 11.5–15.5)
WBC: 8.6 10*3/uL (ref 4.0–10.5)
nRBC: 0 % (ref 0.0–0.2)

## 2019-06-28 LAB — BASIC METABOLIC PANEL WITH GFR
Anion gap: 10 (ref 5–15)
BUN: 19 mg/dL (ref 8–23)
CO2: 27 mmol/L (ref 22–32)
Calcium: 9 mg/dL (ref 8.9–10.3)
Chloride: 101 mmol/L (ref 98–111)
Creatinine, Ser: 0.61 mg/dL (ref 0.61–1.24)
GFR calc Af Amer: >60 mL/min
GFR calc non Af Amer: >60 mL/min
Glucose, Bld: 104 mg/dL — ABNORMAL HIGH (ref 70–99)
Potassium: 4.5 mmol/L (ref 3.5–5.1)
Sodium: 138 mmol/L (ref 135–145)

## 2019-06-28 LAB — PROTIME-INR
INR: 1 (ref 0.8–1.2)
Prothrombin Time: 12.7 seconds (ref 11.4–15.2)

## 2019-06-28 MED ORDER — FENTANYL CITRATE (PF) 100 MCG/2ML IJ SOLN
INTRAMUSCULAR | Status: AC
  Filled 2019-06-28: qty 2

## 2019-06-28 MED ORDER — MIDAZOLAM HCL 2 MG/2ML IJ SOLN
INTRAMUSCULAR | Status: AC
  Filled 2019-06-28: qty 2

## 2019-06-28 MED ORDER — MIDAZOLAM HCL 2 MG/2ML IJ SOLN
INTRAMUSCULAR | Status: AC | PRN
  Administered 2019-06-28: 1 mg via INTRAVENOUS

## 2019-06-28 MED ORDER — SODIUM CHLORIDE 0.9 % IV SOLN
2.0000 g | INTRAVENOUS | Status: DC
  Administered 2019-06-28 – 2019-07-02 (×5): 2 g via INTRAVENOUS
  Filled 2019-06-28 (×5): qty 20

## 2019-06-28 MED ORDER — ADULT MULTIVITAMIN W/MINERALS CH
1.0000 | ORAL_TABLET | Freq: Every day | ORAL | Status: DC
  Administered 2019-06-29 – 2019-07-04 (×6): 1 via ORAL
  Filled 2019-06-28 (×6): qty 1

## 2019-06-28 MED ORDER — SODIUM CHLORIDE 0.9 % IV SOLN: INTRAVENOUS | Status: AC

## 2019-06-28 MED ORDER — FENTANYL CITRATE (PF) 100 MCG/2ML IJ SOLN
INTRAMUSCULAR | Status: AC | PRN
  Administered 2019-06-28 (×3): 25 ug via INTRAVENOUS

## 2019-06-28 MED ORDER — BUPIVACAINE HCL (PF) 0.5 % IJ SOLN
INTRAMUSCULAR | Status: AC
  Filled 2019-06-28: qty 30

## 2019-06-28 MED ORDER — ENSURE ENLIVE PO LIQD
237.0000 mL | Freq: Three times a day (TID) | ORAL | Status: DC
  Administered 2019-06-29 – 2019-07-04 (×14): 237 mL via ORAL

## 2019-06-28 MED ORDER — VANCOMYCIN HCL IN DEXTROSE 1-5 GM/200ML-% IV SOLN
1000.0000 mg | Freq: Two times a day (BID) | INTRAVENOUS | Status: DC
  Administered 2019-06-29 – 2019-07-04 (×11): 1000 mg via INTRAVENOUS
  Filled 2019-06-28 (×12): qty 200

## 2019-06-28 MED ORDER — VANCOMYCIN HCL 1750 MG/350ML IV SOLN
1750.0000 mg | Freq: Once | INTRAVENOUS | Status: AC
  Administered 2019-06-28: 1750 mg via INTRAVENOUS
  Filled 2019-06-28: qty 350

## 2019-06-28 NOTE — Consult Note (Signed)
Referring Physician(s): Dr Sherri Sear  Supervising Physician: Luanne Bras  Patient Status:  [facility] - In-pt  Chief Complaint:  Back pain Incontinence   Subjective:  Pt had sudden onset back pain Jan 1,2021 Was fine night before-- awoke in severe pain Denies injury Has only worsened for weeks  Was initially admitted with Covid infection 05/21/19 Dcd only to come back with worsening back pain  MRI 2/9; IMPRESSION: 1. Findings concerning for discitis-osteomyelitis at L1-2 with a possible 1 cm left psoas abscess. No evidence of epidural abscess. 2. Less pronounced fluid signal in the T10-11, L2-3, and L5-S1 discs without convincing evidence of osteomyelitis at these levels. 3. Increased, mild spinal stenosis at L1-2. 4. Up to mild spinal and severe neural foraminal stenosis at other levels as above.  Has been seen by ID Request made for aspiration of L1-2 discitis Dr Estanislado Pandy has reviewed imaging and approves procedure  Allergies: Contrast media [iodinated diagnostic agents]  Medications: Prior to Admission medications   Medication Sig Start Date End Date Taking? Authorizing Provider  acetaminophen (TYLENOL) 500 MG tablet Take 1,000 mg by mouth See admin instructions. Take 2 tablets (1000 mg) by mouth twice daily, may also take 2 tablets (1000 mg) every 6 hours as needed for pain   Yes [provider]  Amino Acids-Protein Hydrolys (FEEDING SUPPLEMENT, PRO-STAT SUGAR FREE 64,) LIQD Take 60 mLs by mouth daily.   Yes [provider]  amLODipine (NORVASC) 10 MG tablet Take 1 tablet (10 mg total) by mouth daily. 06/05/19  Yes Stark Klein, MD  aspirin EC 81 MG EC tablet Take 1 tablet (81 mg total) by mouth daily. 10/14/14  Yes Almyra Deforest, PA  baclofen (LIORESAL) 10 MG tablet Take 0.5-1 tablets (5-10 mg total) by mouth 3 (three) times daily as needed for muscle spasms. Patient taking differently: Take 10 mg by mouth 3 (three) times daily as needed for  muscle spasms.  06/11/19  Yes Hilts, Legrand Como, MD  DULoxetine (CYMBALTA) 30 MG capsule Take 1 capsule (30 mg total) by mouth daily. 06/05/19  Yes Stark Klein, MD  finasteride (PROSCAR) 5 MG tablet Take 1 tablet (5 mg total) by mouth daily. 08/28/18  Yes Lauree Chandler, NP  gabapentin (NEURONTIN) 100 MG capsule Take 200 mg by mouth at bedtime.   Yes [provider]  Lidocaine 4 % PTCH Place 1 patch onto the skin daily. Remove nightly at bedtime   Yes [provider]  lisinopril (ZESTRIL) 40 MG tablet TAKE 1 TABLET BY MOUTH EVERY DAY Patient taking differently: Take 40 mg by mouth daily.  01/21/19  Yes Lauree Chandler, NP  magnesium oxide (MAG-OX) 400 MG tablet Take 400 mg by mouth daily as needed (muscle cramps).   Yes [provider]  naproxen (NAPROSYN) 250 MG tablet Take 1 tablet (250 mg total) by mouth every 8 (eight) hours as needed for moderate pain. 06/09/19  Yes Bonnita Hollow, MD  rosuvastatin (CRESTOR) 10 MG tablet Take 1 tablet (10 mg total) by mouth daily. 01/29/19 01/29/20 Yes Weaver, Scott T, PA-C  tamsulosin (FLOMAX) 0.4 MG CAPS capsule Take 2 capsules (0.8 mg total) by mouth daily. 06/05/19  Yes Stark Klein, MD  traMADol (ULTRAM) 50 MG tablet TAKE 1 OR 2 TABLETS EVERY 6 HOURS AS NEEDED NFOR PAIN Patient taking differently: Take 50-100 mg by mouth every 6 (six) hours as needed (1 tablet - mild to moderate pain/ 2 tablets - severe pain).  11/02/18  Yes Lauree Chandler,  NP  diclofenac Sodium (VOLTAREN) 1 % GEL Apply 2 g topically 4 (four) times daily. Patient not taking: Reported on 06/25/2019 06/04/19   Stark Klein, MD  lidocaine (LIDODERM) 5 % Place 1 patch onto the skin daily. Remove & Discard patch within 12 hours or as directed by MD Patient not taking: Reported on 06/25/2019 06/04/19   Stark Klein, MD  tadalafil (CIALIS) 5 MG tablet TAKE ONE TABLET BY MOUTH DAILY AS NEEDED FOR ERECTILE DYSFUNCTION Patient not taking: No sig reported  02/24/19   Lauree Chandler, NP     Vital Signs: BP (!) 115/52 (BP Location: Right Arm)   Pulse (!) 45   Temp (!) 97.4 F (36.3 C) (Oral)   Resp 16   Ht 5\' 10"  (1.778 m)   Wt 174 lb 9.7 oz (79.2 kg)   SpO2 94%   BMI 25.05 kg/m   Physical Exam Vitals reviewed.  Cardiovascular:     Rate and Rhythm: Normal rate and regular rhythm.     Heart sounds: Normal heart sounds.  Pulmonary:     Effort: Pulmonary effort is normal.     Breath sounds: Normal breath sounds.  Abdominal:     Palpations: Abdomen is soft.  Musculoskeletal:     Comments: Difficult moving in bed Cannot stand  Can move legs but unable to really use them much  Skin:    General: Skin is warm and dry.  Neurological:     Mental Status: He is alert and oriented to person, place, and time.  Psychiatric:        Behavior: Behavior normal.        Judgment: Judgment normal.     Imaging: No results found.  Labs:  CBC: Recent Labs    06/24/19 0927 06/25/19 2102 06/26/19 0259 06/27/19 1120  WBC 12.6* 9.5 8.8 10.1  HGB 15.0 14.0 13.8 14.2  HCT 42.6 43.3 42.5 43.2  PLT 469* 365 350 382    COAGS: Recent Labs    05/18/19 1159  INR 1.1  APTT 38*    BMP: Recent Labs    06/06/19 0320 06/06/19 0320 06/25/19 2102 06/26/19 0259 06/26/19 1114 06/27/19 1120  NA 137  --  135 138  --  136  K 4.5   < > 4.3 5.2* 4.1 4.6  CL 99  --  101 103  --  101  CO2 27  --  24 26  --  25  GLUCOSE 104*  --  154* 101*  --  143*  BUN 13  --  20 20  --  16  CALCIUM 8.9  --  8.9 9.1  --  8.8*  CREATININE 0.59*  --  0.60* 0.62  --  0.68  GFRNONAA >60  --  >60 >60  --  >60  GFRAA >60  --  >60 >60  --  >60   < > = values in this interval not displayed.    LIVER FUNCTION TESTS: Recent Labs    05/26/19 1553 05/27/19 0445 05/28/19 0309 05/29/19 1047  BILITOT 1.1 1.3* 1.1 1.0  AST 112* 78* 48* 27  ALT 196* 153* 116* 74*  ALKPHOS 70 61 55 47  PROT 6.4* 5.8* 5.6* 5.6*  ALBUMIN 2.7* 2.4* 2.3* 2.3*     Assessment and Plan:  Severe back pain Discitis/osteomyelitis Lumbar 1-2 area Scheduled for disc aspiration in IR Risks and benefits of L1-2 disc aspiration was discussed with the patient and/or patient's family including, but not limited to bleeding, infection,  damage to adjacent structures or low yield requiring additional tests.  All of the questions were answered and there is agreement to proceed. Consent signed and in chart.   Electronically Signed: Lavonia Drafts, PA-C 06/28/2019, 8:54 AM   I spent a total of 25 Minutes at the the patient's bedside AND on the patient's hospital floor or unit, greater than 50% of which was counseling/coordinating care for L1 -2 disc aspiration

## 2019-06-28 NOTE — Procedures (Signed)
S/P L1-L2 flouro guided disc aspiration x 2 passes . Approx 2cc of  thick aspirate obtained. S.Etter Royall MD

## 2019-06-28 NOTE — Progress Notes (Addendum)
Patient c/o of difficulty urinating while in bed, asked for a foley to be placed.  Patient can urinate if he is standing or sitting up but stated he cannot do either d/t pain.  MD w/Family notified   1110  Patient has been able to void.

## 2019-06-28 NOTE — Progress Notes (Signed)
Initial Nutrition Assessment  RD working remotely.  DOCUMENTATION CODES:   Not applicable  INTERVENTION:   -Double protein portions with meals -Ensure Enlive po TID, each supplement provides 350 kcal and 20 grams of protein -MVI with minerals daily  NUTRITION DIAGNOSIS:   Increased nutrient needs related to wound healing as evidenced by estimated needs.  GOAL:   Patient will meet greater than or equal to 90% of their needs  MONITOR:   PO intake, Supplement acceptance, Diet advancement, Labs, Weight trends, Skin, I & O's  REASON FOR ASSESSMENT:   Consult Assessment of nutrition requirement/status  ASSESSMENT:   Ivan [name] is a 74 y.o. male presenting to the ED at direction of PCP due to increase inflammatory markers and concerning lumbar MRI with possible discitis/osteomyelitis/psoas abscess. PMH is significant for DDD, CAD, HTN, HLD, BPH, recent COVID+, DM, diet controlled  Pt admitted with lumbar diskitis and osteomyelitis with left psoas muscle abscess.   Reviewed I/O's: -660 ml x 24 hours and -1.7 L x 24 hours  UOP: 500 ml x 24 hours  Per [facility] note on 06/26/19, pt with DPTI on lt heel and unstageable pressure injury on lt hip.   Pt currently NPO for disc aspiration with IR today. Previously, pt on a carb modified diet, consuming 100% of meals.   RD attempted to speak with pt via phone, however, no answer. RD unable to obtain further nutrition-related history at this time.   Reviewed wt hx; noted pt has experienced a 16.5% wt loss over the past month, which is significant for time frame. Given weight loss, increased nutritional needs, and presence of pressure injuries, highly suspect pt with malnutrition, however, RD unable to assess at this time. Pt would greatly benefit from addition of oral nutrition supplements.   Pt with increased nutritional needs and would benefit from nutrient dense supplement. One Ensure Enlive supplement provides 350 kcals, 20 grams  protein, and 44-45 grams of carbohydrate vs one Glucerna shake supplement, which provides 220 kcals, 10 grams of protein, and 26 grams of carbohydrate. Given pt's hx of DM, RD will continue to monitor PO intake, CBGS, and adjust supplement regimen as appropriate.   Labs reviewed.   Diet Order:   Diet Order            Diet NPO time specified Except for: Sips with Meds  Diet effective midnight              EDUCATION NEEDS:   No education needs have been identified at this time  Skin:  Skin Assessment: Skin Integrity Issues: Skin Integrity Issues:: DTI, Unstageable DTI: lt heel Unstageable: lt hip  Last BM:  06/28/19  Height:   Ht Readings from Last 1 Encounters:  06/25/19 5\' 10"  (1.778 m)    Weight:   Wt Readings from Last 1 Encounters:  06/26/19 79.2 kg    Ideal Body Weight:  75.5 kg  BMI:  Body mass index is 25.05 kg/m.  Estimated Nutritional Needs:   Kcal:  2400-2600  Protein:  125-150 grams  Fluid:  > 2.4 L    Loistine Chance, RD, LDN, [place] Registered Dietitian II Certified Diabetes Care and Education Specialist Please refer to [facility] for RD and/or RD on-call/weekend/after hours pager

## 2019-06-28 NOTE — Progress Notes (Addendum)
Family Medicine Teaching Service Daily Progress Note Intern Pager: [phone]  Patient name: Ivan [name] Medical record number: [id number] Date of birth: 11/26/1945 Age: 74 y.o. Gender: male  Primary Care Provider: Sherald [name]., MD Consultants: IR, ID Code Status: Full  Pt Overview and Major Events to Date:  2/13 - admitted  Assessment and Plan: Ivan Zamor Newsomis a 74 y.o.malepresenting to the ED at direction of PCP due to increase inflammatory markers and concerning lumbar MRI with possible discitis/osteomyelitis/psoas abscess. PMH is significant forDDD, CAD, HTN, HLD, BPH, recent COVID+, DM, diet controlled.   MRI c/f Lumbar discitis/osteomyelitis and psoas abscess  Hx of DDD  Continues to have intermittent back discomfort. Lumbar MRI(06/22/2019) w/ findings concerning for discitis-osteomyelitis at L1-2 with a possible 1 cm left psoas abscess.Lab work obtained by PCP 2/11/2021showed CRP 23.6, WBC 12.6, platelets 469, sed rate 63. Has not had a fever, Tmax 99 F. Infection disease and interventional radiology have been consulted.  ABx will be held pending biopsy on 2/15.  -Consult to infectious disease, appreciate recommendations -Consult to interventional radiology, appreciate recommendations -Consult to wound care appreciate recommendations -Consult to PT/OT eval and treat -carb modified diet, N.p.o. sips with meds @ [facility] -Follow-up blood cultures: NG24hr -Pain management: -ContinueTylenol 1000mg  q6h -OxyIR 5 mg every 4 hours as needed for breakthrough pain -baclofen 10mg  TID PRNfor muscle spasms  - lidocaine patch q24hr  - mag oxide 400mg  PRN for muscle cramps  BPH Dysuria c/f UTI. Stable.  Currently having issues attempting to urinate while laying down. Would like to have a foley. On admission has some dysuria but has since resolved. Urinalysis negative.   Home medications areFinasteride 5mg andTamsulosin 0.8mg . UA  wnl.  -continue home medications -start Foley catheter   Pressure Ulcers Has aknownpressure ulcer on left hip(healing, stage 3-4)with some sloughing open to airw/ no streaking/purulence or noted cellulitis surrounding.Other pressure ulcer on left heel with a closed wound. There are no dressings or coverings on either of these wounds. Likely due to decrease in mobility over the last month. -Consult to wound care, appreciate recommendations  - left heel: foam dressing, change q3d.   - left hip: BID saline moistened gauze and ABD pads to promote  debridement.   CAD/Hx of MI EKG NSR. No chest pain or shortness of breath currently. Continue home ASA 81mg .   HLD Last lipid panel 02/20/2018 Tot chol 150, LDL 73, HDL 47, triglycerides 107. 05/26/2019 triglycerides 112. Home medications areCrestor 10mg . -Continue home medications  HTN Normotensive since admission. Home medication areAmlodipine 10mg andLisinopril 40mg . -Continue home medications -monitor w/ vital signs per protocol  Hyperkalemia. Resolved. - 5.2 on 2/13. Repeat 4.3>4.6>4.5  RecentCOVIDinfection Tested positive 05/18/2019. Has since tested negative 06/02/2019. Does not have any respiratory symptoms with this admission but is coming from a skilled nursing facility. covid negative on 2/13.   Depression w/ anxiety Home medicationCymbalta 30mg . Continue home medication.  Diet controlled DM Last A1c 05/20/2019. Glu on admission BMP 154. Glu this morning 143.  -monitor on BMP   FEN/GI: carb modified. NPO @ MN; Diet pending procedure -miralax PRN, senna PRN PPx: Lovenox pending procedure  Disposition: Home pending medical clearance  Subjective:  No acute events overnight. No concerns at this time.   Objective: Temp:  [97.9 F (36.6 C)-98.2 F (36.8 C)] 97.9 F (36.6 C) (02/15 0412) Pulse Rate:  [61-96] 61 (02/15 0412) Resp:  [15-20] 16 (02/15 0412) BP: (105-120)/(64-72) 105/66 (02/15 0412) SpO2:  [96  %-98 %] 96 % (02/15 0412)  Physical Exam: General: Appears well, no acute distress. Age appropriate. Cardiac: RRR, normal heart sounds, no murmurs Respiratory: CTAB, normal effort Extremities: No edema or cyanosis. Skin: Warm and dry, no rashes noted. Right hip and left heel pressure ulcers with clean dry dressings covering them.  Neuro: alert and oriented, Cranial nerves II through XII grossly intact.  Sensation intact in the face, upper and lower extremities bilaterally.  Strength 5/5 upper extremities, 4/5 bilaterally in the hip flexors.   Neuro:Laboratory: Recent Labs  Lab 06/25/19 2102 06/26/19 0259 06/27/19 1120  WBC 9.5 8.8 10.1  HGB 14.0 13.8 14.2  HCT 43.3 42.5 43.2  PLT 365 350 382   Recent Labs  Lab 06/25/19 2102 06/25/19 2102 06/26/19 0259 06/26/19 1114 06/27/19 1120  NA 135  --  138  --  136  K 4.3   < > 5.2* 4.1 4.6  CL 101  --  103  --  101  CO2 24  --  26  --  25  BUN 20  --  20  --  16  CREATININE 0.60*  --  0.62  --  0.68  CALCIUM 8.9  --  9.1  --  8.8*  GLUCOSE 154*  --  101*  --  143*   < > = values in this interval not displayed.     Imaging/Diagnostic Tests: No new imaging.  Gerlene Fee, DO 06/28/2019, 9:30 AM PGY-1, [place] Intern pager: [phone], text pages welcome

## 2019-06-28 NOTE — Progress Notes (Signed)
Subjective: No new complaints   Antibiotics:  Anti-infectives (From admission, onward)   Start     Dose/Rate Route Frequency Ordered Stop   06/26/19 1500  cefTRIAXone (ROCEPHIN) 1 g in sodium chloride 0.9 % 100 mL IVPB  Status:  Discontinued     1 g 200 mL/hr over 30 Minutes Intravenous Every 24 hours 06/26/19 1452 06/26/19 1455      Medications: Scheduled Meds: . acetaminophen  1,000 mg Oral Q6H  . amLODipine  10 mg Oral Daily  . aspirin EC  81 mg Oral Daily  . DULoxetine  30 mg Oral Daily  . finasteride  5 mg Oral Daily  . lidocaine  1 patch Transdermal Q24H  . rosuvastatin  10 mg Oral Daily  . tamsulosin  0.8 mg Oral Daily   Continuous Infusions: PRN Meds:.baclofen, magnesium oxide, oxyCODONE, polyethylene glycol, senna    Objective: Weight change:   Intake/Output Summary (Last 24 hours) at 06/28/2019 1106 Last data filed at 06/28/2019 0400 Gross per 24 hour  Intake --  Output 300 ml  Net -300 ml   Blood pressure (!) 115/52, pulse (!) 45, temperature (!) 97.4 F (36.3 C), temperature source Oral, resp. rate 16, height 5\' 10"  (1.778 m), weight 79.2 kg, SpO2 94 %. Temp:  [97.4 F (36.3 C)-98.2 F (36.8 C)] 97.4 F (36.3 C) (02/15 0748) Pulse Rate:  [45-96] 45 (02/15 0748) Resp:  [15-18] 16 (02/15 0748) BP: (105-120)/(52-71) 115/52 (02/15 0748) SpO2:  [94 %-98 %] 94 % (02/15 0748)  Physical Exam: General: Alert and awake, oriented x3, not in any acute distress. HEENT: anicteric sclera, EOMI CVS regular rate, normal  Chest: , no wheezing, no respiratory distress Abdomen: soft non-distended,  Extremities: deep wound on hip with bandage, wound to left foot Neuro: nonfocal  CBC:    BMET Recent Labs    06/27/19 1120 06/28/19 0931  NA 136 138  K 4.6 4.5  CL 101 101  CO2 25 27  GLUCOSE 143* 104*  BUN 16 19  CREATININE 0.68 0.61  CALCIUM 8.8* 9.0     Liver Panel  No results for input(s): PROT, ALBUMIN, AST, ALT, ALKPHOS, BILITOT,  BILIDIR, IBILI in the last 72 hours.     Sedimentation Rate Recent Labs    06/25/19 2102  ESRSEDRATE 53*   C-Reactive Protein No results for input(s): CRP in the last 72 hours.  Micro Results: Recent Results (from the past 720 hour(s))  SARS CORONAVIRUS 2 (TAT 6-24 HRS) Nasopharyngeal Nasopharyngeal Swab     Status: None   Collection Time: 05/31/19  3:18 PM   Specimen: Nasopharyngeal Swab  Result Value Ref Range Status   SARS Coronavirus 2 NEGATIVE NEGATIVE Final    Comment: (NOTE) SARS-CoV-2 target nucleic acids are NOT DETECTED. The SARS-CoV-2 RNA is generally detectable in upper and lower respiratory specimens during the acute phase of infection. Negative results do not preclude SARS-CoV-2 infection, do not rule out co-infections with other pathogens, and should not be used as the sole basis for treatment or other patient management decisions. Negative results must be combined with clinical observations, patient history, and epidemiological information. The expected result is Negative. Fact Sheet for Patients: SugarRoll.be Fact Sheet for Healthcare Providers: https://www.woods-mathews.com/ This test is not yet approved or cleared by the [place] FDA and  has been authorized for detection and/or diagnosis of SARS-CoV-2 by FDA under an Emergency Use Authorization (EUA). This EUA will remain  in effect (meaning this test can  be used) for the duration of the COVID-19 declaration under Section 56 4(b)(1) of the Act, 21 U.S.C. section 360bbb-3(b)(1), unless the authorization is terminated or revoked sooner. Performed at [facility], [place] [address] [place], [place] [address]   SARS CORONAVIRUS 2 (TAT 6-24 HRS) Nasopharyngeal Nasopharyngeal Swab     Status: None   Collection Time: 06/02/19 10:18 AM   Specimen: Nasopharyngeal Swab  Result Value Ref Range Status   SARS Coronavirus 2 NEGATIVE NEGATIVE Final    Comment:  (NOTE) SARS-CoV-2 target nucleic acids are NOT DETECTED. The SARS-CoV-2 RNA is generally detectable in upper and lower respiratory specimens during the acute phase of infection. Negative results do not preclude SARS-CoV-2 infection, do not rule out co-infections with other pathogens, and should not be used as the sole basis for treatment or other patient management decisions. Negative results must be combined with clinical observations, patient history, and epidemiological information. The expected result is Negative. Fact Sheet for Patients: SugarRoll.be Fact Sheet for Healthcare Providers: https://www.woods-mathews.com/ This test is not yet approved or cleared by the [place] FDA and  has been authorized for detection and/or diagnosis of SARS-CoV-2 by FDA under an Emergency Use Authorization (EUA). This EUA will remain  in effect (meaning this test can be used) for the duration of the COVID-19 declaration under Section 56 4(b)(1) of the Act, 21 U.S.C. section 360bbb-3(b)(1), unless the authorization is terminated or revoked sooner. Performed at [facility], [place] [address], [place], [place] [address]   Blood culture (routine x 2)     Status: None (Preliminary result)   Collection Time: 06/25/19  8:55 PM   Specimen: BLOOD RIGHT FOREARM  Result Value Ref Range Status   Specimen Description BLOOD RIGHT FOREARM  Final   Special Requests   Final    BOTTLES DRAWN AEROBIC AND ANAEROBIC Blood Culture adequate volume   Culture   Final    NO GROWTH 3 DAYS Performed at [facility], [place] [address], [place], [place] [address]    Report Status PENDING  Incomplete  Blood culture (routine x 2)     Status: None (Preliminary result)   Collection Time: 06/25/19  9:02 PM   Specimen: BLOOD RIGHT FOREARM  Result Value Ref Range Status   Specimen Description BLOOD RIGHT FOREARM  Final   Special Requests   Final    BOTTLES DRAWN AEROBIC  AND ANAEROBIC Blood Culture results may not be optimal due to an excessive volume of blood received in culture bottles   Culture   Final    NO GROWTH 3 DAYS Performed at [facility], [place] [address], [place], [place] [address]    Report Status PENDING  Incomplete  Urine Culture     Status: None   Collection Time: 06/25/19 10:50 PM   Specimen: Urine, Clean Catch  Result Value Ref Range Status   Specimen Description URINE, CLEAN CATCH  Final   Special Requests NONE  Final   Culture   Final    NO GROWTH Performed at [facility], [place] [address], [place], [place] [address]    Report Status 06/27/2019 FINAL  Final  SARS CORONAVIRUS 2 (TAT 6-24 HRS) Nasopharyngeal Nasopharyngeal Swab     Status: None   Collection Time: 06/25/19 10:55 PM   Specimen: Nasopharyngeal Swab  Result Value Ref Range Status   SARS Coronavirus 2 NEGATIVE NEGATIVE Final    Comment: (NOTE) SARS-CoV-2 target nucleic acids are NOT DETECTED. The SARS-CoV-2  RNA is generally detectable in upper and lower respiratory specimens during the acute phase of infection. Negative results do not preclude SARS-CoV-2 infection, do not rule out co-infections with other pathogens, and should not be used as the sole basis for treatment or other patient management decisions. Negative results must be combined with clinical observations, patient history, and epidemiological information. The expected result is Negative. Fact Sheet for Patients: SugarRoll.be Fact Sheet for Healthcare Providers: https://www.woods-mathews.com/ This test is not yet approved or cleared by the [place] FDA and  has been authorized for detection and/or diagnosis of SARS-CoV-2 by FDA under an Emergency Use Authorization (EUA). This EUA will remain  in effect (meaning this test can be used) for the duration of the COVID-19 declaration under Section 56 4(b)(1) of the Act, 21 U.S.C. section  360bbb-3(b)(1), unless the authorization is terminated or revoked sooner. Performed at [facility], [place] [address], [place], [place] [address]     Studies/Results: No results found.    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  He was eventually admitted to the hospital on 05/18/2019 and found to have Covid infection.  Admission notes also indicate that they thought he had new abdominal pain.  He was treated for community-acquired pneumonia and a UTI in addition to receiving 4 days of remdesivir and steroids before leaving [place].  He continued to have delirium and severe back pain after discharge and laid on the floor for 4 days straight prior to being readmitted on 05/26/2019.  New left hip pressure wound.  He was treated empirically for possible sepsis and received 3 days of vancomycin, cefepime and metronidazole.  He was discharged on 06/09/2019.  Has not been able to walk more than 10 steps since he first got sick because of severe pain.  On 06/22/2019 which showed evidence of discitis and osteomyelitis at the L1-2 level with a 1 cm left psoas abscess.  He also had some early, increased signal at T10-11, L2-3 and L5-S  #1 Lumbar diskitis and osteomyelitis with left psoas muscle abscess  --IR to aspirate today. --Start antibiotics after aspirate is obtained  #2 hip deep pressure wound: Wound care have recommended surgical debridement I will also get an MRI of his hip to ensure no deep  bone infection at this site.     LOS: 3 days   Alcide Evener 06/28/2019, 11:06 AM

## 2019-06-28 NOTE — Progress Notes (Signed)
Pharmacy Antibiotic Note  Ivan [name] is a 74 y.o. male admitted on  06/25/2019 with increased inflammatory markers and lumbar MRI concerning for possible discitis/osteomyelitis/psoas abscess. Patient complains of intermittent back discomfort, and remains afebrile (Tmas 99), WBC 8.6, with no growth to date on blood or urine cultures. Disc biopsy obtained 2/15, cultures pending. Pharmacy has been consulted for vancomycin dosing for osteomyelitis; also started on ceftriaxone.   Plan: Vancomycin 1750 mg IV load, then 1000 mg IV q12h.  Goal AUC 400-550 Calculated AUC 455.6 Scr 0.8 (CrCl 87) Monitor clinical progress, renal function, cultures and sensitivities, and vancomycin levels as clinically indicated.   Height: 5\' 10"  (177.8 cm) Weight: 174 lb 9.7 oz (79.2 kg) IBW/kg (Calculated) : 73  Temp (24hrs), Avg:97.8 F (36.6 C), Min:97.4 F (36.3 C), Max:98.1 F (36.7 C)  Recent Labs  Lab 06/24/19 0927 06/25/19 2102 06/26/19 0259 06/27/19 1120 06/28/19 0931  WBC 12.6* 9.5 8.8 10.1 8.6  CREATININE  --  0.60* 0.62 0.68 0.61    Estimated Creatinine Clearance: 87.4 mL/min (by C-G formula based on SCr of 0.61 mg/dL).    Allergies  Allergen Reactions  . Contrast Media [Iodinated Diagnostic Agents] Other (See Comments)    Vascular headache    Antimicrobials this admission: Ceftriaxone 2/15 >>  Vancomycin 2/15 >>  Dose adjustments this admission:   Microbiology results: 2/12 BCx: negative 2/12 UCx: negative 2/15 Disc Aspiration: pending    Thank you for allowing pharmacy to be a part of this patient's care.  Claudina Lick, PharmD Candidate  06/28/2019 3:26 PM

## 2019-06-29 ENCOUNTER — Inpatient Hospital Stay: Payer: Self-pay

## 2019-06-29 ENCOUNTER — Inpatient Hospital Stay (HOSPITAL_COMMUNITY): Payer: Medicare HMO

## 2019-06-29 DIAGNOSIS — M7061 Trochanteric bursitis, right hip: Secondary | ICD-10-CM

## 2019-06-29 LAB — BASIC METABOLIC PANEL
Anion gap: 10 (ref 5–15)
BUN: 17 mg/dL (ref 8–23)
CO2: 26 mmol/L (ref 22–32)
Calcium: 8.8 mg/dL — ABNORMAL LOW (ref 8.9–10.3)
Chloride: 99 mmol/L (ref 98–111)
Creatinine, Ser: 0.6 mg/dL — ABNORMAL LOW (ref 0.61–1.24)
GFR calc Af Amer: >60 mL/min (ref >60)
GFR calc non Af Amer: >60 mL/min (ref >60)
Glucose, Bld: 115 mg/dL — ABNORMAL HIGH (ref 70–99)
Potassium: 3.8 mmol/L (ref 3.5–5.1)
Sodium: 135 mmol/L (ref 135–145)

## 2019-06-29 LAB — CBC
HCT: 39.7 % (ref 39.0–52.0)
Hemoglobin: 13.2 g/dL (ref 13.0–17.0)
MCH: 31.3 pg (ref 26.0–34.0)
MCHC: 33.2 g/dL (ref 30.0–36.0)
MCV: 94.1 fL (ref 80.0–100.0)
Platelets: 369 10*3/uL (ref 150–400)
RBC: 4.22 MIL/uL (ref 4.22–5.81)
RDW: 13.1 % (ref 11.5–15.5)
WBC: 9.4 10*3/uL (ref 4.0–10.5)
nRBC: 0 % (ref 0.0–0.2)

## 2019-06-29 LAB — HEPATITIS C ANTIBODY: HCV Ab: NONREACTIVE

## 2019-06-29 LAB — HEPATITIS B SURFACE ANTIGEN: Hepatitis B Surface Ag: NONREACTIVE

## 2019-06-29 LAB — HIV ANTIBODY (ROUTINE TESTING W REFLEX): HIV Screen 4th Generation wRfx: NONREACTIVE

## 2019-06-29 MED ORDER — CHLORHEXIDINE GLUCONATE CLOTH 2 % EX PADS
6.0000 | MEDICATED_PAD | Freq: Every day | CUTANEOUS | Status: DC
  Administered 2019-06-29 – 2019-07-04 (×5): 6 via TOPICAL

## 2019-06-29 MED ORDER — CHLORHEXIDINE GLUCONATE CLOTH 2 % EX PADS
6.0000 | MEDICATED_PAD | Freq: Every day | CUTANEOUS | Status: DC
  Administered 2019-06-30 – 2019-07-04 (×5): 6 via TOPICAL

## 2019-06-29 MED ORDER — ENOXAPARIN SODIUM 40 MG/0.4ML ~~LOC~~ SOLN
40.0000 mg | SUBCUTANEOUS | Status: DC
  Administered 2019-06-29 – 2019-07-04 (×5): 40 mg via SUBCUTANEOUS
  Filled 2019-06-29 (×5): qty 0.4

## 2019-06-29 MED ORDER — SODIUM CHLORIDE 0.9% FLUSH
10.0000 mL | INTRAVENOUS | Status: DC | PRN
  Administered 2019-07-04: 10 mL

## 2019-06-29 MED ORDER — OXYCODONE HCL 5 MG PO TABS
5.0000 mg | ORAL_TABLET | ORAL | Status: DC | PRN
  Administered 2019-06-29 – 2019-07-04 (×17): 10 mg via ORAL
  Filled 2019-06-29 (×17): qty 2

## 2019-06-29 NOTE — Progress Notes (Signed)
Subjective: No new complaints   Antibiotics:  Anti-infectives (From admission, onward)   Start     Dose/Rate Route Frequency Ordered Stop   06/29/19 0500  vancomycin (VANCOCIN) IVPB 1000 mg/200 mL premix     1,000 mg 200 mL/hr over 60 Minutes Intravenous Every 12 hours 06/28/19 1557     06/28/19 1700  vancomycin (VANCOREADY) IVPB 1750 mg/350 mL     1,750 mg 175 mL/hr over 120 Minutes Intravenous  Once 06/28/19 1511 06/28/19 1800   06/28/19 1600  cefTRIAXone (ROCEPHIN) 2 g in sodium chloride 0.9 % 100 mL IVPB     2 g 200 mL/hr over 30 Minutes Intravenous Every 24 hours 06/28/19 1500     06/26/19 1500  cefTRIAXone (ROCEPHIN) 1 g in sodium chloride 0.9 % 100 mL IVPB  Status:  Discontinued     1 g 200 mL/hr over 30 Minutes Intravenous Every 24 hours 06/26/19 1452 06/26/19 1455      Medications: Scheduled Meds: . acetaminophen  1,000 mg Oral Q6H  . amLODipine  10 mg Oral Daily  . aspirin EC  81 mg Oral Daily  . DULoxetine  30 mg Oral Daily  . enoxaparin (LOVENOX) injection  40 mg Subcutaneous Q24H  . feeding supplement (ENSURE ENLIVE)  237 mL Oral TID BM  . finasteride  5 mg Oral Daily  . lidocaine  1 patch Transdermal Q24H  . multivitamin with minerals  1 tablet Oral Daily  . rosuvastatin  10 mg Oral Daily  . tamsulosin  0.8 mg Oral Daily   Continuous Infusions: . cefTRIAXone (ROCEPHIN)  IV 2 g (06/28/19 1508)  . vancomycin 1,000 mg (06/29/19 0559)   PRN Meds:.baclofen, magnesium oxide, oxyCODONE, polyethylene glycol, senna    Objective: Weight change:   Intake/Output Summary (Last 24 hours) at 06/29/2019 1230 Last data filed at 06/29/2019 0700 Gross per 24 hour  Intake 360 ml  Output 1050 ml  Net -690 ml   Blood pressure 123/72, pulse 76, temperature (!) 97.5 F (36.4 C), temperature source Oral, resp. rate 17, height 5\' 10"  (1.778 m), weight 79.2 kg, SpO2 99 %. Temp:  [97.4 F (36.3 C)-98 F (36.7 C)] 97.5 F (36.4 C) (02/16 0800) Pulse Rate:   [76-102] 76 (02/16 0800) Resp:  [12-17] 17 (02/16 0800) BP: (99-126)/(68-83) 123/72 (02/16 0800) SpO2:  [96 %-100 %] 99 % (02/16 0800)  Physical Exam: General: Alert and awake, oriented x3, not in any acute distress. HEENT: anicteric sclera, EOMI CVS regular rate, normal  Chest: , no wheezing, no respiratory distress Abdomen: soft non-distended,  Extremities: deep wound on hip with bandage, wound to left foot Neuro: nonfocal  CBC:    BMET Recent Labs    06/28/19 0931 06/29/19 0631  NA 138 135  K 4.5 3.8  CL 101 99  CO2 27 26  GLUCOSE 104* 115*  BUN 19 17  CREATININE 0.61 0.60*  CALCIUM 9.0 8.8*     Liver Panel  No results for input(s): PROT, ALBUMIN, AST, ALT, ALKPHOS, BILITOT, BILIDIR, IBILI in the last 72 hours.     Sedimentation Rate No results for input(s): ESRSEDRATE in the last 72 hours. C-Reactive Protein No results for input(s): CRP in the last 72 hours.  Micro Results: Recent Results (from the past 720 hour(s))  SARS CORONAVIRUS 2 (TAT 6-24 HRS) Nasopharyngeal Nasopharyngeal Swab     Status: None   Collection Time: 05/31/19  3:18 PM   Specimen: Nasopharyngeal Swab  Result Value Ref  Range Status   SARS Coronavirus 2 NEGATIVE NEGATIVE Final    Comment: (NOTE) SARS-CoV-2 target nucleic acids are NOT DETECTED. The SARS-CoV-2 RNA is generally detectable in upper and lower respiratory specimens during the acute phase of infection. Negative results do not preclude SARS-CoV-2 infection, do not rule out co-infections with other pathogens, and should not be used as the sole basis for treatment or other patient management decisions. Negative results must be combined with clinical observations, patient history, and epidemiological information. The expected result is Negative. Fact Sheet for Patients: SugarRoll.be Fact Sheet for Healthcare Providers: https://www.woods-mathews.com/ This test is not yet approved or  cleared by the [place] FDA and  has been authorized for detection and/or diagnosis of SARS-CoV-2 by FDA under an Emergency Use Authorization (EUA). This EUA will remain  in effect (meaning this test can be used) for the duration of the COVID-19 declaration under Section 56 4(b)(1) of the Act, 21 U.S.C. section 360bbb-3(b)(1), unless the authorization is terminated or revoked sooner. Performed at [facility], [place] [address], [place], [place] [address]   SARS CORONAVIRUS 2 (TAT 6-24 HRS) Nasopharyngeal Nasopharyngeal Swab     Status: None   Collection Time: 06/02/19 10:18 AM   Specimen: Nasopharyngeal Swab  Result Value Ref Range Status   SARS Coronavirus 2 NEGATIVE NEGATIVE Final    Comment: (NOTE) SARS-CoV-2 target nucleic acids are NOT DETECTED. The SARS-CoV-2 RNA is generally detectable in upper and lower respiratory specimens during the acute phase of infection. Negative results do not preclude SARS-CoV-2 infection, do not rule out co-infections with other pathogens, and should not be used as the sole basis for treatment or other patient management decisions. Negative results must be combined with clinical observations, patient history, and epidemiological information. The expected result is Negative. Fact Sheet for Patients: SugarRoll.be Fact Sheet for Healthcare Providers: https://www.woods-mathews.com/ This test is not yet approved or cleared by the [place] FDA and  has been authorized for detection and/or diagnosis of SARS-CoV-2 by FDA under an Emergency Use Authorization (EUA). This EUA will remain  in effect (meaning this test can be used) for the duration of the COVID-19 declaration under Section 56 4(b)(1) of the Act, 21 U.S.C. section 360bbb-3(b)(1), unless the authorization is terminated or revoked sooner. Performed at [facility], [place] [address], [place], [place] [address]   Blood culture (routine x  2)     Status: None (Preliminary result)   Collection Time: 06/25/19  8:55 PM   Specimen: BLOOD RIGHT FOREARM  Result Value Ref Range Status   Specimen Description BLOOD RIGHT FOREARM  Final   Special Requests   Final    BOTTLES DRAWN AEROBIC AND ANAEROBIC Blood Culture adequate volume   Culture   Final    NO GROWTH 4 DAYS Performed at [facility], [place] [address], [place], [place] [address]    Report Status PENDING  Incomplete  Blood culture (routine x 2)     Status: None (Preliminary result)   Collection Time: 06/25/19  9:02 PM   Specimen: BLOOD RIGHT FOREARM  Result Value Ref Range Status   Specimen Description BLOOD RIGHT FOREARM  Final   Special Requests   Final    BOTTLES DRAWN AEROBIC AND ANAEROBIC Blood Culture results may not be optimal due to an excessive volume of blood received in culture bottles   Culture   Final    NO GROWTH 4 DAYS Performed at [facility], [place],  [place] [address]    Report Status PENDING  Incomplete  Urine Culture     Status: None   Collection Time: 06/25/19 10:50 PM   Specimen: Urine, Clean Catch  Result Value Ref Range Status   Specimen Description URINE, CLEAN CATCH  Final   Special Requests NONE  Final   Culture   Final    NO GROWTH Performed at [facility], [address], [place], [place] [address]    Report Status 06/27/2019 FINAL  Final  SARS CORONAVIRUS 2 (TAT 6-24 HRS) Nasopharyngeal Nasopharyngeal Swab     Status: None   Collection Time: 06/25/19 10:55 PM   Specimen: Nasopharyngeal Swab  Result Value Ref Range Status   SARS Coronavirus 2 NEGATIVE NEGATIVE Final    Comment: (NOTE) SARS-CoV-2 target nucleic acids are NOT DETECTED. The SARS-CoV-2 RNA is generally detectable in upper and lower respiratory specimens during the acute phase of infection. Negative results do not preclude SARS-CoV-2 infection, do not rule out co-infections with other pathogens, and should not be used as the sole  basis for treatment or other patient management decisions. Negative results must be combined with clinical observations, patient history, and epidemiological information. The expected result is Negative. Fact Sheet for Patients: SugarRoll.be Fact Sheet for Healthcare Providers: https://www.woods-mathews.com/ This test is not yet approved or cleared by the [place] FDA and  has been authorized for detection and/or diagnosis of SARS-CoV-2 by FDA under an Emergency Use Authorization (EUA). This EUA will remain  in effect (meaning this test can be used) for the duration of the COVID-19 declaration under Section 56 4(b)(1) of the Act, 21 U.S.C. section 360bbb-3(b)(1), unless the authorization is terminated or revoked sooner. Performed at [facility], [place] [address], [place], [place] [address]   Aerobic/Anaerobic Culture (surgical/deep wound)     Status: None (Preliminary result)   Collection Time: 06/28/19  2:36 PM   Specimen: PATH Disc; Tissue  Result Value Ref Range Status   Specimen Description WOUND  Final   Special Requests L1 L2 DISC ASPIRATE  Final   Gram Stain   Final    MODERATE WBC PRESENT,BOTH PMN AND MONONUCLEAR NO ORGANISMS SEEN    Culture   Final    NO GROWTH < 24 HOURS Performed at [facility], [address] [place], [place] [address]    Report Status PENDING  Incomplete    Studies/Results: MR HIP LEFT WO CONTRAST  Result Date: 06/29/2019 CLINICAL DATA:  Left hip pressure wound. EXAM: MR OF THE LEFT HIP WITHOUT CONTRAST TECHNIQUE: Multiplanar, multisequence MR imaging was performed. No intravenous contrast was administered. COMPARISON:  Left hip x-rays dated May 26, 2019. CT abdomen pelvis dated May 19, 2019. FINDINGS: Bones: There is no evidence of acute fracture, dislocation or avascular necrosis. No focal bone lesion. The visualized sacroiliac joints and symphysis pubis appear normal. Articular  cartilage and labrum Articular cartilage: No focal chondral defect or subchondral signal abnormality identified. Labrum: The left labrum is grossly intact. Right superior labral tear. Joint or bursal effusion Joint effusion: No significant hip joint effusion. Bursae: Prominent fluid in the left greater than right greater trochanteric bursa. Muscles and tendons Muscles and tendons: The visualized gluteus, hamstring and iliopsoas tendons appear normal. Edema within the bilateral gluteal muscle surrounding the greater trochanteric bursa. Other findings Miscellaneous: The visualized internal pelvic contents appear unremarkable. 7.8 cm gas and fluid collection the subcutaneous fat overlying the lateral left hip with surrounding soft tissue swelling. IMPRESSION: 1. 7.8  cm gas and fluid collection in the subcutaneous fat overlying the lateral left hip with surrounding soft tissue swelling, consistent with abscess. 2. Significant left and moderate right greater trochanteric bursitis. Given the proximity of the left greater trochanteric bursa to the overlying soft tissue abscess, septic bursitis is not excluded. 3. No evidence of osteomyelitis. 4. Right superior labral tear. Electronically Signed   By: Titus Dubin M.D.   On: 06/29/2019 07:46   [place] EKG SITE RITE  Result Date: 06/29/2019 If Site Rite image not attached, placement could not be confirmed due to current cardiac rhythm.     Assessment/Plan:  INTERVAL HISTORY:    Guided biopsy has been performed.  Cultures been taken antibiotics initiated.  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  He was eventually admitted to the hospital on 05/18/2019 and found to have Covid infection.  Admission notes also indicate that they thought he had new abdominal pain.  He was treated for community-acquired pneumonia and a UTI in addition to receiving 4 days of remdesivir and steroids before leaving [facility].  He continued to have delirium and severe back pain after discharge and laid on the floor for 4 days straight prior to being readmitted on 05/26/2019.  New left hip pressure wound.  He was treated empirically for possible sepsis and received 3 days of vancomycin, cefepime and metronidazole.  He was discharged on 06/09/2019.  Has not been able to walk more than 10 steps since he first got sick because of severe pain.  On 06/22/2019 which showed evidence of discitis and osteomyelitis at the L1-2 level with a 1 cm left psoas abscess.  He also had some early, increased signal at T10-11, L2-3 and L5-S  #1 Lumbar diskitis and osteomyelitis with left psoas muscle abscess  --IR to aspirate today. Antibiotics have been started and we will follow culture data  Okay to place PICC line  #2 hip deep pressure wound: He needs surgical goal debridement of this abscess and if he has in fact septic bursitis needs the help of an orthopedic surgeon to clean up that space.  Would also recommend getting cultures in the OR as these may be higher yield than from his back though they could be 2 different processes in terms of pathogenic bacteria     LOS: 4 days   Alcide Evener 06/29/2019, 12:30 PM

## 2019-06-29 NOTE — TOC Initial Note (Signed)
Transition of Care [facility]) - Initial/Assessment Note    Patient Details  Name: Ivan [name] MRN: [id number] Date of Birth: [birth year]  Transition of Care [facility] CM/SW Contact:    Marilu Favre, RN Phone Number: 06/29/2019, 11:36 AM  Clinical Narrative:                  Confirmed face sheet information with patient at bedside. Prior to admission he was at [facility]. PT recommending SNF.   Patient does not want to go to a SNF at discharge. He prefers to go home with wife and home health PT. Patient has wheel chair at home already. He would need walker and 3 in 1 .   He feels once his pain is controlled and he can stand to pivot he will be able to go home. Explained MD will decide when he is medically ready to be discharged.  Will need orders.        Patient Goals and CMS Choice Patient states their goals for this hospitalization and ongoing recovery are:: to go home at discharge CMS Medicare.gov Compare Post Acute Care list provided to:: Patient Choice offered to / list presented to : Patient  Expected Discharge Plan and Services       Post Acute Care Choice: Home Health, Durable Medical Equipment Living arrangements for the past 2 months: Single Family Home                                      Prior Living Arrangements/Services Living arrangements for the past 2 months: Single Family Home Lives with:: Spouse Patient language and need for interpreter reviewed:: Yes        Need for Family Participation in Patient Care: Yes (Comment) Care giver support system in place?: Yes (comment) Current home services: DME Criminal Activity/Legal Involvement Pertinent to Current Situation/Hospitalization: No - Comment as needed  Activities of Daily Living Home Assistive Devices/Equipment: Eyeglasses ADL Screening (condition at time of admission) Patient's cognitive ability adequate to safely complete daily activities?: Yes Is the patient deaf or have difficulty  hearing?: No Does the patient have difficulty seeing, even when wearing glasses/contacts?: No Does the patient have difficulty concentrating, remembering, or making decisions?: Yes Patient able to express need for assistance with ADLs?: Yes Does the patient have difficulty dressing or bathing?: Yes Independently performs ADLs?: No Does the patient have difficulty walking or climbing stairs?: Yes Weakness of Legs: Both Weakness of Arms/Hands: None  Permission Sought/Granted   Permission granted to share information with : No              Emotional Assessment Appearance:: Appears stated age Attitude/Demeanor/Rapport: Engaged Affect (typically observed): Accepting Orientation: : Oriented to Self, Oriented to Place, Oriented to  Time, Oriented to Situation Alcohol / Substance Use: Not Applicable Psych Involvement: No (comment)  Admission diagnosis:  Discitis of lumbar region [M46.46] Psoas abscess (HCC) [K68.12] Discitis, unspecified spinal region [M46.40] Osteomyelitis, unspecified site, unspecified type [facility]) [M86.9] Patient Active Problem List   Diagnosis Date Noted  . S/P CABG (coronary artery bypass graft) 06/26/2019  . BPH (benign prostatic hyperplasia) 06/26/2019  . History of COVID-19 06/26/2019  . Discitis of lumbar region 06/25/2019  . Pressure injury of skin 05/28/2019  . Delirium due to general medical condition   . Traumatic rhabdomyolysis ([place])   . Wound, open, hip or thigh with complication, left, initial encounter   .  Left against medical advice   . Metabolic encephalopathy   . Elevated CK   . Generalized weakness   . Slurred speech   . Pneumonia due to COVID-19 virus 05/19/2019  . AMS (altered mental status) 05/18/2019  . Benign prostatic hyperplasia with urinary obstruction 12/15/2015  . Pleurisy 08/10/2015  . Depression with anxiety 08/10/2015  . Urinary frequency 08/10/2015  . Insomnia 08/10/2015  . Other vascular headache   . Thrombus   . TIA  (transient ischemic attack)   . SOB (shortness of breath)   . Stroke ([place])   . Essential hypertension   . Hyperlipidemia   . ST elevation myocardial infarction (STEMI) involving right coronary artery in recovery phase ([place]) 10/12/2014  . DDD (degenerative disc disease), lumbar 05/16/2014  . Narcolepsy   . CAD (coronary artery disease)   . PTSD (post-traumatic stress disorder)    PCP:  Sherald Hess., MD Pharmacy:  No Pharmacies Listed    Social Determinants of Health (SDOH) Interventions    Readmission Risk Interventions No flowsheet data found.

## 2019-06-29 NOTE — Plan of Care (Signed)

## 2019-06-29 NOTE — Progress Notes (Signed)
Peripherally Inserted Central Catheter/Midline Placement  The IV Nurse has discussed with the patient and/or persons authorized to consent for the patient, the purpose of this procedure and the potential benefits and risks involved with this procedure.  The benefits include less needle sticks, lab draws from the catheter, and the patient may be discharged home with the catheter. Risks include, but not limited to, infection, bleeding, blood clot (thrombus formation), and puncture of an artery; nerve damage and irregular heartbeat and possibility to perform a PICC exchange if needed/ordered by physician.  Alternatives to this procedure were also discussed.  Bard Power PICC patient education guide, fact sheet on infection prevention and patient information card has been provided to patient /or left at bedside.    PICC/Midline Placement Documentation  PICC Single Lumen [id number] PICC Right Basilic 39 cm 1 cm (Active)  Indication for Insertion or Continuance of Line Home intravenous therapies (PICC only) 06/29/19 1853  Exposed Catheter (cm) 0 cm 06/29/19 1853  Site Assessment Clean;Dry;Intact 06/29/19 1853  Line Status Flushed;Blood return noted;Saline locked 06/29/19 1853  Dressing Type Transparent 06/29/19 1853  Dressing Status Clean;Dry;Antimicrobial disc in place;Intact 06/29/19 1853  Dressing Change Due 07/06/19 06/29/19 1853       Scotty Court 06/29/2019, 6:55 PM

## 2019-06-29 NOTE — Progress Notes (Signed)
Family Medicine Teaching Service Daily Progress Note Intern Pager: [phone]  Patient name: Ivan [name] Medical record number: [id number] Date of birth: 1945/12/01 Age: 74 y.o. Gender: male  Primary Care Provider: Sherald Hess., MD Consultants: IR, ID Code Status: Full  Pt Overview and Major Events to Date:  2/13 - admitted  Assessment and Plan: Malaquias Manca Newsomis a 74 y.o.malepresenting to the ED at direction of PCP due to increase inflammatory markers and concerning lumbar MRI with possible discitis/osteomyelitis/psoas abscess. PMH is significant forDDD, CAD, HTN, HLD, BPH, recent COVID+, DM, diet controlled.   MRI c/f Lumbar discitis/osteomyelitis and psoas abscess  Hx of DDD  Continues to have intermittent back discomfort. Lumbar MRI(06/22/2019) w/ findings concerning for discitis-osteomyelitis at L1-2 with a possible 1 cm left psoas abscess.s/p IR aspiration of thick fluid form L 1-2 space. Started on CTX and Vanc. MRI left hip 7.8 cm gas and fluid collection in the subcutaneous fat overlying the lateral left hip with surrounding soft tissue swelling, consistent with abscess. -Consult to infectious disease, appreciate recommendations: Start PICC line -Continue CTX (2/15-) Vanc (2/15-) -Consult to interventional radiology, appreciate recommendations -Consult to wound care appreciate recommendations -Consult to PT/OT eval and treat -Follow-up aspirate cultures -Pain management: -ContinueTylenol 1000mg  q6h -OxyIR 5 mg every 4 hours as needed for breakthrough pain -baclofen 10mg  TID PRNfor muscle spasms  - lidocaine patch q24hr  - mag oxide 400mg  PRN for muscle cramps   BPHStable.  Currently having issues attempting to urinate while laying down. Home medications areFinasteride 5mg andTamsulosin 0.8mg .  -continue home medications  Pressure Ulcers Has aknownpressure ulcer on left hip(healing, stage 3-4)with some  sloughing open to airw/ no streaking/purulence or noted cellulitis surrounding.Other pressure ulcer on left heel with a closed wound. Likely due to decrease in mobility over the last month. -Consult to wound care, appreciate recommendations  - left heel: foam dressing, change q3d.   - left hip: BID saline moistened gauze and ABD pads to promote  debridement.   CAD/Hx of MI EKG NSR. No chest pain or shortness of breath currently.  -Continue home ASA 81mg    HLD Last lipid panel 02/20/2018 Tot chol 150, LDL 73, HDL 47, triglycerides 107. 05/26/2019 triglycerides 112. Home medications areCrestor 10mg . -Continue home medications   HTN Normotensive since admission. Home medication areAmlodipine 10mg andLisinopril 40mg . -Continue home medications -monitor w/ vital signs per protocol  Hyperkalemia. Resolved. - 5.2 on 2/13. Repeat 4.3>4.6>4.5>3.8  RecentCOVIDinfection Tested positive 05/18/2019. Has since tested negative 06/02/2019. Does not have any respiratory symptoms with this admission but is coming from a skilled nursing facility. covid negative on 2/13.   Depression w/ anxiety Home medicationCymbalta 30mg . Continue home medication.  Diet controlled DM Last A1c 05/20/2019. Glu on admission BMP 154. Glu this morning 115.  -monitor on BMP  FEN/GI: Heart healthy diet -miralax PRN, senna PRN  PPx: Lovenox   Disposition: Home pending medical clearance  Subjective:  No concerns at this time.    Objective: Temp:  [97.4 F (36.3 C)-98 F (36.7 C)] 97.5 F (36.4 C) (02/16 0800) Pulse Rate:  [76-102] 76 (02/16 0800) Resp:  [12-17] 17 (02/16 0800) BP: (99-126)/(68-83) 123/72 (02/16 0800) SpO2:  [96 %-100 %] 99 % (02/16 0800)   Physical Exam:  General: Appears well, no acute distress. Age appropriate. Cardiac: RRR, normal heart sounds, no murmurs Respiratory: CTAB, normal effort Extremities: No edema or cyanosis. Skin: Warm and dry, no rashes noted Neuro: alert and  oriented, no focal deficits  Neuro:Laboratory: Recent Labs  Lab 06/27/19 1120 06/28/19 0931 06/29/19 0631  WBC 10.1 8.6 9.4  HGB 14.2 13.5 13.2  HCT 43.2 40.7 39.7  PLT 382 380 369   Recent Labs  Lab 06/27/19 1120 06/28/19 0931 06/29/19 0631  NA 136 138 135  K 4.6 4.5 3.8  CL 101 101 99  CO2 25 27 26   BUN 16 19 17   CREATININE 0.68 0.61 0.60*  CALCIUM 8.8* 9.0 8.8*  GLUCOSE 143* 104* 115*     Imaging/Diagnostic Tests:  MR OF THE LEFT HIP WITHOUT CONTRAST IMPRESSION: 1. 7.8 cm gas and fluid collection in the subcutaneous fat overlying the lateral left hip with surrounding soft tissue swelling, consistent with abscess. 2. Significant left and moderate right greater trochanteric bursitis. Given the proximity of the left greater trochanteric bursa to the overlying soft tissue abscess, septic bursitis is not excluded. 3. No evidence of osteomyelitis. 4. Right superior labral tear.  Gerlene Fee, DO 06/29/2019, 8:12 AM PGY-1, [place] Intern pager: [phone], text pages welcome
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
## 2019-06-29 NOTE — Discharge Summary (Addendum)
[facility] Discharge Summary  Patient name: Ivan [name] Medical record number: [id number] Date of birth: 1945/05/22 Age: 74 y.o. Gender: male Date of Admission: 06/25/2019  Date of Discharge: 07/04/2019 Admitting Physician: Gerlene Fee, DO  Primary Care Provider: Sherald Hess., MD Consultants: ID, IR, Plastics, Ortho  Indication for Hospitalization: Concern for osteomyelitis and psoas abscess  Discharge Diagnoses/Problem List:  Lower extremity weakness Osteomyelitis Psoas abscess DDD BPH Pressure ulcer CAD History of MI HLD Hypertension Recent Covid infection Depression Anxiety Diet-controlled type 2 diabetes  Disposition: Home with home health PT  Discharge Condition: Stable  Discharge Exam:  General: Pleasant demeanor alert and oriented.  Laying in bed. No acute distress.  Cardiovascular: RRR. No murmurs.  2+ radial pulses.   Respiratory: LCTAB.  No crackles or wheezing.  Abdomen: soft, nontender. Normal bowel sounds.  Extremities: strength in lower extremities 5/5 and equal bilaterally.  Performed by Dr. Marny Lowenstein   Brief Hospital Course:  Ivan [name] Newsomis a 74 y.o.male whopresented to the ED at direction of PCP due to increase inflammatory markers and concerning lumbar MRI with possible discitis/osteomyelitis/psoas abscess. PMH is significant forDDD, CAD, HTN, HLD, BPH, recent COVID+, DM, diet controlled.   MRI c/f Lumbar discitis/osteomyelitis and psoas abscess  Hx of DDD Patient presented to the ED today at the instruction of his PCP due to abnormal lab work yesterday in congruence with a concerning lumbar MRI.  He has been experiencing worsening back pain for 1 month (since January 1) and is not able to sit or stand vertically as this causes him "crippling" pain.     Lumbar MRI(06/22/2019) w/ findings concerning for discitis-osteomyelitis at L1-2 with a possible 1 cm left psoas abscess. Lab work obtained by  PCP 06/24/2019 showed CRP 23.6, WBC 12.6, platelets 469, sed rate 63. Has not had a fever, Tmax 99 F.  In the ED, VSS, WBC 9.5, platelets 365, sed rate 53, UA normal.Neurosurgery was consulted by ED provider and MRI reviewed as there was no cord involvement suggestion was made for ID and IR consult.  ID consulted and recommended IR biopsy before starting antibiotics. 06/28/2019 IR aspiration of 54m of thick fluid form L 1-2 space culture was without growth for 5 days. Initially started on CTX and Vancomycin but was discharged on outpatient IV cefepime and vancomycin due to ampicillin sensitive Enterococcus faecalis and Enterobacter species from hip wound disc aspirate.  This patient remained afebrile during entire hospitalization.  SNF recommended; patient refused and was discharged home with home health and PT/OT.   Pressure Ulcers Has apressure ulcer on left hip(healing, stage 3-4)with some sloughing open to airw/ no streaking/purulence or noted cellulitis surrounding.Other pressure ulcer on left heel with a closed wound. Likely due to decrease in mobility over the last month. Consult to wound care on admission, Left heel: foam dressing, change q3d. Left hip: BID saline moistened gauze and ABD pads to promote debridement.  MRI left hip 7.8 cm gas and fluid collection in the subcutaneous fat overlying the lateral left hip with surrounding soft tissue swelling, consistent with abscess. Ortho surgery consulted and did not appreciate any evidence clinically of septic bursitis or arthritis. Plastic surgery was consulted. Left hip debridement with wound vac placement performed 2/18. Wound culture grew ampicillin sensitive Enterococcus faecalis and Enterobacter species. Antibiotics were changed to vancomycin and cefepime and patient was set up with outpatient IV antibiotics.  Issues for Follow Up:  1. Needs 8 weeks of antibiotics. Outpatient set up  by infectious disease. 2. Needs follow up with  infectious disease 08/02/2019 appt. Made with Dr. Tommy Medal 3. Needs continued PT/OT and home health 4. Needs continued wound vac care - Spoke with Dr. Marla Roe on 2/21 and she is arranging for him to have an at home wound vac delivered and set up via home health on 07/05/2019.  Significant Procedures: FLUORO GUIDED DISC ASPIRATION AT L1-L2, Irrigation and dibridement of left hip wound and application of wound vac.  Significant Labs and Imaging:  Recent Labs  Lab 06/27/19 1120 06/28/19 0931 06/29/19 0631  WBC 10.1 8.6 9.4  HGB 14.2 13.5 13.2  HCT 43.2 40.7 39.7  PLT 382 380 369   Recent Labs  Lab 06/25/19 2102 06/25/19 2102 06/26/19 0259 06/26/19 1114 06/27/19 1120 06/27/19 1120 06/28/19 0931 06/29/19 0631  NA 135  --  138  --  136  --  138 135  K 4.3   < > 5.2*   < > 4.6   < > 4.5 3.8  CL 101  --  103  --  101  --  101 99  CO2 24  --  26  --  25  --  27 26  GLUCOSE 154*  --  101*  --  143*  --  104* 115*  BUN 20  --  20  --  16  --  19 17  CREATININE 0.60*  --  0.62  --  0.68  --  0.61 0.60*  CALCIUM 8.9  --  9.1  --  8.8*  --  9.0 8.8*   < > = values in this interval not displayed.   Aerobic/Anaerobic Culture (surgical/deep wound) [[id number]] Collected: 06/28/19 1436  Order Status: Completed Specimen: Tissue from PATH Disc Updated: 07/03/19 1137   Specimen Description WOUND   Special Requests L1 L2 DISC ASPIRATE   Gram Stain --   MODERATE WBC PRESENT,BOTH PMN AND MONONUCLEAR  NO ORGANISMS SEEN  Performed at [facility], [place] [address], [place], [place] [address]    Culture No growth aerobically or anaerobically.   Report Status 07/03/2019 FINAL  Aerobic/Anaerobic Culture (surgical/deep wound) [[id number]]  Collected: 07/01/19 0807  Order Status: Completed Specimen: PATH Soft tissue Updated: 07/03/19 1005   Specimen Description WOUND LEFT HIP   Special Requests PATIENT ON FOLLOWING VANCOMYCIN, ROCEPHIN   Gram Stain --   NO WBC SEEN  RARE GRAM POSITIVE COCCI  IN PAIRS  Performed at [facility], [place] [address], [place], [place] [address]    Culture --   RARE ENTEROBACTER AEROGENES  FEW ENTEROCOCCUS FAECALIS  NO ANAEROBES ISOLATED; CULTURE IN PROGRESS FOR 5 DAYS    Report Status PENDING   Organism ID, Bacteria ENTEROBACTER AEROGENES   Organism ID, Bacteria ENTEROCOCCUS FAECALIS   FLUORO GUIDED DISC ASPIRATION AT L1-L2 06/28/2019 IMPRESSION: Status post fluoroscopic guided disc aspiration at L1-L2 with 2 passes for a total volume of approximately 2 mL.  MR OF THE LEFT HIP WITHOUT CONTRAST 06/29/2019 COMPARISON:  Left hip x-rays dated May 26, 2019. CT abdomen pelvis dated May 19, 2019. IMPRESSION: 1. 7.8 cm gas and fluid collection in the subcutaneous fat overlying the lateral left hip with surrounding soft tissue swelling, consistent with abscess. 2. Significant left and moderate right greater trochanteric bursitis. Given the proximity of the left greater trochanteric bursa to the overlying soft tissue abscess, septic bursitis is not excluded. 3. No evidence of osteomyelitis. 4. Right superior labral tear.  MRI LUMBAR SPINE WITHOUT CONTRAST 06/22/2019  COMPARISON:  02/18/2019 lumbar spine MRI. 05/19/2019 CT abdomen and pelvis. IMPRESSION: 1. Findings concerning for discitis-osteomyelitis at L1-2 with a possible 1 cm left psoas abscess. No evidence of epidural abscess. 2. Less pronounced fluid signal in the T10-11, L2-3, and L5-S1 discs without convincing evidence of osteomyelitis at these levels. 3. Increased, mild spinal stenosis at L1-2. 4. Up to mild spinal and severe neural foraminal stenosis at other levels as above.  Results/Tests Pending at Time of Discharge: N/A  Discharge Medications:  Allergies as of 07/04/2019      Reactions   Contrast Media [iodinated Diagnostic Agents] Other (See Comments)   Vascular headache      Medication List    STOP taking these medications   Lidocaine 4 % Ptch Replaced by:  lidocaine 5 % You also have another medication with the same name that you need to continue taking as instructed.   traMADol 50 MG tablet Commonly known as: ULTRAM     TAKE these medications   acetaminophen 500 MG tablet Commonly known as: TYLENOL Take 2 tablets (1,000 mg total) by mouth every 6 (six) hours. What changed:   when to take this  additional instructions   amLODipine 10 MG tablet Commonly known as: NORVASC Take 1 tablet (10 mg total) by mouth daily.   aspirin 81 MG EC tablet Take 1 tablet (81 mg total) by mouth daily.   baclofen 10 MG tablet Commonly known as: LIORESAL Take 1 tablet (10 mg total) by mouth 3 (three) times daily. What changed:   how much to take  when to take this  reasons to take this   ceFEPime  IVPB Commonly known as: MAXIPIME Inject 2 g into the vein every 8 (eight) hours. Indication:  Lumbar Osteomyelitis / Discitis  Last Day of Therapy:  08/20/19 Labs - Once weekly:  CBC/D and BMP, Labs - Every other week:  ESR and CRP   DULoxetine 30 MG capsule Commonly known as: CYMBALTA Take 1 capsule (30 mg total) by mouth 2 (two) times daily. What changed: when to take this   feeding supplement (PRO-STAT SUGAR FREE 64) Liqd Take 60 mLs by mouth daily.   finasteride 5 MG tablet Commonly known as: PROSCAR Take 1 tablet (5 mg total) by mouth daily.   gabapentin 100 MG capsule Commonly known as: NEURONTIN Take 200 mg by mouth at bedtime.   lidocaine 5 % Commonly known as: LIDODERM Place 1 patch onto the skin daily. Remove & Discard patch within 12 hours or as directed by MD What changed:   Another medication with the same name was added. Make sure you understand how and when to take each.  Another medication with the same name was removed. Continue taking this medication, and follow the directions you see here.   lidocaine 5 % Commonly known as: LIDODERM Place 1 patch onto the skin daily. Remove & Discard patch within 12 hours or as  directed by MD What changed: You were already taking a medication with the same name, and this prescription was added. Make sure you understand how and when to take each. Replaces: Lidocaine 4 % Ptch   lisinopril 40 MG tablet Commonly known as: ZESTRIL TAKE 1 TABLET BY MOUTH EVERY DAY   magnesium oxide 400 MG tablet Commonly known as: MAG-OX Take 400 mg by mouth daily as needed (muscle cramps).   multivitamin with minerals Tabs tablet Take 1 tablet by mouth daily.   naproxen 250 MG tablet Commonly known as: NAPROSYN Take 1  tablet (250 mg total) by mouth every 8 (eight) hours as needed for moderate pain.   oxyCODONE 5 MG immediate release tablet Commonly known as: Oxy IR/ROXICODONE Take 1-2 tablets (5-10 mg total) by mouth every 4 (four) hours as needed for severe pain or breakthrough pain.   polyethylene glycol 17 g packet Commonly known as: MIRALAX / GLYCOLAX Take 17 g by mouth daily as needed for mild constipation.   rosuvastatin 10 MG tablet Commonly known as: CRESTOR Take 1 tablet (10 mg total) by mouth daily.   senna 8.6 MG Tabs tablet Commonly known as: SENOKOT Take 1 tablet (8.6 mg total) by mouth at bedtime as needed for mild constipation.   tamsulosin 0.4 MG Caps capsule Commonly known as: FLOMAX Take 2 capsules (0.8 mg total) by mouth daily.   vancomycin  IVPB Inject 1,000 mg into the vein every 12 (twelve) hours. Indication:  Lumbar discitis Last Day of Therapy:  08/20/19 Labs - Sunday/Monday:  CBC/D, BMP, and vancomycin trough. Labs - Thursday:  BMP and vancomycin trough Labs - Every other week:  ESR and CRP            Home Infusion Instuctions  (From admission, onward)         Start     Ordered   07/03/19 0000  Home infusion instructions    Question:  Instructions  Answer:  Flushing of vascular access device: 0.9% NaCl pre/post medication administration and prn patency; Heparin 100 u/ml, 5ml for implanted ports and Heparin 10u/ml, 5ml for all other  central venous catheters.   07/03/19 1818   07/03/19 0000  Home infusion instructions    Question:  Instructions  Answer:  Flushing of vascular access device: 0.9% NaCl pre/post medication administration and prn patency; Heparin 100 u/ml, 5ml for implanted ports and Heparin 10u/ml, 5ml for all other central venous catheters.   07/03/19 1818           Durable Medical Equipment  (From admission, onward)         Start     Ordered   07/04/19 0851  For home use only DME Walker rolling  Once    Question Answer Comment  Walker: With 5 Inch Wheels   Patient needs a walker to treat with the following condition Weakness      07/04/19 0851   07/04/19 0544  For home use only DME 3 n 1  Once     07/04/19 0543   06/30/19 1325  For home use only DME Walker rolling  Once    Question Answer Comment  Walker: With 5 Inch Wheels   Patient needs a walker to treat with the following condition Weakness      06/30/19 1324   06/30/19 1325  For home use only DME 3 n 1  Once     02 /17/21 1324          Discharge Instructions: Please refer to Patient Instructions section of EMR for full details.  Patient was counseled important signs and symptoms that should prompt return to medical care, changes in medications, dietary instructions, activity restrictions, and follow up appointments.   Follow-Up Appointments:   Future Appointments  Date Time Provider [place]  08/02/2019  9:15 AM Tommy Medal, Lavell Islam, MD [facility]    [place], [place], [place] 07/04/2019, 5:33 PM PGY-1, [facility]  Resident Attestation   I saw and evaluated the patient, performing the key elements of the service. I personally performed or re-performed the  history, physical exam, and medical decision making activities of this service and have verified that the service and findings are accurately documented in the resident's note. I developed the management plan that is described in the resident's note,  and I agree with the content, with my edits above in red.   Harolyn Rutherford, DO [facility], PGY-3

## 2019-06-29 NOTE — Progress Notes (Signed)
Physical Therapy Treatment Patient Details Name: Ivan [name] MRN: [id number] DOB: [birth year] Today's Date: 06/29/2019    History of Present Illness Pt is a 74 y/o male admitted secondary to acute on chronic back pain. MRI revealed lumbar discitis/osteomyelitis and psoas abscess. Of note, pt with recent admissions secondary to [name] related AMS with d/c on 1/27 to [facility] SN[facility]. PMH including but not limited to CAD, HTN, HLD, BPH, recent COVID+, DM.    PT Comments    Pt reports stool incontinence on arrival with very small BM present. Pt assisted for pericare and able to progress mobility to sitting, standing and transfer with Stedy today. Pt declined standing with RW or stepping this session but improving with tolerance for OOB and HEP. Pt educated for HEP and progression with pt stating he would like to attempt taking steps next session. Will continue to follow.     Follow Up Recommendations  SNF     Equipment Recommendations  None recommended by PT    Recommendations for Other Services       Precautions / Restrictions Precautions Precautions: Fall Restrictions Weight Bearing Restrictions: No    Mobility  Bed Mobility Overal bed mobility: Needs Assistance Bed Mobility: Rolling;Sidelying to Sit Rolling: Supervision Sidelying to sit: Min assist       General bed mobility comments: cues for sequence with use of rails and assist or pericare in rolling. left side to sit with cues and increased time and assist to elevate trunk  Transfers Overall transfer level: Needs assistance   Transfers: Sit to/from Stand Sit to Stand: Min guard         General transfer comment: pt able to stand with use of stedy x 2 trials with guarding for safety and cues for use. Pt remained standing grossly 30 sec each trial but denied stepping or further standing this session  Ambulation/Gait                 Stairs             Wheelchair Mobility    Modified  Rankin (Stroke Patients Only)       Balance Overall balance assessment: Needs assistance   Sitting balance-Leahy Scale: Fair Sitting balance - Comments: able to sit EOb without assist     Standing balance-Leahy Scale: Poor Standing balance comment: bil UE support in standing                            Cognition Arousal/Alertness: Awake/alert Behavior During Therapy: WFL for tasks assessed/performed Overall Cognitive Status: Impaired/Different from baseline Area of Impairment: Problem solving;Safety/judgement;Memory                 Orientation Level: Time;Disoriented to Current Attention Level: Selective Memory: Decreased short-term memory Following Commands: Follows one step commands consistently Safety/Judgement: Decreased awareness of deficits;Decreased awareness of safety            Exercises General Exercises - Lower Extremity Long Arc Quad: AROM;Both;Seated;20 reps Hip ABduction/ADduction: AROM;Both;Seated;20 reps Hip Flexion/Marching: AROM;Both;Seated;10 reps    General Comments        Pertinent Vitals/Pain Pain Score: 7  Pain Location: low back Pain Descriptors / Indicators: Aching;Guarding;Constant Pain Intervention(s): Limited activity within patient's tolerance;Monitored during session;Repositioned;RN gave pain meds during session    Home Living                      Prior Function  PT Goals (current goals can now be found in the care plan section) Progress towards PT goals: Progressing toward goals    Frequency    Min 2X/week      PT Plan Current plan remains appropriate    Co-evaluation              AM-PAC PT "6 Clicks" Mobility   Outcome Measure  Help needed turning from your back to your side while in a flat bed without using bedrails?: A Little Help needed moving from lying on your back to sitting on the side of a flat bed without using bedrails?: A Little Help needed moving to and from a  bed to a chair (including a wheelchair)?: A Little Help needed standing up from a chair using your arms (e.g., wheelchair or bedside chair)?: A Little Help needed to walk in hospital room?: Total Help needed climbing 3-5 steps with a railing? : Total 6 Click Score: 14    End of Session Equipment Utilized During Treatment: Gait belt Activity Tolerance: Patient limited by pain Patient left: in chair;with call bell/phone within reach;with chair alarm set Nurse Communication: Mobility status;Precautions;Need for lift equipment PT Visit Diagnosis: Other abnormalities of gait and mobility (R26.89);Pain     Time: [id number] PT Time Calculation (min) (ACUTE ONLY): 21 min  Charges:  $Therapeutic Activity: 8-22 mins                     Geordie Nooney P, PT Acute Rehabilitation Services Pager: [phone] Office: [place] 06/29/2019, 1:47 PM

## 2019-06-30 DIAGNOSIS — K6812 Psoas muscle abscess: Secondary | ICD-10-CM

## 2019-06-30 DIAGNOSIS — M869 Osteomyelitis, unspecified: Secondary | ICD-10-CM

## 2019-06-30 LAB — CBC
HCT: 38.9 % — ABNORMAL LOW (ref 39.0–52.0)
Hemoglobin: 12.9 g/dL — ABNORMAL LOW (ref 13.0–17.0)
MCH: 30.9 pg (ref 26.0–34.0)
MCHC: 33.2 g/dL (ref 30.0–36.0)
MCV: 93.1 fL (ref 80.0–100.0)
Platelets: 375 10*3/uL (ref 150–400)
RBC: 4.18 MIL/uL — ABNORMAL LOW (ref 4.22–5.81)
RDW: 12.8 % (ref 11.5–15.5)
WBC: 9.9 10*3/uL (ref 4.0–10.5)
nRBC: 0 % (ref 0.0–0.2)

## 2019-06-30 LAB — BASIC METABOLIC PANEL
Anion gap: 12 (ref 5–15)
BUN: 19 mg/dL (ref 8–23)
CO2: 27 mmol/L (ref 22–32)
Calcium: 8.9 mg/dL (ref 8.9–10.3)
Chloride: 98 mmol/L (ref 98–111)
Creatinine, Ser: 0.71 mg/dL (ref 0.61–1.24)
GFR calc Af Amer: >60 mL/min (ref >60)
GFR calc non Af Amer: >60 mL/min (ref >60)
Glucose, Bld: 121 mg/dL — ABNORMAL HIGH (ref 70–99)
Potassium: 4 mmol/L (ref 3.5–5.1)
Sodium: 137 mmol/L (ref 135–145)

## 2019-06-30 LAB — CULTURE, BLOOD (ROUTINE X 2)
Culture: NO GROWTH
Culture: NO GROWTH
Special Requests: ADEQUATE

## 2019-06-30 LAB — VANCOMYCIN, PEAK: Vancomycin Pk: 37 ug/mL (ref 30–40)

## 2019-06-30 MED ORDER — CHLORHEXIDINE GLUCONATE CLOTH 2 % EX PADS
6.0000 | MEDICATED_PAD | Freq: Once | CUTANEOUS | Status: AC
  Administered 2019-06-30: 23:00:00 6 via TOPICAL

## 2019-06-30 MED ORDER — BACLOFEN 5 MG HALF TABLET
5.0000 mg | ORAL_TABLET | Freq: Three times a day (TID) | ORAL | Status: DC
  Administered 2019-06-30 – 2019-07-03 (×9): 5 mg via ORAL
  Filled 2019-06-30 (×9): qty 1

## 2019-06-30 MED ORDER — CHLORHEXIDINE GLUCONATE CLOTH 2 % EX PADS
6.0000 | MEDICATED_PAD | Freq: Once | CUTANEOUS | Status: AC
  Administered 2019-06-30: 6 via TOPICAL

## 2019-06-30 NOTE — Progress Notes (Signed)
Subjective:  Patient complains that his pain is not sufficiently controlled to allow him to get out of bed and participate in physical therapy  Antibiotics:  Anti-infectives (From admission, onward)   Start     Dose/Rate Route Frequency Ordered Stop   06/29/19 0500  vancomycin (VANCOCIN) IVPB 1000 mg/200 mL premix     1,000 mg 200 mL/hr over 60 Minutes Intravenous Every 12 hours 06/28/19 1557     06/28/19 1700  vancomycin (VANCOREADY) IVPB 1750 mg/350 mL     1,750 mg 175 mL/hr over 120 Minutes Intravenous  Once 06/28/19 1511 06/28/19 1800   06/28/19 1600  cefTRIAXone (ROCEPHIN) 2 g in sodium chloride 0.9 % 100 mL IVPB     2 g 200 mL/hr over 30 Minutes Intravenous Every 24 hours 06/28/19 1500     06/26/19 1500  cefTRIAXone (ROCEPHIN) 1 g in sodium chloride 0.9 % 100 mL IVPB  Status:  Discontinued     1 g 200 mL/hr over 30 Minutes Intravenous Every 24 hours 06/26/19 1452 06/26/19 1455      Medications: Scheduled Meds: . acetaminophen  1,000 mg Oral Q6H  . amLODipine  10 mg Oral Daily  . aspirin EC  81 mg Oral Daily  . Chlorhexidine Gluconate Cloth  6 each Topical Daily  . Chlorhexidine Gluconate Cloth  6 each Topical Daily  . DULoxetine  30 mg Oral Daily  . enoxaparin (LOVENOX) injection  40 mg Subcutaneous Q24H  . feeding supplement (ENSURE ENLIVE)  237 mL Oral TID BM  . finasteride  5 mg Oral Daily  . lidocaine  1 patch Transdermal Q24H  . multivitamin with minerals  1 tablet Oral Daily  . rosuvastatin  10 mg Oral Daily  . tamsulosin  0.8 mg Oral Daily   Continuous Infusions: . cefTRIAXone (ROCEPHIN)  IV 2 g (06/29/19 1754)  . vancomycin 1,000 mg (06/30/19 0550)   PRN Meds:.baclofen, magnesium oxide, oxyCODONE, polyethylene glycol, senna, sodium chloride flush    Objective: Weight change:   Intake/Output Summary (Last 24 hours) at 06/30/2019 1349 Last data filed at 06/30/2019 0900 Gross per 24 hour  Intake 826.5 ml  Output 950 ml  Net -123.5 ml    Blood pressure 109/71, pulse 73, temperature (!) 97.5 F (36.4 C), temperature source Oral, resp. rate 17, height 5\' 10"  (1.778 m), weight 79.2 kg, SpO2 98 %. Temp:  [97.5 F (36.4 C)-98.4 F (36.9 C)] 97.5 F (36.4 C) (02/17 0731) Pulse Rate:  [73-100] 73 (02/17 0731) Resp:  [14-19] 17 (02/17 0731) BP: (100-113)/(64-71) 109/71 (02/17 0731) SpO2:  [94 %-98 %] 98 % (02/17 0731)  Physical Exam: General: Alert and awake, oriented x3, not in any acute distress. HEENT: anicteric sclera, EOMI CVS regular rate, normal  Chest: , no wheezing, no respiratory distress Abdomen: soft non-distended,  Extremities: deep wound on hip with bandage, wound to left foot Neuro: nonfocal  CBC:    BMET Recent Labs    06/29/19 0631 06/30/19 0322  NA 135 137  K 3.8 4.0  CL 99 98  CO2 26 27  GLUCOSE 115* 121*  BUN 17 19  CREATININE 0.60* 0.71  CALCIUM 8.8* 8.9     Liver Panel  No results for input(s): PROT, ALBUMIN, AST, ALT, ALKPHOS, BILITOT, BILIDIR, IBILI in the last 72 hours.     Sedimentation Rate No results for input(s): ESRSEDRATE in the last 72 hours. C-Reactive Protein No results for input(s): CRP in the last 72 hours.  Micro Results: Recent Results (from the past 720 hour(s))  SARS CORONAVIRUS 2 (TAT 6-24 HRS) Nasopharyngeal Nasopharyngeal Swab     Status: None   Collection Time: 05/31/19  3:18 PM   Specimen: Nasopharyngeal Swab  Result Value Ref Range Status   SARS Coronavirus 2 NEGATIVE NEGATIVE Final    Comment: (NOTE) SARS-CoV-2 target nucleic acids are NOT DETECTED. The SARS-CoV-2 RNA is generally detectable in upper and lower respiratory specimens during the acute phase of infection. Negative results do not preclude SARS-CoV-2 infection, do not rule out co-infections with other pathogens, and should not be used as the sole basis for treatment or other patient management decisions. Negative results must be combined with clinical observations, patient  history, and epidemiological information. The expected result is Negative. Fact Sheet for Patients: SugarRoll.be Fact Sheet for Healthcare Providers: https://www.woods-mathews.com/ This test is not yet approved or cleared by the [place] FDA and  has been authorized for detection and/or diagnosis of SARS-CoV-2 by FDA under an Emergency Use Authorization (EUA). This EUA will remain  in effect (meaning this test can be used) for the duration of the COVID-19 declaration under Section 56 4(b)(1) of the Act, 21 U.S.C. section 360bbb-3(b)(1), unless the authorization is terminated or revoked sooner. Performed at [facility], [place] [address], [place], [place] [address]   SARS CORONAVIRUS 2 (TAT 6-24 HRS) Nasopharyngeal Nasopharyngeal Swab     Status: None   Collection Time: 06/02/19 10:18 AM   Specimen: Nasopharyngeal Swab  Result Value Ref Range Status   SARS Coronavirus 2 NEGATIVE NEGATIVE Final    Comment: (NOTE) SARS-CoV-2 target nucleic acids are NOT DETECTED. The SARS-CoV-2 RNA is generally detectable in upper and lower respiratory specimens during the acute phase of infection. Negative results do not preclude SARS-CoV-2 infection, do not rule out co-infections with other pathogens, and should not be used as the sole basis for treatment or other patient management decisions. Negative results must be combined with clinical observations, patient history, and epidemiological information. The expected result is Negative. Fact Sheet for Patients: SugarRoll.be Fact Sheet for Healthcare Providers: https://www.woods-mathews.com/ This test is not yet approved or cleared by the [place] FDA and  has been authorized for detection and/or diagnosis of SARS-CoV-2 by FDA under an Emergency Use Authorization (EUA). This EUA will remain  in effect (meaning this test can be used) for the duration of  the COVID-19 declaration under Section 56 4(b)(1) of the Act, 21 U.S.C. section 360bbb-3(b)(1), unless the authorization is terminated or revoked sooner. Performed at [facility], [place] [address] [place], [place] [address]   Blood culture (routine x 2)     Status: None   Collection Time: 06/25/19  8:55 PM   Specimen: BLOOD RIGHT FOREARM  Result Value Ref Range Status   Specimen Description BLOOD RIGHT FOREARM  Final   Special Requests   Final    BOTTLES DRAWN AEROBIC AND ANAEROBIC Blood Culture adequate volume   Culture   Final    NO GROWTH 5 DAYS Performed at [facility], [place] [address], [place],  [address]    Report Status 06/30/2019 FINAL  Final  Blood culture (routine x 2)     Status: None   Collection Time: 06/25/19  9:02 PM   Specimen: BLOOD RIGHT FOREARM  Result Value Ref Range Status   Specimen Description BLOOD RIGHT FOREARM  Final   Special Requests   Final    BOTTLES DRAWN AEROBIC AND ANAEROBIC Blood Culture results  may not be optimal due to an excessive volume of blood received in culture bottles   Culture   Final    NO GROWTH 5 DAYS Performed at [facility], [place] [address] [place], [place] [address]    Report Status 06/30/2019 FINAL  Final  Urine Culture     Status: None   Collection Time: 06/25/19 10:50 PM   Specimen: Urine, Clean Catch  Result Value Ref Range Status   Specimen Description URINE, CLEAN CATCH  Final   Special Requests NONE  Final   Culture   Final    NO GROWTH Performed at [facility], [place] [address] [place], [place] [address]    Report Status 06/27/2019 FINAL  Final  SARS CORONAVIRUS 2 (TAT 6-24 HRS) Nasopharyngeal Nasopharyngeal Swab     Status: None   Collection Time: 06/25/19 10:55 PM   Specimen: Nasopharyngeal Swab  Result Value Ref Range Status   SARS Coronavirus 2 NEGATIVE NEGATIVE Final    Comment: (NOTE) SARS-CoV-2 target nucleic acids are NOT DETECTED. The SARS-CoV-2 RNA is generally  detectable in upper and lower respiratory specimens during the acute phase of infection. Negative results do not preclude SARS-CoV-2 infection, do not rule out co-infections with other pathogens, and should not be used as the sole basis for treatment or other patient management decisions. Negative results must be combined with clinical observations, patient history, and epidemiological information. The expected result is Negative. Fact Sheet for Patients: SugarRoll.be Fact Sheet for Healthcare Providers: https://www.woods-mathews.com/ This test is not yet approved or cleared by the [place] FDA and  has been authorized for detection and/or diagnosis of SARS-CoV-2 by FDA under an Emergency Use Authorization (EUA). This EUA will remain  in effect (meaning this test can be used) for the duration of the COVID-19 declaration under Section 56 4(b)(1) of the Act, 21 U.S.C. section 360bbb-3(b)(1), unless the authorization is terminated or revoked sooner. Performed at [facility], [place] [address] [place], [place] [address]   Aerobic/Anaerobic Culture (surgical/deep wound)     Status: None (Preliminary result)   Collection Time: 06/28/19  2:36 PM   Specimen: PATH Disc; Tissue  Result Value Ref Range Status   Specimen Description WOUND  Final   Special Requests L1 L2 DISC ASPIRATE  Final   Gram Stain   Final    MODERATE WBC PRESENT,BOTH PMN AND MONONUCLEAR NO ORGANISMS SEEN    Culture   Final    NO GROWTH 2 DAYS NO ANAEROBES ISOLATED; CULTURE IN PROGRESS FOR 5 DAYS Performed at [facility], [address], [place], [place] [address]    Report Status PENDING  Incomplete    Studies/Results: MR HIP LEFT WO CONTRAST  Result Date: 06/29/2019 CLINICAL DATA:  Left hip pressure wound. EXAM: MR OF THE LEFT HIP WITHOUT CONTRAST TECHNIQUE: Multiplanar, multisequence MR imaging was performed. No intravenous contrast was administered.  COMPARISON:  Left hip x-rays dated May 26, 2019. CT abdomen pelvis dated May 19, 2019. FINDINGS: Bones: There is no evidence of acute fracture, dislocation or avascular necrosis. No focal bone lesion. The visualized sacroiliac joints and symphysis pubis appear normal. Articular cartilage and labrum Articular cartilage: No focal chondral defect or subchondral signal abnormality identified. Labrum: The left labrum is grossly intact. Right superior labral tear. Joint or bursal effusion Joint effusion: No significant hip joint effusion. Bursae: Prominent fluid in the left greater than right greater trochanteric bursa. Muscles and tendons Muscles and tendons: The visualized gluteus, hamstring  and iliopsoas tendons appear normal. Edema within the bilateral gluteal muscle surrounding the greater trochanteric bursa. Other findings Miscellaneous: The visualized internal pelvic contents appear unremarkable. 7.8 cm gas and fluid collection the subcutaneous fat overlying the lateral left hip with surrounding soft tissue swelling. IMPRESSION: 1. 7.8 cm gas and fluid collection in the subcutaneous fat overlying the lateral left hip with surrounding soft tissue swelling, consistent with abscess. 2. Significant left and moderate right greater trochanteric bursitis. Given the proximity of the left greater trochanteric bursa to the overlying soft tissue abscess, septic bursitis is not excluded. 3. No evidence of osteomyelitis. 4. Right superior labral tear. Electronically Signed   By: Titus Dubin M.D.   On: 06/29/2019 07:46   [place] EKG SITE RITE  Result Date: 06/29/2019 If Site Rite image not attached, placement could not be confirmed due to current cardiac rhythm.  IR Diskography Lumbar  Result Date: 06/30/2019 INDICATION: Severe low back pain secondary to lumbar discitis at L1-L2. EXAM: FLUORO GUIDED DISC ASPIRATION AT L1-L2 MEDICATIONS: The patient is currently admitted to the hospital and receiving intravenous  antibiotics. The antibiotics were administered within an appropriate time frame prior to the initiation of the procedure. ANESTHESIA/SEDATION: Fentanyl 75 mcg IV; Versed 1 mg IV Moderate Sedation Time:  15 The patient was continuously monitored during the procedure by the interventional radiology nurse under my direct supervision. COMPLICATIONS: None immediate. PROCEDURE: Informed written consent was obtained from the patient after a thorough discussion of the procedural risks, benefits and alternatives. All questions were addressed. Maximal Sterile Barrier Technique was utilized including caps, mask, sterile gowns, sterile gloves, sterile drape, hand hygiene and skin antiseptic. A timeout was performed prior to the initiation of the procedure. Patient was laid prone on the fluoroscopic table. The skin overlying the lumbar region was then prepped and draped in the usual sterile fashion. The skin entry site in the right paraspinous soft tissue at the level of L1-L2 was then infiltrated with 0.25% bupivacaine and carried into the musculature. At biplane intermittent fluoroscopy, a 21 gauge Franseen needle was then advanced into the L1-L2 disc space without any difficulty. Using a 20 mL syringe, fresh bloody aspirate was obtained. A second pass was then made using the above technique this time at a different portion of the disc space. Again using a 20 mL syringe, thick bloody aspirate was obtained. The combined aspirate volume was approximately 2 mL. This was sent for microbiologic analysis as per request of the referring MD. Hemostasis was achieved at the skin entry site. The patient tolerated the procedure well. IMPRESSION: Status post fluoroscopic guided disc aspiration at L1-L2 with 2 passes for a total volume of approximately 2 mL. Electronically Signed   By: Luanne Bras M.D.   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  He was eventually admitted  to the hospital on 05/18/2019 and found to have Covid infection.  Admission notes also indicate that they thought he had new abdominal pain.  He was treated for community-acquired pneumonia and a UTI in addition to receiving 4 days of remdesivir and steroids before leaving [place].  He continued to have delirium and severe back pain after discharge and laid on the floor for 4 days straight prior to being readmitted on 05/26/2019.  New left hip pressure wound.  He was treated empirically for possible sepsis and received 3 days of vancomycin, cefepime and metronidazole.  He was discharged on 06/09/2019.  Has not been able to walk more than 10 steps since he first got sick because of severe pain.  On 06/22/2019 which showed evidence of discitis and osteomyelitis at the L1-2 level with a 1 cm left psoas abscess.  He also had some early, increased signal at T10-11, L2-3 and L5-S  #1 Lumbar diskitis and osteomyelitis with left psoas muscle abscess  Follow-up culture data and continue current antimicrobials, anticipate 6 to 8 weeks of therapy   #2 hip deep pressure wound: He needs surgicaldebridement of this abscess   Orthopedics do not think bursa involved and recommending plastic surgery  Would send deep specimens from or for cultures     LOS: 5 days   Alcide Evener 06/30/2019, 1:49 PM

## 2019-06-30 NOTE — TOC Progression Note (Addendum)
Transition of Care [facility]) - Progression Note    Patient Details  Name: LANNIS HEIMBUCH MRN: [id number] Date of Birth: 14-Sep-1945  [facility] CM/SW Contact  Jacalyn Lefevre Edson Snowball, RN Phone Number: 06/30/2019, 1:21 PM  Clinical Narrative:    1500 Patient decided on [facility]. Called spoke to [facility], unable to accept due to insurance. Patient aware and second choice is [facility] to patient again today. Patient does want to go home at discharge , he does not want to go to a SNF. Patient from home with wife. 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   Paged MD will need orders.   Pam with [facility] following.   Patient has Medicare.gov home health list provided yesterday, he has not decided on home health agency as of yet.   Patient will need walker and 3 in 1 for home.   Patient has [facility] direct phone number to call once he has decided on agency.      Expected Discharge Plan and Services       Post Acute Care Choice: Home Health, Durable Medical Equipment Living arrangements for the past 2 months: Single Family Home                                       Social Determinants of Health (SDOH) Interventions    Readmission Risk Interventions No flowsheet data found.
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
## 2019-06-30 NOTE — Progress Notes (Signed)
Nutrition Follow-up  DOCUMENTATION CODES:   Not applicable  INTERVENTION:   -Continue Ensure Enlive po TID, each supplement provides 350 kcal and 20 grams of protein -Continue MVI with minerals daily  NUTRITION DIAGNOSIS:   Increased nutrient needs related to wound healing as evidenced by estimated needs.  Ongoing  GOAL:   Patient will meet greater than or equal to 90% of their needs  Progressing   MONITOR:   PO intake, Supplement acceptance, Diet advancement, Labs, Weight trends, Skin, I & O's  REASON FOR ASSESSMENT:   Consult Assessment of nutrition requirement/status  ASSESSMENT:   Ivan [name] is a 74 y.o. male presenting to the ED at direction of PCP due to increase inflammatory markers and concerning lumbar MRI with possible discitis/osteomyelitis/psoas abscess. PMH is significant for DDD, CAD, HTN, HLD, BPH, recent COVID+, DM, diet controlled  2/15- s/p S/P L1-L2 flouro guided disc aspiration x 2 passes (2 ml of  thick aspirate obtained)  Reviewed I/O's: -544 ml x 24 hours and -3.4 L since admission  UOP: 1.3 L x 24 hours  Pt in with MD at time of visit.   Per orthopedics notes, plan for plastic surgery consult for further recommendations and management.   Pt remains with good appetite; noted meal completion 50-100%. Pt is consuming Ensure supplements per MAR.   Labs reviewed.   Diet Order:   Diet Order            Diet NPO time specified  Diet effective midnight              EDUCATION NEEDS:   No education needs have been identified at this time  Skin:  Skin Assessment: Skin Integrity Issues: Skin Integrity Issues:: Incisions, DTI, Unstageable DTI: lt heel Unstageable: lt hip Incisions: mid back  Last BM:  06/28/19  Height:   Ht Readings from Last 1 Encounters:  06/25/19 5\' 10"  (1.778 m)    Weight:   Wt Readings from Last 1 Encounters:  06/26/19 79.2 kg    Ideal Body Weight:  75.5 kg  BMI:  Body mass index is 25.05  kg/m.  Estimated Nutritional Needs:   Kcal:  2400-2600  Protein:  125-150 grams  Fluid:  > 2.4 L    Loistine Chance, RD, LDN, [place] Registered Dietitian II Certified Diabetes Care and Education Specialist Please refer to [facility] for RD and/or RD on-call/weekend/after hours pager

## 2019-06-30 NOTE — Progress Notes (Addendum)
Family Medicine Teaching Service Daily Progress Note Intern Pager: [phone]  Patient name: Ivan [name] Medical record number: [id number] Date of birth: 21-Mar-1946 Age: 74 y.o. Gender: male  Primary Care Provider: Sherald Hess., MD Consultants: IR, ID Code Status: Full  Pt Overview and Major Events to Date:  2/13 - admitted  Assessment and Plan: Ivan [name] a 74 y.o.malepresenting to the ED at direction of PCP due to increase inflammatory markers and concerning lumbar MRI with possible discitis/osteomyelitis/psoas abscess. PMH is significant forDDD, CAD, HTN, HLD, BPH, recent COVID+, DM, diet controlled.   MRI c/f Lumbar discitis/osteomyelitis and psoas abscess  Hx of DDD Continues to have intermittent back discomfort. Lumbar MRI(06/22/2019) w/ findings concerning for discitis-osteomyelitis at L1-2 with a possible 1 cm left psoas abscess.s/p IR aspiration of thick fluid form L 1-2 space. Started on CTX and Vanc. MRI left hip 7.8 cm gas and fluid collection in the subcutaneous fat overlying the lateral left hip with surrounding soft tissue swelling, consistent with abscess. -Consult to infectious disease, appreciate recommendations: Start PICC line -Consult plastics for wound care. -Continue CTX (2/15-) Vanc (2/15-) -Consult to interventional radiology, appreciate recommendations -Consult to wound care appreciate recommendations -Consult to PT/OT eval and treat -Follow-up aspirate cultures -Pain management: -ContinueTylenol 1000mg  q6h -OxyIR 5 mg every 4 hours as needed for breakthrough pain -baclofen 5mg  TIDfor muscle spasms  - lidocaine patch q24hr  - mag oxide 400mg  PRN for muscle cramps   BPHStable.  Currently having issues attempting to urinate while laying down. Home medications areFinasteride 5mg andTamsulosin 0.8mg .  -continue home medications  Pressure Ulcers Has aknownpressure ulcer on left  hip(healing, stage 3-4)with some sloughing open to airw/ no streaking/purulence or noted cellulitis surrounding.Other pressure ulcer on left heel with a closed wound. Likely due to decrease in mobility over the last month. -Consult to plastic for wound care, appreciate recommendations -Wound care recommendations:  - left heel: foam dressing, change q3d.   - left hip: BID saline moistened gauze and ABD pads to promote  debridement.   CAD/Hx of MI EKG NSR. No chest pain or shortness of breath currently.  -Continue home ASA 81mg    HLD Last lipid panel 02/20/2018 Tot chol 150, LDL 73, HDL 47, triglycerides 107. 05/26/2019 triglycerides 112. Home medications areCrestor 10mg . -Continue home medications   HTN Normotensive since admission. Home medication areAmlodipine 10mg andLisinopril 40mg . -Continue home medications -monitor w/ vital signs per protocol  Hyperkalemia. Resolved. - 5.2 on 2/13. Repeat 4.3>4.6>4.5>3.8>4.0  RecentCOVIDinfection Tested positive 05/18/2019. Has since tested negative 06/02/2019. Does not have any respiratory symptoms with this admission but is coming from a skilled nursing facility. covid negative on 2/13.   Depression w/ anxiety Home medicationCymbalta 30mg . Continue home medication.  Diet controlled DM Last A1c 05/20/2019. Glu on admission BMP 154. Glu this morning 115.  -monitor on BMP  FEN/GI: Regular diet -miralax PRN, senna PRN  PPx: Lovenox   Disposition: Refuses SNF, home with home health PT/OT when medically stable.   Subjective:  He would like the sodium in his diet to be increased to home requirement.    Objective: Temp:  [97.5 F (36.4 C)-98.4 F (36.9 C)] 97.5 F (36.4 C) (02/17 0731) Pulse Rate:  [73-100] 73 (02/17 0731) Resp:  [14-19] 17 (02/17 0731) BP: (100-113)/(64-71) 109/71 (02/17 0731) SpO2:  [94 %-98 %] 98 % (02/17 0731)   Physical Exam:  General: Appears well, no acute distress. Age appropriate. Lying supine  in bed. Cardiac: RRR, normal heart sounds, no murmurs Respiratory:  CTAB, normal effort Extremities: No edema or cyanosis. Skin: Warm and dry, right hip and right heel ulcer covered with dry clean dressings Neuro: alert and oriented, no focal deficits  Laboratory: Recent Labs  Lab 06/28/19 0931 06/29/19 0631 06/30/19 0322  WBC 8.6 9.4 9.9  HGB 13.5 13.2 12.9*  HCT 40.7 39.7 38.9*  PLT 380 369 375   Recent Labs  Lab 06/28/19 0931 06/29/19 0631 06/30/19 0322  NA 138 135 137  K 4.5 3.8 4.0  CL 101 99 98  CO2 27 26 27   BUN 19 17 19   CREATININE 0.61 0.60* 0.71  CALCIUM 9.0 8.8* 8.9  GLUCOSE 104* 115* 121*   Aerobic/Anaerobic Culture (surgical/deep wound) TO:[id number] Collected: 06/28/19 1436  Specimen: Tissue from PATH Disc Updated: 06/30/19 1147   Specimen Description WOUND   Special Requests L1 L2 DISC ASPIRATE   Gram Stain --   MODERATE WBC PRESENT,BOTH PMN AND MONONUCLEAR  NO ORGANISMS SEEN    Culture --   NO GROWTH 2 DAYS NO ANAEROBES ISOLATED; CULTURE IN PROGRESS FOR 5 DAYS  Performed at [facility], [place] [address], [place], [place] [address]    Report Status PENDING    Imaging/Diagnostic Tests: FLUORO GUIDED DISC ASPIRATION AT L1-L2 IMPRESSION: Status post fluoroscopic guided disc aspiration at L1-L2 with 2 passes for a total volume of approximately 2 mL.   Ivan Fee, DO 06/30/2019, 8:23 AM PGY-1, [place] Intern pager: [phone], text pages welcome
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
## 2019-07-01 ENCOUNTER — Inpatient Hospital Stay (HOSPITAL_COMMUNITY): Payer: Medicare HMO | Admitting: Critical Care Medicine

## 2019-07-01 ENCOUNTER — Encounter (HOSPITAL_COMMUNITY)
Admission: EM | Disposition: A | Discharge status: Home Health | Payer: Self-pay | Source: Skilled Nursing Facility | Attending: Family Medicine

## 2019-07-01 ENCOUNTER — Encounter (HOSPITAL_COMMUNITY): Payer: Self-pay | Admitting: Family Medicine

## 2019-07-01 DIAGNOSIS — K6812 Psoas muscle abscess: Secondary | ICD-10-CM

## 2019-07-01 DIAGNOSIS — M869 Osteomyelitis, unspecified: Secondary | ICD-10-CM

## 2019-07-01 DIAGNOSIS — S71102A Unspecified open wound, left thigh, initial encounter: Secondary | ICD-10-CM

## 2019-07-01 DIAGNOSIS — M5136 Other intervertebral disc degeneration, lumbar region: Secondary | ICD-10-CM

## 2019-07-01 DIAGNOSIS — L8995 Pressure ulcer of unspecified site, unstageable: Secondary | ICD-10-CM

## 2019-07-01 DIAGNOSIS — S71002A Unspecified open wound, left hip, initial encounter: Secondary | ICD-10-CM

## 2019-07-01 DIAGNOSIS — Z951 Presence of aortocoronary bypass graft: Secondary | ICD-10-CM

## 2019-07-01 HISTORY — PX: APPLICATION OF A-CELL OF EXTREMITY: SHX6303

## 2019-07-01 HISTORY — PX: I & D EXTREMITY: SHX5045

## 2019-07-01 HISTORY — PX: APPLICATION OF WOUND VAC: SHX5189

## 2019-07-01 LAB — BASIC METABOLIC PANEL
Anion gap: 9 (ref 5–15)
BUN: 15 mg/dL (ref 8–23)
CO2: 27 mmol/L (ref 22–32)
Calcium: 8.8 mg/dL — ABNORMAL LOW (ref 8.9–10.3)
Chloride: 99 mmol/L (ref 98–111)
Creatinine, Ser: 0.49 mg/dL — ABNORMAL LOW (ref 0.61–1.24)
GFR calc Af Amer: >60 mL/min (ref >60)
GFR calc non Af Amer: >60 mL/min (ref >60)
Glucose, Bld: 95 mg/dL (ref 70–99)
Potassium: 4.1 mmol/L (ref 3.5–5.1)
Sodium: 135 mmol/L (ref 135–145)

## 2019-07-01 LAB — CBC
HCT: 37.4 % — ABNORMAL LOW (ref 39.0–52.0)
Hemoglobin: 12.3 g/dL — ABNORMAL LOW (ref 13.0–17.0)
MCH: 30.8 pg (ref 26.0–34.0)
MCHC: 32.9 g/dL (ref 30.0–36.0)
MCV: 93.7 fL (ref 80.0–100.0)
Platelets: 362 10*3/uL (ref 150–400)
RBC: 3.99 MIL/uL — ABNORMAL LOW (ref 4.22–5.81)
RDW: 13 % (ref 11.5–15.5)
WBC: 9.7 10*3/uL (ref 4.0–10.5)
nRBC: 0 % (ref 0.0–0.2)

## 2019-07-01 LAB — SURGICAL PCR SCREEN
MRSA, PCR: NEGATIVE
Staphylococcus aureus: NEGATIVE

## 2019-07-01 LAB — VANCOMYCIN, TROUGH: Vancomycin Tr: 12 ug/mL — ABNORMAL LOW (ref 15–20)

## 2019-07-01 SURGERY — IRRIGATION AND DEBRIDEMENT EXTREMITY
Anesthesia: General | Site: Hip | Laterality: Left

## 2019-07-01 MED ORDER — SODIUM CHLORIDE 0.9 % IV SOLN
INTRAVENOUS | Status: DC | PRN
  Administered 2019-07-01: 500 mL

## 2019-07-01 MED ORDER — MIDAZOLAM HCL 2 MG/2ML IJ SOLN
INTRAMUSCULAR | Status: AC
  Filled 2019-07-01: qty 2

## 2019-07-01 MED ORDER — ONDANSETRON HCL 4 MG/2ML IJ SOLN
INTRAMUSCULAR | Status: DC | PRN
  Administered 2019-07-01: 4 mg via INTRAVENOUS

## 2019-07-01 MED ORDER — PHENYLEPHRINE HCL (PRESSORS) 10 MG/ML IV SOLN
INTRAVENOUS | Status: DC | PRN
  Administered 2019-07-01: 80 ug via INTRAVENOUS
  Administered 2019-07-01: 40 ug via INTRAVENOUS
  Administered 2019-07-01: 80 ug via INTRAVENOUS
  Administered 2019-07-01: 40 ug via INTRAVENOUS
  Administered 2019-07-01: 80 ug via INTRAVENOUS
  Administered 2019-07-01: 200 ug via INTRAVENOUS

## 2019-07-01 MED ORDER — 0.9 % SODIUM CHLORIDE (POUR BTL) OPTIME
TOPICAL | Status: DC | PRN
  Administered 2019-07-01: 1000 mL

## 2019-07-01 MED ORDER — LIDOCAINE-EPINEPHRINE 1 %-1:100000 IJ SOLN
INTRAMUSCULAR | Status: AC
  Filled 2019-07-01: qty 1

## 2019-07-01 MED ORDER — LIDOCAINE-EPINEPHRINE 1 %-1:100000 IJ SOLN
INTRAMUSCULAR | Status: DC | PRN
  Administered 2019-07-01: 7 mL

## 2019-07-01 MED ORDER — LIDOCAINE HCL (PF) 1 % IJ SOLN
INTRAMUSCULAR | Status: AC
  Filled 2019-07-01: qty 30

## 2019-07-01 MED ORDER — SODIUM CHLORIDE 0.9 % IV SOLN
INTRAVENOUS | Status: AC
  Filled 2019-07-01: qty 500000

## 2019-07-01 MED ORDER — FENTANYL CITRATE (PF) 250 MCG/5ML IJ SOLN
INTRAMUSCULAR | Status: AC
  Filled 2019-07-01: qty 5

## 2019-07-01 MED ORDER — MIDAZOLAM HCL 5 MG/5ML IJ SOLN
INTRAMUSCULAR | Status: DC | PRN
  Administered 2019-07-01: 2 mg via INTRAVENOUS

## 2019-07-01 MED ORDER — PROPOFOL 10 MG/ML IV BOLUS
INTRAVENOUS | Status: DC | PRN
  Administered 2019-07-01: 110 mg via INTRAVENOUS

## 2019-07-01 MED ORDER — DEXAMETHASONE SODIUM PHOSPHATE 10 MG/ML IJ SOLN
INTRAMUSCULAR | Status: DC | PRN
  Administered 2019-07-01: 4 mg via INTRAVENOUS

## 2019-07-01 MED ORDER — PHENYLEPHRINE HCL-NACL 10-0.9 MG/250ML-% IV SOLN
INTRAVENOUS | Status: DC | PRN
  Administered 2019-07-01: 50 ug/min via INTRAVENOUS

## 2019-07-01 MED ORDER — PROPOFOL 10 MG/ML IV BOLUS
INTRAVENOUS | Status: AC
  Filled 2019-07-01: qty 20

## 2019-07-01 MED ORDER — SODIUM CHLORIDE 0.9 % IR SOLN
Status: DC | PRN
  Administered 2019-07-01: 3000 mL

## 2019-07-01 MED ORDER — LACTATED RINGERS IV SOLN: INTRAVENOUS | Status: DC | PRN

## 2019-07-01 MED ORDER — FENTANYL CITRATE (PF) 250 MCG/5ML IJ SOLN
INTRAMUSCULAR | Status: DC | PRN
  Administered 2019-07-01: 25 ug via INTRAVENOUS
  Administered 2019-07-01: 50 ug via INTRAVENOUS
  Administered 2019-07-01: 25 ug via INTRAVENOUS

## 2019-07-01 MED ORDER — BUPIVACAINE HCL (PF) 0.25 % IJ SOLN
INTRAMUSCULAR | Status: AC
  Filled 2019-07-01: qty 30

## 2019-07-01 SURGICAL SUPPLY — 55 items
APL PRP STRL LF DISP 70% ISPRP (MISCELLANEOUS)
BLADE CLIPPER SURG (BLADE) IMPLANT
BNDG ELASTIC 4X5.8 VLCR STR LF (GAUZE/BANDAGES/DRESSINGS) IMPLANT
BNDG GAUZE ELAST 4 BULKY (GAUZE/BANDAGES/DRESSINGS) IMPLANT
CANISTER SUCT 3000ML PPV (MISCELLANEOUS) ×1 IMPLANT
CANISTER WOUND CARE 500ML ATS (WOUND CARE) ×3 IMPLANT
CHLORAPREP W/TINT 26 (MISCELLANEOUS) IMPLANT
CONT SPEC 4OZ CLIKSEAL STRL BL (MISCELLANEOUS) ×2 IMPLANT
COVER SURGICAL LIGHT HANDLE (MISCELLANEOUS) ×3 IMPLANT
COVER WAND RF STERILE (DRAPES) ×3 IMPLANT
DRAPE HALF SHEET 40X57 (DRAPES) IMPLANT
DRAPE INCISE IOBAN 66X45 STRL (DRAPES) IMPLANT
DRAPE ORTHO SPLIT 77X108 STRL (DRAPES)
DRAPE SURG ORHT 6 SPLT 77X108 (DRAPES) IMPLANT
DRESSING HYDROCOLLOID 4X4 (GAUZE/BANDAGES/DRESSINGS) ×3 IMPLANT
DRSG ADAPTIC 3X8 NADH LF (GAUZE/BANDAGES/DRESSINGS) IMPLANT
DRSG CUTIMED SORBACT 7X9 (GAUZE/BANDAGES/DRESSINGS) ×3 IMPLANT
DRSG PAD ABDOMINAL 8X10 ST (GAUZE/BANDAGES/DRESSINGS) IMPLANT
DRSG VAC ATS LRG SENSATRAC (GAUZE/BANDAGES/DRESSINGS) IMPLANT
DRSG VAC ATS MED SENSATRAC (GAUZE/BANDAGES/DRESSINGS) IMPLANT
DRSG VAC ATS SM SENSATRAC (GAUZE/BANDAGES/DRESSINGS) ×2 IMPLANT
ELECT CAUTERY BLADE 6.4 (BLADE) ×1 IMPLANT
ELECT REM PT RETURN 9FT ADLT (ELECTROSURGICAL) ×3
ELECTRODE REM PT RTRN 9FT ADLT (ELECTROSURGICAL) ×1 IMPLANT
GAUZE SPONGE 4X4 12PLY STRL (GAUZE/BANDAGES/DRESSINGS) ×1 IMPLANT
GEL ULTRASOUND 20GR AQUASONIC (MISCELLANEOUS) ×2 IMPLANT
GLOVE BIO SURGEON STRL SZ 6.5 (GLOVE) ×4 IMPLANT
GLOVE BIO SURGEONS STRL SZ 6.5 (GLOVE) ×2
GOWN STRL REUS W/ TWL LRG LVL3 (GOWN DISPOSABLE) ×3 IMPLANT
GOWN STRL REUS W/TWL LRG LVL3 (GOWN DISPOSABLE) ×9
HANDPIECE INTERPULSE COAX TIP (DISPOSABLE) ×3
KIT BASIN OR (CUSTOM PROCEDURE TRAY) ×3 IMPLANT
KIT TURNOVER KIT B (KITS) ×3 IMPLANT
MATRIX WOUND 3-LAYER 5X5 (Tissue) ×1 IMPLANT
MICROMATRIX 1000MG (Tissue) ×3 IMPLANT
NS IRRIG 1000ML POUR BTL (IV SOLUTION) ×3 IMPLANT
PACK GENERAL/GYN (CUSTOM PROCEDURE TRAY) ×3 IMPLANT
PAD ARMBOARD 7.5X6 YLW CONV (MISCELLANEOUS) ×6 IMPLANT
PAD NEG PRESSURE SENSATRAC (MISCELLANEOUS) IMPLANT
SET HNDPC FAN SPRY TIP SCT (DISPOSABLE) IMPLANT
SOLUTION PARTIC MCRMTRX 1000MG (Tissue) IMPLANT
STAPLER VISISTAT 35W (STAPLE) IMPLANT
STOCKINETTE IMPERVIOUS 9X36 MD (GAUZE/BANDAGES/DRESSINGS) IMPLANT
STOCKINETTE IMPERVIOUS LG (DRAPES) IMPLANT
SURGILUBE 2OZ TUBE FLIPTOP (MISCELLANEOUS) ×1 IMPLANT
SUT SILK 4 0 P 3 (SUTURE) IMPLANT
SUT SILK 4 0 PS 2 (SUTURE) IMPLANT
SUT VIC AB 5-0 PS2 18 (SUTURE) ×5 IMPLANT
TOWEL GREEN STERILE (TOWEL DISPOSABLE) ×1 IMPLANT
TOWEL GREEN STERILE FF (TOWEL DISPOSABLE) IMPLANT
TUBE CONNECTING 12'X1/4 (SUCTIONS)
TUBE CONNECTING 12X1/4 (SUCTIONS) ×1 IMPLANT
UNDERPAD 30X30 (UNDERPADS AND DIAPERS) ×2 IMPLANT
WOUND MATRIX 3-LAYER 5X5 (Tissue) ×1 IMPLANT
YANKAUER SUCT BULB TIP NO VENT (SUCTIONS) ×1 IMPLANT

## 2019-07-01 NOTE — Op Note (Addendum)
DATE OF OPERATION: 07/01/2019  LOCATION: [facility]  PREOPERATIVE DIAGNOSIS: Left hip wound  POSTOPERATIVE DIAGNOSIS: Same  PROCEDURE:  1. Excision of left hip wound 6 x 7 x 2 cm skin, soft tissue and fat 2. Acell placement (5 x 5 cm sheet and 1 gm powder) 3. VAC placement  SURGEON: Tyeesha Riker Sanger Tamira Ryland, DO  ASSISTANT: Phoebe Sharps, PA  EBL: 10 cc  CONDITION: Stable  COMPLICATIONS: None  INDICATION: The patient, Ivan [name], is a 74 y.o. male born on 30-Nov-1945, is here for treatment of a severe left hip wound.   PROCEDURE DETAILS:  The patient was seen prior to surgery and marked.  The IV antibiotics were given. The patient was taken to the operating room and given a general anesthetic. A standard time out was performed and all information was confirmed by those in the room. SCDs were placed.   The left hip was prepped and draped.  Local with epinepherine was injected at the periwound area for intraoperative hemostasis and postoperative pain control.  The #10 blade was used to excise the 6 x 7 cm wound. This included skin, soft tissue and fat.  The bovie was used to obtain hemostasis.  Sample / tissue sent for micro.  The wound was irrigated with saline. All of the Acell powder and sheet was applied.  The sorbact and Acell was sutured in placed with the 5-0 Vicryl.  The Vac was applied.   The patient was allowed to wake up and taken to recovery room in stable condition at the end of the case. The family was notified at the end of the case.   The advanced practice practitioner (APP) assisted throughout the case.  The APP was essential in retraction and counter traction when needed to make the case progress smoothly.  This retraction and assistance made it possible to see the tissue plans for the procedure.  The assistance was needed for blood control, tissue re-approximation and assisted with closure of the incision site.  The [place] was  signed into law in 2016 which includes the topic of electronic health records.  This provides immediate access to information in MyChart.  This includes consultation notes, operative notes, office notes, lab results and pathology reports.  If you have any questions about what you read please let us know at your next visit or call us at the office.  We are right here with you.

## 2019-07-01 NOTE — Progress Notes (Signed)
Subjective:  No new complaints Antibiotics:  Anti-infectives (From admission, onward)   Start     Dose/Rate Route Frequency Ordered Stop   07/01/19 0811  polymyxin B 500,000 Units, bacitracin 50,000 Units in sodium chloride 0.9 % 500 mL irrigation  Status:  Discontinued       As needed 07/01/19 0811 07/01/19 0831   06/29/19 0500  vancomycin (VANCOCIN) IVPB 1000 mg/200 mL premix     1,000 mg 200 mL/hr over 60 Minutes Intravenous Every 12 hours 06/28/19 1557     06/28/19 1700  vancomycin (VANCOREADY) IVPB 1750 mg/350 mL     1,750 mg 175 mL/hr over 120 Minutes Intravenous  Once 06/28/19 1511 06/28/19 1800   06/28/19 1600  cefTRIAXone (ROCEPHIN) 2 g in sodium chloride 0.9 % 100 mL IVPB     2 g 200 mL/hr over 30 Minutes Intravenous Every 24 hours 06/28/19 1500     06/26/19 1500  cefTRIAXone (ROCEPHIN) 1 g in sodium chloride 0.9 % 100 mL IVPB  Status:  Discontinued     1 g 200 mL/hr over 30 Minutes Intravenous Every 24 hours 06/26/19 1452 06/26/19 1455      Medications: Scheduled Meds: . acetaminophen  1,000 mg Oral Q6H  . amLODipine  10 mg Oral Daily  . aspirin EC  81 mg Oral Daily  . baclofen  5 mg Oral TID  . Chlorhexidine Gluconate Cloth  6 each Topical Daily  . Chlorhexidine Gluconate Cloth  6 each Topical Daily  . DULoxetine  30 mg Oral Daily  . enoxaparin (LOVENOX) injection  40 mg Subcutaneous Q24H  . feeding supplement (ENSURE ENLIVE)  237 mL Oral TID BM  . finasteride  5 mg Oral Daily  . lidocaine  1 patch Transdermal Q24H  . multivitamin with minerals  1 tablet Oral Daily  . rosuvastatin  10 mg Oral Daily  . tamsulosin  0.8 mg Oral Daily   Continuous Infusions: . cefTRIAXone (ROCEPHIN)  IV 2 g (06/30/19 1633)  . vancomycin 1,000 mg (07/01/19 0451)   PRN Meds:.magnesium oxide, oxyCODONE, polyethylene glycol, senna, sodium chloride flush    Objective: Weight change:   Intake/Output Summary (Last 24 hours) at 07/01/2019 1050 Last data filed at  07/01/2019 1007 Gross per 24 hour  Intake 1320 ml  Output 1805 ml  Net -485 ml   Blood pressure 117/68, pulse 81, temperature 97.8 F (36.6 C), temperature source Oral, resp. rate 18, height 5\' 10"  (1.778 m), weight 79.2 kg, SpO2 96 %. Temp:  [97.4 F (36.3 C)-98.1 F (36.7 C)] 97.8 F (36.6 C) (02/18 0917) Pulse Rate:  [79-97] 81 (02/18 0917) Resp:  [12-23] 18 (02/18 0917) BP: (89-118)/(50-72) 117/68 (02/18 0917) SpO2:  [94 %-100 %] 96 % (02/18 0917)  Physical Exam: General: Alert and awake, oriented x3, not in any acute distress. HEENT: anicteric sclera, EOMI CVS regular rate, normal  Chest: , no wheezing, no respiratory distress Abdomen: soft non-distended,  Extremities: deep wound on hip with bandage, wound to left foot Neuro: nonfocal  CBC:    BMET Recent Labs    06/30/19 0322 07/01/19 0357  NA 137 135  K 4.0 4.1  CL 98 99  CO2 27 27  GLUCOSE 121* 95  BUN 19 15  CREATININE 0.71 0.49*  CALCIUM 8.9 8.8*     Liver Panel  No results for input(s): PROT, ALBUMIN, AST, ALT, ALKPHOS, BILITOT, BILIDIR, IBILI in the last 72 hours.     Sedimentation Rate No  results for input(s): ESRSEDRATE in the last 72 hours. C-Reactive Protein No results for input(s): CRP in the last 72 hours.  Micro Results: Recent Results (from the past 720 hour(s))  SARS CORONAVIRUS 2 (TAT 6-24 HRS) Nasopharyngeal Nasopharyngeal Swab     Status: None   Collection Time: 06/02/19 10:18 AM   Specimen: Nasopharyngeal Swab  Result Value Ref Range Status   SARS Coronavirus 2 NEGATIVE NEGATIVE Final    Comment: (NOTE) SARS-CoV-2 target nucleic acids are NOT DETECTED. The SARS-CoV-2 RNA is generally detectable in upper and lower respiratory specimens during the acute phase of infection. Negative results do not preclude SARS-CoV-2 infection, do not rule out co-infections with other pathogens, and should not be used as the sole basis for treatment or other patient management  decisions. Negative results must be combined with clinical observations, patient history, and epidemiological information. The expected result is Negative. Fact Sheet for Patients: SugarRoll.be Fact Sheet for Healthcare Providers: https://www.woods-mathews.com/ This test is not yet approved or cleared by the [place] FDA and  has been authorized for detection and/or diagnosis of SARS-CoV-2 by FDA under an Emergency Use Authorization (EUA). This EUA will remain  in effect (meaning this test can be used) for the duration of the COVID-19 declaration under Section 56 4(b)(1) of the Act, 21 U.S.C. section 360bbb-3(b)(1), unless the authorization is terminated or revoked sooner. Performed at [facility], [place] [address] [place], [place] [address]   Blood culture (routine x 2)     Status: None   Collection Time: 06/25/19  8:55 PM   Specimen: BLOOD RIGHT FOREARM  Result Value Ref Range Status   Specimen Description BLOOD RIGHT FOREARM  Final   Special Requests   Final    BOTTLES DRAWN AEROBIC AND ANAEROBIC Blood Culture adequate volume   Culture   Final    NO GROWTH 5 DAYS Performed at [facility], [place] [address] [place], [place] [address]    Report Status 06/30/2019 FINAL  Final  Blood culture (routine x 2)     Status: None   Collection Time: 06/25/19  9:02 PM   Specimen: BLOOD RIGHT FOREARM  Result Value Ref Range Status   Specimen Description BLOOD RIGHT FOREARM  Final   Special Requests   Final    BOTTLES DRAWN AEROBIC AND ANAEROBIC Blood Culture results may not be optimal due to an excessive volume of blood received in culture bottles   Culture   Final    NO GROWTH 5 DAYS Performed at [facility], [place] [address] [place], [place] [address]    Report Status 06/30/2019 FINAL  Final  Urine Culture     Status: None   Collection Time: 06/25/19 10:50 PM   Specimen: Urine, Clean Catch  Result Value Ref Range Status    Specimen Description URINE, CLEAN CATCH  Final   Special Requests NONE  Final   Culture   Final    NO GROWTH Performed at [facility], [place] [address], [place], [place] [address]    Report Status 06/27/2019 FINAL  Final  SARS CORONAVIRUS 2 (TAT 6-24 HRS) Nasopharyngeal Nasopharyngeal Swab     Status: None   Collection Time: 06/25/19 10:55 PM   Specimen: Nasopharyngeal Swab  Result Value Ref Range Status   SARS Coronavirus 2 NEGATIVE NEGATIVE Final    Comment: (NOTE) SARS-CoV-2 target nucleic acids are NOT DETECTED. The SARS-CoV-2 RNA is generally detectable in upper and lower respiratory specimens during the acute  phase of infection. Negative results do not preclude SARS-CoV-2 infection, do not rule out co-infections with other pathogens, and should not be used as the sole basis for treatment or other patient management decisions. Negative results must be combined with clinical observations, patient history, and epidemiological information. The expected result is Negative. Fact Sheet for Patients: SugarRoll.be Fact Sheet for Healthcare Providers: https://www.woods-mathews.com/ This test is not yet approved or cleared by the [place] FDA and  has been authorized for detection and/or diagnosis of SARS-CoV-2 by FDA under an Emergency Use Authorization (EUA). This EUA will remain  in effect (meaning this test can be used) for the duration of the COVID-19 declaration under Section 56 4(b)(1) of the Act, 21 U.S.C. section 360bbb-3(b)(1), unless the authorization is terminated or revoked sooner. Performed at [facility], [place] [address], [place], [place] [address]   Aerobic/Anaerobic Culture (surgical/deep wound)     Status: None (Preliminary result)   Collection Time: 06/28/19  2:36 PM   Specimen: PATH Disc; Tissue  Result Value Ref Range Status   Specimen Description WOUND  Final   Special Requests L1 L2 DISC ASPIRATE   Final   Gram Stain   Final    MODERATE WBC PRESENT,BOTH PMN AND MONONUCLEAR NO ORGANISMS SEEN    Culture   Final    NO GROWTH 3 DAYS NO ANAEROBES ISOLATED; CULTURE IN PROGRESS FOR 5 DAYS Performed at [facility], [address], [place], [place] [address]    Report Status PENDING  Incomplete  Surgical pcr screen     Status: None   Collection Time: 07/01/19  1:49 AM   Specimen: Nasal Mucosa; Nasal Swab  Result Value Ref Range Status   MRSA, PCR NEGATIVE NEGATIVE Final   Staphylococcus aureus NEGATIVE NEGATIVE Final    Comment: (NOTE) The Xpert SA Assay (FDA approved for NASAL specimens in patients 34 years of age and older), is one component of a comprehensive surveillance program. It is not intended to diagnose infection nor to guide or monitor treatment. Performed at [facility], [place] [address] [place], [place] [address]     Studies/Results: [place] EKG SITE RITE  Result Date: 06/29/2019 If [facility] image not attached, placement could not be confirmed due to current cardiac rhythm.     Assessment/Plan:  INTERVAL HISTORY:  sp 1. Excision of left hip wound 6 x 7 cm skin, soft tissue and fat 2. Acell placement (5 x 5 cm sheet and 1 gm powder) 3. 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  He was eventually admitted to the hospital on 05/18/2019 and found to have Covid infection.  Admission notes also indicate that they thought he had new abdominal pain.  He was treated for community-acquired pneumonia and a UTI in addition to receiving 4  days of remdesivir and steroids before leaving [facility].  He continued to have delirium and severe back pain after discharge and laid on the floor for 4 days straight prior to  being readmitted on 05/26/2019.  New left hip pressure wound.  He was treated empirically for possible sepsis and received 3 days of vancomycin, cefepime and metronidazole.  He was discharged on 06/09/2019.  Has not been able to walk more than 10 steps since he first got sick because of severe pain.  On 06/22/2019 which showed evidence of discitis and osteomyelitis at the L1-2 level with a 1 cm left psoas abscess.  He also had some early, increased signal at T10-11, L2-3 and L5-S  #1 Lumbar diskitis and osteomyelitis with left psoas muscle abscess  Follow-up culture data and continue current antimicrobials, anticipate  8 weeks of therapy   #2 hip deep pressure wound: xsp  1. Excision of left hip wound 6 x 7 cm skin, soft tissue and fat 2. Acell placement (5 x 5 cm sheet and 1 gm powder) 3. VAC placement  Holter sent.  I will put order in for PICC line if not already done as well as O PAT order with plan for IV vancomycin and ceftriaxone unless new cultures are revealing     LOS: 6 days   Alcide Evener 07/01/2019, 10:50 AM

## 2019-07-01 NOTE — Progress Notes (Signed)
OT Cancellation    07/01/19 0842  OT Visit Information  Last OT Received On 07/01/19  Reason Eval/Treat Not Completed Patient at procedure or test/ unavailable

## 2019-07-01 NOTE — Transfer of Care (Signed)
Immediate Anesthesia Transfer of Care Note  Patient: Ivan [name]  Procedure(s) Performed: IRRIGATION AND DEBRIDEMENT LEFT HIP WOUND (Left Hip) APPLICATION OF WOUND VAC (Left Hip) APPLICATION OF A-CELL LEFT HIP (Left Hip)  Patient Location: PACU  Anesthesia Type:General  Level of Consciousness: sedated  Airway & Oxygen Therapy: Patient Spontanous Breathing and Patient connected to nasal cannula oxygen  Post-op Assessment: Report given to RN and Post -op Vital signs reviewed and stable  Post vital signs: Reviewed and stable  Last Vitals:  Vitals Value Taken Time  BP 96/65 07/01/19 0824  Temp    Pulse 82 07/01/19 0825  Resp 18 07/01/19 0825  SpO2 100 % 07/01/19 0825  Vitals shown include unvalidated device data.  Last Pain:  Vitals:   07/01/19 0428  TempSrc: Oral  PainSc:          Complications: No apparent anesthesia complications

## 2019-07-01 NOTE — Progress Notes (Signed)
Family Medicine Teaching Service Daily Progress Note Intern Pager: [phone]  Patient name: Ivan [name] Medical record number: [id number] Date of birth: 07-14-1945 Age: 74 y.o. Gender: male  Primary Care Provider: Sherald Hess., MD Consultants: IR, ID Code Status: Full  Pt Overview and Major Events to Date:  2/13 - admitted  Assessment and Plan: Ivan Boice Newsomis a 74 y.o.malepresenting to the ED at direction of PCP due to increase inflammatory markers and concerning lumbar MRI with possible discitis/osteomyelitis/psoas abscess. PMH is significant forDDD, CAD, HTN, HLD, BPH, recent COVID+, DM, diet controlled.   MRI c/f Lumbar discitis/osteomyelitis and psoas abscess  Hx of DDD Lumbar MRI(06/22/2019) w/ findings concerning for discitis-osteomyelitis at L1-2 with a possible 1 cm left psoas abscess.s/p IR aspiration of thick fluid form L 1-2 space 2/15 Culture NG for >48hrs.  MRI left hip 7.8 cm gas and fluid collection in the subcutaneous fat overlying the lateral left hip with surrounding soft tissue swelling, consistent with abscess. Left hip wound debridment 2/18 w/ plastics. Cultures to be obtained. -Consult to infectious disease, appreciate recommendations -Consult plastics for wound care. -Continue CTX (2/15-) Vanc (2/15-) -Consult to interventional radiology, appreciate recommendations -Consult to wound care appreciate recommendations -Consult to PT/OT eval and treat -Follow-up aspirate and wound cultures -Pain management: -ContinueTylenol 1000mg  q6h -OxyIR 5 mg every 4 hours as needed for breakthrough pain -baclofen 5mg  TIDfor muscle spasms  - lidocaine patch q24hr  - mag oxide 400mg  PRN for muscle cramps   BPHStable.  Continues to have issues with starting a strea. UOP 1.4L Home medications areFinasteride 5mg andTamsulosin 0.8mg . max doses for each. -continue home medications -bladder scan to obtain post residual  measurement  Pressure Ulcers Has aknownpressure ulcer on left hip(healing, stage 3-4)with some sloughing open to airw/ no streaking/purulence or noted cellulitis surrounding.Other pressure ulcer on left heel with a closed wound. Likely due to decrease in mobility over the last month.  -Consult to plastic for wound care, appreciate recommendations: Surgical debridement and cultures 2/18. -Wound care recommendations:  - left heel: foam dressing, change q3d.   - left hip: BID saline moistened gauze and ABD pads to promote  debridement.   CAD/Hx of MI No chest pain or shortness of breath currently.  -Continue home ASA 81mg    HLD Last lipid panel 02/20/2018 Tot chol 150, LDL 73, HDL 47, triglycerides 107. 05/26/2019 triglycerides 112. Home medications areCrestor 10mg . -Continue home medications   HTN Normotensive since admission. Home medication areAmlodipine 10mg andLisinopril 40mg . -Continue home medications -monitor w/ vital signs per protocol  Hyperkalemia. Resolved. - 5.2 on 2/13. Repeat 4.1  RecentCOVIDinfection Tested positive 05/18/2019. Has since tested negative 06/02/2019. Does not have any respiratory symptoms with this admission but is coming from a skilled nursing facility. covid negative on 2/13.   Depression w/ anxiety Home medicationCymbalta 30mg . Continue home medication.  Diet controlled DM Last A1c 05/20/2019. Glu on admission BMP 154. Glu this morning 115.  -monitor on BMP  FEN/GI: Regular diet -miralax PRN, senna PRN  PPx: Lovenox   Disposition: Refuses SNF, home with home health PT/OT when medically stable.   Subjective:  Is having trouble with starting his urine stream when attempting to use the restroom. Would like to know if his BPH medication can be increased.   Objective: Temp:  [97.4 F (36.3 C)-97.9 F (36.6 C)] 97.4 F (36.3 C) (02/18 0428) Pulse Rate:  [88-97] 88 (02/18 0428) Resp:  [15-18] 16 (02/18 0428) BP: (89-118)/(58-72)  98/60 (02/18 0428) SpO2:  [94 %-97 %]  97 % (02/18 0428)   Physical Exam:  General: Appears well, no acute distress. Age appropriate. Lying in bed.  Cardiac: RRR, normal heart sounds, no murmurs Respiratory: CTAB, normal effort Extremities: No edema or cyanosis. Skin: Warm and dry, no rashes noted. Left wound with clean wound vac intact.  Neuro: alert and oriented, CN II-XII grossly intact,no focal deficits. Upper ext. 5/5 Lower ext. 4/5. U+L DTR 2+ Psych: flat affect   Laboratory: Recent Labs  Lab 06/29/19 0631 06/30/19 0322 07/01/19 0357  WBC 9.4 9.9 9.7  HGB 13.2 12.9* 12.3*  HCT 39.7 38.9* 37.4*  PLT 369 375 362   Recent Labs  Lab 06/29/19 0631 06/30/19 0322 07/01/19 0357  NA 135 137 135  K 3.8 4.0 4.1  CL 99 98 99  CO2 26 27 27   BUN 17 19 15   CREATININE 0.60* 0.71 0.49*  CALCIUM 8.8* 8.9 8.8*  GLUCOSE 115* 121* 95   Imaging/Diagnostic Tests: No new imaging  Gerlene Fee, DO 07/01/2019, 8:06 AM PGY-1, [place] Intern pager: [phone], text pages welcome

## 2019-07-01 NOTE — Anesthesia Preprocedure Evaluation (Addendum)
Anesthesia Evaluation  Patient identified by MRN, date of birth, ID band Patient awake    Reviewed: Allergy & Precautions, NPO status , Patient's Chart, lab work & pertinent test results  History of Anesthesia Complications Negative for: history of anesthetic complications  Airway Mallampati: III  TM Distance: >3 FB Neck ROM: Full    Dental  (+) Dental Advisory Given   Pulmonary Recent URI , Resolved, former smoker,  Covid pos 05/20/2018   breath sounds clear to auscultation       Cardiovascular hypertension, Pt. on medications + CAD and + Past MI   Rhythm:Regular  - Left ventricle: The cavity size was normal. Wall thickness was  increased in a pattern of moderate LVH. Systolic function was  normal. The estimated ejection fraction was in the range of 60%  to 65%. Moderate hypokinesis of the apicalanteroseptal  myocardium.  - Left atrium: The atrium was mildly dilated.  - Right atrium: The atrium was mildly dilated.    2016 cath: 1. Essentially resolution of 100% stenosis in the right posterior AV groove/PL system with restoration of flow to 3 posterolateral branches residual 30% stenosis noted 2. Persistent proximal RPDA lesion that appears to be more 95-99% stenosis just prior to aneurysmal dilatation. 3. Aborted PCI after onset of severe headache. 4. Possible right a evidence of thrombus in the proximal right carotid. Slight left leg drift on examNeurologic team   Neuro/Psych  Headaches, PSYCHIATRIC DISORDERS Anxiety Depression TIA Neuromuscular disease    GI/Hepatic negative GI ROS, Neg liver ROS,   Endo/Other  diabetes  Renal/GU negative Renal ROS     Musculoskeletal  (+) Arthritis ,   Abdominal   Peds  Hematology negative hematology ROS (+)   Anesthesia Other Findings   Reproductive/Obstetrics                            Anesthesia Physical Anesthesia Plan  ASA:  III  Anesthesia Plan: General   Post-op Pain Management:    Induction: Intravenous  PONV Risk Score and Plan: 2 and Ondansetron and Dexamethasone  Airway Management Planned: LMA and Oral ETT  Additional Equipment: None  Intra-op Plan:   Post-operative Plan: Extubation in OR  Informed Consent: I have reviewed the patients History and Physical, chart, labs and discussed the procedure including the risks, benefits and alternatives for the proposed anesthesia with the patient or authorized representative who has indicated his/her understanding and acceptance.     Dental advisory given  Plan Discussed with: CRNA and Surgeon  Anesthesia Plan Comments:         Anesthesia Quick Evaluation

## 2019-07-01 NOTE — Progress Notes (Signed)
Occupational Therapy Treatment Patient Details Name: Ivan [name] MRN: [id number] DOB: 27-Oct-1945 Today's Date: 07/01/2019    History of present illness Pt is a 74 y/o male admitted secondary to acute on chronic back pain. MRI revealed lumbar discitis/osteomyelitis and psoas abscess. Of note, pt with recent admissions secondary to [place] related AMS with d/c on 1/27 to [facility]. PMH including but not limited to CAD, HTN, HLD, BPH, recent COVID+, DM.   OT comments  Patient continues to make steady progress towards goals in skilled OT session. Patient's session encompassed transfer to [facility] as pt requested this be his goal for the session. Pt at supervision/min guard for bed mobility, however required increased verbal cues for sequencing and safety when at EOB. Pt quite impulsive in session, often attempting to transfer without BSC nearby or without therapist's prompting. Pt attempted 3 sit<>stand transfers (1 LOB noted due to impulsive and quick power up quickly sat back on EOB) and was able to transfer to [employer]. Pt unwilling to sit in chair at end of session, requesting to transfer back to bed. Pt would continue to benefit from OT acutely to promote increased independence as well as reinforce safety in transfers due to impulsivity; will continue to follow.   Follow Up Recommendations  SNF;Supervision/Assistance - 24 hour    Equipment Recommendations  Other (comment)(Defer till next venue of care)    Recommendations for Other Services      Precautions / Restrictions Precautions Precautions: Fall Precaution Comments: severe back pain Restrictions Weight Bearing Restrictions: No       Mobility Bed Mobility Overal bed mobility: Modified Independent Bed Mobility: Rolling;Sidelying to Sit Rolling: Supervision Sidelying to sit: Min guard;Supervision       General bed mobility comments: able to log roll, and and come to sitting with supervision  Transfers Overall  transfer level: Needs assistance Equipment used: Rolling walker (2 wheeled) Transfers: Sit to/from Stand Sit to Stand: Min guard         General transfer comment: Pt able to stand 3x to transfer to [facility], intial stand with LOB back to EOB but with increased cues for safety adherence was able to complete with min gaurd/min A (safety and impulsivity)    Balance Overall balance assessment: Needs assistance Sitting-balance support: No upper extremity supported;Bilateral upper extremity supported;Feet supported Sitting balance-Leahy Scale: Fair Sitting balance - Comments: able to sit EOB without assist   Standing balance support: Bilateral upper extremity supported;During functional activity Standing balance-Leahy Scale: Poor Standing balance comment: bil UE support in standing                           ADL either performed or assessed with clinical judgement   ADL Overall ADL's : Needs assistance/impaired                         Toilet Transfer: Min guard;Minimal assistance;BSC;Stand-pivot Armed forces technical officer Details (indicate cue type and reason): Goal was to get to [facility] in session to date Toileting- Clothing Manipulation and Hygiene: Minimal assistance;Cueing for safety;Cueing for sequencing;Sitting/lateral lean Toileting - Clothing Manipulation Details (indicate cue type and reason): Required min A for thoroughness     Functional mobility during ADLs: Minimal assistance;Rolling walker;Min guard General ADL Comments: pt has limitations due to pain, weakness and impaired cognition     Vision Baseline Vision/History: Wears glasses Wears Glasses: At all times Patient Visual Report: No change from baseline  Perception     Praxis      Cognition Arousal/Alertness: Awake/alert Behavior During Therapy: Impulsive Overall Cognitive Status: Impaired/Different from baseline Area of Impairment: Problem solving;Safety/judgement;Memory                    Current Attention Level: Selective Memory: Decreased short-term memory Following Commands: Follows one step commands inconsistently Safety/Judgement: Decreased awareness of deficits;Decreased awareness of safety   Problem Solving: Difficulty sequencing;Requires verbal cues General Comments: pt required increased cues for safety, quite impulsive during session        Exercises     Shoulder Instructions       General Comments      Pertinent Vitals/ Pain       Pain Assessment: 0-10 Pain Score: 5  Pain Location: low back Pain Descriptors / Indicators: Aching;Guarding;Constant Pain Intervention(s): Limited activity within patient's tolerance;Monitored during session;Repositioned;Patient requesting pain meds-RN notified  Home Living                                          Prior Functioning/Environment              Frequency  Min 2X/week        Progress Toward Goals  OT Goals(current goals can now be found in the care plan section)  Progress towards OT goals: Progressing toward goals  Acute Rehab OT Goals Patient Stated Goal: to get to the [facility] OT Goal Formulation: With patient Time For Goal Achievement: 07/10/19 Potential to Achieve Goals: Good  Plan Discharge plan remains appropriate    Co-evaluation                 AM-PAC OT "6 Clicks" Daily Activity     Outcome Measure   Help from another person eating meals?: None Help from another person taking care of personal grooming?: A Little Help from another person toileting, which includes using toliet, bedpan, or urinal?: A Little Help from another person bathing (including washing, rinsing, drying)?: A Little Help from another person to put on and taking off regular upper body clothing?: A Little Help from another person to put on and taking off regular lower body clothing?: A Lot 6 Click Score: 18    End of Session Equipment Utilized During Treatment: Rolling walker;Gait  belt  OT Visit Diagnosis: Unsteadiness on feet (R26.81);Other abnormalities of gait and mobility (R26.89);Muscle weakness (generalized) (M62.81);Other symptoms and signs involving cognitive function;Pain Pain - part of body: (Back)   Activity Tolerance Patient tolerated treatment well;Patient limited by pain   Patient Left in bed;with call bell/phone within reach;with bed alarm set   Nurse Communication Mobility status;Patient requests pain meds        Time: [id number] OT Time Calculation (min): 20 min  Charges: OT General Charges $OT Visit: 1 Visit OT Treatments $Self Care/Home Management : 8-22 mins  Corinne Ports E. Brandt Chaney, COTA/L Acute Rehabilitation Services [place] 07/01/2019, 12:22 PM

## 2019-07-01 NOTE — Progress Notes (Signed)
Pharmacy Antibiotic Note  Ivan [name] is a 74 y.o. male admitted on 06/25/2019 with increased inflammatory markers and lumbar MRI concerning for possible discitis/osteomyelitis/psoas abscess. Pharmacy has been consulted for vancomycin dosing for osteomyelitis; also started on ceftriaxone.   Patient complains of intermittent back discomfort, and remains afebrile (Tmax 99), WBC 9.7, and Scr 0.49 (CrCl 87.4). Blood and urine cultures are negative, and no growth to date on disc aspiration cultures. It is currently day 4 of therapy. Levels were drawn appropriately and the calculated AUC was 517, which is within the goal AUC of 400-550.    Plan: Continue vancomycin 1000 mg IV q12h. Total duration of 8 weeks per ID consult.  PICC placed and OPAT done 2/18.  Monitor clinical progress, renal function, cultures and sensitivities, and vancomycin levels as clinically indicated.   Height: 5\' 10"  (177.8 cm) Weight: 174 lb 9.7 oz (79.2 kg) IBW/kg (Calculated) : 73  Temp (24hrs), Avg:97.7 F (36.5 C), Min:97.4 F (36.3 C), Max:97.9 F (36.6 C)  Recent Labs  Lab 06/27/19 1120 06/28/19 0931 06/29/19 0631 06/30/19 0322 06/30/19 1827 07/01/19 0357  WBC 10.1 8.6 9.4 9.9  --  9.7  CREATININE 0.68 0.61 0.60* 0.71  --  0.49*  VANCOTROUGH  --   --   --   --   --  12*  VANCOPEAK  --   --   --   --  37  --     Estimated Creatinine Clearance: 87.4 mL/min (A) (by C-G formula based on SCr of 0.49 mg/dL (L)).    Allergies  Allergen Reactions  . Contrast Media [Iodinated Diagnostic Agents] Other (See Comments)    Vascular headache    Antimicrobials this admission: Ceftriaxone 2/15 >>  Vancomycin 2/15 >>  Dose adjustments this admission:   Microbiology results: 2/12 BCx: negative 2/12 UCx: negative 2/15 Disc Aspiration: no growth to date     Thank you for allowing pharmacy to be a part of this patient's care.  Claudina Lick, PharmD Candidate  07/01/2019 6:15 AM

## 2019-07-01 NOTE — Anesthesia Procedure Notes (Signed)
Procedure Name: LMA Insertion Date/Time: 07/01/2019 7:37 AM Performed by: Wilburn Cornelia, CRNA Pre-anesthesia Checklist: Patient identified, Emergency Drugs available, Suction available, Patient being monitored and Timeout performed Patient Re-evaluated:Patient Re-evaluated prior to induction Oxygen Delivery Method: Circle system utilized Preoxygenation: Pre-oxygenation with 100% oxygen Induction Type: IV induction Ventilation: Mask ventilation without difficulty LMA: LMA inserted LMA Size: 5.0 Number of attempts: 1 Placement Confirmation: positive ETCO2,  CO2 detector and breath sounds checked- equal and bilateral Tube secured with: Tape Dental Injury: Teeth and Oropharynx as per pre-operative assessment

## 2019-07-01 NOTE — Progress Notes (Signed)
PHARMACY CONSULT NOTE FOR:  OUTPATIENT  PARENTERAL ANTIBIOTIC THERAPY (OPAT)  Indication: Lumbar discitis Regimen: Vancomycin 1000 mg every 12 hours + Ceftriaxone every 24 hours  End date: 08/20/19  IV antibiotic discharge orders are pended. To discharging provider:  please sign these orders via discharge navigator,  Select New Orders & click on the button choice - Manage This Unsigned Work.     Thank you for allowing pharmacy to be a part of this patient's care.  Jimmy Footman, PharmD, BCPS, BCIDP Infectious Diseases Clinical Pharmacist Phone: [phone] 07/01/2019, 10:56 AM

## 2019-07-01 NOTE — Interval H&P Note (Signed)
History and Physical Interval Note:  07/01/2019 7:26 AM  Ivan [name]  has presented today for surgery, with the diagnosis of LEFT HIP WOUND.  The various methods of treatment have been discussed with the patient and family. After consideration of risks, benefits and other options for treatment, the patient has consented to  Procedure(s): IRRIGATION AND DEBRIDEMENT wound (Left) APPLICATION OF WOUND VAC (Left) APPLICATION OF A-CELL OF EXTREMITY (Left) as a surgical intervention.  The patient's history has been reviewed, patient examined, no change in status, stable for surgery.  I have reviewed the patient's chart and labs.  Questions were answered to the patient's satisfaction.     [name]

## 2019-07-01 NOTE — Plan of Care (Signed)

## 2019-07-02 DIAGNOSIS — I10 Essential (primary) hypertension: Secondary | ICD-10-CM

## 2019-07-02 LAB — BASIC METABOLIC PANEL
Anion gap: 11 (ref 5–15)
BUN: 16 mg/dL (ref 8–23)
CO2: 27 mmol/L (ref 22–32)
Calcium: 9.1 mg/dL (ref 8.9–10.3)
Chloride: 98 mmol/L (ref 98–111)
Creatinine, Ser: 0.57 mg/dL — ABNORMAL LOW (ref 0.61–1.24)
GFR calc Af Amer: >60 mL/min (ref >60)
GFR calc non Af Amer: >60 mL/min (ref >60)
Glucose, Bld: 152 mg/dL — ABNORMAL HIGH (ref 70–99)
Potassium: 4.3 mmol/L (ref 3.5–5.1)
Sodium: 136 mmol/L (ref 135–145)

## 2019-07-02 LAB — CBC
HCT: 36.7 % — ABNORMAL LOW (ref 39.0–52.0)
Hemoglobin: 12 g/dL — ABNORMAL LOW (ref 13.0–17.0)
MCH: 31.1 pg (ref 26.0–34.0)
MCHC: 32.7 g/dL (ref 30.0–36.0)
MCV: 95.1 fL (ref 80.0–100.0)
Platelets: 397 10*3/uL (ref 150–400)
RBC: 3.86 MIL/uL — ABNORMAL LOW (ref 4.22–5.81)
RDW: 13.1 % (ref 11.5–15.5)
WBC: 10.9 10*3/uL — ABNORMAL HIGH (ref 4.0–10.5)
nRBC: 0 % (ref 0.0–0.2)

## 2019-07-02 NOTE — Progress Notes (Addendum)
Subjective:  No new complaints  Antibiotics:  Anti-infectives (From admission, onward)   Start     Dose/Rate Route Frequency Ordered Stop   07/01/19 0811  polymyxin B 500,000 Units, bacitracin 50,000 Units in sodium chloride 0.9 % 500 mL irrigation  Status:  Discontinued       As needed 07/01/19 0811 07/01/19 0831   06/29/19 0500  vancomycin (VANCOCIN) IVPB 1000 mg/200 mL premix     1,000 mg 200 mL/hr over 60 Minutes Intravenous Every 12 hours 06/28/19 1557     06/28/19 1700  vancomycin (VANCOREADY) IVPB 1750 mg/350 mL     1,750 mg 175 mL/hr over 120 Minutes Intravenous  Once 06/28/19 1511 06/28/19 1800   06/28/19 1600  cefTRIAXone (ROCEPHIN) 2 g in sodium chloride 0.9 % 100 mL IVPB     2 g 200 mL/hr over 30 Minutes Intravenous Every 24 hours 06/28/19 1500     06/26/19 1500  cefTRIAXone (ROCEPHIN) 1 g in sodium chloride 0.9 % 100 mL IVPB  Status:  Discontinued     1 g 200 mL/hr over 30 Minutes Intravenous Every 24 hours 06/26/19 1452 06/26/19 1455      Medications: Scheduled Meds: . acetaminophen  1,000 mg Oral Q6H  . amLODipine  10 mg Oral Daily  . aspirin EC  81 mg Oral Daily  . baclofen  5 mg Oral TID  . Chlorhexidine Gluconate Cloth  6 each Topical Daily  . Chlorhexidine Gluconate Cloth  6 each Topical Daily  . DULoxetine  30 mg Oral Daily  . enoxaparin (LOVENOX) injection  40 mg Subcutaneous Q24H  . feeding supplement (ENSURE ENLIVE)  237 mL Oral TID BM  . finasteride  5 mg Oral Daily  . lidocaine  1 patch Transdermal Q24H  . multivitamin with minerals  1 tablet Oral Daily  . rosuvastatin  10 mg Oral Daily  . tamsulosin  0.8 mg Oral Daily   Continuous Infusions: . cefTRIAXone (ROCEPHIN)  IV 2 g (07/01/19 1700)  . vancomycin 1,000 mg (07/02/19 0516)   PRN Meds:.magnesium oxide, oxyCODONE, polyethylene glycol, senna, sodium chloride flush    Objective: Weight change:   Intake/Output Summary (Last 24 hours) at 07/02/2019 1048 Last data filed at  07/02/2019 0521 Gross per 24 hour  Intake 480 ml  Output 1475 ml  Net -995 ml   Blood pressure 114/63, pulse 77, temperature 97.7 F (36.5 C), temperature source Oral, resp. rate 16, height 5\' 10"  (1.778 m), weight 79.2 kg, SpO2 98 %. Temp:  [97.5 F (36.4 C)-98.3 F (36.8 C)] 97.7 F (36.5 C) (02/19 0838) Pulse Rate:  [77-95] 77 (02/19 0838) Resp:  [16-18] 16 (02/19 0838) BP: (106-132)/(56-74) 114/63 (02/19 0942) SpO2:  [94 %-99 %] 98 % (02/19 [id number])  Physical Exam: General: Alert and awake, oriented x3, not in any acute distress. HEENT: anicteric sclera, EOMI CVS regular rate, normal  Chest: , no wheezing, no respiratory distress Abdomen: soft non-distended,  Extremities: deep wound on hip with bandage, wound to left foot Neuro: nonfocal  CBC:    BMET Recent Labs    07/01/19 0357 07/02/19 0240  NA 135 136  K 4.1 4.3  CL 99 98  CO2 27 27  GLUCOSE 95 152*  BUN 15 16  CREATININE 0.49* 0.57*  CALCIUM 8.8* 9.1     Liver Panel  No results for input(s): PROT, ALBUMIN, AST, ALT, ALKPHOS, BILITOT, BILIDIR, IBILI in the last 72 hours.     Sedimentation Rate  No results for input(s): ESRSEDRATE in the last 72 hours. C-Reactive Protein No results for input(s): CRP in the last 72 hours.  Micro Results: Recent Results (from the past 720 hour(s))  Blood culture (routine x 2)     Status: None   Collection Time: 06/25/19  8:55 PM   Specimen: BLOOD RIGHT FOREARM  Result Value Ref Range Status   Specimen Description BLOOD RIGHT FOREARM  Final   Special Requests   Final    BOTTLES DRAWN AEROBIC AND ANAEROBIC Blood Culture adequate volume   Culture   Final    NO GROWTH 5 DAYS Performed at [facility], [address], [place], [place] [address]    Report Status 06/30/2019 FINAL  Final  Blood culture (routine x 2)     Status: None   Collection Time: 06/25/19  9:02 PM   Specimen: BLOOD RIGHT FOREARM  Result Value Ref Range Status   Specimen Description BLOOD  RIGHT FOREARM  Final   Special Requests   Final    BOTTLES DRAWN AEROBIC AND ANAEROBIC Blood Culture results may not be optimal due to an excessive volume of blood received in culture bottles   Culture   Final    NO GROWTH 5 DAYS Performed at [facility], [place] [address] [place], [place] [address]    Report Status 06/30/2019 FINAL  Final  Urine Culture     Status: None   Collection Time: 06/25/19 10:50 PM   Specimen: Urine, Clean Catch  Result Value Ref Range Status   Specimen Description URINE, CLEAN CATCH  Final   Special Requests NONE  Final   Culture   Final    NO GROWTH Performed at [facility], [place] [address], [place], [place] [address]    Report Status 06/27/2019 FINAL  Final  SARS CORONAVIRUS 2 (TAT 6-24 HRS) Nasopharyngeal Nasopharyngeal Swab     Status: None   Collection Time: 06/25/19 10:55 PM   Specimen: Nasopharyngeal Swab  Result Value Ref Range Status   SARS Coronavirus 2 NEGATIVE NEGATIVE Final    Comment: (NOTE) SARS-CoV-2 target nucleic acids are NOT DETECTED. The SARS-CoV-2 RNA is generally detectable in upper and lower respiratory specimens during the acute phase of infection. Negative results do not preclude SARS-CoV-2 infection, do not rule out co-infections with other pathogens, and should not be used as the sole basis for treatment or other patient management decisions. Negative results must be combined with clinical observations, patient history, and epidemiological information. The expected result is Negative. Fact Sheet for Patients: SugarRoll.be Fact Sheet for Healthcare Providers: https://www.woods-mathews.com/ This test is not yet approved or cleared by the [place] FDA and  has been authorized for detection and/or diagnosis of SARS-CoV-2 by FDA under an Emergency Use Authorization (EUA). This EUA will remain  in effect (meaning this test can be used) for the duration of the COVID-19  declaration under Section 56 4(b)(1) of the Act, 21 U.S.C. section 360bbb-3(b)(1), unless the authorization is terminated or revoked sooner. Performed at [facility], [place] [address] [place], [place] [address]   Aerobic/Anaerobic Culture (surgical/deep wound)     Status: None (Preliminary result)   Collection Time: 06/28/19  2:36 PM   Specimen: PATH Disc; Tissue  Result Value Ref Range Status   Specimen Description WOUND  Final   Special Requests L1 L2 DISC ASPIRATE  Final   Gram Stain   Final    MODERATE WBC PRESENT,BOTH PMN AND MONONUCLEAR NO ORGANISMS  SEEN    Culture   Final    NO GROWTH 3 DAYS NO ANAEROBES ISOLATED; CULTURE IN PROGRESS FOR 5 DAYS Performed at [facility], [place] [address] [place], [place] [address]    Report Status PENDING  Incomplete  Surgical pcr screen     Status: None   Collection Time: 07/01/19  1:49 AM   Specimen: Nasal Mucosa; Nasal Swab  Result Value Ref Range Status   MRSA, PCR NEGATIVE NEGATIVE Final   Staphylococcus aureus NEGATIVE NEGATIVE Final    Comment: (NOTE) The Xpert SA Assay (FDA approved for NASAL specimens in patients 79 years of age and older), is one component of a comprehensive surveillance program. It is not intended to diagnose infection nor to guide or monitor treatment. Performed at [facility], [place] [address] [place], [place] [address]   Aerobic/Anaerobic Culture (surgical/deep wound)     Status: None (Preliminary result)   Collection Time: 07/01/19  8:07 AM   Specimen: PATH Soft tissue  Result Value Ref Range Status   Specimen Description WOUND LEFT HIP  Final   Special Requests PATIENT ON FOLLOWING VANCOMYCIN, ROCEPHIN  Final   Gram Stain NO WBC SEEN RARE GRAM POSITIVE COCCI IN PAIRS   Final   Culture   Final    RARE GRAM NEGATIVE RODS CULTURE REINCUBATED FOR BETTER GROWTH Performed at [facility], [place] [address], [place], [place] [address]    Report Status PENDING  Incomplete     Studies/Results: No results found.    Assessment/Plan:  INTERVAL HISTORY:  sp  Cultures from disc aspirate continue be unrevealing his left hip wound had gram-positive cocci in pairs seen on Gram stain and culture is growing some rare gram-negative rods  Principal Problem:   Discitis of lumbar region Active Problems:   CAD (coronary artery disease)   DDD (degenerative disc disease), lumbar   Essential hypertension   Hyperlipidemia   Depression with anxiety   Pressure injury of skin   S/P CABG (coronary artery bypass graft)   BPH (benign prostatic hyperplasia)   History of COVID-19   Osteomyelitis (HCC)   Psoas abscess (HCC)    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  He was eventually admitted to the hospital on 05/18/2019 and found to have Covid infection.  Admission notes also indicate that they thought he had new abdominal pain.  He was treated for community-acquired pneumonia and a UTI in addition to receiving 4 days of remdesivir and steroids before leaving [place].  He continued to have delirium and severe back pain after discharge and laid on the floor for 4 days straight prior to being readmitted on 05/26/2019.  New left hip pressure wound.  He was treated empirically for possible sepsis and received 3 days of vancomycin, cefepime and metronidazole.  He was discharged on 06/09/2019.  Has not been able to walk more than 10 steps since he first got sick because of severe pain.  On 06/22/2019 which showed evidence of discitis and osteomyelitis at the L1-2 level with a 1 cm left psoas abscess.  He also had some early, increased signal at T10-11, L2-3 and L5-S  #1 Lumbar diskitis and osteomyelitis with left psoas muscle abscess  Follow-up culture data and continue current antimicrobials, anticipate  8 weeks of therapy   #2 hip deep pressure wound: xsp  We will follow  up cultures.  My  suspicion is that he developed the hip wound due to it not moving due to his severe discitis and I do not think we can be sure that the microbes in both locations are the same.  I will call the microbiology lab see if they can give [place] more information on the gram-negative rods that are growing  It looks as if he is growing multiple organisms including a streptococcal species along with 3 gram-negative rods.  MALDI will ID several of these later today  ARSHDEEP FRUCHEY has an appointment on 08/02/2019 at 3 AM with Dr. Tommy Medal.  The [facility] is located in the [facility] [place].  [address], which is located to the left of the elevators.  Phone: [phone]  Fax: [phone]  [email or web address]  He should arrive 15 minutes prior to his appointment.       LOS: 7 days   Alcide Evener 07/02/2019, 10:48 AM

## 2019-07-02 NOTE — Progress Notes (Signed)
Physical Therapy Treatment Patient Details Name: Ivan [name] MRN: [id number] DOB: 30-May-1945 Today's Date: 07/02/2019    History of Present Illness Pt is a 74 y/o male admitted secondary to acute on chronic back pain. MRI revealed lumbar discitis/osteomyelitis and psoas abscess. Of note, pt with recent admissions secondary to [place] related AMS with d/c on 1/27 to [facility]F. PMH including but not limited to CAD, HTN, HLD, BPH, recent COVID+, DM.    PT Comments    Pt supine on arrival and agreeable to gait this session. Pt moving well with assist for IV and vAC pt able to transition to sitting and standing without physical assist and walk with guarding. Pt demonstrates some self limiting behavior but reports appreciation for progression of mobility. Pt encouraged to be OOB for meals and all toileting and to increase trials of sitting upright for mobility and ADL progression. Will continue to follow.     Follow Up Recommendations  SNF;Supervision for mobility/OOB     Equipment Recommendations  None recommended by PT    Recommendations for Other Services       Precautions / Restrictions Precautions Precautions: Fall Precaution Comments: VAC    Mobility  Bed Mobility Overal bed mobility: Modified Independent   Rolling: Independent         General bed mobility comments: pt able to transition from supine <>sitting without assist with use of rail and bed flat  Transfers Overall transfer level: Needs assistance   Transfers: Sit to/from Stand;Stand Pivot Transfers Sit to Stand: Min guard Stand pivot transfers: Min guard       General transfer comment: cues for hand placement and safety from bed, recliner and BSC. Stand pivot recliner<>BSC with RW with cues for safety  Ambulation/Gait Ambulation/Gait assistance: Min guard Gait Distance (Feet): 15 Feet Assistive device: Rolling walker (2 wheeled) Gait Pattern/deviations: Step-through pattern;Decreased  stride length;Trunk flexed   Gait velocity interpretation: 1.31 - 2.62 ft/sec, indicative of limited community ambulator General Gait Details: cues for posture and safety with encouragement to maximize distance. Chair followed and pt performed 15' x 2 trials with seated rest between   Stairs             Wheelchair Mobility    Modified Rankin (Stroke Patients Only)       Balance Overall balance assessment: Needs assistance   Sitting balance-Leahy Scale: Good       Standing balance-Leahy Scale: Poor Standing balance comment: single and bil UE support in standing                            Cognition Arousal/Alertness: Awake/alert Behavior During Therapy: WFL for tasks assessed/performed Overall Cognitive Status: Impaired/Different from baseline Area of Impairment: Problem solving;Safety/judgement;Memory                   Current Attention Level: Selective Memory: Decreased short-term memory Following Commands: Follows one step commands consistently Safety/Judgement: Decreased awareness of deficits   Problem Solving: Requires verbal cues        Exercises General Exercises - Lower Extremity Long Arc Quad: AROM;Both;Seated;20 reps Hip ABduction/ADduction: AROM;Both;Seated;20 reps Hip Flexion/Marching: AROM;Both;Seated;20 reps    General Comments        Pertinent Vitals/Pain Pain Score: 4  Pain Location: low back Pain Descriptors / Indicators: Aching;Guarding Pain Intervention(s): Limited activity within patient's tolerance;Monitored during session;Repositioned    Home Living  Prior Function            PT Goals (current goals can now be found in the care plan section) Progress towards PT goals: Progressing toward goals    Frequency    Min 2X/week      PT Plan Current plan remains appropriate    Co-evaluation              AM-PAC PT "6 Clicks" Mobility   Outcome Measure  Help needed  turning from your back to your side while in a flat bed without using bedrails?: None Help needed moving from lying on your back to sitting on the side of a flat bed without using bedrails?: None Help needed moving to and from a bed to a chair (including a wheelchair)?: A Little Help needed standing up from a chair using your arms (e.g., wheelchair or bedside chair)?: A Little Help needed to walk in hospital room?: A Little Help needed climbing 3-5 steps with a railing? : A Lot 6 Click Score: 19    End of Session Equipment Utilized During Treatment: Gait belt Activity Tolerance: Patient tolerated treatment well Patient left: in bed;with call bell/phone within reach;with bed alarm set(pt refused sitting in pain due to report of back spasms develop in sitting) Nurse Communication: Mobility status;Precautions PT Visit Diagnosis: Other abnormalities of gait and mobility (R26.89);Pain     Time: 1010-1046 PT Time Calculation (min) (ACUTE ONLY): 36 min  Charges:  $Gait Training: 8-22 mins $Therapeutic Exercise: 8-22 mins                     Ivan [name] P, PT Acute Rehabilitation Services Pager: [phone] Office: [place] 07/02/2019, 1:29 PM

## 2019-07-02 NOTE — Progress Notes (Signed)
Family Medicine Teaching Service Daily Progress Note Intern Pager: [phone]  Patient name: Ivan [name] Medical record number: [id number] Date of birth: 1945-09-09 Age: 74 y.o. Gender: male  Primary Care Provider: Sherald Hess., MD Consultants: IR, ID Code Status: Full  Pt Overview and Major Events to Date:  2/13 - admitted  Assessment and Plan: Chap Baldivia Newsomis a 74 y.o.malepresenting to the ED at direction of PCP due to increase inflammatory markers and concerning lumbar MRI with possible discitis/osteomyelitis/psoas abscess. PMH is significant forDDD, CAD, HTN, HLD, BPH, recent COVID+, DM, diet controlled.   MRI c/f Lumbar discitis/osteomyelitis and psoas abscess  Hx of DDD s/p IR aspiration of thick fluid form L 1-2 space 2/15 Culture NG for >3days.  MRI left hip 7.8 cm gas and fluid collection in the subcutaneous fat overlying the lateral left hip with surrounding soft tissue swelling, consistent with abscess. Left hip wound debridment 2/18 w/ plastics. Cultures to be obtained. Afebrile. WBC 10.9. -Consult to infectious disease, appreciate recommendations -Consult plastics for wound care. -Continue CTX (2/15-) Vanc (2/15-) PICC -Consult to PT/OT eval and treat -Follow-up aspirate and wound cultures -Pain management: -ContinueTylenol 1000mg  q6h -OxyIR 5 mg every 4 hours as needed for breakthrough pain -baclofen 5mg  TIDfor muscle spasms  - lidocaine patch q24hr  - mag oxide 400mg  PRN for muscle cramps   BPHStable.  No issues today. UOP 2L. Post void bladder scan 105 mL. Home medications areFinasteride 5mg andTamsulosin 0.8mg . max doses for each.  -continue home medications  Pressure Ulcers Has aknownpressure ulcer on left hip(healing, stage 3-4)with some sloughing open to airw/ no streaking/purulence or noted cellulitis surrounding.Other pressure ulcer on left heel with a closed wound. Likely due to decrease in  mobility over the last month.  -Consult to plastic for wound care, appreciate recommendations: Surgical debridement and cultures 2/18. -Wound care recommendations:  - left heel: foam dressing, change q3d.   - left hip: wound vac  CAD/Hx of MI  No chest pain or shortness of breath currently.  -Continue home ASA 81mg    HLD Last lipid panel 02/20/2018 Tot chol 150, LDL 73, HDL 47, triglycerides 107. 05/26/2019 triglycerides 112. Home medications areCrestor 10mg . -Continue home medications   HTN Normotensive since admission. Home medication areAmlodipine 10mg andLisinopril 40mg . -Continue home medications -monitor w/ vital signs per protocol  RecentCOVIDinfection Tested positive 05/18/2019. Has since tested negative 06/02/2019. Does not have any respiratory symptoms with this admission but is coming from a skilled nursing facility. covid negative on 2/13.   Depression w/ anxiety Home medicationCymbalta 30mg . Continue home medication.  Diet controlled DM Last A1c 05/20/2019. Glu on admission BMP 154. Glu this morning 115.  -monitor on BMP  FEN/GI: Regular diet -miralax PRN, senna PRN  PPx: Lovenox   Disposition: Refuses SNF, home with home health PT/OT when medically stable. Likely discharge 2/22.  Subjective:  No concerns a this time. Plans to walk more with PT and become more mobile before discharge.  Objective: Temp:  [97.5 F (36.4 C)-98.3 F (36.8 C)] 97.5 F (36.4 C) (02/19 0357) Pulse Rate:  [79-95] 83 (02/19 0357) Resp:  [12-23] 17 (02/19 0357) BP: (95-132)/(50-74) 132/56 (02/19 0357) SpO2:  [94 %-100 %] 99 % (02/19 0357)   Physical Exam:  General: Appears well, no acute distress. Age appropriate. Cardiac: RRR, normal heart sounds, no murmurs Respiratory: CTAB, normal effort Abdomen: soft, nontender, nondistended Extremities: No edema or cyanosis. Skin: Warm and dry, no rashes noted Neuro: alert and oriented, CN II-XII grossly intact, no focal  deficits. Upper ext. 5/5 Lower ext. 4/5. Psych: flat affect   Laboratory: Recent Labs  Lab 06/30/19 0322 07/01/19 0357 07/02/19 0240  WBC 9.9 9.7 10.9*  HGB 12.9* 12.3* 12.0*  HCT 38.9* 37.4* 36.7*  PLT 375 362 397   Recent Labs  Lab 06/30/19 0322 07/01/19 0357 07/02/19 0240  NA 137 135 136  K 4.0 4.1 4.3  CL 98 99 98  CO2 27 27 27   BUN 19 15 16   CREATININE 0.71 0.49* 0.57*  CALCIUM 8.9 8.8* 9.1  GLUCOSE 121* 95 152*   Imaging/Diagnostic Tests: No new imaging  Gerlene Fee, DO 07/02/2019, 7:46 AM PGY-1, [place] Intern pager: [phone], text pages welcome

## 2019-07-02 NOTE — Anesthesia Postprocedure Evaluation (Signed)
Anesthesia Post Note  Patient: LENNARD TUELL  Procedure(s) Performed: IRRIGATION AND DEBRIDEMENT LEFT HIP WOUND (Left Hip) APPLICATION OF WOUND VAC (Left Hip) APPLICATION OF A-CELL LEFT HIP (Left Hip)     Patient location during evaluation: PACU Anesthesia Type: General Level of consciousness: awake and patient cooperative Pain management: pain level controlled Vital Signs Assessment: post-procedure vital signs reviewed and stable Respiratory status: spontaneous breathing, nonlabored ventilation, respiratory function stable and patient connected to nasal cannula oxygen Cardiovascular status: blood pressure returned to baseline and stable Postop Assessment: no apparent nausea or vomiting Anesthetic complications: no    Last Vitals:  Vitals:   07/02/19 0942 07/02/19 1338  BP: 114/63 125/75  Pulse:  89  Resp:  16  Temp:  36.5 C  SpO2:  99%    Last Pain:  Vitals:   07/02/19 1338  TempSrc: Oral  PainSc:                  Aliya Sol

## 2019-07-02 NOTE — Plan of Care (Signed)

## 2019-07-02 NOTE — TOC Progression Note (Signed)
Transition of Care [facility]) - Progression Note    Patient Details  Name: Ivan [name] MRN: [id number] Date of Birth: [birth year]  [facility]) CM/SW Contact  Jacalyn Lefevre Edson Snowball, RN Phone Number: 07/02/2019, 2:00 PM  Clinical Narrative:     [facility] and [facility] coordinating start of care and asking if discharge date is known . Messaged DR Gerlene Fee, discharge is planned for Monday.        Expected Discharge Plan and Services       Post Acute Care Choice: Home Health, Durable Medical Equipment Living arrangements for the past 2 months: Single Family Home                                       Social Determinants of Health (SDOH) Interventions    Readmission Risk Interventions No flowsheet data found.

## 2019-07-03 DIAGNOSIS — F418 Other specified anxiety disorders: Secondary | ICD-10-CM

## 2019-07-03 DIAGNOSIS — N400 Enlarged prostate without lower urinary tract symptoms: Secondary | ICD-10-CM

## 2019-07-03 LAB — CBC
HCT: 36 % — ABNORMAL LOW (ref 39.0–52.0)
Hemoglobin: 11.7 g/dL — ABNORMAL LOW (ref 13.0–17.0)
MCH: 31 pg (ref 26.0–34.0)
MCHC: 32.5 g/dL (ref 30.0–36.0)
MCV: 95.2 fL (ref 80.0–100.0)
Platelets: 401 10*3/uL — ABNORMAL HIGH (ref 150–400)
RBC: 3.78 MIL/uL — ABNORMAL LOW (ref 4.22–5.81)
RDW: 13.3 % (ref 11.5–15.5)
WBC: 9.6 10*3/uL (ref 4.0–10.5)
nRBC: 0 % (ref 0.0–0.2)

## 2019-07-03 LAB — AEROBIC/ANAEROBIC CULTURE W GRAM STAIN (SURGICAL/DEEP WOUND): Culture: NO GROWTH

## 2019-07-03 LAB — BASIC METABOLIC PANEL
Anion gap: 10 (ref 5–15)
BUN: 22 mg/dL (ref 8–23)
CO2: 29 mmol/L (ref 22–32)
Calcium: 8.7 mg/dL — ABNORMAL LOW (ref 8.9–10.3)
Chloride: 99 mmol/L (ref 98–111)
Creatinine, Ser: 0.64 mg/dL (ref 0.61–1.24)
GFR calc Af Amer: >60 mL/min (ref >60)
GFR calc non Af Amer: >60 mL/min (ref >60)
Glucose, Bld: 98 mg/dL (ref 70–99)
Potassium: 4.1 mmol/L (ref 3.5–5.1)
Sodium: 138 mmol/L (ref 135–145)

## 2019-07-03 MED ORDER — VANCOMYCIN IV (FOR PTA / DISCHARGE USE ONLY): 1000.0000 mg | Freq: Two times a day (BID) | INTRAVENOUS | 0 refills | Status: DC

## 2019-07-03 MED ORDER — SODIUM CHLORIDE 0.9 % IV SOLN
2.0000 g | Freq: Three times a day (TID) | INTRAVENOUS | Status: DC
  Administered 2019-07-03 – 2019-07-04 (×3): 2 g via INTRAVENOUS
  Filled 2019-07-03 (×3): qty 2

## 2019-07-03 MED ORDER — DULOXETINE HCL 30 MG PO CPEP
30.0000 mg | ORAL_CAPSULE | Freq: Once | ORAL | Status: AC
  Administered 2019-07-03: 30 mg via ORAL
  Filled 2019-07-03: qty 1

## 2019-07-03 MED ORDER — BACLOFEN 10 MG PO TABS
10.0000 mg | ORAL_TABLET | Freq: Three times a day (TID) | ORAL | Status: DC
  Administered 2019-07-03 – 2019-07-04 (×3): 10 mg via ORAL
  Filled 2019-07-03 (×3): qty 1

## 2019-07-03 MED ORDER — DULOXETINE HCL 60 MG PO CPEP
60.0000 mg | ORAL_CAPSULE | Freq: Every day | ORAL | Status: DC
  Administered 2019-07-04: 60 mg via ORAL
  Filled 2019-07-03: qty 1

## 2019-07-03 MED ORDER — CEFEPIME IV (FOR PTA / DISCHARGE USE ONLY): 2.0000 g | Freq: Three times a day (TID) | INTRAVENOUS | 0 refills | Status: DC

## 2019-07-03 NOTE — Plan of Care (Signed)
  Problem: Skin Integrity: Goal: Risk for impaired skin integrity will decrease Outcome: Progressing   Problem: Safety: Goal: Ability to remain free from injury will improve Outcome: Progressing   Problem: Pain Managment: Goal: General experience of comfort will improve Outcome: Progressing   Problem: Elimination: Goal: Will not experience complications related to bowel motility Outcome: Progressing   Problem: Nutrition: Goal: Adequate nutrition will be maintained Outcome: Progressing

## 2019-07-03 NOTE — Progress Notes (Signed)
PHARMACY CONSULT NOTE FOR:  OUTPATIENT  PARENTERAL ANTIBIOTIC THERAPY (OPAT)  Indication: Lumbar discitis Regimen: Vancomycin 1000 mg every 12 hours + Cefepime 2000 mg every 8 hours  End date: 08/20/19  IV antibiotic discharge orders are pended. To discharging provider:  please sign these orders via discharge navigator,  Select New Orders & click on the button choice - Manage This Unsigned Work.     Thank you for allowing pharmacy to be a part of this patient's care.  Thank you for the interesting consult and for involving pharmacy in this patient's care.  Tamela Gammon, PharmD, BCPS 07/03/2019 1:58 PM PGY-2 Pharmacy Administration Resident Please check [email or web address] for unit-specific pharmacist phone numbers

## 2019-07-03 NOTE — Progress Notes (Signed)
Family Medicine Teaching Service Daily Progress Note Intern Pager: [phone]  Patient name: Ivan [name] Medical record number: [id number] Date of birth: [birth year] Age: 74 y.o. Gender: male   Primary Care Provider: Sherald [name]., MD Consultants: IR, ID Code Status: full  Pt Overview and Major Events to Date:  2/13-admitted 2/15-disc aspiration 2/18-left hip ulcer debridement  Assessment and Plan: [name] a 74 y.o.malepresenting to the ED at direction of PCP due to increase inflammatory markers and concerning lumbar MRI with possible discitis/osteomyelitis/psoas abscess. PMH is significant forDDD, CAD, HTN, HLD, BPH, recent COVID+, DM, diet controlled.   Antibiotic course CTX (2/15 - 2/20) Cefepime (2/20 - ) Vancomycin (2/15 - )  Lower extremity weakness with MRI c/f Lumbar discitis/osteomyelitis and psoas abscess  Hx of DDD Only symptoms remain his weakness, which is improving with PT/OT.  Remains afebrile with no leukocytosis.  Patient has declined SNF.  S/p IR aspiration of thick fluid form L 1-2 space 2/15 Culture NG for > 5 days. Left hip culture with  rare enterobacter resistant to ancef, few enterococcus w/ pansensitivitiy -ID recommendations, changed ABX to cefepime and Vancomycin -Consult plastics vs. General surgery for wound care regarding wound vac change -Continue cefepime & Vanc per PICC. -OPAT orders placed -Consult to PT/OT eval and treat -Declining SNF.  Will need home health PT -Follow-up aspirate and wound cultures -Pain management: -ContinueTylenol 1000mg  q6h -OxyIR 5-10 mg every 4 hours as needed for breakthrough pain -increase baclofen to 10mg  TIDfor muscle spasms             - lidocaine patch q24hr             - mag oxide 400mg  PRN for muscle cramps  - increase duloxetine60 mg -amb ref to wound care on discharge   BPHStable.  Currently asymptomatic.  Home medications areFinasteride  5mg andTamsulosin 0.8mg . max doses for each.  -continue home medications   Pressure Ulcers Has aknownpressure ulcer on left hip(healing, stage 3-4)with some sloughing open to airw/ no streaking/purulence or noted cellulitis surrounding.MRI left hip 7.8 cm gas and fluid collection in the subcutaneous fat overlying the lateral left hip with surrounding soft tissue swelling, consistent with abscess. Left hip wound debridment 2/18 w/ plastics. Cultures obtained. Other pressure ulcer on left heel with a closed wound. Likely due to decrease in mobility over the last month.  -S/p surgical debridement. -Follow-up wound cultures -Wound care recommendations:             - left heel: foam dressing, change q3d.              - left hip: wound vac  CAD/Hx of MI  No chest pain or shortness of breath currently.  -Continue homeASA 81mg    HLD Last lipid panel 02/20/2018 Tot chol 150, LDL 73, HDL 47, triglycerides 107. 05/26/2019 triglycerides 112. Home medications areCrestor 10mg . -Continue home medications   HTN Normotensive since admission.Home medication areAmlodipine 10mg andLisinopril 40mg . -Continue home medications -monitor w/ vital signs per protocol  RecentCOVIDinfection Tested positive 05/18/2019. Has since tested negative 06/02/2019. Does not have any respiratory symptoms with this admission but is coming from a skilled nursing facility. covid negative on 2/13.   Depression w/ anxiety Home medicationCymbalta 30mg . - increase to 60 mg for pain augmentation  Diet controlled DM 2 Last A1c 05/20/2019. Glu on admission BMP 154. Remains normal since admission -monitor on BMP  FEN/GI: Regular diet -miralax PRN, senna PRN  PPx: Lovenox   Disposition: Pending Plastic Surgery recommendations  regarding wound vac, likely  home w/ HH PT,OT,RN,Home Aid, Amb ref to Wound Clinic  Subjective:  No acute events overnight.  States he is ambulating well without any difficulty to his  bedside commode. Wants to go home, curious if it is today. Inform him we need plastic surgery to review his case.  He is in agreement with this.  Objective: Temp:  [97.6 F (36.4 C)-97.9 F (36.6 C)] 97.6 F (36.4 C) (02/20 2006) Pulse Rate:  [71-83] 81 (02/20 2006) Resp:  [14-18] 14 (02/20 2006) BP: (101-124)/(62-88) 102/66 (02/20 2006) SpO2:  [94 %-98 %] 98 % (02/20 2006) Physical Exam: General: Pleasant demeanor alert and oriented.  Laying in bed. No acute distress.  Cardiovascular: RRR. No murmurs.  2+ radial pulses.   Respiratory: LCTAB.  No crackles or wheezing.  Abdomen: soft, nontender. Normal bowel sounds.  Extremities: strength in lower extremities 5/5 and equal bilaterally.   Laboratory: Recent Labs  Lab 07/01/19 0357 07/02/19 0240 07/03/19 0338  WBC 9.7 10.9* 9.6  HGB 12.3* 12.0* 11.7*  HCT 37.4* 36.7* 36.0*  PLT 362 397 401*   Recent Labs  Lab 07/01/19 0357 07/02/19 0240 07/03/19 0338  NA 135 136 138  K 4.1 4.3 4.1  CL 99 98 99  CO2 27 27 29   BUN 15 16 22   CREATININE 0.49* 0.57* 0.64  CALCIUM 8.8* 9.1 8.7*  GLUCOSE 95 152* 98     Bonnita Hollow, MD 07/03/2019, 10:28 PM PGY-3, [place] Intern pager: [phone], text pages welcome

## 2019-07-03 NOTE — Plan of Care (Signed)
  Problem: Pain Managment: Goal: General experience of comfort will improve Outcome: Progressing   

## 2019-07-03 NOTE — Progress Notes (Addendum)
Pharmacy Antibiotic Note  Ivan [name] is a 74 y.o. male admitted on 06/25/2019 with increased inflammatory markers and lumbar MRI concerning for possible discitis/osteomyelitis/psoas abscess. Pharmacy has been consulted for vancomycin dosing for osteomyelitis; and now consulted for cefepime.  Patient wbc 9.6, down from 10.9, afebrile, ScrCl 87.35ml/min.   He has grown ampicillin sensitive Enterococcus faecalis and Enterobacter species (R-to cefazolin) from hip wound disc aspirate is unrevealing. ID wants to broaden cefepime to cover enterobacter species.    Plan: Start Cefepime IV 2g q8h Continue vancomycin 1000 mg IV q12h. Total duration of 8 weeks per ID consult.   PICC placed and OPAT updated 2/20.  Monitor clinical progress, renal function, cultures and sensitivities, and vancomycin levels as clinically indicated.   Height: 5\' 10"  (177.8 cm) Weight: 174 lb 9.7 oz (79.2 kg) IBW/kg (Calculated) : 73  Temp (24hrs), Avg:97.8 F (36.6 C), Min:97.8 F (36.6 C), Max:97.9 F (36.6 C)  Recent Labs  Lab 06/29/19 0631 06/30/19 0322 06/30/19 1827 07/01/19 0357 07/02/19 0240 07/03/19 0338  WBC 9.4 9.9  --  9.7 10.9* 9.6  CREATININE 0.60* 0.71  --  0.49* 0.57* 0.64  VANCOTROUGH  --   --   --  12*  --   --   VANCOPEAK  --   --  37  --   --   --     Estimated Creatinine Clearance: 87.4 mL/min (by C-G formula based on SCr of 0.64 mg/dL).    Allergies  Allergen Reactions  . Contrast Media [Iodinated Diagnostic Agents] Other (See Comments)    Vascular headache    Antimicrobials this admission: Ceftriaxone 2/15 >> 2/20 Vancomycin 2/15 >> Cefepime 2/20 >>  Dose adjustments this admission:   Microbiology results: 2/12 BCx: negative 2/12 UCx: negative 2/15 Disc Aspiration: enterobacter aerogenes, enterococcus faecalis.     Thank you for the interesting consult and for involving pharmacy in this patient's care.  Tamela Gammon, PharmD, BCPS 07/03/2019 1:50 PM PGY-2  Pharmacy Administration Resident Please check [email or web address] for unit-specific pharmacist phone numbers

## 2019-07-03 NOTE — Progress Notes (Signed)
Subjective:  No new complaints  Antibiotics:  Anti-infectives (From admission, onward)   Start     Dose/Rate Route Frequency Ordered Stop   07/01/19 0811  polymyxin B 500,000 Units, bacitracin 50,000 Units in sodium chloride 0.9 % 500 mL irrigation  Status:  Discontinued       As needed 07/01/19 0811 07/01/19 0831   06/29/19 0500  vancomycin (VANCOCIN) IVPB 1000 mg/200 mL premix     1,000 mg 200 mL/hr over 60 Minutes Intravenous Every 12 hours 06/28/19 1557     06/28/19 1700  vancomycin (VANCOREADY) IVPB 1750 mg/350 mL     1,750 mg 175 mL/hr over 120 Minutes Intravenous  Once 06/28/19 1511 06/28/19 1800   06/28/19 1600  cefTRIAXone (ROCEPHIN) 2 g in sodium chloride 0.9 % 100 mL IVPB     2 g 200 mL/hr over 30 Minutes Intravenous Every 24 hours 06/28/19 1500     06/26/19 1500  cefTRIAXone (ROCEPHIN) 1 g in sodium chloride 0.9 % 100 mL IVPB  Status:  Discontinued     1 g 200 mL/hr over 30 Minutes Intravenous Every 24 hours 06/26/19 1452 06/26/19 1455      Medications: Scheduled Meds: . acetaminophen  1,000 mg Oral Q6H  . amLODipine  10 mg Oral Daily  . aspirin EC  81 mg Oral Daily  . baclofen  10 mg Oral TID  . Chlorhexidine Gluconate Cloth  6 each Topical Daily  . Chlorhexidine Gluconate Cloth  6 each Topical Daily  . DULoxetine  30 mg Oral Daily  . enoxaparin (LOVENOX) injection  40 mg Subcutaneous Q24H  . feeding supplement (ENSURE ENLIVE)  237 mL Oral TID BM  . finasteride  5 mg Oral Daily  . lidocaine  1 patch Transdermal Q24H  . multivitamin with minerals  1 tablet Oral Daily  . rosuvastatin  10 mg Oral Daily  . tamsulosin  0.8 mg Oral Daily   Continuous Infusions: . cefTRIAXone (ROCEPHIN)  IV 2 g (07/02/19 1532)  . vancomycin 1,000 mg (07/03/19 0509)   PRN Meds:.magnesium oxide, oxyCODONE, polyethylene glycol, senna, sodium chloride flush    Objective: Weight change:   Intake/Output Summary (Last 24 hours) at 07/03/2019 1328 Last data filed at  07/03/2019 1025 Gross per 24 hour  Intake 840 ml  Output 651 ml  Net 189 ml   Blood pressure 124/64, pulse 79, temperature 97.8 F (36.6 C), temperature source Oral, resp. rate 17, height 5\' 10"  (1.778 m), weight 79.2 kg, SpO2 96 %. Temp:  [97.7 F (36.5 C)-97.9 F (36.6 C)] 97.8 F (36.6 C) (02/20 0841) Pulse Rate:  [71-89] 79 (02/20 0841) Resp:  [16-18] 17 (02/20 0841) BP: (117-125)/(63-88) 124/64 (02/20 0841) SpO2:  [96 %-99 %] 96 % (02/20 0841)  Physical Exam: General: Alert and awake, oriented x3, not in any acute distress. HEENT: anicteric sclera, EOMI CVS regular rate, normal  Chest: , no wheezing, no respiratory distress Abdomen: soft non-distended,  Extremities: deep wound on hip with bandage, wound to left foot Neuro: nonfocal  CBC:    BMET Recent Labs    07/02/19 0240 07/03/19 0338  NA 136 138  K 4.3 4.1  CL 98 99  CO2 27 29  GLUCOSE 152* 98  BUN 16 22  CREATININE 0.57* 0.64  CALCIUM 9.1 8.7*     Liver Panel  No results for input(s): PROT, ALBUMIN, AST, ALT, ALKPHOS, BILITOT, BILIDIR, IBILI in the last 72 hours.     Sedimentation Rate  No results for input(s): ESRSEDRATE in the last 72 hours. C-Reactive Protein No results for input(s): CRP in the last 72 hours.  Micro Results: Recent Results (from the past 720 hour(s))  Blood culture (routine x 2)     Status: None   Collection Time: 06/25/19  8:55 PM   Specimen: BLOOD RIGHT FOREARM  Result Value Ref Range Status   Specimen Description BLOOD RIGHT FOREARM  Final   Special Requests   Final    BOTTLES DRAWN AEROBIC AND ANAEROBIC Blood Culture adequate volume   Culture   Final    NO GROWTH 5 DAYS Performed at [facility], [address], [place], [place] [address]    Report Status 06/30/2019 FINAL  Final  Blood culture (routine x 2)     Status: None   Collection Time: 06/25/19  9:02 PM   Specimen: BLOOD RIGHT FOREARM  Result Value Ref Range Status   Specimen Description BLOOD  RIGHT FOREARM  Final   Special Requests   Final    BOTTLES DRAWN AEROBIC AND ANAEROBIC Blood Culture results may not be optimal due to an excessive volume of blood received in culture bottles   Culture   Final    NO GROWTH 5 DAYS Performed at [facility], [place] [address] [place], [place] [address]    Report Status 06/30/2019 FINAL  Final  Urine Culture     Status: None   Collection Time: 06/25/19 10:50 PM   Specimen: Urine, Clean Catch  Result Value Ref Range Status   Specimen Description URINE, CLEAN CATCH  Final   Special Requests NONE  Final   Culture   Final    NO GROWTH Performed at [facility], [place] [address], [place], [place] [address]    Report Status 06/27/2019 FINAL  Final  SARS CORONAVIRUS 2 (TAT 6-24 HRS) Nasopharyngeal Nasopharyngeal Swab     Status: None   Collection Time: 06/25/19 10:55 PM   Specimen: Nasopharyngeal Swab  Result Value Ref Range Status   SARS Coronavirus 2 NEGATIVE NEGATIVE Final    Comment: (NOTE) SARS-CoV-2 target nucleic acids are NOT DETECTED. The SARS-CoV-2 RNA is generally detectable in upper and lower respiratory specimens during the acute phase of infection. Negative results do not preclude SARS-CoV-2 infection, do not rule out co-infections with other pathogens, and should not be used as the sole basis for treatment or other patient management decisions. Negative results must be combined with clinical observations, patient history, and epidemiological information. The expected result is Negative. Fact Sheet for Patients: SugarRoll.be Fact Sheet for Healthcare Providers: https://www.woods-mathews.com/ This test is not yet approved or cleared by the [place] FDA and  has been authorized for detection and/or diagnosis of SARS-CoV-2 by FDA under an Emergency Use Authorization (EUA). This EUA will remain  in effect (meaning this test can be used) for the duration of the COVID-19  declaration under Section 56 4(b)(1) of the Act, 21 U.S.C. section 360bbb-3(b)(1), unless the authorization is terminated or revoked sooner. Performed at [facility], [place] [address] [place], [place] [address]   Aerobic/Anaerobic Culture (surgical/deep wound)     Status: None   Collection Time: 06/28/19  2:36 PM   Specimen: PATH Disc; Tissue  Result Value Ref Range Status   Specimen Description WOUND  Final   Special Requests L1 L2 DISC ASPIRATE  Final   Gram Stain   Final    MODERATE WBC PRESENT,BOTH PMN AND MONONUCLEAR NO ORGANISMS SEEN Performed  at [facility], [place] [address], [place], [place] [address]    Culture No growth aerobically or anaerobically.  Final   Report Status 07/03/2019 FINAL  Final  Surgical pcr screen     Status: None   Collection Time: 07/01/19  1:49 AM   Specimen: Nasal Mucosa; Nasal Swab  Result Value Ref Range Status   MRSA, PCR NEGATIVE NEGATIVE Final   Staphylococcus aureus NEGATIVE NEGATIVE Final    Comment: (NOTE) The Xpert SA Assay (FDA approved for NASAL specimens in patients 60 years of age and older), is one component of a comprehensive surveillance program. It is not intended to diagnose infection nor to guide or monitor treatment. Performed at [facility], [place] [address], [place], [place] [address]   Aerobic/Anaerobic Culture (surgical/deep wound)     Status: None (Preliminary result)   Collection Time: 07/01/19  8:07 AM   Specimen: PATH Soft tissue  Result Value Ref Range Status   Specimen Description WOUND LEFT HIP  Final   Special Requests PATIENT ON FOLLOWING VANCOMYCIN, ROCEPHIN  Final   Gram Stain   Final    NO WBC SEEN RARE GRAM POSITIVE COCCI IN PAIRS Performed at [facility], [address], [place], [place] [address]    Culture   Final    RARE ENTEROBACTER AEROGENES FEW ENTEROCOCCUS FAECALIS NO ANAEROBES ISOLATED; CULTURE IN PROGRESS FOR 5 DAYS    Report Status PENDING  Incomplete   Organism  ID, Bacteria ENTEROBACTER AEROGENES  Final   Organism ID, Bacteria ENTEROCOCCUS FAECALIS  Final      Susceptibility   Enterobacter aerogenes - MIC*    CEFAZOLIN RESISTANT Resistant     CEFEPIME <=0.12 SENSITIVE Sensitive     CEFTAZIDIME <=1 SENSITIVE Sensitive     CEFTRIAXONE <=0.25 SENSITIVE Sensitive     CIPROFLOXACIN <=0.25 SENSITIVE Sensitive     GENTAMICIN <=1 SENSITIVE Sensitive     IMIPENEM <=0.25 SENSITIVE Sensitive     TRIMETH/SULFA <=20 SENSITIVE Sensitive     PIP/TAZO <=4 SENSITIVE Sensitive     * RARE ENTEROBACTER AEROGENES   Enterococcus faecalis - MIC*    AMPICILLIN <=2 SENSITIVE Sensitive     VANCOMYCIN 1 SENSITIVE Sensitive     GENTAMICIN SYNERGY SENSITIVE Sensitive     * FEW ENTEROCOCCUS FAECALIS    Studies/Results: No results found.    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  He was eventually admitted to the hospital on 05/18/2019 and found to have Covid infection.  Admission notes also indicate that they thought he had new abdominal pain.  He was treated for community-acquired pneumonia and a UTI in addition to receiving 4 days of remdesivir and steroids before leaving [place].  He continued to have delirium and severe back pain after discharge and laid on the floor for 4 days straight  prior to being readmitted on 05/26/2019.  New left hip  pressure wound.  He was treated empirically for possible sepsis and received 3 days of vancomycin, cefepime and metronidazole.  He was discharged on 06/09/2019.  Has not been able to walk more than 10 steps since he first got sick because of severe pain.  On 06/22/2019 which showed evidence of discitis and osteomyelitis at the L1-2 level with a 1 cm left psoas abscess.  He also had some early, increased signal at T10-11, L2-3 and L5-S  #1 Lumbar diskitis and osteomyelitis with left psoas muscle abscess  I am changing him to vancomycin and cefepime to better provide reliable coverage against the Enterobacter in his hip wound we will use the same antibiotic stop date  Written new O PAT order   #2 hip deep pressure wound: xsp  Ampicillin sensitive Enterococcus and Enterobacter isolated will change from ceftriaxone to cefepime for more reliable coverage of Enterobacter.  Ivan [name] has an appointment on 08/02/2019 at 56 AM with Dr. Tommy Medal.  The [facility] is located in the [place] at  [place] in [place].  [address], which is located to the left of the elevators.  Phone: [phone]  Fax: [phone]  [email or web address]  He should arrive 15 minutes prior to his appointment.   We will follow up on disc cultures but otherwise will sign off for now please call with further questions.    LOS: 8 days   Alcide Evener 07/03/2019, 1:28 PM

## 2019-07-03 NOTE — Progress Notes (Signed)
Family Medicine Teaching Service Daily Progress Note Intern Pager: [phone]  Patient name: Ivan [name] Medical record number: [id number] Date of birth: [birth year] Age: 74 y.o. Gender: male   Primary Care Provider: Sherald [name]., MD Consultants: IR, ID Code Status: full  Pt Overview and Major Events to Date:  2/13-admitted 2/15-disc aspiration 2/18-left hip ulcer debridement  Assessment and Plan: [name] a 74 y.o.malepresenting to the ED at direction of PCP due to increase inflammatory markers and concerning lumbar MRI with possible discitis/osteomyelitis/psoas abscess. PMH is significant forDDD, CAD, HTN, HLD, BPH, recent COVID+, DM, diet controlled.   Lower extremity weakness with MRI c/f Lumbar discitis/osteomyelitis and psoas abscess  Hx of DDD Only symptoms remain his weakness, which is improving with PT/OT.  Remains afebrile with no leukocytosis.  Patient has declined SNF.  S/p IR aspiration of thick fluid form L 1-2 space 2/15 Culture NG for > 4 days.  Afebrile. WBC 10.9. -Consult to infectious disease, appreciate recommendations -Consult plastics for wound care. -Continue CTX (2/15-) Vanc (2/15-) PICC. -OPAT orders placed -Consult to PT/OT eval and treat -Declining SNF.  Will need home health PT -Follow-up aspirate and wound cultures -Pain management: -ContinueTylenol 1000mg  q6h -OxyIR 5 mg every 4 hours as needed for breakthrough pain -increase baclofen to 10mg  TIDfor muscle spasms             - lidocaine patch q24hr             - mag oxide 400mg  PRN for muscle cramps   BPHStable.  Currently asymptomatic.  Home medications areFinasteride 5mg andTamsulosin 0.8mg . max doses for each.  -continue home medications   Pressure Ulcers Has aknownpressure ulcer on left hip(healing, stage 3-4)with some sloughing open to airw/ no streaking/purulence or noted cellulitis surrounding.MRI left hip 7.8 cm gas  and fluid collection in the subcutaneous fat overlying the lateral left hip with surrounding soft tissue swelling, consistent with abscess. Left hip wound debridment 2/18 w/ plastics. Cultures obtained. Other pressure ulcer on left heel with a closed wound. Likely due to decrease in mobility over the last month.  -S/p surgical debridement. -Follow-up wound cultures -Wound care recommendations:             - left heel: foam dressing, change q3d.              - left hip: wound vac  CAD/Hx of MI  No chest pain or shortness of breath currently.  -Continue homeASA 81mg    HLD Last lipid panel 02/20/2018 Tot chol 150, LDL 73, HDL 47, triglycerides 107. 05/26/2019 triglycerides 112. Home medications areCrestor 10mg . -Continue home medications   HTN Normotensive since admission.Home medication areAmlodipine 10mg andLisinopril 40mg . -Continue home medications -monitor w/ vital signs per protocol  RecentCOVIDinfection Tested positive 05/18/2019. Has since tested negative 06/02/2019. Does not have any respiratory symptoms with this admission but is coming from a skilled nursing facility. covid negative on 2/13.   Depression w/ anxiety Home medicationCymbalta 30mg . Continue home medication.  Diet controlled DM 2 Last A1c 05/20/2019. Glu on admission BMP 154. Remains normal since admission -monitor on BMP  FEN/GI: Regular diet -miralax PRN, senna PRN  PPx: Lovenox   Disposition: home  Subjective:  The patient feels much better today.  Wants to go home tomorrow.  Would like his baclofen increased for his muscle spasms.  No difficulty with urination or bowel movement.  Still declining SNF and wanting home PT/OT. Feels like he is progressing well with PT.    Objective: Temp:  [  97.7 F (36.5 C)-97.9 F (36.6 C)] 97.8 F (36.6 C) (02/20 0339) Pulse Rate:  [71-89] 71 (02/20 0339) Resp:  [16-18] 18 (02/20 0339) BP: (114-125)/(63-88) 117/88 (02/20 0339) SpO2:  [97 %-99 %] 98 %  (02/20 0339) Physical Exam: General: alert and oriented.  Laying in bed. No acute distress.  Cardiovascular: RRR. No murmurs.  2+ radial pulses.   Respiratory: LCTAB.  No crackles or wheezing.  Abdomen: soft, nontender. Normal bowel sounds.  Extremities: strength in lower extremities 5/5 and equal bilaterally.   Laboratory: Recent Labs  Lab 07/01/19 0357 07/02/19 0240 07/03/19 0338  WBC 9.7 10.9* 9.6  HGB 12.3* 12.0* 11.7*  HCT 37.4* 36.7* 36.0*  PLT 362 397 401*   Recent Labs  Lab 07/01/19 0357 07/02/19 0240 07/03/19 0338  NA 135 136 138  K 4.1 4.3 4.1  CL 99 98 99  CO2 27 27 29   BUN 15 16 22   CREATININE 0.49* 0.57* 0.64  CALCIUM 8.8* 9.1 8.7*  GLUCOSE 95 152* 98     [name], MD 07/03/2019, 6:46 AM PGY-2, [place] Intern pager: [phone], text pages welcome

## 2019-07-04 ENCOUNTER — Emergency Department (HOSPITAL_COMMUNITY)
Admission: EM | Admit: 2019-07-04 | Discharge: 2019-07-05 | Disposition: A | Discharge status: Home / Self Care | Payer: Medicare HMO | Attending: Emergency Medicine | Admitting: Emergency Medicine

## 2019-07-04 ENCOUNTER — Encounter (HOSPITAL_COMMUNITY): Payer: Self-pay | Admitting: Emergency Medicine

## 2019-07-04 ENCOUNTER — Other Ambulatory Visit: Payer: Self-pay

## 2019-07-04 DIAGNOSIS — Y999 Unspecified external cause status: Secondary | ICD-10-CM | POA: Insufficient documentation

## 2019-07-04 DIAGNOSIS — W1839XA Other fall on same level, initial encounter: Secondary | ICD-10-CM | POA: Insufficient documentation

## 2019-07-04 DIAGNOSIS — Y929 Unspecified place or not applicable: Secondary | ICD-10-CM | POA: Insufficient documentation

## 2019-07-04 DIAGNOSIS — K6812 Psoas muscle abscess: Secondary | ICD-10-CM | POA: Insufficient documentation

## 2019-07-04 DIAGNOSIS — M869 Osteomyelitis, unspecified: Secondary | ICD-10-CM | POA: Insufficient documentation

## 2019-07-04 DIAGNOSIS — M4640 Discitis, unspecified, site unspecified: Secondary | ICD-10-CM | POA: Insufficient documentation

## 2019-07-04 DIAGNOSIS — R5381 Other malaise: Secondary | ICD-10-CM | POA: Insufficient documentation

## 2019-07-04 DIAGNOSIS — W19XXXA Unspecified fall, initial encounter: Secondary | ICD-10-CM

## 2019-07-04 DIAGNOSIS — I2510 Atherosclerotic heart disease of native coronary artery without angina pectoris: Secondary | ICD-10-CM | POA: Insufficient documentation

## 2019-07-04 DIAGNOSIS — I119 Hypertensive heart disease without heart failure: Secondary | ICD-10-CM | POA: Insufficient documentation

## 2019-07-04 DIAGNOSIS — Z8616 Personal history of COVID-19: Secondary | ICD-10-CM | POA: Insufficient documentation

## 2019-07-04 DIAGNOSIS — Z951 Presence of aortocoronary bypass graft: Secondary | ICD-10-CM | POA: Insufficient documentation

## 2019-07-04 DIAGNOSIS — Z79899 Other long term (current) drug therapy: Secondary | ICD-10-CM | POA: Insufficient documentation

## 2019-07-04 DIAGNOSIS — Y92009 Unspecified place in unspecified non-institutional (private) residence as the place of occurrence of the external cause: Secondary | ICD-10-CM

## 2019-07-04 DIAGNOSIS — Y9389 Activity, other specified: Secondary | ICD-10-CM | POA: Insufficient documentation

## 2019-07-04 LAB — BASIC METABOLIC PANEL
Anion gap: 8 (ref 5–15)
Anion gap: 16 — ABNORMAL HIGH (ref 5–15)
BUN: 18 mg/dL (ref 8–23)
BUN: 20 mg/dL (ref 8–23)
CO2: 28 mmol/L (ref 22–32)
CO2: 22 mmol/L (ref 22–32)
Calcium: 8.7 mg/dL — ABNORMAL LOW (ref 8.9–10.3)
Calcium: 9.7 mg/dL (ref 8.9–10.3)
Chloride: 100 mmol/L (ref 98–111)
Chloride: 97 mmol/L — ABNORMAL LOW (ref 98–111)
Creatinine, Ser: 0.64 mg/dL (ref 0.61–1.24)
Creatinine, Ser: 0.82 mg/dL (ref 0.61–1.24)
GFR calc Af Amer: >60 mL/min (ref >60)
GFR calc Af Amer: >60 mL/min (ref >60)
GFR calc non Af Amer: >60 mL/min (ref >60)
GFR calc non Af Amer: >60 mL/min (ref >60)
Glucose, Bld: 112 mg/dL — ABNORMAL HIGH (ref 70–99)
Glucose, Bld: 155 mg/dL — ABNORMAL HIGH (ref 70–99)
Potassium: 4.3 mmol/L (ref 3.5–5.1)
Potassium: 5 mmol/L (ref 3.5–5.1)
Sodium: 136 mmol/L (ref 135–145)
Sodium: 135 mmol/L (ref 135–145)

## 2019-07-04 LAB — CBC
HCT: 36.5 % — ABNORMAL LOW (ref 39.0–52.0)
HCT: 42.4 % (ref 39.0–52.0)
Hemoglobin: 11.9 g/dL — ABNORMAL LOW (ref 13.0–17.0)
Hemoglobin: 13.9 g/dL (ref 13.0–17.0)
MCH: 31.1 pg (ref 26.0–34.0)
MCH: 31.2 pg (ref 26.0–34.0)
MCHC: 32.6 g/dL (ref 30.0–36.0)
MCHC: 32.8 g/dL (ref 30.0–36.0)
MCV: 95.3 fL (ref 80.0–100.0)
MCV: 95.3 fL (ref 80.0–100.0)
Platelets: 407 10*3/uL — ABNORMAL HIGH (ref 150–400)
Platelets: 471 10*3/uL — ABNORMAL HIGH (ref 150–400)
RBC: 3.83 MIL/uL — ABNORMAL LOW (ref 4.22–5.81)
RBC: 4.45 MIL/uL (ref 4.22–5.81)
RDW: 13.2 % (ref 11.5–15.5)
RDW: 13.2 % (ref 11.5–15.5)
WBC: 11.6 10*3/uL — ABNORMAL HIGH (ref 4.0–10.5)
WBC: 14.7 10*3/uL — ABNORMAL HIGH (ref 4.0–10.5)
nRBC: 0 % (ref 0.0–0.2)
nRBC: 0 % (ref 0.0–0.2)

## 2019-07-04 LAB — CBG MONITORING, ED: Glucose-Capillary: 169 mg/dL — ABNORMAL HIGH (ref 70–99)

## 2019-07-04 MED ORDER — OXYCODONE HCL 5 MG PO TABS: 5.0000 mg | ORAL_TABLET | ORAL | 0 refills | Status: DC | PRN

## 2019-07-04 MED ORDER — LIDOCAINE 5 % EX PTCH: 1.0000 | MEDICATED_PATCH | CUTANEOUS | 0 refills | Status: DC

## 2019-07-04 MED ORDER — HEPARIN SOD (PORK) LOCK FLUSH 100 UNIT/ML IV SOLN
250.0000 [IU] | INTRAVENOUS | Status: AC | PRN
  Administered 2019-07-04: 250 [IU]
  Filled 2019-07-04: qty 2.5

## 2019-07-04 MED ORDER — SENNA 8.6 MG PO TABS: 1.0000 | ORAL_TABLET | Freq: Every evening | ORAL | 0 refills | Status: DC | PRN

## 2019-07-04 MED ORDER — BACLOFEN 10 MG PO TABS: 10.0000 mg | ORAL_TABLET | Freq: Three times a day (TID) | ORAL | Status: DC

## 2019-07-04 MED ORDER — ADULT MULTIVITAMIN W/MINERALS CH: 1.0000 | ORAL_TABLET | Freq: Every day | ORAL | Status: AC

## 2019-07-04 MED ORDER — DULOXETINE HCL 30 MG PO CPEP: 30.0000 mg | ORAL_CAPSULE | Freq: Two times a day (BID) | ORAL | 0 refills | Status: DC

## 2019-07-04 MED ORDER — SODIUM CHLORIDE 0.9% FLUSH: 3.0000 mL | Freq: Once | INTRAVENOUS | Status: DC

## 2019-07-04 MED ORDER — POLYETHYLENE GLYCOL 3350 17 G PO PACK: 17.0000 g | PACK | Freq: Every day | ORAL | 0 refills | Status: AC | PRN

## 2019-07-04 MED ORDER — ACETAMINOPHEN 500 MG PO TABS: 1000.0000 mg | ORAL_TABLET | Freq: Four times a day (QID) | ORAL | 0 refills | Status: AC

## 2019-07-04 NOTE — ED Triage Notes (Addendum)
Pt transported from home by EMS with c/o generalized weakness since being released from this facility for spinal infection. Pt reports fall this evening.  Pt states he lost his balance tonight causing the fall, denies injury from fall, denies hitting head, pt reports he normally takes Plavix but has not had it since mid January d/t surgeries.
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
## 2019-07-04 NOTE — TOC Transition Note (Signed)
Transition of Care [facility]) - CM/SW Discharge Note   Patient Details  Name: Ivan [name] MRN: [id number] Date of Birth: [birth year]  [facility] CM/SW Contact:  Claudie Leach, RN 07/04/2019, 11:05 AM   Clinical Narrative:    After extensive discussions with patient, wife, medical staff, and [facility] staff yesterday evening and this morning, it is decided that patient will dc today.    Infusion RN, Pam, educated patient this morning on home antibiotic infusions.  Drugs will be delivered to patient's home 3-4pm.  Patient will receive Cefepime in hospital around 10am and administer first home dose of Cefepime around 5pm. Patient will also administer evening dose of Vancomycin tonight 9-10pm.   [place] RN will visit in the morning.    Patient has wound vac and needs DME wound vac ordered/ placed.  D/W Dr Marla Roe and Dr Garlan Fillers.  It is decided that since KCI wound vac is not available on the weekend, [name] will be placed and Dr. Eusebio Friendly office will order KCI on Monday.  [name] is aware that RN will leave Praveena wound vac until KCI is delivered, then initiate dressing changes.   Patient requests 3n1 and RW.  These items will be delivered to room prior to d/c.   Patient and wife express wife's anxiety over letting [facility] staff into the house. (Wife has a history of anxiety, hoarding, reclusiveness, etc.).  They understand and agree that RN will at least need access to the foyer area.  Patient plans to set up table and 2 chairs for care to administered in that area.  It is doubtful that they will allow PT/OT/HHA access to the house for continued therapy.    Final next level of care: [place] Barriers to Discharge: No Barriers Identified   Patient Goals and CMS Choice Patient states their goals for this hospitalization and ongoing recovery are:: to go home at discharge CMS Medicare.gov Compare Post Acute Care list provided to:: Patient Choice offered to / list  presented to : Patient  Discharge Placement                       Discharge Plan and Services     Post Acute Care Choice: Home Health, Durable Medical Equipment          DME Arranged: 3-N-1, Walker rolling DME Agency: [employer] Date DME Agency Contacted: 07/04/19 Time DME Agency Contacted: 0900 Representative spoke with at DME Agency: Jeneen Rinks HH Arranged: RN, PT, OT, Nurse's Aide [place] Agency: [facility], [employer] Date [place]: 07/04/19 Time [place]: 1044 Representative spoke with at [place]: Jefm Petty with [facility]  Social Determinants of Health (SDOH) Interventions     Readmission Risk Interventions No flowsheet data found.

## 2019-07-04 NOTE — Discharge Instructions (Signed)
Wound Care with Acell ° °Guide to Wound Care  °Proper wound care may reduce the risk of infection, improve healing rates, and limit scarring.  This is a general guide to help care for and manage wounds treated with ACell MicroMatrix?or Cytal® Wound Matrix.  ° °Dressing Changes °The frequency of dressing changes can vary based on which product was applied, the size of the wound, or the amount of wound drainage. Dressing inspections are recommended, at least weekly.   °If you have a Wound VAC it will be changed in one week after the first time it is applied.  Then it will be changed once or twice a week.   °If you don't have a Wound VAC, then place KY gel on the wound daily and cover with gauze. ° °Dressing Types °Primary Dressing:  Non-adherent dressing goes directly over wounds being treated with the powder or sheet (MicroMatrix and/or Cytal).  °Secondary Dressing:  Secures the primary dressing in place and provides extra protection, compression, and absorption. ° °1. Wash Hands - To help decrease the risk of infection, caregivers should wash their hands for a minimum of 20 seconds and may use medical gloves.  ° °2. Remove the Dressings - Avoid removing product from the wound by carefully removing the applicable dressing(s) at the time points recommended above, or as recommended by the treating physician.  °Expected Color and Odor:  It is entirely normal for the wound to have an unpleasant odor and to form a caramel-colored gel as the product absorbs into the wound. It is  °important to leave this gel on the wound site. ° °3. Clean the Wound - Use clean water or saline to gently rinse around the wound surface and remove any excess discharge that may be present on the wound. Do not wipe off any of the caramel-colored gel on the wound.  ° °What to look out for: °• Large or increased amount of drainage  °• Surrounding skin has worsening redness or hot to touch  °• Increased pain in or around the wound  °• Flu-like  symptoms, fatigue, decreased appetite, fever  °• Hard, crusty wound surface with black or brown coloring ° °4. Apply New Dressings - Dressings should cover the entire wound and be suitable for maintaining a moist wound environment.  The non-adherent mesh dressing should be left in place.  New dressing should consist of KY Jelly to keep the wound moist and soft gauze secured with a wrap or tape.  ° °Maintain a Hydrated Wound Area °It is important to keep the wound area moist throughout the healing process. If the wound appears to be dry during dressing changes, select a dressing that will hydrate the wound and maintain that ideal moist environment. If you are unsure what to do, ask the treating physician. ° °Remodeling Process °Every patient heals differently, and no two cases are the same. The size and location of the wound, product type and layering configurations, and general patient health all contribute to how quickly a wound will heal.  While many factors can influence the rate at which the product absorbs, the following can be used as a general guide.  ° °THINGS TO DO: °Refrain from smoking °High protein diet with plenty of vegetables and some fruit  °Limit simple processed carbohydrates and sugar °Protect the wound from trauma °Protect the dressing ° °Micromatrix powder       Cytal Sheet            Sorbact dressing ° ° °

## 2019-07-04 NOTE — ED Notes (Signed)
Pt assisted in ambulation attempt. Pt able to standing with assistance, but yelled  " pain 10/10, can't walk" and sat back on the bed.

## 2019-07-05 ENCOUNTER — Emergency Department (HOSPITAL_COMMUNITY)
Admission: EM | Admit: 2019-07-05 | Discharge: 2019-07-07 | Disposition: A | Discharge status: Skilled Nursing Facility | Payer: Medicare HMO | Source: Home / Self Care | Attending: Emergency Medicine | Admitting: Emergency Medicine

## 2019-07-05 ENCOUNTER — Emergency Department (HOSPITAL_COMMUNITY): Payer: Medicare HMO

## 2019-07-05 ENCOUNTER — Encounter (HOSPITAL_COMMUNITY): Payer: Self-pay | Admitting: Emergency Medicine

## 2019-07-05 ENCOUNTER — Other Ambulatory Visit: Payer: Self-pay

## 2019-07-05 ENCOUNTER — Telehealth: Payer: Self-pay | Admitting: Family Medicine

## 2019-07-05 DIAGNOSIS — Z951 Presence of aortocoronary bypass graft: Secondary | ICD-10-CM | POA: Insufficient documentation

## 2019-07-05 DIAGNOSIS — Y929 Unspecified place or not applicable: Secondary | ICD-10-CM | POA: Insufficient documentation

## 2019-07-05 DIAGNOSIS — M4640 Discitis, unspecified, site unspecified: Secondary | ICD-10-CM | POA: Insufficient documentation

## 2019-07-05 DIAGNOSIS — Y9389 Activity, other specified: Secondary | ICD-10-CM | POA: Insufficient documentation

## 2019-07-05 DIAGNOSIS — M542 Cervicalgia: Secondary | ICD-10-CM | POA: Insufficient documentation

## 2019-07-05 DIAGNOSIS — Z7982 Long term (current) use of aspirin: Secondary | ICD-10-CM | POA: Insufficient documentation

## 2019-07-05 DIAGNOSIS — Y999 Unspecified external cause status: Secondary | ICD-10-CM | POA: Insufficient documentation

## 2019-07-05 DIAGNOSIS — E785 Hyperlipidemia, unspecified: Secondary | ICD-10-CM | POA: Insufficient documentation

## 2019-07-05 DIAGNOSIS — I2510 Atherosclerotic heart disease of native coronary artery without angina pectoris: Secondary | ICD-10-CM | POA: Insufficient documentation

## 2019-07-05 DIAGNOSIS — W19XXXA Unspecified fall, initial encounter: Secondary | ICD-10-CM

## 2019-07-05 DIAGNOSIS — U071 COVID-19: Secondary | ICD-10-CM | POA: Insufficient documentation

## 2019-07-05 DIAGNOSIS — I119 Hypertensive heart disease without heart failure: Secondary | ICD-10-CM | POA: Insufficient documentation

## 2019-07-05 DIAGNOSIS — Z79899 Other long term (current) drug therapy: Secondary | ICD-10-CM | POA: Insufficient documentation

## 2019-07-05 DIAGNOSIS — M869 Osteomyelitis, unspecified: Secondary | ICD-10-CM | POA: Insufficient documentation

## 2019-07-05 DIAGNOSIS — R531 Weakness: Secondary | ICD-10-CM | POA: Insufficient documentation

## 2019-07-05 DIAGNOSIS — W01198A Fall on same level from slipping, tripping and stumbling with subsequent striking against other object, initial encounter: Secondary | ICD-10-CM | POA: Insufficient documentation

## 2019-07-05 DIAGNOSIS — K6812 Psoas muscle abscess: Secondary | ICD-10-CM | POA: Insufficient documentation

## 2019-07-05 DIAGNOSIS — S0990XA Unspecified injury of head, initial encounter: Secondary | ICD-10-CM

## 2019-07-05 LAB — CBC WITH DIFFERENTIAL/PLATELET
Abs Immature Granulocytes: 0.08 10*3/uL — ABNORMAL HIGH (ref 0.00–0.07)
Basophils Absolute: 0 10*3/uL (ref 0.0–0.1)
Basophils Relative: 0 %
Eosinophils Absolute: 0.2 10*3/uL (ref 0.0–0.5)
Eosinophils Relative: 2 %
HCT: 37.3 % — ABNORMAL LOW (ref 39.0–52.0)
Hemoglobin: 12.1 g/dL — ABNORMAL LOW (ref 13.0–17.0)
Immature Granulocytes: 1 %
Lymphocytes Relative: 11 %
Lymphs Abs: 1.4 10*3/uL (ref 0.7–4.0)
MCH: 30.8 pg (ref 26.0–34.0)
MCHC: 32.4 g/dL (ref 30.0–36.0)
MCV: 94.9 fL (ref 80.0–100.0)
Monocytes Absolute: 1 10*3/uL (ref 0.1–1.0)
Monocytes Relative: 8 %
Neutro Abs: 10.2 10*3/uL — ABNORMAL HIGH (ref 1.7–7.7)
Neutrophils Relative %: 78 %
Platelets: 409 10*3/uL — ABNORMAL HIGH (ref 150–400)
RBC: 3.93 MIL/uL — ABNORMAL LOW (ref 4.22–5.81)
RDW: 13.3 % (ref 11.5–15.5)
WBC: 12.9 10*3/uL — ABNORMAL HIGH (ref 4.0–10.5)
nRBC: 0 % (ref 0.0–0.2)

## 2019-07-05 LAB — COMPREHENSIVE METABOLIC PANEL
ALT: 31 U/L (ref 0–44)
AST: 20 U/L (ref 15–41)
Albumin: 3.3 g/dL — ABNORMAL LOW (ref 3.5–5.0)
Alkaline Phosphatase: 72 U/L (ref 38–126)
Anion gap: 8 (ref 5–15)
BUN: 25 mg/dL — ABNORMAL HIGH (ref 8–23)
CO2: 30 mmol/L (ref 22–32)
Calcium: 9.1 mg/dL (ref 8.9–10.3)
Chloride: 100 mmol/L (ref 98–111)
Creatinine, Ser: 0.6 mg/dL — ABNORMAL LOW (ref 0.61–1.24)
GFR calc Af Amer: >60 mL/min (ref >60)
GFR calc non Af Amer: >60 mL/min (ref >60)
Glucose, Bld: 98 mg/dL (ref 70–99)
Potassium: 4.8 mmol/L (ref 3.5–5.1)
Sodium: 138 mmol/L (ref 135–145)
Total Bilirubin: 0.6 mg/dL (ref 0.3–1.2)
Total Protein: 6.5 g/dL (ref 6.5–8.1)

## 2019-07-05 LAB — SEDIMENTATION RATE: Sed Rate: 56 mm/hr — ABNORMAL HIGH (ref 0–16)

## 2019-07-05 MED ORDER — SENNA 8.6 MG PO TABS: 1.0000 | ORAL_TABLET | Freq: Every evening | ORAL | Status: DC | PRN

## 2019-07-05 MED ORDER — ASPIRIN EC 81 MG PO TBEC
81.0000 mg | DELAYED_RELEASE_TABLET | Freq: Every day | ORAL | Status: DC
  Administered 2019-07-06 – 2019-07-07 (×2): 81 mg via ORAL
  Filled 2019-07-05 (×2): qty 1

## 2019-07-05 MED ORDER — VANCOMYCIN HCL IN DEXTROSE 1-5 GM/200ML-% IV SOLN
1000.0000 mg | INTRAVENOUS | Status: AC
  Administered 2019-07-05: 1000 mg via INTRAVENOUS
  Filled 2019-07-05: qty 200

## 2019-07-05 MED ORDER — OXYCODONE HCL 5 MG PO TABS
5.0000 mg | ORAL_TABLET | ORAL | Status: DC | PRN
  Administered 2019-07-05 – 2019-07-07 (×6): 10 mg via ORAL
  Filled 2019-07-05 (×6): qty 2

## 2019-07-05 MED ORDER — VANCOMYCIN HCL IN DEXTROSE 1-5 GM/200ML-% IV SOLN
1000.0000 mg | Freq: Two times a day (BID) | INTRAVENOUS | Status: DC
  Administered 2019-07-06 – 2019-07-07 (×3): 1000 mg via INTRAVENOUS
  Filled 2019-07-05 (×4): qty 200

## 2019-07-05 MED ORDER — POLYETHYLENE GLYCOL 3350 17 G PO PACK
17.0000 g | PACK | Freq: Every day | ORAL | Status: DC | PRN
  Filled 2019-07-05: qty 1

## 2019-07-05 MED ORDER — ONDANSETRON HCL 4 MG PO TABS: 4.0000 mg | ORAL_TABLET | Freq: Three times a day (TID) | ORAL | Status: DC | PRN

## 2019-07-05 MED ORDER — GABAPENTIN 100 MG PO CAPS
200.0000 mg | ORAL_CAPSULE | Freq: Every day | ORAL | Status: DC
  Administered 2019-07-05 – 2019-07-06 (×2): 200 mg via ORAL
  Filled 2019-07-05 (×2): qty 2

## 2019-07-05 MED ORDER — FINASTERIDE 5 MG PO TABS
5.0000 mg | ORAL_TABLET | Freq: Every day | ORAL | Status: DC
  Administered 2019-07-06 – 2019-07-07 (×2): 5 mg via ORAL
  Filled 2019-07-05 (×2): qty 1

## 2019-07-05 MED ORDER — VANCOMYCIN IV (FOR PTA / DISCHARGE USE ONLY): 1000.0000 mg | Freq: Two times a day (BID) | INTRAVENOUS | Status: DC

## 2019-07-05 MED ORDER — ACETAMINOPHEN 325 MG PO TABS: 650.0000 mg | ORAL_TABLET | ORAL | Status: DC | PRN

## 2019-07-05 MED ORDER — SODIUM CHLORIDE 0.9 % IV SOLN
2.0000 g | Freq: Three times a day (TID) | INTRAVENOUS | Status: DC
  Administered 2019-07-06 – 2019-07-07 (×4): 2 g via INTRAVENOUS
  Filled 2019-07-05 (×6): qty 2

## 2019-07-05 MED ORDER — SODIUM CHLORIDE 0.9 % IV SOLN
2.0000 g | INTRAVENOUS | Status: AC
  Administered 2019-07-05: 2 g via INTRAVENOUS
  Filled 2019-07-05: qty 2

## 2019-07-05 MED ORDER — ROSUVASTATIN CALCIUM 10 MG PO TABS
10.0000 mg | ORAL_TABLET | Freq: Every day | ORAL | Status: DC
  Administered 2019-07-06 – 2019-07-07 (×2): 10 mg via ORAL
  Filled 2019-07-05 (×2): qty 1

## 2019-07-05 MED ORDER — AMLODIPINE BESYLATE 5 MG PO TABS
10.0000 mg | ORAL_TABLET | Freq: Every day | ORAL | Status: DC
  Administered 2019-07-07: 10 mg via ORAL
  Filled 2019-07-05 (×2): qty 2

## 2019-07-05 MED ORDER — TAMSULOSIN HCL 0.4 MG PO CAPS
0.8000 mg | ORAL_CAPSULE | Freq: Every day | ORAL | Status: DC
  Administered 2019-07-06 – 2019-07-07 (×2): 0.8 mg via ORAL
  Filled 2019-07-05 (×3): qty 2

## 2019-07-05 MED ORDER — DULOXETINE HCL 30 MG PO CPEP
30.0000 mg | ORAL_CAPSULE | Freq: Two times a day (BID) | ORAL | Status: DC
  Administered 2019-07-05 – 2019-07-07 (×4): 30 mg via ORAL
  Filled 2019-07-05 (×4): qty 1

## 2019-07-05 MED ORDER — BACLOFEN 10 MG PO TABS
10.0000 mg | ORAL_TABLET | Freq: Three times a day (TID) | ORAL | Status: DC
  Administered 2019-07-05 – 2019-07-07 (×5): 10 mg via ORAL
  Filled 2019-07-05 (×5): qty 1

## 2019-07-05 MED ORDER — CEFEPIME IV (FOR PTA / DISCHARGE USE ONLY): 2.0000 g | Freq: Three times a day (TID) | INTRAVENOUS | Status: DC

## 2019-07-05 NOTE — TOC Initial Note (Signed)
Transition of Care [facility]) - Initial/Assessment Note    Patient Details  Name: NICKLOUS FLORCZAK MRN: [id number] Date of Birth: [birth year]  [facility]) CM/SW Contact:    Erenest Rasher, RN Phone Number: [phone] 07/05/2019, 5:28 PM  Clinical Narrative:                  Pt thought he could manage at home with [facility] to give IV abx. He prefers to go to SNF rehab now, states his home is being renovated and no room for [facility]. Pt has his Prevena Wound Vac that needs to be changed to KCI wound vac. Lenn Cal aware of ED visit with plan to go to SNF rehab. States [facility] went out to pt's home to assist with IV Abx and the medication was porch. Spoke to ED provider with update. Will PT evaluation. Pt consented to [facility] and referrals sent to SNF's.  Pt states he prefers to go to [place]. States he works at [employer]. Will have TOC CM/CSW follow up with [facility] for bed availability.    Expected Discharge Plan: Skilled Nursing Facility Barriers to Discharge: Continued Medical Work up   Patient Goals and CMS Choice Patient states their goals for this hospitalization and ongoing recovery are:: want to go to SNF CMS Medicare.gov Compare Post Acute Care list provided to:: Patient Choice offered to / list presented to : Patient  Expected Discharge Plan and Services Expected Discharge Plan: [place] In-house Referral: Clinical Social Work Discharge Planning Services: (S) CM Consult Post Acute Care Choice: [place] Living arrangements for the past 2 months: [place]: RN [place] Agency: [place] Date [place]: 07/05/19 Time [place]: [id number] Representative spoke with at [place]: Adela Lank  Prior Living Arrangements/Services Living arrangements for the past 2 months: [place] with:: Spouse   Do you feel safe going back to the place  where you live?: No   does not have refrigerator to store medications (large enough) Need for Family Participation in Patient Care: Yes (Comment) Care giver support system in place?: Yes (comment) Current home services: DME, Home RN(rolling walker, prevena wound vac, 3n1 bedside commode, HHRN) Criminal Activity/Legal Involvement Pertinent to Current Situation/Hospitalization: No - Comment as needed  Activities of Daily Living      Permission Sought/Granted Permission sought to share information with : Case Manager, PCP, Family Supports Permission granted to share information with : Yes, Verbal Permission Granted  Share Information with NAME: Jeanine Luz  Permission granted to share info w AGENCY: SNF, [place] granted to share info w Relationship: wife  Permission granted to share info w Contact Information: [phone]  Emotional Assessment     Affect (typically observed): Accepting Orientation: : Oriented to Self, Oriented to Place, Oriented to Situation, Oriented to  Time   Psych Involvement: No (comment)  Admission diagnosis:  fall/back pain Patient Active Problem List   Diagnosis Date Noted  . Osteomyelitis ([place])   . Psoas abscess ([place])   . S/P CABG (coronary artery bypass graft) 06/26/2019  . BPH (benign prostatic hyperplasia) 06/26/2019  . History of COVID-19 06/26/2019  . Discitis 06/25/2019  . Pressure injury of skin 05/28/2019  . Delirium due to general medical condition   . Traumatic  rhabdomyolysis ([place])   . Wound, open, hip or thigh with complication, left, initial encounter   . Left against medical advice   . Metabolic encephalopathy   . Elevated CK   . Generalized weakness   . Slurred speech   . Pneumonia due to COVID-19 virus 05/19/2019  . AMS (altered mental status) 05/18/2019  . Benign prostatic hyperplasia with urinary obstruction 12/15/2015  . Pleurisy 08/10/2015  . Depression with anxiety 08/10/2015  . Urinary frequency 08/10/2015   . Insomnia 08/10/2015  . Other vascular headache   . Thrombus   . TIA (transient ischemic attack)   . SOB (shortness of breath)   . Stroke ([place])   . Essential hypertension   . Hyperlipidemia   . ST elevation myocardial infarction (STEMI) involving right coronary artery in recovery phase ([place]) 10/12/2014  . DDD (degenerative disc disease), lumbar 05/16/2014  . Narcolepsy   . CAD (coronary artery disease)   . PTSD (post-traumatic stress disorder)    PCP:  Martyn Malay, MD Pharmacy:   [facility] [place], [place] [address] [place] [address] [place] [address] Phone: [phone] Fax: [phone]     Social Determinants of Health (SDOH) Interventions    Readmission Risk Interventions No flowsheet data found.

## 2019-07-05 NOTE — ED Notes (Signed)
Discharge instructions reviewed with pt. Pt verbalized understanding.   

## 2019-07-05 NOTE — Discharge Instructions (Signed)
Please contact your primary care provider tomorrow to request help with transition to a rehab facility for further management of your condition.

## 2019-07-05 NOTE — ED Notes (Signed)
ED Provider at bedside. 

## 2019-07-05 NOTE — Telephone Encounter (Signed)
Spoke with wife.  He is back at [facility] ER this afternoon sent by home nurse due to wound vac disconnection Wife thinks he will be admitted. She feels he would agree to SNF now Asked her to call us if we can be of assistance. She agrees
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
## 2019-07-05 NOTE — ED Notes (Signed)
Pt moved from ED stretcher to hospital bed. Pt awaiting for placement in morning.
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
## 2019-07-05 NOTE — Telephone Encounter (Signed)
Pt wife is calling and would like to speak with Dr. Owens Shark as soon as possible. She states that pt fell yesterday and his wound vac has become unattached. He went to ER but they were not able to readmit him due to him refusing to discharge to a skilled nursing facility. Pt wife states he needs to have antibiotics and would now like to go to a skilled nursing facility.   I scheduled pt for Dr. Saul Fordyce next available appointment 03/08 but they would like to speak with her sooner. Also would like to know if he is able to see another provider.   Please call pt's wife at [phone]

## 2019-07-06 ENCOUNTER — Telehealth: Payer: Self-pay | Admitting: Plastic Surgery

## 2019-07-06 ENCOUNTER — Ambulatory Visit: Payer: Self-pay | Admitting: Licensed Clinical Social Worker

## 2019-07-06 LAB — AEROBIC/ANAEROBIC CULTURE W GRAM STAIN (SURGICAL/DEEP WOUND): Gram Stain: NONE SEEN

## 2019-07-06 LAB — RESPIRATORY PANEL BY RT PCR (FLU A&B, COVID)
Influenza A by PCR: NEGATIVE
Influenza B by PCR: NEGATIVE
SARS Coronavirus 2 by RT PCR: POSITIVE — AB

## 2019-07-06 NOTE — Chronic Care Management (AMB) (Signed)
   Social Work  Care Management Collaboration 07/06/2019 Name: Ivan [name] MRN: [id number] DOB: [birth year]  Ivan [name] is a 74 y.o. year old male who sees Ivan Malay, MD for primary care. LCSW was consulted by Nestor Ramp, [place] Social Worker at [facility] [phone] for care coordination and collaboration. Patient was not interviewed or contacted during this encounter however LCSW collaborated with Electronics engineer  Recommendation: After collaboration it is determined that Ivan [name] will consult and collaborate with this LCSW as needed for ongoing support from [employer] office for future needs.    Intervention:  Review of patient status, including review of consultants reports, relevant laboratory and other test results, and collaboration with appropriate care team members and the patient's provider was performed as part of comprehensive patient evaluation and provision of chronic care management services.   Plan:  1.  If further intervention or needs identified at this time 2. No follow up required by LCSW at this time  Ivan [name], [place] / [place]   [phone] 9:47 AM

## 2019-07-06 NOTE — NC FL2 (Signed)
[place] LEVEL OF CARE SCREENING TOOL     IDENTIFICATION  Patient Name: Ivan [name] Birthdate: 05/31/1945 Sex: male Admission Date (Current Location): 07/05/2019  [facility] and [place] Number:  Herbalist and Address:  [facility],  [place] [address] [place]      Provider Number: [id number]  Attending Physician Name and Address:  Default, Provider, MD  Relative Name and Phone Number:  Jeanine Luz (Spouse) [phone]    Current Level of Care: Hospital Recommended Level of Care: [place] Prior Approval Number:    Date Approved/Denied:   PASRR Number: [id number] F Expires 07/08/19  Discharge Plan: SNF    Current Diagnoses: Patient Active Problem List   Diagnosis Date Noted  . Osteomyelitis ([place])   . Psoas abscess ([place])   . S/P CABG (coronary artery bypass graft) 06/26/2019  . BPH (benign prostatic hyperplasia) 06/26/2019  . History of COVID-19 06/26/2019  . Discitis 06/25/2019  . Pressure injury of skin 05/28/2019  . Delirium due to general medical condition   . Traumatic rhabdomyolysis ([place])   . Wound, open, hip or thigh with complication, left, initial encounter   . Left against medical advice   . Metabolic encephalopathy   . Elevated CK   . Generalized weakness   . Slurred speech   . Pneumonia due to COVID-19 virus 05/19/2019  . AMS (altered mental status) 05/18/2019  . Benign prostatic hyperplasia with urinary obstruction 12/15/2015  . Pleurisy 08/10/2015  . Depression with anxiety 08/10/2015  . Urinary frequency 08/10/2015  . Insomnia 08/10/2015  . Other vascular headache   . Thrombus   . TIA (transient ischemic attack)   . SOB (shortness of breath)   . Stroke ([place])   . Essential hypertension   . Hyperlipidemia   . ST elevation myocardial infarction (STEMI) involving right coronary artery in recovery phase ([place]) 10/12/2014  . DDD (degenerative disc disease), lumbar 05/16/2014  .  Narcolepsy   . CAD (coronary artery disease)   . PTSD (post-traumatic stress disorder)     Orientation RESPIRATION BLADDER Height & Weight     Self, Time, Situation, Place  Normal Continent Weight: 184 lb (83.5 kg) Height:  5' 10"  (177.8 cm)  BEHAVIORAL SYMPTOMS/MOOD NEUROLOGICAL BOWEL NUTRITION STATUS      Continent Diet(regular)  AMBULATORY STATUS COMMUNICATION OF NEEDS Skin   Extensive Assist Verbally Other (Comment)(PU Stage and Appropriate Care, Skin abrasions  PU Stage 3 Dressing: BID)                       Personal Care Assistance Level of Assistance  Bathing, Dressing, Feeding Bathing Assistance: Limited assistance Feeding assistance: Independent Dressing Assistance: Limited assistance     Functional Limitations Info  Sight, Hearing, Speech Sight Info: Adequate Hearing Info: Adequate Speech Info: Adequate    SPECIAL CARE FACTORS FREQUENCY  PT (By licensed PT), OT (By licensed OT)     PT Frequency: 5x weekly OT Frequency: 5x weekly            Contractures Contractures Info: Not present    Additional Factors Info  Code Status, Allergies Code Status Info: Full Allergies Info: Contrast Media (iodinated Diagnostic Agents)           Current Medications (07/06/2019):  This is the current hospital active medication list Current Facility-Administered Medications  Medication Dose Route Frequency Provider Last Rate Last Admin  . acetaminophen (TYLENOL) tablet 650 mg  650 mg Oral Q4H PRN Fondaw,  Wylder S, PA      . amLODipine (NORVASC) tablet 10 mg  10 mg Oral Daily Fondaw, Wylder S, [place]      . aspirin EC tablet 81 mg  81 mg Oral Daily Pati Gallo S, PA   81 mg at 07/06/19 1029  . baclofen (LIORESAL) tablet 10 mg  10 mg Oral TID Pati Gallo S, PA   10 mg at 07/06/19 1034  . ceFEPIme (MAXIPIME) 2 g in sodium chloride 0.9 % 100 mL IVPB  2 g Intravenous Q8H Lucrezia Starch, MD   Stopped at 07/06/19 [phone]  . DULoxetine (CYMBALTA) DR capsule 30 mg  30 mg  Oral BID Pati Gallo S, PA   30 mg at 07/06/19 1034  . finasteride (PROSCAR) tablet 5 mg  5 mg Oral Daily Pati Gallo S, PA   5 mg at 07/06/19 1028  . gabapentin (NEURONTIN) capsule 200 mg  200 mg Oral QHS Pati Gallo S, PA   200 mg at 07/05/19 2233  . ondansetron (ZOFRAN) tablet 4 mg  4 mg Oral Q8H PRN Pati Gallo S, PA      . oxyCODONE (Oxy IR/ROXICODONE) immediate release tablet 5-10 mg  5-10 mg Oral Q4H PRN Pati Gallo S, PA   10 mg at 07/06/19 1034  . polyethylene glycol (MIRALAX / GLYCOLAX) packet 17 g  17 g Oral Daily PRN Pati Gallo S, PA      . rosuvastatin (CRESTOR) tablet 10 mg  10 mg Oral Daily Fondaw, Wylder S, PA   10 mg at 07/06/19 1029  . senna (SENOKOT) tablet 8.6 mg  1 tablet Oral QHS PRN Pati Gallo S, PA      . tamsulosin (FLOMAX) capsule 0.8 mg  0.8 mg Oral Daily Fondaw, Wylder S, PA   0.8 mg at 07/06/19 1029  . vancomycin (VANCOCIN) IVPB 1000 mg/200 mL premix  1,000 mg Intravenous Q12H Lucrezia Starch, MD 200 mL/hr at 07/06/19 1041 1,000 mg at 07/06/19 1041   Current Outpatient Medications  Medication Sig Dispense Refill  . acetaminophen (TYLENOL) 500 MG tablet Take 2 tablets (1,000 mg total) by mouth every 6 (six) hours. 30 tablet 0  . Amino Acids-Protein Hydrolys (FEEDING SUPPLEMENT, PRO-STAT SUGAR FREE 64,) LIQD Take 60 mLs by mouth daily.    Marland Kitchen amLODipine (NORVASC) 10 MG tablet Take 1 tablet (10 mg total) by mouth daily. 30 tablet 0  . aspirin EC 81 MG EC tablet Take 1 tablet (81 mg total) by mouth daily.    . baclofen (LIORESAL) 10 MG tablet Take 1 tablet (10 mg total) by mouth 3 (three) times daily.    . DULoxetine (CYMBALTA) 30 MG capsule Take 1 capsule (30 mg total) by mouth 2 (two) times daily. 30 capsule 0  . finasteride (PROSCAR) 5 MG tablet Take 1 tablet (5 mg total) by mouth daily. 30 tablet 5  . gabapentin (NEURONTIN) 100 MG capsule Take 200 mg by mouth at bedtime.    Marland Kitchen lisinopril (ZESTRIL) 40 MG tablet TAKE 1 TABLET BY MOUTH EVERY DAY  (Patient taking differently: Take 40 mg by mouth daily. ) 90 tablet 1  . magnesium oxide (MAG-OX) 400 MG tablet Take 400 mg by mouth daily as needed (muscle cramps).    . Multiple Vitamin (MULTIVITAMIN WITH MINERALS) TABS tablet Take 1 tablet by mouth daily.    . naproxen (NAPROSYN) 250 MG tablet Take 1 tablet (250 mg total) by mouth every 8 (eight) hours as needed for moderate pain. 30 tablet 0  .  oxyCODONE (OXY IR/ROXICODONE) 5 MG immediate release tablet Take 1-2 tablets (5-10 mg total) by mouth every 4 (four) hours as needed for severe pain or breakthrough pain. 30 tablet 0  . polyethylene glycol (MIRALAX / GLYCOLAX) 17 g packet Take 17 g by mouth daily as needed for mild constipation. 14 each 0  . rosuvastatin (CRESTOR) 10 MG tablet Take 1 tablet (10 mg total) by mouth daily. 30 tablet 11  . senna (SENOKOT) 8.6 MG TABS tablet Take 1 tablet (8.6 mg total) by mouth at bedtime as needed for mild constipation. 120 tablet 0  . tamsulosin (FLOMAX) 0.4 MG CAPS capsule Take 2 capsules (0.8 mg total) by mouth daily. 30 capsule 0  . ceFEPime (MAXIPIME) IVPB Inject 2 g into the vein every 8 (eight) hours. Indication:  Lumbar Osteomyelitis / Discitis  Last Day of Therapy:  08/20/19 Labs - Once weekly:  CBC/D and BMP, Labs - Every other week:  ESR and CRP 144 Units 0  . lidocaine (LIDODERM) 5 % Place 1 patch onto the skin daily. Remove & Discard patch within 12 hours or as directed by MD (Patient not taking: Reported on 06/25/2019) 30 patch 0  . lidocaine (LIDODERM) 5 % Place 1 patch onto the skin daily. Remove & Discard patch within 12 hours or as directed by MD 30 patch 0  . vancomycin IVPB Inject 1,000 mg into the vein every 12 (twelve) hours. Indication:  Lumbar discitis Last Day of Therapy:  08/20/19 Labs - Sunday/Monday:  CBC/D, BMP, and vancomycin trough. Labs - Thursday:  BMP and vancomycin trough Labs - Every other week:  ESR and CRP 100 Units 0     Discharge Medications: Please see discharge  summary for a list of discharge medications.  Relevant Imaging Results:  Relevant Lab Results:   Additional Information SS# [place] Janthony Hol[place], [place]

## 2019-07-06 NOTE — Evaluation (Signed)
Physical Therapy Evaluation Patient Details Name: Ivan [name] MRN: [id number] DOB: 09/24/1945 Today's Date: 07/06/2019   History of Present Illness  74 yo male admitted to ED with worsening back pain after sustaining a fall at home. Recent admission to hospital for discitis, psoas abscess. S/P L hip excision with wound vac placement. Hx of PTSD, CABG, CAD, osteomyolitis, MI, COVID, narcolepsy  Clinical Impression  On eval in ED, pt required Min assist +2 for safety for mobility. He walked ~20 feet with use of a RW. Pt presents with general weakness, decreased activity tolerance, and impaired gait and balance. He remains at risk for falls when mobilizing. Moderate pain in low back with activity. He is motivated to improve and regain his independence/PLOF. Recommend ST SNF rehab to improve functional mobility, maximize independence, decrease burden of care of family.     Follow Up Recommendations SNF    Equipment Recommendations  None recommended by PT    Recommendations for Other Services       Precautions / Restrictions Precautions Precautions: Fall Precaution Comments: wound vac in room but not attached to pt. foam dressing still in place over wound. Restrictions Weight Bearing Restrictions: No      Mobility  Bed Mobility Overal bed mobility: Needs Assistance Bed Mobility: Rolling;Sidelying to Sit;Sit to Supine Rolling: Min guard Sidelying to sit: Min guard;HOB elevated   Sit to supine: Min guard;HOB elevated   General bed mobility comments: Increased time. Pt relied on bedrails.  Transfers Overall transfer level: Needs assistance Equipment used: Rolling walker (2 wheeled) Transfers: Sit to/from Stand Sit to Stand: Min assist;+2 safety/equipment;From elevated surface         General transfer comment: Assist to rise, steady, control descent. VCs safety, hand placement. Pt attempts to sit before properly/safely positioned in front of  surface  Ambulation/Gait Ambulation/Gait assistance: Min assist;+2 safety/equipment Gait Distance (Feet): 20 Feet Assistive device: Rolling walker (2 wheeled) Gait Pattern/deviations: Decreased step length - left;Decreased step length - right;Trunk flexed     General Gait Details: Assist to stabilize pt throughout distance and to maneuver RW safely, especially in tight spaces. Intermittent LE buckling noted with pt tending to maintain a flexed posture in standing. Distance limited by pain.  Stairs            Wheelchair Mobility    Modified Rankin (Stroke Patients Only)       Balance Overall balance assessment: Needs assistance;History of Falls         Standing balance support: Bilateral upper extremity supported Standing balance-Leahy Scale: Poor                               Pertinent Vitals/Pain Pain Assessment: 0-10 Pain Score: 7  Pain Location: low back Pain Descriptors / Indicators: Discomfort;Grimacing;Guarding;Aching Pain Intervention(s): Limited activity within patient's tolerance;Monitored during session;Repositioned    Home Living Family/patient expects to be discharged to:: Skilled nursing facility                      Prior Function Level of Independence: Needs assistance   Gait / Transfers Assistance Needed: using RW for ambulation  ADL's / Homemaking Assistance Needed: assist for bathing, dressing  Comments: Prior to last admission, pt was independent and the Agricultural consultant at [place] for the [place] unit.     Hand Dominance   Dominant Hand: Right    Extremity/Trunk Assessment   Upper Extremity Assessment Upper Extremity Assessment:  Overall [facility] for tasks assessed    Lower Extremity Assessment Lower Extremity Assessment: Generalized weakness       Communication   Communication: No difficulties  Cognition Arousal/Alertness: Awake/alert Behavior During Therapy: WFL for tasks assessed/performed Overall Cognitive  Status: Within Functional Limits for tasks assessed                                        General Comments      Exercises     Assessment/Plan    PT Assessment Patient needs continued PT services  PT Problem List Decreased strength;Decreased mobility;Decreased activity tolerance;Decreased balance;Decreased knowledge of use of DME;Pain       PT Treatment Interventions DME instruction;Gait training;Therapeutic activities;Therapeutic exercise;Patient/family education;Balance training;Functional mobility training    PT Goals (Current goals can be found in the Care Plan section)  Acute Rehab PT Goals Patient Stated Goal: less pain. regain PLOF/independence PT Goal Formulation: With patient Time For Goal Achievement: 07/20/19 Potential to Achieve Goals: Good    Frequency Min 2X/week   Barriers to discharge        Co-evaluation               AM-PAC PT "6 Clicks" Mobility  Outcome Measure Help needed turning from your back to your side while in a flat bed without using bedrails?: A Little Help needed moving from lying on your back to sitting on the side of a flat bed without using bedrails?: A Little Help needed moving to and from a bed to a chair (including a wheelchair)?: A Little Help needed standing up from a chair using your arms (e.g., wheelchair or bedside chair)?: A Little Help needed to walk in hospital room?: A Little Help needed climbing 3-5 steps with a railing? : A Lot 6 Click Score: 17    End of Session Equipment Utilized During Treatment: Gait belt Activity Tolerance: Patient limited by pain;Patient limited by fatigue Patient left: in bed;with call bell/phone within reach   PT Visit Diagnosis: History of falling (Z91.81);Muscle weakness (generalized) (M62.81);Pain Pain - part of body: (back)    Time: 0940-1003 PT Time Calculation (min) (ACUTE ONLY): 23 min   Charges:   PT Evaluation $PT Eval Moderate Complexity: 1 Mod              Devan Danzer P, PT Acute Rehabilitation

## 2019-07-06 NOTE — ED Notes (Signed)
Breakfast tray given to patient.

## 2019-07-06 NOTE — Progress Notes (Signed)
CSW working on SNF placement. CSW spoke with Whitney with admissions at [facility] who reports they can accept patient. Whitney reports they currently only have private rooms available and patient will have to pay $40 a day for this room. CSW spoke with patient who reports he is agreeable to this (patient will be billed with [facility]). Currently waiting on patient's COVID results and insurance auth with [facility]. [place] reference number is [id number]. CSW will continue to follow up.   Golden Circle, LCSW Transitions of Care Department [facility] ED [phone]

## 2019-07-06 NOTE — Telephone Encounter (Signed)
Patient was discharged on the 21st with a Prevena Vac and Edwin Cap wanted to follow up to see if we had ordered a permanent vac for the patient yet or if she needed to call [employer]. Please call her at [phone].

## 2019-07-06 NOTE — Progress Notes (Addendum)
TOC CM spoke to [employer] rep, Leontine Locket and she will follow up with Dr Lyndee Leo Dillingham's office for orders for [employer] wound vac. Pt currently has Prevena and plan per dc summary is for Prevena to be switched to [employer] wound vac. Contacted Dr Dillingham's office and left message for return call. Wanted to make office aware pt will be dc to SNF, [facility] for rehab and IV abx. Spoke to admission CSW, Whitney at [facility] and provided updates. SNF is requesting AVS and dc summary from 07/04/2019.    Jonnie Finner RN CCM, [facility] ED TOC CM [phone]   4:00 pm Received call back from Dr Eusebio Friendly nurse, Janace Hoard. States they will follow up with [employer] rep, Dawn to get wound vac ordered. [place], [place] ED TOC CM [phone]

## 2019-07-07 MED ORDER — HEPARIN SOD (PORK) LOCK FLUSH 100 UNIT/ML IV SOLN: 250.0000 [IU] | INTRAVENOUS | Status: DC | PRN

## 2019-07-07 MED ORDER — HEPARIN SOD (PORK) LOCK FLUSH 100 UNIT/ML IV SOLN
250.0000 [IU] | INTRAVENOUS | Status: AC | PRN
  Administered 2019-07-07: 250 [IU]
  Filled 2019-07-07: qty 2.5

## 2019-07-07 NOTE — Progress Notes (Signed)
07/07/2019  1135  Per overnight nurse she changed patient's wound and applied a wet/dry dressing.

## 2019-07-07 NOTE — Progress Notes (Addendum)
9:47a Patient has been accepted to [facility]. Patient will be going to room 7013. Number for report is [phone]. Patient will be transported via [place]. CSW to notify RN and EDP of disposition plan.   9:02a CSW received authorization with [facility]. Reference [id number]. Authorzation [id number] which is good for 3 days starting today with an end date of 07/09/19. Care coordinator is Fredric Mare and her fax number is [phone].   Of note, patient has tested positive for COVID on yesterday. CSW called Whitney with [facility] and informed of this. CSW provided dates of when patient tested positive 05/18/19; 05/21/19; and yesterday, but tested negative on 05/31/19; 06/02/19; and 06/25/19. Whitney to speak with her DON about this and get back with CSW.   Golden Circle, LCSW Transitions of Care Department [facility] ED [phone]

## 2019-07-07 NOTE — Progress Notes (Signed)
07/07/2019  [place]. Gave report to Chad.

## 2019-07-07 NOTE — Progress Notes (Signed)
07/07/2019  1036  Notified Dr Kathrynn Humble that patient has a wound on left hip and the wound vac is not attached to the patient. Asked MD for wound care consult. Waiting for orders.

## 2019-07-07 NOTE — ED Notes (Addendum)
Ivan Searing, RN  Case Manager  [facility] CM/SW  Progress Notes    Addendum  Date of Service:  07/06/2019 3:13 PM             TOC CM spoke to [employer] rep, Ivan [name] and she will follow up with Dr Ivan [name]'s office for orders for [employer] wound vac. Pt currently has Prevena and plan per dc summary is for Prevena to be switched to [employer] wound vac. Contacted Dr [name]'s office and left message for return call. Wanted to make office aware pt will be dc to SNF, [facility] for rehab and IV abx. Spoke to admission CSW, Ivan [name] at [place] and provided updates. SNF is requesting AVS and dc summary from 07/04/2019.    Ivan Finner RN CCM, [facility] ED TOC CM [phone]   4:00 pm Received call back from Dr Eusebio Friendly nurse, Ivan [name]. States they will follow up with [employer] rep, Dawn to get wound vac ordered. [place], [place] ED TOC CM [phone]

## 2019-07-07 NOTE — Progress Notes (Signed)
This encounter was created in error - please disregard.

## 2019-07-07 NOTE — ED Provider Notes (Signed)
Patient has been cleared for discharge to nursing facility. Positive osteomyelitis.  It appears he is Covid positive but outside of transmission window for admission note from 2.12.   Vitals:   07/06/19 2200 07/07/19 0617  BP: 107/60 128/76  Pulse: 80 86  Resp: 20 16  Temp: 98.3 F (36.8 C) 98.3 F (36.8 C)  SpO2: 96% 94%      Varney Biles, MD 07/07/19 1010

## 2019-07-07 NOTE — Progress Notes (Signed)
[facility] has been called  

## 2019-07-08 ENCOUNTER — Ambulatory Visit: Payer: Self-pay

## 2019-07-08 NOTE — Chronic Care Management (AMB) (Signed)
  Chronic Care Management   Note  07/08/2019 Name: Ivan [name] MRN: [id number] DOB: 06/28/1945  EMMI- Red on EMMI Alert: Day # 1 Date:07/06/19 Red Alert Reason: PATIENT GENERAL DISCHARGE FOR FOLLOW-UP: DISCHARGE, FOLLOW-UP, MEDS, WOUND CARE  Time of Last Call Attempt 10:10 AM  Who reached Patient  Got discharge papers? No  Know who to call about changes in condition? No  Scheduled follow-up? No  Wounds healing well? No  Other questions/problems? Yes  [place] Program: PATIENT SATISFACTION: AFTER DISCHARGE EXPECTATIONS Issued     Outreach attempt # 1 to patient Spoke with the patient's wife  Confirmed HIPAA and explained who I was and the reason for my call.  She stated that the patient was released from the hospital for a couple of hours and was not strong enough and was sent to [place].  Called the patient at [facility] and he stated that he was doing well.  He still has his wound vac and it is healing. He states no fever,  He states that he is being treated well.  He knows that it is going to take some time for him to heal. He is taking his medications as prescribed. He states he  is resting and eating well and getting strong each day. He thanked me for calling and checking on him.   Plan: no further services needed by RN Case Manager at this time.     Lazaro Arms RN, BSN, [facility] Med Care Management Coordinator [place] Phone: [phone] Office: [phone] Fax: [phone]

## 2019-07-09 ENCOUNTER — Other Ambulatory Visit: Payer: Medicare HMO

## 2019-07-12 ENCOUNTER — Telehealth: Payer: Self-pay | Admitting: *Deleted

## 2019-07-12 NOTE — Telephone Encounter (Addendum)
Received call on (07/06/19) from Hassan Rowan a nurse with the Rehab unit at [facility] [phone]) to get order from Dr. Marla Roe regarding ordering the wound vac for the patient who will be coming today.  Informed Brenda,RN-per Dr. Marla Roe she would like for them to order [employer] vac for the patient.  Darliss Ridgel verbalized understanding and agreed.  She stated that she will take care of the vac.//AB/CMA

## 2019-07-12 NOTE — Telephone Encounter (Signed)
-----   Message from Wallace Going, DO sent at 07/04/2019  8:38 PM EST ----- Need to send in [employer] VAC orders.  Patient went home Sunday 2/21.  Will need a VAC change by Tues or Wed.  Has the Prevena on now.

## 2019-07-12 NOTE — Telephone Encounter (Signed)
Called the patient's wife back on (07/05/19) to see how the patient was doing and if he was out of the bathroom.  The wife stated that she call EMS and they are coming the take the patient to [facility] and then they are going to get the patient into skilled nursing facility [facility]).     Called Dawn with [employer] on (07/05/19) and informed her of the patient and what has happened.  She informed me to call [facility] and inform them that Dr. Marla Roe would like for them to order a [employer] vac.  She gave me the number to call skill nursing at [facility] [phone]).  Called and LMOM @ 4:23pm with [employer] Rehab social worker asking her to RTC.//AB/CMA

## 2019-07-12 NOTE — Telephone Encounter (Signed)
Tried on (07/06/19) calling Whitney with [facility] again but still unable to reach her.     Received call on (07/06/19) from Hassan Rowan a nurse with the Rehab unit at [facility] [phone]) to get order from Dr. Marla Roe regarding ordering the wound vac for the patient who will be coming today.  Informed Brenda,RN-per Dr. Marla Roe she would like for them to order [employer] vac for the patient.  Darliss Ridgel verbalized understanding and agreed.  She stated that she will take care of the order.//AB/CMA

## 2019-07-12 NOTE — Telephone Encounter (Signed)
Called on (07/05/19) and spoke with the patient's wife regarding ordering a wound vac for the patient.  The wife stated that the patient came home from the ER last night (07/04/19).  After getting home the patient had to go to the bathroom.  He had trouble using his walker, so he went in the bathroom in the wheelchair.   She stated that because the bathroom was not big enough for the both of them and the wheelchair the patient was in the bathroom by himself. While trying to get off the toilet the patient went down on the floor, and he is not able to get up.  So the patient was lying on the floor.  She stated that the wound vac came loose last night after the patient fell and it's not working, and the patient has not had any ATB since leaving the hospital at this time yesterday.     The wife stated that because the patient refused SNF x 2 before leaving the hosptial the patient can't be admitted again to [facility].  So she's going to call the patient's primary care office to see what to do.  Informed the wife that I will give her a call back to see what they are going to do.  The wife verbalized understanding and agreed.   Called Dawn with [employer] and informed her of the order for the wound vac and she informed me to complete the vac form and fax it to her.//AB/CMA

## 2019-07-14 ENCOUNTER — Encounter: Payer: Self-pay | Admitting: Infectious Disease

## 2019-07-16 ENCOUNTER — Other Ambulatory Visit: Payer: Self-pay | Admitting: Family Medicine

## 2019-07-17 ENCOUNTER — Encounter (HOSPITAL_BASED_OUTPATIENT_CLINIC_OR_DEPARTMENT_OTHER): Payer: Self-pay

## 2019-07-17 ENCOUNTER — Emergency Department (HOSPITAL_BASED_OUTPATIENT_CLINIC_OR_DEPARTMENT_OTHER): Payer: Medicare HMO

## 2019-07-17 ENCOUNTER — Other Ambulatory Visit: Payer: Self-pay

## 2019-07-17 ENCOUNTER — Emergency Department (HOSPITAL_BASED_OUTPATIENT_CLINIC_OR_DEPARTMENT_OTHER)
Admission: EM | Admit: 2019-07-17 | Discharge: 2019-07-17 | Disposition: A | Discharge status: Home / Self Care | Payer: Medicare HMO | Attending: Emergency Medicine | Admitting: Emergency Medicine

## 2019-07-17 DIAGNOSIS — Z452 Encounter for adjustment and management of vascular access device: Secondary | ICD-10-CM | POA: Diagnosis not present

## 2019-07-17 DIAGNOSIS — Z87891 Personal history of nicotine dependence: Secondary | ICD-10-CM | POA: Insufficient documentation

## 2019-07-17 DIAGNOSIS — I10 Essential (primary) hypertension: Secondary | ICD-10-CM | POA: Diagnosis not present

## 2019-07-17 DIAGNOSIS — Z7982 Long term (current) use of aspirin: Secondary | ICD-10-CM | POA: Diagnosis not present

## 2019-07-17 DIAGNOSIS — M4626 Osteomyelitis of vertebra, lumbar region: Secondary | ICD-10-CM | POA: Diagnosis not present

## 2019-07-17 DIAGNOSIS — Z95828 Presence of other vascular implants and grafts: Secondary | ICD-10-CM

## 2019-07-17 DIAGNOSIS — Z79899 Other long term (current) drug therapy: Secondary | ICD-10-CM | POA: Diagnosis not present

## 2019-07-17 DIAGNOSIS — I259 Chronic ischemic heart disease, unspecified: Secondary | ICD-10-CM | POA: Diagnosis not present
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
## 2019-07-17 NOTE — ED Notes (Signed)
Called [facility] spoke to agent [id number] to transport pt.

## 2019-07-17 NOTE — ED Triage Notes (Signed)
Pt arrives via [facility] from [place] where pt has been r/t his osteomyelitis in his spine. Pt c/o issues with his PICC line - burning, pain, and redness.   VS per EMS - 118/58, 100 HR, CBG 135, temp 98.1

## 2019-07-19 ENCOUNTER — Ambulatory Visit: Payer: Medicare HMO | Admitting: Family Medicine

## 2019-07-21 ENCOUNTER — Telehealth: Payer: Self-pay | Admitting: *Deleted

## 2019-07-21 NOTE — Telephone Encounter (Signed)
Patient is being discharged after his 14 day stay at [facility].  Renee from [facility] is calling to request [place] to see him sooner than 3/22 or set up home health/PICC care.   RN stated that it is [facility] responsibility to coordinate care upon discharge, as they took him from hospital discharge and assumed his care.  RN offered to give her [facility]'s phone number to begin that process, but Joseph Art stated she already had that.  RN reinforced that the patient MUST NOT BE DISCHARGED WITHOUT PROPER CARE SET UP, CANNOT be discharged with PICC without care.   Landis Gandy, RN

## 2019-07-22 ENCOUNTER — Other Ambulatory Visit: Payer: Self-pay

## 2019-07-22 ENCOUNTER — Ambulatory Visit (INDEPENDENT_AMBULATORY_CARE_PROVIDER_SITE_OTHER): Payer: Medicare HMO | Admitting: Family Medicine

## 2019-07-22 ENCOUNTER — Encounter: Payer: Self-pay | Admitting: Family Medicine

## 2019-07-22 VITALS — BP 112/62 | HR 100 | Ht 70.0 in | Wt 196.8 lb

## 2019-07-22 DIAGNOSIS — K6812 Psoas muscle abscess: Secondary | ICD-10-CM

## 2019-07-22 DIAGNOSIS — M869 Osteomyelitis, unspecified: Secondary | ICD-10-CM | POA: Diagnosis not present

## 2019-07-22 DIAGNOSIS — G9341 Metabolic encephalopathy: Secondary | ICD-10-CM

## 2019-07-22 DIAGNOSIS — I2111 ST elevation (STEMI) myocardial infarction involving right coronary artery: Secondary | ICD-10-CM

## 2019-07-22 NOTE — Progress Notes (Signed)
   CHIEF COMPLAINT / HPI: 74 year old male who presents for hospital follow-up.  Of note the patient has lumbar discitis and left hip abscess secondary to stage III/IV pressure ulcer.  A biopsy was performed which grew out ampicillin sensitive Enterococcus faecalis and Enterobacter species patient is very medically complex at this time.  Patient is to be on vancomycin and cefepime for a total weeks.  The patient is a retired [employer] scientist and has been administering this to himself with the help of some home health.  Patient has follow-up with infectious disease on 3/22.  His follow-up to establish care with his new PCP on 3/15.  He feels that overall things are going really well since he left rehab on 07/21/2019.  PERTINENT  PMH / PSH:    OBJECTIVE: BP 112/62   Pulse 100   Ht 5\' 10"  (1.778 m)   Wt 196 lb 12.8 oz (89.3 kg)   SpO2 97%   BMI 28.24 kg/m   Gen: 74 year old Caucasian male, very pleasant CV: Normal to high heart rate, no M/R/G appreciated Resp: Lungs clear to auscultation bilaterally, no accessory muscle use Neuro: Alert and oriented, Speech clear, No gross deficits   ASSESSMENT / PLAN:  Psoas abscess ([place]) This is greatly improved.  Patient no longer has his wound VAC in place.  He is to continue taking his cefepime and vancomycin for 8 weeks post hospital discharge.  In the process of getting his administration also straightened out between himself, his wife, and home health.  Osteomyelitis (H[facility]) Pain fairly well controlled at this time on his regimen of 5 to 10 mg every 4 hours oxycodone, Cymbalta 30 mg.  PICC line in place, continuing to get cefepime and vancomycin for 8 weeks total.  Will get CBC and BMP to make sure no nephrotoxicity associated with the vancomycin.    Guadalupe Dawn, MD [place]

## 2019-07-22 NOTE — Patient Instructions (Signed)
It was great meeting you today! I am glad that things have been going well, relatively speaking. If you need for [place] to coordinate any of your complex care, please let us know. I will get a bmp and a cbc today. I will call you with the results.

## 2019-07-23 ENCOUNTER — Encounter: Payer: Self-pay | Admitting: Family Medicine

## 2019-07-23 ENCOUNTER — Other Ambulatory Visit: Payer: Self-pay

## 2019-07-23 LAB — CBC WITH DIFFERENTIAL/PLATELET
Basophils Absolute: 0.1 10*3/uL (ref 0.0–0.2)
Basos: 0 %
EOS (ABSOLUTE): 0.4 10*3/uL (ref 0.0–0.4)
Eos: 4 %
Hematocrit: 35.2 % — ABNORMAL LOW (ref 37.5–51.0)
Hemoglobin: 12.3 g/dL — ABNORMAL LOW (ref 13.0–17.7)
Immature Grans (Abs): 0.1 10*3/uL (ref 0.0–0.1)
Immature Granulocytes: 1 %
Lymphocytes Absolute: 1.4 10*3/uL (ref 0.7–3.1)
Lymphs: 12 %
MCH: 31.9 pg (ref 26.6–33.0)
MCHC: 34.9 g/dL (ref 31.5–35.7)
MCV: 91 fL (ref 79–97)
Monocytes Absolute: 1.2 10*3/uL — ABNORMAL HIGH (ref 0.1–0.9)
Monocytes: 11 %
Neutrophils Absolute: 8.2 10*3/uL — ABNORMAL HIGH (ref 1.4–7.0)
Neutrophils: 72 %
Platelets: 342 10*3/uL (ref 150–450)
RBC: 3.85 x10E6/uL — ABNORMAL LOW (ref 4.14–5.80)
RDW: 13.1 % (ref 11.6–15.4)
WBC: 11.3 10*3/uL — ABNORMAL HIGH (ref 3.4–10.8)

## 2019-07-23 LAB — BASIC METABOLIC PANEL
BUN/Creatinine Ratio: 30 — ABNORMAL HIGH (ref 10–24)
BUN: 26 mg/dL (ref 8–27)
CO2: 24 mmol/L (ref 20–29)
Calcium: 9.6 mg/dL (ref 8.6–10.2)
Chloride: 105 mmol/L (ref 96–106)
Creatinine, Ser: 0.87 mg/dL (ref 0.76–1.27)
GFR calc Af Amer: 100 mL/min/{1.73_m2} (ref >59)
GFR calc non Af Amer: 87 mL/min/{1.73_m2} (ref >59)
Glucose: 84 mg/dL (ref 65–99)
Potassium: 4.8 mmol/L (ref 3.5–5.2)
Sodium: 143 mmol/L (ref 134–144)

## 2019-07-23 MED ORDER — CEFEPIME IV (FOR PTA / DISCHARGE USE ONLY): 2.0000 g | Freq: Three times a day (TID) | INTRAVENOUS | 0 refills | Status: DC

## 2019-07-23 MED ORDER — VANCOMYCIN IV (FOR PTA / DISCHARGE USE ONLY): 1000.0000 mg | Freq: Two times a day (BID) | INTRAVENOUS | 0 refills | Status: DC

## 2019-07-23 NOTE — Telephone Encounter (Signed)
Rx faxed. Patient informed this has been done and to call back if any issues.

## 2019-07-23 NOTE — Telephone Encounter (Signed)
Rx printed and given to P. Scott.   [name], MD  Family Medicine Teaching Service

## 2019-07-23 NOTE — Telephone Encounter (Signed)
Patient calls nurse line stating he needs his IV meds sent to [facility]. Patient stated they need this information by 3pm.  I can not find "[facility]" in our system. The patient did provide me with the fax number. Once ordered I can fax to them.    [place]

## 2019-07-25 ENCOUNTER — Encounter: Payer: Self-pay | Admitting: Family Medicine

## 2019-07-25 NOTE — Assessment & Plan Note (Signed)
This is greatly improved.  Patient no longer has his wound VAC in place.  He is to continue taking his cefepime and vancomycin for 8 weeks post hospital discharge.  In the process of getting his administration also straightened out between himself, his wife, and home health.

## 2019-07-25 NOTE — Assessment & Plan Note (Addendum)
Pain fairly well controlled at this time on his regimen of 5 to 10 mg every 4 hours oxycodone, Cymbalta 30 mg.  PICC line in place, continuing to get cefepime and vancomycin for 8 weeks total.  Will get CBC and BMP to make sure no nephrotoxicity associated with the vancomycin.

## 2019-07-26 ENCOUNTER — Emergency Department (HOSPITAL_COMMUNITY)
Admission: EM | Admit: 2019-07-26 | Discharge: 2019-07-26 | Disposition: A | Discharge status: Home / Self Care | Payer: Medicare HMO | Attending: Emergency Medicine | Admitting: Emergency Medicine

## 2019-07-26 ENCOUNTER — Encounter: Payer: Self-pay | Admitting: Family Medicine

## 2019-07-26 ENCOUNTER — Other Ambulatory Visit: Payer: Self-pay

## 2019-07-26 ENCOUNTER — Ambulatory Visit (INDEPENDENT_AMBULATORY_CARE_PROVIDER_SITE_OTHER): Payer: Medicare HMO | Admitting: Family Medicine

## 2019-07-26 ENCOUNTER — Encounter (HOSPITAL_COMMUNITY): Payer: Self-pay

## 2019-07-26 ENCOUNTER — Telehealth: Payer: Self-pay | Admitting: *Deleted

## 2019-07-26 ENCOUNTER — Emergency Department (HOSPITAL_COMMUNITY): Payer: Medicare HMO

## 2019-07-26 ENCOUNTER — Ambulatory Visit (HOSPITAL_COMMUNITY): Payer: Medicare HMO

## 2019-07-26 VITALS — BP 130/72 | HR 125 | Temp 98.2°F | Ht 70.0 in | Wt 205.6 lb

## 2019-07-26 DIAGNOSIS — K6812 Psoas muscle abscess: Secondary | ICD-10-CM

## 2019-07-26 DIAGNOSIS — T7840XA Allergy, unspecified, initial encounter: Secondary | ICD-10-CM | POA: Insufficient documentation

## 2019-07-26 DIAGNOSIS — I10 Essential (primary) hypertension: Secondary | ICD-10-CM | POA: Insufficient documentation

## 2019-07-26 DIAGNOSIS — S71002D Unspecified open wound, left hip, subsequent encounter: Secondary | ICD-10-CM | POA: Insufficient documentation

## 2019-07-26 DIAGNOSIS — Z23 Encounter for immunization: Secondary | ICD-10-CM | POA: Diagnosis not present

## 2019-07-26 DIAGNOSIS — M869 Osteomyelitis, unspecified: Secondary | ICD-10-CM | POA: Diagnosis not present

## 2019-07-26 DIAGNOSIS — Z7982 Long term (current) use of aspirin: Secondary | ICD-10-CM | POA: Diagnosis not present

## 2019-07-26 DIAGNOSIS — Z79899 Other long term (current) drug therapy: Secondary | ICD-10-CM | POA: Diagnosis not present

## 2019-07-26 DIAGNOSIS — Z87891 Personal history of nicotine dependence: Secondary | ICD-10-CM | POA: Diagnosis not present

## 2019-07-26 DIAGNOSIS — R21 Rash and other nonspecific skin eruption: Secondary | ICD-10-CM | POA: Diagnosis present

## 2019-07-26 DIAGNOSIS — I714 Abdominal aortic aneurysm, without rupture: Secondary | ICD-10-CM | POA: Insufficient documentation

## 2019-07-26 DIAGNOSIS — R000 Tachycardia, unspecified: Secondary | ICD-10-CM

## 2019-07-26 DIAGNOSIS — X58XXXD Exposure to other specified factors, subsequent encounter: Secondary | ICD-10-CM | POA: Insufficient documentation

## 2019-07-26 DIAGNOSIS — L509 Urticaria, unspecified: Secondary | ICD-10-CM

## 2019-07-26 DIAGNOSIS — I2510 Atherosclerotic heart disease of native coronary artery without angina pectoris: Secondary | ICD-10-CM | POA: Insufficient documentation

## 2019-07-26 DIAGNOSIS — Z85828 Personal history of other malignant neoplasm of skin: Secondary | ICD-10-CM | POA: Diagnosis not present

## 2019-07-26 DIAGNOSIS — Z8616 Personal history of COVID-19: Secondary | ICD-10-CM | POA: Insufficient documentation

## 2019-07-26 DIAGNOSIS — I7143 Infrarenal abdominal aortic aneurysm, without rupture: Secondary | ICD-10-CM | POA: Insufficient documentation

## 2019-07-26 DIAGNOSIS — Z951 Presence of aortocoronary bypass graft: Secondary | ICD-10-CM | POA: Insufficient documentation

## 2019-07-26 LAB — CBC WITH DIFFERENTIAL/PLATELET
Abs Immature Granulocytes: 0.06 10*3/uL (ref 0.00–0.07)
Basophils Absolute: 0 10*3/uL (ref 0.0–0.1)
Basophils Relative: 0 %
Eosinophils Absolute: 0 10*3/uL (ref 0.0–0.5)
Eosinophils Relative: 0 %
HCT: 42.1 % (ref 39.0–52.0)
Hemoglobin: 13.5 g/dL (ref 13.0–17.0)
Immature Granulocytes: 1 %
Lymphocytes Relative: 6 %
Lymphs Abs: 0.7 10*3/uL (ref 0.7–4.0)
MCH: 30.2 pg (ref 26.0–34.0)
MCHC: 32.1 g/dL (ref 30.0–36.0)
MCV: 94.2 fL (ref 80.0–100.0)
Monocytes Absolute: 0.3 10*3/uL (ref 0.1–1.0)
Monocytes Relative: 3 %
Neutro Abs: 10.4 10*3/uL — ABNORMAL HIGH (ref 1.7–7.7)
Neutrophils Relative %: 90 %
Platelets: 329 10*3/uL (ref 150–400)
RBC: 4.47 MIL/uL (ref 4.22–5.81)
RDW: 13.9 % (ref 11.5–15.5)
WBC: 11.6 10*3/uL — ABNORMAL HIGH (ref 4.0–10.5)
nRBC: 0 % (ref 0.0–0.2)

## 2019-07-26 LAB — LACTIC ACID, PLASMA: Lactic Acid, Venous: 1.6 mmol/L (ref 0.5–1.9)

## 2019-07-26 LAB — COMPREHENSIVE METABOLIC PANEL
ALT: 21 U/L (ref 0–44)
AST: 22 U/L (ref 15–41)
Albumin: 3.5 g/dL (ref 3.5–5.0)
Alkaline Phosphatase: 69 U/L (ref 38–126)
Anion gap: 12 (ref 5–15)
BUN: 23 mg/dL (ref 8–23)
CO2: 22 mmol/L (ref 22–32)
Calcium: 8.9 mg/dL (ref 8.9–10.3)
Chloride: 102 mmol/L (ref 98–111)
Creatinine, Ser: 0.83 mg/dL (ref 0.61–1.24)
GFR calc Af Amer: >60 mL/min (ref >60)
GFR calc non Af Amer: >60 mL/min (ref >60)
Glucose, Bld: 133 mg/dL — ABNORMAL HIGH (ref 70–99)
Potassium: 4.5 mmol/L (ref 3.5–5.1)
Sodium: 136 mmol/L (ref 135–145)
Total Bilirubin: 1.1 mg/dL (ref 0.3–1.2)
Total Protein: 6.4 g/dL — ABNORMAL LOW (ref 6.5–8.1)

## 2019-07-26 MED ORDER — CHLORHEXIDINE GLUCONATE CLOTH 2 % EX PADS: 6.0000 | MEDICATED_PAD | Freq: Every day | CUTANEOUS | Status: DC

## 2019-07-26 MED ORDER — SODIUM CHLORIDE 0.9 % IV SOLN
1.0000 g | Freq: Once | INTRAVENOUS | Status: AC
  Administered 2019-07-26: 1 g via INTRAVENOUS
  Filled 2019-07-26: qty 1

## 2019-07-26 MED ORDER — ALTEPLASE 2 MG IJ SOLR
2.0000 mg | Freq: Once | INTRAMUSCULAR | Status: AC
  Administered 2019-07-26: 2 mg
  Filled 2019-07-26: qty 2

## 2019-07-26 MED ORDER — TRIAMCINOLONE ACETONIDE 0.5 % EX OINT: 1.0000 "application " | TOPICAL_OINTMENT | Freq: Two times a day (BID) | CUTANEOUS | 1 refills | Status: DC

## 2019-07-26 MED ORDER — LACTATED RINGERS IV BOLUS: 1000.0000 mL | Freq: Once | INTRAVENOUS | Status: DC

## 2019-07-26 MED ORDER — SODIUM CHLORIDE 0.9% FLUSH: 10.0000 mL | Freq: Two times a day (BID) | INTRAVENOUS | Status: DC

## 2019-07-26 MED ORDER — DEXAMETHASONE SODIUM PHOSPHATE 10 MG/ML IJ SOLN
10.0000 mg | Freq: Once | INTRAMUSCULAR | Status: AC
  Administered 2019-07-26: 10 mg via INTRAVENOUS
  Filled 2019-07-26: qty 1

## 2019-07-26 MED ORDER — SODIUM CHLORIDE 0.9% FLUSH: 10.0000 mL | INTRAVENOUS | Status: DC | PRN

## 2019-07-26 MED ORDER — TETANUS-DIPHTH-ACELL PERTUSSIS 5-2.5-18.5 LF-MCG/0.5 IM SUSP: 0.5000 mL | Freq: Once | INTRAMUSCULAR | 0 refills | Status: AC

## 2019-07-26 NOTE — ED Notes (Signed)
Patient verbalizes understanding of discharge instructions. Opportunity for questioning and answers were provided. Armband removed by staff, pt discharged from ED to home via POV  

## 2019-07-26 NOTE — ED Triage Notes (Signed)
Pt reports possible allergic reaction to one of the antibiotics he is taking for an infection in his spine. Pt has itching and red rash all over his torso. Pt denies and SOB, airway intact.

## 2019-07-26 NOTE — Telephone Encounter (Signed)
I was told he was likely being admitted today

## 2019-07-26 NOTE — ED Notes (Signed)
Unable to obtain blood for blood cultures x2, phlebotomy consulted. IV team consulted as well to assess PICC that pt states has not drawn back "for the past several days" and which has tape around connection points and a line with a know tied at the end and cut rather than a cap. Pt unstageable pressure wound to L greater trochanter assessed by MD, cleansed and redressed with wet-to-dry dressing.

## 2019-07-26 NOTE — Discharge Instructions (Signed)
Stop the CEFEPIME. Continue vancomycin. You are being started on MEROPENEM as well. You received your first dose in the ED. You can also take benadryl every 4 hours as needed for the itching/rash.
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
## 2019-07-26 NOTE — Telephone Encounter (Signed)
-----   Message from Thayer Headings, MD sent at 07/26/2019  2:53 PM EDT ----- In the ED and going back home.  Hives.  Probably from Cefepime.  Can you have him changed to meropenem 2 grams three times per day for the same duration with home health. He is getting a first dose in the ED now.  Stop cefepime, continue vancomycin.  thanks

## 2019-07-26 NOTE — Progress Notes (Signed)
Subjective  Ivan [name] is a 74 y.o. individual  is presenting with the following: rash  Rash Two days of very pruritic, migratory lesions. Localized to trunk, forearms, and back. Has tried Benadryl and OTC cream for this. No wheezing, difficulty swallowing, GI symptoms, cough, chest pain. Feels overall very weak. Has noted declined in progress with walking.  Antibiotics The patient is currently completing antibiotic therapy with Cefepime and Vancomycin.  He is medications last evening.  He reports he did not take his morning medications as he feels the hives are likely secondary to all of these.  Weakness The patient complains of overall weakness today.  He reports he has had dizziness upon standing for a while now but this is worsened over the last day.  Feels overall tired and fatigued.  Denies chest pain or dyspnea.  Reports stable lower extremity edema.  He has not noted palpitations.  As above he has missed 1 dose of his antibiotic from his report.   Objective Vital Signs reviewed BP 130/72   Pulse (!) 125   Ht 5\' 10"  (1.778 m)   Wt 205 lb 9.6 oz (93.3 kg)   SpO2 98%   BMI 29.50 kg/m  HEENT: Sclera anicteric.Tired appearing, slightly pale, ill appearing . : Cardiac: tachycardic, no murmurs. Regular. No murmurs, rubs, or gallops appreciated. Lungs: Clear bilaterally to ascultation.  Abdomen: Soft, non distended  Extremities: 1-2+ pedal edema   Skin Dry--on trunk, posterior back, flexor surface of UE and LE raised, erythematous, blanching wheel like lesions consistent with hives   Assessments/Plans Tachycardia With concern for progression of infection or systemic antibiotic reaction given his urticarial lesions.  Given the patient's significant underlying disease and persistent tachycardia throughout our visit recommend evaluation in the emergency department differential includes progression of his infection with sepsis, pulmonary embolism.  No atrial fibrillation on EKG less  likely cardiac etiology  Drug eruption, urticarial The patient has urticarial lesions on examination today, likely due to cefepime.  Discussed with Dr. Tommy Medal, recommended holding Cefepime. Recommended discussing further with Dr. Linus Salmons if admitted/discharged from ED. Would need gram negative and positive coverage.   Requests refills on oxycodone and HC. Will discuss further with patient after ED visit. PMP reviewed.   [name], MD  Family Medicine Teaching Service

## 2019-07-26 NOTE — Patient Instructions (Addendum)
I recommend further evaluation in the Emergency Department.  I sent in your cream for your skin--- a stronger version of hydrocortisone.     [name], MD  Family Medicine

## 2019-07-27 ENCOUNTER — Other Ambulatory Visit: Payer: Self-pay

## 2019-07-27 ENCOUNTER — Telehealth: Payer: Self-pay

## 2019-07-27 MED ORDER — OXYCODONE HCL 5 MG PO TABS: 5.0000 mg | ORAL_TABLET | Freq: Four times a day (QID) | ORAL | 0 refills | Status: DC | PRN

## 2019-07-27 NOTE — Telephone Encounter (Signed)
Called pharmacy and confirmed the patient had gotten 1 g of meropenem in emergency department yesterday.  They I reached out to infectious disease for prescription at this time.

## 2019-07-27 NOTE — Telephone Encounter (Signed)
Called patient. Left voicemail to call back. Will discuss reducing frequency of oxycodone.   [name], MD  Family Medicine Teaching Service

## 2019-07-27 NOTE — Telephone Encounter (Deleted)
Confirmed with [facility]

## 2019-07-27 NOTE — Telephone Encounter (Signed)
Anderson Malta, Pharmacist from [facility], calls nurse line confused about patients IV meds. Looks like he was on Cefepime and then changed to Meropenem by infectious disease. Pharmacy needs to know about all IV medication changes. I advised to speak with PCP. Please call Anderson Malta at [phone] hit option 4.  She will not be in the office tomorrow, call her if you get a chance today before 5.

## 2019-07-28 ENCOUNTER — Telehealth: Payer: Self-pay | Admitting: Family Medicine

## 2019-07-28 NOTE — Telephone Encounter (Signed)
Left generic voicemail to call back.  Short term disability for work completed, in to fax pile.  If calls back, need to discuss Rx for oxycodone.   [name], MD  Family Medicine Teaching Service

## 2019-07-29 ENCOUNTER — Other Ambulatory Visit: Payer: Self-pay | Admitting: Internal Medicine

## 2019-07-29 ENCOUNTER — Other Ambulatory Visit: Payer: Self-pay

## 2019-07-29 ENCOUNTER — Encounter (HOSPITAL_BASED_OUTPATIENT_CLINIC_OR_DEPARTMENT_OTHER): Payer: Medicare HMO | Attending: Internal Medicine | Admitting: Internal Medicine

## 2019-07-29 ENCOUNTER — Telehealth: Payer: Self-pay | Admitting: *Deleted

## 2019-07-29 DIAGNOSIS — T8131XD Disruption of external operation (surgical) wound, not elsewhere classified, subsequent encounter: Secondary | ICD-10-CM | POA: Diagnosis not present

## 2019-07-29 DIAGNOSIS — X58XXXD Exposure to other specified factors, subsequent encounter: Secondary | ICD-10-CM | POA: Diagnosis not present

## 2019-07-29 DIAGNOSIS — L89214 Pressure ulcer of right hip, stage 4: Secondary | ICD-10-CM | POA: Diagnosis not present

## 2019-07-29 DIAGNOSIS — Z8616 Personal history of COVID-19: Secondary | ICD-10-CM | POA: Insufficient documentation

## 2019-07-29 DIAGNOSIS — L89224 Pressure ulcer of left hip, stage 4: Secondary | ICD-10-CM | POA: Insufficient documentation

## 2019-07-29 MED ORDER — CIPROFLOXACIN HCL 750 MG PO TABS: 750.0000 mg | ORAL_TABLET | Freq: Two times a day (BID) | ORAL | 1 refills | Status: DC

## 2019-07-29 NOTE — Progress Notes (Addendum)
Ivan [name], Ivan [name] ([id number]) Visit Report for 07/29/2019 Chief Complaint Document Details Patient Name: Date of Service: Ivan [name], Ivan [name] 07/29/2019 1:15 PM Medical Record [id number] Patient Account Number: [id number] Date of Birth/Sex: Treating RN: November 27, 1945 (74 y.o. [name] Primary Care Provider: [name] Other Clinician: Referring Provider: Treating Provider/Extender:Raheel Kunkle, [name], [name] in Treatment: 0 Information Obtained from: Patient Chief Complaint 07/29/2019; patient is here for review of a fairly substantial wound over the left greater trochanter Electronic Signature(s) Signed: 07/29/2019 7:02:26 PM By: [name] MD Entered By: [name] on 07/29/2019 14:52:01 -------------------------------------------------------------------------------- Debridement Details Patient Name: Date of Service: Ivan [name] 07/29/2019 1:15 PM Medical Record [id number] Patient Account Number: [id number] Date of Birth/Sex: 10-Feb-1946 (73 y.o. M) Treating RN: [name] Primary Care Provider: [name] Other Clinician: Referring Provider: Treating Provider/Extender:Kevyn Boquet, [name], Carina Weeks in Treatment: 0 Debridement Performed for Wound #1 Left Trochanter Assessment: Performed By: Physician [name]., MD Debridement Type: Debridement Level of Consciousness (Pre- Awake and Alert procedure): Pre-procedure Verification/Time Out Taken: Yes - 14:12 Start Time: 14:13 Pain Control: Lidocaine 4% Topical Solution Total Area Debrided (L x W): 6.1 (cm) x 4.5 (cm) = 27.45 (cm) Tissue and other material Viable, Non-Viable, Fat, Muscle, Slough, Subcutaneous, Fibrin/Exudate, Slough debrided: Level: Skin/Subcutaneous Tissue/Muscle Debridement Description: Excisional Instrument: Blade, Forceps Bleeding: Minimum Hemostasis Achieved: Pressure End Time: 14:19 Procedural Pain: 0 Post Procedural Pain: 0 Response to  Treatment: Procedure was tolerated well Level of Consciousness Awake and Alert (Post-procedure): Post Debridement Measurements of Total Wound Length: (cm) 6.1 Stage: Category/Stage IV Width: (cm) 4.5 Depth: (cm) 1.4 Volume: (cm) 30.183 Character of Wound/Ulcer Post Requires Further Debridement Debridement: Post Procedure Diagnosis Same as Pre-procedure Electronic Signature(s) Signed: 07/29/2019 4:55:35 PM By: [name] Signed: 07/29/2019 7:02:26 PM By: [name] MD Entered By: [name] on 07/29/2019 14:53:45 -------------------------------------------------------------------------------- HPI Details Patient Name: Date of Service: Ivan [name] 07/29/2019 1:15 PM Medical Record [id number] Patient Account Number: [id number] Date of Birth/Sex: Treating RN: Jul 25, 1945 (74 y.o. 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 He required admission to hospital from 1/5 through 1/9 with Covid pneumonia. At that point he apparently left AMA. He was noted to have a left hip bruise at the time of discharge. I suspect this really was a deep tissue injury. He was readmitted from 1/13 through 1//27 with altered mental status Covid pneumonia and delirium. At that point he had a left hip and thigh wound which had worsened. Felt to be a pressure injury. He was seen by general surgery who recommended normal saline wet-to-dry twice daily he did not have a debridement. After he left at that point he was incapacitated by increasing lumbar pain. An MRI on 2/9 showed discitis and osteomyelitis at L1/2. He asked aspiration of 1 cm fluid however I think this culture negative. He was also noted at this point to  have a very large wound over the left greater trochanter. He was taken to the OR by Dr. Marla Roe for debridement with ACell placement. He was placed in a wound VAC. A culture at that point showed Enterococcus and Enterobacter. An MRI on 3/15 showed no evidence of left hip septic arthritis or osteomyelitis. Extensive subcutaneous edema within the visualized soft tissue of the left thigh and prominent left inguinal lymph  nodes likely reactive. He was started on IV cefepime and vancomycin for 6 weeks. He was discharged to [facility] facility as the patient. There I gather the wound developed the necrotic surface. He was seen by the wound care doctor in house who correctly pointed out that a wound VAC would not do anything with a necrotic surface. Since then he has been using Santyl The patient is now at home. He was on vancomycin and cefepime. He developed hives and a generalized rash and was seen in the ER on 3/15 by Dr. Cain Sieve recommendation he was changed to meropenem however he also apparently developed hives after this. He says the original hives stopped after the cefepime was stopped and follow- up hives only developed after the meropenem. He is only taken 1 dose of the fear of the meropenem and now is on vancomycin. Electronic Signature(s) Signed: 07/30/2019 5:09:02 PM By: [name] MD Previous Signature: 07/29/2019 7:02:26 PM Version By: [name] MD Entered By: [name] on 07/30/2019 [id number] -------------------------------------------------------------------------------- Physical Exam Details Patient Name: Date of Service: Ivan [name] 07/29/2019 1:15 PM Medical Record [id number] Patient Account Number: [id number] Date of Birth/Sex: Treating RN: 1945-11-22 (74 y.o. [name] Primary Care Provider: [name] Other Clinician: Referring Provider: Treating Provider/Extender:Viktoriya Glaspy, [name], Carina Weeks in Treatment:  0 Constitutional Sitting or standing Blood Pressure is within target range for patient.. Pulse regular and within target range for patient.Marland Kitchen Respirations regular, non-labored and within target range.. Temperature is normal and within the target range for the patient.Marland Kitchen Appears in no distress. Cardiovascular Heart rhythm and rate regular, without murmur or gallop.. . Gastrointestinal (GI) Abdomen is soft and non-distended without masses or tenderness.. No liver or spleen enlargement. Notes Wound exam; the areas over the left greater trochanter. The fairly substantial wound almost 100% necrotic material. First of all using a #15 scalpel and pickups I removed probably necrotic fat. I then used a #5 curette to clean up the surface removing what looks to be necrotic muscle. I was able to unroofed a tunnel going from 11-1 o'clock perhaps 3 cm. Fortunately in all of this there was no exposed bone no surrounding erythema and no crepitus Electronic Signature(s) Signed: 07/29/2019 7:02:26 PM By: [name] MD Entered By: [name] on 07/29/2019 14:57:34 -------------------------------------------------------------------------------- Physician Orders Details Patient Name: Date of Service: Ivan [name] 07/29/2019 1:15 PM Medical Record [id number] Patient Account Number: [id number] Date of Birth/Sex: Treating RN: [birth year] (74 y.o. [name] Primary Care Provider: [name] Other Clinician: Referring Provider: Treating Provider/Extender:Keaun Schnabel, [name], Carina Weeks in Treatment: 0 Verbal / Phone Orders: No Diagnosis Coding Follow-up Appointments Return Appointment in 1 week. - Thursday Dressing Change Frequency Change dressing every day. Skin Barriers/Peri-Wound Care Skin Prep Wound Cleansing Clean wound with Wound Cleanser Primary Wound Dressing Wound #1 Left Trochanter Santyl Ointment - backed with saline moisten gauze. Secondary Dressing Foam  Border - or bordered gauze. Off-Loading Other: - stay off of left hip to aid in offloading pressure. Patient Medications Allergies: Iodinated Contrast Media, cefepime, meropenem Notifications Medication Indication Start End lidocaine DOSE topical 4 % gel - gel topical applied before debridement by MD in clinic only. Santyl stage 4 left hip 07/29/2019 DOSE topical 250 unit/gram ointment - ointment topical to wound change daily Electronic Signature(s) Signed: 07/29/2019 4:55:35 PM By: [name] Signed: 07/29/2019 7:02:26 PM By: [name] MD Previous Signature: 07/29/2019 2:40:01 PM Version By: [name] MD Entered By: [name] on 07/29/2019 14:53:30 -------------------------------------------------------------------------------- Prescription 07/29/2019  Patient Name: Ivan [name] Provider: [name] MD Date of Birth: 1946/02/27 NPI#: [id number] Sex: Valeta Harms: [id number] Phone #: [id number] License #: [id number] Patient Address: [address], [place] [address] [place], [place] [address] [phone] Allergies Iodinated Contrast Media cefepime meropenem Medication Medication: Route: Strength: Form: lidocaine 4 % topical gel topical 4% gel Class: TOPICAL LOCAL ANESTHETICS Dose: Frequency / Time: Indication: gel topical applied before debridement by MD in clinic only. Number of Refills: Number of Units: 0 Generic Substitution: Start Date: End Date: One Time Use: Substitution Permitted No Note to Pharmacy: Signature(s): Date(s): Electronic Signature(s) Signed: 07/29/2019 4:55:35 PM By: [name] Signed: 07/29/2019 7:02:26 PM By: [name] MD Entered By: [name] on 07/29/2019 14:53:30 --------------------------------------------------------------------------------  Problem List Details Patient Name: Date of Service: Ivan [name]. 07/29/2019  1:15 PM Medical Record [id number] Patient Account Number: [id number] Date of Birth/Sex: Treating RN: 05-20-1945 (74 y.o. [name] Primary Care Provider: [name] Other Clinician: Referring Provider: Treating Provider/Extender:Majesta Leichter, [name], Carina Weeks in Treatment: 0 Active Problems ICD-10 Evaluated Encounter Code Description Active Date Today Diagnosis L89.224 Pressure ulcer of left hip, stage 4 08/05/2019 No Yes T81.31XD Disruption of external operation (surgical) wound, not 07/29/2019 No Yes elsewhere classified, subsequent encounter Inactive Problems Resolved Problems Electronic Signature(s) Signed: 08/05/2019 6:03:33 PM By: [name] MD Previous Signature: 07/29/2019 7:02:26 PM Version By: [name] MD Entered By: [name] on 08/05/2019 16:47:27 -------------------------------------------------------------------------------- Progress Note Details Patient Name: Date of Service: Ivan [name] 07/29/2019 1:15 PM Medical Record [id number] Patient Account Number: [id number] Date of Birth/Sex: Treating RN: March 08, 1946 (74 y.o. 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 He required admission to hospital from 1/5 through 1/9 with Covid pneumonia. At that point he apparently left AMA. He was noted to have a left hip bruise at the time of discharge. I suspect this really was a deep tissue injury. He was readmitted from 1/13 through 1//27 with altered mental status Covid pneumonia and delirium. At that  point he had a left hip and thigh wound which had worsened. Felt to be a pressure injury. He was seen by general surgery who recommended normal saline wet-to-dry twice daily he did not have a debridement. After he left at that point he was incapacitated by increasing lumbar pain. An MRI on 2/9 showed discitis and osteomyelitis at L1/2. He asked aspiration of 1 cm fluid however I think this culture negative. He was also noted at this point to have a very large wound over the left greater trochanter. He was taken to the OR by Dr. Marla Roe for debridement with ACell placement. He was placed in a wound VAC. A culture at that point showed Enterococcus and Enterobacter. An MRI on 3/15 showed no evidence of left hip septic arthritis or osteomyelitis. Extensive subcutaneous edema within the visualized soft tissue of the left thigh and prominent left inguinal lymph nodes likely reactive. He was started on IV cefepime and vancomycin for 6 weeks. He was discharged to [facility] facility as the patient. There I gather the wound developed the necrotic surface.  He was seen by the wound care doctor in house who correctly pointed out that a wound VAC would not do anything with a necrotic surface. Since then he has been using Santyl The patient is now at home. He was on vancomycin and cefepime. He developed hives and a generalized rash and was seen in the ER on 3/15 by Dr. Cain Sieve recommendation he was changed to meropenem however he also apparently developed hives after this. He says the original hives stopped after the cefepime was stopped and follow- up hives only developed after the meropenem. He is only taken 1 dose of the fear of the meropenem and now is on vancomycin. Patient History Information obtained from Patient. Allergies Iodinated Contrast Media, cefepime, meropenem Family History Cancer - Paternal Grandparents, Diabetes - Paternal Grandparents, Kidney Disease - Maternal Grandparents, No  family history of Heart Disease, Hereditary Spherocytosis, Hypertension, Lung Disease, Seizures, Stroke, Thyroid Problems, Tuberculosis. Social History Former smoker - quit 5 years ago, Marital Status - Married, Alcohol Use - Rarely, Drug Use - No History, Caffeine Use - Daily. Medical History Eyes Patient has history of Cataracts Denies history of Glaucoma, Optic Neuritis Ear/Nose/Mouth/Throat Denies history of Chronic sinus problems/congestion, Middle ear problems Hematologic/Lymphatic Denies history of Anemia, Hemophilia, Human Immunodeficiency Virus, Lymphedema, Sickle Cell Disease Respiratory Denies history of Aspiration, Asthma, Chronic Obstructive Pulmonary Disease (COPD), Pneumothorax, Sleep Apnea, Tuberculosis Cardiovascular Patient has history of Coronary Artery Disease, Hypertension, Myocardial Infarction Gastrointestinal Denies history of Cirrhosis , Colitis, Crohnoos, Hepatitis A, Hepatitis B, Hepatitis C Endocrine Denies history of Type I Diabetes, Type II Diabetes Genitourinary Denies history of End Stage Renal Disease Immunological Denies history of Lupus Erythematosus, Raynaudoos, Scleroderma Integumentary (Skin) Denies history of History of Burn Musculoskeletal Patient has history of Osteoarthritis, Osteomyelitis Denies history of Gout, Rheumatoid Arthritis Neurologic Denies history of Dementia, Neuropathy, Quadriplegia, Paraplegia, Seizure Disorder Oncologic Denies history of Received Chemotherapy, Received Radiation Psychiatric Denies history of Anorexia/bulimia, Confinement Anxiety Hospitalization/Surgery History - appendectomy. - cardiac catheterization. - cataract extraction. - cervical fusion. - CABG. Medical And Surgical History Notes Oncologic skin cancer Review of Systems (ROS) Constitutional Symptoms (General Health) Denies complaints or symptoms of Fatigue, Fever, Chills, Marked Weight Change. Eyes Denies complaints or symptoms of Dry Eyes,  Vision Changes, Glasses / Contacts. Ear/Nose/Mouth/Throat Denies complaints or symptoms of Chronic sinus problems or rhinitis. Respiratory Denies complaints or symptoms of Chronic or frequent coughs, Shortness of Breath. Cardiovascular Denies complaints or symptoms of Chest pain. Gastrointestinal Denies complaints or symptoms of Frequent diarrhea, Nausea, Vomiting. Endocrine Denies complaints or symptoms of Heat/cold intolerance. Genitourinary Denies complaints or symptoms of Frequent urination. Integumentary (Skin) Complains or has symptoms of Wounds, left trochanter Musculoskeletal Denies complaints or symptoms of Muscle Pain, Muscle Weakness. Neurologic Denies complaints or symptoms of Numbness/parasthesias. Psychiatric Denies complaints or symptoms of Claustrophobia, Suicidal. Objective Constitutional Sitting or standing Blood Pressure is within target range for patient.. Pulse regular and within target range for patient.Marland Kitchen Respirations regular, non-labored and within target range.. Temperature is normal and within the target range for the patient.Marland Kitchen Appears in no distress. Vitals Time Taken: 1:30 PM, Height: 70 in, Source: Stated, Weight: 198 lbs, Source: Stated, BMI: 28.4, Temperature: 98.7 F, Pulse: 86 bpm, Respiratory Rate: 20 breaths/min, Blood Pressure: 131/49 mmHg. Cardiovascular Heart rhythm and rate regular, without murmur or gallop.. Gastrointestinal (GI) Abdomen is soft and non-distended without masses or tenderness.. No liver or spleen enlargement. General Notes: Wound exam; the areas over the left greater trochanter. The fairly substantial wound almost 100% necrotic  material. First of all using a #15 scalpel and pickups I removed probably necrotic fat. I then used a #5 curette to clean up the surface removing what looks to be necrotic muscle. I was able to unroofed a tunnel going from 11-1 o'clock perhaps 3 cm. Fortunately in all of this there was no exposed bone  no surrounding erythema and no crepitus Integumentary (Hair, Skin) Wound #1 status is Open. Original cause of wound was Surgical Injury. The wound is located on the Left Trochanter. The wound measures 6.1cm length x 4.5cm width x 1.4cm depth; 21.559cm^2 area and 30.183cm^3 volume. There is muscle and Fat Layer (Subcutaneous Tissue) Exposed exposed. There is no tunneling or undermining noted. There is a large amount of purulent drainage noted. The wound margin is well defined and not attached to the wound base. There is small (1-33%) pink granulation within the wound bed. There is a large (67- 100%) amount of necrotic tissue within the wound bed including Adherent Slough. Assessment Active Problems ICD-10 Pressure ulcer of left hip, stage 4 Disruption of external operation (surgical) wound, not elsewhere classified, subsequent encounter Procedures Wound #1 Pre-procedure diagnosis of Wound #1 is a Pressure Ulcer located on the Left Trochanter . There was a Excisional Skin/Subcutaneous Tissue/Muscle Debridement with a total area of 27.45 sq cm performed by [name]., MD. With the following instrument(s): Blade, and Forceps to remove Viable and Non-Viable tissue/material. Material removed includes Fat, Muscle, Subcutaneous Tissue, Slough, and Fibrin/Exudate after achieving pain control using Lidocaine 4% Topical Solution. A time out was conducted at 14:12, prior to the start of the procedure. A Minimum amount of bleeding was controlled with Pressure. The procedure was tolerated well with a pain level of 0 throughout and a pain level of 0 following the procedure. Post Debridement Measurements: 6.1cm length x 4.5cm width x 1.4cm depth; 30.183cm^3 volume. Post debridement Stage noted as Category/Stage IV. Character of Wound/Ulcer Post Debridement requires further debridement. Post procedure Diagnosis Wound #1: Same as Pre-Procedure Plan Follow-up Appointments: Return Appointment in 1  week. - Thursday Dressing Change Frequency: Change dressing every day. Skin Barriers/Peri-Wound Care: Skin Prep Wound Cleansing: Clean wound with Wound Cleanser Primary Wound Dressing: Wound #1 Left Trochanter: Santyl Ointment - backed with saline moisten gauze. Secondary Dressing: Foam Border - or bordered gauze. Off-Loading: Other: - stay off of left hip to aid in offloading pressure. The following medication(s) was prescribed: lidocaine topical 4 % gel gel topical applied before debridement by MD in clinic only. was prescribed at facility Santyl topical 250 unit/gram ointment ointment topical to wound change daily for stage 4 left hip starting 07/29/2019 1. Extensive debridement of this wound area. I was able to get to some decent looking tissue but there is more ongoing debridement is required. I think Santyl back by a wet-to-dry dressing is really the best choice here. I have represcribed the Santyl as he was out of it. He will be covered by border foam or an ABD and tape 2. I saw no evidence of infection in this area. He is still having some back pain. No doubt the discitis was related to infection probably obtained in the left hip wound. I did not see this specifically stated however. 3. I have put in a secure text message to infectious disease Dr. Linus Salmons about the patient's presumed hives related to his single dose of meropenem yesterday 4. Once we can get a reasonably healthy surface on this wound I think it would be quite reasonable to reapply  a wound VAC and this would give him the best chance to heal this wound the quickest. I have counseled him to be religious in offloading this area. He expressed understanding Electronic Signature(s) Signed: 08/05/2019 4:48:00 PM By: [name] MD Previous Signature: 07/30/2019 7:27:35 AM Version By: [name] MD Previous Signature: 07/29/2019 7:02:26 PM Version By: [name] MD Entered By: [name] on 08/05/2019  16:48:00 -------------------------------------------------------------------------------- HxROS Details Patient Name: Date of Service: Ivan [name] 07/29/2019 1:15 PM Medical Record [id number] Patient Account Number: [id number] Date of Birth/Sex: Treating RN: 03-31-1946 (74 y.o. [name] Primary Care Provider: [name] Other Clinician: Referring Provider: Treating Provider/Extender:Tylin Force, [name], Carina Weeks in Treatment: 0 Information Obtained From Patient Constitutional Symptoms (General Health) Complaints and Symptoms: Negative for: Fatigue; Fever; Chills; Marked Weight Change Eyes Complaints and Symptoms: Negative for: Dry Eyes; Vision Changes; Glasses / Contacts Medical History: Positive for: Cataracts Negative for: Glaucoma; Optic Neuritis Ear/Nose/Mouth/Throat Complaints and Symptoms: Negative for: Chronic sinus problems or rhinitis Medical History: Negative for: Chronic sinus problems/congestion; Middle ear problems Respiratory Complaints and Symptoms: Negative for: Chronic or frequent coughs; Shortness of Breath Medical History: Negative for: Aspiration; Asthma; Chronic Obstructive Pulmonary Disease (COPD); Pneumothorax; Sleep Apnea; Tuberculosis Cardiovascular Complaints and Symptoms: Negative for: Chest pain Medical History: Positive for: Coronary Artery Disease; Hypertension; Myocardial Infarction Gastrointestinal Complaints and Symptoms: Negative for: Frequent diarrhea; Nausea; Vomiting Medical History: Negative for: Cirrhosis ; Colitis; Crohns; Hepatitis A; Hepatitis B; Hepatitis C Endocrine Complaints and Symptoms: Negative for: Heat/cold intolerance Medical History: Negative for: Type I Diabetes; Type II Diabetes Genitourinary Complaints and Symptoms: Negative for: Frequent urination Medical History: Negative for: End Stage Renal Disease Integumentary (Skin) Complaints and Symptoms: Positive for:  Wounds Review of System Notes: left trochanter Medical History: Negative for: History of Burn Musculoskeletal Complaints and Symptoms: Negative for: Muscle Pain; Muscle Weakness Medical History: Positive for: Osteoarthritis; Osteomyelitis Negative for: Gout; Rheumatoid Arthritis Neurologic Complaints and Symptoms: Negative for: Numbness/parasthesias Medical History: Negative for: Dementia; Neuropathy; Quadriplegia; Paraplegia; Seizure Disorder Psychiatric Complaints and Symptoms: Negative for: Claustrophobia; Suicidal Medical History: Negative for: Anorexia/bulimia; Confinement Anxiety Hematologic/Lymphatic Medical History: Negative for: Anemia; Hemophilia; Human Immunodeficiency Virus; Lymphedema; Sickle Cell Disease Immunological Medical History: Negative for: Lupus Erythematosus; Raynauds; Scleroderma Oncologic Medical History: Negative for: Received Chemotherapy; Received Radiation Past Medical History Notes: skin cancer HBO Extended History Items Eyes: Cataracts Immunizations Pneumococcal Vaccine: Received Pneumococcal Vaccination: Yes Implantable Devices None Hospitalization / Surgery History Type of Hospitalization/Surgery appendectomy cardiac catheterization cataract extraction cervical fusion CABG Family and Social History Cancer: Yes - Paternal Grandparents; Diabetes: Yes - Paternal Grandparents; Heart Disease: No; Hereditary Spherocytosis: No; Hypertension: No; Kidney Disease: Yes - Maternal Grandparents; Lung Disease: No; Seizures: No; Stroke: No; Thyroid Problems: No; Tuberculosis: No; Former smoker - quit 5 years ago; Marital Status - Married; Alcohol Use: Rarely; Drug Use: No History; Caffeine Use: Daily; Financial Concerns: No; Food, Clothing or Shelter Needs: No; Support System Lacking: No; Transportation Concerns: No Electronic Signature(s) Signed: 07/29/2019 4:36:06 PM By: Kela Millin Signed: 07/29/2019 7:02:26 PM By: [name]  MD Entered By: Kela Millin on 07/29/2019 13:48:51 -------------------------------------------------------------------------------- SuperBill Details Patient Name: Date of Service: Ivan [name] 07/29/2019 Medical Record [id number] Patient Account Number: [id number] Date of Birth/Sex: Treating RN: Jun 12, 1945 (74 y.o. [name] Primary Care Provider: [name] Other Clinician: Referring Provider: Treating Provider/Extender:Janani Chamber, [name], Carina Weeks in Treatment: 0 Diagnosis Coding ICD-10 Codes Code Description [phone] Pressure ulcer of left hip, stage 4 Disruption of external operation (surgical) wound,  not elsewhere classified, subsequent T81.31XD encounter Facility Procedures The patient participates with Medicare or their insurance follows the Medicare Facility Guidelines: CPT4 Code Description Modifier Quantity [id number] [place] VISIT-LEV 3 EST PT 1 The patient participates with Medicare or their insurance follows the Medicare Facility Guidelines: [id number] [place] MUSC/FASCIA 20 SQ CM/< 1 ICD-10 Diagnosis Description L89.224 Pressure ulcer of left hip, stage 4 The patient participates with Medicare or their insurance follows the Medicare Facility Guidelines: [id number] [place] 20 CM 1 ICD-10 Diagnosis Description L89.224 Pressure ulcer of left hip, stage 4 Physician Procedures CPT4: Code [id number] WC Description: PHYS LEVEL 3 NEW PT ICD-10 Diagnosis Description T81.31XD Disruption of external operation (surgical) wound, not elsewhe subsequent encounter Modifier: re classifie Quantity: 1 d, CPT4: [id number] Description: [id number] - WC PHYS DEBR MUSCLE/FASCIA 20 SQ CM ICD-10 Diagnosis Description L89.224 Pressure ulcer of left hip, stage 4 Modifier: Quantity: 1 Electronic Signature(s) Signed: 08/05/2019 6:03:33 PM By: [name] MD Previous Signature: 07/29/2019 7:02:26 PM Version By: [name] MD Entered By:  [name] on 08/05/2019 16:48:32

## 2019-07-29 NOTE — Telephone Encounter (Signed)
-----   Message from Thayer Headings, MD sent at 07/29/2019  4:09 PM EDT ----- This guy developed hives now after starting meropenem.  At this point, see if he can change to daptomycin 6 mg/kg once daily, stop the vancomycin and meropenem and start oral cipro 750 mg twice a day.  I have sent in the cipro prescription.   It is [facility] pharmacy/home health I think.   Thanks!

## 2019-07-29 NOTE — Telephone Encounter (Signed)
Spoke with Leonie Green at [facility] infusion pharmacy. Order repeated and verified. He will need first dose at short stay due to insurance.  Will call short stay Friday morning to get this first dose scheduled. Please advise on stop date. Infusion pharmacy is closed Saturday and Sunday. They have been unsuccessful in reaching him. RN called patient to let him know, had to leave a message.

## 2019-07-29 NOTE — Progress Notes (Signed)
Ivan [name], Ivan [name] ([id number]) Visit Report for 07/29/2019 Allergy List Details Patient Name: Date of Service: Ivan [name], Ivan [name] 07/29/2019 1:15 PM Medical Record [id number] Patient Account Number: [id number] Date of Birth/Sex: Treating RN: 1946-02-16 (74 y.o. [name] Primary Care Floyed Masoud: [name] Other Clinician: Referring Meshach Perry: Treating Ravonda Brecheen/Extender:[name], [name], Carina Weeks in Treatment: 0 Allergies Active Allergies Iodinated Contrast Media cefepime meropenem Allergy Notes Electronic Signature(s) Signed: 07/29/2019 4:36:06 PM By: Kela Millin Entered By: Kela Millin on 07/29/2019 14:23:21 -------------------------------------------------------------------------------- Arrival Information Details Patient Name: Date of Service: Ivan Ewing 07/29/2019 1:15 PM Medical Record [id number] Patient Account Number: [id number] Date of Birth/Sex: Treating RN: Jul 06, 1945 (74 y.o. [name] Primary Care Latese Dufault: [name] Other Clinician: Referring Ebrahim Deremer: Treating Jerret Mcbane/Extender:[name], [name], [name] in Treatment: 0 Visit Information Patient Arrived: [place] Arrival Time: 13:36 Accompanied By: self Transfer Assistance: None Patient Identification Verified: Yes Secondary Verification Process Completed: Yes Patient Requires Transmission-Based No Precautions: Patient Has Alerts: No Electronic Signature(s) Signed: 07/29/2019 4:36:06 PM By: Kela Millin Entered By: Kela Millin on 07/29/2019 13:37:54 -------------------------------------------------------------------------------- Clinic Level of Care Assessment Details Patient Name: Date of Service: Ivan [name], Ivan [name] 07/29/2019 1:15 PM Medical Record [id number] Patient Account Number: [id number] Date of Birth/Sex: Treating RN: 19-Jun-1945 (74 y.o. [name] Primary Care Beatrice Ziehm: [name] Other  Clinician: Referring Cassi Jenne: Treating Fama Muenchow/Extender:[name], [name], Carina Weeks in Treatment: 0 Clinic Level of Care Assessment Items TOOL 1 Quantity Score X - Use when EandM and Procedure is performed on INITIAL visit 1 0 ASSESSMENTS - Nursing Assessment / Reassessment X - General Physical Exam (combine w/ comprehensive assessment (listed just below) 1 20 when performed on new pt. evals) X - Comprehensive Assessment (HX, ROS, Risk Assessments, Wounds Hx, etc.) 1 25 ASSESSMENTS - Wound and Skin Assessment / Reassessment X - Dermatologic / Skin Assessment (not related to wound area) 1 10 ASSESSMENTS - Ostomy and/or Continence Assessment and Care _0  - Incontinence Assessment and Management 0 _1  - Ostomy Care Assessment and Management (repouching, etc.) 0 PROCESS - Coordination of Care _2  - Simple Patient / Family Education for ongoing care 0 X - Complex (extensive) Patient / Family Education for ongoing care 1 20 X - Staff obtains Programmer, systems, Records, Test Results / Process Orders 1 10 _3  - Staff telephones HHA, Nursing Homes / Clarify orders / etc 0 _4  - Routine Transfer to another Facility (non-emergent condition) 0 _5  - Routine Hospital Admission (non-emergent condition) 0 X - New Admissions / Biomedical engineer / Ordering NPWT, Apligraf, etc. 1 15 _6  - Emergency Hospital Admission (emergent condition) 0 PROCESS - Special Needs _7  - Pediatric / Minor Patient Management 0 _8  - Isolation Patient Management 0 _9  - Hearing / Language / Visual special needs 0 _10  - Assessment of Community assistance (transportation, D/C planning, etc.) 0 _11  - Additional assistance / Altered mentation 0 _12  - Support Surface(s) Assessment (bed, cushion, seat, etc.) 0 INTERVENTIONS - Miscellaneous _13  - External ear exam 0 _14  - Patient Transfer (multiple staff / Civil Service fast streamer / Similar devices) 0 _15  - Simple Staple / Suture removal (25 or less) 0 _16  - Complex Staple / Suture removal (26 or  more) 0 _17  - Hypo/Hyperglycemic Management (do not check if billed separately) 0 _18  - Ankle / Brachial Index (ABI) - do not check if billed separately 0 Has the patient been seen at the hospital within the last three years: Yes Total Score: 100 Level Of Care: New/Established - Level 3 Electronic Signature(s) Signed: 07/29/2019 4:55:35 PM  By: [name] Entered By: [name] on 07/29/2019 14:23:44 -------------------------------------------------------------------------------- Lower Extremity Assessment Details Patient Name: Date of Service: Ivan [name], Ivan [name] 07/29/2019 1:15 PM Medical Record [id number] Patient Account Number: [id number] Date of Birth/Sex: Treating RN: 02/24/1946 (74 y.o. [name] Primary Care Millisa Giarrusso: [name] Other Clinician: Referring Meshia Rau: Treating Shayann Garbutt/Extender:[name], [name], Carina Weeks in Treatment: 0 Electronic Signature(s) Signed: 07/29/2019 4:36:06 PM By: Kela Millin Entered By: Kela Millin on 07/29/2019 13:53:03 -------------------------------------------------------------------------------- Multi Wound Chart Details Patient Name: Date of Service: Ivan Ewing. 07/29/2019 1:15 PM Medical Record [id number] Patient Account Number: [id number] Date of Birth/Sex: Treating RN: [birth year] (74 y.o. [name] Primary Care Kortney Potvin: [name] Other Clinician: Referring Kanae Ignatowski: Treating Rudolph Dobler/Extender:[name], [name], Carina Weeks in Treatment: 0 Vital Signs Height(in): 70 Pulse(bpm): 86 Weight(lbs): 198 Blood Pressure(mmHg): 131/49 Body Mass Index(BMI): 28 Temperature(F): 98.7 Respiratory 20 Rate(breaths/min): Photos: [1:No Photos] [N/A:N/A] Wound Location: [1:Left Trochanter] [N/A:N/A] Wounding Event: [1:Surgical Injury] [N/A:N/A] Primary Etiology: [1:Open Surgical Wound] [N/A:N/A] Secondary Etiology: [1:Pressure Ulcer] [N/A:N/A] Comorbid History: [1:Cataracts,  Coronary Artery N/A Disease, Hypertension, Myocardial Infarction, Osteoarthritis, Osteomyelitis] Date Acquired: [1:07/01/2019] [N/A:N/A] Weeks of Treatment: [1:0] [N/A:N/A] Wound Status: [1:Open] [N/A:N/A] Measurements L x W x D 6.1x4.5x1.4 [N/A:N/A] (cm) Area (cm) : [1:21.559] [N/A:N/A] Volume (cm) : [1:30.183] [N/A:N/A] Classification: [1:Full Thickness With Exposed Support Structures] [N/A:N/A] Exudate Amount: [1:Medium] [N/A:N/A] Exudate Type: [1:Purulent] [N/A:N/A] Exudate Color: [1:yellow, brown, green] [N/A:N/A] Wound Margin: [1:Well defined, not attached N/A] Granulation Amount: [1:Small (1-33%)] [N/A:N/A] Granulation Quality: [1:Pink] [N/A:N/A] Necrotic Amount: [1:Large (67-100%)] [N/A:N/A] Exposed Structures: [1:Fat Layer (Subcutaneous N/A Tissue) Exposed: Yes Fascia: No Tendon: No Muscle: No Joint: No Bone: No] Epithelialization: [1:None] [N/A:N/A] Debridement: [1:Debridement - Excisional N/A] Pre-procedure [1:14:12] [N/A:N/A] Verification/Time Out Taken: Pain Control: [1:Lidocaine 4% Topical Solution] [N/A:N/A] Tissue Debrided: [1:Muscle, Fat, Subcutaneous, N/A Slough] Level: [1:Skin/Subcutaneous Tissue/Muscle] [N/A:N/A] Debridement Area (sq cm):27.45 [N/A:N/A] Instrument: [1:Blade, Forceps] [N/A:N/A] Bleeding: [1:Minimum] [N/A:N/A] Hemostasis Achieved: [1:Pressure] [N/A:N/A] Procedural Pain: [1:0] [N/A:N/A] Post Procedural Pain: [1:0] [N/A:N/A] Debridement Treatment Procedure was tolerated [N/A:N/A] Response: [1:well] Post Debridement [1:6.1x4.5x1.4] [N/A:N/A] Measurements L x W x D (cm) Post Debridement [1:30.183] [N/A:N/A] Volume: (cm) Procedures Performed: Debridement [N/A:N/A] Treatment Notes Electronic Signature(s) Signed: 07/29/2019 4:55:35 PM By: [name] Signed: 07/29/2019 7:02:26 PM By: [name] MD Entered By: [name] on 07/29/2019  14:50:58 -------------------------------------------------------------------------------- Multi-Disciplinary Care Plan Details Patient Name: Date of Service: Ivan Ewing. 07/29/2019 1:15 PM Medical Record [id number] Patient Account Number: [id number] Date of Birth/Sex: Treating RN: July 04, 1945 (74 y.o. [name] Primary Care Azrael Huss: [name] Other Clinician: Referring Terryn Rosenkranz: Treating Khyron Garno/Extender:[name], [name], Carina Weeks in Treatment: 0 Active Inactive Nutrition Nursing Diagnoses: Potential for alteratiion in Nutrition/Potential for imbalanced nutrition Goals: Patient/caregiver agrees to and verbalizes understanding of need to obtain nutritional consultation Date Initiated: 07/29/2019 Target Resolution Date: 08/20/2019 Goal Status: Active Interventions: Provide education on nutrition Treatment Activities: Patient referred to Primary Care Physician for further nutritional evaluation : 07/29/2019 Notes: Orientation to the Wound Care Program Nursing Diagnoses: Knowledge deficit related to the wound healing center program Goals: Patient/caregiver will verbalize understanding of the [place] Program Date Initiated: 07/29/2019 Target Resolution Date: 08/20/2019 Goal Status: Active Interventions: Provide education on orientation to the wound center Notes: Pain, Acute or Chronic Nursing Diagnoses: Pain, acute or chronic: actual or potential Potential alteration in comfort, pain Goals: Patient will verbalize adequate pain control and receive pain control interventions during procedures as needed Date Initiated: 07/29/2019 Target Resolution Date: 08/20/2019 Goal Status: Active Patient/caregiver will verbalize comfort level  met Date Initiated: 07/29/2019 Target Resolution Date: 08/20/2019 Goal Status: Active Interventions: Encourage patient to take pain medications as prescribed Provide education on pain management Reposition  patient for comfort Treatment Activities: Administer pain control measures as ordered : 07/29/2019 Notes: Wound/Skin Impairment Nursing Diagnoses: Knowledge deficit related to ulceration/compromised skin integrity Goals: Patient/caregiver will verbalize understanding of skin care regimen Date Initiated: 07/29/2019 Target Resolution Date: 08/20/2019 Goal Status: Active Interventions: Assess patient/caregiver ability to obtain necessary supplies Assess patient/caregiver ability to perform ulcer/skin care regimen upon admission and as needed Provide education on ulcer and skin care Treatment Activities: Skin care regimen initiated : 07/29/2019 Topical wound management initiated : 07/29/2019 Notes: Electronic Signature(s) Signed: 07/29/2019 4:55:35 PM By: [name] Entered By: [name] on 07/29/2019 14:13:38 -------------------------------------------------------------------------------- Pain Assessment Details Patient Name: Date of Service: Ivan [name], Ivan [name] 07/29/2019 1:15 PM Medical Record [id number] Patient Account Number: [id number] Date of Birth/Sex: Treating RN: 1946/02/19 (74 y.o. [name] Primary Care Jarod Bozzo: [name] Other Clinician: Referring Artavious Trebilcock: Treating Leroi Haque/Extender:[name], [name], Carina Weeks in Treatment: 0 Active Problems Location of Pain Severity and Description of Pain Patient Has Paino No Site Locations Pain Management and Medication Current Pain Management: Electronic Signature(s) Signed: 07/29/2019 4:36:06 PM By: Kela Millin Entered By: Kela Millin on 07/29/2019 14:00:34 -------------------------------------------------------------------------------- Patient/Caregiver Education Details Patient Name: Date of Service: Ivan [name], Ivan W. 3/18/2021andnbsp1:15 PM Medical Record [id number] Patient Account Number: [id number] Date of Birth/Gender: Treating RN: [birth year] (74 y.o. [name] Primary Care Physician: [name] Other Clinician: Referring Physician: Treating Physician/Extender:[name], [name], [name] in Treatment: 0 Education Assessment Education Provided To: Patient Education Topics Provided Nutrition: Handouts: Nutrition Methods: Explain/Verbal, Printed Responses: Reinforcements needed Welcome To [facility]: Handouts: Welcome To [facility] Methods: Explain/Verbal, Printed Responses: Reinforcements needed Wound/Skin Impairment: Handouts: Caring for Your Ulcer, Skin Care Do's and Dont's Methods: Explain/Verbal, Printed Responses: Reinforcements needed Electronic Signature(s) Signed: 07/29/2019 4:55:35 PM By: [name] Entered By: [name] on 07/29/2019 14:14:06 -------------------------------------------------------------------------------- Wound Assessment Details Patient Name: Date of Service: Ivan [name], Ivan [name] 07/29/2019 1:15 PM Medical Record [id number] Patient Account Number: [id number] Date of Birth/Sex: Treating RN: [birth year] (74 y.o. [name] Primary Care Kjuan Seipp: [name] Other Clinician: Referring Liahna Brickner: Treating Tyneshia Stivers/Extender:[name], [name], Carina Weeks in Treatment: 0 Wound Status Wound Number: 1 Primary Pressure Ulcer Etiology: Wound Location: Left Trochanter Secondary Open Surgical Wound Wounding Event: Surgical Injury Etiology: Date Acquired: 07/01/2019 Wound Open Weeks Of Treatment: 0 Status: Clustered Wound: No Comorbid Cataracts, Coronary Artery Disease, History: Hypertension, Myocardial Infarction, Osteoarthritis, Osteomyelitis Wound Measurements Length: (cm) 6.1 Width: (cm) 4.5 Depth: (cm) 1.4 Area: (cm) 21.559 Volume: (cm) 30.183 Wound Description Classification: Category/Stage IV Wound Margin: Well defined, not attached Exudate Amount: Large Exudate Type: Purulent Exudate Color: yellow, brown, green Wound  Bed Granulation Amount: Small (1-33%) Granulation Quality: Pink Necrotic Amount: Large (67-100%) Necrotic Quality: Adherent Slough After Cleansing: No rino Yes Exposed Structure osed: No (Subcutaneous Tissue) Exposed: Yes osed: No osed: Yes sis of Muscle: No sed: No ed: No % Reduction in Area: 0% % Reduction in Volume: 0% Epithelialization: None Tunneling: No Undermining: No Foul Odor Slough/Fib Fascia Exp Fat Layer Tendon Exp Muscle Exp Necro Joint Expo Bone Expos Electronic Signature(s) Signed: 07/29/2019 4:36:06 PM By: Kela Millin Signed: 07/29/2019 4:55:35 PM By: [name] Entered By: [name] on 07/29/2019 14:53:19 -------------------------------------------------------------------------------- Vitals Details Patient Name: Date of Service: Ivan Ewing. 07/29/2019 1:15 PM Medical Record [id number] Patient Account Number: [id number] Date of Birth/Sex:  Treating RN: 11-Jun-1945 (74 y.o. [name] Primary Care Marybella Ethier: [name] Other Clinician: Referring Rolinda Impson: Treating Geneve Kimpel/Extender:[name], [name], Carina Weeks in Treatment: 0 Vital Signs Time Taken: 13:30 Temperature (F): 98.7 Height (in): 70 Pulse (bpm): 86 Source: Stated Respiratory Rate (breaths/min): 20 Weight (lbs): 198 Blood Pressure (mmHg): 131/49 Source: Stated Reference Range: 80 - 120 mg / dl Body Mass Index (BMI): 28.4 Electronic Signature(s) Signed: 07/29/2019 4:36:06 PM By: Kela Millin Entered By: Kela Millin on 07/29/2019 13:39:43

## 2019-07-29 NOTE — Progress Notes (Signed)
Ivan [name] ([id number]) Visit Report for 07/29/2019 Abuse/Suicide Risk Screen Details Patient Name: Date of Service: Ivan [name], Ivan [name] 07/29/2019 1:15 PM Medical Record [id number] Patient Account Number: [id number] Date of Birth/Sex: Treating RN: 1945-12-30 (74 y.o. Ivan [name] Primary Care Ivan [name]: Ivan [name] Other Clinician: Referring Ivan [name]: Treating [name]/Extender:Ivan [name], Ivan [name], Ivan [name] in Treatment: 0 Abuse/Suicide Risk Screen Items Answer ABUSE RISK SCREEN: Has anyone close to you tried to hurt or harm you recentlyo No Do you feel uncomfortable with anyone in your familyo No Has anyone forced you do things that you didnt want to doo No Electronic Signature(s) Signed: 07/29/2019 4:36:06 PM By: Ivan [name] Entered By: Ivan [name] on 07/29/2019 13:48:57 -------------------------------------------------------------------------------- Activities of Daily Living Details Patient Name: Date of Service: Ivan [name], Ivan [name] 07/29/2019 1:15 PM Medical Record [id number] Patient Account Number: [id number] Date of Birth/Sex: Treating RN: 06/17/1945 (74 y.o. Ivan [name] Primary Care Ivan [name]: Ivan [name] Other Clinician: Referring Ivan [name]: Treating Ivan [name]/Extender:Ivan [name], Ivan [name], Ivan [name] in Treatment: 0 Activities of Daily Living Items Answer Activities of Daily Living (Please select one for each item) Drive Automobile Completely Able Take Medications Completely Able Use Telephone Completely Able Care for Appearance Completely Able Use Toilet Completely Able Bath / Shower Completely Able Dress Self Completely Able Feed Self Completely Able Walk Completely Able Get In / Out Bed Completely Able Housework Completely Able Prepare Meals Completely [name] Completely Able Shop for Self Completely Able Electronic Signature(s) Signed: 07/29/2019 4:36:06 PM By: Ivan [name] Entered By:  Ivan [name] on 07/29/2019 13:49:19 -------------------------------------------------------------------------------- Education Screening Details Patient Name: Date of Service: Ivan [name] 07/29/2019 1:15 PM Medical Record [id number] Patient Account Number: [id number] Date of Birth/Sex: Treating RN: 18-Aug-1945 (74 y.o. Ivan [name] Primary Care [name]: Ivan [name] Other Clinician: Referring [name]: Treating Ivan [name]/Extender:Ivan [name], Ivan [name], Ivan [name] in Treatment: 0 Learning Preferences/Education Level/Primary Language Learning Preference: Explanation Highest Education Level: College or Above Preferred Language: English Cognitive Barrier Language Barrier: No Translator Needed: No Memory Deficit: No Emotional Barrier: No Cultural/Religious Beliefs Affecting Medical Care: No Physical Barrier Impaired Vision: No Impaired Hearing: No Decreased Hand dexterity: No Knowledge/Comprehension Knowledge Level: High Comprehension Level: High Ability to understand written High instructions: Ability to understand verbal High instructions: Motivation Anxiety Level: Calm Cooperation: Cooperative Education Importance: Acknowledges Need Interest in Health Problems: Asks Questions Perception: Coherent Willingness to Engage in Self- High Management Activities: Readiness to Engage in Self- High Management Activities: Electronic Signature(s) Signed: 07/29/2019 4:36:06 PM By: Ivan [name] Entered By: Ivan [name] on 07/29/2019 13:49:45 -------------------------------------------------------------------------------- Fall Risk Assessment Details Patient Name: Date of Service: Ivan [name] 07/29/2019 1:15 PM Medical Record [id number] Patient Account Number: [id number] Date of Birth/Sex: Treating RN: 02-May-1946 (74 y.o. Ivan [name] Primary Care Ivan [name]: Ivan [name] Other Clinician: Referring Ivan [name]: Treating  Ivan [name]/Extender:Ivan [name], Ivan [name], Ivan [name] in Treatment: 0 Fall Risk Assessment Items Have you had 2 or more falls in the last 12 monthso 0 Yes Have you had any fall that resulted in injury in the last 12 monthso 0 No FALLS RISK SCREEN History of falling - immediate or within 3 months 25 Yes Secondary diagnosis (Do you have 2 or more medical diagnoseso) 15 Yes Ambulatory aid None/bed rest/wheelchair/nurse 0 No Crutches/cane/walker 15 Yes Furniture 0 No Intravenous therapy Access/Saline/Heparin Lock 0 No Weak (short steps with or without shuffle, stooped but able to lift head 10 Yes while walking, may seek support from furniture) Impaired (short steps with shuffle, may have difficulty arising from chair, 0 No head  down, impaired balance) Mental Status Oriented to own ability 0 Yes Overestimates or forgets limitations 0 No Risk Level: High Risk Score: 65 Electronic Signature(s) Signed: 07/29/2019 4:36:06 PM By: Ivan [name] Entered By: Ivan [name] on 07/29/2019 13:50:13 -------------------------------------------------------------------------------- Foot Assessment Details Patient Name: Date of Service: Ivan [name]. 07/29/2019 1:15 PM Medical Record [id number] Patient Account Number: [id number] Date of Birth/Sex: Treating RN: 1945-06-21 (74 y.o. Ivan [name] Primary Care Ivan [name]: Ivan [name] Other Clinician: Referring Ivan [name]: Treating Ivan [name]/Extender:Ivan [name], Ivan [name], Ivan [name] in Treatment: 0 Foot Assessment Items Site Locations + = Sensation present, - = Sensation absent, C = Callus, U = Ulcer R = Redness, W = Warmth, M = Maceration, PU = Pre-ulcerative lesion F = Fissure, S = Swelling, D = Dryness Assessment Right: Left: Other Deformity: No No Prior Foot Ulcer: No No Prior Amputation: No No Charcot Joint: No No Ambulatory Status: Ambulatory With Help Assistance Device: Walker Gait: Steady Electronic  Signature(s) Signed: 07/29/2019 4:36:06 PM By: Ivan [name] Entered By: Ivan [name] on 07/29/2019 13:50:41 -------------------------------------------------------------------------------- Nutrition Risk Screening Details Patient Name: Date of Service: Ivan [name] 07/29/2019 1:15 PM Medical Record [id number] Patient Account Number: [id number] Date of Birth/Sex: Treating RN: 1946-04-04 (74 y.o. Ivan [name] Primary Care Jersie Beel: Ivan [name] Other Clinician: Referring Laguana Desautel: Treating Herndon Grill/Extender:Ivan [name], Ivan [name], Ivan [name] in Treatment: 0 Height (in): 70 Weight (lbs): 198 Body Mass Index (BMI): 28.4 Nutrition Risk Screening Items Score Screening NUTRITION RISK SCREEN: I have an illness or condition that made me change the kind and/or 0 No amount of food I eat I eat fewer than two meals per day 0 No I eat few fruits and vegetables, or milk products 0 No I have three or more drinks of beer, liquor or wine almost every day 0 No I have tooth or mouth problems that make it hard for me to eat 0 No I don't always have enough money to buy the food I need 0 No I eat alone most of the time 0 No I take three or more different prescribed or over-the-counter drugs a day 1 Yes 0 No Without wanting to, I have lost or gained 10 pounds in the last six months I am not always physically able to shop, cook and/or feed myself 0 No Nutrition Protocols Good Risk Protocol 0 No interventions needed Moderate Risk Protocol High Risk Proctocol Risk Level: Good Risk Score: 1 Electronic Signature(s) Signed: 07/29/2019 4:36:06 PM By: Ivan [name] Entered By: Ivan [name] on 07/29/2019 13:50:30

## 2019-07-29 NOTE — Progress Notes (Signed)
I received a message from Dr. Dellia Nims and he has now developed hives on meropenem.  We are going to change him to oral cipro and change the vancomycin to daptomycin.   Ivan Headings, MD

## 2019-07-30 ENCOUNTER — Other Ambulatory Visit (HOSPITAL_COMMUNITY): Payer: Self-pay | Admitting: *Deleted

## 2019-07-30 ENCOUNTER — Ambulatory Visit (HOSPITAL_COMMUNITY)
Admission: RE | Admit: 2019-07-30 | Discharge: 2019-07-30 | Disposition: A | Discharge status: Home / Self Care | Payer: Medicare HMO | Source: Ambulatory Visit | Attending: Internal Medicine | Admitting: Internal Medicine

## 2019-07-30 DIAGNOSIS — M869 Osteomyelitis, unspecified: Secondary | ICD-10-CM | POA: Diagnosis not present

## 2019-07-30 LAB — COMPREHENSIVE METABOLIC PANEL
ALT: 21 U/L (ref 0–44)
AST: 20 U/L (ref 15–41)
Albumin: 3.3 g/dL — ABNORMAL LOW (ref 3.5–5.0)
Alkaline Phosphatase: 63 U/L (ref 38–126)
Anion gap: 10 (ref 5–15)
BUN: 23 mg/dL (ref 8–23)
CO2: 25 mmol/L (ref 22–32)
Calcium: 8.8 mg/dL — ABNORMAL LOW (ref 8.9–10.3)
Chloride: 105 mmol/L (ref 98–111)
Creatinine, Ser: 0.93 mg/dL (ref 0.61–1.24)
GFR calc Af Amer: >60 mL/min (ref >60)
GFR calc non Af Amer: >60 mL/min (ref >60)
Glucose, Bld: 149 mg/dL — ABNORMAL HIGH (ref 70–99)
Potassium: 4.1 mmol/L (ref 3.5–5.1)
Sodium: 140 mmol/L (ref 135–145)
Total Bilirubin: 0.8 mg/dL (ref 0.3–1.2)
Total Protein: 5.8 g/dL — ABNORMAL LOW (ref 6.5–8.1)

## 2019-07-30 LAB — CBC WITH DIFFERENTIAL/PLATELET
Abs Immature Granulocytes: 0.05 10*3/uL (ref 0.00–0.07)
Basophils Absolute: 0 10*3/uL (ref 0.0–0.1)
Basophils Relative: 0 %
Eosinophils Absolute: 0.4 10*3/uL (ref 0.0–0.5)
Eosinophils Relative: 5 %
HCT: 34.7 % — ABNORMAL LOW (ref 39.0–52.0)
Hemoglobin: 11.3 g/dL — ABNORMAL LOW (ref 13.0–17.0)
Immature Granulocytes: 1 %
Lymphocytes Relative: 13 %
Lymphs Abs: 1.1 10*3/uL (ref 0.7–4.0)
MCH: 30.6 pg (ref 26.0–34.0)
MCHC: 32.6 g/dL (ref 30.0–36.0)
MCV: 94 fL (ref 80.0–100.0)
Monocytes Absolute: 0.7 10*3/uL (ref 0.1–1.0)
Monocytes Relative: 8 %
Neutro Abs: 6.2 10*3/uL (ref 1.7–7.7)
Neutrophils Relative %: 73 %
Platelets: 314 10*3/uL (ref 150–400)
RBC: 3.69 MIL/uL — ABNORMAL LOW (ref 4.22–5.81)
RDW: 14.6 % (ref 11.5–15.5)
WBC: 8.5 10*3/uL (ref 4.0–10.5)
nRBC: 0 % (ref 0.0–0.2)

## 2019-07-30 LAB — CK: Total CK: 39 U/L — ABNORMAL LOW (ref 49–397)

## 2019-07-30 MED ORDER — HEPARIN SOD (PORK) LOCK FLUSH 100 UNIT/ML IV SOLN
INTRAVENOUS | Status: AC
  Administered 2019-07-30: 500 [IU]
  Filled 2019-07-30: qty 5

## 2019-07-30 MED ORDER — SODIUM CHLORIDE 0.9 % IV SOLN
700.0000 mg | Freq: Once | INTRAVENOUS | Status: DC
  Filled 2019-07-30: qty 14

## 2019-07-30 MED ORDER — SODIUM CHLORIDE 0.9 % IV SOLN
700.0000 mg | Freq: Every day | INTRAVENOUS | Status: AC
  Administered 2019-07-30: 700 mg via INTRAVENOUS
  Filled 2019-07-30: qty 14

## 2019-07-30 NOTE — Telephone Encounter (Addendum)
Spoke with patient. He tolerated infusion well, will pick up cipro today. Faxed most recent labs to [facility]. They have his daptomycin out for delivery. Confirmed 3/22 appointment.

## 2019-07-30 NOTE — Addendum Note (Signed)
Addended by: Landis Gandy on: 07/30/2019 03:33 PM   Modules accepted: Orders

## 2019-07-30 NOTE — Telephone Encounter (Signed)
Thanks Rob and Pickett!

## 2019-07-30 NOTE — Telephone Encounter (Signed)
RN spoke with patient this morning. He was not aware that [facility] or [facility] was trying to reach him, has not checked his voicemails.  RN relayed Dr Henreitta Leber message about stopping vancomycin and meropenem.  He already stopped the meropenem, but did administer the morning dose of Vancomycin.  He is scheduled for 1st dose daptomycin (now at 8mg /kg per [place]) at [place] today at 12, arrive at 11:30.  He accepted the appointment. Cipro was sent to his pharmacy.  [facility] ran insurance coverage, all approved and accepted by patient.  [facility] is requesting baseline labs of CK, CBC with diff, CMP to be drawn before daptomycin infusion today. RN sent orders to short stay.  Patient is coming to clinic 3/22. Will need to confirm that he has lab/picc dressing change orders for outpatient management when he is here for hospital follow up. Landis Gandy, RN

## 2019-07-30 NOTE — Telephone Encounter (Signed)
Thanks again  Hopefully this will be his final change.

## 2019-07-31 LAB — CULTURE, BLOOD (ROUTINE X 2)
Culture: NO GROWTH
Culture: NO GROWTH
Special Requests: ADEQUATE
Special Requests: ADEQUATE

## 2019-08-02 ENCOUNTER — Other Ambulatory Visit: Payer: Self-pay

## 2019-08-02 ENCOUNTER — Other Ambulatory Visit: Payer: Self-pay | Admitting: Infectious Disease

## 2019-08-02 ENCOUNTER — Ambulatory Visit (HOSPITAL_COMMUNITY)
Admission: RE | Admit: 2019-08-02 | Discharge: 2019-08-02 | Disposition: A | Discharge status: Home / Self Care | Payer: Medicare HMO | Source: Ambulatory Visit | Attending: Infectious Disease | Admitting: Infectious Disease

## 2019-08-02 ENCOUNTER — Ambulatory Visit (INDEPENDENT_AMBULATORY_CARE_PROVIDER_SITE_OTHER): Payer: Medicare HMO | Admitting: Infectious Disease

## 2019-08-02 ENCOUNTER — Ambulatory Visit: Payer: Medicare HMO | Attending: Internal Medicine

## 2019-08-02 VITALS — Ht 70.0 in | Wt 201.0 lb

## 2019-08-02 DIAGNOSIS — R262 Difficulty in walking, not elsewhere classified: Secondary | ICD-10-CM | POA: Diagnosis present

## 2019-08-02 DIAGNOSIS — L89203 Pressure ulcer of unspecified hip, stage 3: Secondary | ICD-10-CM | POA: Diagnosis present

## 2019-08-02 DIAGNOSIS — M6281 Muscle weakness (generalized): Secondary | ICD-10-CM | POA: Diagnosis present

## 2019-08-02 DIAGNOSIS — K6812 Psoas muscle abscess: Secondary | ICD-10-CM

## 2019-08-02 DIAGNOSIS — L270 Generalized skin eruption due to drugs and medicaments taken internally: Secondary | ICD-10-CM

## 2019-08-02 DIAGNOSIS — M869 Osteomyelitis, unspecified: Secondary | ICD-10-CM

## 2019-08-02 DIAGNOSIS — U071 COVID-19: Secondary | ICD-10-CM | POA: Diagnosis not present

## 2019-08-02 DIAGNOSIS — A491 Streptococcal infection, unspecified site: Secondary | ICD-10-CM | POA: Insufficient documentation

## 2019-08-02 DIAGNOSIS — G8929 Other chronic pain: Secondary | ICD-10-CM | POA: Diagnosis present

## 2019-08-02 DIAGNOSIS — F4310 Post-traumatic stress disorder, unspecified: Secondary | ICD-10-CM | POA: Diagnosis not present

## 2019-08-02 DIAGNOSIS — M545 Low back pain, unspecified: Secondary | ICD-10-CM

## 2019-08-02 DIAGNOSIS — M4646 Discitis, unspecified, lumbar region: Secondary | ICD-10-CM

## 2019-08-02 MED ORDER — HEPARIN SOD (PORK) LOCK FLUSH 100 UNIT/ML IV SOLN
INTRAVENOUS | Status: DC | PRN
  Administered 2019-08-02: 500 [IU] via INTRAVENOUS

## 2019-08-02 MED ORDER — HEPARIN SODIUM (PORCINE) 1000 UNIT/ML IJ SOLN
INTRAMUSCULAR | Status: AC
  Filled 2019-08-02: qty 1

## 2019-08-02 MED ORDER — HEPARIN SOD (PORK) LOCK FLUSH 100 UNIT/ML IV SOLN
INTRAVENOUS | Status: AC
  Filled 2019-08-02: qty 5

## 2019-08-02 MED ORDER — CIPROFLOXACIN HCL 750 MG PO TABS: 750.0000 mg | ORAL_TABLET | Freq: Two times a day (BID) | ORAL | 1 refills | Status: DC

## 2019-08-02 MED ORDER — LIDOCAINE HCL 1 % IJ SOLN
INTRAMUSCULAR | Status: AC
  Filled 2019-08-02: qty 20

## 2019-08-02 MED ORDER — LIDOCAINE HCL 1 % IJ SOLN
INTRAMUSCULAR | Status: DC | PRN
  Administered 2019-08-02: 10 mL

## 2019-08-02 MED ORDER — DOXYCYCLINE HYCLATE 100 MG PO TABS: 100.0000 mg | ORAL_TABLET | Freq: Two times a day (BID) | ORAL | 1 refills | Status: DC

## 2019-08-02 MED ORDER — HEPARIN SOD (PORK) LOCK FLUSH 100 UNIT/ML IV SOLN: INTRAVENOUS | Status: DC | PRN

## 2019-08-02 NOTE — Progress Notes (Signed)
Per verbal order from Dr Tommy Medal, RN drew labs and changed dressing for PICC as dressing was peeling off.  PICC pulled out 3 cm while changing dressing, 39 cm Single Lumen Peripherally Inserted Central Catheter removed from right basilic, tip intact. No sutures present. RN confirmed length per chart. Dressing was clean and dry. Petroleum dressing applied. Pt advised no heavy lifting with this arm, leave dressing for 24 hours and call the office or seek emergent care if dressing becomes soaked with blood or sharp pain presents. Patient verbalized understanding and agreement.   RN spoke with Anderson Malta at [facility] scheduling. Patient will be worked in for PICC replacement today. Landis Gandy, RN

## 2019-08-02 NOTE — Procedures (Signed)
Successful placement of single lumen PICC line to right brachial vein. Length 41cm Tip at lower SVC/RA PICC capped No complications Ready for use.  EBL < 5 mL  If PICC is retracted again in the future, would leave in place for exchange if needed.   Docia Barrier PA-C 08/02/2019 1:15 PM
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
## 2019-08-03 LAB — BASIC METABOLIC PANEL WITH GFR
BUN/Creatinine Ratio: 39 (calc) — ABNORMAL HIGH (ref 6–22)
BUN: 36 mg/dL — ABNORMAL HIGH (ref 7–25)
CO2: 25 mmol/L (ref 20–32)
Calcium: 8.8 mg/dL (ref 8.6–10.3)
Chloride: 107 mmol/L (ref 98–110)
Creat: 0.93 mg/dL (ref 0.70–1.18)
GFR, Est African American: 95 mL/min/{1.73_m2} (ref >=60)
GFR, Est Non African American: 82 mL/min/{1.73_m2} (ref >=60)
Glucose, Bld: 109 mg/dL — ABNORMAL HIGH (ref 65–99)
Potassium: 4.7 mmol/L (ref 3.5–5.3)
Sodium: 139 mmol/L (ref 135–146)

## 2019-08-03 LAB — CK: Total CK: 47 U/L (ref 44–196)

## 2019-08-03 LAB — C-REACTIVE PROTEIN: CRP: 25.7 mg/L — ABNORMAL HIGH (ref <8.0)

## 2019-08-03 LAB — SEDIMENTATION RATE: Sed Rate: 33 mm/h — ABNORMAL HIGH (ref 0–20)
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
## 2019-08-03 NOTE — Patient Instructions (Signed)
HEP for gradual progressive strengthening. Walking extended distances as tolerated 2-3x a day. STS every 2 -3 hours, 3-5 reps as tolerated.  Ankle pumps, side steps, and mini-squats 5-10 reps 1-2 x a day as tolerated.

## 2019-08-04 ENCOUNTER — Telehealth: Payer: Self-pay | Admitting: Family Medicine

## 2019-08-04 NOTE — Telephone Encounter (Signed)
Form completed signed, in side wall pocket.  [name], MD  Family Medicine Teaching Service

## 2019-08-04 NOTE — Telephone Encounter (Signed)
Pt. Submitted [place] Placard form to be completed.  Last appt. 08/20/19 Form placed in red team folder

## 2019-08-04 NOTE — Telephone Encounter (Signed)
Placed form for Handicap Placard in PCP's box for completion.  Ivan [name], [place]

## 2019-08-05 ENCOUNTER — Other Ambulatory Visit: Payer: Self-pay

## 2019-08-05 ENCOUNTER — Encounter (HOSPITAL_BASED_OUTPATIENT_CLINIC_OR_DEPARTMENT_OTHER): Payer: Medicare HMO | Admitting: Internal Medicine

## 2019-08-05 DIAGNOSIS — L89224 Pressure ulcer of left hip, stage 4: Secondary | ICD-10-CM | POA: Diagnosis not present

## 2019-08-05 NOTE — Progress Notes (Signed)
Ivan [name], Ivan [name] ([id number]) Visit Report for 08/05/2019 Debridement Details Patient Name: Date of Service: Ivan [name], Ivan [name] 08/05/2019 2:30 PM Medical Record [id number] Patient Account Number: [id number] Date of Birth/Sex: May 01, 1946 (73 y.o. M) Treating RN: Ivan [name] Primary Care Provider: Dorris [name] Other Clinician: Referring Provider: Treating Provider/Extender:Ivan [name], Ivan [name], Ivan [name] in Treatment: 1 Debridement Performed for Wound #1 Left Trochanter Assessment: Performed By: Physician Ivan [name]., MD Debridement Type: Debridement Level of Consciousness (Pre- Awake and Alert procedure): Pre-procedure Yes - 15:35 Verification/Time Out Taken: Start Time: 15:36 Pain Control: Lidocaine 4% Topical Solution Total Area Debrided (L x W): 6 (cm) x 4.2 (cm) = 25.2 (cm) Tissue and other material Viable, Non-Viable, Fat, Muscle, Slough, Subcutaneous, Skin: Dermis , debrided: Fibrin/Exudate, Slough Level: Skin/Subcutaneous Tissue/Muscle Debridement Description: Excisional Instrument: Blade, Curette, Forceps Bleeding: Minimum Hemostasis Achieved: Pressure End Time: 15:43 Procedural Pain: 0 Post Procedural Pain: 0 Response to Treatment: Procedure was tolerated well Level of Consciousness Awake and Alert (Post-procedure): Post Debridement Measurements of Total Wound Length: (cm) 6 Stage: Category/Stage IV Width: (cm) 4.2 Depth: (cm) 2 Volume: (cm) 39.584 Character of Wound/Ulcer Post Requires Further Debridement Debridement: Post Procedure Diagnosis Same as Pre-procedure Electronic Signature(s) Signed: 08/05/2019 6:03:33 PM By: Ivan Ham MD Signed: 08/05/2019 6:22:19 PM By: Ivan [name] Entered By: Ivan [name] on 08/05/2019 15:43:34 -------------------------------------------------------------------------------- HPI Details Patient Name: Date of Service: Ivan [name] 08/05/2019 2:30 PM Medical Record [id number] Patient  Account Number: [id number] Date of Birth/Sex: Treating RN: 1945-11-29 (74 y.o. 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 He required admission to hospital from 1/5 through 1/9 with Covid pneumonia. At that point he apparently left AMA. He was noted to have a left hip bruise at the time of discharge. I suspect this really was a deep tissue injury. He was readmitted from 1/13 through 1//27 with altered mental status Covid pneumonia and delirium. At that point he had a left hip and thigh wound which had worsened. Felt to be a pressure injury. He was seen by general surgery who recommended normal saline wet-to-dry twice daily he did not have a debridement. After he left at that point he was incapacitated by increasing lumbar pain. An MRI on 2/9 showed discitis and osteomyelitis at L1/2. He asked aspiration of 1 cm fluid however I think this culture negative. He was also noted at this point to have a very large wound over the left greater trochanter. He was taken to the OR by Ivan [name] for debridement with ACell placement. He was placed in a wound VAC. A culture at that point showed Enterococcus and Enterobacter. An MRI on 3/15 showed no evidence of left hip septic arthritis or osteomyelitis. Extensive subcutaneous edema within the visualized soft tissue of the left thigh and prominent left inguinal lymph nodes likely reactive. He was started on IV cefepime and vancomycin for 6 [name]. He was discharged to [facility] facility as the patient. There I gather the wound developed the necrotic surface. He was seen by the wound care doctor in house who correctly pointed out that a wound VAC would not do anything with a  necrotic surface. Since then he has been using Santyl The patient is now at home. He was on vancomycin and cefepime. He developed hives and a generalized rash and was  seen in the ER on 3/15 by Ivan [name] recommendation he was changed to meropenem however he also apparently developed hives after this. He says the original hives stopped after the cefepime was stopped and follow- up hives only developed after the meropenem. He is only taken 1 dose of the fear of the meropenem and now is on vancomycin. 3/25; the patient was seen by Ivan [name] on 3/22. Because of intolerances to meropenem which I communicated with Ivan [name] about he is now on daptomycin and Cipro until sometime in early April. He seems to be tolerating the antibiotics well. We are using Santyl back by wet-to-dry dressings. And making some improvement in the general surface of this very large wound Electronic Signature(s) Signed: 08/05/2019 6:03:33 PM By: Ivan Ham MD Entered By: Ivan [name] on 08/05/2019 16:42:17 -------------------------------------------------------------------------------- Physical Exam Details Patient Name: Date of Service: Ivan [name] 08/05/2019 2:30 PM Medical Record [id number] Patient Account Number: [id number] Date of Birth/Sex: Treating RN: 04-Aug-1945 (74 y.o. Ivan [name] Primary Care Provider: Dorris [name] Other Clinician: Referring Provider: Treating Provider/Extender:Ivan [name], Ivan [name], Ivan [name] in Treatment: 1 Constitutional Sitting or standing Blood Pressure is within target range for patient.. Pulse regular and within target range for patient.Marland Kitchen Respirations regular, non-labored and within target range.. Temperature is normal and within the target range for the patient.Marland Kitchen Appears in no distress. Notes Wound exam; the areas over the left greater trochanter. Still requiring an aggressive debridement although none he has better looking tissue than last time.  Using a #10 scalpel and pickups I remove necrotic fat and probably muscle especially from a tunneling area from 11-1 o'clock. After this was finished we cleaned up the tissue from the wound surfaces with a #5 curette. Hemostasis with direct pressure. Fortunately there is no evidence of surrounding infection no soft tissue crepitus. Electronic Signature(s) Signed: 08/05/2019 6:03:33 PM By: Ivan Ham MD Entered By: Ivan [name] on 08/05/2019 16:43:41 -------------------------------------------------------------------------------- Physician Orders Details Patient Name: Date of Service: Ivan [name] 08/05/2019 2:30 PM Medical Record [id number] Patient Account Number: [id number] Date of Birth/Sex: Treating RN: [birth year] (74 y.o. Ivan [name] Primary Care Provider: Dorris [name] Other Clinician: Referring Provider: Treating Provider/Extender:Koree Staheli, Ivan [name], Ivan [name] in Treatment: 1 Verbal / Phone Orders: No Diagnosis Coding ICD-10 Coding Code Description L89.214 Pressure ulcer of right hip, stage 4 Disruption of external operation (surgical) wound, not elsewhere classified, subsequent T81.31XD encounter Follow-up Appointments Return Appointment in 2 [name]. - Thursday Dressing Change Frequency Change dressing every day. Skin Barriers/Peri-Wound Care Skin Prep Wound Cleansing Clean wound with Wound Cleanser Primary Wound Dressing Wound #1 Left Trochanter Santyl Ointment - backed with saline moisten gauze. Secondary Dressing Foam Border - or bordered gauze. Off-Loading Other: - stay off of left hip to aid in offloading pressure. Electronic Signature(s) Signed: 08/05/2019 6:03:33 PM By: Ivan Ham MD Signed: 08/05/2019 6:22:19 PM By: Ivan [name] Entered By: Ivan [name] on 08/05/2019 15:44:33 -------------------------------------------------------------------------------- Problem List Details Patient Name: Date of Service: Ivan [name] 08/05/2019 2:30 PM Medical Record [id number] Patient Account Number: [id number] Date of Birth/Sex: Treating RN: Jan 08, 1946 (74 y.o. Ivan [name] Primary Care Provider: Dorris [name] Other Clinician: Referring Provider: Treating Provider/Extender:Terryn Redner, Ivan [name], Ivan [name] in Treatment: 1 Active Problems ICD-10 Evaluated Encounter Code Description Active Date Today Diagnosis L89.224 Pressure ulcer of left hip, stage 4 08/05/2019 No Yes T81.31XD Disruption of external operation (surgical) wound, not 07/29/2019 No Yes elsewhere classified, subsequent encounter Inactive Problems Resolved Problems Electronic Signature(s) Signed: 08/05/2019 6:03:33 PM By:  Ivan Ham MD Entered By: Ivan [name] on 08/05/2019 16:39:18 -------------------------------------------------------------------------------- Progress Note Details Patient Name: Date of Service: Ivan [name], Ivan [name] 08/05/2019 2:30 PM Medical Record [id number] Patient Account Number: [id number] Date of Birth/Sex: Treating RN: 02/26/1946 (74 y.o. 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 He required admission to hospital from 1/5 through 1/9 with Covid pneumonia. At that point he apparently left AMA. He was noted to have a left hip bruise at the time of discharge. I suspect this really was a deep tissue injury. He was readmitted from 1/13 through 1//27 with altered mental status Covid pneumonia and delirium. At that point he had a left hip and thigh wound which had worsened. Felt to be a pressure injury. He was seen by general surgery who recommended normal saline wet-to-dry twice daily he did not have a debridement.  After he left at that point he was incapacitated by increasing lumbar pain. An MRI on 2/9 showed discitis and osteomyelitis at L1/2. He asked aspiration of 1 cm fluid however I think this culture negative. He was also noted at this point to have a very large wound over the left greater trochanter. He was taken to the OR by Ivan [name] for debridement with ACell placement. He was placed in a wound VAC. A culture at that point showed Enterococcus and Enterobacter. An MRI on 3/15 showed no evidence of left hip septic arthritis or osteomyelitis. Extensive subcutaneous edema within the visualized soft tissue of the left thigh and prominent left inguinal lymph nodes likely reactive. He was started on IV cefepime and vancomycin for 6 [name]. He was discharged to [facility] facility as the patient. There I gather the wound developed the necrotic surface. He was seen by the wound care doctor in house who correctly pointed out that a wound VAC would not do anything with a necrotic surface. Since then he has been using Santyl The patient is now at home. He was on vancomycin and cefepime. He developed hives and a generalized rash and was seen in the ER on 3/15 by Ivan [name] recommendation he was changed to meropenem however he also apparently developed hives after this. He says the original hives stopped after the cefepime was stopped and follow- up hives only developed after the meropenem. He is only taken 1 dose of the fear of the meropenem and now is on vancomycin. 3/25; the patient was seen by Ivan [name] on 3/22. Because of intolerances to meropenem which I communicated with Ivan [name] about he is now on daptomycin and Cipro until sometime in early April. He seems to be tolerating the antibiotics well. We are using Santyl back by wet-to-dry dressings. And making some improvement in the general surface of this very large wound Objective Constitutional Sitting or standing Blood Pressure is within target  range for patient.. Pulse regular and within target range for patient.Marland Kitchen Respirations regular, non-labored and within target range.. Temperature is normal and within the target range for the patient.Marland Kitchen Appears in no distress. Vitals Time Taken: 3:00 PM, Height: 70 in, Weight: 198 lbs, BMI: 28.4, Temperature: 98.1 F, Pulse: 84 bpm, Respiratory Rate: 20 breaths/min, Blood Pressure: 133/66 mmHg. General Notes: Wound exam; the areas over the left greater trochanter. Still requiring an aggressive debridement although none he has better looking tissue  than last time. Using a #10 scalpel and pickups I remove necrotic fat and probably muscle especially from a tunneling area from 11-1 o'clock. After this was finished we cleaned up the tissue from the wound surfaces with a #5 curette. Hemostasis with direct pressure. Fortunately there is no evidence of surrounding infection no soft tissue crepitus. Integumentary (Hair, Skin) Wound #1 status is Open. Original cause of wound was Surgical Injury. The wound is located on the Left Trochanter. The wound measures 6cm length x 4.2cm width x 2cm depth; 19.792cm^2 area and 39.584cm^3 volume. There is muscle and Fat Layer (Subcutaneous Tissue) Exposed exposed. There is no tunneling or undermining noted. There is a large amount of purulent drainage noted. The wound margin is well defined and not attached to the wound base. There is small (1-33%) pink granulation within the wound bed. There is a large (67- 100%) amount of necrotic tissue within the wound bed including Adherent Slough. Assessment Active Problems ICD-10 Pressure ulcer of left hip, stage 4 Disruption of external operation (surgical) wound, not elsewhere classified, subsequent encounter Procedures Wound #1 Pre-procedure diagnosis of Wound #1 is a Pressure Ulcer located on the Left Trochanter . There was a Excisional Skin/Subcutaneous Tissue/Muscle Debridement with a total area of 25.2 sq cm performed  by Ivan [name]., MD. With the following instrument(s): Blade, Curette, and Forceps to remove Viable and Non-Viable tissue/material. Material removed includes Fat, Muscle, Subcutaneous Tissue, Slough, Skin: Dermis, and Fibrin/Exudate after achieving pain control using Lidocaine 4% Topical Solution. A time out was conducted at 15:35, prior to the start of the procedure. A Minimum amount of bleeding was controlled with Pressure. The procedure was tolerated well with a pain level of 0 throughout and a pain level of 0 following the procedure. Post Debridement Measurements: 6cm length x 4.2cm width x 2cm depth; 39.584cm^3 volume. Post debridement Stage noted as Category/Stage IV. Character of Wound/Ulcer Post Debridement requires further debridement. Post procedure Diagnosis Wound #1: Same as Pre-Procedure Plan Follow-up Appointments: Return Appointment in 2 [name]. - Thursday Dressing Change Frequency: Change dressing every day. Skin Barriers/Peri-Wound Care: Skin Prep Wound Cleansing: Clean wound with Wound Cleanser Primary Wound Dressing: Wound #1 Left Trochanter: Santyl Ointment - backed with saline moisten gauze. Secondary Dressing: Foam Border - or bordered gauze. Off-Loading: Other: - stay off of left hip to aid in offloading pressure. 1. I am continuing with the Santyl back with normal saline wet-to-dry 2. He is treatment with IV antibiotics both for the combination of discitis and osteomyelitis at L1 to with a 1 cm left psoas abscess. As well as infection in the deep ulcer on the left greater trochanter. None operative culture grew Enterococcus and Enterobacter. 3. The focus here will be to get closer to completion of this antibiotics. Also to continue with both mechanical debridement and enzymatic debridement to get a better looking wound surface especially in the undermining area. The goal will be to get to a point where we can place a wound VAC and I talked to him about this  today. Electronic Signature(s) Signed: 08/05/2019 6:03:33 PM By: Ivan Ham MD Entered By: Ivan [name] on 08/05/2019 16:45:58 -------------------------------------------------------------------------------- SuperBill Details Patient Name: Date of Service: Ivan [name] 08/05/2019 Medical Record [id number] Patient Account Number: [id number] Date of Birth/Sex: Treating RN: October 26, 1945 (74 y.o. Ivan [name] Primary Care Provider: Dorris [name] Other Clinician: Referring Provider: Treating Provider/Extender:Tisa Weisel, Ivan [name], Ivan [name] in Treatment: 1 Diagnosis Coding ICD-10 Codes Code Description [phone] Pressure ulcer of left hip, stage  4 Disruption of external operation (surgical) wound, not elsewhere classified, subsequent T81.31XD encounter Facility Procedures The patient participates with Medicare or their insurance follows the Medicare Facility Guidelines: CPT4 Description Modifier Quantity Code [id number] - DEB MUSC/FASCIA 20 SQ CM/< 1 ICD-10 Diagnosis Description T81.31XD Disruption of external  operation (surgical) wound, not elsewhere classified, subsequent encounter The patient participates with Medicare or their insurance follows the Medicare Facility Guidelines: [id number] [place] 20 CM 1 ICD-10 Diagnosis Description L89.224 Pressure ulcer of left hip, stage 4 Physician Procedures CPT4: Description Modifier Quantity Description Modifier Quantity Code [id number] - WC PHYS DEBR MUSCLE/FASCIA 20 SQ CM 1 ICD-10 Diagnosis Description T81.31XD Disruption of external operation (surgical) wound, not elsewhere classified, subsequent  encounter CPT4: [id number] - WC PHYS DEB MUSC/FASC EA ADDL 20 CM 1 ICD-10 Diagnosis Description L89.224 Pressure ulcer of left hip, stage 4 Electronic Signature(s) Signed: 08/05/2019 6:03:33 PM By: Ivan Ham MD Entered By: Ivan [name] on 08/05/2019 16:46:12

## 2019-08-05 NOTE — Telephone Encounter (Signed)
Patient contacted and form up front for pick up.

## 2019-08-06 ENCOUNTER — Other Ambulatory Visit (HOSPITAL_COMMUNITY): Payer: Self-pay | Admitting: *Deleted

## 2019-08-06 ENCOUNTER — Telehealth: Payer: Self-pay

## 2019-08-06 NOTE — Telephone Encounter (Signed)
Received call from [place] at [facility] Stay for orders. RN has orders will fax to short stay. [place]

## 2019-08-09 ENCOUNTER — Other Ambulatory Visit: Payer: Self-pay

## 2019-08-09 ENCOUNTER — Ambulatory Visit (HOSPITAL_COMMUNITY)
Admission: RE | Admit: 2019-08-09 | Discharge: 2019-08-09 | Disposition: A | Discharge status: Home / Self Care | Payer: Medicare HMO | Source: Ambulatory Visit | Attending: Infectious Disease | Admitting: Infectious Disease

## 2019-08-09 DIAGNOSIS — M869 Osteomyelitis, unspecified: Secondary | ICD-10-CM | POA: Diagnosis not present

## 2019-08-09 LAB — CBC WITH DIFFERENTIAL/PLATELET
Abs Immature Granulocytes: 0.05 10*3/uL (ref 0.00–0.07)
Basophils Absolute: 0 10*3/uL (ref 0.0–0.1)
Basophils Relative: 0 %
Eosinophils Absolute: 0.3 10*3/uL (ref 0.0–0.5)
Eosinophils Relative: 3 %
HCT: 37.2 % — ABNORMAL LOW (ref 39.0–52.0)
Hemoglobin: 12.1 g/dL — ABNORMAL LOW (ref 13.0–17.0)
Immature Granulocytes: 0 %
Lymphocytes Relative: 7 %
Lymphs Abs: 0.9 10*3/uL (ref 0.7–4.0)
MCH: 30.6 pg (ref 26.0–34.0)
MCHC: 32.5 g/dL (ref 30.0–36.0)
MCV: 93.9 fL (ref 80.0–100.0)
Monocytes Absolute: 0.8 10*3/uL (ref 0.1–1.0)
Monocytes Relative: 7 %
Neutro Abs: 10 10*3/uL — ABNORMAL HIGH (ref 1.7–7.7)
Neutrophils Relative %: 83 %
Platelets: 328 10*3/uL (ref 150–400)
RBC: 3.96 MIL/uL — ABNORMAL LOW (ref 4.22–5.81)
RDW: 15.9 % — ABNORMAL HIGH (ref 11.5–15.5)
WBC: 12.1 10*3/uL — ABNORMAL HIGH (ref 4.0–10.5)
nRBC: 0 % (ref 0.0–0.2)

## 2019-08-09 LAB — BASIC METABOLIC PANEL
Anion gap: 12 (ref 5–15)
BUN: 24 mg/dL — ABNORMAL HIGH (ref 8–23)
CO2: 22 mmol/L (ref 22–32)
Calcium: 8.9 mg/dL (ref 8.9–10.3)
Chloride: 101 mmol/L (ref 98–111)
Creatinine, Ser: 0.7 mg/dL (ref 0.61–1.24)
GFR calc Af Amer: >60 mL/min (ref >60)
GFR calc non Af Amer: >60 mL/min (ref >60)
Glucose, Bld: 113 mg/dL — ABNORMAL HIGH (ref 70–99)
Potassium: 4.7 mmol/L (ref 3.5–5.1)
Sodium: 135 mmol/L (ref 135–145)

## 2019-08-09 LAB — CK: Total CK: 71 U/L (ref 49–397)

## 2019-08-09 LAB — SEDIMENTATION RATE: Sed Rate: 40 mm/hr — ABNORMAL HIGH (ref 0–16)

## 2019-08-09 LAB — C-REACTIVE PROTEIN: CRP: 1.3 mg/dL — ABNORMAL HIGH (ref <1.0)

## 2019-08-09 MED ORDER — HEPARIN SOD (PORK) LOCK FLUSH 100 UNIT/ML IV SOLN
INTRAVENOUS | Status: AC
  Administered 2019-08-09: 250 [IU] via INTRAVENOUS
  Filled 2019-08-09: qty 5

## 2019-08-09 MED ORDER — HEPARIN SOD (PORK) LOCK FLUSH 100 UNIT/ML IV SOLN: 250.0000 [IU] | Freq: Once | INTRAVENOUS | Status: DC

## 2019-08-10 ENCOUNTER — Telehealth: Payer: Self-pay | Admitting: *Deleted

## 2019-08-10 NOTE — Telephone Encounter (Signed)
RN faxed this week's lab results to [place]. Landis Gandy, RN

## 2019-08-11 ENCOUNTER — Telehealth: Payer: Self-pay | Admitting: Family Medicine

## 2019-08-11 NOTE — Telephone Encounter (Signed)
Reviewed disability form and placed in PCP's box for completion.  Ozella Almond, [place]

## 2019-08-11 NOTE — Telephone Encounter (Signed)
Short term disability extension form dropped off at front desk for completion.  Verified that patient section of form has been completed.  Last DOS/WCC with PCP was 07-26-2019.  Placed form in RED team folder to be completed by clinical staff.  Richrd Humbles

## 2019-08-12 NOTE — Telephone Encounter (Signed)
Form completed, in side wall pocket with most recent office note.  [name], MD  Family Medicine Teaching Service

## 2019-08-16 ENCOUNTER — Ambulatory Visit (HOSPITAL_COMMUNITY)
Admission: RE | Admit: 2019-08-16 | Discharge: 2019-08-16 | Disposition: A | Discharge status: Home / Self Care | Payer: Medicare HMO | Source: Ambulatory Visit | Attending: Infectious Disease | Admitting: Infectious Disease

## 2019-08-16 ENCOUNTER — Encounter: Payer: Self-pay | Admitting: Physical Therapy

## 2019-08-16 ENCOUNTER — Telehealth: Payer: Self-pay

## 2019-08-16 ENCOUNTER — Ambulatory Visit: Payer: Medicare HMO | Attending: Internal Medicine | Admitting: Physical Therapy

## 2019-08-16 ENCOUNTER — Other Ambulatory Visit: Payer: Self-pay

## 2019-08-16 DIAGNOSIS — R262 Difficulty in walking, not elsewhere classified: Secondary | ICD-10-CM | POA: Diagnosis present

## 2019-08-16 DIAGNOSIS — A491 Streptococcal infection, unspecified site: Secondary | ICD-10-CM | POA: Diagnosis present

## 2019-08-16 DIAGNOSIS — L89203 Pressure ulcer of unspecified hip, stage 3: Secondary | ICD-10-CM | POA: Insufficient documentation

## 2019-08-16 DIAGNOSIS — M545 Low back pain, unspecified: Secondary | ICD-10-CM

## 2019-08-16 DIAGNOSIS — U071 COVID-19: Secondary | ICD-10-CM

## 2019-08-16 DIAGNOSIS — G8929 Other chronic pain: Secondary | ICD-10-CM | POA: Insufficient documentation

## 2019-08-16 DIAGNOSIS — M869 Osteomyelitis, unspecified: Secondary | ICD-10-CM | POA: Insufficient documentation

## 2019-08-16 DIAGNOSIS — M6281 Muscle weakness (generalized): Secondary | ICD-10-CM | POA: Diagnosis present

## 2019-08-16 LAB — CBC WITH DIFFERENTIAL/PLATELET
Abs Immature Granulocytes: 0.02 10*3/uL (ref 0.00–0.07)
Basophils Absolute: 0 10*3/uL (ref 0.0–0.1)
Basophils Relative: 0 %
Eosinophils Absolute: 1.2 10*3/uL — ABNORMAL HIGH (ref 0.0–0.5)
Eosinophils Relative: 14 %
HCT: 37.3 % — ABNORMAL LOW (ref 39.0–52.0)
Hemoglobin: 11.9 g/dL — ABNORMAL LOW (ref 13.0–17.0)
Immature Granulocytes: 0 %
Lymphocytes Relative: 8 %
Lymphs Abs: 0.7 10*3/uL (ref 0.7–4.0)
MCH: 31.1 pg (ref 26.0–34.0)
MCHC: 31.9 g/dL (ref 30.0–36.0)
MCV: 97.4 fL (ref 80.0–100.0)
Monocytes Absolute: 1 10*3/uL (ref 0.1–1.0)
Monocytes Relative: 11 %
Neutro Abs: 6 10*3/uL (ref 1.7–7.7)
Neutrophils Relative %: 67 %
Platelets: 267 10*3/uL (ref 150–400)
RBC: 3.83 MIL/uL — ABNORMAL LOW (ref 4.22–5.81)
RDW: 16.4 % — ABNORMAL HIGH (ref 11.5–15.5)
WBC: 8.9 10*3/uL (ref 4.0–10.5)
nRBC: 0 % (ref 0.0–0.2)

## 2019-08-16 LAB — SEDIMENTATION RATE: Sed Rate: 53 mm/hr — ABNORMAL HIGH (ref 0–16)

## 2019-08-16 LAB — BASIC METABOLIC PANEL
Anion gap: 10 (ref 5–15)
BUN: 24 mg/dL — ABNORMAL HIGH (ref 8–23)
CO2: 25 mmol/L (ref 22–32)
Calcium: 9 mg/dL (ref 8.9–10.3)
Chloride: 101 mmol/L (ref 98–111)
Creatinine, Ser: 0.78 mg/dL (ref 0.61–1.24)
GFR calc Af Amer: >60 mL/min (ref >60)
GFR calc non Af Amer: >60 mL/min (ref >60)
Glucose, Bld: 101 mg/dL — ABNORMAL HIGH (ref 70–99)
Potassium: 3.9 mmol/L (ref 3.5–5.1)
Sodium: 136 mmol/L (ref 135–145)

## 2019-08-16 LAB — CK: Total CK: 50 U/L (ref 49–397)

## 2019-08-16 LAB — C-REACTIVE PROTEIN: CRP: 8.7 mg/dL — ABNORMAL HIGH (ref <1.0)

## 2019-08-16 MED ORDER — HEPARIN SOD (PORK) LOCK FLUSH 100 UNIT/ML IV SOLN
INTRAVENOUS | Status: AC
  Administered 2019-08-16: 250 [IU] via INTRAVENOUS
  Filled 2019-08-16: qty 5

## 2019-08-16 MED ORDER — HEPARIN SOD (PORK) LOCK FLUSH 100 UNIT/ML IV SOLN: 250.0000 [IU] | Freq: Once | INTRAVENOUS | Status: AC

## 2019-08-16 NOTE — Telephone Encounter (Signed)
While his inflammatory markers are still up but he may need further surgery I am afriad to the pressure ulcer  I was going to swtich to po abx but we can extend him out another month if his insurance will cover it  Getting seen re his decubitus should be a high priority

## 2019-08-16 NOTE — Telephone Encounter (Signed)
Please advise on new end date for antibiotics or if oral antibiotics will be sent in. [place]

## 2019-08-16 NOTE — Telephone Encounter (Signed)
If patient is willing let's go to 5/9
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
## 2019-08-16 NOTE — Telephone Encounter (Signed)
Received call from [facility] at [place] to confirm patient's end date. States last order antibiotic end date is 4/9. Would like to know if this is still accurate or if they would need to extend therapy. Would also like to know if office will be pulling picc.  P: [phone] option 4 [place] Rud, [place]

## 2019-08-17 NOTE — Telephone Encounter (Signed)
Attempted to call patient regarding antibiotic extension. Left voicemail requesting he call office back. Will need to ask patient if IV extension is okay. [place]

## 2019-08-18 NOTE — Telephone Encounter (Signed)
Received call from [facility] about end date of antibiotics. Was able to connect with patient and confirm that he is ok with continuing IV antibiotics for another month. Will provide new end date for dapto.   Patient wanted clarification about his oral antibiotics and what the regimen should be/when he should start them. Thanks!  Zenda Herskowitz Lorita Officer, RN

## 2019-08-18 NOTE — Telephone Encounter (Signed)
Thanks so much Dorie!

## 2019-08-19 ENCOUNTER — Ambulatory Visit: Payer: Medicare HMO

## 2019-08-19 ENCOUNTER — Other Ambulatory Visit: Payer: Self-pay

## 2019-08-19 ENCOUNTER — Encounter (HOSPITAL_BASED_OUTPATIENT_CLINIC_OR_DEPARTMENT_OTHER): Payer: Medicare HMO | Attending: Internal Medicine | Admitting: Internal Medicine

## 2019-08-19 DIAGNOSIS — L89224 Pressure ulcer of left hip, stage 4: Secondary | ICD-10-CM | POA: Insufficient documentation

## 2019-08-19 DIAGNOSIS — M869 Osteomyelitis, unspecified: Secondary | ICD-10-CM | POA: Insufficient documentation

## 2019-08-19 DIAGNOSIS — I10 Essential (primary) hypertension: Secondary | ICD-10-CM | POA: Insufficient documentation

## 2019-08-19 DIAGNOSIS — X58XXXA Exposure to other specified factors, initial encounter: Secondary | ICD-10-CM | POA: Insufficient documentation

## 2019-08-19 DIAGNOSIS — Z8616 Personal history of COVID-19: Secondary | ICD-10-CM | POA: Diagnosis not present

## 2019-08-19 DIAGNOSIS — Z881 Allergy status to other antibiotic agents status: Secondary | ICD-10-CM | POA: Diagnosis not present

## 2019-08-19 DIAGNOSIS — T8131XD Disruption of external operation (surgical) wound, not elsewhere classified, subsequent encounter: Secondary | ICD-10-CM | POA: Diagnosis not present

## 2019-08-19 DIAGNOSIS — Z91041 Radiographic dye allergy status: Secondary | ICD-10-CM | POA: Insufficient documentation

## 2019-08-19 DIAGNOSIS — I252 Old myocardial infarction: Secondary | ICD-10-CM | POA: Diagnosis not present

## 2019-08-19 DIAGNOSIS — M1990 Unspecified osteoarthritis, unspecified site: Secondary | ICD-10-CM | POA: Insufficient documentation

## 2019-08-19 DIAGNOSIS — Z888 Allergy status to other drugs, medicaments and biological substances status: Secondary | ICD-10-CM | POA: Diagnosis not present

## 2019-08-19 NOTE — Progress Notes (Signed)
Ivan [name], Ivan [name] ([id number]) Visit Report for 08/19/2019 Debridement Details Patient Name: Date of Service: Ivan [name], Ivan [name] 08/19/2019 2:30 PM Medical Record [id number] Patient Account Number: [id number] Date of Birth/Sex: Treating RN: [birth year] (74 y.o. Ivan [name] Primary Care Provider: [name] Other Clinician: Referring Provider: Treating Provider/Extender:[name], [name], Carina Weeks in Treatment: 3 Debridement Performed for Wound #1 Left Trochanter Assessment: Performed By: Physician [name]., MD Debridement Type: Debridement Level of Consciousness (Pre- Awake and Alert procedure): Pre-procedure Yes - 15:05 Verification/Time Out Taken: Start Time: 15:06 Pain Control: Lidocaine 4% Topical Solution Total Area Debrided (L x W): 4.7 (cm) x 3 (cm) = 14.1 (cm) Tissue and other material Viable, Non-Viable, Muscle, Slough, Subcutaneous, Skin: Dermis , Fibrin/Exudate, debrided: Slough Level: Skin/Subcutaneous Tissue/Muscle Debridement Description: Excisional Instrument: Blade, Curette Bleeding: Minimum Hemostasis Achieved: Pressure End Time: 15:09 Procedural Pain: 0 Post Procedural Pain: 0 Response to Treatment: Procedure was tolerated well Level of Consciousness Awake and Alert (Post-procedure): Post Debridement Measurements of Total Wound Length: (cm) 4.7 Stage: Category/Stage IV Width: (cm) 4.5 Depth: (cm) 3.6 Volume: (cm) 59.8 Character of Wound/Ulcer Post Requires Further Debridement Debridement: Post Procedure Diagnosis Same as Pre-procedure Electronic Signature(s) Signed: 08/19/2019 5:58:56 PM By: [name] MD Signed: 08/19/2019 6:08:37 PM By: [name] Entered By: [name] on 08/19/2019 15:15:35 -------------------------------------------------------------------------------- HPI Details Patient Name: Date of Service: Ivan [name] 08/19/2019 2:30 PM Medical Record [id number] Patient Account Number:  [id number] Date of Birth/Sex: Treating RN: Feb 01, 1946 (74 y.o. 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 He required admission to hospital from 1/5 through 1/9 with Covid pneumonia. At that point he apparently left AMA. He was noted to have a left hip bruise at the time of discharge. I suspect this really was a deep tissue injury. He was readmitted from 1/13 through 1//27 with altered mental status Covid pneumonia and delirium. At that point he had a left hip and thigh wound which had worsened. Felt to be a pressure injury. He was seen by general surgery who recommended normal saline wet-to-dry twice daily he did not have a debridement. After he left at that point he was incapacitated by increasing lumbar pain. An MRI on 2/9 showed discitis and osteomyelitis at L1/2. He asked aspiration of 1 cm fluid however I think this culture negative. He was also noted at this point to have a very large wound over the left greater trochanter. He was taken to the OR by Dr. Marla Roe for debridement with ACell placement. He was placed in a wound VAC. A culture at that point showed Enterococcus and Enterobacter. An MRI on 3/15 showed no evidence of left hip septic arthritis or osteomyelitis. Extensive subcutaneous edema within the visualized soft tissue of the left thigh and prominent left inguinal lymph nodes likely reactive. He was started on IV cefepime and vancomycin for 6 weeks. He was discharged to [facility] facility as the patient. There I gather the wound developed the necrotic surface. He was seen by the wound care doctor in house who correctly pointed out that a wound VAC would not do anything with a necrotic surface. Since  then he has been using Santyl The patient is now at home. He was on vancomycin and cefepime. He developed hives and a generalized rash and was seen in  the ER on 3/15 by Dr. Cain Sieve recommendation he was changed to meropenem however he also apparently developed hives after this. He says the original hives stopped after the cefepime was stopped and follow- up hives only developed after the meropenem. He is only taken 1 dose of the fear of the meropenem and now is on vancomycin. 3/25; the patient was seen by Dr. Linus Salmons on 3/22. Because of intolerances to meropenem which I communicated with Dr. De Burrs about he is now on daptomycin and Cipro until sometime in early April. He seems to be tolerating the antibiotics well. We are using Santyl back by wet-to-dry dressings. And making some improvement in the general surface of this very large wound 4/8; we are using Santyl back by wet-to-dry. We are making progress in terms of prepping the wound for a wound VAC. Still necrotic material requiring debridement. Electronic Signature(s) Signed: 08/19/2019 5:58:56 PM By: [name] MD Entered By: [name] on 08/19/2019 15:16:12 -------------------------------------------------------------------------------- Physical Exam Details Patient Name: Date of Service: Ivan [name] 08/19/2019 2:30 PM Medical Record [id number] Patient Account Number: [id number] Date of Birth/Sex: Treating RN: Mar 15, 1946 (74 y.o. Ivan [name] Primary Care Provider: [name] Other Clinician: Referring Provider: Treating Provider/Extender:[name], [name], Carina Weeks in Treatment: 3 Notes Wound exam; the area questions over the left greater trochanter. This goes to bone after removing necrotic material which is probably muscle with first a #5 curette and then a #10 scalpel and pickups. On the optimistic side we are making progress in prepping this wound with necrotic material being removed and  replaced by a healthier erythematous tissue. Electronic Signature(s) Signed: 08/19/2019 5:58:56 PM By: [name] MD Entered By: [name] on 08/19/2019 15:17:01 -------------------------------------------------------------------------------- Physician Orders Details Patient Name: Date of Service: Ivan [name] 08/19/2019 2:30 PM Medical Record [id number] Patient Account Number: [id number] Date of Birth/Sex: Treating RN: June 10, 1945 (74 y.o. Ivan [name] Primary Care Provider: [name] Other Clinician: Referring Provider: Treating Provider/Extender:Hedy Garro, [name], Carina Weeks in Treatment: 3 Verbal / Phone Orders: No Diagnosis Coding ICD-10 Coding Code Description [phone] Pressure ulcer of left hip, stage 4 Disruption of external operation (surgical) wound, not elsewhere classified, subsequent T81.31XD encounter Follow-up Appointments Return Appointment in 1 week. - Thursday Dressing Change Frequency Change dressing every day. Skin Barriers/Peri-Wound Care Skin Prep Wound Cleansing Clean wound with Wound Cleanser Primary Wound Dressing Wound #1 Left Trochanter Santyl Ointment - backed with saline moisten gauze. Secondary Dressing Foam Border - or bordered gauze. Off-Loading Other: - stay off of left hip to aid in offloading pressure. Patient Medications Allergies: Iodinated Contrast Media, cefepime, meropenem Notifications Medication Indication Start End Santyl 08/19/2019 DOSE topical 250 unit/gram ointment - ointment topical to wound change daily Electronic Signature(s) Signed: 08/19/2019 3:19:04 PM By: [name] MD Entered By: [name] on 08/19/2019 15:19:03 -------------------------------------------------------------------------------- Problem List Details Patient Name: Date of Service: Ivan [name]. 08/19/2019 2:30 PM Medical Record [id number] Patient Account Number: [id number] Date of Birth/Sex: Treating  RN: 1945-12-07 (74 y.o. Ivan [name] Primary Care Provider: [name] Other Clinician: Referring Provider: Treating Provider/Extender:Najmah Carradine, [name], [name] in Treatment: 3 Active Problems ICD-10 Evaluated Encounter Code Description Active Date Today Diagnosis L89.224 Pressure ulcer of left hip, stage 4 08/05/2019 No Yes T81.31XD Disruption of external operation (surgical) wound, not 07/29/2019 No Yes elsewhere classified, subsequent encounter Inactive Problems Resolved Problems Electronic Signature(s) Signed: 08/19/2019 5:58:56 PM By: [name] MD Entered By: [name] on 08/19/2019 15:15:06 -------------------------------------------------------------------------------- Progress Note Details Patient Name: Date  of Service: Ivan [name], Ivan [name] 08/19/2019 2:30 PM Medical Record [id number] Patient Account Number: [id number] Date of Birth/Sex: Treating RN: [birth year] (74 y.o. 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 He required admission to hospital from 1/5 through 1/9 with Covid pneumonia. At that point he apparently left AMA. He was noted to have a left hip bruise at the time of discharge. I suspect this really was a deep tissue injury. He was readmitted from 1/13 through 1//27 with altered mental status Covid pneumonia and delirium. At that point he had a left hip and thigh wound which had worsened. Felt to be a pressure injury. He was seen by general surgery who recommended normal saline wet-to-dry twice daily he did not have a debridement. After he left at that point he was incapacitated by increasing lumbar pain. An MRI on 2/9 showed discitis and osteomyelitis at  L1/2. He asked aspiration of 1 cm fluid however I think this culture negative. He was also noted at this point to have a very large wound over the left greater trochanter. He was taken to the OR by Dr. Marla Roe for debridement with ACell placement. He was placed in a wound VAC. A culture at that point showed Enterococcus and Enterobacter. An MRI on 3/15 showed no evidence of left hip septic arthritis or osteomyelitis. Extensive subcutaneous edema within the visualized soft tissue of the left thigh and prominent left inguinal lymph nodes likely reactive. He was started on IV cefepime and vancomycin for 6 weeks. He was discharged to [facility] facility as the patient. There I gather the wound developed the necrotic surface. He was seen by the wound care doctor in house who correctly pointed out that a wound VAC would not do anything with a necrotic surface. Since then he has been using Santyl The patient is now at home. He was on vancomycin and cefepime. He developed hives and a generalized rash and was seen in the ER on 3/15 by Dr. Cain Sieve recommendation he was changed to meropenem however he also apparently developed hives after this. He says the original hives stopped after the cefepime was stopped and follow- up hives only developed after the meropenem. He is only taken 1 dose of the fear of the meropenem and now is on vancomycin. 3/25; the patient was seen by Dr. Linus Salmons on 3/22. Because of intolerances to meropenem which I communicated with Dr. De Burrs about he is now on daptomycin and Cipro until sometime in early April. He seems to be tolerating the antibiotics well. We are using Santyl back by wet-to-dry dressings. And making some improvement in the general surface of this very large wound 4/8; we are using Santyl back by wet-to-dry. We are making progress in terms of prepping the wound for a wound VAC. Still necrotic material requiring debridement. Objective Constitutional Vitals Time  Taken: 2:40 PM, Height: 70 in, Weight: 198 lbs, BMI: 28.4, Temperature: 98.2 F, Pulse: 87 bpm, Respiratory Rate: 19 breaths/min, Blood Pressure: 137/69 mmHg. Integumentary (Hair, Skin) Wound #1 status is Open. Original cause of wound was Surgical Injury. The wound is located on the Left Trochanter. The wound measures 4.7cm length x 4.5cm width x 3.6cm depth; 16.611cm^2 area and 59.8cm^3 volume. There is muscle and Fat Layer (Subcutaneous Tissue) Exposed exposed. There is no tunneling noted,  however, there is undermining starting at 9:00 and ending at 12:00 with a maximum distance of 4.5cm. There is a large amount of serous drainage noted. The wound margin is well defined and not attached to the wound base. There is small (1-33%) pink granulation within the wound bed. There is a large (67-100%) amount of necrotic tissue within the wound bed including Adherent Slough. Assessment Active Problems ICD-10 Pressure ulcer of left hip, stage 4 Disruption of external operation (surgical) wound, not elsewhere classified, subsequent encounter Procedures Wound #1 Pre-procedure diagnosis of Wound #1 is a Pressure Ulcer located on the Left Trochanter . There was a Excisional Skin/Subcutaneous Tissue/Muscle Debridement with a total area of 14.1 sq cm performed by [name]., MD. With the following instrument(s): Blade, and Curette to remove Viable and Non-Viable tissue/material. Material removed includes Muscle, Subcutaneous Tissue, Slough, Skin: Dermis, and Fibrin/Exudate after achieving pain control using Lidocaine 4% Topical Solution. A time out was conducted at 15:05, prior to the start of the procedure. A Minimum amount of bleeding was controlled with Pressure. The procedure was tolerated well with a pain level of 0 throughout and a pain level of 0 following the procedure. Post Debridement Measurements: 4.7cm length x 4.5cm width x 3.6cm depth; 59.8cm^3 volume. Post debridement Stage noted as  Category/Stage IV. Character of Wound/Ulcer Post Debridement requires further debridement. Post procedure Diagnosis Wound #1: Same as Pre-Procedure Plan Follow-up Appointments: Return Appointment in 1 week. - Thursday Dressing Change Frequency: Change dressing every day. Skin Barriers/Peri-Wound Care: Skin Prep Wound Cleansing: Clean wound with Wound Cleanser Primary Wound Dressing: Wound #1 Left Trochanter: Santyl Ointment - backed with saline moisten gauze. Secondary Dressing: Foam Border - or bordered gauze. Off-Loading: Other: - stay off of left hip to aid in offloading pressure. The following medication(s) was prescribed: Santyl topical 250 unit/gram ointment ointment topical to wound change daily starting 08/19/2019 1. I am continuing with Santyl ointment back with saline moistened gauze 2. Border foam cover 3. Trying to prep this wound for a wound VAC probably in about 3 weeks. We are removing necrotic material. Electronic Signature(s) Signed: 08/19/2019 3:19:30 PM By: [name] MD Entered By: [name] on 08/19/2019 15:19:29 -------------------------------------------------------------------------------- SuperBill Details Patient Name: Date of Service: Ivan [name] 08/19/2019 Medical Record [id number] Patient Account Number: [id number] Date of Birth/Sex: Treating RN: [birth year] (74 y.o. Ivan [name] Primary Care Provider: [name] Other Clinician: Referring Provider: Treating Provider/Extender:Mickie Badders, [name], Carina Weeks in Treatment: 3 Diagnosis Coding ICD-10 Codes Code Description [phone] Pressure ulcer of left hip, stage 4 Disruption of external operation (surgical) wound, not elsewhere classified, subsequent T81.31XD encounter Facility Procedures The patient participates with Medicare or their insurance follows the Medicare Facility Guidelines: CPT4 Code Description Modifier Quantity [id number] - DEB MUSC/FASCIA 20 SQ  CM/< 1 ICD-10 Diagnosis Description L89.224 Pressure ulcer of left hip,  stage 4 Physician Procedures CPT4 Code Description: [id number] - WC PHYS DEBR MUSCLE/FASCIA 20 SQ CM ICD-10 Diagnosis Description L89.224 Pressure ulcer of left hip, stage 4 Modifier: Quantity: 1 Electronic Signature(s) Signed: 08/19/2019 5:58:56 PM By: [name] MD Signed: 08/19/2019 6:08:37 PM By: [name] Entered By: [name] on 08/19/2019 15:14:02

## 2019-08-20 ENCOUNTER — Encounter: Payer: Self-pay | Admitting: Family Medicine

## 2019-08-20 ENCOUNTER — Telehealth: Payer: Self-pay

## 2019-08-20 ENCOUNTER — Other Ambulatory Visit: Payer: Self-pay

## 2019-08-20 ENCOUNTER — Ambulatory Visit (HOSPITAL_COMMUNITY)
Admission: RE | Admit: 2019-08-20 | Discharge: 2019-08-20 | Disposition: A | Discharge status: Home / Self Care | Payer: Medicare HMO | Source: Ambulatory Visit | Attending: Family Medicine | Admitting: Family Medicine

## 2019-08-20 ENCOUNTER — Ambulatory Visit (INDEPENDENT_AMBULATORY_CARE_PROVIDER_SITE_OTHER): Payer: Medicare HMO | Admitting: Family Medicine

## 2019-08-20 VITALS — BP 140/85 | HR 101 | Ht 70.0 in | Wt 214.0 lb

## 2019-08-20 DIAGNOSIS — I2510 Atherosclerotic heart disease of native coronary artery without angina pectoris: Secondary | ICD-10-CM

## 2019-08-20 DIAGNOSIS — I10 Essential (primary) hypertension: Secondary | ICD-10-CM

## 2019-08-20 DIAGNOSIS — I714 Abdominal aortic aneurysm, without rupture: Secondary | ICD-10-CM

## 2019-08-20 DIAGNOSIS — Z951 Presence of aortocoronary bypass graft: Secondary | ICD-10-CM

## 2019-08-20 DIAGNOSIS — R600 Localized edema: Secondary | ICD-10-CM | POA: Insufficient documentation

## 2019-08-20 DIAGNOSIS — I7143 Infrarenal abdominal aortic aneurysm, without rupture: Secondary | ICD-10-CM

## 2019-08-20 DIAGNOSIS — Z23 Encounter for immunization: Secondary | ICD-10-CM

## 2019-08-20 MED ORDER — TETANUS-DIPHTH-ACELL PERTUSSIS 5-2.5-18.5 LF-MCG/0.5 IM SUSP: 0.5000 mL | Freq: Once | INTRAMUSCULAR | 0 refills | Status: AC

## 2019-08-20 MED ORDER — AMLODIPINE BESYLATE 5 MG PO TABS: 10.0000 mg | ORAL_TABLET | Freq: Every day | ORAL | 3 refills | Status: DC

## 2019-08-20 NOTE — Progress Notes (Signed)
    SUBJECTIVE:   CHIEF COMPLAINT / HPI: bilateral leg edema  Ivan [name] is a pleasant 74 year old man with history of recently diagnosis of left sided psoas abscess, osteomyelitis of the vertebral spine (currently on daptomycin + ciprofloxacin), CAD s/p CABG, and remote history of melanoma presenting with left lower extremity edema.   He reports a 2 week history of lower extremity edema. He is uncertain of cause. His only recent medication change is antibiotics--he has had multiple antibiotic changes, now on daptomycin and ciprofloxacin. He reports no improvement with change in position.  He was recently started on amlodipine during a hospital stay, but has been on this for several months. Denies chest pain, palpitations, dyspnea, orthopnea, PND. No prior history of VTE. Family history largely unknown   Last echocardiogram below--normal EF  - Left ventricle: The cavity size was normal. Wall thickness was  increased in a pattern of moderate LVH. Systolic function was  normal. The estimated ejection fraction was in the range of 60%  to 65%. Moderate hypokinesis of the apicalanteroseptal  myocardium.  - Left atrium: The atrium was mildly dilated.  - Right atrium: The atrium was mildly dilated.   Left Hip/Vertebral Osteomyelitis Patients reports pain is okay. Taking tramadol and oxycodone (recently refilled tramadol). Also takes naproxen once a day at most. No fevers. PICC line working well. He is tolerating ABX. Reports copay for daptomycin is $200.   PERTINENT  PMH / PSH/Family/Social History :  Reviewed and updated    OBJECTIVE:   BP 140/85   Pulse (!) 101   Ht 5\' 10"  (1.778 m)   Wt 214 lb (97.1 kg)   SpO2 96%   BMI 30.71 kg/m   HEENT: Sclera anicteric. Dentition is moderate. Appears well hydrated. Neck: Supple Cardiac: Regular rate and rhythm. Normal S1/S2. No murmurs, rubs, or gallops appreciated. Lungs: Clear bilaterally to ascultation.  Extremities: Bilateral  lower extremity edema--left greater than right, greater than 3 cm difference Edema extends to feet of left leg  Skin: Warm, dry, thickened nails, no warmth to left leg, mild erythema to bilateral LE  Psych: Pleasant and appropriate   ASSESSMENT/PLAN:   CAD (coronary artery disease) Has not been seen by Cardiology in several years-referred back to Dr. Acie Fredrickson.  Consider echocardiogram for evaluation of LE prior to appointment pending dopplers   Essential hypertension Slightly above goal today---has been out of amlodipine. Given LE edema, will restart at 5 mg amlodipine.  Previously on nebivolol--which he liked, given CAD, will consider restarting   Lower extremity edema Concern for VTE given recent hospital stay, decreased mobility, >3 cm difference, Wells score of 4.  Duplex ordered Differential includes medication (norvasc), HF (less likely, no other symptoms, BNP ordered) He has had weight gain after considerable weight loss in hospital Venous stasis may also be contributing   HCM  Tdap printed Colonoscopy after left hip heals  Referred back to Dr. Acie Fredrickson  Consider Dermatology referral in future   [name], MD  [facility]

## 2019-08-20 NOTE — Telephone Encounter (Signed)
Ivan [name], from vascular lab, calls nurse line to give report. Negative for DVT in both legs.

## 2019-08-20 NOTE — Patient Instructions (Signed)
I will call you with results of your ultrasound  Reduce your amlodipine to 5 mg daily  It was wonderful to see you today.  Please bring ALL of your medications with you to every visit.   Thank you for choosing [facility].   Please call [phone] with any questions about today's appointment.  Please be sure to schedule follow up at the front  desk before you leave today.   [name], MD  Family Medicine   Schedule follow up at the front in 4 weeks

## 2019-08-20 NOTE — Assessment & Plan Note (Signed)
Concern for VTE given recent hospital stay, decreased mobility, >3 cm difference, Wells score of 4.  Differential includes medication (norvasc), HF (less likely, no other symptoms, BNP ordered) He has had weight gain after considerable weight loss in hospital Venous stasis may also be contributing

## 2019-08-20 NOTE — Assessment & Plan Note (Signed)
Has not been seen by Cardiology in several years-referred back to Dr. Acie Fredrickson.  Consider echocardiogram for evaluation of LE prior to appointment pending dopplers

## 2019-08-20 NOTE — Progress Notes (Signed)
Ivan [name], Ivan [name] ([id number]) Visit Report for 08/19/2019 Arrival Information Details Patient Name: Date of Service: Ivan [name], Ivan [name] 08/19/2019 2:30 PM Medical Record [id number] Patient Account Number: [id number] Date of Birth/Sex: Treating RN: March 09, 1946 (74 y.o. [name] Primary Care Henrique Parekh: [name] Other Clinician: Referring Raidyn Wassink: Treating Kara Melching/Extender:[name], [name], Carina Weeks in Treatment: 3 Visit Information History Since Last Visit Cane Added or deleted any medications: No Patient Arrived: 14:43 Any new allergies or adverse reactions: No Arrival Time: Had a fall or experienced change in No Accompanied By: self None activities of daily living that may affect Transfer Assistance: risk of falls: Patient Identification Verified: Yes Signs or symptoms of abuse/neglect since last No Secondary Verification Process Completed: Yes visito Patient Requires Transmission-Based No Hospitalized since last visit: No Precautions: Implantable device outside of the clinic excluding No Patient Has Alerts: No cellular tissue based products placed in the center since last visit: Has Dressing in Place as Prescribed: Yes Pain Present Now: No Electronic Signature(s) Signed: 08/19/2019 5:43:40 PM By: Kela Millin Entered By: Kela Millin on 08/19/2019 14:43:39 -------------------------------------------------------------------------------- Encounter Discharge Information Details Patient Name: Date of Service: Ivan [name]. 08/19/2019 2:30 PM Medical Record [id number] Patient Account Number: [id number] Date of Birth/Sex: Treating RN: [birth year] (74 y.o. [name] Primary Care Reina Wilton: [name] Other Clinician: Referring Tevan Marian: Treating Crews Mccollam/Extender:[name], [name], [name] in Treatment: 3 Encounter Discharge Information Items Post Procedure Vitals Discharge Condition: Stable Temperature  (F): 98.2 Ambulatory Status: Cane Pulse (bpm): 87 Discharge Destination: Home Respiratory Rate (breaths/min): 18 Transportation: Private Auto Blood Pressure (mmHg): 137/69 Accompanied By: self Schedule Follow-up Appointment: Yes Clinical Summary of Care: Patient Declined Electronic Signature(s) Signed: 08/20/2019 6:10:03 PM By: Baruch Gouty RN, BSN Entered By: Baruch Gouty on 08/19/2019 15:25:20 -------------------------------------------------------------------------------- Lower Extremity Assessment Details Patient Name: Date of Service: Ivan [name]. 08/19/2019 2:30 PM Medical Record [id number] Patient Account Number: [id number] Date of Birth/Sex: Treating RN: 1945/10/26 (73 y.o. [name] Primary Care Donique Hammonds: [name] Other Clinician: Referring Khia Dieterich: Treating Neely Kammerer/Extender:[name], [name], Carina Weeks in Treatment: 3 Electronic Signature(s) Signed: 08/19/2019 5:43:40 PM By: Kela Millin Entered By: Kela Millin on 08/19/2019 14:44:14 -------------------------------------------------------------------------------- Multi Wound Chart Details Patient Name: Date of Service: Ivan [name] 08/19/2019 2:30 PM Medical Record [id number] Patient Account Number: [id number] Date of Birth/Sex: Treating RN: 07-08-1945 (74 y.o. [name] Primary Care Mardee Clune: [name] Other Clinician: Referring Albie Bazin: Treating Gatha Mcnulty/Extender:[name], [name], Carina Weeks in Treatment: 3 Vital Signs Height(in): 70 Pulse(bpm): 101 Weight(lbs): 198 Blood Pressure(mmHg): 137/69 Body Mass Index(BMI): 28 Temperature(F): 98.2 Respiratory 19 Rate(breaths/min): Photos: [1:No Photos] [N/A:N/A] Wound Location: [1:Left Trochanter] [N/A:N/A] Wounding Event: [1:Surgical Injury] [N/A:N/A] Primary Etiology: [1:Pressure Ulcer] [N/A:N/A] Secondary Etiology: [1:Open Surgical Wound] [N/A:N/A N/A] Comorbid History:  [1:Cataracts, Coronary Artery Disease, Hypertension, Myocardial Infarction, Osteoarthritis, Osteomyelitis] [N/A:N/A N/A] Date Acquired: [1:07/01/2019] [N/A:N/A N/A] Weeks of Treatment: [1:3] [N/A:N/A N/A] Wound Status: [1:Open] [N/A:N/A N/A] Measurements L x W x D 4.7x4.5x3.6 [N/A:N/A N/A] (cm) Area (cm) : [1:16.611] [N/A:N/A N/A] Volume (cm) : [1:59.8] [N/A:N/A N/A] % Reduction in Area: [1:23.00%] [N/A:N/A N/A] % Reduction in Volume: -98.10% [N/A:N/A N/A] Starting Position 1 [1:9] (o'clock): Ending Position 1 [1:12] (o'clock): Maximum Distance 1 [1:4.5] (cm): Undermining: [1:Yes] [N/A:N/A N/A] Classification: [1:Category/Stage IV] [N/A:N/A N/A] Exudate Amount: [1:Large] [N/A:N/A N/A] Exudate Type: [1:Serous] [N/A:N/A N/A] Exudate Color: [1:amber] [N/A:N/A N/A] Wound Margin: [1:Well defined, not attached] [N/A:N/A N/A] Granulation Amount: [1:Small (1-33%)] [N/A:N/A N/A] Granulation Quality: [1:Pink] [N/A:N/A N/A] Necrotic Amount: [1:Large (67-100%)] [N/A:N/A N/A] Exposed Structures: [  1:Fat Layer (Subcutaneous Tissue) Exposed: Yes Muscle: Yes Fascia: No Tendon: No Joint: No Bone: No] [N/A:N/A N/A] Epithelialization: [1:None] [N/A:N/A N/A] Debridement: [1:Debridement - Excisional] [N/A:N/A N/A] Pre-procedure [1:15:05] [N/A:N/A N/A] Verification/Time Out Taken: Pain Control: [1:Lidocaine 4% Topical Solution] [N/A:N/A N/A] Tissue Debrided: [1:Muscle, Subcutaneous, Slough] [N/A:N/A N/A] Level: [1:Skin/Subcutaneous Tissue/Muscle] [N/A:N/A N/A] Debridement Area (sq cm):14.1 [N/A:N/A N/A] Instrument: [1:Blade, Curette] [N/A:N/A N/A] Bleeding: [1:Minimum] [N/A:N/A N/A] Hemostasis Achieved: [1:Pressure] [N/A:N/A N/A] Procedural Pain: [1:0] [N/A:N/A N/A] Post Procedural Pain: [1:0] [N/A:N/A N/A] Debridement Treatment Procedure was tolerated [N/A:N/A N/A] Response: [1:well] Post Debridement [1:4.7x4.5x3.6] [N/A:N/A N/A] Measurements L x W x D (cm) Post Debridement [1:59.8] [N/A:N/A  N/A] Volume: (cm) Post Debridement Stage: Category/Stage IV [N/A:N/A N/A] Treatment Notes Electronic Signature(s) Signed: 08/19/2019 5:58:56 PM By: [name] MD Signed: 08/19/2019 6:08:37 PM By: [name] Entered By: [name] on 08/19/2019 15:15:12 -------------------------------------------------------------------------------- Multi-Disciplinary Care Plan Details Patient Name: Date of Service: Ivan [name]. 08/19/2019 2:30 PM Medical Record [id number] Patient Account Number: [id number] Date of Birth/Sex: Treating RN: 1945/06/06 (74 y.o. [name] Primary Care Scheryl Sanborn: [name] Other Clinician: Referring Dema Timmons: Treating Soma Bachand/Extender:[name], [name], Carina Weeks in Treatment: 3 Active Inactive Nutrition Nursing Diagnoses: Potential for alteratiion in Nutrition/Potential for imbalanced nutrition Goals: Patient/caregiver agrees to and verbalizes understanding of need to obtain nutritional consultation Date Initiated: 07/29/2019 Target Resolution Date: 09/10/2019 Goal Status: Active Interventions: Provide education on nutrition Treatment Activities: Education provided on Nutrition : 07/29/2019 Patient referred to Primary Care Physician for further nutritional evaluation : 07/29/2019 Notes: Pain, Acute or Chronic Nursing Diagnoses: Pain, acute or chronic: actual or potential Potential alteration in comfort, pain Goals: Patient will verbalize adequate pain control and receive pain control interventions during procedures as needed Date Initiated: 07/29/2019 Target Resolution Date: 09/10/2019 Goal Status: Active Patient/caregiver will verbalize comfort level met Date Initiated: 07/29/2019 Date Inactivated: 08/19/2019 Target Resolution Date: 08/20/2019 Goal Status: Met Interventions: Encourage patient to take pain medications as prescribed Provide education on pain management Reposition patient for comfort Treatment  Activities: Administer pain control measures as ordered : 07/29/2019 Notes: Electronic Signature(s) Signed: 08/19/2019 6:08:37 PM By: [name] Entered By: [name] on 08/19/2019 14:44:35 -------------------------------------------------------------------------------- Pain Assessment Details Patient Name: Date of Service: Ivan [name], Ivan [name] 08/19/2019 2:30 PM Medical Record [id number] Patient Account Number: [id number] Date of Birth/Sex: Treating RN: 02-13-1946 (74 y.o. [name] Primary Care Micael Barb: [name] Other Clinician: Referring [place]: Treating Jayme Cham/Extender:[name], [name], Carina Weeks in Treatment: 3 Active Problems Location of Pain Severity and Description of Pain Patient Has Paino No Site Locations Pain Management and Medication Current Pain Management: Electronic Signature(s) Signed: 08/19/2019 5:43:40 PM By: Kela Millin Entered By: Kela Millin on 08/19/2019 14:44:08 -------------------------------------------------------------------------------- Patient/Caregiver Education Details Patient Name: Date of Service: Ivan [name] 4/8/2021andnbsp2:30 PM Medical Record [id number] Patient Account Number: [id number] Date of Birth/Gender: Treating RN: 07/22/1945 (73 y.o. [name] Primary Care Physician: [name] Other Clinician: Referring Physician: Treating Physician/Extender:[name], [name], [name] in Treatment: 3 Education Assessment Education Provided To: Patient Education Topics Provided Nutrition: Handouts: Nutrition Methods: Explain/Verbal Responses: Reinforcements needed Electronic Signature(s) Signed: 08/19/2019 6:08:37 PM By: [name] Entered By: [name] on 08/19/2019 14:44:46 -------------------------------------------------------------------------------- Wound Assessment Details Patient Name: Date of Service: Ivan [name], Ivan [name] 08/19/2019 2:30 PM Medical  Record [id number] Patient Account Number: [id number] Date of Birth/Sex: Treating RN: August 18, 1945 (74 y.o. [name] Primary Care Darious Rehman: [name] Other Clinician: Referring Maeleigh Buschman: Treating Meshilem Machuca/Extender:[name], [name], Carina Weeks in Treatment: 3 Wound Status Wound Number:  1 Primary Pressure Ulcer Etiology: Wound Location: Left Trochanter Secondary Open Surgical Wound Wounding Event: Surgical Injury Etiology: Date Acquired: 07/01/2019 Wound Open Weeks Of Treatment: 3 Status: Clustered Wound: No Comorbid Cataracts, Coronary Artery Disease, History: Hypertension, Myocardial Infarction, Osteoarthritis, Osteomyelitis Wound Measurements Length: (cm) 4.7 Width: (cm) 4.5 Depth: (cm) 3.6 Area: (cm) 16.611 Volume: (cm) 59.8 % Reduction in Area: 23% % Reduction in Volume: -98.1% Epithelialization: None Tunneling: No Undermining: Yes Starting Position (o'clock): 9 Ending Position (o'clock): 12 Maximum Distance: (cm) 4.5 Wound Description Classification: Category/Stage IV Wound Margin: Well defined, not attached Exudate Amount: Large Exudate Type: Serous Exudate Color: amber Wound Bed Granulation Amount: Small (1-33%) Granulation Quality: Pink Necrotic Amount: Large (67-100%) Necrotic Quality: Adherent Slough Foul Odor After Cleansing: No Slough/Fibrino Yes Exposed Structure Fascia Exposed: No Fat Layer (Subcutaneous Tissue) Exposed: Yes Tendon Exposed: No Muscle Exposed: Yes Necrosis of Muscle: No Joint Exposed: No Bone Exposed: No Treatment Notes Wound #1 (Left Trochanter) 2. Periwound Care Skin Prep 3. Primary Dressing Applied Santyl 4. Secondary Dressing Foam Border Dressing Notes saline moistened gauze packing Electronic Signature(s) Signed: 08/19/2019 5:43:40 PM By: Kela Millin Entered By: Kela Millin on 08/19/2019  14:52:48 -------------------------------------------------------------------------------- Vitals Details Patient Name: Date of Service: Ivan [name]. 08/19/2019 2:30 PM Medical Record [id number] Patient Account Number: [id number] Date of Birth/Sex: Treating RN: [birth year] (74 y.o. [name] Primary Care Kaizen Ibsen: [name] Other Clinician: Referring Brainard Highfill: Treating Alanni Vader/Extender:[name], [name], Carina Weeks in Treatment: 3 Vital Signs Time Taken: 14:40 Temperature (F): 98.2 Height (in): 70 Pulse (bpm): 87 Weight (lbs): 198 Respiratory Rate (breaths/min): 19 Body Mass Index (BMI): 28.4 Blood Pressure (mmHg): 137/69 Reference Range: 80 - 120 mg / dl Electronic Signature(s) Signed: 08/19/2019 5:43:40 PM By: Kela Millin Entered By: Kela Millin on 08/19/2019 14:44:01

## 2019-08-20 NOTE — Telephone Encounter (Signed)
Contacted patient as I do not recall this form. Patient stated the "appropriate people" have it, as it was faxed. Patient did stated he would like a copy. Do we know where this is?

## 2019-08-20 NOTE — Assessment & Plan Note (Signed)
Slightly above goal today---has been out of amlodipine. Given LE edema, will restart at 5 mg amlodipine.  Previously on nebivolol--which he liked, given CAD, will consider restarting

## 2019-08-20 NOTE — Telephone Encounter (Signed)
Discussed during visit

## 2019-08-21 LAB — BASIC METABOLIC PANEL
BUN/Creatinine Ratio: 32 — ABNORMAL HIGH (ref 10–24)
BUN: 23 mg/dL (ref 8–27)
CO2: 20 mmol/L (ref 20–29)
Calcium: 9.3 mg/dL (ref 8.6–10.2)
Chloride: 102 mmol/L (ref 96–106)
Creatinine, Ser: 0.71 mg/dL — ABNORMAL LOW (ref 0.76–1.27)
GFR calc Af Amer: 109 mL/min/{1.73_m2} (ref >59)
GFR calc non Af Amer: 94 mL/min/{1.73_m2} (ref >59)
Glucose: 105 mg/dL — ABNORMAL HIGH (ref 65–99)
Potassium: 4.2 mmol/L (ref 3.5–5.2)
Sodium: 136 mmol/L (ref 134–144)

## 2019-08-21 LAB — CBC
Hematocrit: 37.3 % — ABNORMAL LOW (ref 37.5–51.0)
Hemoglobin: 12.5 g/dL — ABNORMAL LOW (ref 13.0–17.7)
MCH: 31 pg (ref 26.6–33.0)
MCHC: 33.5 g/dL (ref 31.5–35.7)
MCV: 93 fL (ref 79–97)
Platelets: 332 10*3/uL (ref 150–450)
RBC: 4.03 x10E6/uL — ABNORMAL LOW (ref 4.14–5.80)
RDW: 14.4 % (ref 11.6–15.4)
WBC: 12.3 10*3/uL — ABNORMAL HIGH (ref 3.4–10.8)

## 2019-08-21 LAB — BRAIN NATRIURETIC PEPTIDE: BNP: 56.4 pg/mL (ref 0.0–100.0)

## 2019-08-23 ENCOUNTER — Telehealth: Payer: Self-pay | Admitting: Family Medicine

## 2019-08-23 ENCOUNTER — Other Ambulatory Visit: Payer: Self-pay

## 2019-08-23 ENCOUNTER — Ambulatory Visit (HOSPITAL_COMMUNITY)
Admission: RE | Admit: 2019-08-23 | Discharge: 2019-08-23 | Disposition: A | Discharge status: Home / Self Care | Payer: Medicare HMO | Source: Ambulatory Visit | Attending: Infectious Disease | Admitting: Infectious Disease

## 2019-08-23 ENCOUNTER — Ambulatory Visit: Payer: Medicare HMO

## 2019-08-23 DIAGNOSIS — M869 Osteomyelitis, unspecified: Secondary | ICD-10-CM | POA: Diagnosis not present

## 2019-08-23 LAB — CBC WITH DIFFERENTIAL/PLATELET
Abs Immature Granulocytes: 0.05 10*3/uL (ref 0.00–0.07)
Basophils Absolute: 0 10*3/uL (ref 0.0–0.1)
Basophils Relative: 0 %
Eosinophils Absolute: 0.9 10*3/uL — ABNORMAL HIGH (ref 0.0–0.5)
Eosinophils Relative: 9 %
HCT: 36.6 % — ABNORMAL LOW (ref 39.0–52.0)
Hemoglobin: 11.7 g/dL — ABNORMAL LOW (ref 13.0–17.0)
Immature Granulocytes: 1 %
Lymphocytes Relative: 8 %
Lymphs Abs: 0.8 10*3/uL (ref 0.7–4.0)
MCH: 30.2 pg (ref 26.0–34.0)
MCHC: 32 g/dL (ref 30.0–36.0)
MCV: 94.6 fL (ref 80.0–100.0)
Monocytes Absolute: 1.2 10*3/uL — ABNORMAL HIGH (ref 0.1–1.0)
Monocytes Relative: 11 %
Neutro Abs: 7.9 10*3/uL — ABNORMAL HIGH (ref 1.7–7.7)
Neutrophils Relative %: 71 %
Platelets: 365 10*3/uL (ref 150–400)
RBC: 3.87 MIL/uL — ABNORMAL LOW (ref 4.22–5.81)
RDW: 15.4 % (ref 11.5–15.5)
WBC: 11 10*3/uL — ABNORMAL HIGH (ref 4.0–10.5)
nRBC: 0 % (ref 0.0–0.2)

## 2019-08-23 LAB — CK: Total CK: 47 U/L — ABNORMAL LOW (ref 49–397)

## 2019-08-23 LAB — SEDIMENTATION RATE: Sed Rate: 75 mm/hr — ABNORMAL HIGH (ref 0–16)

## 2019-08-23 LAB — BASIC METABOLIC PANEL
Anion gap: 10 (ref 5–15)
BUN: 16 mg/dL (ref 8–23)
CO2: 24 mmol/L (ref 22–32)
Calcium: 8.7 mg/dL — ABNORMAL LOW (ref 8.9–10.3)
Chloride: 102 mmol/L (ref 98–111)
Creatinine, Ser: 0.7 mg/dL (ref 0.61–1.24)
GFR calc Af Amer: >60 mL/min (ref >60)
GFR calc non Af Amer: >60 mL/min (ref >60)
Glucose, Bld: 118 mg/dL — ABNORMAL HIGH (ref 70–99)
Potassium: 4.1 mmol/L (ref 3.5–5.1)
Sodium: 136 mmol/L (ref 135–145)

## 2019-08-23 LAB — C-REACTIVE PROTEIN: CRP: 10.8 mg/dL — ABNORMAL HIGH (ref <1.0)

## 2019-08-23 NOTE — Progress Notes (Signed)
Patient is here for PICC removal .  Per Dr Tommy Medal  41 cm  Single lumen Peripherally Inserted Central Catheter  removed from right basilic . No sutures present. Dressing was clean and dry . Area cleansed with chlorhexidine and petroleum dressing applied. Pt advised no heavy lifting with this arm, leave dressing for 24 hours and call the office if dressing becomes soaked with blood or sharp pain presents.  Pt tolerated procedure well.    Laverle Patter, RN IV

## 2019-08-23 NOTE — Telephone Encounter (Signed)
Called with results. CBC reviewed, PICC pulled today, on doxy + cipro,. Swelling has improved on lower dose amlodipine.  Recommend monitoring.  He is to call back if this worsens.

## 2019-08-24 ENCOUNTER — Telehealth: Payer: Self-pay

## 2019-08-24 ENCOUNTER — Ambulatory Visit: Payer: Medicare HMO

## 2019-08-24 NOTE — Telephone Encounter (Signed)
Received call today from [facility] requesting lab results from yesterday. Will fax labs as requested P: [place], [place]

## 2019-08-25 NOTE — Progress Notes (Signed)
EDSEL, SHIVES ([id number]) Visit Report for 08/05/2019 Arrival Information Details Patient Name: Date of Service: Ivan [name], Ivan [name] 08/05/2019 2:30 PM Medical Record [id number] Patient Account Number: [id number] Date of Birth/Sex: Treating RN: Nov 27, 1945 (74 y.o. [name] Primary Care Winferd Wease: [name] Other Clinician: Referring Tametha Banning: Treating Tyrelle Raczka/Extender:[name], [name], [name] in Treatment: 1 Visit Information History Since Last Visit Walker Added or deleted any medications: No Patient Arrived: Any new allergies or adverse reactions: No Arrival Time: 14:58 Had a fall or experienced change in No Accompanied By: self activities of daily living that may affect Transfer Assistance: None risk of falls: Patient Identification Verified: Yes Signs or symptoms of abuse/neglect since last No Secondary Verification Process Completed: Yes visito Patient Requires Transmission-Based No Hospitalized since last visit: No Precautions: Implantable device outside of the clinic excluding No Patient Has Alerts: No cellular tissue based products placed in the center since last visit: Has Dressing in Place as Prescribed: Yes Pain Present Now: Yes Electronic Signature(s) Signed: 08/25/2019 9:21:08 AM By: Sandre Kitty Entered By: Sandre Kitty on 08/05/2019 15:00:38 -------------------------------------------------------------------------------- Encounter Discharge Information Details Patient Name: Date of Service: Ivan [name] 08/05/2019 2:30 PM Medical Record [id number] Patient Account Number: [id number] Date of Birth/Sex: Treating RN: Apr 19, 1946 (74 y.o. [name] Primary Care Elige Shouse: [name] Other Clinician: Referring Anistyn Graddy: Treating Lorianna Spadaccini/Extender:[name], [name], Carina Weeks in Treatment: 1 Encounter Discharge Information Items Post Procedure Vitals Discharge Condition: Stable Temperature  (F): 98.1 Ambulatory Status: Walker Pulse (bpm): 84 Discharge Destination: Home Respiratory Rate (breaths/min): 18 Transportation: Private Auto Blood Pressure (mmHg): 133/66 Accompanied By: self Schedule Follow-up Appointment: Yes Clinical Summary of Care: Patient Declined Electronic Signature(s) Signed: 08/05/2019 5:37:17 PM By: Baruch Gouty RN, BSN Entered By: Baruch Gouty on 08/05/2019 15:56:49 -------------------------------------------------------------------------------- Lower Extremity Assessment Details Patient Name: Date of Service: Ivan [name]. 08/05/2019 2:30 PM Medical Record [id number] Patient Account Number: [id number] Date of Birth/Sex: Treating RN: 11-29-1945 (73 y.o. [name] Primary Care Connar Keating: [name] Other Clinician: Referring Shakoya Gilmore: Treating Cayci Mcnabb/Extender:[name], [name], Carina Weeks in Treatment: 1 Electronic Signature(s) Signed: 08/05/2019 6:22:19 PM By: [name] Signed: 08/25/2019 9:21:08 AM By: Sandre Kitty Entered By: Sandre Kitty on 08/05/2019 15:03:01 -------------------------------------------------------------------------------- Multi Wound Chart Details Patient Name: Date of Service: Ivan [name] 08/05/2019 2:30 PM Medical Record [id number] Patient Account Number: [id number] Date of Birth/Sex: Treating RN: 1945-07-01 (74 y.o. [name] Primary Care Labrandon Knoch: [name] Other Clinician: Referring Welma Mccombs: Treating Brad Lieurance/Extender:[name], [name], Carina Weeks in Treatment: 1 Vital Signs Height(in): 70 Pulse(bpm): 48 Weight(lbs): 198 Blood Pressure(mmHg): 133/66 Body Mass Index(BMI): 28 Temperature(F): 98.1 Respiratory 20 Rate(breaths/min): Photos: [1:No Photos] [N/A:N/A] Wound Location: [1:Left Trochanter] [N/A:N/A] Wounding Event: [1:Surgical Injury] [N/A:N/A] Primary Etiology: [1:Pressure Ulcer] [N/A:N/A N/A] Secondary Etiology: [1:Open Surgical  Wound] [N/A:N/A N/A] Comorbid History: [1:Cataracts, Coronary Artery N/A Disease, Hypertension, Myocardial Infarction, Osteoarthritis, Osteomyelitis] [N/A:N/A] Date Acquired: [1:07/01/2019] [N/A:N/A N/A] Weeks of Treatment: [1:1] [N/A:N/A N/A] Wound Status: [1:Open] [N/A:N/A N/A] Measurements L x W x D 6x4.2x2 [N/A:N/A N/A] (cm) Area (cm) : [1:19.792] [N/A:N/A N/A] Volume (cm) : [1:39.584] [N/A:N/A N/A] % Reduction in Area: [1:8.20%] [N/A:N/A N/A] % Reduction in Volume: -31.10% [N/A:N/A N/A] Classification: [1:Category/Stage IV] [N/A:N/A N/A] Exudate Amount: [1:Large] [N/A:N/A N/A] Exudate Type: [1:Purulent] [N/A:N/A N/A] Exudate Color: [1:yellow, brown, green] [N/A:N/A N/A] Wound Margin: [1:Well defined, not attached N/A] [N/A:N/A] Granulation Amount: [1:Small (1-33%)] [N/A:N/A N/A] Granulation Quality: [1:Pink] [N/A:N/A N/A] Necrotic Amount: [1:Large (67-100%)] [N/A:N/A N/A] Exposed Structures: [1:Fat Layer (Subcutaneous N/A Tissue) Exposed: Yes Muscle: Yes  Fascia: No Tendon: No Joint: No Bone: No] [N/A:N/A] Epithelialization: [1:None] [N/A:N/A N/A] Debridement: [1:Debridement - Excisional N/A] [N/A:N/A] Pre-procedure [1:15:35] [N/A:N/A N/A] Verification/Time Out Taken: Pain Control: [1:Lidocaine 4% Topical Solution] [N/A:N/A N/A] Tissue Debrided: [1:Muscle, Fat, Subcutaneous, N/A Slough] [N/A:N/A] Level: [1:Skin/Subcutaneous Tissue/Muscle] [N/A:N/A N/A] Debridement Area (sq cm):25.2 [N/A:N/A N/A] Instrument: [1:Blade, Curette, Forceps] [N/A:N/A N/A] Bleeding: [1:Minimum] [N/A:N/A N/A] Hemostasis Achieved: [1:Pressure] [N/A:N/A N/A] Procedural Pain: [1:0] [N/A:N/A N/A] Post Procedural Pain: [1:0] [N/A:N/A N/A] Debridement Treatment Procedure was tolerated [N/A:N/A N/A] Response: [1:well] Post Debridement [1:6x4.2x2] [N/A:N/A N/A] Measurements L x W x D (cm) Post Debridement [1:39.584] [N/A:N/A N/A] Volume: (cm) Post Debridement Stage: Category/Stage IV [N/A:N/A N/A N/A  N/A] Treatment Notes Wound #1 (Left Trochanter) 2. Periwound Care Skin Prep 3. Primary Dressing Applied Santyl Other primary dressing (specifiy in notes) 4. Secondary Dressing Foam Border Dressing Notes saline moistened gauze packing Electronic Signature(s) Signed: 08/05/2019 6:03:33 PM By: [name] MD Signed: 08/05/2019 6:22:19 PM By: [name] Entered By: [name] on 08/05/2019 16:39:25 -------------------------------------------------------------------------------- Multi-Disciplinary Care Plan Details Patient Name: Date of Service: Ivan [name]. 08/05/2019 2:30 PM Medical Record [id number] Patient Account Number: [id number] Date of Birth/Sex: Treating RN: [birth year] (74 y.o. [name] Primary Care Ark Agrusa: [name] Other Clinician: Referring Rett Stehlik: Treating Marlia Schewe/Extender:[name], [name], Carina Weeks in Treatment: 1 Active Inactive Nutrition Nursing Diagnoses: Potential for alteratiion in Nutrition/Potential for imbalanced nutrition Goals: Patient/caregiver agrees to and verbalizes understanding of need to obtain nutritional consultation Date Initiated: 07/29/2019 Target Resolution Date: 08/20/2019 Goal Status: Active Interventions: Provide education on nutrition Treatment Activities: Education provided on Nutrition : 07/29/2019 Patient referred to Primary Care Physician for further nutritional evaluation : 07/29/2019 Notes: Pain, Acute or Chronic Nursing Diagnoses: Pain, acute or chronic: actual or potential Potential alteration in comfort, pain Goals: Patient will verbalize adequate pain control and receive pain control interventions during procedures as needed Date Initiated: 07/29/2019 Target Resolution Date: 08/20/2019 Goal Status: Active Patient/caregiver will verbalize comfort level met Date Initiated: 07/29/2019 Target Resolution Date: 08/20/2019 Goal Status: Active Interventions: Encourage patient to take  pain medications as prescribed Provide education on pain management Reposition patient for comfort Treatment Activities: Administer pain control measures as ordered : 07/29/2019 Notes: Wound/Skin Impairment Nursing Diagnoses: Knowledge deficit related to ulceration/compromised skin integrity Goals: Patient/caregiver will verbalize understanding of skin care regimen Date Initiated: 07/29/2019 Target Resolution Date: 08/20/2019 Goal Status: Active Interventions: Assess patient/caregiver ability to obtain necessary supplies Assess patient/caregiver ability to perform ulcer/skin care regimen upon admission and as needed Provide education on ulcer and skin care Treatment Activities: Skin care regimen initiated : 07/29/2019 Topical wound management initiated : 07/29/2019 Notes: Electronic Signature(s) Signed: 08/05/2019 6:22:19 PM By: [name] Entered By: [name] on 08/05/2019 16:48:52 -------------------------------------------------------------------------------- Pain Assessment Details Patient Name: Date of Service: Ivan [name], Ivan [name] 08/05/2019 2:30 PM Medical Record [id number] Patient Account Number: [id number] Date of Birth/Sex: Treating RN: 10/05/1945 (74 y.o. [name] Primary Care Colbert Curenton: [name] Other Clinician: Referring Bekah Igoe: Treating Reshma Hoey/Extender:[name], [name], Carina Weeks in Treatment: 1 Active Problems Location of Pain Severity and Description of Pain Patient Has Paino Yes Site Locations Rate the pain. Current Pain Level: 4 Pain Management and Medication Current Pain Management: Electronic Signature(s) Signed: 08/05/2019 6:22:19 PM By: [name] Signed: 08/25/2019 9:21:08 AM By: Sandre Kitty Entered By: Sandre Kitty on 08/05/2019 15:02:54 -------------------------------------------------------------------------------- Patient/Caregiver Education Details Patient Name: Date of Service: Ivan [name]  3/25/2021andnbsp2:30 PM Medical Record [id number] Patient Account Number: [id number] Date of Birth/Gender: Treating RN: 08-27-1945 (73  y.o. [name] Primary Care Physician: [name] Other Clinician: Referring Physician: Treating Physician/Extender:[name], [name], [name] in Treatment: 1 Education Assessment Education Provided To: Patient Education Topics Provided Pain: Handouts: A Guide to Pain Control Methods: Explain/Verbal Responses: Reinforcements needed Electronic Signature(s) Signed: 08/05/2019 6:22:19 PM By: [name] Entered By: [name] on 08/05/2019 16:49:02 -------------------------------------------------------------------------------- Wound Assessment Details Patient Name: Date of Service: Ivan [name], Ivan [name] 08/05/2019 2:30 PM Medical Record [id number] Patient Account Number: [id number] Date of Birth/Sex: Treating RN: 03-22-1946 (74 y.o. [name] Primary Care Thaddius Manes: [name] Other Clinician: Referring Cleave Ternes: Treating Verdie Barrows/Extender:[name], [name], Carina Weeks in Treatment: 1 Wound Status Wound Number: 1 Primary Pressure Ulcer Etiology: Wound Location: Left Trochanter Secondary Open Surgical Wound Wounding Event: Surgical Injury Etiology: Date Acquired: 07/01/2019 Wound Open Weeks Of Treatment: 1 Status: Clustered Wound: No Comorbid Cataracts, Coronary Artery Disease, History: Hypertension, Myocardial Infarction, Osteoarthritis, Osteomyelitis Photos Photo Uploaded By: Mikeal Hawthorne on 08/06/2019 13:39:16 Wound Measurements Length: (cm) 6 Width: (cm) 4.2 Depth: (cm) 2 Area: (cm) 19.792 Volume: (cm) 39.584 Wound Description Classification: Category/Stage IV Wound Margin: Well defined, not attached Exudate Amount: Large Exudate Type: Purulent Exudate Color: yellow, brown, green Wound Bed Granulation Amount: Small (1-33%) Granulation Quality: Pink Necrotic Amount: Large  (67-100%) Necrotic Quality: Adherent Slough After Cleansing: No rino Yes Exposed Structure osed: No (Subcutaneous Tissue) Exposed: Yes sed: No sed: Yes is of Muscle: No ed: No d: No % Reduction in Area: 8.2% % Reduction in Volume: -31.1% Epithelialization: None Tunneling: No Undermining: No Foul Odor Slough/Fib Fascia Exp Fat Layer Tendon Expo Muscle Expo Necros Joint Expos Bone Expose Electronic Signature(s) Signed: 08/05/2019 6:22:19 PM By: [name] Signed: 08/06/2019 5:43:34 PM By: Carlene Coria RN Entered By: Carlene Coria on 08/05/2019 15:14:58 -------------------------------------------------------------------------------- Vitals Details Patient Name: Date of Service: Ivan [name] 08/05/2019 2:30 PM Medical Record [id number] Patient Account Number: [id number] Date of Birth/Sex: Treating RN: [birth year] (74 y.o. [name] Primary Care Canuto Kingston: [name] Other Clinician: Referring Sherida Dobkins: Treating Cordarrell Sane/Extender:[name], [name], Carina Weeks in Treatment: 1 Vital Signs Time Taken: 15:00 Temperature (F): 98.1 Height (in): 70 Pulse (bpm): 84 Weight (lbs): 198 Respiratory Rate (breaths/min): 20 Body Mass Index (BMI): 28.4 Blood Pressure (mmHg): 133/66 Reference Range: 80 - 120 mg / dl Electronic Signature(s) Signed: 08/25/2019 9:21:08 AM By: Sandre Kitty Entered By: Sandre Kitty on 08/05/2019 15:02:51

## 2019-08-26 ENCOUNTER — Encounter (HOSPITAL_BASED_OUTPATIENT_CLINIC_OR_DEPARTMENT_OTHER): Payer: Medicare HMO | Admitting: Internal Medicine

## 2019-08-26 ENCOUNTER — Ambulatory Visit: Payer: Medicare HMO

## 2019-08-26 ENCOUNTER — Other Ambulatory Visit: Payer: Self-pay

## 2019-08-26 ENCOUNTER — Telehealth: Payer: Self-pay | Admitting: Family Medicine

## 2019-08-26 DIAGNOSIS — R262 Difficulty in walking, not elsewhere classified: Secondary | ICD-10-CM

## 2019-08-26 DIAGNOSIS — M6281 Muscle weakness (generalized): Secondary | ICD-10-CM

## 2019-08-26 DIAGNOSIS — M545 Low back pain: Secondary | ICD-10-CM | POA: Diagnosis not present

## 2019-08-26 DIAGNOSIS — A491 Streptococcal infection, unspecified site: Secondary | ICD-10-CM

## 2019-08-26 DIAGNOSIS — U071 COVID-19: Secondary | ICD-10-CM

## 2019-08-26 DIAGNOSIS — L89203 Pressure ulcer of unspecified hip, stage 3: Secondary | ICD-10-CM

## 2019-08-26 DIAGNOSIS — L89224 Pressure ulcer of left hip, stage 4: Secondary | ICD-10-CM | POA: Diagnosis not present

## 2019-08-26 DIAGNOSIS — G8929 Other chronic pain: Secondary | ICD-10-CM
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
## 2019-08-26 NOTE — Patient Instructions (Signed)
HEP: SKTC, LTR, piriformis stretch, PPT, PPT c knee lifts, scapular retractions c Tband, shoulder extension c Tband

## 2019-08-26 NOTE — Telephone Encounter (Signed)
Patient is calling to check on the status of his updated disability form being completed and faxed to [facility]. He said he has discussed the new dates with Dr. Owens Shark but just wanted to remind her he needs to be out until 10/26/19.  The best call back number with any questions is [phone]

## 2019-08-27 NOTE — Telephone Encounter (Signed)
One of these was previously completed---- I have not received one since our last visit. Please ask patient to have another form faxed. I will happy to complete.  Thank you, [name], MD  [facility] Medicine Teaching Service

## 2019-08-27 NOTE — Telephone Encounter (Signed)
Spoke to patient and he states that he needs the date on the disability form (previous) changed to at least June 2021.  Per conversation patient does not have a new form to be filled out.  Just wants to have  the dates changed.  Patient says that he still has a hole in his hip that is the size of his fist.  He states that he will not be able to return to work.  Please advise.  Ozella Almond, [place]

## 2019-08-29 ENCOUNTER — Other Ambulatory Visit: Payer: Self-pay | Admitting: Nurse Practitioner

## 2019-08-29 DIAGNOSIS — N138 Other obstructive and reflux uropathy: Secondary | ICD-10-CM

## 2019-08-30 NOTE — Telephone Encounter (Signed)
Called patient and left generic voicemail.   If patient calls back, I need a new short term disability form--- completed and sent one previously.    [name], MD  Family Medicine Teaching Service

## 2019-08-31 ENCOUNTER — Ambulatory Visit: Payer: Medicare HMO

## 2019-09-01 ENCOUNTER — Ambulatory Visit: Payer: Medicare HMO | Admitting: Infectious Disease

## 2019-09-02 ENCOUNTER — Telehealth: Payer: Self-pay

## 2019-09-02 NOTE — Telephone Encounter (Signed)
F/U with pt who missed an appt on 08/31/19. Pt reports he over did exercising at home and strained his R quad. Pt reports he needs to rest and would like to DC his current PT appts and then set up future appts when he feels he is ready to return to PT.

## 2019-09-07 ENCOUNTER — Telehealth: Payer: Self-pay | Admitting: Family Medicine

## 2019-09-07 NOTE — Telephone Encounter (Signed)
Reviewed form and placed in PCP's box for completion.  .Gram Siedlecki R Barack Nicodemus, CMA  

## 2019-09-07 NOTE — Telephone Encounter (Signed)
Disability Insurance form dropped off for at front desk for completion.  Verified that patient section of form has been completed.  Last DOS/WCC with PCP was 08-20-2019.  Placed form in RED team folder to be completed by clinical staff.  Richrd Humbles

## 2019-09-08 NOTE — Telephone Encounter (Signed)
Reviewed, completed, and signed form.  Note routed to RN team inbasket and placed completed form in Clinic RN's office (wall pocket). Please print medication list and my note from April. Please include in fax.  Martyn Malay, MD

## 2019-09-08 NOTE — Telephone Encounter (Signed)
Faxed form and required documentation to provided number. Called patient and informed that form had been faxed. Placed copy at front desk for patient to pick up. Copy made for batch scanning.   Talbot Grumbling, RN

## 2019-09-09 ENCOUNTER — Other Ambulatory Visit: Payer: Self-pay

## 2019-09-09 ENCOUNTER — Encounter (HOSPITAL_BASED_OUTPATIENT_CLINIC_OR_DEPARTMENT_OTHER): Payer: Medicare HMO | Admitting: Internal Medicine

## 2019-09-09 DIAGNOSIS — L89224 Pressure ulcer of left hip, stage 4: Secondary | ICD-10-CM | POA: Diagnosis not present

## 2019-09-09 NOTE — Progress Notes (Signed)
**Note Ivan-Identified via Obfuscation** ZACHARI, FAROOQI ([id number]) Visit Report for 09/09/2019 HPI Details Patient Name: Date of Service: Ivan [name]. 09/09/2019 1:30 PM Medical Record Number: [id number] Patient Account Number: [id number] Date of Birth/Sex: Treating RN: 03-22-1946 (74 y.o. 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 He required admission to hospital from 1/5 through 1/9 with Covid pneumonia. At that point he apparently left AMA. He was noted to have a left hip bruise at the time of discharge. I suspect this really was a deep tissue injury. He was readmitted from 1/13 through 1//27 with altered mental status Covid pneumonia and delirium. At that point he had a left hip and thigh wound which had worsened. Felt to be a pressure injury. He was seen by general surgery who recommended normal saline wet-to-dry twice daily he did not have a debridement. After he left at that point he was incapacitated by increasing lumbar pain. An MRI on 2/9 showed discitis and osteomyelitis at L1/2. He asked aspiration of 1 cm fluid however I think this culture negative. He was also noted at this point to have a very large wound over the left greater trochanter. He was taken to the OR by Dr. Marla [name] for debridement with ACell placement. He was placed in a wound VAC. A culture at that point showed Enterococcus and Enterobacter. An MRI on 3/15 showed no evidence of left hip septic arthritis or osteomyelitis. Extensive subcutaneous edema within the visualized soft tissue of the left thigh and prominent left inguinal lymph nodes likely reactive. He was started on IV cefepime and vancomycin for 6 weeks. He was discharged to [facility] facility as the patient.  There I gather the wound developed the necrotic surface. He was seen by the wound care doctor in house who correctly pointed out that a wound VAC would not do anything with a necrotic surface. Since then he has been using Santyl The patient is now at home. He was on vancomycin and cefepime. He developed hives and a generalized rash and was seen in the ER on 3/15 by Dr. Cain [name] recommendation he was changed to meropenem however he also apparently developed hives after this. He says the original hives stopped after the cefepime was stopped and follow-up hives only developed after the meropenem. He is only taken 1 dose of the fear of the meropenem and now is on vancomycin. 3/25; the patient was seen by Dr. Linus [name] on 3/22. Because of intolerances to meropenem which I communicated with Dr. De [name] about he is now on daptomycin and Cipro until sometime in early April. He seems to be tolerating the antibiotics well. We are using Santyl back by wet-to-dry dressings. And making some improvement in the general surface of this very large wound 4/8; we are using Santyl back by wet-to-dry. We are making progress in terms of prepping the wound for a wound VAC. Still necrotic material requiring debridement. 4/16; we are using Santyl back by wet-to-dry. Not convinced that his wife is packing this properly there is a 6 cm tunnel at 12:00. We brought this to attention. Still requiring mechanical debridement 4/29; 2-week follow-up. In general the wound is better however there is considerable tunneling tunneling to the greater trochanter itself. There is still some debris in the deeper parts of this wound I did  not do mechanical debridement on this today. I am going to change the dressing to quarter strength Dakin's wet-to-dry. We will put a wound VAC through his insurance. His wife has had brain damage from a previous meningioma surgery. She is not comfortable around strangers in her home. The patient is concerned about  this Electronic Signature(s) Signed: 09/09/2019 5:41:26 PM By: Ivan Ham MD Entered By: Ivan [name] on 09/09/2019 14:51:34 -------------------------------------------------------------------------------- Physical Exam Details Patient Name: Date of Service: Ivan [name], [name] 09/09/2019 1:30 PM Medical Record Number: [id number] Patient Account Number: [id number] Date of Birth/Sex: Treating RN: 1945/09/24 (74 y.o. Ivan [name] Primary Care Provider: Dorris [name] Other Clinician: Referring Provider: Treating Provider/Extender: Ronnie Derby, [name] in Treatment: 6 Respiratory work of breathing is normal. Cardiovascular Mild pitting edema in the lower extremities. Integumentary (Hair, Skin) No erythema around the wound. Notes Wound exam; the area questions on the left greater trochanter deep tunneling probes precariously close to bone. No gross purulence no obvious soft tissue infection Electronic Signature(s) Signed: 09/09/2019 5:41:26 PM By: Ivan Ham MD Entered By: Ivan [name] on 09/09/2019 14:52:29 -------------------------------------------------------------------------------- Physician Orders Details Patient Name: Date of Service: Ivan [name], [name] 09/09/2019 1:30 PM Medical Record Number: [id number] Patient Account Number: [id number] Date of Birth/Sex: Treating RN: Jul 19, 1945 (74 y.o. Ivan [name] Primary Care Provider: Dorris [name] Other Clinician: Referring Provider: Treating Provider/Extender: Fonnie Jarvis in Treatment: 6 Verbal / Phone Orders: No Diagnosis Coding ICD-10 Coding Code Description [phone] Pressure ulcer of left hip, stage 4 T81.31XD Disruption of external operation (surgical) wound, not elsewhere classified, subsequent encounter Follow-up Appointments ppointment in 2 weeks. - Thursday Return A Dressing Change Frequency Change dressing three times week. - changing dakins daily until  wound vac arrives. Skin Barriers/Peri-Wound Care Skin Prep Wound Cleansing Clean wound with Wound Cleanser Primary Wound Dressing Wound #1 Left Trochanter Other: - 0.25% Dakin's Solution wet to dry use until wound vac arrives. use anascept wet to dry dressing in clinic. Secondary Dressing Foam Border - or bordered gauze. may use extra tape to secure dressing in place as needed. Negative Presssure Wound Therapy Wound #1 Left Trochanter Wound Vac to wound continuously at 156mm/hg pressure Black and White Foam combination - white foam to undermining. Off-Loading Other: - stay off of left hip to aid in offloading pressure. Home Health dmit to [facility] - for wound vac application. Education and teaching patient and wife how to apply and change wound A vac. Electronic Signature(s) Signed: 09/09/2019 5:33:37 PM By: [name] Signed: 09/09/2019 5:41:26 PM By: Ivan Ham MD Entered By: [name] on 09/09/2019 14:49:13 -------------------------------------------------------------------------------- Problem List Details Patient Name: Date of Service: Ivan [name], [name] 09/09/2019 1:30 PM Medical Record Number: [id number] Patient Account Number: [id number] Date of Birth/Sex: Treating RN: 1945-08-26 (74 y.o. Ivan [name] Primary Care Provider: Dorris [name] Other Clinician: Referring Provider: Treating Provider/Extender: Ronnie Derby, [name] in Treatment: 6 Active Problems ICD-10 Encounter Code Description Active Date MDM Diagnosis L89.224 Pressure ulcer of left hip, stage 4 08/05/2019 No Yes T81.31XD Disruption of external operation (surgical) wound, not elsewhere classified, 07/29/2019 No Yes subsequent encounter Inactive Problems Resolved Problems Electronic Signature(s) Signed: 09/09/2019 5:41:26 PM By: Ivan Ham MD Entered By: Ivan [name] on 09/09/2019  14:50:23 -------------------------------------------------------------------------------- Progress Note Details Patient Name: Date of Service: Ivan [name], [name] 09/09/2019 1:30 PM Medical Record Number: [id number] Patient Account Number: [id number] Date of Birth/Sex: Treating RN: [birth year] (73  y.o. 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 He required admission to hospital from 1/5 through 1/9 with Covid pneumonia. At that point he apparently left AMA. He was noted to have a left hip bruise at the time of discharge. I suspect this really was a deep tissue injury. He was readmitted from 1/13 through 1//27 with altered mental status Covid pneumonia and delirium. At that point he had a left hip and thigh wound which had worsened. Felt to be a pressure injury. He was seen by general surgery who recommended normal saline wet-to-dry twice daily he did not have a debridement. After he left at that point he was incapacitated by increasing lumbar pain. An MRI on 2/9 showed discitis and osteomyelitis at L1/2. He asked aspiration of 1 cm fluid however I think this culture negative. He was also noted at this point to have a very large wound over the left greater trochanter. He was taken to the OR by Dr. Marla [name] for debridement with ACell placement. He was placed in a wound VAC. A culture at that point showed Enterococcus and Enterobacter. An MRI on 3/15 showed no evidence of left hip septic arthritis or osteomyelitis. Extensive subcutaneous edema within the visualized soft tissue of the left thigh and prominent left inguinal lymph nodes likely reactive. He was started on IV cefepime and vancomycin for 6 weeks. He was  discharged to [facility] [facility] facility as the patient. There I gather the wound developed the necrotic surface. He was seen by the wound care doctor in house who correctly pointed out that a wound VAC would not do anything with a necrotic surface. Since then he has been using Santyl The patient is now at home. He was on vancomycin and cefepime. He developed hives and a generalized rash and was seen in the ER on 3/15 by Dr. Cain [name] recommendation he was changed to meropenem however he also apparently developed hives after this. He says the original hives stopped after the cefepime was stopped and follow-up hives only developed after the meropenem. He is only taken 1 dose of the fear of the meropenem and now is on vancomycin. 3/25; the patient was seen by Dr. Linus [name] on 3/22. Because of intolerances to meropenem which I communicated with Dr. De [name] about he is now on daptomycin and Cipro until sometime in early April. He seems to be tolerating the antibiotics well. We are using Santyl back by wet-to-dry dressings. And making some improvement in the general surface of this very large wound 4/8; we are using Santyl back by wet-to-dry. We are making progress in terms of prepping the wound for a wound VAC. Still necrotic material requiring debridement. 4/16; we are using Santyl back by wet-to-dry. Not convinced that his wife is packing this properly there is a 6 cm tunnel at 12:00. We brought this to attention. Still requiring mechanical debridement 4/29; 2-week follow-up. In general the wound is better however there is considerable tunneling tunneling to the greater trochanter itself. There is still some debris in the deeper parts of this wound I did not do mechanical debridement on this today. I am going to change the dressing to quarter strength Dakin's wet-to-dry. We will put a wound VAC through his insurance. His wife has had brain damage from a previous meningioma  surgery. She is not comfortable around  strangers in her home. The patient is concerned about this Objective Constitutional Vitals Time Taken: 2:30 PM, Height: 70 in, Weight: 198 lbs, BMI: 28.4, Temperature: 98.3 F, Pulse: 57 bpm, Respiratory Rate: 19 breaths/min, Blood Pressure: 137/51 mmHg. Respiratory work of breathing is normal. Cardiovascular Mild pitting edema in the lower extremities. General Notes: Wound exam; the area questions on the left greater trochanter deep tunneling probes precariously close to bone. No gross purulence no obvious soft tissue infection Integumentary (Hair, Skin) No erythema around the wound. Wound #1 status is Open. Original cause of wound was Surgical Injury. The wound is located on the Left Trochanter. The wound measures 4cm length x 3.5cm width x 3.8cm depth; 10.996cm^2 area and 41.783cm^3 volume. There is muscle and Fat Layer (Subcutaneous Tissue) Exposed exposed. There is no tunneling noted, however, there is undermining starting at 9:00 and ending at 2:00 with a maximum distance of 5.5cm. There is a large amount of serous drainage noted. The wound margin is well defined and not attached to the wound base. There is large (67-100%) pink granulation within the wound bed. There is a small (1-33%) amount of necrotic tissue within the wound bed including Adherent Slough. Assessment Active Problems ICD-10 Pressure ulcer of left hip, stage 4 Disruption of external operation (surgical) wound, not elsewhere classified, subsequent encounter Plan Follow-up Appointments: Return Appointment in 2 weeks. - Thursday Dressing Change Frequency: Change dressing three times week. - changing dakins daily until wound vac arrives. Skin Barriers/Peri-Wound Care: Skin Prep Wound Cleansing: Clean wound with Wound Cleanser Primary Wound Dressing: Wound #1 Left Trochanter: Other: - 0.25% Dakin's Solution wet to dry use until wound vac arrives. use anascept wet to dry dressing in clinic. Secondary  Dressing: Foam Border - or bordered gauze. may use extra tape to secure dressing in place as needed. Negative Presssure Wound Therapy: Wound #1 Left Trochanter: Wound Vac to wound continuously at 122mm/hg pressure Black and White Foam combination - white foam to undermining. Off-Loading: Other: - stay off of left hip to aid in offloading pressure. Home Health: Admit to [facility] for [facility] - for wound vac application. Education and teaching patient and wife how to apply and change wound vac. 1. Quarter strength Dakin's wet-to-dry daily 2. We are putting the wound VAC through his insurance how we sort "through the home health vis--vis the issues with his wife will worry about that next visit in 2 weeks Electronic Signature(s) Signed: 09/09/2019 5:41:26 PM By: Ivan Ham MD Entered By: Ivan [name] on 09/09/2019 14:54:30 -------------------------------------------------------------------------------- SuperBill Details Patient Name: Date of Service: Ivan [name], [name] 09/09/2019 Medical Record Number: [id number] Patient Account Number: [id number] Date of Birth/Sex: Treating RN: 17-Jul-1945 (74 y.o. Ivan [name] Primary Care Provider: Dorris [name] Other Clinician: Referring Provider: Treating Provider/Extender: Ronnie Derby, [name] in Treatment: 6 Diagnosis Coding ICD-10 Codes Code Description [phone] Pressure ulcer of left hip, stage 4 T81.31XD Disruption of external operation (surgical) wound, not elsewhere classified, subsequent encounter Facility Procedures The patient participates with Medicare or their insurance follows the Medicare Facility Guidelines: CPT4 Code Description Modifier Quantity [id number] [facility] VISIT-LEV 4 EST PT 1 Physician Procedures : CPT4 Code Description Modifier [id number] - WC PHYS LEVEL 3 - EST PT ICD-10 Diagnosis Description L89.224 Pressure ulcer of left hip, stage 4 T81.31XD Disruption of external  operation (surgical) wound, not elsewhere classified, subsequent encounter Quantity: 1 Electronic Signature(s) Signed: 09/09/2019 5:33:37 PM By: [name]  Signed: 09/09/2019 5:41:26 PM By: Ivan Ham MD Entered By: [name] on 09/09/2019 17:00:44

## 2019-09-10 ENCOUNTER — Ambulatory Visit (INDEPENDENT_AMBULATORY_CARE_PROVIDER_SITE_OTHER): Payer: Medicare HMO | Admitting: Family Medicine

## 2019-09-10 VITALS — BP 130/64 | HR 58 | Ht 70.0 in | Wt 214.0 lb

## 2019-09-10 DIAGNOSIS — G629 Polyneuropathy, unspecified: Secondary | ICD-10-CM

## 2019-09-10 DIAGNOSIS — R600 Localized edema: Secondary | ICD-10-CM

## 2019-09-10 MED ORDER — GABAPENTIN 300 MG PO CAPS: 300.0000 mg | ORAL_CAPSULE | Freq: Three times a day (TID) | ORAL | 0 refills | Status: DC

## 2019-09-10 NOTE — Progress Notes (Signed)
    SUBJECTIVE:   CHIEF COMPLAINT / HPI:   Leg edema/pain  Patient who has chronic edema presenting with worsening edema over the last 2 weeks.  Of note he was seen by Dr. Owens Shark early April and taken off of amlodipine for concern that it was contributing.  Per the patient it has still continued to get worse since then.  We discussed the chart review shows no significant weight gain and patient does not complain of any at this time.  Says he has had no new shortness of breath or inability to tolerate activity.  Denies any orthopnea or shortness of breath.  Says that the edema does improve when he props his legs up.  He has tried compression stockings in the past but did not find them to be useful.  They were difficult to put in and he has back issues which make the logistics of putting them on difficult  PERTINENT  PMH / PSH: History of leg edema, history of recent spinal infection, history of left hip wound.  OBJECTIVE:   BP 130/64   Pulse (!) 58   Ht 5\' 10"  (1.778 m)   Wt 214 lb (97.1 kg)   SpO2 97%   BMI 30.71 kg/m   General: Pleasant and alert, shows good insight into his medical care Cardiac: Regular rate and rhythm, no murmur auscultated Pulmonary: No increased work of breathing, no cough, no wheeze, no crackles heard  ASSESSMENT/PLAN:   Neuropathy Have offered refill of gabapentin to help with the neuropathic leg pain the patient is describing.  I do think that it is possible that there is some mass-effect from the edema contributing the pain as well.    Lower extremity edema Completely bilateral 2+ 3+ pitting edema.  Advised patient to consider stockings which at this point he does not particular prefer.  We discussed CircAid's which he is going to think about.  I do think at this point that leg elevation at home which she says does work is at least a hold over until we can try and determine if this is lymphedema versus heart failure.  I think it is less likely CHF given Dr.  Saul Fordyce regular BNP that was ordered recently but he does have years since his last echo so I can get 1 of these.  My main suspicion is lymphedema versus venous stasis.     Sherene Sires, [place]

## 2019-09-10 NOTE — Telephone Encounter (Signed)
Patient called and informed forms were ready for pick up. Faxed to number provided. Original copy placed in front desk file cabinet.   Talbot Grumbling, RN

## 2019-09-10 NOTE — Patient Instructions (Signed)
Today we talked about your lower extremity edema.  It looks like Dr. Owens Shark did a full lab check on you recently and I agree with changing at amlodipine.  It does not look like you have been gaining weight, your lungs sound clear, and you have not been having any orthopnea so I do not think it makes sense to start prescribing new doses of Lasix to you.  It is possible this could be some lymphedema but as you have not had an echo for 5 years now we will order an echo to try and verify your heart's functional status.  If we have any new traumatic change in symptoms please reach out to Dr. Owens Shark immediately.  Dr. Criss Rosales

## 2019-09-11 DIAGNOSIS — G629 Polyneuropathy, unspecified: Secondary | ICD-10-CM | POA: Insufficient documentation

## 2019-09-11 NOTE — Assessment & Plan Note (Signed)
Completely bilateral 2+ 3+ pitting edema.  Advised patient to consider stockings which at this point he does not particular prefer.  We discussed CircAid's which he is going to think about.  I do think at this point that leg elevation at home which she says does work is at least a hold over until we can try and determine if this is lymphedema versus heart failure.  I think it is less likely CHF given Dr. Saul Fordyce regular BNP that was ordered recently but he does have years since his last echo so I can get 1 of these.  My main suspicion is lymphedema versus venous stasis.

## 2019-09-11 NOTE — Assessment & Plan Note (Signed)
Have offered refill of gabapentin to help with the neuropathic leg pain the patient is describing.  I do think that it is possible that there is some mass-effect from the edema contributing the pain as well.

## 2019-09-15 ENCOUNTER — Other Ambulatory Visit: Payer: Self-pay

## 2019-09-15 ENCOUNTER — Ambulatory Visit (HOSPITAL_COMMUNITY)
Admission: RE | Admit: 2019-09-15 | Discharge: 2019-09-15 | Disposition: A | Discharge status: Home / Self Care | Payer: Medicare HMO | Source: Ambulatory Visit | Attending: Family Medicine | Admitting: Family Medicine

## 2019-09-15 DIAGNOSIS — R600 Localized edema: Secondary | ICD-10-CM | POA: Diagnosis not present

## 2019-09-15 DIAGNOSIS — Z951 Presence of aortocoronary bypass graft: Secondary | ICD-10-CM | POA: Diagnosis not present

## 2019-09-15 DIAGNOSIS — I2510 Atherosclerotic heart disease of native coronary artery without angina pectoris: Secondary | ICD-10-CM | POA: Insufficient documentation

## 2019-09-15 DIAGNOSIS — I119 Hypertensive heart disease without heart failure: Secondary | ICD-10-CM | POA: Diagnosis not present

## 2019-09-15 NOTE — Progress Notes (Signed)
  Echocardiogram 2D Echocardiogram has been performed.  Jacquiline Zurcher A Pedro Whiters 09/15/2019, 8:49 AM

## 2019-09-21 ENCOUNTER — Encounter: Payer: Self-pay | Admitting: Cardiovascular Disease

## 2019-09-21 ENCOUNTER — Ambulatory Visit (INDEPENDENT_AMBULATORY_CARE_PROVIDER_SITE_OTHER): Payer: Medicare HMO | Admitting: Cardiovascular Disease

## 2019-09-21 ENCOUNTER — Other Ambulatory Visit: Payer: Self-pay

## 2019-09-21 VITALS — BP 114/52 | HR 66 | Ht 70.0 in | Wt 220.0 lb

## 2019-09-21 DIAGNOSIS — I10 Essential (primary) hypertension: Secondary | ICD-10-CM | POA: Diagnosis not present

## 2019-09-21 DIAGNOSIS — I2510 Atherosclerotic heart disease of native coronary artery without angina pectoris: Secondary | ICD-10-CM | POA: Diagnosis not present

## 2019-09-21 DIAGNOSIS — R600 Localized edema: Secondary | ICD-10-CM | POA: Diagnosis not present
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
## 2019-09-21 NOTE — Patient Instructions (Addendum)
Medication Instructions:  Your physician recommends that you continue on your current medications as directed. Please refer to the Current Medication list given to you today.  *If you need a refill on your cardiac medications before your next appointment, please call your pharmacy*   Lab Work: None Ordered If you have labs (blood work) drawn today and your tests are completely normal, you will receive your results only by: Marland Kitchen MyChart Message (if you have MyChart) OR . A paper copy in the mail If you have any lab test that is abnormal or we need to change your treatment, we will call you to review the results.   Testing/Procedures: None Ordered   Follow-Up: At [facility], you and your health needs are our priority.  As part of our continuing mission to provide you with exceptional heart care, we have created designated Provider Care Teams.  These Care Teams include your primary Cardiologist (physician) and Advanced Practice Providers (APPs -  Physician Assistants and Nurse Practitioners) who all work together to provide you with the care you need, when you need it.  We recommend signing up for the patient portal called "MyChart".  Sign up information is provided on this After Visit Summary.  MyChart is used to connect with patients for Virtual Visits (Telemedicine).  Patients are able to view lab/test results, encounter notes, upcoming appointments, etc.  Non-urgent messages can be sent to your provider as well.   To learn more about what you can do with MyChart, go to [email or web address].    Your next appointment:   6 month(s)  The format for your next appointment:   In Person  Provider:   Richardson Dopp, PA-C or Robbie Lis, PA-C   Other Instructions  For your  leg edema you  should do  the following 1. Leg elevation - I recommend the Lounge Dr. Leg rest.  See below for details  2. Salt restriction  -  Use potassium chloride instead of regular salt as a salt substitute. 3.  Walk regularly 4. Compression hose - [facility] 5. Weight loss    Available on [email or web address] Or  Go to [email or web address]

## 2019-09-23 ENCOUNTER — Encounter (HOSPITAL_BASED_OUTPATIENT_CLINIC_OR_DEPARTMENT_OTHER): Payer: Medicare HMO | Admitting: Internal Medicine

## 2019-09-27 ENCOUNTER — Ambulatory Visit (INDEPENDENT_AMBULATORY_CARE_PROVIDER_SITE_OTHER): Payer: Medicare HMO | Admitting: Infectious Disease

## 2019-09-27 ENCOUNTER — Other Ambulatory Visit: Payer: Self-pay

## 2019-09-27 VITALS — BP 141/80 | HR 75 | Wt 222.0 lb

## 2019-09-27 DIAGNOSIS — M8600 Acute hematogenous osteomyelitis, unspecified site: Secondary | ICD-10-CM

## 2019-09-27 DIAGNOSIS — M4646 Discitis, unspecified, lumbar region: Secondary | ICD-10-CM | POA: Diagnosis not present

## 2019-09-27 DIAGNOSIS — L89104 Pressure ulcer of unspecified part of back, stage 4: Secondary | ICD-10-CM

## 2019-09-27 DIAGNOSIS — K6812 Psoas muscle abscess: Secondary | ICD-10-CM

## 2019-09-27 DIAGNOSIS — F4310 Post-traumatic stress disorder, unspecified: Secondary | ICD-10-CM
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
## 2019-09-28 ENCOUNTER — Encounter (HOSPITAL_BASED_OUTPATIENT_CLINIC_OR_DEPARTMENT_OTHER): Payer: Medicare HMO | Attending: Internal Medicine | Admitting: Internal Medicine

## 2019-09-28 DIAGNOSIS — I10 Essential (primary) hypertension: Secondary | ICD-10-CM | POA: Insufficient documentation

## 2019-09-28 DIAGNOSIS — I252 Old myocardial infarction: Secondary | ICD-10-CM | POA: Diagnosis not present

## 2019-09-28 DIAGNOSIS — M1990 Unspecified osteoarthritis, unspecified site: Secondary | ICD-10-CM | POA: Insufficient documentation

## 2019-09-28 DIAGNOSIS — Z8616 Personal history of COVID-19: Secondary | ICD-10-CM | POA: Insufficient documentation

## 2019-09-28 DIAGNOSIS — L89224 Pressure ulcer of left hip, stage 4: Secondary | ICD-10-CM | POA: Insufficient documentation

## 2019-09-28 DIAGNOSIS — T8131XD Disruption of external operation (surgical) wound, not elsewhere classified, subsequent encounter: Secondary | ICD-10-CM | POA: Diagnosis present

## 2019-09-28 DIAGNOSIS — S7002XA Contusion of left hip, initial encounter: Secondary | ICD-10-CM | POA: Insufficient documentation

## 2019-09-28 DIAGNOSIS — M869 Osteomyelitis, unspecified: Secondary | ICD-10-CM | POA: Diagnosis not present

## 2019-09-28 LAB — BASIC METABOLIC PANEL WITH GFR
BUN/Creatinine Ratio: 30 (calc) — ABNORMAL HIGH (ref 6–22)
BUN: 31 mg/dL — ABNORMAL HIGH (ref 7–25)
CO2: 30 mmol/L (ref 20–32)
Calcium: 9.2 mg/dL (ref 8.6–10.3)
Chloride: 103 mmol/L (ref 98–110)
Creat: 1.02 mg/dL (ref 0.70–1.18)
GFR, Est African American: 85 mL/min/{1.73_m2} (ref >=60)
GFR, Est Non African American: 74 mL/min/{1.73_m2} (ref >=60)
Glucose, Bld: 132 mg/dL — ABNORMAL HIGH (ref 65–99)
Potassium: 5.3 mmol/L (ref 3.5–5.3)
Sodium: 140 mmol/L (ref 135–146)

## 2019-09-28 LAB — C-REACTIVE PROTEIN: CRP: 14.4 mg/L — ABNORMAL HIGH (ref <8.0)

## 2019-09-28 LAB — SEDIMENTATION RATE: Sed Rate: 19 mm/h (ref 0–20)

## 2019-09-30 ENCOUNTER — Other Ambulatory Visit: Payer: Self-pay

## 2019-09-30 ENCOUNTER — Encounter (HOSPITAL_BASED_OUTPATIENT_CLINIC_OR_DEPARTMENT_OTHER): Payer: Medicare HMO | Admitting: Internal Medicine

## 2019-09-30 MED ORDER — DOXYCYCLINE HYCLATE 100 MG PO TABS: 100.0000 mg | ORAL_TABLET | Freq: Two times a day (BID) | ORAL | 2 refills | Status: DC

## 2019-09-30 MED ORDER — CIPROFLOXACIN HCL 750 MG PO TABS: 750.0000 mg | ORAL_TABLET | Freq: Two times a day (BID) | ORAL | 2 refills | Status: DC

## 2019-09-30 NOTE — Progress Notes (Signed)
Ivan [name], Ivan [name] ([id number]) Visit Report for 09/28/2019 HPI Details Patient Name: Date of Service: Ivan [name]. 09/28/2019 3:00 PM Medical Record Number: [id number] Patient Account Number: [id number] Date of Birth/Sex: Treating RN: March 02, 1946 (74 y.o. 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 He required admission to hospital from 1/5 through 1/9 with Covid pneumonia. At that point he apparently left AMA. He was noted to have a left hip bruise at the time of discharge. I suspect this really was a deep tissue injury. He was readmitted from 1/13 through 1//27 with altered mental status Covid pneumonia and delirium. At that point he had a left hip and thigh wound which had worsened. Felt to be a pressure injury. He was seen by general surgery who recommended normal saline wet-to-dry twice daily he did not have a debridement. After he left at that point he was incapacitated by increasing lumbar pain. An MRI on 2/9 showed discitis and osteomyelitis at L1/2. He asked aspiration of 1 cm fluid however I think this culture negative. He was also noted at this point to have a very large wound over the left greater trochanter. He was taken to the OR by Dr. Marla Roe for debridement with ACell placement. He was placed in a wound VAC. A culture at that point showed Enterococcus and Enterobacter. An MRI on 3/15 showed no evidence of left hip septic arthritis or osteomyelitis. Extensive subcutaneous edema within the visualized soft tissue of the left thigh and prominent left inguinal lymph nodes likely reactive. He was started on IV cefepime and vancomycin for 6 weeks. He was discharged to [facility] facility as the patient.  There I gather the wound developed the necrotic surface. He was seen by the wound care doctor in house who correctly pointed out that a wound VAC would not do anything with a necrotic surface. Since then he has been using Santyl The patient is now at home. He was on vancomycin and cefepime. He developed hives and a generalized rash and was seen in the ER on 3/15 by Dr. Cain Sieve recommendation he was changed to meropenem however he also apparently developed hives after this. He says the original hives stopped after the cefepime was stopped and follow-up hives only developed after the meropenem. He is only taken 1 dose of the fear of the meropenem and now is on vancomycin. 3/25; the patient was seen by Dr. Linus Salmons on 3/22. Because of intolerances to meropenem which I communicated with Dr. De Burrs about he is now on daptomycin and Cipro until sometime in early April. He seems to be tolerating the antibiotics well. We are using Santyl back by wet-to-dry dressings. And making some improvement in the general surface of this very large wound 4/8; we are using Santyl back by wet-to-dry. We are making progress in terms of prepping the wound for a wound VAC. Still necrotic material requiring debridement. 4/16; we are using Santyl back by wet-to-dry. Not convinced that his wife is packing this properly there is a 6 cm tunnel at 12:00. We brought this to attention. Still requiring mechanical debridement 4/29; 2-week follow-up. In general the wound is better however there is considerable tunneling tunneling to the greater trochanter itself. There is still some debris in the deeper parts of this wound I did  not do mechanical debridement on this today. I am going to change the dressing to quarter strength Dakin's wet-to-dry. We will put a wound VAC through his insurance. His wife has had brain damage from a previous meningioma surgery. She is not comfortable around strangers in her home. The patient is concerned about  this 5/18; patient is using quarter strength Dakin's wet to dry. The problem is a deep pressure area over the left greater trochanter. There was some question from her intake nurses that this is actually come in and some question raised about whether he would require a wound VAC or not. The issue with the wound VAC was complicated predominantly by the patient's reluctance to allow people into his home i.e. home health although he tells me he has this mostly "sorted out" Electronic Signature(s) Signed: 09/30/2019 12:52:55 PM By: [name] MD Entered By: [name] on 09/29/2019 07:41:50 -------------------------------------------------------------------------------- Physical Exam Details Patient Name: Date of Service: Ivan [name], Ivan BERT W. 09/28/2019 3:00 PM Medical Record Number: [id number] Patient Account Number: [id number] Date of Birth/Sex: Treating RN: Dec 15, 1945 (74 y.o. Ivan [name] Primary Care Provider: [name] Other Clinician: Referring Provider: Treating Provider/Extender: Ronnie Derby, [name] in Treatment: 8 Constitutional Patient is hypotensive.. Pulse regular and within target range for patient.Marland Kitchen Respirations regular, non-labored and within target range.. Temperature is normal and within the target range for the patient.Marland Kitchen Appears in no distress. Integumentary (Hair, Skin) No erythema around the wound. Psychiatric appears at normal baseline. Notes Wound exam; the area in question over the left greater trochanter. I think the surface area of the orifice of this wound is actually come down a bit. It is true that the first bits of the granulation in this wound look better and the direct depth may be better however this wound still extensively tunnels beyond what the examining finger can appreciate. Clearly goes over the greater trochanter itself. Fortunately there is no purulent drainage no erythema around the wound and no clear evidence of deep  infection. Electronic Signature(s) Signed: 09/30/2019 12:52:55 PM By: [name] MD Entered By: [name] on 09/29/2019 07:43:46 -------------------------------------------------------------------------------- Physician Orders Details Patient Name: Date of Service: Ivan [name], Ivan BERT W. 09/28/2019 3:00 PM Medical Record Number: [id number] Patient Account Number: [id number] Date of Birth/Sex: Treating RN: 11/20/1945 (73 y.o. Ivan [name] Primary Care Provider: [name] Other Clinician: Referring Provider: Treating Provider/Extender: Fonnie Jarvis in Treatment: 8 Verbal / Phone Orders: No Diagnosis Coding ICD-10 Coding Code Description [phone] Pressure ulcer of left hip, stage 4 T81.31XD Disruption of external operation (surgical) wound, not elsewhere classified, subsequent encounter Follow-up Appointments ppointment in 2 weeks. - Thursday Return A Dressing Change Frequency Change dressing three times week. - changing dakins daily until wound vac arrives. Skin Barriers/Peri-Wound Care Skin Prep Wound Cleansing Clean wound with Wound Cleanser Primary Wound Dressing Wound #1 Left Trochanter Other: - 0.25% Dakin's Solution wet to dry use until wound vac arrives. use anascept wet to dry dressing in clinic. Secondary Dressing Foam Border - or bordered gauze. may use extra tape to secure dressing in place as needed. Negative Presssure Wound Therapy Wound #1 Left Trochanter Wound Vac to wound continuously at 159mm/hg pressure Black and White Foam combination - white foam to undermining. Off-Loading Other: - stay off of left hip to aid in offloading pressure. Home Health dmit to [facility] - for wound vac application. Education and teaching patient and wife how to apply and change wound A  vac. Electronic Signature(s) Signed: 09/28/2019 5:13:38 PM By: Carlene Coria RN Signed: 09/30/2019 12:52:55 PM By: [name]  MD Entered By: Carlene Coria on 09/28/2019 15:39:36 -------------------------------------------------------------------------------- Problem List Details Patient Name: Date of Service: Ivan [name], Ivan BERT W. 09/28/2019 3:00 PM Medical Record Number: [id number] Patient Account Number: [id number] Date of Birth/Sex: Treating RN: 28-Mar-1946 (73 y.o. Ivan [name] Primary Care Provider: [name] Other Clinician: Referring Provider: Treating Provider/Extender: Ronnie Derby, [name] in Treatment: 8 Active Problems ICD-10 Encounter Code Description Active Date MDM Diagnosis L89.224 Pressure ulcer of left hip, stage 4 08/05/2019 No Yes T81.31XD Disruption of external operation (surgical) wound, not elsewhere classified, 07/29/2019 No Yes subsequent encounter Inactive Problems Resolved Problems Electronic Signature(s) Signed: 09/30/2019 12:52:55 PM By: [name] MD Previous Signature: 09/28/2019 5:13:38 PM Version By: Carlene Coria RN Entered By: [name] on 09/29/2019 07:40:26 -------------------------------------------------------------------------------- Progress Note Details Patient Name: Date of Service: Ivan [name], Ivan BERT W. 09/28/2019 3:00 PM Medical Record Number: [id number] Patient Account Number: [id number] Date of Birth/Sex: Treating RN: 26-Oct-1945 (73 y.o. 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 He required admission to hospital from 1/5 through 1/9 with Covid pneumonia. At that point he apparently left AMA. He was noted to have a left hip bruise at the time of discharge. I suspect this really was a deep tissue injury. He was readmitted from 1/13 through 1//27 with altered  mental status Covid pneumonia and delirium. At that point he had a left hip and thigh wound which had worsened. Felt to be a pressure injury. He was seen by general surgery who recommended normal saline wet-to-dry twice daily he did not have a debridement. After he left at that point he was incapacitated by increasing lumbar pain. An MRI on 2/9 showed discitis and osteomyelitis at L1/2. He asked aspiration of 1 cm fluid however I think this culture negative. He was also noted at this point to have a very large wound over the left greater trochanter. He was taken to the OR by Dr. Marla Roe for debridement with ACell placement. He was placed in a wound VAC. A culture at that point showed Enterococcus and Enterobacter. An MRI on 3/15 showed no evidence of left hip septic arthritis or osteomyelitis. Extensive subcutaneous edema within the visualized soft tissue of the left thigh and prominent left inguinal lymph nodes likely reactive. He was started on IV cefepime and vancomycin for 6 weeks. He was discharged to [facility] facility as the patient. There I gather the wound developed the necrotic surface. He was seen by the wound care doctor in house who correctly pointed out that a wound VAC would not do anything with a necrotic surface. Since then he has been using Santyl The patient is now at home. He was on vancomycin and cefepime. He developed hives and a generalized rash and was seen in the ER on 3/15 by Dr. Cain Sieve recommendation he was changed to meropenem however he also apparently developed hives after this. He says the original hives stopped after the cefepime was stopped and follow-up hives only developed after the meropenem. He is only taken 1 dose of the fear of the meropenem and now is on vancomycin. 3/25; the patient was seen by Dr. Linus Salmons on 3/22. Because of intolerances to meropenem  which I communicated with Dr. Alton Revere about he is now on daptomycin and Cipro until sometime in early April.  He seems to be tolerating the antibiotics well. We are using Santyl back by wet-to-dry dressings. And making some improvement in the general surface of this very large wound 4/8; we are using Santyl back by wet-to-dry. We are making progress in terms of prepping the wound for a wound VAC. Still necrotic material requiring debridement. 4/16; we are using Santyl back by wet-to-dry. Not convinced that his wife is packing this properly there is a 6 cm tunnel at 12:00. We brought this to attention. Still requiring mechanical debridement 4/29; 2-week follow-up. In general the wound is better however there is considerable tunneling tunneling to the greater trochanter itself. There is still some debris in the deeper parts of this wound I did not do mechanical debridement on this today. I am going to change the dressing to quarter strength Dakin's wet-to-dry. We will put a wound VAC through his insurance. His wife has had brain damage from a previous meningioma surgery. She is not comfortable around strangers in her home. The patient is concerned about this 5/18; patient is using quarter strength Dakin's wet to dry. The problem is a deep pressure area over the left greater trochanter. There was some question from her intake nurses that this is actually come in and some question raised about whether he would require a wound VAC or not. The issue with the wound VAC was complicated predominantly by the patient's reluctance to allow people into his home i.e. home health although he tells me he has this mostly "sorted out" Objective Constitutional Patient is hypotensive.. Pulse regular and within target range for patient.Marland Kitchen Respirations regular, non-labored and within target range.. Temperature is normal and within the target range for the patient.Marland Kitchen Appears in no distress. Vitals Time Taken: 3:40 PM, Height: 70 in, Weight: 198 lbs, BMI: 28.4, Temperature: 97.8 F, Pulse: 62 bpm, Respiratory Rate: 19 breaths/min,  Blood Pressure: 148/66 mmHg. Psychiatric appears at normal baseline. General Notes: Wound exam; the area in question over the left greater trochanter. I think the surface area of the orifice of this wound is actually come down a bit. It is true that the first bits of the granulation in this wound look better and the direct depth may be better however this wound still extensively tunnels beyond what the examining finger can appreciate. Clearly goes over the greater trochanter itself. Fortunately there is no purulent drainage no erythema around the wound and no clear evidence of deep infection. Integumentary (Hair, Skin) No erythema around the wound. Wound #1 status is Open. Original cause of wound was Surgical Injury. The wound is located on the Left Trochanter. The wound measures 3.6cm length x 2.8cm width x 3.4cm depth; 7.917cm^2 area and 26.917cm^3 volume. There is muscle and Fat Layer (Subcutaneous Tissue) Exposed exposed. There is no tunneling noted, however, there is undermining starting at 9:00 and ending at 12:00 with a maximum distance of 4.5cm. There is a large amount of serous drainage noted. The wound margin is well defined and not attached to the wound base. There is large (67-100%) pink granulation within the wound bed. There is a small (1-33%) amount of necrotic tissue within the wound bed including Adherent Slough. Assessment Active Problems ICD-10 Pressure ulcer of left hip, stage 4 Disruption of external operation (surgical) wound, not elsewhere classified, subsequent encounter Plan Follow-up Appointments: Return Appointment in 2 weeks. - Thursday Dressing Change Frequency: Change dressing three  times week. - changing dakins daily until wound vac arrives. Skin Barriers/Peri-Wound Care: Skin Prep Wound Cleansing: Clean wound with Wound Cleanser Primary Wound Dressing: Wound #1 Left Trochanter: Other: - 0.25% Dakin's Solution wet to dry use until wound vac arrives. use  anascept wet to dry dressing in clinic. Secondary Dressing: Foam Border - or bordered gauze. may use extra tape to secure dressing in place as needed. Negative Presssure Wound Therapy: Wound #1 Left Trochanter: Wound Vac to wound continuously at 13mm/hg pressure Black and White Foam combination - white foam to undermining. Off-Loading: Other: - stay off of left hip to aid in offloading pressure. Home Health: Admit to [facility] for Sk[facility] - for wound vac application. Education and teaching patient and wife how to apply and change wound vac. 1. Although the condition of the visible granulation has improved with the Dakin's there is still absolutely no doubt in my mind that this is going to require a wound VAC and a prolonged course of a wound VAC. The major problem here is the deep tunneling that is really almost impossible to do a reproducible measurement on. 2. He has completed antibiotics however a lot of this was directed at discitis and osteomyelitis. He followed with Dr. Linus Salmons of infectious disease. I am not sure where we are with that currently. Before we put the wound VAC on in about 2 weeks we will try to look this over. There is no current evidence of infection that requires additional antibiotics. 3. My plan is to order the wound VAC in 2 weeks time. We will look over the infectious disease notes of any and see where we are with their plan of care. We have made general improvements in the condition of this wound since the first time I saw this and a considerable improvement in the surface area but still the plan of care will have to include a wound VAC to get granulation of the large amount of tunneling that is present Electronic Signature(s) Signed: 09/30/2019 12:52:55 PM By: [name] MD Entered By: [name] on 09/29/2019 07:47:14 -------------------------------------------------------------------------------- SuperBill Details Patient Name: Date of  Service: Ivan [name], Ivan BERT W. 09/28/2019 Medical Record Number: [id number] Patient Account Number: [id number] Date of Birth/Sex: Treating RN: Mar 21, 1946 (73 y.o. Ivan [name] Primary Care Provider: [name] Other Clinician: Referring Provider: Treating Provider/Extender: Ronnie Derby, [name] in Treatment: 8 Diagnosis Coding ICD-10 Codes Code Description [phone] Pressure ulcer of left hip, stage 4 T81.31XD Disruption of external operation (surgical) wound, not elsewhere classified, subsequent encounter Facility Procedures The patient participates with Medicare or their insurance follows the Medicare Facility Guidelines: CPT4 Code Description Modifier Quantity [id number] [place] VISIT-LEV 3 EST PT 1 Physician Procedures : CPT4 Code Description Modifier [id number] - WC PHYS LEVEL 4 - EST PT ICD-10 Diagnosis Description L89.224 Pressure ulcer of left hip, stage 4 T81.31XD Disruption of external operation (surgical) wound, not elsewhere classified, subsequent encounter Quantity: 1 Electronic Signature(s) Signed: 09/30/2019 12:52:55 PM By: [name] MD Previous Signature: 09/28/2019 5:13:38 PM Version By: Carlene Coria RN Entered By: [name] on 09/29/2019 07:47:34

## 2019-10-01 NOTE — Progress Notes (Signed)
SKYY, MCKNIGHT ([id number]) Visit Report for 09/28/2019 Arrival Information Details Patient Name: Date of Service: Ivan [name]. 09/28/2019 3:00 PM Medical Record Number: [id number] Patient Account Number: [id number] Date of Birth/Sex: Treating RN: [birth year] (74 y.o. Ivan [name] Primary Care Monserrate Blaschke: [name] Other Clinician: Referring Saurabh Hettich: Treating Charlise Giovanetti/Extender: Ronnie Derby, [name] in Treatment: 8 Visit Information History Since Last Visit Added or deleted any medications: No Patient Arrived: Ambulatory Any new allergies or adverse reactions: No Arrival Time: 15:40 Had a fall or experienced change in No Accompanied By: self activities of daily living that may affect Transfer Assistance: None risk of falls: Patient Requires Transmission-Based Precautions: No Signs or symptoms of abuse/neglect since last visito No Patient Has Alerts: No Hospitalized since last visit: No Implantable device outside of the clinic excluding No cellular tissue based products placed in the center since last visit: Has Dressing in Place as Prescribed: Yes Pain Present Now: No Electronic Signature(s) Signed: 09/28/2019 5:47:21 PM By: Kela Millin Entered By: Kela Millin on 09/28/2019 15:40:20 -------------------------------------------------------------------------------- Clinic Level of Care Assessment Details Patient Name: Date of Service: Ivan [name]. 09/28/2019 3:00 PM Medical Record Number: [id number] Patient Account Number: [id number] Date of Birth/Sex: Treating RN: Aug 25, 1945 (73 y.o. Ivan [name]) Carlene Coria Primary Care Theodora Lalanne: [name] Other Clinician: Referring Twala Collings: Treating Danielly Ackerley/Extender: Ronnie Derby, [name] in Treatment: 8 Clinic Level of Care Assessment Items TOOL 4 Quantity Score X- 1 0 Use when only an EandM is performed on FOLLOW-UP visit ASSESSMENTS - Nursing Assessment / Reassessment X- 1  10 Reassessment of Co-morbidities (includes updates in patient status) X- 1 5 Reassessment of Adherence to Treatment Plan ASSESSMENTS - Wound and Skin A ssessment / Reassessment X - Simple Wound Assessment / Reassessment - one wound 1 5 _0  - 0 Complex Wound Assessment / Reassessment - multiple wounds _1  - 0 Dermatologic / Skin Assessment (not related to wound area) ASSESSMENTS - Focused Assessment _2  - 0 Circumferential Edema Measurements - multi extremities _3  - 0 Nutritional Assessment / Counseling / Intervention _4  - 0 Lower Extremity Assessment (monofilament, tuning fork, pulses) _5  - 0 Peripheral Arterial Disease Assessment (using hand held doppler) ASSESSMENTS - Ostomy and/or Continence Assessment and Care _6  - 0 Incontinence Assessment and Management _7  - 0 Ostomy Care Assessment and Management (repouching, etc.) PROCESS - Coordination of Care X - Simple Patient / Family Education for ongoing care 1 15 _8  - 0 Complex (extensive) Patient / Family Education for ongoing care X- 1 10 Staff obtains Consents, Records, T Results / Process Orders est _9  - 0 Staff telephones HHA, Nursing Homes / Clarify orders / etc _10  - 0 Routine Transfer to another Facility (non-emergent condition) _11  - 0 Routine Hospital Admission (non-emergent condition) _12  - 0 New Admissions / Biomedical engineer / Ordering NPWT Apligraf, etc. , _13  - 0 Emergency Hospital Admission (emergent condition) X- 1 10 Simple Discharge Coordination _14  - 0 Complex (extensive) Discharge Coordination PROCESS - Special Needs _15  - 0 Pediatric / Minor Patient Management _16  - 0 Isolation Patient Management _17  - 0 Hearing / Language / Visual special needs _18  - 0 Assessment of Community assistance (transportation, D/C planning, etc.) _19  - 0 Additional assistance / Altered mentation _20  - 0 Support Surface(s) Assessment (bed, cushion, seat, etc.) INTERVENTIONS - Wound Cleansing / Measurement X - Simple  Wound Cleansing - one wound 1 5 _21  - 0 Complex Wound Cleansing - multiple wounds X- 1 5 Wound Imaging (photographs - any number  of wounds) _0  - 0 Wound Tracing (instead of photographs) X- 1 5 Simple Wound Measurement - one wound _1  - 0 Complex Wound Measurement - multiple wounds INTERVENTIONS - Wound Dressings _2  - 0 Small Wound Dressing one or multiple wounds _3  - 0 Medium Wound Dressing one or multiple wounds X- 1 20 Large Wound Dressing one or multiple wounds X- 1 5 Application of Medications - topical <[id number]>_7<\/GOTLXBWIOMBTDHRC>_1  - 0 Application of Medications - injection INTERVENTIONS - Miscellaneous _5  - 0 External ear exam _6  - 0 Specimen Collection (cultures, biopsies, blood, body fluids, etc.) _7  - 0 Specimen(s) / Culture(s) sent or taken to Lab for analysis _8  - 0 Patient Transfer (multiple staff / Harrel Lemon Lift / Similar devices) _9  - 0 Simple Staple / Suture removal (25 or less) _10  - 0 Complex Staple / Suture removal (26 or more) _11  - 0 Hypo / Hyperglycemic Management (close monitor of Blood Glucose) _12  - 0 Ankle / Brachial Index (ABI) - do not check if billed separately X- 1 5 Vital Signs Has the patient been seen at the hospital within the last three years: Yes Total Score: 100 Level Of Care: New/Established - Level 3 Electronic Signature(s) Signed: 09/28/2019 5:13:38 PM By: Carlene Coria RN Entered By: Carlene Coria on 09/28/2019 16:49:03 -------------------------------------------------------------------------------- Encounter Discharge Information Details Patient Name: Date of Service: Ivan [name], Ivan BERT W. 09/28/2019 3:00 PM Medical Record Number: [id number] Patient Account Number: [id number] Date of Birth/Sex: Treating RN: 10-20-1945 (74 y.o. [name] Primary Care Amauri Medellin: [name] Other Clinician: Referring Elika Godar: Treating Falecia Vannatter/Extender: Fonnie Jarvis in Treatment: 8 Encounter Discharge Information Items Discharge Condition:  Stable Ambulatory Status: Ambulatory Discharge Destination: Home Transportation: Private Auto Accompanied By: alone Schedule Follow-up Appointment: Yes Clinical Summary of Care: Patient Declined Electronic Signature(s) Signed: 09/29/2019 12:46:48 PM By: Levan Hurst RN, BSN Entered By: Levan Hurst on 09/28/2019 16:50:09 -------------------------------------------------------------------------------- Lower Extremity Assessment Details Patient Name: Date of Service: Ivan [name], Ivan BERT W. 09/28/2019 3:00 PM Medical Record Number: [id number] Patient Account Number: [id number] Date of Birth/Sex: Treating RN: 07-May-1946 (74 y.o. Ivan [name] Primary Care Oreste Majeed: [name] Other Clinician: Referring Yuvonne Lanahan: Treating Lanee Chain/Extender: Ronnie Derby, [name] in Treatment: 8 Electronic Signature(s) Signed: 09/28/2019 5:47:21 PM By: Kela Millin Entered By: Kela Millin on 09/28/2019 15:40:56 -------------------------------------------------------------------------------- Multi Wound Chart Details Patient Name: Date of Service: Ivan [name], Ivan BERT W. 09/28/2019 3:00 PM Medical Record Number: [id number] Patient Account Number: [id number] Date of Birth/Sex: Treating RN: [birth year] (73 y.o. Ivan [name] Primary Care Benna Arno: [name] Other Clinician: Referring Bodhi Moradi: Treating Leylani Duley/Extender: Ronnie Derby, [name] in Treatment: 8 Vital Signs Height(in): 70 Pulse(bpm): 97 Weight(lbs): 198 Blood Pressure(mmHg): 148/66 Body Mass Index(BMI): 28 Temperature(F): 97.8 Respiratory Rate(breaths/min): 19 Photos: [1:No Photos Left Trochanter] [N/A:N/A N/A] Wound Location: [1:Surgical Injury] [N/A:N/A] Wounding Event: [1:Pressure Ulcer] [N/A:N/A] Primary Etiology: [1:Open Surgical Wound] [N/A:N/A] Secondary Etiology: [1:Cataracts, Coronary Artery Disease,] [N/A:N/A] Comorbid History: [1:Hypertension, Myocardial Infarction,  Osteoarthritis, Osteomyelitis 07/01/2019] [N/A:N/A] Date Acquired: [1:8] [N/A:N/A] Weeks of Treatment: [1:Open] [N/A:N/A] Wound Status: [1:3.6x2.8x3.4] [N/A:N/A] Measurements L x W x D (cm) [1:7.917] [N/A:N/A] A (cm) : rea [1:26.917] [N/A:N/A] Volume (cm) : [1:63.30%] [N/A:N/A] % Reduction in A rea: [1:10.80%] [N/A:N/A] % Reduction in Volume: [1:9] Starting Position 1 (o'clock): [1:12] Ending Position 1 (o'clock): [1:4.5] Maximum Distance 1 (cm): [1:Yes] [N/A:N/A] Undermining: [1:Category/Stage IV] [N/A:N/A] Classification: [1:Large] [N/A:N/A] Exudate A mount: [1:Serous] [N/A:N/A] Exudate Type: [1:amber] [N/A:N/A] Exudate Color: [1:Well defined, not attached] [N/A:N/A] Wound Margin: [1:Large (67-100%)] [N/A:N/A]  Granulation A mount: [1:Pink] [N/A:N/A] Granulation Quality: [1:Small (1-33%)] [N/A:N/A] Necrotic A mount: [1:Fat Layer (Subcutaneous Tissue)] [N/A:N/A] Exposed Structures: [1:Exposed: Yes Muscle: Yes Fascia: No Tendon: No Joint: No Bone: No None] [N/A:N/A] Treatment Notes Wound #1 (Left Trochanter) 1. Cleanse With Wound Cleanser 2. Periwound Care Skin Prep 3. Primary Dressing Applied Other primary dressing (specifiy in notes) 4. Secondary Dressing Foam Border Dressing Notes anasept gel moistened gauze packing Electronic Signature(s) Signed: 09/30/2019 12:52:55 PM By: [name] MD Signed: 10/01/2019 9:36:08 AM By: Carlene Coria RN Entered By: [name] on 09/29/2019 07:40:35 -------------------------------------------------------------------------------- Multi-Disciplinary Care Plan Details Patient Name: Date of Service: Ivan [name], Larina Earthly. 09/28/2019 3:00 PM Medical Record Number: [id number] Patient Account Number: [id number] Date of Birth/Sex: Treating RN: 1945/07/25 (73 y.o. Ivan [name] Primary Care Aminta Sakurai: [name] Other Clinician: Referring Doneen Ollinger: Treating Ceejay Kegley/Extender: Ronnie Derby, [name] in Treatment:  8 Active Inactive Pain, Acute or Chronic Nursing Diagnoses: Pain, acute or chronic: actual or potential Potential alteration in comfort, pain Goals: Patient will verbalize adequate pain control and receive pain control interventions during procedures as needed Date Initiated: 07/29/2019 Target Resolution Date: 10/01/2019 Goal Status: Active Patient/caregiver will verbalize comfort level met Date Initiated: 07/29/2019 Date Inactivated: 08/19/2019 Target Resolution Date: 08/20/2019 Goal Status: Met Interventions: Encourage patient to take pain medications as prescribed Provide education on pain management Reposition patient for comfort Treatment Activities: Administer pain control measures as ordered : 07/29/2019 Notes: Electronic Signature(s) Signed: 09/28/2019 5:13:38 PM By: Carlene Coria RN Entered By: Carlene Coria on 09/28/2019 15:40:18 -------------------------------------------------------------------------------- Pain Assessment Details Patient Name: Date of Service: Ivan [name], Ivan BERT W. 09/28/2019 3:00 PM Medical Record Number: [id number] Patient Account Number: [id number] Date of Birth/Sex: Treating RN: Dec 30, 1945 (74 y.o. Ivan [name] Primary Care Birch Farino: [name] Other Clinician: Referring Chibuikem Thang: Treating Meredeth Furber/Extender: Ronnie Derby, [name] in Treatment: 8 Active Problems Location of Pain Severity and Description of Pain Patient Has Paino No Site Locations Pain Management and Medication Current Pain Management: Electronic Signature(s) Signed: 09/28/2019 5:47:21 PM By: Kela Millin Entered By: Kela Millin on 09/28/2019 15:40:50 -------------------------------------------------------------------------------- Patient/Caregiver Education Details Patient Name: Date of Service: Ivan [name], Ivan BERT W. 5/18/2021andnbsp3:00 PM Medical Record Number: [id number] Patient Account Number: [id number] Date of Birth/Gender: Treating  RN: 1945-12-09 (73 y.o. Ivan [name] Primary Care Physician: [name] Other Clinician: Referring Physician: Treating Physician/Extender: Fonnie Jarvis in Treatment: 8 Education Assessment Education Provided To: Patient Education Topics Provided Pain: Methods: Explain/Verbal Responses: State content correctly Electronic Signature(s) Signed: 09/28/2019 5:13:38 PM By: Carlene Coria RN Entered By: Carlene Coria on 09/28/2019 15:40:34 -------------------------------------------------------------------------------- Wound Assessment Details Patient Name: Date of Service: Ivan [name], Ivan BERT W. 09/28/2019 3:00 PM Medical Record Number: [id number] Patient Account Number: [id number] Date of Birth/Sex: Treating RN: July 24, 1945 (74 y.o. Ivan [name] Primary Care Jasean Ambrosia: [name] Other Clinician: Referring Ronn Smolinsky: Treating Mariaguadalupe Fialkowski/Extender: Ronnie Derby, [name] in Treatment: 8 Wound Status Wound Number: 1 Primary Pressure Ulcer Etiology: Wound Location: Left Trochanter Secondary Open Surgical Wound Wounding Event: Surgical Injury Etiology: Date Acquired: 07/01/2019 Wound Open Weeks Of Treatment: 8 Status: Clustered Wound: No Comorbid Cataracts, Coronary Artery Disease, Hypertension, Myocardial History: Infarction, Osteoarthritis, Osteomyelitis Wound Measurements Length: (cm) 3.6 Width: (cm) 2.8 Depth: (cm) 3.4 Area: (cm) 7.917 Volume: (cm) 26.917 % Reduction in Area: 63.3% % Reduction in Volume: 10.8% Epithelialization: None Tunneling: No Undermining: Yes Starting Position (o'clock): 9 Ending Position (o'clock): 12 Maximum Distance: (cm) 4.5 Wound Description Classification: Category/Stage IV Wound Margin:  Well defined, not attached Exudate Amount: Large Exudate Type: Serous Exudate Color: amber Foul Odor After Cleansing: No Slough/Fibrino Yes Wound Bed Granulation Amount: Large (67-100%) Exposed  Structure Granulation Quality: Pink Fascia Exposed: No Necrotic Amount: Small (1-33%) Fat Layer (Subcutaneous Tissue) Exposed: Yes Necrotic Quality: Adherent Slough Tendon Exposed: No Muscle Exposed: Yes Necrosis of Muscle: No Joint Exposed: No Bone Exposed: No Treatment Notes Wound #1 (Left Trochanter) 1. Cleanse With Wound Cleanser 2. Periwound Care Skin Prep 3. Primary Dressing Applied Other primary dressing (specifiy in notes) 4. Secondary Dressing Foam Border Dressing Notes anasept gel moistened gauze packing Electronic Signature(s) Signed: 09/28/2019 5:47:21 PM By: Kela Millin Entered By: Kela Millin on 09/28/2019 15:43:38 -------------------------------------------------------------------------------- Vitals Details Patient Name: Date of Service: Ivan [name], Ivan BERT W. 09/28/2019 3:00 PM Medical Record Number: [id number] Patient Account Number: [id number] Date of Birth/Sex: Treating RN: 25-May-1945 (74 y.o. Ivan [name] Primary Care Bakari Nikolai: [name] Other Clinician: Referring Pennye Beeghly: Treating Holland Kotter/Extender: Ronnie Derby, [name] in Treatment: 8 Vital Signs Time Taken: 15:40 Temperature (F): 97.8 Height (in): 70 Pulse (bpm): 62 Weight (lbs): 198 Respiratory Rate (breaths/min): 19 Body Mass Index (BMI): 28.4 Blood Pressure (mmHg): 148/66 Reference Range: 80 - 120 mg / dl Electronic Signature(s) Signed: 09/28/2019 5:47:21 PM By: Kela Millin Entered By: Kela Millin on 09/28/2019 15:40:45

## 2019-10-04 ENCOUNTER — Telehealth: Payer: Self-pay

## 2019-10-04 DIAGNOSIS — I70219 Atherosclerosis of native arteries of extremities with intermittent claudication, unspecified extremity: Secondary | ICD-10-CM

## 2019-10-04 DIAGNOSIS — I7143 Infrarenal abdominal aortic aneurysm, without rupture: Secondary | ICD-10-CM

## 2019-10-04 NOTE — Telephone Encounter (Signed)
Referral placed to vein and vascular.  Please recommend patient be seen in access to care this week to check in regarding fall.   [name], MD  Family Medicine Teaching Service

## 2019-10-04 NOTE — Progress Notes (Signed)
**Note Ivan-Identified via Obfuscation** RAKEEN, GINGER ([id number]) Visit Report for 08/26/2019 Debridement Details Patient Name: Date of Service: Ivan [name]. 08/26/2019 1:45 PM Medical Record Number: [id number] Patient Account Number: [id number] Date of Birth/Sex: Treating RN: 10/12/1945 (74 y.o. Ivan [name] Other Clinician: Referring Provider: Treating Provider/Extender: Ivan [name] in Treatment: 4 Debridement Performed for Assessment: Wound #1 Left Trochanter Performed By: Physician Ivan [name]., MD Debridement Type: Debridement Level of Consciousness (Pre-procedure): Awake and Alert Pre-procedure Verification/Time Out Yes - 14:10 Taken: Start Time: 14:11 Pain Control: Lidocaine 4% T opical Solution T Area Debrided (L x W): otal 3.8 (cm) x 4 (cm) = 15.2 (cm) Tissue and other material debrided: Viable, Non-Viable, Muscle, Slough, Subcutaneous, Skin: Dermis , Fibrin/Exudate, Slough Level: Skin/Subcutaneous Tissue/Muscle Debridement Description: Excisional Instrument: Curette Bleeding: Minimum Hemostasis Achieved: Pressure End Time: 14:15 Procedural Pain: 0 Post Procedural Pain: 0 Response to Treatment: Procedure was tolerated well Level of Consciousness (Post- Awake and Alert procedure): Post Debridement Measurements of Total Wound Length: (cm) 3.8 Stage: Category/Stage IV Width: (cm) 4 Depth: (cm) 3.2 Volume: (cm) 38.202 Character of Wound/Ulcer Post Debridement: Requires Further Debridement Post Procedure Diagnosis Same as Pre-procedure Electronic Signature(s) Signed: 08/28/2019 6:55:41 AM By: Ivan Ham MD Signed: 10/04/2019 1:31:05 PM By: Ivan [name] Entered By: Ivan [name] on 08/26/2019 14:39:14 -------------------------------------------------------------------------------- HPI Details Patient Name: Date of Service: Ivan [name], Ivan Ivan W. 08/26/2019 1:45 PM Medical Record Number: [id number] Patient Account  Number: [id number] Date of Birth/Sex: Treating RN: 1946/03/30 (74 y.o. 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 He required admission to hospital from 1/5 through 1/9 with Covid pneumonia. At that point he apparently left AMA. He was noted to have a left hip bruise at the time of discharge. I suspect this really was a deep tissue injury. He was readmitted from 1/13 through 1//27 with altered mental status Covid pneumonia and delirium. At that point he had a left hip and thigh wound which had worsened. Felt to be a pressure injury. He was seen by general surgery who recommended normal saline wet-to-dry twice daily he did not have a debridement. After he left at that point he was incapacitated by increasing lumbar pain. An MRI on 2/9 showed discitis and osteomyelitis at L1/2. He asked aspiration of 1 cm fluid however I think this culture negative. He was also noted at this point to have a very large wound over the left greater trochanter. He was taken to the OR by Dr. Marla [name] for debridement with ACell placement. He was placed in a wound VAC. A culture at that point showed Enterococcus and Enterobacter. An MRI on 3/15 showed no evidence of left hip septic arthritis or osteomyelitis. Extensive subcutaneous edema within the visualized soft tissue of the left thigh and prominent left inguinal lymph nodes likely reactive. He was started on IV cefepime and vancomycin for 6 weeks. He was discharged to [place] facility as the patient. There I gather the wound developed the necrotic surface. He was seen by the wound care doctor in house who correctly pointed out that a wound VAC would not do anything with a necrotic surface.  Since then he has been using Santyl The patient is now at home. He was on vancomycin and cefepime. He developed  hives and a generalized rash and was seen in the ER on 3/15 by Dr. Cain [name] recommendation he was changed to meropenem however he also apparently developed hives after this. He says the original hives stopped after the cefepime was stopped and follow-up hives only developed after the meropenem. He is only taken 1 dose of the fear of the meropenem and now is on vancomycin. 3/25; the patient was seen by Dr. Linus [name] on 3/22. Because of intolerances to meropenem which I communicated with Dr. De [name] about he is now on daptomycin and Cipro until sometime in early April. He seems to be tolerating the antibiotics well. We are using Santyl back by wet-to-dry dressings. And making some improvement in the general surface of this very large wound 4/8; we are using Santyl back by wet-to-dry. We are making progress in terms of prepping the wound for a wound VAC. Still necrotic material requiring debridement. 4/16; we are using Santyl back by wet-to-dry. Not convinced that his wife is packing this properly there is a 6 cm tunnel at 12:00. We brought this to attention. Still requiring mechanical debridement Electronic Signature(s) Signed: 08/28/2019 6:55:41 AM By: Ivan Ham MD Entered By: Ivan [name] on 08/26/2019 14:39:51 -------------------------------------------------------------------------------- Physical Exam Details Patient Name: Date of Service: Ivan [name], Ivan Ivan W. 08/26/2019 1:45 PM Medical Record Number: [id number] Patient Account Number: [id number] Date of Birth/Sex: Treating RN: [birth year] (74 y.o. Ivan [name] Other Clinician: Referring Provider: Treating Provider/Extender: Ivan [name], Ivan [name] in Treatment: 4 Constitutional Sitting or standing Blood Pressure is within target range for patient.. Pulse regular and within  target range for patient.Marland Kitchen Respirations regular, non-labored and within target range.. Temperature is normal and within the target range for the patient.Marland Kitchen Appears in no distress. Notes Wound exam; the area in question is on the left greater trochanter. Deep tunneling area at 12:00 approximately 6 cm. There is still necrotic tissue although considerably less than what we had vigorous debridement with a #5 curette removing necrotic tissue probably muscle down to the depth of this. I did not appreciate any exposed bone which is a major improvement. No evidence of surrounding infection Electronic Signature(s) Signed: 08/28/2019 6:55:41 AM By: Ivan Ham MD Entered By: Ivan [name] on 08/26/2019 14:41:08 -------------------------------------------------------------------------------- Physician Orders Details Patient Name: Date of Service: Ivan [name], Ivan Ivan W. 08/26/2019 1:45 PM Medical Record Number: [id number] Patient Account Number: [id number] Date of Birth/Sex: Treating RN: 08-05-1945 (74 y.o. Ivan [name] Other Clinician: Referring Provider: Treating Provider/Extender: Ivan [name] in Treatment: 4 Verbal / Phone Orders: No Diagnosis Coding ICD-10 Coding Code Description [phone] Pressure ulcer of left hip, stage 4 T81.31XD Disruption of external operation (surgical) wound, not elsewhere classified, subsequent encounter Follow-up Appointments ppointment in 2 weeks. - Thursday Return A Dressing Change Frequency Change dressing every day. Skin Barriers/Peri-Wound Care Skin Prep Wound Cleansing Clean wound with Wound Cleanser Primary Wound Dressing Wound #1 Left Trochanter Santyl Ointment - backed with saline moisten gauze. Secondary Dressing Foam Border - or bordered gauze. may use extra tape to secure dressing in place as needed. Off-Loading Other: - stay off of left hip to aid in offloading  pressure. Electronic Signature(s) Signed: 08/26/2019 4:13:32 PM By: Ivan [name] Signed: 08/28/2019 6:55:41 AM By: Ivan Ham MD Entered By: Ivan [name] on 08/26/2019 14:17:32 -------------------------------------------------------------------------------- Problem List Details Patient Name: Date of Service: Ivan [name], Ivan Ivan W. 08/26/2019 1:45 PM Medical Record Number: [id number] Patient Account Number:  [id number] Date of Birth/Sex: Treating RN: 11-29-1945 (74 y.o. Ivan [name] Other Clinician: Referring Provider: Treating Provider/Extender: Ivan [name], Ivan [name] in Treatment: 4 Active Problems ICD-10 Evaluated Encounter Code Description Active Date Today Diagnosis L89.224 Pressure ulcer of left hip, stage 4 08/05/2019 No Yes T81.31XD Disruption of external operation (surgical) wound, not elsewhere classified, 07/29/2019 No Yes subsequent encounter Inactive Problems Resolved Problems Electronic Signature(s) Signed: 08/28/2019 6:55:41 AM By: Ivan Ham MD Entered By: Ivan [name] on 08/26/2019 14:38:54 -------------------------------------------------------------------------------- Progress Note Details Patient Name: Date of Service: Ivan [name], Ivan Ivan W. 08/26/2019 1:45 PM Medical Record Number: [id number] Patient Account Number: [id number] Date of Birth/Sex: Treating RN: [birth year] (74 y.o. 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 He required admission to hospital from 1/5 through 1/9 with Covid pneumonia. At that point he apparently left AMA. He was noted to have a left hip bruise at the time of discharge. I suspect this really was a  deep tissue injury. He was readmitted from 1/13 through 1//27 with altered mental status Covid pneumonia and delirium. At that point he had a left hip and thigh wound which had worsened. Felt to be a pressure injury. He was seen by general surgery who recommended normal saline wet-to-dry twice daily he did not have a debridement. After he left at that point he was incapacitated by increasing lumbar pain. An MRI on 2/9 showed discitis and osteomyelitis at L1/2. He asked aspiration of 1 cm fluid however I think this culture negative. He was also noted at this point to have a very large wound over the left greater trochanter. He was taken to the OR by Dr. Marla [name] for debridement with ACell placement. He was placed in a wound VAC. A culture at that point showed Enterococcus and Enterobacter. An MRI on 3/15 showed no evidence of left hip septic arthritis or osteomyelitis. Extensive subcutaneous edema within the visualized soft tissue of the left thigh and prominent left inguinal lymph nodes likely reactive. He was started on IV cefepime and vancomycin for 6 weeks. He was discharged to [facility] facility as the patient. There I gather the wound developed the necrotic surface. He was seen by the wound care doctor in house who correctly pointed out that a wound VAC would not do anything with a necrotic surface. Since then he has been using Santyl The patient is now at home. He was on vancomycin and cefepime. He developed hives and a generalized rash and was seen in the ER on 3/15 by Dr. Cain [name] recommendation he was changed to meropenem however he also apparently developed hives after this. He says the original hives stopped after the cefepime was stopped and follow-up hives only developed after the meropenem. He is only taken 1 dose of the fear of the meropenem and now is on vancomycin. 3/25; the patient was seen by Dr. Linus [name] on 3/22. Because of intolerances to meropenem which I communicated with Dr.  De [name] about he is now on daptomycin and Cipro until sometime in early April. He seems to be tolerating the antibiotics well. We are using Santyl back by wet-to-dry dressings. And making some improvement in the general surface of this very large wound 4/8; we are using Santyl back by wet-to-dry. We are making  progress in terms of prepping the wound for a wound VAC. Still necrotic material requiring debridement. 4/16; we are using Santyl back by wet-to-dry. Not convinced that his wife is packing this properly there is a 6 cm tunnel at 12:00. We brought this to attention. Still requiring mechanical debridement Objective Constitutional Sitting or standing Blood Pressure is within target range for patient.. Pulse regular and within target range for patient.Marland Kitchen Respirations regular, non-labored and within target range.. Temperature is normal and within the target range for the patient.Marland Kitchen Appears in no distress. Vitals Time Taken: 1:55 PM, Height: 70 in, Weight: 198 lbs, BMI: 28.4, Temperature: 97.8 F, Pulse: 66 bpm, Respiratory Rate: 19 breaths/min, Blood Pressure: 130/61 mmHg. General Notes: Wound exam; the area in question is on the left greater trochanter. Deep tunneling area at 12:00 approximately 6 cm. There is still necrotic tissue although considerably less than what we had vigorous debridement with a #5 curette removing necrotic tissue probably muscle down to the depth of this. I did not appreciate any exposed bone which is a major improvement. No evidence of surrounding infection Integumentary (Hair, Skin) Wound #1 status is Open. Original cause of wound was Surgical Injury. The wound is located on the Left Trochanter. The wound measures 3.8cm length x 4cm width x 3.2cm depth; 11.938cm^2 area and 38.202cm^3 volume. There is muscle and Fat Layer (Subcutaneous Tissue) Exposed exposed. There is no tunneling noted, however, there is undermining starting at 9:00 and ending at 12:00 with a maximum  distance of 5.9cm. There is a large amount of serous drainage noted. The wound margin is well defined and not attached to the wound base. There is medium (34-66%) pink granulation within the wound bed. There is a medium (34-66%) amount of necrotic tissue within the wound bed including Adherent Slough. Assessment Active Problems ICD-10 Pressure ulcer of left hip, stage 4 Disruption of external operation (surgical) wound, not elsewhere classified, subsequent encounter Procedures Wound #1 Pre-procedure diagnosis of Wound #1 is a Pressure Ulcer located on the Left Trochanter . There was a Excisional Skin/Subcutaneous Tissue/Muscle Debridement with a total area of 15.2 sq cm performed by Ivan [name]., MD. With the following instrument(s): Curette to remove Viable and Non-Viable tissue/material. Material removed includes Muscle, Subcutaneous Tissue, Slough, Skin: Dermis, and Fibrin/Exudate after achieving pain control using Lidocaine 4% T opical Solution. A time out was conducted at 14:10, prior to the start of the procedure. A Minimum amount of bleeding was controlled with Pressure. The procedure was tolerated well with a pain level of 0 throughout and a pain level of 0 following the procedure. Post Debridement Measurements: 3.8cm length x 4cm width x 3.2cm depth; 38.202cm^3 volume. Post debridement Stage noted as Category/Stage IV. Character of Wound/Ulcer Post Debridement requires further debridement. Post procedure Diagnosis Wound #1: Same as Pre-Procedure Plan Follow-up Appointments: Return Appointment in 2 weeks. - Thursday Dressing Change Frequency: Change dressing every day. Skin Barriers/Peri-Wound Care: Skin Prep Wound Cleansing: Clean wound with Wound Cleanser Primary Wound Dressing: Wound #1 Left Trochanter: Santyl Ointment - backed with saline moisten gauze. Secondary Dressing: Foam Border - or bordered gauze. may use extra tape to secure dressing in place as  needed. Off-Loading: Other: - stay off of left hip to aid in offloading pressure. 1. The wound was vigorously cleaned with wound cleanser and then debridement with a #5 curette getting as much necrotic muscle as I can removed from the tunnel area. Also some subcutaneous debris. I did not appreciate bone which is gratifying 2.  Continue with the Santyl wet-to-dry 3. I am hopeful to begin a wound VAC within the next 2 weeks Electronic Signature(s) Signed: 08/28/2019 6:55:41 AM By: Ivan Ham MD Entered By: Ivan [name] on 08/26/2019 14:41:57 -------------------------------------------------------------------------------- SuperBill Details Patient Name: Date of Service: Ivan [name], Ivan Ivan W. 08/26/2019 Medical Record Number: [id number] Patient Account Number: [id number] Date of Birth/Sex: Treating RN: [birth year] (74 y.o. Ivan [name] Other Clinician: Referring Provider: Treating Provider/Extender: Ivan [name], Ivan [name] in Treatment: 4 Diagnosis Coding ICD-10 Codes Code Description [phone] Pressure ulcer of left hip, stage 4 T81.31XD Disruption of external operation (surgical) wound, not elsewhere classified, subsequent encounter Facility Procedures The patient participates with Medicare or their insurance follows the Medicare Facility Guidelines: CPT4 Code Description Modifier Quantity [id number] - DEB MUSC/FASCIA 20 SQ CM/< 1 ICD-10 Diagnosis Description L89.224 Pressure ulcer of left hip,  stage 4 Physician Procedures : CPT4 Code Description Modifier [id number] - WC PHYS DEBR MUSCLE/FASCIA 20 SQ CM ICD-10 Diagnosis Description L89.224 Pressure ulcer of left hip, stage 4 Quantity: 1 Electronic Signature(s) Signed: 08/28/2019 6:55:41 AM By: Ivan Ham MD Entered By: Ivan [name] on 08/26/2019 14:42:09

## 2019-10-04 NOTE — Telephone Encounter (Signed)
Patient calls nurse line requesting a referral to vascular surgery. Patient is requesting to see Dr. Harold Barban at [facility]. Per patient he needs an angioplasty due to blockage in both legs. Patient reports it is getting hard to ambulate and he fell a few nights ago. Patient reports a "Band-Aid" size cut on his head. Patient denies headaches, excessive sleepiness, or change in pupils. Patient does not feel he needs to be seen for this, just wanted fall noted. Will forward to PCP.

## 2019-10-04 NOTE — Progress Notes (Signed)
KIEREN, ADKISON ([id number]) Visit Report for 09/09/2019 Arrival Information Details Patient Name: Date of Service: Ivan [name]. 09/09/2019 1:30 PM Medical Record Number: [id number] Patient Account Number: [id number] Date of Birth/Sex: Treating RN: July 11, 1945 (74 y.o. Lorette Ang, Meta.Reding Primary Care Quint Chestnut: [name] Other Clinician: Referring Suella Cogar: Treating Eraina Winnie/Extender: Ronnie Derby, [name] in Treatment: 6 Visit Information History Since Last Visit Added or deleted any medications: No Patient Arrived: Kasandra Knudsen Any new allergies or adverse reactions: No Arrival Time: 14:30 Had a fall or experienced change in No Accompanied By: self activities of daily living that may affect Transfer Assistance: None risk of falls: Patient Identification Verified: Yes Signs or symptoms of abuse/neglect since last visito No Secondary Verification Process Completed: Yes Hospitalized since last visit: No Patient Requires Transmission-Based Precautions: No Implantable device outside of the clinic excluding No Patient Has Alerts: No cellular tissue based products placed in the center since last visit: Has Dressing in Place as Prescribed: Yes Pain Present Now: Yes Electronic Signature(s) Signed: 09/22/2019 9:09:16 AM By: Sandre Kitty Entered By: Sandre Kitty on 09/09/2019 14:30:25 -------------------------------------------------------------------------------- Clinic Level of Care Assessment Details Patient Name: Date of Service: Ivan [name]. 09/09/2019 1:30 PM Medical Record Number: [id number] Patient Account Number: [id number] Date of Birth/Sex: Treating RN: Sep 10, 1945 (74 y.o. [name] Primary Care Lewellyn Fultz: [name] Other Clinician: Referring Adiya Selmer: Treating Kaytlan Behrman/Extender: Ronnie Derby, [name] in Treatment: 6 Clinic Level of Care Assessment Items TOOL 4 Quantity Score X- 1 0 Use when only an EandM is  performed on FOLLOW-UP visit ASSESSMENTS - Nursing Assessment / Reassessment X- 1 10 Reassessment of Co-morbidities (includes updates in patient status) X- 1 5 Reassessment of Adherence to Treatment Plan ASSESSMENTS - Wound and Skin A ssessment / Reassessment X - Simple Wound Assessment / Reassessment - one wound 1 5 []  - 0 Complex Wound Assessment / Reassessment - multiple wounds X- 1 10 Dermatologic / Skin Assessment (not related to wound area) ASSESSMENTS - Focused Assessment []  - 0 Circumferential Edema Measurements - multi extremities X- 1 10 Nutritional Assessment / Counseling / Intervention []  - 0 Lower Extremity Assessment (monofilament, tuning fork, pulses) []  - 0 Peripheral Arterial Disease Assessment (using hand held doppler) ASSESSMENTS - Ostomy and/or Continence Assessment and Care []  - 0 Incontinence Assessment and Management []  - 0 Ostomy Care Assessment and Management (repouching, etc.) PROCESS - Coordination of Care X - Simple Patient / Family Education for ongoing care 1 15 []  - 0 Complex (extensive) Patient / Family Education for ongoing care X- 1 10 Staff obtains Consents, Records, T Results / Process Orders est X- 1 10 Staff telephones HHA, Nursing Homes / Clarify orders / etc []  - 0 Routine Transfer to another Facility (non-emergent condition) []  - 0 Routine Hospital Admission (non-emergent condition) []  - 0 New Admissions / Biomedical engineer / Ordering NPWT Apligraf, etc. , []  - 0 Emergency Hospital Admission (emergent condition) X- 1 10 Simple Discharge Coordination []  - 0 Complex (extensive) Discharge Coordination PROCESS - Special Needs []  - 0 Pediatric / Minor Patient Management []  - 0 Isolation Patient Management []  - 0 Hearing / Language / Visual special needs []  - 0 Assessment of Community assistance (transportation, D/C planning, etc.) []  - 0 Additional assistance / Altered mentation []  - 0 Support Surface(s) Assessment  (bed, cushion, seat, etc.) INTERVENTIONS - Wound Cleansing / Measurement X - Simple Wound Cleansing - one wound 1 5 []  - 0 Complex Wound Cleansing - multiple wounds  X- 1 5 Wound Imaging (photographs - any number of wounds) []  - 0 Wound Tracing (instead of photographs) X- 1 5 Simple Wound Measurement - one wound []  - 0 Complex Wound Measurement - multiple wounds INTERVENTIONS - Wound Dressings []  - 0 Small Wound Dressing one or multiple wounds X- 1 15 Medium Wound Dressing one or multiple wounds []  - 0 Large Wound Dressing one or multiple wounds []  - 0 Application of Medications - topical []  - 0 Application of Medications - injection INTERVENTIONS - Miscellaneous []  - 0 External ear exam []  - 0 Specimen Collection (cultures, biopsies, blood, body fluids, etc.) []  - 0 Specimen(s) / Culture(s) sent or taken to Lab for analysis []  - 0 Patient Transfer (multiple staff / Civil Service fast streamer / Similar devices) []  - 0 Simple Staple / Suture removal (25 or less) []  - 0 Complex Staple / Suture removal (26 or more) []  - 0 Hypo / Hyperglycemic Management (close monitor of Blood Glucose) []  - 0 Ankle / Brachial Index (ABI) - do not check if billed separately X- 1 5 Vital Signs Has the patient been seen at the hospital within the last three years: Yes Total Score: 120 Level Of Care: New/Established - Level 4 Electronic Signature(s) Signed: 09/09/2019 5:33:37 PM By: [name] Entered By: [name] on 09/09/2019 17:00:34 -------------------------------------------------------------------------------- Encounter Discharge Information Details Patient Name: Date of Service: Ivan [name], Ivan BERT W. 09/09/2019 1:30 PM Medical Record Number: [id number] Patient Account Number: [id number] Date of Birth/Sex: Treating RN: 07-02-1945 (74 y.o. [name] Primary Care Markela Wee: [name] Other Clinician: Referring Stephonie Wilcoxen: Treating Tanesia Butner/Extender: Fonnie Jarvis in Treatment: 6 Encounter Discharge Information Items Discharge Condition: Stable Ambulatory Status: Cane Discharge Destination: Home Transportation: Private Auto Accompanied By: self Schedule Follow-up Appointment: Yes Clinical Summary of Care: Patient Declined Electronic Signature(s) Signed: 09/09/2019 5:04:41 PM By: Baruch Gouty RN, BSN Entered By: Baruch Gouty on 09/09/2019 15:04:11 -------------------------------------------------------------------------------- Lower Extremity Assessment Details Patient Name: Date of Service: Ivan [name], Ivan BERT W. 09/09/2019 1:30 PM Medical Record Number: [id number] Patient Account Number: [id number] Date of Birth/Sex: Treating RN: 05/10/1946 (74 y.o. [name] Primary Care Brandilee Pies: [name] Other Clinician: Referring Fredrich Cory: Treating Maryela Tapper/Extender: Ronnie Derby, [name] in Treatment: 6 Electronic Signature(s) Signed: 09/09/2019 5:33:37 PM By: [name] Signed: 10/04/2019 1:30:32 PM By: [name] Entered By: [name] on 09/09/2019 14:38:02 -------------------------------------------------------------------------------- Multi Wound Chart Details Patient Name: Date of Service: Ivan [name], Ivan BERT W. 09/09/2019 1:30 PM Medical Record Number: [id number] Patient Account Number: [id number] Date of Birth/Sex: Treating RN: 1946-05-09 (74 y.o. [name] Primary Care Jules Vidovich: [name] Other Clinician: Referring Ruhani Umland: Treating Brandi Tomlinson/Extender: Ronnie Derby, [name] in Treatment: 6 Vital Signs Height(in): 70 Pulse(bpm): 9 Weight(lbs): 198 Blood Pressure(mmHg): 137/51 Body Mass Index(BMI): 28 Temperature(F): 98.3 Respiratory Rate(breaths/min): 19 Photos: [1:No Photos Left Trochanter] [N/A:N/A N/A] Wound Location: [1:Surgical Injury] [N/A:N/A] Wounding Event: [1:Pressure Ulcer] [N/A:N/A] Primary Etiology: [1:Open Surgical Wound] [N/A:N/A] Secondary  Etiology: [1:Cataracts, Coronary Artery Disease,] [N/A:N/A] Comorbid History: [1:Hypertension, Myocardial Infarction, Osteoarthritis, Osteomyelitis 07/01/2019] [N/A:N/A] Date Acquired: [1:6] [N/A:N/A] Weeks of Treatment: [1:Open] [N/A:N/A] Wound Status: [1:4x3.5x3.8] [N/A:N/A] Measurements L x W x D (cm) [1:10.996] [N/A:N/A] A (cm) : rea [1:41.783] [N/A:N/A] Volume (cm) : [1:49.00%] [N/A:N/A] % Reduction in A rea: [1:-38.40%] [N/A:N/A] % Reduction in Volume: [1:9] Starting Position 1 (o'clock): [1:2] Ending Position 1 (o'clock): [1:5.5] Maximum Distance 1 (cm): [1:Yes] [N/A:N/A] Undermining: [1:Category/Stage IV] [N/A:N/A] Classification: [1:Large] [N/A:N/A] Exudate A mount: [1:Serous] [N/A:N/A] Exudate  Type: [1:amber] [N/A:N/A] Exudate Color: [1:Well defined, not attached] [N/A:N/A] Wound Margin: [1:Large (67-100%)] [N/A:N/A] Granulation A mount: [1:Pink] [N/A:N/A] Granulation Quality: [1:Small (1-33%)] [N/A:N/A] Necrotic A mount: [1:Fat Layer (Subcutaneous Tissue)] [N/A:N/A] Exposed Structures: [1:Exposed: Yes Muscle: Yes Fascia: No Tendon: No Joint: No Bone: No None] [N/A:N/A] Treatment Notes Electronic Signature(s) Signed: 09/09/2019 5:41:26 PM By: [name] MD Signed: 10/04/2019 1:30:32 PM By: [name] Entered By: [name] on 09/09/2019 14:50:31 -------------------------------------------------------------------------------- Multi-Disciplinary Care Plan Details Patient Name: Date of Service: Ivan [name], Ivan BERT W. 09/09/2019 1:30 PM Medical Record Number: [id number] Patient Account Number: [id number] Date of Birth/Sex: Treating RN: 10-23-1945 (74 y.o. [name] Primary Care Lilyanna Lunt: [name] Other Clinician: Referring Dvaughn Fickle: Treating Vaughn Frieze/Extender: Ronnie Derby, [name] in Treatment: 6 Active Inactive Nutrition Nursing Diagnoses: Potential for alteratiion in Nutrition/Potential for imbalanced  nutrition Goals: Patient/caregiver agrees to and verbalizes understanding of need to obtain nutritional consultation Date Initiated: 07/29/2019 Target Resolution Date: 09/24/2019 Goal Status: Active Interventions: Provide education on nutrition Treatment Activities: Education provided on Nutrition : 08/26/2019 Patient referred to Primary Care Physician for further nutritional evaluation : 07/29/2019 Notes: Pain, Acute or Chronic Nursing Diagnoses: Pain, acute or chronic: actual or potential Potential alteration in comfort, pain Goals: Patient will verbalize adequate pain control and receive pain control interventions during procedures as needed Date Initiated: 07/29/2019 Target Resolution Date: 10/01/2019 Goal Status: Active Patient/caregiver will verbalize comfort level met Date Initiated: 07/29/2019 Date Inactivated: 08/19/2019 Target Resolution Date: 08/20/2019 Goal Status: Met Interventions: Encourage patient to take pain medications as prescribed Provide education on pain management Reposition patient for comfort Treatment Activities: Administer pain control measures as ordered : 07/29/2019 Notes: Electronic Signature(s) Signed: 09/09/2019 5:33:37 PM By: [name] Signed: 10/04/2019 1:30:32 PM By: [name] Entered By: [name] on 09/09/2019 14:32:24 -------------------------------------------------------------------------------- Pain Assessment Details Patient Name: Date of Service: Ivan [name], Ivan BERT W. 09/09/2019 1:30 PM Medical Record Number: [id number] Patient Account Number: [id number] Date of Birth/Sex: Treating RN: 1945/10/10 (74 y.o. [name] Primary Care Jadrian Bulman: [name] Other Clinician: Referring Mammie Meras: Treating Yzabella Crunk/Extender: Fonnie Jarvis in Treatment: 6 Active Problems Location of Pain Severity and Description of Pain Patient Has Paino Yes Site Locations Rate the pain. Rate the pain. Current Pain  Level: 4 Pain Management and Medication Current Pain Management: Electronic Signature(s) Signed: 09/22/2019 9:09:16 AM By: Sandre Kitty Signed: 10/04/2019 1:30:32 PM By: [name] Entered By: Sandre Kitty on 09/09/2019 14:32:41 -------------------------------------------------------------------------------- Patient/Caregiver Education Details Patient Name: Date of Service: Ivan [name] 4/29/2021andnbsp1:30 PM Medical Record Number: [id number] Patient Account Number: [id number] Date of Birth/Gender: Treating RN: 10-Apr-1946 (74 y.o. [name] Primary Care Physician: [name] Other Clinician: Referring Physician: Treating Physician/Extender: Fonnie Jarvis in Treatment: 6 Education Assessment Education Provided To: Patient Education Topics Provided Nutrition: Handouts: Nutrition Methods: Explain/Verbal Responses: Reinforcements needed Electronic Signature(s) Signed: 09/09/2019 5:33:37 PM By: [name] Entered By: [name] on 09/09/2019 14:32:36 -------------------------------------------------------------------------------- Wound Assessment Details Patient Name: Date of Service: Ivan [name], Ivan BERT W. 09/09/2019 1:30 PM Medical Record Number: [id number] Patient Account Number: [id number] Date of Birth/Sex: Treating RN: [birth year] (74 y.o. [name] Primary Care Monica Zahler: [name] Other Clinician: Referring Lael Wetherbee: Treating Margarete [name]/Extender: Ronnie Derby, [name] in Treatment: 6 Wound Status Wound Number: 1 Primary Pressure Ulcer Etiology: Wound Location: Left Trochanter Secondary Open Surgical Wound Wounding Event: Surgical Injury Etiology: Date Acquired: 07/01/2019 Wound Open Weeks Of Treatment: 6 Status: Clustered Wound: No Comorbid Cataracts,  Coronary Artery Disease, Hypertension, Myocardial History: Infarction, Osteoarthritis, Osteomyelitis Photos Photo Uploaded By: Mikeal Hawthorne on 09/10/2019 14:51:15 Wound Measurements Length: (cm) 4 Width: (cm) 3.5 Depth: (cm) 3.8 Area: (cm) 10.996 Volume: (cm) 41.783 % Reduction in Area: 49% % Reduction in Volume: -38.4% Epithelialization: None Tunneling: No Undermining: Yes Starting Position (o'clock): 9 Ending Position (o'clock): 2 Maximum Distance: (cm) 5.5 Wound Description Classification: Category/Stage IV Wound Margin: Well defined, not attached Exudate Amount: Large Exudate Type: Serous Exudate Color: amber Foul Odor After Cleansing: No Slough/Fibrino Yes Wound Bed Granulation Amount: Large (67-100%) Exposed Structure Granulation Quality: Pink Fascia Exposed: No Necrotic Amount: Small (1-33%) Fat Layer (Subcutaneous Tissue) Exposed: Yes Necrotic Quality: Adherent Slough Tendon Exposed: No Muscle Exposed: Yes Necrosis of Muscle: No Joint Exposed: No Bone Exposed: No Electronic Signature(s) Signed: 09/09/2019 5:33:37 PM By: [name] Signed: 10/04/2019 1:30:32 PM By: [name] Entered By: [name] on 09/09/2019 14:40:00 -------------------------------------------------------------------------------- Vitals Details Patient Name: Date of Service: Ivan [name], Ivan BERT W. 09/09/2019 1:30 PM Medical Record Number: [id number] Patient Account Number: [id number] Date of Birth/Sex: Treating RN: Jul 24, 1945 (74 y.o. Lorette Ang, Meta.Reding Primary Care Winton Offord: Other Clinician: [name] Referring Leander Tout: Treating Meyli Boice/Extender: Ronnie Derby, [name] in Treatment: 6 Vital Signs Time Taken: 14:30 Temperature (F): 98.3 Height (in): 70 Pulse (bpm): 57 Weight (lbs): 198 Respiratory Rate (breaths/min): 19 Body Mass Index (BMI): 28.4 Blood Pressure (mmHg): 137/51 Reference Range: 80 - 120 mg / dl Electronic Signature(s) Signed: 09/22/2019 9:09:16 AM By: Sandre Kitty Entered By: Sandre Kitty on 09/09/2019 14:32:33

## 2019-10-04 NOTE — Progress Notes (Signed)
KEENEN, ROESSNER ([id number]) Visit Report for 08/26/2019 Arrival Information Details Patient Name: Date of Service: Ivan [name]. 08/26/2019 1:45 PM Medical Record Number: [id number] Patient Account Number: [id number] Date of Birth/Sex: Treating RN: 01/02/1946 (74 y.o. [name] Primary Care Vernal Rutan: [name] Other Clinician: Referring Savita Runner: Treating Zedrick Springsteen/Extender: Fonnie Jarvis in Treatment: 4 Visit Information History Since Last Visit Added or deleted any medications: No Patient Arrived: Kasandra Knudsen Any new allergies or adverse reactions: No Arrival Time: 13:55 Had a fall or experienced change in No Accompanied By: self activities of daily living that may affect Transfer Assistance: None risk of falls: Patient Identification Verified: Yes Signs or symptoms of abuse/neglect since last visito No Secondary Verification Process Completed: Yes Hospitalized since last visit: No Patient Requires Transmission-Based Precautions: No Implantable device outside of the clinic excluding No Patient Has Alerts: No cellular tissue based products placed in the center since last visit: Has Dressing in Place as Prescribed: Yes Pain Present Now: No Electronic Signature(s) Signed: 08/27/2019 4:21:34 PM By: Kela Millin Entered By: Kela Millin on 08/26/2019 13:56:09 -------------------------------------------------------------------------------- Encounter Discharge Information Details Patient Name: Date of Service: [name], [name] 08/26/2019 1:45 PM Medical Record Number: [id number] Patient Account Number: [id number] Date of Birth/Sex: Treating RN: 10-10-1945 (74 y.o. [name] Primary Care Florine Sprenkle: [name] Other Clinician: Referring Iver Miklas: Treating Aalaiyah Yassin/Extender: Fonnie Jarvis in Treatment: 4 Encounter Discharge Information Items Post Procedure Vitals Discharge Condition:  Stable Temperature (F): 97.8 Ambulatory Status: Cane Pulse (bpm): 66 Discharge Destination: Home Respiratory Rate (breaths/min): 18 Transportation: Private Auto Blood Pressure (mmHg): 130/61 Accompanied By: self Schedule Follow-up Appointment: Yes Clinical Summary of Care: Patient Declined Electronic Signature(s) Signed: 08/26/2019 4:28:12 PM By: Baruch Gouty RN, BSN Entered By: Baruch Gouty on 08/26/2019 14:33:47 -------------------------------------------------------------------------------- Lower Extremity Assessment Details Patient Name: Date of Service: [name], [name] 08/26/2019 1:45 PM Medical Record Number: [id number] Patient Account Number: [id number] Date of Birth/Sex: Treating RN: Apr 23, 1946 (74 y.o. [name] Primary Care Maddix Kliewer: [name] Other Clinician: Referring Gar Glance: Treating Kaylem Gidney/Extender: Ronnie Derby, [name] in Treatment: 4 Electronic Signature(s) Signed: 08/27/2019 4:21:34 PM By: Kela Millin Entered By: Kela Millin on 08/26/2019 13:56:43 -------------------------------------------------------------------------------- Multi Wound Chart Details Patient Name: Date of Service: [name], [name] 08/26/2019 1:45 PM Medical Record Number: [id number] Patient Account Number: [id number] Date of Birth/Sex: Treating RN: 1945/09/20 (74 y.o. [name] Primary Care Melora Menon: [name] Other Clinician: Referring Haniah Penny: Treating Berda Shelvin/Extender: Ronnie Derby, [name] in Treatment: 4 Vital Signs Height(in): 70 Pulse(bpm): 61 Weight(lbs): 198 Blood Pressure(mmHg): 130/61 Body Mass Index(BMI): 28 Temperature(F): 97.8 Respiratory Rate(breaths/min): 19 Photos: [1:No Photos Left Trochanter] [N/A:N/A N/A] Wound Location: [1:Surgical Injury] [N/A:N/A] Wounding Event: [1:Pressure Ulcer] [N/A:N/A] Primary Etiology: [1:Open Surgical Wound] [N/A:N/A] Secondary Etiology:  [1:Cataracts, Coronary Artery Disease,] [N/A:N/A] Comorbid History: [1:Hypertension, Myocardial Infarction, Osteoarthritis, Osteomyelitis 07/01/2019] [N/A:N/A] Date Acquired: [1:4] [N/A:N/A] Weeks of Treatment: [1:Open] [N/A:N/A] Wound Status: [1:3.8x4x3.2] [N/A:N/A] Measurements L x W x D (cm) [1:11.938] [N/A:N/A] A (cm) : rea [1:38.202] [N/A:N/A] Volume (cm) : [1:44.60%] [N/A:N/A] % Reduction in A rea: [1:-26.60%] [N/A:N/A] % Reduction in Volume: [1:9] Starting Position 1 (o'clock): [1:12] Ending Position 1 (o'clock): [1:5.9] Maximum Distance 1 (cm): [1:Yes] [N/A:N/A] Undermining: [1:Category/Stage IV] [N/A:N/A] Classification: [1:Large] [N/A:N/A] Exudate A mount: [1:Serous] [N/A:N/A] Exudate Type: [1:amber] [N/A:N/A] Exudate Color: [1:Well defined, not attached] [N/A:N/A] Wound Margin: [1:Medium (34-66%)] [N/A:N/A] Granulation A mount: [1:Pink] [N/A:N/A] Granulation Quality: [1:Medium (34-66%)] [N/A:N/A] Necrotic A mount: [  1:Fat Layer (Subcutaneous Tissue)] [N/A:N/A] Exposed Structures: [1:Exposed: Yes Muscle: Yes Fascia: No Tendon: No Joint: No Bone: No None] [N/A:N/A] Epithelialization: [1:Debridement - Excisional] [N/A:N/A] Debridement: Pre-procedure Verification/Time Out 14:10 [N/A:N/A] Taken: [1:Lidocaine 4% Topical Solution] [N/A:N/A] Pain Control: [1:Muscle, Subcutaneous, Slough] [N/A:N/A] Tissue Debrided: [1:Skin/Subcutaneous Tissue/Muscle] [N/A:N/A] Level: [1:15.2] [N/A:N/A] Debridement A (sq cm): [1:rea Curette] [N/A:N/A] Instrument: [1:Minimum] [N/A:N/A] Bleeding: [1:Pressure] [N/A:N/A] Hemostasis A chieved: [1:0] [N/A:N/A] Procedural Pain: [1:0] [N/A:N/A] Post Procedural Pain: [1:Procedure was tolerated well] [N/A:N/A] Debridement Treatment Response: [1:3.8x4x3.2] [N/A:N/A] Post Debridement Measurements L x W x D (cm) [1:38.202] [N/A:N/A] Post Debridement Volume: (cm) [1:Category/Stage IV] [N/A:N/A] Post Debridement Stage: [1:Debridement]  [N/A:N/A] Treatment Notes Wound #1 (Left Trochanter) 2. Periwound Care Skin Prep 3. Primary Dressing Applied Santyl Other primary dressing (specifiy in notes) 4. Secondary Dressing Foam Border Dressing 5. Secured With Tape Notes saline moistened gauze packing Electronic Signature(s) Signed: 08/28/2019 6:55:41 AM By: [name] MD Signed: 10/04/2019 1:31:05 PM By: [name] Entered By: [name] on 08/26/2019 14:39:04 -------------------------------------------------------------------------------- Multi-Disciplinary Care Plan Details Patient Name: Date of Service: [name], [name] 08/26/2019 1:45 PM Medical Record Number: [id number] Patient Account Number: [id number] Date of Birth/Sex: Treating RN: [birth year] (74 y.o. [name] Primary Care Caedyn Raygoza: [name] Other Clinician: Referring Mauri Temkin: Treating Analilia Geddis/Extender: Ronnie Derby, [name] in Treatment: 4 Active Inactive Nutrition Nursing Diagnoses: Potential for alteratiion in Nutrition/Potential for imbalanced nutrition Goals: Patient/caregiver agrees to and verbalizes understanding of need to obtain nutritional consultation Date Initiated: 07/29/2019 Target Resolution Date: 09/10/2019 Goal Status: Active Interventions: Provide education on nutrition Treatment Activities: Education provided on Nutrition : 08/19/2019 Patient referred to Primary Care Physician for further nutritional evaluation : 07/29/2019 Notes: Pain, Acute or Chronic Nursing Diagnoses: Pain, acute or chronic: actual or potential Potential alteration in comfort, pain Goals: Patient will verbalize adequate pain control and receive pain control interventions during procedures as needed Date Initiated: 07/29/2019 Target Resolution Date: 09/10/2019 Goal Status: Active Patient/caregiver will verbalize comfort level met Date Initiated: 07/29/2019 Date Inactivated: 08/19/2019 Target Resolution Date:  08/20/2019 Goal Status: Met Interventions: Encourage patient to take pain medications as prescribed Provide education on pain management Reposition patient for comfort Treatment Activities: Administer pain control measures as ordered : 07/29/2019 Notes: Electronic Signature(s) Signed: 08/26/2019 4:13:32 PM By: [name] Signed: 10/04/2019 1:31:05 PM By: [name] Entered By: [name] on 08/26/2019 15:30:55 -------------------------------------------------------------------------------- Pain Assessment Details Patient Name: Date of Service: [name], [name] 08/26/2019 1:45 PM Medical Record Number: [id number] Patient Account Number: [id number] Date of Birth/Sex: Treating RN: 07-18-1945 (74 y.o. [name] Primary Care Daniil Labarge: [name] Other Clinician: Referring Dylen Mcelhannon: Treating Madsen Riddle/Extender: Ronnie Derby, [name] in Treatment: 4 Active Problems Location of Pain Severity and Description of Pain Patient Has Paino No Site Locations Pain Management and Medication Current Pain Management: Electronic Signature(s) Signed: 08/27/2019 4:21:34 PM By: Kela Millin Entered By: Kela Millin on 08/26/2019 13:56:35 -------------------------------------------------------------------------------- Patient/Caregiver Education Details Patient Name: Date of Service: Ivan [name] 4/15/2021andnbsp1:45 PM Medical Record Number: [id number] Patient Account Number: [id number] Date of Birth/Gender: Treating RN: July 09, 1945 (74 y.o. [name] Primary Care Physician: [name] Other Clinician: Referring Physician: Treating Physician/Extender: Fonnie Jarvis in Treatment: 4 Education Assessment Education Provided To: Patient Education Topics Provided Nutrition: Handouts: Nutrition Methods: Explain/Verbal Responses: Reinforcements needed Electronic Signature(s) Signed: 08/26/2019 4:13:32 PM By:  [name] Entered By: [name] on 08/26/2019 15:31:07 -------------------------------------------------------------------------------- Wound Assessment Details Patient Name: Date of Service: [name], RO  BERT W. 08/26/2019 1:45 PM Medical Record Number: [id number] Patient Account Number: [id number] Date of Birth/Sex: Treating RN: [birth year] (74 y.o. [name] Primary Care Madelin Weseman: [name] Other Clinician: Referring Kajah Santizo: Treating Suprina Mandeville/Extender: Ronnie Derby, [name] in Treatment: 4 Wound Status Wound Number: 1 Primary Pressure Ulcer Etiology: Wound Location: Left Trochanter Secondary Open Surgical Wound Wounding Event: Surgical Injury Etiology: Date Acquired: 07/01/2019 Wound Open Weeks Of Treatment: 4 Status: Clustered Wound: No Comorbid Cataracts, Coronary Artery Disease, Hypertension, Myocardial History: Infarction, Osteoarthritis, Osteomyelitis Wound Measurements Length: (cm) 3.8 Width: (cm) 4 Depth: (cm) 3.2 Area: (cm) 11.938 Volume: (cm) 38.202 % Reduction in Area: 44.6% % Reduction in Volume: -26.6% Epithelialization: None Tunneling: No Undermining: Yes Starting Position (o'clock): 9 Ending Position (o'clock): 12 Maximum Distance: (cm) 5.9 Wound Description Classification: Category/Stage IV Wound Margin: Well defined, not attached Exudate Amount: Large Exudate Type: Serous Exudate Color: amber Foul Odor After Cleansing: No Slough/Fibrino Yes Wound Bed Granulation Amount: Medium (34-66%) Exposed Structure Granulation Quality: Pink Fascia Exposed: No Necrotic Amount: Medium (34-66%) Fat Layer (Subcutaneous Tissue) Exposed: Yes Necrotic Quality: Adherent Slough Tendon Exposed: No Muscle Exposed: Yes Necrosis of Muscle: No Joint Exposed: No Bone Exposed: No Electronic Signature(s) Signed: 08/27/2019 4:21:34 PM By: Kela Millin Entered By: Kela Millin on 08/26/2019  14:02:25 -------------------------------------------------------------------------------- Vitals Details Patient Name: Date of Service: [name], [name] 08/26/2019 1:45 PM Medical Record Number: [id number] Patient Account Number: [id number] Date of Birth/Sex: Treating RN: [birth year] (74 y.o. [name] Primary Care Icelynn Onken: [name] Other Clinician: Referring Aaidyn San: Treating Lillias Difrancesco/Extender: Ronnie Derby, [name] in Treatment: 4 Vital Signs Time Taken: 13:55 Temperature (F): 97.8 Height (in): 70 Pulse (bpm): 66 Weight (lbs): 198 Respiratory Rate (breaths/min): 19 Body Mass Index (BMI): 28.4 Blood Pressure (mmHg): 130/61 Reference Range: 80 - 120 mg / dl Electronic Signature(s) Signed: 08/27/2019 4:21:34 PM By: Kela Millin Entered By: Kela Millin on 08/26/2019 13:56:30

## 2019-10-05 ENCOUNTER — Telehealth: Payer: Self-pay | Admitting: Nurse Practitioner

## 2019-10-05 MED ORDER — CARVEDILOL 12.5 MG PO TABS: 12.5000 mg | ORAL_TABLET | Freq: Two times a day (BID) | ORAL | 3 refills | Status: DC

## 2019-10-05 NOTE — Telephone Encounter (Signed)
Called patient in regards to his MyChart request to get something to replace bystolic due to cost. I advised that per Dr. Acie Fredrickson he may d/c Bystolic and start carvedilol 12.5 mg twice daily. He verbalized understanding and agreement and thanked me for the quick response.

## 2019-10-14 ENCOUNTER — Encounter (HOSPITAL_BASED_OUTPATIENT_CLINIC_OR_DEPARTMENT_OTHER): Payer: Medicare HMO | Admitting: Internal Medicine

## 2019-10-15 ENCOUNTER — Telehealth: Payer: Self-pay | Admitting: Family Medicine

## 2019-10-15 NOTE — Telephone Encounter (Signed)
Long term disability  form dropped off for at front desk for completion.  Verified that patient section of form has been completed.  Last DOS/WCC with PCP was 09/10/19.  Placed form in team folder to be completed by clinical staff.  Grayce Corie Chiquito

## 2019-10-18 NOTE — Telephone Encounter (Signed)
Long term disability form reviewed and placed in PCP's box for completion.  Ozella Almond, [place]

## 2019-10-21 NOTE — Telephone Encounter (Signed)
Attempted to make appointment for patient per Dr. Owens Shark.  Now answer.  LVM again asking patient to call [facility] to schedule appointment.  Ozella Almond, [place]

## 2019-10-21 NOTE — Telephone Encounter (Signed)
Called and left voicemail for patient.  Per form, needs office visit---okay for virtual or in person. Left voicemail for patient about this. Red team- can you please help schedule appointment?  [name], MD  Family Medicine Teaching Service

## 2019-10-22 NOTE — Telephone Encounter (Signed)
Attempted to call---- okay to double book on 6/25 at 1130 AM with me for virtual visit if patient prefers this. I don't think a resident physician can sign form.   Thanks, [name], MD  [facility] Medicine Teaching Service

## 2019-10-22 NOTE — Telephone Encounter (Signed)
Patient called back to schedule appointment. Dr. Owens Shark does not have anything until 07/16 and that was to late for him to have forms completed. I have scheduled him with Dr. Garlan Fillers on 10/28/19.

## 2019-10-26 ENCOUNTER — Ambulatory Visit: Payer: PRIVATE HEALTH INSURANCE | Admitting: Podiatry

## 2019-10-28 ENCOUNTER — Ambulatory Visit: Payer: Medicare HMO | Admitting: Family Medicine

## 2019-11-04 ENCOUNTER — Other Ambulatory Visit: Payer: Self-pay

## 2019-11-04 ENCOUNTER — Encounter: Payer: Self-pay | Admitting: Family Medicine

## 2019-11-04 ENCOUNTER — Ambulatory Visit (INDEPENDENT_AMBULATORY_CARE_PROVIDER_SITE_OTHER): Payer: Medicare HMO | Admitting: Family Medicine

## 2019-11-04 VITALS — BP 102/62 | HR 63 | Ht 70.0 in | Wt 223.0 lb

## 2019-11-04 DIAGNOSIS — M545 Low back pain, unspecified: Secondary | ICD-10-CM

## 2019-11-04 DIAGNOSIS — R600 Localized edema: Secondary | ICD-10-CM

## 2019-11-04 DIAGNOSIS — I2510 Atherosclerotic heart disease of native coronary artery without angina pectoris: Secondary | ICD-10-CM

## 2019-11-04 DIAGNOSIS — G8929 Other chronic pain: Secondary | ICD-10-CM

## 2019-11-04 DIAGNOSIS — G629 Polyneuropathy, unspecified: Secondary | ICD-10-CM

## 2019-11-04 DIAGNOSIS — F4320 Adjustment disorder, unspecified: Secondary | ICD-10-CM

## 2019-11-04 DIAGNOSIS — R296 Repeated falls: Secondary | ICD-10-CM

## 2019-11-04 DIAGNOSIS — I10 Essential (primary) hypertension: Secondary | ICD-10-CM

## 2019-11-04 MED ORDER — LISINOPRIL 40 MG PO TABS: 40.0000 mg | ORAL_TABLET | Freq: Every day | ORAL | 3 refills | Status: DC

## 2019-11-04 MED ORDER — DULOXETINE HCL 30 MG PO CPEP: 30.0000 mg | ORAL_CAPSULE | Freq: Every day | ORAL | 2 refills | Status: DC

## 2019-11-04 MED ORDER — ROSUVASTATIN CALCIUM 10 MG PO TABS: 10.0000 mg | ORAL_TABLET | Freq: Every day | ORAL | 3 refills | Status: DC

## 2019-11-04 MED ORDER — GABAPENTIN 300 MG PO CAPS: 300.0000 mg | ORAL_CAPSULE | Freq: Three times a day (TID) | ORAL | 2 refills | Status: DC

## 2019-11-04 NOTE — Assessment & Plan Note (Addendum)
Chronic, suspect venous insufficiency. Echocardiogram within normal limits. DVT ultrasound negative. Differential also includes liver disease or worsening of baseline anemia. Will recheck today. Has significant edema, improved with elevation and compression stockings.

## 2019-11-04 NOTE — Assessment & Plan Note (Addendum)
No chest pain or symptoms. Now on Carvedilol. Lipid panel today.

## 2019-11-04 NOTE — Progress Notes (Signed)
    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  Low mood The patient has a history of mood disorder, primarily depression. Taking Cymbalta 30 mg without side effects. Reports change in appetite and poor sleep at times. Often wakes up to snack at night. Has three children ([place], [place], and [place]). Talks with them on the phone and enjoys talking with them. Lives with his wife, with some stress in relationship. No SI/HI. He is interested in therapy, if Medicare will pay. Has previously had therapist. Does intermittently use EtOH, none in excess.   Disability form Patient previously worked as a Marine scientist. He is unable to stand for more than a few minutes. Can still write, read, and watch TV. Ambulates short distances with a cane or walker. He denies chest pain, dyspnea on exertion, dizziness. He is able to focus. He was admitted with COVID19 pneumonia in January and subsequently developed a left hip wound and subsequently developed psoas abscess and diskitis. He has completed antibiotics (both IV and oral) and undergone debridement.  Left hip wound is healing.   Falls at home  The patient reports he has had weakness, which is improving. He has chronic back pain and LE weakness. He uses a cane or walker. Has gravel driveway and tripped 3 days ago. Did not hit his head. No falls in home. No chest pain, dizziness, presyncope prior to episode. He is interested in PT again. No worsening numbness of LE (has chronic neuropathic pain).  Patient takes oxycodone 10 mg BID (often less). Rx from pain management. He drinks 1-2 alcoholic beverages per day. He is also taking low dose naproxen.   PERTINENT  PMH / PSH/Family/Social History :  CAD  HTN  History of cervical disc disease    OBJECTIVE:     BP 102/62   Pulse 63   Ht 5\' 10"  (1.778 m)   Wt 223 lb (101.2 kg)   SpO2 97%   BMI 32.00 kg/m   HEENT: Sclera anicteric. Dentition is moderate. Appears well hydrated but tired.  Cardiac: Regular rate and rhythm. Normal S1/S2. No murmurs, rubs, or gallops appreciated. Lungs: Clear bilaterally to ascultation.  Extremities: Warm, 2+ edema  Skin: Warm, dry Psych: Pleasant and appropriate  ASSESSMENT/PLAN:   CAD (coronary artery disease) No chest pain or symptoms. Now on Carvedilol. Lipid panel today.   Leg edema Chronic, suspect venous insufficiency. Echocardiogram within normal limits. DVT ultrasound negative. Differential also includes liver disease or worsening of baseline anemia. Will recheck today. Has significant edema, improved with elevation and compression stockings.   Adjustment disorder Prior PTSD/Depression. Continue Cymbalta. Suspect primarily adjustment disorder as he can longer perform typical activities. No SI/HI. Recommended psychology today. Discussed--follow up in August.    History of psoas abscess, diskitis Will complete disability form.  HCM  Colonoscopy  Patient did receive 2 doses of COVID Rx for Tdap at follow up  Needs ultrasound of aneurysm in 2024   Ivan Singh, MD  [place]

## 2019-11-04 NOTE — Telephone Encounter (Signed)
Reviewed, completed, and signed form.  Note routed to RN team inbasket and placed completed form in Clinic RN's office (wall pocket above desk).  Takenya Travaglini M Fantasia Jinkins, MD   

## 2019-11-04 NOTE — Patient Instructions (Signed)
It was wonderful to see you today.  Please bring ALL of your medications with you to every visit.   Today we talked about:   1. Completing your form--I will finish and fax 2. Going to the lab--- I will call you with results 3. Checking out [email or web address]  USE YOUR CANE OR WALKER AT ALL TIMES  You should be called about home PT    Thank you for choosing [facility].   Please call [phone] with any questions about today's appointment.  Please be sure to schedule follow up at the front  desk before you leave today.   [name], MD  Family Medicine

## 2019-11-04 NOTE — Assessment & Plan Note (Signed)
Prior PTSD/Depression. Continue Cymbalta. Suspect primarily adjustment disorder as he can longer perform typical activities. No SI/HI. Recommended psychology today. Discussed--follow up in August.

## 2019-11-05 ENCOUNTER — Ambulatory Visit: Payer: Medicare HMO | Admitting: Family Medicine

## 2019-11-05 LAB — HEPATIC FUNCTION PANEL
ALT: 16 IU/L (ref 0–44)
AST: 17 IU/L (ref 0–40)
Albumin: 4.3 g/dL (ref 3.7–4.7)
Alkaline Phosphatase: 62 IU/L (ref 48–121)
Bilirubin Total: <0.2 mg/dL (ref 0.0–1.2)
Bilirubin, Direct: 0.11 mg/dL (ref 0.00–0.40)
Total Protein: 6.2 g/dL (ref 6.0–8.5)

## 2019-11-05 LAB — BASIC METABOLIC PANEL
BUN/Creatinine Ratio: 28 — ABNORMAL HIGH (ref 10–24)
BUN: 22 mg/dL (ref 8–27)
CO2: 20 mmol/L (ref 20–29)
Calcium: 8.9 mg/dL (ref 8.6–10.2)
Chloride: 107 mmol/L — ABNORMAL HIGH (ref 96–106)
Creatinine, Ser: 0.78 mg/dL (ref 0.76–1.27)
GFR calc Af Amer: 105 mL/min/{1.73_m2} (ref >59)
GFR calc non Af Amer: 91 mL/min/{1.73_m2} (ref >59)
Glucose: 83 mg/dL (ref 65–99)
Potassium: 4.7 mmol/L (ref 3.5–5.2)
Sodium: 144 mmol/L (ref 134–144)

## 2019-11-05 LAB — LIPID PANEL
Chol/HDL Ratio: 3.2 ratio (ref 0.0–5.0)
Cholesterol, Total: 208 mg/dL — ABNORMAL HIGH (ref 100–199)
HDL: 65 mg/dL (ref >39)
LDL Chol Calc (NIH): 119 mg/dL — ABNORMAL HIGH (ref 0–99)
Triglycerides: 136 mg/dL (ref 0–149)
VLDL Cholesterol Cal: 24 mg/dL (ref 5–40)

## 2019-11-05 LAB — CBC
Hematocrit: 43.2 % (ref 37.5–51.0)
Hemoglobin: 14.1 g/dL (ref 13.0–17.7)
MCH: 29.9 pg (ref 26.6–33.0)
MCHC: 32.6 g/dL (ref 31.5–35.7)
MCV: 92 fL (ref 79–97)
Platelets: 220 10*3/uL (ref 150–450)
RBC: 4.72 x10E6/uL (ref 4.14–5.80)
RDW: 14.4 % (ref 11.6–15.4)
WBC: 6.6 10*3/uL (ref 3.4–10.8)

## 2019-11-05 LAB — HEMOGLOBIN A1C
Est. average glucose Bld gHb Est-mCnc: 120 mg/dL
Hgb A1c MFr Bld: 5.8 % — ABNORMAL HIGH (ref 4.8–5.6)

## 2019-11-05 NOTE — Telephone Encounter (Signed)
Placed in to be faxed pile.  Will be faxed to number provided [phone].  Copy made for batch scanning and pt request to pick up the original. Christen Bame, CMA

## 2019-11-08 ENCOUNTER — Other Ambulatory Visit: Payer: Self-pay | Admitting: *Deleted

## 2019-11-08 DIAGNOSIS — I739 Peripheral vascular disease, unspecified: Secondary | ICD-10-CM

## 2019-11-10 ENCOUNTER — Other Ambulatory Visit: Payer: Self-pay

## 2019-11-10 ENCOUNTER — Ambulatory Visit (INDEPENDENT_AMBULATORY_CARE_PROVIDER_SITE_OTHER): Payer: Medicare HMO | Admitting: Infectious Disease

## 2019-11-10 ENCOUNTER — Encounter: Payer: Self-pay | Admitting: Infectious Disease

## 2019-11-10 VITALS — BP 144/81 | HR 71 | Temp 98.2°F | Wt 221.0 lb

## 2019-11-10 DIAGNOSIS — L270 Generalized skin eruption due to drugs and medicaments taken internally: Secondary | ICD-10-CM

## 2019-11-10 DIAGNOSIS — F4310 Post-traumatic stress disorder, unspecified: Secondary | ICD-10-CM

## 2019-11-10 DIAGNOSIS — M4646 Discitis, unspecified, lumbar region: Secondary | ICD-10-CM

## 2019-11-10 DIAGNOSIS — L89104 Pressure ulcer of unspecified part of back, stage 4: Secondary | ICD-10-CM | POA: Diagnosis not present
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
## 2019-11-11 ENCOUNTER — Encounter (HOSPITAL_BASED_OUTPATIENT_CLINIC_OR_DEPARTMENT_OTHER): Payer: Medicare HMO | Attending: Internal Medicine | Admitting: Internal Medicine

## 2019-11-11 DIAGNOSIS — I252 Old myocardial infarction: Secondary | ICD-10-CM | POA: Insufficient documentation

## 2019-11-11 DIAGNOSIS — X58XXXA Exposure to other specified factors, initial encounter: Secondary | ICD-10-CM | POA: Diagnosis not present

## 2019-11-11 DIAGNOSIS — T8131XD Disruption of external operation (surgical) wound, not elsewhere classified, subsequent encounter: Secondary | ICD-10-CM | POA: Diagnosis not present

## 2019-11-11 DIAGNOSIS — S7002XA Contusion of left hip, initial encounter: Secondary | ICD-10-CM | POA: Insufficient documentation

## 2019-11-11 DIAGNOSIS — I10 Essential (primary) hypertension: Secondary | ICD-10-CM | POA: Diagnosis not present

## 2019-11-11 DIAGNOSIS — Z8616 Personal history of COVID-19: Secondary | ICD-10-CM | POA: Diagnosis not present

## 2019-11-11 DIAGNOSIS — K6812 Psoas muscle abscess: Secondary | ICD-10-CM | POA: Insufficient documentation

## 2019-11-11 DIAGNOSIS — M1990 Unspecified osteoarthritis, unspecified site: Secondary | ICD-10-CM | POA: Insufficient documentation

## 2019-11-11 DIAGNOSIS — L89224 Pressure ulcer of left hip, stage 4: Secondary | ICD-10-CM | POA: Diagnosis present

## 2019-11-11 LAB — SEDIMENTATION RATE: Sed Rate: 2 mm/h (ref 0–20)

## 2019-11-11 LAB — C-REACTIVE PROTEIN: CRP: 7.4 mg/L (ref <8.0)

## 2019-11-12 NOTE — Progress Notes (Signed)
FYNN, VANBLARCOM ([id number]) Visit Report for 11/11/2019 Arrival Information Details Patient Name: Date of Service: Ivan [name]. 11/11/2019 9:30 A [name] Medical Record Number: [id number] Patient Account Number: [id number] Date of Birth/Sex: Treating RN: 06-Jun-1945 (73 y.o. [name]) Carlene Coria Primary Care Samiksha Pellicano: [name] Other Clinician: Referring Avigail Pilling: Treating Rontae Inglett/Extender: Fonnie Jarvis in Treatment: 15 Visit Information History Since Last Visit All ordered tests and consults were completed: No Patient Arrived: Ivan [name] Added or deleted any medications: No Arrival Time: 09:34 Any new allergies or adverse reactions: No Accompanied By: self Had a fall or experienced change in No Transfer Assistance: None activities of daily living that may affect Patient Identification Verified: Yes risk of falls: Secondary Verification Process Completed: Yes Signs or symptoms of abuse/neglect since last visito No Patient Requires Transmission-Based Precautions: No Hospitalized since last visit: No Patient Has Alerts: No Implantable device outside of the clinic excluding No cellular tissue based products placed in the center since last visit: Has Dressing in Place as Prescribed: Yes Pain Present Now: No Electronic Signature(s) Signed: 11/12/2019 3:42:07 PM By: Carlene Coria RN Entered By: Carlene Coria on 11/11/2019 09:34:46 -------------------------------------------------------------------------------- Clinic Level of Care Assessment Details Patient Name: Date of Service: Ivan [name]. 11/11/2019 9:30 A [name] Medical Record Number: [id number] Patient Account Number: [id number] Date of Birth/Sex: Treating RN: 06/29/1945 (74 y.o. [name] Primary Care Schylar Wuebker: [name] Other Clinician: Referring Ambert Virrueta: Treating Dupree Givler/Extender: Fonnie Jarvis in Treatment: 15 Clinic Level of Care Assessment Items TOOL 4 Quantity  Score X- 1 0 Use when only an EandM is performed on FOLLOW-UP visit ASSESSMENTS - Nursing Assessment / Reassessment X- 1 10 Reassessment of Co-morbidities (includes updates in patient status) X- 1 5 Reassessment of Adherence to Treatment Plan ASSESSMENTS - Wound and Skin A ssessment / Reassessment X - Simple Wound Assessment / Reassessment - one wound 1 5 []  - 0 Complex Wound Assessment / Reassessment - multiple wounds []  - 0 Dermatologic / Skin Assessment (not related to wound area) ASSESSMENTS - Focused Assessment []  - 0 Circumferential Edema Measurements - multi extremities []  - 0 Nutritional Assessment / Counseling / Intervention []  - 0 Lower Extremity Assessment (monofilament, tuning fork, pulses) []  - 0 Peripheral Arterial Disease Assessment (using hand held doppler) ASSESSMENTS - Ostomy and/or Continence Assessment and Care []  - 0 Incontinence Assessment and Management []  - 0 Ostomy Care Assessment and Management (repouching, etc.) PROCESS - Coordination of Care X - Simple Patient / Family Education for ongoing care 1 15 []  - 0 Complex (extensive) Patient / Family Education for ongoing care X- 1 10 Staff obtains Programmer, systems, Records, T Results / Process Orders est X- 1 10 Staff telephones HHA, Nursing Homes / Clarify orders / etc []  - 0 Routine Transfer to another Facility (non-emergent condition) []  - 0 Routine Hospital Admission (non-emergent condition) []  - 0 New Admissions / Biomedical engineer / Ordering NPWT Apligraf, etc. , []  - 0 Emergency Hospital Admission (emergent condition) X- 1 10 Simple Discharge Coordination []  - 0 Complex (extensive) Discharge Coordination PROCESS - Special Needs []  - 0 Pediatric / Minor Patient Management []  - 0 Isolation Patient Management []  - 0 Hearing / Language / Visual special needs []  - 0 Assessment of Community assistance (transportation, D/C planning, etc.) []  - 0 Additional assistance / Altered  mentation []  - 0 Support Surface(s) Assessment (bed, cushion, seat, etc.) INTERVENTIONS - Wound Cleansing / Measurement X - Simple Wound Cleansing - one wound  1 5 []  - 0 Complex Wound Cleansing - multiple wounds X- 1 5 Wound Imaging (photographs - any number of wounds) []  - 0 Wound Tracing (instead of photographs) X- 1 5 Simple Wound Measurement - one wound []  - 0 Complex Wound Measurement - multiple wounds INTERVENTIONS - Wound Dressings X - Small Wound Dressing one or multiple wounds 1 10 []  - 0 Medium Wound Dressing one or multiple wounds []  - 0 Large Wound Dressing one or multiple wounds X- 1 5 Application of Medications - topical []  - 0 Application of Medications - injection INTERVENTIONS - Miscellaneous []  - 0 External ear exam []  - 0 Specimen Collection (cultures, biopsies, blood, body fluids, etc.) []  - 0 Specimen(s) / Culture(s) sent or taken to Lab for analysis []  - 0 Patient Transfer (multiple staff / Civil Service fast streamer / Similar devices) []  - 0 Simple Staple / Suture removal (25 or less) []  - 0 Complex Staple / Suture removal (26 or more) []  - 0 Hypo / Hyperglycemic Management (close monitor of Blood Glucose) []  - 0 Ankle / Brachial Index (ABI) - do not check if billed separately X- 1 5 Vital Signs Has the patient been seen at the hospital within the last three years: Yes Total Score: 100 Level Of Care: New/Established - Level 3 Electronic Signature(s) Signed: 11/12/2019 4:42:40 PM By: Levan Hurst RN, BSN Entered By: Levan Hurst on 11/11/2019 11:37:05 -------------------------------------------------------------------------------- Encounter Discharge Information Details Patient Name: Date of Service: Ivan [name], Ivan BERT W. 11/11/2019 9:30 A [name] Medical Record Number: [id number] Patient Account Number: [id number] Date of Birth/Sex: Treating RN: [birth year] (74 y.o. [name] Primary Care Irina Okelly: [name] Other Clinician: Referring  Ipek Westra: Treating Doreatha Offer/Extender: Fonnie Jarvis in Treatment: 15 Encounter Discharge Information Items Discharge Condition: Stable Ambulatory Status: Cane Discharge Destination: Home Transportation: Private Auto Accompanied By: self Schedule Follow-up Appointment: Yes Clinical Summary of Care: Patient Declined Electronic Signature(s) Signed: 11/11/2019 6:06:33 PM By: Baruch Gouty RN, BSN Entered By: Baruch Gouty on 11/11/2019 10:43:11 -------------------------------------------------------------------------------- Multi Wound Chart Details Patient Name: Date of Service: Ivan [name], Ivan BERT W. 11/11/2019 9:30 A [name] Medical Record Number: [id number] Patient Account Number: [id number] Date of Birth/Sex: Treating RN: Dec 29, 1945 (74 y.o. [name] Primary Care Greyson Riccardi: [name] Other Clinician: Referring Traveon Louro: Treating Lanie Schelling/Extender: Ronnie Derby, [name] in Treatment: 15 Vital Signs Height(in): 70 Pulse(bpm): 12 Weight(lbs): 198 Blood Pressure(mmHg): 165/79 Body Mass Index(BMI): 28 Temperature(F): 97.8 Respiratory Rate(breaths/min): 18 Photos: [1:No Photos Left Trochanter] [N/A:N/A N/A] Wound Location: [1:Surgical Injury] [N/A:N/A] Wounding Event: [1:Pressure Ulcer] [N/A:N/A] Primary Etiology: [1:Open Surgical Wound] [N/A:N/A] Secondary Etiology: [1:Cataracts, Coronary Artery Disease,] [N/A:N/A] Comorbid History: [1:Hypertension, Myocardial Infarction, Osteoarthritis, Osteomyelitis 07/01/2019] [N/A:N/A] Date Acquired: [1:15] [N/A:N/A] Weeks of Treatment: [1:Open] [N/A:N/A] Wound Status: [1:2.2x2x2] [N/A:N/A] Measurements L x W x D (cm) [1:3.456] [N/A:N/A] A (cm) : rea [1:6.912] [N/A:N/A] Volume (cm) : [1:84.00%] [N/A:N/A] % Reduction in A rea: [1:77.10%] [N/A:N/A] % Reduction in Volume: [1:1] Position 1 (o'clock): [1:14] Maximum Distance 1 (cm): [1:7] Starting Position 1 (o'clock): [1:2] Ending Position 1  (o'clock): [1:5] Maximum Distance 1 (cm): [1:Yes] [N/A:N/A] Tunneling: [1:Yes] [N/A:N/A] Undermining: [1:Category/Stage IV] [N/A:N/A] Classification: [1:Medium] [N/A:N/A] Exudate A mount: [1:Serosanguineous] [N/A:N/A] Exudate Type: [1:red, brown] [N/A:N/A] Exudate Color: [1:Well defined, not attached] [N/A:N/A] Wound Margin: [1:Large (67-100%)] [N/A:N/A] Granulation A mount: [1:Pink] [N/A:N/A] Granulation Quality: [1:Small (1-33%)] [N/A:N/A] Necrotic A mount: [1:Fat Layer (Subcutaneous Tissue)] [N/A:N/A] Exposed Structures: [1:Exposed: Yes Muscle: Yes Fascia: No Tendon: No Joint: No Bone: No None] [N/A:N/A] Treatment  Notes Wound #1 (Left Trochanter) 2. Periwound Care Skin Prep 3. Primary Dressing Applied Other primary dressing (specifiy in notes) 4. Secondary Dressing Foam Border Dressing Notes anasept gel moistened gauze packing Electronic Signature(s) Signed: 11/11/2019 5:47:00 PM By: [name] MD Signed: 11/12/2019 4:42:40 PM By: Levan Hurst RN, BSN Entered By: [name] on 11/11/2019 11:04:22 -------------------------------------------------------------------------------- Multi-Disciplinary Care Plan Details Patient Name: Date of Service: Ivan [name], Ivan BERT W. 11/11/2019 9:30 A [name] Medical Record Number: [id number] Patient Account Number: [id number] Date of Birth/Sex: Treating RN: Apr 16, 1946 (74 y.o. [name] Primary Care Celine Dishman: [name] Other Clinician: Referring Taz Vanness: Treating Haddy Mullinax/Extender: Ronnie Derby, [name] in Treatment: 15 Active Inactive Wound/Skin Impairment Nursing Diagnoses: Knowledge deficit related to ulceration/compromised skin integrity Goals: Patient/caregiver will verbalize understanding of skin care regimen Date Initiated: 07/29/2019 Target Resolution Date: 12/10/2019 Goal Status: Active Interventions: Assess patient/caregiver ability to obtain necessary supplies Assess patient/caregiver ability to  perform ulcer/skin care regimen upon admission and as needed Provide education on ulcer and skin care Treatment Activities: Skin care regimen initiated : 07/29/2019 Topical wound management initiated : 07/29/2019 Notes: Electronic Signature(s) Signed: 11/12/2019 4:42:40 PM By: Levan Hurst RN, BSN Entered By: Levan Hurst on 11/11/2019 11:36:08 -------------------------------------------------------------------------------- Pain Assessment Details Patient Name: Date of Service: Ivan [name], Ivan BERT W. 11/11/2019 9:30 A [name] Medical Record Number: [id number] Patient Account Number: [id number] Date of Birth/Sex: Treating RN: January 14, 1946 (73 y.o. Oval Linsey Primary Care Nadya Hopwood: [name] Other Clinician: Referring Deosha Werden: Treating Saara Kijowski/Extender: Ronnie Derby, [name] in Treatment: 15 Active Problems Location of Pain Severity and Description of Pain Patient Has Paino No Site Locations Pain Management and Medication Current Pain Management: Electronic Signature(s) Signed: 11/12/2019 3:42:07 PM By: Carlene Coria RN Entered By: Carlene Coria on 11/11/2019 09:35:16 -------------------------------------------------------------------------------- Patient/Caregiver Education Details Patient Name: Date of Service: Ivan [name], Ivan BERT W. 7/1/2021andnbsp9:30 A [name] Medical Record Number: [id number] Patient Account Number: [id number] Date of Birth/Gender: Treating RN: 1946/04/28 (74 y.o. [name] Primary Care Physician: [name] Other Clinician: Referring Physician: Treating Physician/Extender: Fonnie Jarvis in Treatment: 15 Education Assessment Education Provided To: Patient Education Topics Provided Wound/Skin Impairment: Methods: Explain/Verbal Responses: State content correctly [employer]) Signed: 11/12/2019 4:42:40 PM By: Levan Hurst RN, BSN Entered By: Levan Hurst on 11/11/2019  11:36:20 -------------------------------------------------------------------------------- Wound Assessment Details Patient Name: Date of Service: Ivan [name], Ivan BERT W. 11/11/2019 9:30 A [name] Medical Record Number: [id number] Patient Account Number: [id number] Date of Birth/Sex: Treating RN: Aug 05, 1945 (73 y.o. [name]) Carlene Coria Primary Care Shulem Mader: [name] Other Clinician: Referring Squire Withey: Treating Ameenah Prosser/Extender: Ronnie Derby, [name] in Treatment: 15 Wound Status Wound Number: 1 Primary Pressure Ulcer Etiology: Wound Location: Left Trochanter Secondary Open Surgical Wound Wounding Event: Surgical Injury Etiology: Date Acquired: 07/01/2019 Wound Open Weeks Of Treatment: 15 Status: Clustered Wound: No Comorbid Cataracts, Coronary Artery Disease, Hypertension, Myocardial History: Infarction, Osteoarthritis, Osteomyelitis Wound Measurements Length: (cm) 2.2 Width: (cm) 2 Depth: (cm) 2 Area: (cm) 3.456 Volume: (cm) 6.912 % Reduction in Area: 84% % Reduction in Volume: 77.1% Epithelialization: None Tunneling: Yes Position (o'clock): 1 Maximum Distance: (cm) 14 Undermining: Yes Starting Position (o'clock): 7 Ending Position (o'clock): 2 Maximum Distance: (cm) 5 Wound Description Classification: Category/Stage IV Wound Margin: Well defined, not attached Exudate Amount: Medium Exudate Type: Serosanguineous Exudate Color: red, brown Foul Odor After Cleansing: No Slough/Fibrino Yes Wound Bed Granulation Amount: Large (67-100%) Exposed Structure Granulation Quality: Pink Fascia Exposed: No Necrotic Amount: Small (1-33%) Fat Layer (Subcutaneous Tissue)  Exposed: Yes Necrotic Quality: Adherent Slough Tendon Exposed: No Muscle Exposed: Yes Necrosis of Muscle: No Joint Exposed: No Bone Exposed: No Treatment Notes Wound #1 (Left Trochanter) 2. Periwound Care Skin Prep 3. Primary Dressing Applied Other primary dressing (specifiy in notes) 4.  Secondary Dressing Foam Border Dressing Notes anasept gel moistened gauze packing Electronic Signature(s) Signed: 11/12/2019 3:42:07 PM By: Carlene Coria RN Signed: 11/12/2019 4:42:40 PM By: Levan Hurst RN, BSN Entered By: Levan Hurst on 11/11/2019 10:17:22 -------------------------------------------------------------------------------- Vitals Details Patient Name: Date of Service: Ivan [name], Ivan BERT W. 11/11/2019 9:30 A [name] Medical Record Number: [id number] Patient Account Number: [id number] Date of Birth/Sex: Treating RN: 1945/11/12 (73 y.o. [name]) Carlene Coria Primary Care Elizibeth Breau: [name] Other Clinician: Referring Chevelle Coulson: Treating Sander Speckman/Extender: Ronnie Derby, [name] in Treatment: 15 Vital Signs Time Taken: 09:34 Temperature (F): 97.8 Height (in): 70 Pulse (bpm): 65 Weight (lbs): 198 Respiratory Rate (breaths/min): 18 Body Mass Index (BMI): 28.4 Blood Pressure (mmHg): 165/79 Reference Range: 80 - 120 mg / dl Electronic Signature(s) Signed: 11/12/2019 3:42:07 PM By: Carlene Coria RN Entered By: Carlene Coria on 11/11/2019 09:35:10

## 2019-11-12 NOTE — Progress Notes (Signed)
**Note Ivan-Identified via Obfuscation** Ivan [name] ([id number]) Visit Report for 11/11/2019 HPI Details Patient Name: Date of Service: Ivan [name]. 11/11/2019 9:30 A M Medical Record Number: [id number] Patient Account Number: [id number] Date of Birth/Sex: Treating RN: [birth year] (74 y.o. 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 He required admission to hospital from 1/5 through 1/9 with Covid pneumonia. At that point he apparently left AMA. He was noted to have a left hip bruise at the time of discharge. I suspect this really was a deep tissue injury. He was readmitted from 1/13 through 1//27 with altered mental status Covid pneumonia and delirium. At that point he had a left hip and thigh wound which had worsened. Felt to be a pressure injury. He was seen by general surgery who recommended normal saline wet-to-dry twice daily he did not have a debridement. After he left at that point he was incapacitated by increasing lumbar pain. An MRI on 2/9 showed discitis and osteomyelitis at L1/2. He asked aspiration of 1 cm fluid however I think this culture negative. He was also noted at this point to have a very large wound over the left greater trochanter. He was taken to the OR by Dr. Marla [name] for debridement with ACell placement. He was placed in a wound VAC. A culture at that point showed Enterococcus and Enterobacter. An MRI on 3/15 showed no evidence of left hip septic arthritis or osteomyelitis. Extensive subcutaneous edema within the visualized soft tissue of the left thigh and prominent left inguinal lymph nodes likely reactive. He was started on IV cefepime and vancomycin for 6 weeks. He was discharged to [place] facility as the patient.  There I gather the wound developed the necrotic surface. He was seen by the wound care doctor in house who correctly pointed out that a wound VAC would not do anything with a necrotic surface. Since then he has been using Santyl The patient is now at home. He was on vancomycin and cefepime. He developed hives and a generalized rash and was seen in the ER on 3/15 by Dr. Cain [name] recommendation he was changed to meropenem however he also apparently developed hives after this. He says the original hives stopped after the cefepime was stopped and follow-up hives only developed after the meropenem. He is only taken 1 dose of the fear of the meropenem and now is on vancomycin. 3/25; the patient was seen by Dr. Linus [name] on 3/22. Because of intolerances to meropenem which I communicated with Dr. De [name] about he is now on daptomycin and Cipro until sometime in early April. He seems to be tolerating the antibiotics well. We are using Santyl back by wet-to-dry dressings. And making some improvement in the general surface of this very large wound 4/8; we are using Santyl back by wet-to-dry. We are making progress in terms of prepping the wound for a wound VAC. Still necrotic material requiring debridement. 4/16; we are using Santyl back by wet-to-dry. Not convinced that his wife is packing this properly there is a 6 cm tunnel at 12:00. We brought this to attention. Still requiring mechanical debridement 4/29; 2-week follow-up. In general the wound is better however there is considerable tunneling tunneling to the greater trochanter itself. There is still some debris in the deeper parts of this wound I  did not do mechanical debridement on this today. I am going to change the dressing to quarter strength Dakin's wet-to-dry. We will put a wound VAC through his insurance. His wife has had brain damage from a previous meningioma surgery. She is not comfortable around strangers in her home. The patient is concerned about  this 5/18; patient is using quarter strength Dakin's wet to dry. The problem is a deep pressure area over the left greater trochanter. There was some question from her intake nurses that this is actually come in and some question raised about whether he would require a wound VAC or not. The issue with the wound VAC was complicated predominantly by the patient's reluctance to allow people into his home i.e. home health although he tells me he has this mostly "sorted out" 7/1; this is a patient that I have not seen in almost 6 weeks. Apparently canceled several appointments related to family problems deaths in the family etc. He is wife has ophthalmologic surgery next week. I had been trying to get a wound VAC ordered for this deep probing wound over the left greater trochanter but so far I think he is simply doing a Dakin's wet-to-dry and our intake nurse did not think there was any Dakin's on it. I have a note from Dr. Tommy [name] of infectious disease on 6/30. He has now been off doxycycline and ciprofloxacin for several weeks. He has no undue pain or drainage. He also had discitis and osteomyelitis at L1-L2 with a 1 cm left psoas abscess as well as some increased signal at T10/T11. He is not having any problems related to this. Electronic Signature(s) Signed: 11/11/2019 5:47:00 PM By: Ivan Ham MD Entered By: Ivan [name] on 11/11/2019 11:06:46 -------------------------------------------------------------------------------- Physical Exam Details Patient Name: Date of Service: Ivan [name], Ivan BERT W. 11/11/2019 9:30 A M Medical Record Number: [id number] Patient Account Number: [id number] Date of Birth/Sex: Treating RN: Feb 22, 1946 (74 y.o. Ivan [name] Primary Care Provider: Other Clinician: Dorris [name] Referring Provider: Treating Provider/Extender: Ivan [name] in Treatment: 43 Constitutional Patient is hypertensive.. Pulse regular and within target range for  patient.Marland Kitchen Respirations regular, non-labored and within target range.. Temperature is normal and within the target range for the patient.Marland Kitchen Appears in no distress. Respiratory work of breathing is normal. Integumentary (Hair, Skin) No erythema around the wound. Notes Wound exam; area questions over the left greater trochanter. Reasonably small wound orifice and the tissue does not look ominous however he has still a deep tunneling area over the greater trochanter itself at roughly 1:00, which is beyond the measurement of what I can do in the clinic [approximately 14 cm]. There is no purulence no crepitus no erythema. What I can tell the tissue looks healthy. There is no soft tissue crepitus Electronic Signature(s) Signed: 11/11/2019 5:47:00 PM By: Ivan Ham MD Entered By: Ivan [name] on 11/11/2019 11:08:17 -------------------------------------------------------------------------------- Physician Orders Details Patient Name: Date of Service: Ivan [name], Ivan BERT W. 11/11/2019 9:30 A M Medical Record Number: [id number] Patient Account Number: [id number] Date of Birth/Sex: Treating RN: [birth year] (74 y.o. Ivan [name] Primary Care Provider: Dorris [name] Other Clinician: Referring Provider: Treating Provider/Extender: Ivan [name] in Treatment: 15 Verbal / Phone Orders: No Diagnosis Coding ICD-10 Coding Code Description [phone] Pressure ulcer of left hip, stage 4 T81.31XD Disruption of external operation (surgical) wound, not elsewhere classified, subsequent encounter Follow-up Appointments Return appointment in 3 weeks. Dressing Change Frequency Wound #1 Left Trochanter Change dressing three times  week. - Monday, Wednesday and Friday when wound vac arrives. Patient to continue daily Dakin's dressing until wound vac available. Skin Barriers/Peri-Wound Care Wound #1 Left Trochanter Skin Prep Wound Cleansing Wound #1 Left Trochanter Clean wound with  Wound Cleanser - or wound cleanser Primary Wound Dressing Wound #1 Left Trochanter Other: - 0.25% Dakin's Solution wet to dry use until wound vac arrives. Anasept gel in clinic. Secondary Dressing Wound #1 Left Trochanter Foam Border Negative Presssure Wound Therapy Wound #1 Left Trochanter Wound Vac to wound continuously at 121mm/hg pressure - wound center to order Black and White Foam combination - white foam to undermining. Off-Loading Other: - stay off of left hip to aid in offloading pressure. Home Health dmit to [facility] - for wound vac application 3x/week A Electronic Signature(s) Signed: 11/11/2019 5:47:00 PM By: Ivan Ham MD Signed: 11/12/2019 4:42:40 PM By: Levan Hurst RN, BSN Entered By: Levan Hurst on 11/11/2019 10:24:01 -------------------------------------------------------------------------------- Problem List Details Patient Name: Date of Service: Ivan [name], Ivan BERT W. 11/11/2019 9:30 A M Medical Record Number: [id number] Patient Account Number: [id number] Date of Birth/Sex: Treating RN: [birth year] (74 y.o. Ivan [name] Primary Care Provider: Dorris [name] Other Clinician: Referring Provider: Treating Provider/Extender: Ivan [name] in Treatment: 15 Active Problems ICD-10 Encounter Code Description Active Date MDM Diagnosis L89.224 Pressure ulcer of left hip, stage 4 08/05/2019 No Yes T81.31XD Disruption of external operation (surgical) wound, not elsewhere classified, 07/29/2019 No Yes subsequent encounter Inactive Problems Resolved Problems Electronic Signature(s) Signed: 11/11/2019 5:47:00 PM By: Ivan Ham MD Entered By: Ivan [name] on 11/11/2019 11:04:06 -------------------------------------------------------------------------------- Progress Note Details Patient Name: Date of Service: Ivan [name], Ivan BERT W. 11/11/2019 9:30 A M Medical Record Number: [id number] Patient Account Number:  [id number] Date of Birth/Sex: Treating RN: 1946/04/01 (74 y.o. 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 He required admission to hospital from 1/5 through 1/9 with Covid pneumonia. At that point he apparently left AMA. He was noted to have a left hip bruise at the time of discharge. I suspect this really was a deep tissue injury. He was readmitted from 1/13 through 1//27 with altered mental status Covid pneumonia and delirium. At that point he had a left hip and thigh wound which had worsened. Felt to be a pressure injury. He was seen by general surgery who recommended normal saline wet-to-dry twice daily he did not have a debridement. After he left at that point he was incapacitated by increasing lumbar pain. An MRI on 2/9 showed discitis and osteomyelitis at L1/2. He asked aspiration of 1 cm fluid however I think this culture negative. He was also noted at this point to have a very large wound over the left greater trochanter. He was taken to the OR by Dr. Marla [name] for debridement with ACell placement. He was placed in a wound VAC. A culture at that point showed Enterococcus and Enterobacter. An MRI on 3/15 showed no evidence of left hip septic arthritis or osteomyelitis. Extensive subcutaneous edema within the visualized soft tissue of the left thigh and prominent left inguinal lymph nodes likely reactive. He was started on IV cefepime and vancomycin for 6 weeks. He was discharged to [facility] facility as the patient. There I gather the wound developed the necrotic surface.  He was seen by the wound care doctor in house who correctly pointed out that a wound VAC would not do anything with a necrotic surface.  Since then he has been using Santyl The patient is now at home. He was on vancomycin and cefepime. He developed hives and a generalized rash and was seen in the ER on 3/15 by Dr. Cain [name] recommendation he was changed to meropenem however he also apparently developed hives after this. He says the original hives stopped after the cefepime was stopped and follow-up hives only developed after the meropenem. He is only taken 1 dose of the fear of the meropenem and now is on vancomycin. 3/25; the patient was seen by Dr. Linus [name] on 3/22. Because of intolerances to meropenem which I communicated with Dr. De [name] about he is now on daptomycin and Cipro until sometime in early April. He seems to be tolerating the antibiotics well. We are using Santyl back by wet-to-dry dressings. And making some improvement in the general surface of this very large wound 4/8; we are using Santyl back by wet-to-dry. We are making progress in terms of prepping the wound for a wound VAC. Still necrotic material requiring debridement. 4/16; we are using Santyl back by wet-to-dry. Not convinced that his wife is packing this properly there is a 6 cm tunnel at 12:00. We brought this to attention. Still requiring mechanical debridement 4/29; 2-week follow-up. In general the wound is better however there is considerable tunneling tunneling to the greater trochanter itself. There is still some debris in the deeper parts of this wound I did not do mechanical debridement on this today. I am going to change the dressing to quarter strength Dakin's wet-to-dry. We will put a wound VAC through his insurance. His wife has had brain damage from a previous meningioma surgery. She is not comfortable around strangers in her home. The patient is concerned about this 5/18; patient is using quarter strength Dakin's wet to dry. The problem is a deep pressure area over the left greater trochanter. There was some question from her intake nurses that this  is actually come in and some question raised about whether he would require a wound VAC or not. The issue with the wound VAC was complicated predominantly by the patient's reluctance to allow people into his home i.e. home health although he tells me he has this mostly "sorted out" 7/1; this is a patient that I have not seen in almost 6 weeks. Apparently canceled several appointments related to family problems deaths in the family etc. He is wife has ophthalmologic surgery next week. I had been trying to get a wound VAC ordered for this deep probing wound over the left greater trochanter but so far I think he is simply doing a Dakin's wet-to-dry and our intake nurse did not think there was any Dakin's on it. I have a note from Dr. Tommy [name] of infectious disease on 6/30. He has now been off doxycycline and ciprofloxacin for several weeks. He has no undue pain or drainage. He also had discitis and osteomyelitis at L1-L2 with a 1 cm left psoas abscess as well as some increased signal at T10/T11. He is not having any problems related to this. Objective Constitutional Patient is hypertensive.. Pulse regular and within target range for patient.Marland Kitchen Respirations regular, non-labored and within target range.. Temperature is normal and within the target range for the patient.Marland Kitchen Appears in no distress. Vitals Time Taken: 9:34 AM, Height: 70 in, Weight: 198 lbs, BMI: 28.4,  Temperature: 97.8 F, Pulse: 65 bpm, Respiratory Rate: 18 breaths/min, Blood Pressure: 165/79 mmHg. Respiratory work of breathing is normal. General Notes: Wound exam; area questions over the left greater trochanter. Reasonably small wound orifice and the tissue does not look ominous however he has still a deep tunneling area over the greater trochanter itself at roughly 1:00, which is beyond the measurement of what I can do in the clinic [approximately 14 cm]. There is no purulence no crepitus no erythema. What I can tell the tissue looks  healthy. There is no soft tissue crepitus Integumentary (Hair, Skin) No erythema around the wound. Wound #1 status is Open. Original cause of wound was Surgical Injury. The wound is located on the Left Trochanter. The wound measures 2.2cm length x 2cm width x 2cm depth; 3.456cm^2 area and 6.912cm^3 volume. There is muscle and Fat Layer (Subcutaneous Tissue) Exposed exposed. Tunneling has been noted at 1:00 with a maximum distance of 14cm. Undermining begins at 7:00 and ends at 2:00 with a maximum distance of 5cm. There is a medium amount of serosanguineous drainage noted. The wound margin is well defined and not attached to the wound base. There is large (67-100%) pink granulation within the wound bed. There is a small (1-33%) amount of necrotic tissue within the wound bed including Adherent Slough. Assessment Active Problems ICD-10 Pressure ulcer of left hip, stage 4 Disruption of external operation (surgical) wound, not elsewhere classified, subsequent encounter Plan Follow-up Appointments: Return appointment in 3 weeks. Dressing Change Frequency: Wound #1 Left Trochanter: Change dressing three times week. - Monday, Wednesday and Friday when wound vac arrives. Patient to continue daily Dakin's dressing until wound vac available. Skin Barriers/Peri-Wound Care: Wound #1 Left Trochanter: Skin Prep Wound Cleansing: Wound #1 Left Trochanter: Clean wound with Wound Cleanser - or wound cleanser Primary Wound Dressing: Wound #1 Left Trochanter: Other: - 0.25% Dakin's Solution wet to dry use until wound vac arrives. Anasept gel in clinic. Secondary Dressing: Wound #1 Left Trochanter: Foam Border Negative Presssure Wound Therapy: Wound #1 Left Trochanter: Wound Vac to wound continuously at 183mm/hg pressure - wound center to order Black and White Foam combination - white foam to undermining. Off-Loading: Other: - stay off of left hip to aid in offloading pressure. Home Health: Admit to  [facility] - for wound vac application 3x/week #1 I continued with the Dakin's wet-to-dry now. T him that this needs to be maintained moist old 2. I note that Dr. Lucianne Lei dam has him off antibiotics since I last saw him. He did lab work on him last visit. I will try to check this. 3. I do not see any overt infection. He has been treated for osteomyelitis. In order to get this long tunnel to close down we are going to need to try a wound VAC. He has been resistant to this for various reasons. I am not really certain. 4. He finally has agreed to let us put an order for the wound VAC in and to get home [facility]. Hopefully this will go forward. I will try to look at the lab work ordered by Dr. Tommy [name] at his last visit a few days ago Electronic Signature(s) Signed: 11/11/2019 5:47:00 PM By: Ivan Ham MD Entered By: Ivan [name] on 11/11/2019 11:11:34 -------------------------------------------------------------------------------- SuperBill Details Patient Name: Date of Service: Ivan [name], Ivan BERT W. 11/11/2019 Medical Record Number: [id number] Patient Account Number: [id number] Date of Birth/Sex: Treating RN: 03-Dec-1945 (74 y.o. Ivan [name] Primary Care Provider:  Dorris [name] Other Clinician: Referring Provider: Treating Provider/Extender: Ivan [name] in Treatment: 15 Diagnosis Coding ICD-10 Codes Code Description [phone] Pressure ulcer of left hip, stage 4 T81.31XD Disruption of external operation (surgical) wound, not elsewhere classified, subsequent encounter Facility Procedures The patient participates with Medicare or their insurance follows the Medicare Facility Guidelines: CPT4 Code Description Modifier Quantity [id number] [place] VISIT-LEV 3 EST PT 1 Physician Procedures : CPT4 Code Description Modifier [id number] - WC PHYS LEVEL 3 - EST PT ICD-10 Diagnosis Description L89.224 Pressure ulcer of left  hip, stage 4 T81.31XD Disruption of external operation (surgical) wound, not elsewhere classified, subsequent encounter Quantity: 1 Electronic Signature(s) Signed: 11/11/2019 5:47:00 PM By: Ivan Ham MD Signed: 11/12/2019 4:42:40 PM By: Levan Hurst RN, BSN Entered By: Levan Hurst on 11/11/2019 11:37:15

## 2019-11-22 ENCOUNTER — Encounter (HOSPITAL_COMMUNITY): Payer: Medicare HMO

## 2019-11-22 ENCOUNTER — Encounter: Payer: Medicare HMO | Admitting: Surgery

## 2019-11-23 ENCOUNTER — Other Ambulatory Visit: Payer: Self-pay | Admitting: Nurse Practitioner

## 2019-11-23 DIAGNOSIS — I10 Essential (primary) hypertension: Secondary | ICD-10-CM

## 2019-11-24 ENCOUNTER — Ambulatory Visit: Payer: Medicare HMO | Admitting: Infectious Disease

## 2019-11-24 ENCOUNTER — Other Ambulatory Visit: Payer: Self-pay | Admitting: Family Medicine

## 2019-11-24 ENCOUNTER — Other Ambulatory Visit: Payer: Self-pay

## 2019-11-24 ENCOUNTER — Ambulatory Visit (INDEPENDENT_AMBULATORY_CARE_PROVIDER_SITE_OTHER): Payer: Medicare HMO | Admitting: Family Medicine

## 2019-11-24 DIAGNOSIS — R5383 Other fatigue: Secondary | ICD-10-CM

## 2019-11-24 MED ORDER — DULOXETINE HCL 30 MG PO CPEP: 60.0000 mg | ORAL_CAPSULE | Freq: Every day | ORAL | 2 refills | Status: DC

## 2019-11-24 NOTE — Progress Notes (Signed)
PHQ2 and 9 given to patient.  Provider aware.  Ivan [name], [place]

## 2019-11-24 NOTE — Patient Instructions (Signed)
Great to see you today. I am sorry you have been in a low mood since January. Today we doubled the dose of cymbalta to 60mg . Please contact Dr Toy Care for therpay as soon as possible.   National Suicide Prevention Lifeline Hours: Available 24 hours. Languages: [place], [place]. Learn more [phone]  Please come in for your labs in the morning between 8-10am. I will order the testosterone, vitamin D, thyroid studies and anemia panel.  Follow up with me in one weeks time  Dr Posey Pronto

## 2019-11-25 ENCOUNTER — Other Ambulatory Visit: Payer: Medicare HMO

## 2019-11-25 ENCOUNTER — Encounter: Payer: Self-pay | Admitting: Family Medicine

## 2019-11-25 DIAGNOSIS — R5383 Other fatigue: Secondary | ICD-10-CM

## 2019-11-26 ENCOUNTER — Other Ambulatory Visit: Payer: Self-pay | Admitting: Family Medicine

## 2019-11-26 ENCOUNTER — Telehealth: Payer: Self-pay

## 2019-11-26 LAB — ANEMIA PANEL
Ferritin: 379 ng/mL (ref 30–400)
Folate, Hemolysate: 276 ng/mL
Folate, RBC: 604 ng/mL (ref >498)
Hematocrit: 45.7 % (ref 37.5–51.0)
Iron Saturation: 41 % (ref 15–55)
Iron: 62 ug/dL (ref 38–169)
Retic Ct Pct: 1.2 % (ref 0.6–2.6)
Total Iron Binding Capacity: 151 ug/dL — ABNORMAL LOW (ref 250–450)
UIBC: 89 ug/dL — ABNORMAL LOW (ref 111–343)
Vitamin B-12: 493 pg/mL (ref 232–1245)

## 2019-11-26 LAB — TSH: TSH: 3.06 u[IU]/mL (ref 0.450–4.500)

## 2019-11-26 LAB — TESTOSTERONE: Testosterone: 152 ng/dL — ABNORMAL LOW (ref 264–916)

## 2019-11-26 LAB — VITAMIN D 25 HYDROXY (VIT D DEFICIENCY, FRACTURES): Vit D, 25-Hydroxy: 10.6 ng/mL — ABNORMAL LOW (ref 30.0–100.0)

## 2019-11-26 MED ORDER — VITAMIN D (ERGOCALCIFEROL) 1.25 MG (50000 UNIT) PO CAPS: 50000.0000 [IU] | ORAL_CAPSULE | ORAL | 0 refills | Status: DC

## 2019-11-26 NOTE — Telephone Encounter (Signed)
Contacted patient regarding results. Prescribed 50,000 U vitamin D and agreed that we will talk more about the testosterone replacement next week at the follow up visit.

## 2019-11-26 NOTE — Telephone Encounter (Signed)
Patient calls nurse line requesting results and management plan for testosterone. Will forward to [facility] who saw patient and PCP.

## 2019-11-29 ENCOUNTER — Telehealth: Payer: Self-pay

## 2019-11-29 DIAGNOSIS — R5383 Other fatigue: Secondary | ICD-10-CM | POA: Insufficient documentation

## 2019-11-29 NOTE — Progress Notes (Signed)
    SUBJECTIVE:   CHIEF COMPLAINT / HPI:   Ivan [name] is a 74 yr old male who presents today for a testosterone check  Fatigue Pt sees a pain specialist where he had his testosterone level checked because of worsening fatigue levels. He would like a repeat testosterone level today. He spent a large part of this year in hospital due to [place] and also recovering from it. He has been doing physiotherapy which initially helped with strength but he has now stopped as his fatigue has worsened. Admits to being very depressed after his hospitalization. He misses working as an Therapist, sports. See below.  Depression Takes Cymbalta 30mg  daily for depression. Since hospitalization has noticed mood worsening. Pt is expressing passive SI and said "if I was not here in 3 months I would not care". No active SI. No thoughts of self harm or harm to others. Pt sleeps 15 hours a day but has a medium appetite. Has had previous suicide attempt many years ago with a gun. Denies firearms in home.   PERTINENT  PMH / PSH: COVID, falls, depression, strokes. Decubitus ulcer  OBJECTIVE:   BP 102/75   Pulse 76   Ht 5\' 10"  (1.778 m)   Wt 219 lb 6.4 oz (99.5 kg)   SpO2 96%   BMI 31.48 kg/m   General: Alert , cooperative, tearful at times Cardio: Normal S1 and S2, RRR Pulm:  CTAB, Normal respiratory effort Abdomen: Bowel sounds normal. Abdomen soft and non-tender.  Extremities: No peripheral edema. Warm/ well perfused.  Strong radial pulse Neuro: Cranial nerves grossly intact  ASSESSMENT/PLAN:   Fatigue Patient has multiple reasons to be fatigued. Most likely is depression (PHQ>15) and reaction to hospitalization and long standing deconditioning effect. He has passive SI but no active SI. Has previously trying to commit suicide many years ago with fire arms. Denies fire arms in house. He has a psychiatrist who he saw 6 months ago. -Anemia panel, TSH and Vit D level to exclude organic cause of fatigue -Testosterone level  (needs to be checked 8-10am) -Increase Cymbalta to 60mg  daily -Pt to contact his psychiatrist for therapy as soon as possible. -Follow up with me next week. Provided patient with suicide hotline number.     Lattie Haw, MD [place]

## 2019-11-29 NOTE — Telephone Encounter (Signed)
Ivan [name] with [facility] called and LMOM wanting to speak with someone regarding the patient's wound care supply.   Called Ivan [name] back. She states she was trying to get in contact with Dr. [name] who she received the referral from. She will try him again.

## 2019-11-29 NOTE — Assessment & Plan Note (Addendum)
Patient has multiple reasons to be fatigued. Most likely is depression (PHQ>15) and reaction to hospitalization and long standing deconditioning effect. He has passive SI but no active SI. Has previously trying to commit suicide many years ago with fire arms. Denies fire arms in house. He has a psychiatrist who he saw 6 months ago. -Anemia panel, TSH and Vit D level to exclude organic cause of fatigue -Testosterone level (needs to be checked 8-10am) -Increase Cymbalta to 60mg  daily -Pt to contact his psychiatrist for therapy as soon as possible. -Follow up with me next week. Provided patient with suicide hotline number.

## 2019-12-01 ENCOUNTER — Encounter: Payer: Self-pay | Admitting: Family Medicine

## 2019-12-01 ENCOUNTER — Ambulatory Visit (INDEPENDENT_AMBULATORY_CARE_PROVIDER_SITE_OTHER): Payer: Medicare HMO | Admitting: Family Medicine

## 2019-12-01 ENCOUNTER — Other Ambulatory Visit: Payer: Self-pay

## 2019-12-01 DIAGNOSIS — R7989 Other specified abnormal findings of blood chemistry: Secondary | ICD-10-CM | POA: Diagnosis not present

## 2019-12-01 DIAGNOSIS — F321 Major depressive disorder, single episode, moderate: Secondary | ICD-10-CM | POA: Insufficient documentation

## 2019-12-01 MED ORDER — TESTOSTERONE 50 MG/5GM (1%) TD GEL: 5.0000 g | Freq: Every day | TRANSDERMAL | 1 refills | Status: DC

## 2019-12-01 NOTE — Assessment & Plan Note (Signed)
Low testosterone levels due hypogonadism secondary to aging.  Repeated morning testosterone level was under 152 and low. Used shared decision-making to discuss treatment options.  Discussed gel versus injection versus patch.  Patient would like to start testosterone gel and follow-up with his PCP.  Prescribed 1% testosterone gel.

## 2019-12-01 NOTE — Progress Notes (Signed)
    SUBJECTIVE:   CHIEF COMPLAINT / HPI:   Ivan [name] is a 74 yr old male who presents today for follow up for testosterone levels  Low testosterone Patient had his testosterone levels checked at his pain clinic due to fatigue his levels were found to be low so clinic last week and a repeat was test was done.  His testosterone level was 152.  He has come to the speak about treatment options.  Depression Patient has had worsening of mood over the last 6 months due to hospitalization due to Covid and deconditioning.  Last week he was seen by myself in clinic where we increased his Cymbalta to 60 mg daily and that was for him to reach out to his psychiatrist for therapy.  He feels a little better compared to last week however his mood is still low.  He has started taking the 60 mg of Cymbalta daily.  He denies active suicidal thoughts, thoughts of self-harm or harm to others.  He denies passively suicidal thoughts when asked but said "I would not mind if I was dead" and " I question my purpose in life".  PERTINENT  PMH / PSH: Depression, fatigue, Covid, stroke, decubitus ulcer  OBJECTIVE:   BP (!) 122/58   Pulse 65   Ht 5\' 10"  (1.778 m)   Wt 214 lb 9.6 oz (97.3 kg)   SpO2 99%   BMI 30.79 kg/m    General: Alert and cooperative and appears to be in no acute distress Cardio: Well perfused Pulm: Speaking in full sentences normal respiratory effort Neuro: Cranial nerves grossly intact  ASSESSMENT/PLAN:   Low testosterone Low testosterone levels due hypogonadism secondary to aging.  Repeated morning testosterone level was under 152 and low. Used shared decision-making to discuss treatment options.  Discussed gel versus injection versus patch.  Patient would like to start testosterone gel and follow-up with his PCP.  Prescribed 1% testosterone gel.   Depression, major, single episode, moderate (HCC) PHQ 11 today. Patient appears to be brighter mood today compared to last week.  He is  now taking Cymbalta up to 60 mg daily (increased dose from 30 mg last week).  No active SI but is experiencing passive SI at times/questions his purpose in life. Denies firearms in the house.  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  I think his mood is a relatively normal reaction to the sequence of events that happened this year. He has reached out to his psychiatrist and is waiting for a therapy appointment, I recommended that he follows up on this urgently as he would greatly benefit from therapy.  Provided patient with suicide hotline number at previous visit.  Patient recommended to follow-up with his PCP.     Lattie Haw, MD [place]

## 2019-12-01 NOTE — Patient Instructions (Signed)
Testosterone skin gel What is this medicine? TESTOSTERONE (tes TOS ter one) is the main male hormone. It supports normal male traits such as muscle growth, facial hair, and deep voice. This gel is used in males to treat low testosterone levels. This medicine may be used for other purposes; ask your health care provider or pharmacist if you have questions. COMMON BRAND NAME(S): AndroGel, FORTESTA, Testim, Vogelxo  What should I tell my health care provider before I take this medicine? They need to know if you have any of these conditions:  breast cancer  diabetes  heart disease  if a male partner is pregnant or trying to get pregnant  kidney disease  liver disease  lung disease  prostate cancer, enlargement  an unusual or allergic reaction to testosterone, soy proteins, other medicines, foods, dyes, or preservatives  pregnant or trying to get pregnant  breast-feeding   How should I use this medicine? This medicine is for external use only. This medicine is applied at the same time every day (preferably in the morning) to clean, dry, intact skin. If you take a bath or shower in the morning, apply the gel after the bath or shower. Follow the directions on the prescription label. Make sure that you are using your testosterone gel product correctly and applying it only to the appropriate skin area (see below). Allow the skin to dry a few minutes then cover with clothing to prevent others from coming in contact with the medicine on your skin. The gel is flammable. Avoid fire, flame, or smoking until the gel has dried. Wash your hands with soap and water after use. For AndroGel 1% Packets: Open the packet(s) needed for your dose. You can put the entire dose into your palm all at once or just a little at a time to apply. If you prefer, you can instead squeeze the gel directly onto the area you are applying it to. Apply on the shoulders, upper arm, or abdomen as directed. Do not apply to  the scrotum or genitals. Be sure you use the correct total dose. It is best to wait 5 to 6 hours after application of the gel before showering or swimming. For AndroGel 1%: Pump the dose into the palm of your hand. You can put the entire dose into your palm all at once or just a little at a time to apply. If you prefer, you can instead pump the gel directly onto the area you are applying it to. Apply on the shoulders, upper arm, or abdomen as directed. Do not apply to the scrotum or genitals. Be sure you use the correct total dose. It is best to wait for 5 to 6 hours after application of the gel before showering or swimming. For Androgel 1.62% packets: Open the packet(s) needed for your dose. You can put the entire dose into your palm all at once or just a little at a time to apply. If you prefer, you can instead squeeze the gel directly onto the area you are applying it to. Apply on the shoulders and upper arms as directed. Do not apply to other parts of the body including the abdomen, genitals, chest, armpits, or knees. Be sure you use the correct total dose. It is best to wait 2 hours after application of the gel before washing, showering, or swimming. For AndroGel 1.62%: Pump the dose into the palm of your hand. Dispense one pump of gel at a time into the palm of your hand before applying  it. If you prefer, you can instead pump the gel directly onto the area you are applying it to. Apply on the shoulders and upper arms as directed. Do not apply to other parts of the body including the abdomen, genitals, chest, armpits, or knees. Be sure you use the correct total dose. It is best to wait 2 hours after application of the gel before washing, showering, or swimming. For Testim: Open the tube(s) needed for your dose. Squeeze the gel from the tube into the palm of your hand. Apply on the shoulders or upper arms as directed. Do not apply to the scrotum, genitals, or abdomen. Be sure you use the correct total dose.  Do not shower or swim for at least 2 hours after application of the gel. For Fortesta: Use the multi-dose pump to pump the gel directly onto the area you are applying it to. Apply on the thighs as directed. Do not apply to the abdomen, penis, scrotum, shoulders or upper arms. Gently rub the gel onto the skin using your finger. Be sure you use the correct total dose. Do not shower or swim for at least 2 hours after application of the gel. A special MedGuide will be given to you by the pharmacist with each prescription and refill. Be sure to read this information carefully each time. Talk to your pediatrician regarding the use of this medicine in children. Special care may be needed. Overdosage: If you think you have taken too much of this medicine contact a poison control center or emergency room at once. NOTE: This medicine is only for you. Do not share this medicine with others. What if I miss a dose? If you miss a dose, use it as soon as you can. If it is almost time for your next dose, use only that dose. Do not use double or extra doses. What may interact with this medicine?  medicines for diabetes  medicines that treat or prevent blood clots like warfarin  oxyphenbutazone  propranolol  steroid medicines like prednisone or cortisone This list may not describe all possible interactions. Give your health care provider a list of all the medicines, herbs, non-prescription drugs, or dietary supplements you use. Also tell them if you smoke, drink alcohol, or use illegal drugs. Some items may interact with your medicine. What should I watch for while using this medicine? Visit your doctor or health care professional for regular checks on your progress. They will need to check the level of testosterone in your blood. This medicine is only approved for use in men who have low levels of testosterone related to certain medical conditions. Heart attacks and strokes have been reported with the use of  this medicine. Notify your doctor or health care professional and seek emergency treatment if you develop breathing problems; changes in vision; confusion; chest pain or chest tightness; sudden arm pain; severe, sudden headache; trouble speaking or understanding; sudden numbness or weakness of the face, arm or leg; loss of balance or coordination. Talk to your doctor about the risks and benefits of this medicine. This medicine can transfer from your body to others. If a person or pet comes in contact with the area where this medicine was applied to your skin, they may have a serious risk of side effects. If you cannot avoid skin-to-skin contact with another person, make sure the site where this medicine was applied is covered with clothing. If accidental contact happens, the skin of the person or pet should be washed right  away with soap and water. Also, a male partner who is pregnant or trying to get pregnant should avoid contact with the gel or treated skin. This medicine may affect blood sugar levels. If you have diabetes, check with your doctor or health care professional before you change your diet or the dose of your diabetic medicine. This drug is banned from use in athletes by most athletic organizations. What side effects may I notice from receiving this medicine? Side effects that you should report to your doctor or health care professional as soon as possible:  allergic reactions like skin rash, itching or hives, swelling of the face, lips, or tongue  breast enlargement  breathing problems  changes in mood, especially anger, depression, or rage  dark urine  general ill feeling or flu-like symptoms  light-colored stools  loss of appetite, nausea  nausea, vomiting  right upper belly pain  stomach pain  swelling of ankles  too frequent or persistent erections  trouble passing urine or change in the amount of urine  unusually weak or tired  yellowing of the eyes or  skin Side effects that usually do not require medical attention (report to your doctor or health care professional if they continue or are bothersome):  acne  change in sex drive or performance  hair loss  headache This list may not describe all possible side effects. Call your doctor for medical advice about side effects. You may report side effects to FDA at 1-800-FDA-1088. Where should I keep my medicine? Keep out of the reach of children. This medicine can be abused. Keep your medicine in a safe place to protect it from theft. Do not share this medicine with anyone. Selling or giving away this medicine is dangerous and against the law. Store at room temperature between 15 to 30 degrees C (59 to 86 degrees F). Keep closed until use. Protect from heat and light. This medicine is flammable. Avoid exposure to heat, fire, flame, and smoking. Throw away any unused medicine after the expiration date. NOTE: This sheet is a summary. It may not cover all possible information. If you have questions about this medicine, talk to your doctor, pharmacist, or health care provider.  2020 Elsevier/Gold Standard (2013-07-15 08:27:26)

## 2019-12-01 NOTE — Assessment & Plan Note (Signed)
PHQ 11 today. Patient appears to be brighter mood today compared to last week.  He is now taking Cymbalta up to 60 mg daily (increased dose from 30 mg last week).  No active SI but is experiencing passive SI at times/questions his purpose in life. Denies firearms in the house.  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  I think his mood is a relatively normal reaction to the sequence of events that happened this year. He has reached out to his psychiatrist and is waiting for a therapy appointment, I recommended that he follows up on this urgently as he would greatly benefit from therapy.  Provided patient with suicide hotline number at previous visit.  Patient recommended to follow-up with his PCP.

## 2019-12-02 ENCOUNTER — Encounter: Payer: Self-pay | Admitting: Family Medicine

## 2019-12-02 ENCOUNTER — Encounter (HOSPITAL_BASED_OUTPATIENT_CLINIC_OR_DEPARTMENT_OTHER): Payer: Medicare HMO | Admitting: Internal Medicine

## 2019-12-02 DIAGNOSIS — E291 Testicular hypofunction: Secondary | ICD-10-CM | POA: Insufficient documentation

## 2019-12-07 ENCOUNTER — Telehealth: Payer: Self-pay

## 2019-12-07 NOTE — Telephone Encounter (Signed)
Patient calls nurse line reporting his insurance is rejecting topical testosterone. Patient reports he spoke with his insurance company and they will only cover testosterone cypionate oil IM injection. If this is an appropriate alternative, please send in to [facility]

## 2019-12-07 NOTE — Telephone Encounter (Signed)
Routing to ordering provider to review indications etc.   [name], MD  [facility] Medicine Teaching Service

## 2019-12-09 ENCOUNTER — Other Ambulatory Visit: Payer: Self-pay | Admitting: Family Medicine

## 2019-12-09 ENCOUNTER — Telehealth: Payer: Self-pay | Admitting: Family Medicine

## 2019-12-09 NOTE — Telephone Encounter (Signed)
Noted, I will call the patient to discuss whether he would be willing to do IM injections Vs topical.

## 2019-12-09 NOTE — Telephone Encounter (Signed)
Called patient to discuss testosterone injections but no VM to leave a message. Will try again later.

## 2019-12-10 ENCOUNTER — Telehealth: Payer: Self-pay | Admitting: Family Medicine

## 2019-12-10 ENCOUNTER — Other Ambulatory Visit: Payer: Self-pay | Admitting: Family Medicine

## 2019-12-10 NOTE — Telephone Encounter (Signed)
Called patient regarding injections. His pain specialist is now going to prescribe this medication for him.

## 2019-12-10 NOTE — Telephone Encounter (Signed)
Called patient regarding prescribing testosterone injection. He said that his pain doctor is happy to prescribe this for him so he will contact him.

## 2019-12-10 NOTE — Telephone Encounter (Signed)
Patient returns call to nurse line requesting to speak with Dr. Posey Pronto regarding starting testosterone injections.   To Dr. Pierre Bali, RN

## 2019-12-16 ENCOUNTER — Encounter (HOSPITAL_BASED_OUTPATIENT_CLINIC_OR_DEPARTMENT_OTHER): Payer: Medicare HMO | Attending: Internal Medicine | Admitting: Physician Assistant

## 2019-12-16 ENCOUNTER — Other Ambulatory Visit: Payer: Self-pay | Admitting: Nurse Practitioner

## 2019-12-16 DIAGNOSIS — Z8616 Personal history of COVID-19: Secondary | ICD-10-CM | POA: Diagnosis not present

## 2019-12-16 DIAGNOSIS — E669 Obesity, unspecified: Secondary | ICD-10-CM | POA: Diagnosis not present

## 2019-12-16 DIAGNOSIS — Z6828 Body mass index (BMI) 28.0-28.9, adult: Secondary | ICD-10-CM | POA: Insufficient documentation

## 2019-12-16 DIAGNOSIS — K6812 Psoas muscle abscess: Secondary | ICD-10-CM | POA: Diagnosis not present

## 2019-12-16 DIAGNOSIS — T8131XD Disruption of external operation (surgical) wound, not elsewhere classified, subsequent encounter: Secondary | ICD-10-CM | POA: Insufficient documentation

## 2019-12-16 DIAGNOSIS — X58XXXA Exposure to other specified factors, initial encounter: Secondary | ICD-10-CM | POA: Diagnosis not present

## 2019-12-16 DIAGNOSIS — I10 Essential (primary) hypertension: Secondary | ICD-10-CM | POA: Diagnosis not present

## 2019-12-16 DIAGNOSIS — I252 Old myocardial infarction: Secondary | ICD-10-CM | POA: Diagnosis not present

## 2019-12-16 DIAGNOSIS — L509 Urticaria, unspecified: Secondary | ICD-10-CM | POA: Diagnosis not present

## 2019-12-16 DIAGNOSIS — M1990 Unspecified osteoarthritis, unspecified site: Secondary | ICD-10-CM | POA: Diagnosis not present

## 2019-12-16 DIAGNOSIS — L89224 Pressure ulcer of left hip, stage 4: Secondary | ICD-10-CM | POA: Insufficient documentation

## 2019-12-16 NOTE — Progress Notes (Addendum)
MCCADE, SULLENBERGER ([id number]) Visit Report for 12/16/2019 Chief Complaint Document Details Patient Name: Date of Service: Ivan [name]. 12/16/2019 9:45 A M Medical Record Number: [id number] Patient Account Number: [id number] Date of Birth/Sex: Treating RN: [birth year] (74 y.o. [name] Primary Care Provider: [name] Other Clinician: Referring Provider: Treating Provider/Extender: Octaviano Glow, Carina Weeks in Treatment: 20 Information Obtained from: Patient Chief Complaint 07/29/2019; patient is here for review of a fairly substantial wound over the left greater trochanter Electronic Signature(s) Signed: 12/16/2019 10:07:53 AM By: Worthy Keeler PA-C Entered By: Worthy Keeler on 12/16/2019 10:07:52 -------------------------------------------------------------------------------- HPI Details Patient Name: Date of Service: Ivan [name], Ivan BERT W. 12/16/2019 9:45 A M Medical Record Number: [id number] Patient Account Number: [id number] Date of Birth/Sex: Treating RN: Jun 22, 1945 (74 y.o. 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 He required admission to hospital from 1/5 through 1/9 with Covid pneumonia. At that point he apparently left AMA. He was noted to have a left hip bruise at the time of discharge. I suspect this really was a deep tissue injury. He was readmitted from 1/13 through 1//27 with altered mental status Covid pneumonia and delirium. At that point he had a left hip and thigh wound which had worsened. Felt to be a pressure injury. He was seen by general surgery who recommended normal saline wet-to-dry twice daily he did not have a debridement. After he left at that point he was  incapacitated by increasing lumbar pain. An MRI on 2/9 showed discitis and osteomyelitis at L1/2. He asked aspiration of 1 cm fluid however I think this culture negative. He was also noted at this point to have a very large wound over the left greater trochanter. He was taken to the OR by Dr. Marla Roe for debridement with ACell placement. He was placed in a wound VAC. A culture at that point showed Enterococcus and Enterobacter. An MRI on 3/15 showed no evidence of left hip septic arthritis or osteomyelitis. Extensive subcutaneous edema within the visualized soft tissue of the left thigh and prominent left inguinal lymph nodes likely reactive. He was started on IV cefepime and vancomycin for 6 weeks. He was discharged to [facility] facility as the patient. There I gather the wound developed the necrotic surface. He was seen by the wound care doctor in house who correctly pointed out that a wound VAC would not do anything with a necrotic surface. Since then he has been using Santyl The patient is now at home. He was on vancomycin and cefepime. He developed hives and a generalized rash and was seen in the ER on 3/15 by Dr. Cain Sieve recommendation he was changed to meropenem however he also apparently developed hives after this. He says the original hives stopped after the cefepime was stopped and follow-up hives only developed after the meropenem. He is only taken 1 dose of the fear of the meropenem and now is on vancomycin. 3/25; the patient was seen by Dr. Linus Salmons on 3/22. Because of intolerances to meropenem which I communicated with Dr. De Burrs about he is now on daptomycin and Cipro until sometime in early April. He seems to be tolerating the antibiotics well. We are using Santyl back by wet-to-dry dressings. And making some improvement in the general  surface of this very large wound 4/8; we are using Santyl back by wet-to-dry. We are making progress in terms of prepping the wound for a wound VAC.  Still necrotic material requiring debridement. 4/16; we are using Santyl back by wet-to-dry. Not convinced that his wife is packing this properly there is a 6 cm tunnel at 12:00. We brought this to attention. Still requiring mechanical debridement 4/29; 2-week follow-up. In general the wound is better however there is considerable tunneling tunneling to the greater trochanter itself. There is still some debris in the deeper parts of this wound I did not do mechanical debridement on this today. I am going to change the dressing to quarter strength Dakin's wet-to-dry. We will put a wound VAC through his insurance. His wife has had brain damage from a previous meningioma surgery. She is not comfortable around strangers in her home. The patient is concerned about this 5/18; patient is using quarter strength Dakin's wet to dry. The problem is a deep pressure area over the left greater trochanter. There was some question from her intake nurses that this is actually come in and some question raised about whether he would require a wound VAC or not. The issue with the wound VAC was complicated predominantly by the patient's reluctance to allow people into his home i.e. home health although he tells me he has this mostly "sorted out" 7/1; this is a patient that I have not seen in almost 6 weeks. Apparently canceled several appointments related to family problems deaths in the family etc. He is wife has ophthalmologic surgery next week. I had been trying to get a wound VAC ordered for this deep probing wound over the left greater trochanter but so far I think he is simply doing a Dakin's wet-to-dry and our intake nurse did not think there was any Dakin's on it. I have a note from Dr. Tommy Medal of infectious disease on 6/30. He has now been off doxycycline and ciprofloxacin for several weeks. He has no undue pain or drainage. He also had discitis and osteomyelitis at L1-L2 with a 1 cm left psoas abscess as well as  some increased signal at T10/T11. He is not having any problems related to this. 12/16/2019 on evaluation today patient's wound actually is showing some signs of bit of improvement which is good news. Fortunately there is no signs of active infection at this time which is good news. He tells me that the nurses have been coming to change this 3 times a week and doing a great job. He told the intake nurse that they had only been coming out once a week. I am not really sure what is going on with the discrepancy and stories here. Nonetheless I do believe that he needs to have this changed 3 times a week on a regular basis he does seem to have a little bit of a fungal infection around the wound. Electronic Signature(s) Signed: 12/16/2019 11:05:03 AM By: Worthy Keeler PA-C Entered By: Worthy Keeler on 12/16/2019 11:05:03 -------------------------------------------------------------------------------- Physical Exam Details Patient Name: Date of Service: Ivan [name], Ivan BERT W. 12/16/2019 9:45 A M Medical Record Number: [id number] Patient Account Number: [id number] Date of Birth/Sex: Treating RN: 12-05-1945 (74 y.o. [name] Primary Care Provider: [name] Other Clinician: Referring Provider: Treating Provider/Extender: Octaviano Glow, Carina Weeks in Treatment: 20 Constitutional Obese and well-hydrated in no acute distress. Respiratory normal breathing without difficulty. Psychiatric this patient is able to make decisions and demonstrates  good insight into disease process. Alert and Oriented x 3. pleasant and cooperative. Notes Upon inspection patient's wound bed actually showed signs of good granulation at this time. Fortunately there is no signs of active infection which is great news. With that being said he does have a bit of a fungal infection around the wound opening in the periwound underneath the draping is not the entire draped area that is actually red and irritated  therefore I do not believe this is adhesive related skin injury. I think this is much more related to just a fungal infection here. I think we can use some nystatin powder topically that would be beneficial. Electronic Signature(s) Signed: 12/16/2019 11:05:38 AM By: Worthy Keeler PA-C Entered By: Worthy Keeler on 12/16/2019 11:05:37 -------------------------------------------------------------------------------- Physician Orders Details Patient Name: Date of Service: Ivan [name], Ivan BERT W. 12/16/2019 9:45 A M Medical Record Number: [id number] Patient Account Number: [id number] Date of Birth/Sex: Treating RN: 1945-09-05 (74 y.o. [name] Primary Care Provider: [name] Other Clinician: Referring Provider: Treating Provider/Extender: Octaviano Glow, Carina Weeks in Treatment: 20 Verbal / Phone Orders: No Diagnosis Coding ICD-10 Coding Code Description [phone] Pressure ulcer of left hip, stage 4 T81.31XD Disruption of external operation (surgical) wound, not elsewhere classified, subsequent encounter Follow-up Appointments Return Appointment in 2 weeks. Dressing Change Frequency Wound #1 Left Trochanter Change dressing three times week. - Monday, Wednesday and Friday by home health Skin Barriers/Peri-Wound Care Wound #1 Left Trochanter Other: - Crusting technique to periwound: (Nystatin powder, skin prep, powder, skin prep) Wound Cleansing Wound #1 Left Trochanter Clean wound with Wound Cleanser - or wound cleanser Primary Wound Dressing Wound #1 Left Trochanter Other: - Wet-to-dry today in clinic. Home health to resume wound vac at home. Secondary Dressing Wound #1 Left Trochanter Foam Border Negative Presssure Wound Therapy Wound #1 Left Trochanter Medela Wound Vac continuously at 138mm/hg Black and White Foam combination - white foam to undermining Off-Loading Other: - stay off of left hip to aid in offloading pressure. [facility]  skilled nursing for wound care. - [facility] Home Patient Medications llergies: Iodinated Contrast Media, cefepime, meropenem A Notifications Medication Indication Start End 12/16/2019 nystatin DOSE topical 100,000 unit/gram powder - powder topical applied to the rash prior to application of the wound VAC 3 times per week Electronic Signature(s) Signed: 12/16/2019 11:07:25 AM By: Worthy Keeler PA-C Entered By: Worthy Keeler on 12/16/2019 11:07:25 -------------------------------------------------------------------------------- Problem List Details Patient Name: Date of Service: Ivan [name], Ivan BERT W. 12/16/2019 9:45 A M Medical Record Number: [id number] Patient Account Number: [id number] Date of Birth/Sex: Treating RN: 1945/09/20 (74 y.o. [name] Primary Care Provider: [name] Other Clinician: Referring Provider: Treating Provider/Extender: Octaviano Glow, Carina Weeks in Treatment: 20 Active Problems ICD-10 Encounter Code Description Active Date MDM Diagnosis L89.224 Pressure ulcer of left hip, stage 4 08/05/2019 No Yes T81.31XD Disruption of external operation (surgical) wound, not elsewhere classified, 07/29/2019 No Yes subsequent encounter Inactive Problems Resolved Problems Electronic Signature(s) Signed: 12/16/2019 10:07:46 AM By: Worthy Keeler PA-C Entered By: Worthy Keeler on 12/16/2019 10:07:46 -------------------------------------------------------------------------------- Progress Note Details Patient Name: Date of Service: Ivan [name], Ivan BERT W. 12/16/2019 9:45 A M Medical Record Number: [id number] Patient Account Number: [id number] Date of Birth/Sex: Treating RN: [birth year] (74 y.o. 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 He required admission to hospital from 1/5 through 1/9 with Covid pneumonia. At that point he apparently left AMA. He was noted to have a left hip bruise at the time of discharge. I suspect this really was a deep tissue injury. He was readmitted from 1/13 through 1//27 with altered mental status Covid pneumonia and delirium. At that point he had a left hip and thigh wound which had worsened. Felt to be a pressure injury. He was seen by general surgery who recommended normal saline wet-to-dry twice daily he did not have a debridement. After he left at that point he was incapacitated by increasing lumbar pain. An MRI on 2/9 showed discitis and osteomyelitis at L1/2. He asked aspiration of 1 cm fluid however I think this culture negative. He was also noted at this point to have a very large wound over the left greater trochanter. He was taken to the OR by Dr. Marla Roe for debridement with ACell placement. He was placed in a wound VAC. A culture at that point showed Enterococcus and Enterobacter. An MRI on 3/15 showed no evidence of left hip septic arthritis or osteomyelitis. Extensive subcutaneous edema within the visualized soft tissue of the left thigh and prominent left inguinal lymph nodes likely reactive. He was started on IV cefepime and vancomycin for 6 weeks. He was discharged to [place] facility as the patient. There I gather the wound developed the necrotic surface. He was seen by the wound care doctor in house who correctly pointed out that a wound VAC would not do anything with a necrotic surface. Since then he has been using Santyl The patient is now at home. He was on vancomycin and cefepime. He developed hives and a generalized rash and was seen in the ER on 3/15 by Dr. Cain Sieve  recommendation he was changed to meropenem however he also apparently developed hives after this. He says the original hives stopped after the cefepime was stopped and follow-up hives only developed after the meropenem. He is only taken 1 dose of the fear of the meropenem and now is on vancomycin. 3/25; the patient was seen by Dr. Linus Salmons on 3/22. Because of intolerances to meropenem which I communicated with Dr. De Burrs about he is now on daptomycin and Cipro until sometime in early April. He seems to be tolerating the antibiotics well. We are using Santyl back by wet-to-dry dressings. And making some improvement in the general surface of this very large wound 4/8; we are using Santyl back by wet-to-dry. We are making progress in terms of prepping the wound for a wound VAC. Still necrotic material requiring debridement. 4/16; we are using Santyl back by wet-to-dry. Not convinced that his wife is packing this properly there is a 6 cm tunnel at 12:00. We brought this to attention. Still requiring mechanical debridement 4/29; 2-week follow-up. In general the wound is better however there is considerable tunneling tunneling to the greater trochanter itself. There is still some debris in the deeper parts of this wound I did not do mechanical debridement on this today. I am going to change the dressing to quarter strength Dakin's wet-to-dry. We will put a wound VAC through his insurance. His wife has had brain damage from a previous meningioma surgery. She is not comfortable around strangers  in her home. The patient is concerned about this 5/18; patient is using quarter strength Dakin's wet to dry. The problem is a deep pressure area over the left greater trochanter. There was some question from her intake nurses that this is actually come in and some question raised about whether he would require a wound VAC or not. The issue with the wound VAC was complicated predominantly by the patient's reluctance to  allow people into his home i.e. home health although he tells me he has this mostly "sorted out" 7/1; this is a patient that I have not seen in almost 6 weeks. Apparently canceled several appointments related to family problems deaths in the family etc. He is wife has ophthalmologic surgery next week. I had been trying to get a wound VAC ordered for this deep probing wound over the left greater trochanter but so far I think he is simply doing a Dakin's wet-to-dry and our intake nurse did not think there was any Dakin's on it. I have a note from Dr. Tommy Medal of infectious disease on 6/30. He has now been off doxycycline and ciprofloxacin for several weeks. He has no undue pain or drainage. He also had discitis and osteomyelitis at L1-L2 with a 1 cm left psoas abscess as well as some increased signal at T10/T11. He is not having any problems related to this. 12/16/2019 on evaluation today patient's wound actually is showing some signs of bit of improvement which is good news. Fortunately there is no signs of active infection at this time which is good news. He tells me that the nurses have been coming to change this 3 times a week and doing a great job. He told the intake nurse that they had only been coming out once a week. I am not really sure what is going on with the discrepancy and stories here. Nonetheless I do believe that he needs to have this changed 3 times a week on a regular basis he does seem to have a little bit of a fungal infection around the wound. Objective Constitutional Obese and well-hydrated in no acute distress. Vitals Time Taken: 10:07 AM, Height: 70 in, Weight: 198 lbs, BMI: 28.4, Temperature: 98.1 F, Pulse: 60 bpm, Respiratory Rate: 16 breaths/min, Blood Pressure: 100/65 mmHg. Respiratory normal breathing without difficulty. Psychiatric this patient is able to make decisions and demonstrates good insight into disease process. Alert and Oriented x 3. pleasant and  cooperative. General Notes: Upon inspection patient's wound bed actually showed signs of good granulation at this time. Fortunately there is no signs of active infection which is great news. With that being said he does have a bit of a fungal infection around the wound opening in the periwound underneath the draping is not the entire draped area that is actually red and irritated therefore I do not believe this is adhesive related skin injury. I think this is much more related to just a fungal infection here. I think we can use some nystatin powder topically that would be beneficial. Integumentary (Hair, Skin) Wound #1 status is Open. Original cause of wound was Surgical Injury. The wound is located on the Left Trochanter. The wound measures 1.7cm length x 1.5cm width x 2cm depth; 2.003cm^2 area and 4.006cm^3 volume. There is muscle and Fat Layer (Subcutaneous Tissue) Exposed exposed. There is no tunneling or undermining noted. There is a medium amount of serosanguineous drainage noted. The wound margin is well defined and not attached to the wound base. There is large (  67-100%) pink granulation within the wound bed. There is a small (1-33%) amount of necrotic tissue within the wound bed including Adherent Slough. Assessment Active Problems ICD-10 Pressure ulcer of left hip, stage 4 Disruption of external operation (surgical) wound, not elsewhere classified, subsequent encounter Plan Follow-up Appointments: Return Appointment in 2 weeks. Dressing Change Frequency: Wound #1 Left Trochanter: Change dressing three times week. - Monday, Wednesday and Friday by home health Skin Barriers/Peri-Wound Care: Wound #1 Left Trochanter: Other: - Crusting technique to periwound: (Nystatin powder, skin prep, powder, skin prep) Wound Cleansing: Wound #1 Left Trochanter: Clean wound with Wound Cleanser - or wound cleanser Primary Wound Dressing: Wound #1 Left Trochanter: Other: - Wet-to-dry today in  clinic. Home health to resume wound vac at home. Secondary Dressing: Wound #1 Left Trochanter: Foam Border Negative Presssure Wound Therapy: Wound #1 Left Trochanter: Medela Wound Vac continuously at 143mm/hg Black and White Foam combination - white foam to undermining Off-Loading: Other: - stay off of left hip to aid in offloading pressure. Home Health: [facility] skilled nursing for wound care. - [facility] Home The following medication(s) was prescribed: nystatin topical 100,000 unit/gram powder powder topical applied to the rash prior to application of the wound VAC 3 times per week starting 12/16/2019 1. I do think continue with the wound VAC would be ideal. 2. I would recommend dusting the area that appears to be yeasty with nystatin powder prior to application of the wound VAC in order to help with the rash and irritation here and prevent this from worsening. 3. I would also recommend appropriate and continued offloading to keep pressure off of this area. 4. I did discuss with the patient that this need to be changed 3 times a week regularly to keep from causing any setbacks or problems. We will see patient back for reevaluation in 1 week here in the clinic. If anything worsens or changes patient will contact our office for additional recommendations. Electronic Signature(s) Signed: 12/16/2019 11:07:40 AM By: Worthy Keeler PA-C Entered By: Worthy Keeler on 12/16/2019 11:07:40 -------------------------------------------------------------------------------- SuperBill Details Patient Name: Date of Service: Ivan [name], Ivan BERT W. 12/16/2019 Medical Record Number: [id number] Patient Account Number: [id number] Date of Birth/Sex: Treating RN: 08-08-1945 (74 y.o. [name] Primary Care Provider: [name] Other Clinician: Referring Provider: Treating Provider/Extender: Octaviano Glow, Carina Weeks in Treatment: 20 Diagnosis Coding ICD-10 Codes Code  Description [phone] Pressure ulcer of left hip, stage 4 T81.31XD Disruption of external operation (surgical) wound, not elsewhere classified, subsequent encounter Facility Procedures The patient participates with Medicare or their insurance follows the Medicare Facility Guidelines: CPT4 Code Description Modifier Quantity [id number] [place] VISIT-LEV 3 EST PT 1 Physician Procedures : CPT4 Code Description Modifier [id number] - WC PHYS LEVEL 4 - EST PT ICD-10 Diagnosis Description L89.224 Pressure ulcer of left hip, stage 4 T81.31XD Disruption of external operation (surgical) wound, not elsewhere classified, subsequent encounter Quantity: 1 Electronic Signature(s) Signed: 12/16/2019 5:21:32 PM By: Levan Hurst RN, BSN Signed: 12/16/2019 6:02:08 PM By: Worthy Keeler PA-C Previous Signature: 12/16/2019 11:07:52 AM Version By: Worthy Keeler PA-C Entered By: Levan Hurst on 12/16/2019 13:58:25

## 2019-12-17 NOTE — Progress Notes (Signed)
Ivan [name], Ivan [name] ([id number]) Visit Report for 12/16/2019 Arrival Information Details Patient Name: Date of Service: Ivan [name]. 12/16/2019 9:45 A M Medical Record Number: [id number] Patient Account Number: [id number] Date of Birth/Sex: Treating RN: 05/18/1945 (73 y.o. Ivan [name]) Carlene Coria Primary Care Ivan [name]: Ivan [name] Other Clinician: Referring Ivan [name]: Treating Korene Dula/Extender: Ivan [name], Ivan [name] in Treatment: 20 Visit Information History Since Last Visit All ordered tests and consults were completed: No Patient Arrived: Ambulatory Added or deleted any medications: No Arrival Time: 09:56 Any new allergies or adverse reactions: No Accompanied By: self Had a fall or experienced change in No Transfer Assistance: None activities of daily living that may affect Patient Identification Verified: Yes risk of falls: Secondary Verification Process Completed: Yes Signs or symptoms of abuse/neglect since last visito No Patient Requires Transmission-Based Precautions: No Hospitalized since last visit: No Patient Has Alerts: No Implantable device outside of the clinic excluding No cellular tissue based products placed in the center since last visit: Has Dressing in Place as Prescribed: Yes Pain Present Now: No Electronic Signature(s) Signed: 12/17/2019 4:42:20 PM By: Carlene Coria RN Entered By: Carlene Coria on 12/16/2019 10:07:10 -------------------------------------------------------------------------------- Clinic Level of Care Assessment Details Patient Name: Date of Service: Ivan [name]. 12/16/2019 9:45 A M Medical Record Number: [id number] Patient Account Number: [id number] Date of Birth/Sex: Treating RN: [birth year] (74 y.o. Ivan [name] Primary Care Ivan [name]: Ivan [name] Other Clinician: Referring Ivan [name]: Treating Ivan [name]/Extender: Ivan [name], Ivan [name] in Treatment: 20 Clinic Level of Care Assessment Items TOOL 4  Quantity Score X- 1 0 Use when only an EandM is performed on FOLLOW-UP visit ASSESSMENTS - Nursing Assessment / Reassessment X- 1 10 Reassessment of Co-morbidities (includes updates in patient status) X- 1 5 Reassessment of Adherence to Treatment Plan ASSESSMENTS - Wound and Skin A ssessment / Reassessment X - Simple Wound Assessment / Reassessment - one wound 1 5 []  - 0 Complex Wound Assessment / Reassessment - multiple wounds []  - 0 Dermatologic / Skin Assessment (not related to wound area) ASSESSMENTS - Focused Assessment []  - 0 Circumferential Edema Measurements - multi extremities []  - 0 Nutritional Assessment / Counseling / Intervention []  - 0 Lower Extremity Assessment (monofilament, tuning fork, pulses) []  - 0 Peripheral Arterial Disease Assessment (using hand held doppler) ASSESSMENTS - Ostomy and/or Continence Assessment and Care []  - 0 Incontinence Assessment and Management []  - 0 Ostomy Care Assessment and Management (repouching, etc.) PROCESS - Coordination of Care X - Simple Patient / Family Education for ongoing care 1 15 []  - 0 Complex (extensive) Patient / Family Education for ongoing care X- 1 10 Staff obtains Programmer, systems, Records, T Results / Process Orders est X- 1 10 Staff telephones HHA, Nursing Homes / Clarify orders / etc []  - 0 Routine Transfer to another Facility (non-emergent condition) []  - 0 Routine Hospital Admission (non-emergent condition) []  - 0 New Admissions / Biomedical engineer / Ordering NPWT Apligraf, etc. , []  - 0 Emergency Hospital Admission (emergent condition) X- 1 10 Simple Discharge Coordination []  - 0 Complex (extensive) Discharge Coordination PROCESS - Special Needs []  - 0 Pediatric / Minor Patient Management []  - 0 Isolation Patient Management []  - 0 Hearing / Language / Visual special needs []  - 0 Assessment of Community assistance (transportation, D/C planning, etc.) []  - 0 Additional assistance / Altered  mentation []  - 0 Support Surface(s) Assessment (bed, cushion, seat, etc.) INTERVENTIONS - Wound Cleansing / Measurement X - Simple Wound Cleansing -  one wound 1 5 []  - 0 Complex Wound Cleansing - multiple wounds X- 1 5 Wound Imaging (photographs - any number of wounds) []  - 0 Wound Tracing (instead of photographs) X- 1 5 Simple Wound Measurement - one wound []  - 0 Complex Wound Measurement - multiple wounds INTERVENTIONS - Wound Dressings X - Small Wound Dressing one or multiple wounds 1 10 []  - 0 Medium Wound Dressing one or multiple wounds []  - 0 Large Wound Dressing one or multiple wounds X- 1 5 Application of Medications - topical []  - 0 Application of Medications - injection INTERVENTIONS - Miscellaneous []  - 0 External ear exam []  - 0 Specimen Collection (cultures, biopsies, blood, body fluids, etc.) []  - 0 Specimen(s) / Culture(s) sent or taken to Lab for analysis []  - 0 Patient Transfer (multiple staff / Civil Service fast streamer / Similar devices) []  - 0 Simple Staple / Suture removal (25 or less) []  - 0 Complex Staple / Suture removal (26 or more) []  - 0 Hypo / Hyperglycemic Management (close monitor of Blood Glucose) []  - 0 Ankle / Brachial Index (ABI) - do not check if billed separately X- 1 5 Vital Signs Has the patient been seen at the hospital within the last three years: Yes Total Score: 100 Level Of Care: New/Established - Level 3 Electronic Signature(s) Signed: 12/16/2019 5:21:32 PM By: Ivan Hurst RN, BSN Entered By: Ivan [name] on 12/16/2019 13:58:09 -------------------------------------------------------------------------------- Encounter Discharge Information Details Patient Name: Date of Service: Ivan [name], Ivan BERT W. 12/16/2019 9:45 A M Medical Record Number: [id number] Patient Account Number: [id number] Date of Birth/Sex: Treating RN: 03/10/1946 (74 y.o. Ivan [name] Primary Care Ivan [name]: Ivan [name] Other Clinician: Referring  Ivan [name]: Treating Ivan [name]/Extender: Ivan [name], Ivan [name] in Treatment: 20 Encounter Discharge Information Items Discharge Condition: Stable Ambulatory Status: Ambulatory Discharge Destination: Home Transportation: Private Auto Accompanied By: self Schedule Follow-up Appointment: Yes Clinical Summary of Care: Patient Declined Electronic Signature(s) Signed: 12/16/2019 5:04:18 PM By: Baruch Gouty RN, BSN Entered By: Baruch Gouty on 12/16/2019 11:14:15 -------------------------------------------------------------------------------- [place] Details Patient Name: Date of Service: Ivan [name], Ivan BERT W. 12/16/2019 9:45 A M Medical Record Number: [id number] Patient Account Number: [id number] Date of Birth/Sex: Treating RN: 1945-06-29 (74 y.o. Ivan [name] Primary Care Tedi Hughson: Ivan [name] Other Clinician: Referring Deniya Craigo: Treating Constance Whittle/Extender: Ivan [name], Ivan [name] in Treatment: 20 Active Inactive Wound/Skin Impairment Nursing Diagnoses: Knowledge deficit related to ulceration/compromised skin integrity Goals: Patient/caregiver will verbalize understanding of skin care regimen Date Initiated: 07/29/2019 Target Resolution Date: 01/14/2020 Goal Status: Active Interventions: Assess patient/caregiver ability to obtain necessary supplies Assess patient/caregiver ability to perform ulcer/skin care regimen upon admission and as needed Provide education on ulcer and skin care Treatment Activities: Skin care regimen initiated : 07/29/2019 Topical wound management initiated : 07/29/2019 Notes: Electronic Signature(s) Signed: 12/16/2019 5:21:32 PM By: Ivan Hurst RN, BSN Entered By: Ivan [name] on 12/16/2019 13:57:35 -------------------------------------------------------------------------------- Pain Assessment Details Patient Name: Date of Service: Ivan [name], Ivan BERT W. 12/16/2019 9:45 A M Medical Record Number:  [id number] Patient Account Number: [id number] Date of Birth/Sex: Treating RN: 06-01-1945 (73 y.o. Oval Linsey Primary Care Pearlene Teat: Ivan [name] Other Clinician: Referring Riley Papin: Treating Solita Macadam/Extender: Ivan [name], Ivan [name] in Treatment: 20 Active Problems Location of Pain Severity and Description of Pain Patient Has Paino No Site Locations Pain Management and Medication Current Pain Management: Electronic Signature(s) Signed: 12/17/2019 4:42:20 PM By: Carlene Coria RN Entered By: Carlene Coria on 12/16/2019 10:07:39 -------------------------------------------------------------------------------- Patient/Caregiver  Education Details Patient Name: Date of Service: Ivan [name] 8/5/2021andnbsp9:45 A M Medical Record Number: [id number] Patient Account Number: [id number] Date of Birth/Gender: Treating RN: [birth year] (74 y.o. Ivan [name] Primary Care Physician: Ivan [name] Other Clinician: Referring Physician: Treating Physician/Extender: Raul Del [name] in Treatment: 20 Education Assessment Education Provided To: Patient Education Topics Provided Wound/Skin Impairment: Methods: Explain/Verbal Responses: State content correctly Electronic Signature(s) Signed: 12/16/2019 5:21:32 PM By: Ivan Hurst RN, BSN Entered By: Ivan [name] on 12/16/2019 13:57:46 -------------------------------------------------------------------------------- Wound Assessment Details Patient Name: Date of Service: Ivan [name], Ivan BERT W. 12/16/2019 9:45 A M Medical Record Number: [id number] Patient Account Number: [id number] Date of Birth/Sex: Treating RN: Dec 03, 1945 (73 y.o. Ivan [name]) Carlene Coria Primary Care Tywan Siever: Ivan [name] Other Clinician: Referring Karla Pavone: Treating Oliva Montecalvo/Extender: Ivan [name], Ivan [name] in Treatment: 20 Wound Status Wound Number: 1 Primary Pressure Ulcer Etiology: Wound Location: Left  Trochanter Secondary Open Surgical Wound Wounding Event: Surgical Injury Etiology: Date Acquired: 07/01/2019 Wound Open [name] Of Treatment: 20 Status: Clustered Wound: No Comorbid Cataracts, Coronary Artery Disease, Hypertension, Myocardial History: Infarction, Osteoarthritis, Osteomyelitis Photos Photo Uploaded By: Mikeal Hawthorne on 12/16/2019 13:59:26 Wound Measurements Length: (cm) 1.7 Width: (cm) 1.5 Depth: (cm) 2 Area: (cm) 2.003 Volume: (cm) 4.006 % Reduction in Area: 90.7% % Reduction in Volume: 86.7% Epithelialization: None Tunneling: No Undermining: No Wound Description Classification: Category/Stage IV Wound Margin: Well defined, not attached Exudate Amount: Medium Exudate Type: Serosanguineous Exudate Color: red, brown Foul Odor After Cleansing: No Slough/Fibrino Yes Wound Bed Granulation Amount: Large (67-100%) Exposed Structure Granulation Quality: Pink Fascia Exposed: No Necrotic Amount: Small (1-33%) Fat Layer (Subcutaneous Tissue) Exposed: Yes Necrotic Quality: Adherent Slough Tendon Exposed: No Muscle Exposed: Yes Necrosis of Muscle: No Joint Exposed: No Bone Exposed: No Treatment Notes Wound #1 (Left Trochanter) 2. Periwound Care Antifungal cream 3. Primary Dressing Applied Other primary dressing (specifiy in notes) 4. Secondary Dressing Foam Border Dressing Notes saline moistened gauze packing Electronic Signature(s) Signed: 12/17/2019 4:42:20 PM By: Carlene Coria RN Entered By: Carlene Coria on 12/16/2019 10:08:07 -------------------------------------------------------------------------------- Vitals Details Patient Name: Date of Service: Ivan [name], Ivan BERT W. 12/16/2019 9:45 A M Medical Record Number: [id number] Patient Account Number: [id number] Date of Birth/Sex: Treating RN: 03/29/1946 (73 y.o. Ivan [name]) Carlene Coria Primary Care Refujio Haymer: Ivan [name] Other Clinician: Referring Brewer Hitchman: Treating Jhayla Podgorski/Extender: Ivan [name], Ivan [name] in Treatment: 20 Vital Signs Time Taken: 10:07 Temperature (F): 98.1 Height (in): 70 Pulse (bpm): 60 Weight (lbs): 198 Respiratory Rate (breaths/min): 16 Body Mass Index (BMI): 28.4 Blood Pressure (mmHg): 100/65 Reference Range: 80 - 120 mg / dl Electronic Signature(s) Signed: 12/17/2019 4:42:20 PM By: Carlene Coria RN Entered By: Carlene Coria on 12/16/2019 10:07:33

## 2019-12-30 ENCOUNTER — Encounter (HOSPITAL_BASED_OUTPATIENT_CLINIC_OR_DEPARTMENT_OTHER): Payer: Medicare HMO | Admitting: Internal Medicine

## 2019-12-30 DIAGNOSIS — T8131XD Disruption of external operation (surgical) wound, not elsewhere classified, subsequent encounter: Secondary | ICD-10-CM | POA: Diagnosis not present

## 2019-12-30 NOTE — Progress Notes (Signed)
NAOKI, MIGLIACCIO ([id number]) Visit Report for 12/30/2019 Arrival Information Details Patient Name: Date of Service: Ivan [name]. 12/30/2019 9:30 A M Medical Record Number: [id number] Patient Account Number: [id number] Date of Birth/Sex: Treating RN: 04-14-1946 (74 y.o. [name] Primary Care Jeannemarie Sawaya: [name] Other Clinician: Referring Dvid Pendry: Treating Liann Spaeth/Extender: Fonnie Jarvis in Treatment: 43 Visit Information History Since Last Visit Added or deleted any medications: No Patient Arrived: Ambulatory Any new allergies or adverse reactions: No Arrival Time: 09:41 Had a fall or experienced change in No Accompanied By: self activities of daily living that may affect Transfer Assistance: None risk of falls: Patient Identification Verified: Yes Signs or symptoms of abuse/neglect since last visito No Secondary Verification Process Completed: Yes Hospitalized since last visit: No Patient Requires Transmission-Based Precautions: No Implantable device outside of the clinic excluding No Patient Has Alerts: No cellular tissue based products placed in the center since last visit: Has Dressing in Place as Prescribed: Yes Pain Present Now: No Electronic Signature(s) Signed: 12/30/2019 5:22:44 PM By: Kela Millin Entered By: Kela Millin on 12/30/2019 09:44:03 -------------------------------------------------------------------------------- Clinic Level of Care Assessment Details Patient Name: Date of Service: Ivan [name]. 12/30/2019 9:30 A M Medical Record Number: [id number] Patient Account Number: [id number] Date of Birth/Sex: Treating RN: 08-26-1945 (74 y.o. [name] Primary Care Maziah Keeling: [name] Other Clinician: Referring Carlis Blanchard: Treating Kemia Wendel/Extender: Fonnie Jarvis in Treatment: 22 Clinic Level of Care Assessment Items TOOL 4 Quantity Score X- 1 0 Use when only an  EandM is performed on FOLLOW-UP visit ASSESSMENTS - Nursing Assessment / Reassessment X- 1 10 Reassessment of Co-morbidities (includes updates in patient status) X- 1 5 Reassessment of Adherence to Treatment Plan ASSESSMENTS - Wound and Skin A ssessment / Reassessment X - Simple Wound Assessment / Reassessment - one wound 1 5 []  - 0 Complex Wound Assessment / Reassessment - multiple wounds []  - 0 Dermatologic / Skin Assessment (not related to wound area) ASSESSMENTS - Focused Assessment []  - 0 Circumferential Edema Measurements - multi extremities []  - 0 Nutritional Assessment / Counseling / Intervention []  - 0 Lower Extremity Assessment (monofilament, tuning fork, pulses) []  - 0 Peripheral Arterial Disease Assessment (using hand held doppler) ASSESSMENTS - Ostomy and/or Continence Assessment and Care []  - 0 Incontinence Assessment and Management []  - 0 Ostomy Care Assessment and Management (repouching, etc.) PROCESS - Coordination of Care X - Simple Patient / Family Education for ongoing care 1 15 []  - 0 Complex (extensive) Patient / Family Education for ongoing care X- 1 10 Staff obtains Consents, Records, T Results / Process Orders est X- 1 10 Staff telephones HHA, Nursing Homes / Clarify orders / etc []  - 0 Routine Transfer to another Facility (non-emergent condition) []  - 0 Routine Hospital Admission (non-emergent condition) []  - 0 New Admissions / Biomedical engineer / Ordering NPWT Apligraf, etc. , []  - 0 Emergency Hospital Admission (emergent condition) X- 1 10 Simple Discharge Coordination []  - 0 Complex (extensive) Discharge Coordination PROCESS - Special Needs []  - 0 Pediatric / Minor Patient Management []  - 0 Isolation Patient Management []  - 0 Hearing / Language / Visual special needs []  - 0 Assessment of Community assistance (transportation, D/C planning, etc.) []  - 0 Additional assistance / Altered mentation []  - 0 Support Surface(s)  Assessment (bed, cushion, seat, etc.) INTERVENTIONS - Wound Cleansing / Measurement X - Simple Wound Cleansing - one wound 1 5 []  - 0 Complex Wound Cleansing -  multiple wounds X- 1 5 Wound Imaging (photographs - any number of wounds) []  - 0 Wound Tracing (instead of photographs) X- 1 5 Simple Wound Measurement - one wound []  - 0 Complex Wound Measurement - multiple wounds INTERVENTIONS - Wound Dressings X - Small Wound Dressing one or multiple wounds 1 10 []  - 0 Medium Wound Dressing one or multiple wounds []  - 0 Large Wound Dressing one or multiple wounds X- 1 5 Application of Medications - topical []  - 0 Application of Medications - injection INTERVENTIONS - Miscellaneous []  - 0 External ear exam []  - 0 Specimen Collection (cultures, biopsies, blood, body fluids, etc.) []  - 0 Specimen(s) / Culture(s) sent or taken to Lab for analysis []  - 0 Patient Transfer (multiple staff / Civil Service fast streamer / Similar devices) []  - 0 Simple Staple / Suture removal (25 or less) []  - 0 Complex Staple / Suture removal (26 or more) []  - 0 Hypo / Hyperglycemic Management (close monitor of Blood Glucose) []  - 0 Ankle / Brachial Index (ABI) - do not check if billed separately X- 1 5 Vital Signs Has the patient been seen at the hospital within the last three years: Yes Total Score: 100 Level Of Care: New/Established - Level 3 Electronic Signature(s) Signed: 12/30/2019 5:36:57 PM By: Levan Hurst RN, BSN Entered By: Levan Hurst on 12/30/2019 11:05:03 -------------------------------------------------------------------------------- Encounter Discharge Information Details Patient Name: Date of Service: Ivan [name], [name] 12/30/2019 9:30 A M Medical Record Number: [id number] Patient Account Number: [id number] Date of Birth/Sex: Treating RN: 11-09-1945 (74 y.o. [name] Primary Care Chyla Schlender: [name] Other Clinician: Referring Jerrie Gullo: Treating Santresa Levett/Extender: Fonnie Jarvis in Treatment: 22 Encounter Discharge Information Items Discharge Condition: Stable Ambulatory Status: Ambulatory Discharge Destination: Home Transportation: Private Auto Accompanied By: self Schedule Follow-up Appointment: Yes Clinical Summary of Care: Patient Declined Electronic Signature(s) Signed: 12/30/2019 5:37:54 PM By: Baruch Gouty RN, BSN Entered By: Baruch Gouty on 12/30/2019 10:38:55 -------------------------------------------------------------------------------- Lower Extremity Assessment Details Patient Name: Date of Service: Ivan [name], [name] 12/30/2019 9:30 A M Medical Record Number: [id number] Patient Account Number: [id number] Date of Birth/Sex: Treating RN: 10/15/1945 (74 y.o. [name] Primary Care Ridwan Bondy: [name] Other Clinician: Referring Ceri Mayer: Treating Vernecia Umble/Extender: Ronnie Derby, [name] in Treatment: 22 Electronic Signature(s) Signed: 12/30/2019 5:22:44 PM By: Kela Millin Entered By: Kela Millin on 12/30/2019 09:45:33 -------------------------------------------------------------------------------- Multi Wound Chart Details Patient Name: Date of Service: Ivan [name], [name] 12/30/2019 9:30 A M Medical Record Number: [id number] Patient Account Number: [id number] Date of Birth/Sex: Treating RN: Jun 22, 1945 (74 y.o. [name] Primary Care Thurl Boen: [name] Other Clinician: Referring Thomas Rhude: Treating Alura Olveda/Extender: Ronnie Derby, [name] in Treatment: 22 Vital Signs Height(in): 70 Pulse(bpm): 50 Weight(lbs): 198 Blood Pressure(mmHg): 125/79 Body Mass Index(BMI): 28 Temperature(F): 98.3 Respiratory Rate(breaths/min): 16 Photos: [1:No Photos Left Trochanter] [N/A:N/A N/A] Wound Location: [1:Surgical Injury] [N/A:N/A] Wounding Event: [1:Pressure Ulcer] [N/A:N/A] Primary Etiology: [1:Open Surgical Wound] [N/A:N/A] Secondary Etiology:  [1:Cataracts, Coronary Artery Disease,] [N/A:N/A] Comorbid History: [1:Hypertension, Myocardial Infarction, Osteoarthritis, Osteomyelitis 07/01/2019] [N/A:N/A] Date Acquired: [1:22] [N/A:N/A] Weeks of Treatment: [1:Open] [N/A:N/A] Wound Status: [1:1.7x1.6x2.2] [N/A:N/A] Measurements L x W x D (cm) [1:2.136] [N/A:N/A] A (cm) : rea [1:4.7] [N/A:N/A] Volume (cm) : [1:90.10%] [N/A:N/A] % Reduction in A rea: [1:84.40%] [N/A:N/A] % Reduction in Volume: [1:10] Starting Position 1 (o'clock): [1:2] Ending Position 1 (o'clock): [1:4.7] Maximum Distance 1 (cm): [1:Yes] [N/A:N/A] Undermining: [1:Category/Stage IV] [N/A:N/A] Classification: [1:Medium] [N/A:N/A] Exudate A mount: [1:Serous] [N/A:N/A]  Exudate Type: [1:amber] [N/A:N/A] Exudate Color: [1:Well defined, not attached] [N/A:N/A] Wound Margin: [1:Medium (34-66%)] [N/A:N/A] Granulation A mount: [1:Pink, Pale] [N/A:N/A] Granulation Quality: [1:Medium (34-66%)] [N/A:N/A] Necrotic A mount: [1:Fat Layer (Subcutaneous Tissue)] [N/A:N/A] Exposed Structures: [1:Exposed: Yes Muscle: Yes Fascia: No Tendon: No Joint: No Bone: No None] [N/A:N/A] Treatment Notes Wound #1 (Left Trochanter) 2. Periwound Care Skin Prep 3. Primary Dressing Applied Other primary dressing (specifiy in notes) 4. Secondary Dressing Foam Border Dressing Notes saline moistened gauze packing Electronic Signature(s) Signed: 12/30/2019 5:36:57 PM By: Levan Hurst RN, BSN Signed: 12/30/2019 5:57:44 PM By: [name] MD Entered By: [name] on 12/30/2019 10:51:03 -------------------------------------------------------------------------------- Multi-Disciplinary Care Plan Details Patient Name: Date of Service: Ivan [name], Larina Earthly. 12/30/2019 9:30 A M Medical Record Number: [id number] Patient Account Number: [id number] Date of Birth/Sex: Treating RN: 03-01-1946 (74 y.o. [name] Primary Care Ayzia Day: [name] Other Clinician: Referring  Manilla Strieter: Treating Ayme Short/Extender: Ronnie Derby, [name] in Treatment: 22 Active Inactive Wound/Skin Impairment Nursing Diagnoses: Knowledge deficit related to ulceration/compromised skin integrity Goals: Patient/caregiver will verbalize understanding of skin care regimen Date Initiated: 07/29/2019 Target Resolution Date: 01/14/2020 Goal Status: Active Interventions: Assess patient/caregiver ability to obtain necessary supplies Assess patient/caregiver ability to perform ulcer/skin care regimen upon admission and as needed Provide education on ulcer and skin care Treatment Activities: Skin care regimen initiated : 07/29/2019 Topical wound management initiated : 07/29/2019 Notes: Electronic Signature(s) Signed: 12/30/2019 5:36:57 PM By: Levan Hurst RN, BSN Entered By: Levan Hurst on 12/30/2019 10:16:09 -------------------------------------------------------------------------------- Pain Assessment Details Patient Name: Date of Service: Ivan [name], [name] 12/30/2019 9:30 A M Medical Record Number: [id number] Patient Account Number: [id number] Date of Birth/Sex: Treating RN: 08-Mar-1946 (74 y.o. [name] Primary Care Dawnita Molner: [name] Other Clinician: Referring Alexandrina Fiorini: Treating Naiah Donahoe/Extender: Ronnie Derby, [name] in Treatment: 22 Active Problems Location of Pain Severity and Description of Pain Patient Has Paino No Site Locations Pain Management and Medication Current Pain Management: Electronic Signature(s) Signed: 12/30/2019 5:22:44 PM By: Kela Millin Entered By: Kela Millin on 12/30/2019 09:45:27 -------------------------------------------------------------------------------- Patient/Caregiver Education Details Patient Name: Date of Service: [place], [name] 8/19/2021andnbsp9:30 A M Medical Record Number: [id number] Patient Account Number: [id number] Date of Birth/Gender: Treating RN: [birth year]  (74 y.o. [name] Primary Care Physician: [name] Other Clinician: Referring Physician: Treating Physician/Extender: Fonnie Jarvis in Treatment: 22 Education Assessment Education Provided To: Patient Education Topics Provided Wound/Skin Impairment: Methods: Explain/Verbal Responses: State content correctly [employer]) Signed: 12/30/2019 5:36:57 PM By: Levan Hurst RN, BSN Entered By: Levan Hurst on 12/30/2019 10:16:19 -------------------------------------------------------------------------------- Wound Assessment Details Patient Name: Date of Service: Ivan [name], [name] 12/30/2019 9:30 A M Medical Record Number: [id number] Patient Account Number: [id number] Date of Birth/Sex: Treating RN: April 19, 1946 (74 y.o. [name] Primary Care Prudencio Velazco: [name] Other Clinician: Referring Bevin Mayall: Treating Cecila Satcher/Extender: Ronnie Derby, [name] in Treatment: 22 Wound Status Wound Number: 1 Primary Pressure Ulcer Etiology: Wound Location: Left Trochanter Secondary Open Surgical Wound Wounding Event: Surgical Injury Etiology: Date Acquired: 07/01/2019 Wound Open Weeks Of Treatment: 22 Status: Clustered Wound: No Comorbid Cataracts, Coronary Artery Disease, Hypertension, Myocardial History: Infarction, Osteoarthritis, Osteomyelitis Wound Measurements Length: (cm) 1.7 Width: (cm) 1.6 Depth: (cm) 2.2 Area: (cm) 2.136 Volume: (cm) 4.7 % Reduction in Area: 90.1% % Reduction in Volume: 84.4% Epithelialization: None Tunneling: No Undermining: Yes Starting Position (o'clock): 10 Ending Position (o'clock): 2 Maximum Distance: (cm) 4.7 Wound Description Classification: Category/Stage IV Wound Margin:  Well defined, not attached Exudate Amount: Medium Exudate Type: Serous Exudate Color: amber Foul Odor After Cleansing: No Slough/Fibrino Yes Wound Bed Granulation Amount: Medium (34-66%)  Exposed Structure Granulation Quality: Pink, Pale Fascia Exposed: No Necrotic Amount: Medium (34-66%) Fat Layer (Subcutaneous Tissue) Exposed: Yes Necrotic Quality: Adherent Slough Tendon Exposed: No Muscle Exposed: Yes Necrosis of Muscle: No Joint Exposed: No Bone Exposed: No Treatment Notes Wound #1 (Left Trochanter) 2. Periwound Care Skin Prep 3. Primary Dressing Applied Other primary dressing (specifiy in notes) 4. Secondary Dressing Foam Border Dressing Notes saline moistened gauze packing Electronic Signature(s) Signed: 12/30/2019 5:22:44 PM By: Kela Millin Entered By: Kela Millin on 12/30/2019 09:51:17 -------------------------------------------------------------------------------- Vitals Details Patient Name: Date of Service: Ivan [name], [name] 12/30/2019 9:30 A M Medical Record Number: [id number] Patient Account Number: [id number] Date of Birth/Sex: Treating RN: 08-07-1945 (74 y.o. [name] Primary Care Adama Ivins: [name] Other Clinician: Referring Caysen Whang: Treating Miriana Gaertner/Extender: Ronnie Derby, [name] in Treatment: 22 Vital Signs Time Taken: 09:45 Temperature (F): 98.3 Height (in): 70 Pulse (bpm): 76 Weight (lbs): 198 Respiratory Rate (breaths/min): 16 Body Mass Index (BMI): 28.4 Blood Pressure (mmHg): 125/79 Reference Range: 80 - 120 mg / dl Electronic Signature(s) Signed: 12/30/2019 5:22:44 PM By: Kela Millin Entered By: Kela Millin on 12/30/2019 09:45:21

## 2019-12-30 NOTE — Progress Notes (Signed)
Ivan, [name] ([id number]) Visit Report for 12/30/2019 HPI Details Patient Name: Date of Service: Ivan [name]. 12/30/2019 9:30 A M Medical Record Number: [id number] Patient Account Number: [id number] Date of Birth/Sex: Treating RN: 27-Jan-1946 (74 y.o. 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 He required admission to hospital from 1/5 through 1/9 with Covid pneumonia. At that point he apparently left AMA. He was noted to have a left hip bruise at the time of discharge. I suspect this really was a deep tissue injury. He was readmitted from 1/13 through 1//27 with altered mental status Covid pneumonia and delirium. At that point he had a left hip and thigh wound which had worsened. Felt to be a pressure injury. He was seen by general surgery who recommended normal saline wet-to-dry twice daily he did not have a debridement. After he left at that point he was incapacitated by increasing lumbar pain. An MRI on 2/9 showed discitis and osteomyelitis at L1/2. He asked aspiration of 1 cm fluid however I think this culture negative. He was also noted at this point to have a very large wound over the left greater trochanter. He was taken to the OR by Dr. Marla Roe for debridement with ACell placement. He was placed in a wound VAC. A culture at that point showed Enterococcus and Enterobacter. An MRI on 3/15 showed no evidence of left hip septic arthritis or osteomyelitis. Extensive subcutaneous edema within the visualized soft tissue of the left thigh and prominent left inguinal lymph nodes likely reactive. He was started on IV cefepime and vancomycin for 6 weeks. [facility]  There I gather the wound developed the necrotic surface. He was seen by the wound care doctor in house who correctly pointed out that a wound VAC would not do anything with a necrotic surface. Since then he has been using Santyl The patient is now at home. He was on vancomycin and cefepime. He developed hives and a generalized rash and was seen in the ER on 3/15 by Dr. Cain Sieve recommendation he was changed to meropenem however he also apparently developed hives after this. He says the original hives stopped after the cefepime was stopped and follow-up hives only developed after the meropenem. He is only taken 1 dose of the fear of the meropenem and now is on vancomycin. 3/25; the patient was seen by Dr. Linus Salmons on 3/22. Because of intolerances to meropenem which I communicated with Dr. De Burrs about he is now on daptomycin and Cipro until sometime in early April. He seems to be tolerating the antibiotics well. We are using Santyl back by wet-to-dry dressings. And making some improvement in the general surface of this very large wound 4/8; we are using Santyl back by wet-to-dry. We are making progress in terms of prepping the wound for a wound VAC. Still necrotic material requiring debridement. 4/16; we are using Santyl back by wet-to-dry. Not convinced that his wife is packing this properly there is a 6 cm tunnel at 12:00. We brought this to attention. Still requiring mechanical debridement 4/29; 2-week follow-up. In general the wound is better however there is considerable tunneling tunneling to the greater trochanter itself. There is still some debris in the deeper parts of this wound I  did not do mechanical debridement on this today. I am going to change the dressing to quarter strength Dakin's wet-to-dry. We will put a wound VAC through his insurance. His wife has had brain damage from a previous meningioma surgery. She is not comfortable around strangers in her home. The patient is concerned about  this 5/18; patient is using quarter strength Dakin's wet to dry. The problem is a deep pressure area over the left greater trochanter. There was some question from her intake nurses that this is actually come in and some question raised about whether he would require a wound VAC or not. The issue with the wound VAC was complicated predominantly by the patient's reluctance to allow people into his home i.e. home health although he tells me he has this mostly "sorted out" 7/1; this is a patient that I have not seen in almost 6 weeks. Apparently canceled several appointments related to family problems deaths in the family etc. He is wife has ophthalmologic surgery next week. I had been trying to get a wound VAC ordered for this deep probing wound over the left greater trochanter but so far I think he is simply doing a Dakin's wet-to-dry and our intake nurse did not think there was any Dakin's on it. I have a note from Dr. Tommy Medal of infectious disease on 6/30. He has now been off doxycycline and ciprofloxacin for several weeks. He has no undue pain or drainage. He also had discitis and osteomyelitis at L1-L2 with a 1 cm left psoas abscess as well as some increased signal at T10/T11. He is not having any problems related to this. 12/16/2019 on evaluation today patient's wound actually is showing some signs of bit of improvement which is good news. Fortunately there is no signs of active infection at this time which is good news. He tells me that the nurses have been coming to change this 3 times a week and doing a great job. He told the intake nurse that they had only been coming out once a week. I am not really sure what is going on with the discrepancy and stories here. Nonetheless I do believe that he needs to have this changed 3 times a week on a regular basis he does seem to have a little bit of a fungal infection around the wound. 8/19; 2-week follow-up. I have not seen this patient in over 6 weeks.  There is been a dramatic improvement in the overall wound volume. Circumference of the wound is now small although there is still considerable undermining from roughly 9-2 o'clock maximum 1:00 at roughly 4.7 cm. There is no purulent drainage. He has some irritation around the wound on the normal skin. They are using miconazole on this. Electronic Signature(s) Signed: 12/30/2019 5:57:44 PM By: [name] MD Entered By: [name] on 12/30/2019 10:52:20 -------------------------------------------------------------------------------- Physical Exam Details Patient Name: Date of Service: [name], [name] 12/30/2019 9:30 A M Medical Record Number: [id number] Patient Account Number: [id number] Date of Birth/Sex: Treating RN: 12-25-1945 (74 y.o. [name] Primary Care Provider: [name] Other Clinician: Referring Provider: Treating Provider/Extender: Ronnie Derby, [name] in Treatment: 22 Constitutional Sitting or standing Blood Pressure is within target range for patient.. Pulse regular and within target range for patient.Marland Kitchen Respirations regular, non-labored and within target range.. Temperature is normal and within the target range for the patient.Marland Kitchen Appears in no distress. Respiratory work of breathing is normal. Psychiatric appears at normal baseline. Notes Wound exam; the  patient's overall wound volume is quite a bit better than when I last saw this. Circumference is small unfortunately there is still considerable undermining maximal at roughly 1:00 at 4.7 cm. There is still exposed fat in the depth of this beyond this the overall wound is hard to see. The surrounding skin looks somewhat irritated but overall I think a lot of this is scar tissue perhaps with some surrounding Candida Electronic Signature(s) Signed: 12/30/2019 5:57:44 PM By: [name] MD Entered By: [name] on 12/30/2019  10:56:34 -------------------------------------------------------------------------------- Physician Orders Details Patient Name: Date of Service: [name], [name] 12/30/2019 9:30 A M Medical Record Number: [id number] Patient Account Number: [id number] Date of Birth/Sex: Treating RN: [birth year] (74 y.o. [name] Primary Care Provider: [name] Other Clinician: Referring Provider: Treating Provider/Extender: Fonnie Jarvis in Treatment: 22 Verbal / Phone Orders: No Diagnosis Coding ICD-10 Coding Code Description [phone] Pressure ulcer of left hip, stage 4 T81.31XD Disruption of external operation (surgical) wound, not elsewhere classified, subsequent encounter Follow-up Appointments Return Appointment in 2 weeks. Dressing Change Frequency Wound #1 Left Trochanter Change dressing three times week. - Monday, Wednesday and Friday by home health Skin Barriers/Peri-Wound Care Wound #1 Left Trochanter Other: - Crusting technique to periwound: (Nystatin powder, skin prep, powder, skin prep) Wound Cleansing Wound #1 Left Trochanter Clean wound with Wound Cleanser - or wound cleanser Primary Wound Dressing Wound #1 Left Trochanter Other: - Wet-to-dry today in clinic. Home health to resume wound vac at home. Secondary Dressing Wound #1 Left Trochanter Foam Border Negative Presssure Wound Therapy Wound #1 Left Trochanter Medela Wound Vac continuously at 152mm/hg Black and White Foam combination - white foam to undermining Off-Loading Other: - stay off of left hip to aid in offloading pressure. [facility] for wound care. - [facility] Electronic Signature(s) Signed: 12/30/2019 5:36:57 PM By: Levan Hurst RN, BSN Signed: 12/30/2019 5:57:44 PM By: [name] MD Entered By: Levan Hurst on 12/30/2019 10:15:54 -------------------------------------------------------------------------------- Problem List  Details Patient Name: Date of Service: [name], [name] 12/30/2019 9:30 A M Medical Record Number: [id number] Patient Account Number: [id number] Date of Birth/Sex: Treating RN: 1945-06-30 (74 y.o. [name] Primary Care Provider: [name] Other Clinician: Referring Provider: Treating Provider/Extender: Fonnie Jarvis in Treatment: 22 Active Problems ICD-10 Encounter Code Description Active Date MDM Diagnosis L89.224 Pressure ulcer of left hip, stage 4 08/05/2019 No Yes T81.31XD Disruption of external operation (surgical) wound, not elsewhere classified, 07/29/2019 No Yes subsequent encounter Inactive Problems Resolved Problems Electronic Signature(s) Signed: 12/30/2019 5:57:44 PM By: [name] MD Entered By: [name] on 12/30/2019 10:50:55 -------------------------------------------------------------------------------- Progress Note Details Patient Name: Date of Service: [name], [name] 12/30/2019 9:30 A M Medical Record Number: [id number] Patient Account Number: [id number] Date of Birth/Sex: Treating RN: [birth year] (74 y.o. 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 He required admission to hospital from 1/5 through 1/9 with Covid pneumonia. At that point he apparently left AMA. He was noted to have a left hip bruise at the time of discharge. I suspect this really was a deep tissue injury. He was readmitted from 1/13 through 1//27 with altered mental status Covid pneumonia and delirium. At that point he had a  left hip and thigh wound which had worsened. Felt to be a pressure injury. He was seen by general surgery who recommended normal saline  wet-to-dry twice daily he did not have a debridement. After he left at that point he was incapacitated by increasing lumbar pain. An MRI on 2/9 showed discitis and osteomyelitis at L1/2. He asked aspiration of 1 cm fluid however I think this culture negative. He was also noted at this point to have a very large wound over the left greater trochanter. He was taken to the OR by Dr. Marla Roe for debridement with ACell placement. He was placed in a wound VAC. A culture at that point showed Enterococcus and Enterobacter. An MRI on 3/15 showed no evidence of left hip septic arthritis or osteomyelitis. Extensive subcutaneous edema within the visualized soft tissue of the left thigh and prominent left inguinal lymph nodes likely reactive. He was started on IV cefepime and vancomycin for 6 weeks. He was discharged to [place] facility as the patient. There I gather the wound developed the necrotic surface. He was seen by the wound care doctor in house who correctly pointed out that a wound VAC would not do anything with a necrotic surface. Since then he has been using Santyl The patient is now at home. He was on vancomycin and cefepime. He developed hives and a generalized rash and was seen in the ER on 3/15 by Dr. Cain Sieve recommendation he was changed to meropenem however he also apparently developed hives after this. He says the original hives stopped after the cefepime was stopped and follow-up hives only developed after the meropenem. He is only taken 1 dose of the fear of the meropenem and now is on vancomycin. 3/25; the patient was seen by Dr. Linus Salmons on 3/22. Because of intolerances to meropenem which I communicated with Dr. De Burrs about he is now on daptomycin and Cipro until sometime in early April. He seems to be tolerating the antibiotics well. We are using Santyl back by wet-to-dry dressings. And making some improvement in the general surface of this very large wound 4/8; we are using Santyl back  by wet-to-dry. We are making progress in terms of prepping the wound for a wound VAC. Still necrotic material requiring debridement. 4/16; we are using Santyl back by wet-to-dry. Not convinced that his wife is packing this properly there is a 6 cm tunnel at 12:00. We brought this to attention. Still requiring mechanical debridement 4/29; 2-week follow-up. In general the wound is better however there is considerable tunneling tunneling to the greater trochanter itself. There is still some debris in the deeper parts of this wound I did not do mechanical debridement on this today. I am going to change the dressing to quarter strength Dakin's wet-to-dry. We will put a wound VAC through his insurance. His wife has had brain damage from a previous meningioma surgery. She is not comfortable around strangers in her home. The patient is concerned about this 5/18; patient is using quarter strength Dakin's wet to dry. The problem is a deep pressure area over the left greater trochanter. There was some question from her intake nurses that this is actually come in and some question raised about whether he would require a wound VAC or not. The issue with the wound VAC was complicated predominantly by the patient's reluctance to allow people into his home i.e. home health although he tells me he has this mostly "sorted out" 7/1; this is a patient that I have not seen  in almost 6 weeks. Apparently canceled several appointments related to family problems deaths in the family etc. He is wife has ophthalmologic surgery next week. I had been trying to get a wound VAC ordered for this deep probing wound over the left greater trochanter but so far I think he is simply doing a Dakin's wet-to-dry and our intake nurse did not think there was any Dakin's on it. I have a note from Dr. Tommy Medal of infectious disease on 6/30. He has now been off doxycycline and ciprofloxacin for several weeks. He has no undue pain or drainage. He  also had discitis and osteomyelitis at L1-L2 with a 1 cm left psoas abscess as well as some increased signal at T10/T11. He is not having any problems related to this. 12/16/2019 on evaluation today patient's wound actually is showing some signs of bit of improvement which is good news. Fortunately there is no signs of active infection at this time which is good news. He tells me that the nurses have been coming to change this 3 times a week and doing a great job. He told the intake nurse that they had only been coming out once a week. I am not really sure what is going on with the discrepancy and stories here. Nonetheless I do believe that he needs to have this changed 3 times a week on a regular basis he does seem to have a little bit of a fungal infection around the wound. 8/19; 2-week follow-up. I have not seen this patient in over 6 weeks. There is been a dramatic improvement in the overall wound volume. Circumference of the wound is now small although there is still considerable undermining from roughly 9-2 o'clock maximum 1:00 at roughly 4.7 cm. There is no purulent drainage. He has some irritation around the wound on the normal skin. They are using miconazole on this. Objective Constitutional Sitting or standing Blood Pressure is within target range for patient.. Pulse regular and within target range for patient.Marland Kitchen Respirations regular, non-labored and within target range.. Temperature is normal and within the target range for the patient.Marland Kitchen Appears in no distress. Vitals Time Taken: 9:45 AM, Height: 70 in, Weight: 198 lbs, BMI: 28.4, Temperature: 98.3 F, Pulse: 76 bpm, Respiratory Rate: 16 breaths/min, Blood Pressure: 125/79 mmHg. Respiratory work of breathing is normal. Psychiatric appears at normal baseline. General Notes: Wound exam; the patient's overall wound volume is quite a bit better than when I last saw this. Circumference is small unfortunately there is still considerable  undermining maximal at roughly 1:00 at 4.7 cm. There is still exposed fat in the depth of this beyond this the overall wound is hard to see. The surrounding skin looks somewhat irritated but overall I think a lot of this is scar tissue perhaps with some surrounding Candida Integumentary (Hair, Skin) Wound #1 status is Open. Original cause of wound was Surgical Injury. The wound is located on the Left Trochanter. The wound measures 1.7cm length x 1.6cm width x 2.2cm depth; 2.136cm^2 area and 4.7cm^3 volume. There is muscle and Fat Layer (Subcutaneous Tissue) Exposed exposed. There is no tunneling noted, however, there is undermining starting at 10:00 and ending at 2:00 with a maximum distance of 4.7cm. There is a medium amount of serous drainage noted. The wound margin is well defined and not attached to the wound base. There is medium (34-66%) pink, pale granulation within the wound bed. There is a medium (34-66%) amount of necrotic tissue within the wound bed including Adherent  Slough. Assessment Active Problems ICD-10 Pressure ulcer of left hip, stage 4 Disruption of external operation (surgical) wound, not elsewhere classified, subsequent encounter Plan Follow-up Appointments: Return Appointment in 2 weeks. Dressing Change Frequency: Wound #1 Left Trochanter: Change dressing three times week. - Monday, Wednesday and Friday by home health Skin Barriers/Peri-Wound Care: Wound #1 Left Trochanter: Other: - Crusting technique to periwound: (Nystatin powder, skin prep, powder, skin prep) Wound Cleansing: Wound #1 Left Trochanter: Clean wound with Wound Cleanser - or wound cleanser Primary Wound Dressing: Wound #1 Left Trochanter: Other: - Wet-to-dry today in clinic. Home health to resume wound vac at home. Secondary Dressing: Wound #1 Left Trochanter: Foam Border Negative Presssure Wound Therapy: Wound #1 Left Trochanter: Medela Wound Vac continuously at 196mm/hg Black and White Foam  combination - white foam to undermining Off-Loading: Other: - stay off of left hip to aid in offloading pressure. Home Health: [facility] for wound care. - [facility] 1. There is no doubt that we need to continue the wound VAC which is a medilin Vac 2 perhaps some degree of surrounding Candida, using miconazole. Insurance would not cover nystatin powder. 3. I think some of the erythema here is scar tissue 4. I see no evidence currently of overt infection coming from the wound area. 5. Carefully follow the direct depth and the maximal tunneling Electronic Signature(s) Signed: 12/30/2019 5:57:44 PM By: [name] MD Entered By: [name] on 12/30/2019 10:58:05 -------------------------------------------------------------------------------- SuperBill Details Patient Name: Date of Service: [name], [name] 12/30/2019 Medical Record Number: [id number] Patient Account Number: [id number] Date of Birth/Sex: Treating RN: 1945-12-16 (74 y.o. [name] Primary Care Provider: [name] Other Clinician: Referring Provider: Treating Provider/Extender: Ronnie Derby, [name] in Treatment: 22 Diagnosis Coding ICD-10 Codes Code Description [phone] Pressure ulcer of left hip, stage 4 T81.31XD Disruption of external operation (surgical) wound, not elsewhere classified, subsequent encounter Facility Procedures The patient participates with Medicare or their insurance follows the Medicare Facility Guidelines: CPT4 Code Description Modifier Quantity [id number] [place] VISIT-LEV 3 EST PT 1 Physician Procedures : CPT4 Code Description Modifier [id number] - WC PHYS LEVEL 3 - EST PT ICD-10 Diagnosis Description L89.224 Pressure ulcer of left hip, stage 4 T81.31XD Disruption of external operation (surgical) wound, not elsewhere classified, subsequent encounter Quantity: 1 Electronic Signature(s) Signed: 12/30/2019 5:36:57 PM By:  Levan Hurst RN, BSN Signed: 12/30/2019 5:57:44 PM By: [name] MD Entered By: Levan Hurst on 12/30/2019 11:05:18

## 2020-01-01 ENCOUNTER — Other Ambulatory Visit: Payer: Self-pay | Admitting: Nurse Practitioner

## 2020-01-01 DIAGNOSIS — I10 Essential (primary) hypertension: Secondary | ICD-10-CM

## 2020-01-13 ENCOUNTER — Encounter (HOSPITAL_BASED_OUTPATIENT_CLINIC_OR_DEPARTMENT_OTHER): Payer: Medicare HMO | Attending: Internal Medicine | Admitting: Internal Medicine

## 2020-01-13 DIAGNOSIS — I2510 Atherosclerotic heart disease of native coronary artery without angina pectoris: Secondary | ICD-10-CM | POA: Insufficient documentation

## 2020-01-13 DIAGNOSIS — L89224 Pressure ulcer of left hip, stage 4: Secondary | ICD-10-CM | POA: Diagnosis not present

## 2020-01-13 DIAGNOSIS — Z8616 Personal history of COVID-19: Secondary | ICD-10-CM | POA: Diagnosis not present

## 2020-01-13 DIAGNOSIS — I252 Old myocardial infarction: Secondary | ICD-10-CM | POA: Diagnosis not present

## 2020-01-13 DIAGNOSIS — I10 Essential (primary) hypertension: Secondary | ICD-10-CM | POA: Insufficient documentation

## 2020-01-13 DIAGNOSIS — X58XXXD Exposure to other specified factors, subsequent encounter: Secondary | ICD-10-CM | POA: Insufficient documentation

## 2020-01-13 DIAGNOSIS — H269 Unspecified cataract: Secondary | ICD-10-CM | POA: Insufficient documentation

## 2020-01-13 DIAGNOSIS — M1990 Unspecified osteoarthritis, unspecified site: Secondary | ICD-10-CM | POA: Insufficient documentation

## 2020-01-13 DIAGNOSIS — B372 Candidiasis of skin and nail: Secondary | ICD-10-CM | POA: Insufficient documentation

## 2020-01-13 DIAGNOSIS — T8131XD Disruption of external operation (surgical) wound, not elsewhere classified, subsequent encounter: Secondary | ICD-10-CM | POA: Diagnosis not present

## 2020-01-13 NOTE — Progress Notes (Signed)
ANOOP, HEMMER ([id number]) Visit Report for 01/13/2020 HPI Details Patient Name: Date of Service: Ivan [name]. 01/13/2020 9:30 A M Medical Record Number: [id number] Patient Account Number: [id number] Date of Birth/Sex: Treating RN: April 09, 1946 (74 y.o. 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 He required admission to hospital from 1/5 through 1/9 with Covid pneumonia. At that point he apparently left AMA. He was noted to have a left hip bruise at the time of discharge. I suspect this really was a deep tissue injury. He was readmitted from 1/13 through 1//27 with altered mental status Covid pneumonia and delirium. At that point he had a left hip and thigh wound which had worsened. Felt to be a pressure injury. He was seen by general surgery who recommended normal saline wet-to-dry twice daily he did not have a debridement. After he left at that point he was incapacitated by increasing lumbar pain. An MRI on 2/9 showed discitis and osteomyelitis at L1/2. He asked aspiration of 1 cm fluid however I think this culture negative. He was also noted at this point to have a very large wound over the left greater trochanter. He was taken to the OR by Dr. Marla Roe for debridement with ACell placement. He was placed in a wound VAC. A culture at that point showed Enterococcus and Enterobacter. An MRI on 3/15 showed no evidence of left hip septic arthritis or osteomyelitis. Extensive subcutaneous edema within the visualized soft tissue of the left thigh and prominent left inguinal lymph nodes likely reactive. He was started on IV cefepime and vancomycin for 6 weeks. He was discharged to [facility] as the patient.  There I gather the wound developed the necrotic surface. He was seen by the wound care doctor in house who correctly pointed out that a wound VAC would not do anything with a necrotic surface. Since then he has been using Santyl The patient is now at home. He was on vancomycin and cefepime. He developed hives and a generalized rash and was seen in the ER on 3/15 by Dr. Cain Sieve recommendation he was changed to meropenem however he also apparently developed hives after this. He says the original hives stopped after the cefepime was stopped and follow-up hives only developed after the meropenem. He is only taken 1 dose of the fear of the meropenem and now is on vancomycin. 3/25; the patient was seen by Dr. Linus Salmons on 3/22. Because of intolerances to meropenem which I communicated with Dr. De Burrs about he is now on daptomycin and Cipro until sometime in early April. He seems to be tolerating the antibiotics well. We are using Santyl back by wet-to-dry dressings. And making some improvement in the general surface of this very large wound 4/8; we are using Santyl back by wet-to-dry. We are making progress in terms of prepping the wound for a wound VAC. Still necrotic material requiring debridement. 4/16; we are using Santyl back by wet-to-dry. Not convinced that his wife is packing this properly there is a 6 cm tunnel at 12:00. We brought this to attention. Still requiring mechanical debridement 4/29; 2-week follow-up. In general the wound is better however there is considerable tunneling tunneling to the greater trochanter itself. There is still some debris in the deeper parts of this wound I  did not do mechanical debridement on this today. I am going to change the dressing to quarter strength Dakin's wet-to-dry. We will put a wound VAC through his insurance. His wife has had brain damage from a previous meningioma surgery. She is not comfortable around strangers in her home. The patient is concerned about  this 5/18; patient is using quarter strength Dakin's wet to dry. The problem is a deep pressure area over the left greater trochanter. There was some question from her intake nurses that this is actually come in and some question raised about whether he would require a wound VAC or not. The issue with the wound VAC was complicated predominantly by the patient's reluctance to allow people into his home i.e. home health although he tells me he has this mostly "sorted out" 7/1; this is a patient that I have not seen in almost 6 weeks. Apparently canceled several appointments related to family problems deaths in the family etc. He is wife has ophthalmologic surgery next week. I had been trying to get a wound VAC ordered for this deep probing wound over the left greater trochanter but so far I think he is simply doing a Dakin's wet-to-dry and our intake nurse did not think there was any Dakin's on it. I have a note from Dr. Tommy Medal of infectious disease on 6/30. He has now been off doxycycline and ciprofloxacin for several weeks. He has no undue pain or drainage. He also had discitis and osteomyelitis at L1-L2 with a 1 cm left psoas abscess as well as some increased signal at T10/T11. He is not having any problems related to this. 12/16/2019 on evaluation today patient's wound actually is showing some signs of bit of improvement which is good news. Fortunately there is no signs of active infection at this time which is good news. He tells me that the nurses have been coming to change this 3 times a week and doing a great job. He told the intake nurse that they had only been coming out once a week. I am not really sure what is going on with the discrepancy and stories here. Nonetheless I do believe that he needs to have this changed 3 times a week on a regular basis he does seem to have a little bit of a fungal infection around the wound. 8/19; 2-week follow-up. I have not seen this patient in over 6 weeks.  There is been a dramatic improvement in the overall wound volume. Circumference of the wound is now small although there is still considerable undermining from roughly 9-2 o'clock maximum 1:00 at roughly 4.7 cm. There is no purulent drainage. He has some irritation around the wound on the normal skin. They are using miconazole on this. 9/2; 2-week follow-up. Dimensions of the wound circumference look quite good. There is tunneling at 4.3 cm at 1:00 versus 4.7 last time. The area continues to look like it is contracting with the wound VAC and I think the treatment of choice here will be continuing to the wound VAC as long as it seems practical. There is no evidence of infection. As far as I am aware he is offloading this aggressively Electronic Signature(s) Signed: 01/13/2020 5:13:24 PM By: [name] MD Entered By: [name] on 01/13/2020 11:03:58 -------------------------------------------------------------------------------- Physical Exam Details Patient Name: Date of Service: [name], [name] 01/13/2020 9:30 A M Medical Record Number: [id number] Patient Account Number: [id number] Date of Birth/Sex: Treating RN: 1945-05-20 (74 y.o. [name] Primary Care  Provider: [name] Other Clinician: Referring Provider: Treating Provider/Extender: Ronnie Derby, [name] in Treatment: 24 Constitutional Sitting or standing Blood Pressure is within target range for patient.. Pulse regular and within target range for patient.Marland Kitchen Respirations regular, non-labored and within target range.. Temperature is normal and within the target range for the patient.Marland Kitchen Appears in no distress. Notes Wound exam; I think the patient's overall wound volume continues to contract. This is still a substantial tunneling wound however I get the feeling that this is filling in. There is no evidence of surrounding infection no tenderness no purulent Electronic Signature(s) Signed: 01/13/2020  5:13:24 PM By: [name] MD Entered By: [name] on 01/13/2020 11:04:39 -------------------------------------------------------------------------------- Physician Orders Details Patient Name: Date of Service: [name], [name] 01/13/2020 9:30 A M Medical Record Number: [id number] Patient Account Number: [id number] Date of Birth/Sex: Treating RN: 04-29-1946 (74 y.o. [name] Primary Care Provider: [name] Other Clinician: Referring Provider: Treating Provider/Extender: Fonnie Jarvis in Treatment: 24 Verbal / Phone Orders: No Diagnosis Coding ICD-10 Coding Code Description [id number] Pressure ulcer of left hip, stage 4 T81.31XD Disruption of external operation (surgical) wound, not elsewhere classified, subsequent encounter Follow-up Appointments Return Appointment in 2 weeks. Dressing Change Frequency Wound #1 Left Trochanter Change dressing three times week. - Monday, Wednesday and Friday by home health Skin Barriers/Peri-Wound Care Wound #1 Left Trochanter Other: - Crusting technique to periwound: (Nystatin powder, skin prep, powder, skin prep) Wound Cleansing Wound #1 Left Trochanter Clean wound with Wound Cleanser - or wound cleanser Primary Wound Dressing Wound #1 Left Trochanter Other: - Wet-to-dry today in clinic. Home health to resume wound vac at home. Secondary Dressing Wound #1 Left Trochanter Foam Border Negative Presssure Wound Therapy Wound #1 Left Trochanter Medela Wound Vac continuously at 149mm/hg Black and White Foam combination - white foam to undermining Off-Loading Other: - stay off of left hip to aid in offloading pressure. [facility] for wound care. - [facility] Electronic Signature(s) Signed: 01/13/2020 5:08:15 PM By: Levan Hurst RN, BSN Signed: 01/13/2020 5:13:24 PM By: [name] MD Entered By: Levan Hurst on 01/13/2020  09:41:04 -------------------------------------------------------------------------------- Problem List Details Patient Name: Date of Service: [name], [name] 01/13/2020 9:30 A M Medical Record Number: [id number] Patient Account Number: [id number] Date of Birth/Sex: Treating RN: January 26, 1946 (74 y.o. [name] Primary Care Provider: [name] Other Clinician: Referring Provider: Treating Provider/Extender: Fonnie Jarvis in Treatment: 24 Active Problems ICD-10 Encounter Code Description Active Date MDM Diagnosis L89.224 Pressure ulcer of left hip, stage 4 08/05/2019 No Yes T81.31XD Disruption of external operation (surgical) wound, not elsewhere classified, 07/29/2019 No Yes subsequent encounter Inactive Problems Resolved Problems Electronic Signature(s) Signed: 01/13/2020 5:13:24 PM By: [name] MD Entered By: [name] on 01/13/2020 11:02:46 -------------------------------------------------------------------------------- Progress Note Details Patient Name: Date of Service: [name], [name] 01/13/2020 9:30 A M Medical Record Number: [id number] Patient Account Number: [id number] Date of Birth/Sex: Treating RN: October 30, 1945 (74 y.o. 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 He required admission to hospital from 1/5 through 1/9 with Covid pneumonia. At that point he apparently left AMA. He was noted to have a left hip bruise at the time of discharge. I suspect  this really was a deep tissue injury. He was readmitted from 1/13 through 1//27 with altered mental status Covid pneumonia and delirium. At that point he had a left hip and thigh wound which  had worsened. Felt to be a pressure injury. He was seen by general surgery who recommended normal saline wet-to-dry twice daily he did not have a debridement. After he left at that point he was incapacitated by increasing lumbar pain. An MRI on 2/9 showed discitis and osteomyelitis at L1/2. He asked aspiration of 1 cm fluid however I think this culture negative. He was also noted at this point to have a very large wound over the left greater trochanter. He was taken to the OR by Dr. Marla Roe for debridement with ACell placement. He was placed in a wound VAC. A culture at that point showed Enterococcus and Enterobacter. An MRI on 3/15 showed no evidence of left hip septic arthritis or osteomyelitis. Extensive subcutaneous edema within the visualized soft tissue of the left thigh and prominent left inguinal lymph nodes likely reactive. He was started on IV cefepime and vancomycin for 6 weeks. He was discharged to [facility] facility as the patient. There I gather the wound developed the necrotic surface. He was seen by the wound care doctor in house who correctly pointed out that a wound VAC would not do anything with a necrotic surface. Since then he has been using Santyl The patient is now at home. He was on vancomycin and cefepime. He developed hives and a generalized rash and was seen in the ER on 3/15 by Dr. Cain Sieve recommendation he was changed to meropenem however he also apparently developed hives after this. He says the original hives stopped after the cefepime was stopped and follow-up hives only developed after the meropenem. He is only taken 1 dose of the fear of the meropenem and now is on vancomycin. 3/25; the patient was seen by Dr. Linus Salmons on 3/22. Because of intolerances to meropenem which I communicated with Dr. De Burrs about he is now on daptomycin and Cipro until sometime in early April. He seems to be tolerating the antibiotics well. We are using Santyl back by wet-to-dry dressings.  And making some improvement in the general surface of this very large wound 4/8; we are using Santyl back by wet-to-dry. We are making progress in terms of prepping the wound for a wound VAC. Still necrotic material requiring debridement. 4/16; we are using Santyl back by wet-to-dry. Not convinced that his wife is packing this properly there is a 6 cm tunnel at 12:00. We brought this to attention. Still requiring mechanical debridement 4/29; 2-week follow-up. In general the wound is better however there is considerable tunneling tunneling to the greater trochanter itself. There is still some debris in the deeper parts of this wound I did not do mechanical debridement on this today. I am going to change the dressing to quarter strength Dakin's wet-to-dry. We will put a wound VAC through his insurance. His wife has had brain damage from a previous meningioma surgery. She is not comfortable around strangers in her home. The patient is concerned about this 5/18; patient is using quarter strength Dakin's wet to dry. The problem is a deep pressure area over the left greater trochanter. There was some question from her intake nurses that this is actually come in and some question raised about whether he would require a wound VAC or not. The issue with the wound VAC was complicated predominantly by the patient's reluctance to  allow people into his home i.e. home health although he tells me he has this mostly "sorted out" 7/1; this is a patient that I have not seen in almost 6 weeks. Apparently canceled several appointments related to family problems deaths in the family etc. He is wife has ophthalmologic surgery next week. I had been trying to get a wound VAC ordered for this deep probing wound over the left greater trochanter but so far I think he is simply doing a Dakin's wet-to-dry and our intake nurse did not think there was any Dakin's on it. I have a note from Dr. Tommy Medal of infectious disease on 6/30.  He has now been off doxycycline and ciprofloxacin for several weeks. He has no undue pain or drainage. He also had discitis and osteomyelitis at L1-L2 with a 1 cm left psoas abscess as well as some increased signal at T10/T11. He is not having any problems related to this. 12/16/2019 on evaluation today patient's wound actually is showing some signs of bit of improvement which is good news. Fortunately there is no signs of active infection at this time which is good news. He tells me that the nurses have been coming to change this 3 times a week and doing a great job. He told the intake nurse that they had only been coming out once a week. I am not really sure what is going on with the discrepancy and stories here. Nonetheless I do believe that he needs to have this changed 3 times a week on a regular basis he does seem to have a little bit of a fungal infection around the wound. 8/19; 2-week follow-up. I have not seen this patient in over 6 weeks. There is been a dramatic improvement in the overall wound volume. Circumference of the wound is now small although there is still considerable undermining from roughly 9-2 o'clock maximum 1:00 at roughly 4.7 cm. There is no purulent drainage. He has some irritation around the wound on the normal skin. They are using miconazole on this. 9/2; 2-week follow-up. Dimensions of the wound circumference look quite good. There is tunneling at 4.3 cm at 1:00 versus 4.7 last time. The area continues to look like it is contracting with the wound VAC and I think the treatment of choice here will be continuing to the wound VAC as long as it seems practical. There is no evidence of infection. As far as I am aware he is offloading this aggressively Objective Constitutional Sitting or standing Blood Pressure is within target range for patient.. Pulse regular and within target range for patient.Marland Kitchen Respirations regular, non-labored and within target range.. Temperature is  normal and within the target range for the patient.Marland Kitchen Appears in no distress. Vitals Time Taken: 9:56 AM, Height: 70 in, Weight: 198 lbs, BMI: 28.4, Temperature: 98.5 F, Pulse: 73 bpm, Respiratory Rate: 16 breaths/min, Blood Pressure: 112/70 mmHg. General Notes: Wound exam; I think the patient's overall wound volume continues to contract. This is still a substantial tunneling wound however I get the feeling that this is filling in. There is no evidence of surrounding infection no tenderness no purulent Integumentary (Hair, Skin) Wound #1 status is Open. Original cause of wound was Surgical Injury. The wound is located on the Left Trochanter. The wound measures 2cm length x 2cm width x 2cm depth; 3.142cm^2 area and 6.283cm^3 volume. There is muscle and Fat Layer (Subcutaneous Tissue) exposed. There is no tunneling noted, however, there is undermining starting at 7:00 and ending at  1:00 with a maximum distance of 4.5cm. There is a medium amount of serous drainage noted. The wound margin is well defined and not attached to the wound base. There is small (1-33%) pink, pale granulation within the wound bed. There is a large (67-100%) amount of necrotic tissue within the wound bed including Adherent Slough. Assessment Active Problems ICD-10 Pressure ulcer of left hip, stage 4 Disruption of external operation (surgical) wound, not elsewhere classified, subsequent encounter Plan Follow-up Appointments: Return Appointment in 2 weeks. Dressing Change Frequency: Wound #1 Left Trochanter: Change dressing three times week. - Monday, Wednesday and Friday by home health Skin Barriers/Peri-Wound Care: Wound #1 Left Trochanter: Other: - Crusting technique to periwound: (Nystatin powder, skin prep, powder, skin prep) Wound Cleansing: Wound #1 Left Trochanter: Clean wound with Wound Cleanser - or wound cleanser Primary Wound Dressing: Wound #1 Left Trochanter: Other: - Wet-to-dry today in clinic. Home  health to resume wound vac at home. Secondary Dressing: Wound #1 Left Trochanter: Foam Border Negative Presssure Wound Therapy: Wound #1 Left Trochanter: Medela Wound Vac continuously at 121mm/hg Black and White Foam combination - white foam to undermining Off-Loading: Other: - stay off of left hip to aid in offloading pressure. Home Health: [facility] for wound care. - [facility] 1. Continuing with the wound VAC 2. Some irritation around the periwound I believe they are using nystatin powder oral and topical antifungal 3. We continue to use the 1:00 tunneling is an indication of improvement in the depth of this. Today at 4.3 cm Electronic Signature(s) Signed: 01/13/2020 5:13:24 PM By: [name] MD Entered By: [name] on 01/13/2020 11:05:21 -------------------------------------------------------------------------------- SuperBill Details Patient Name: Date of Service: [name], [name] 01/13/2020 Medical Record Number: [id number] Patient Account Number: [id number] Date of Birth/Sex: Treating RN: 07-18-1945 (74 y.o. [name] Primary Care Provider: [name] Other Clinician: Referring Provider: Treating Provider/Extender: Ronnie Derby, [name] in Treatment: 24 Diagnosis Coding ICD-10 Codes Code Description [phone] Pressure ulcer of left hip, stage 4 T81.31XD Disruption of external operation (surgical) wound, not elsewhere classified, subsequent encounter Facility Procedures The patient participates with Medicare or their insurance follows the Medicare Facility Guidelines: CPT4 Code Description Modifier Quantity [id number] [facility] VISIT-LEV 3 EST PT 1 Physician Procedures : CPT4 Code Description Modifier [id number] - WC PHYS LEVEL 3 - EST PT ICD-10 Diagnosis Description L89.224 Pressure ulcer of left hip, stage 4 T81.31XD Disruption of external operation (surgical) wound, not elsewhere classified, subsequent  encounter Quantity: 1 Electronic Signature(s) Signed: 01/13/2020 5:13:24 PM By: [name] MD Entered By: [name] on 01/13/2020 11:05:40

## 2020-01-19 NOTE — Progress Notes (Signed)
MELQUISEDEC, JOURNEY ([id number]) Visit Report for 01/13/2020 Arrival Information Details Patient Name: Date of Service: Ivan [name]. 01/13/2020 9:30 A M Medical Record Number: [id number] Patient Account Number: [id number] Date of Birth/Sex: Treating RN: 22-May-1945 (73 y.o. [name]) Carlene Coria Primary Care [name] [name]: [name] Other Clinician: Referring Kenney Going: Treating Sherree Shankman/Extender: Fonnie Jarvis in Treatment: 24 Visit Information History Since Last Visit All ordered tests and consults were completed: No Patient Arrived: Ambulatory Added or deleted any medications: No Arrival Time: 09:52 Any new allergies or adverse reactions: No Accompanied By: self Had a fall or experienced change in No Transfer Assistance: None activities of daily living that may affect Patient Identification Verified: Yes risk of falls: Secondary Verification Process Completed: Yes Signs or symptoms of abuse/neglect since last visito No Patient Requires Transmission-Based Precautions: No Hospitalized since last visit: No Patient Has Alerts: No Implantable device outside of the clinic excluding No cellular tissue based products placed in the center since last visit: Has Dressing in Place as Prescribed: Yes Has Compression in Place as Prescribed: No Pain Present Now: Yes Electronic Signature(s) Signed: 01/19/2020 5:14:09 PM By: Carlene Coria RN Entered By: Carlene Coria on 01/13/2020 09:56:07 -------------------------------------------------------------------------------- Clinic Level of Care Assessment Details Patient Name: Date of Service: Ivan [name]. 01/13/2020 9:30 A M Medical Record Number: [id number] Patient Account Number: [id number] Date of Birth/Sex: Treating RN: [birth year] (74 y.o. [name] Primary Care Zanyla Klebba: [name] Other Clinician: Referring Jaquita Bessire: Treating Mickelle Goupil/Extender: Fonnie Jarvis in Treatment: 24 Clinic  Level of Care Assessment Items TOOL 4 Quantity Score X- 1 0 Use when only an EandM is performed on FOLLOW-UP visit ASSESSMENTS - Nursing Assessment / Reassessment X- 1 10 Reassessment of Co-morbidities (includes updates in patient status) X- 1 5 Reassessment of Adherence to Treatment Plan ASSESSMENTS - Wound and Skin A ssessment / Reassessment X - Simple Wound Assessment / Reassessment - one wound 1 5 []  - 0 Complex Wound Assessment / Reassessment - multiple wounds []  - 0 Dermatologic / Skin Assessment (not related to wound area) ASSESSMENTS - Focused Assessment []  - 0 Circumferential Edema Measurements - multi extremities []  - 0 Nutritional Assessment / Counseling / Intervention []  - 0 Lower Extremity Assessment (monofilament, tuning fork, pulses) []  - 0 Peripheral Arterial Disease Assessment (using hand held doppler) ASSESSMENTS - Ostomy and/or Continence Assessment and Care []  - 0 Incontinence Assessment and Management []  - 0 Ostomy Care Assessment and Management (repouching, etc.) PROCESS - Coordination of Care X - Simple Patient / Family Education for ongoing care 1 15 []  - 0 Complex (extensive) Patient / Family Education for ongoing care X- 1 10 Staff obtains Programmer, systems, Records, T Results / Process Orders est X- 1 10 Staff telephones HHA, Nursing Homes / Clarify orders / etc []  - 0 Routine Transfer to another Facility (non-emergent condition) []  - 0 Routine Hospital Admission (non-emergent condition) []  - 0 New Admissions / Biomedical engineer / Ordering NPWT Apligraf, etc. , []  - 0 Emergency Hospital Admission (emergent condition) X- 1 10 Simple Discharge Coordination []  - 0 Complex (extensive) Discharge Coordination PROCESS - Special Needs []  - 0 Pediatric / Minor Patient Management []  - 0 Isolation Patient Management []  - 0 Hearing / Language / Visual special needs []  - 0 Assessment of Community assistance (transportation, D/C planning,  etc.) []  - 0 Additional assistance / Altered mentation []  - 0 Support Surface(s) Assessment (bed, cushion, seat, etc.) INTERVENTIONS - Wound Cleansing / Measurement X -  Simple Wound Cleansing - one wound 1 5 []  - 0 Complex Wound Cleansing - multiple wounds X- 1 5 Wound Imaging (photographs - any number of wounds) []  - 0 Wound Tracing (instead of photographs) X- 1 5 Simple Wound Measurement - one wound []  - 0 Complex Wound Measurement - multiple wounds INTERVENTIONS - Wound Dressings X - Small Wound Dressing one or multiple wounds 1 10 []  - 0 Medium Wound Dressing one or multiple wounds []  - 0 Large Wound Dressing one or multiple wounds []  - 0 Application of Medications - topical []  - 0 Application of Medications - injection INTERVENTIONS - Miscellaneous []  - 0 External ear exam []  - 0 Specimen Collection (cultures, biopsies, blood, body fluids, etc.) []  - 0 Specimen(s) / Culture(s) sent or taken to Lab for analysis []  - 0 Patient Transfer (multiple staff / Civil Service fast streamer / Similar devices) []  - 0 Simple Staple / Suture removal (25 or less) []  - 0 Complex Staple / Suture removal (26 or more) []  - 0 Hypo / Hyperglycemic Management (close monitor of Blood Glucose) []  - 0 Ankle / Brachial Index (ABI) - do not check if billed separately X- 1 5 Vital Signs Has the patient been seen at the hospital within the last three years: Yes Total Score: 95 Level Of Care: New/Established - Level 3 Electronic Signature(s) Signed: 01/13/2020 5:08:15 PM By: Levan Hurst RN, BSN Entered By: Levan Hurst on 01/13/2020 10:27:30 -------------------------------------------------------------------------------- Encounter Discharge Information Details Patient Name: Date of Service: Ivan [name], Ivan BERT W. 01/13/2020 9:30 A M Medical Record Number: [id number] Patient Account Number: [id number] Date of Birth/Sex: Treating RN: October 31, 1945 (74 y.o. [name] Primary Care Heith Haigler: [name] Other Clinician: Referring Keziah Drotar: Treating Kathi Dohn/Extender: Fonnie Jarvis in Treatment: 24 Encounter Discharge Information Items Discharge Condition: Stable Ambulatory Status: Ambulatory Discharge Destination: Home Transportation: Private Auto Accompanied By: self Schedule Follow-up Appointment: Yes Clinical Summary of Care: Patient Declined Electronic Signature(s) Signed: 01/14/2020 2:45:56 PM By: Baruch Gouty RN, BSN Entered By: Baruch Gouty on 01/13/2020 10:36:39 -------------------------------------------------------------------------------- Multi Wound Chart Details Patient Name: Date of Service: Ivan [name], Ivan BERT W. 01/13/2020 9:30 A M Medical Record Number: [id number] Patient Account Number: [id number] Date of Birth/Sex: Treating RN: [birth year] (74 y.o. [name] Primary Care Nathanel Tallman: [name] Other Clinician: Referring Miranda Frese: Treating Zeb Rawl/Extender: Ronnie Derby, [name] in Treatment: 24 Vital Signs Height(in): 70 Pulse(bpm): 21 Weight(lbs): 198 Blood Pressure(mmHg): 112/70 Body Mass Index(BMI): 28 Temperature(F): 98.5 Respiratory Rate(breaths/min): 16 Photos: [1:No Photos Left Trochanter] [N/A:N/A N/A] Wound Location: [1:Surgical Injury] [N/A:N/A] Wounding Event: [1:Pressure Ulcer] [N/A:N/A] Primary Etiology: [1:Open Surgical Wound] [N/A:N/A] Secondary Etiology: [1:Cataracts, Coronary Artery Disease, N/A] Comorbid History: [1:Hypertension, Myocardial Infarction, Osteoarthritis, Osteomyelitis 07/01/2019] [N/A:N/A] Date Acquired: [1:24] [N/A:N/A] Weeks of Treatment: [1:Open] [N/A:N/A] Wound Status: [1:2x2x2] [N/A:N/A] Measurements L x W x D (cm) [1:3.142] [N/A:N/A] A (cm) : rea [1:6.283] [N/A:N/A] Volume (cm) : [1:85.40%] [N/A:N/A] % Reduction in A rea: [1:79.20%] [N/A:N/A] % Reduction in Volume: [1:7] Starting Position 1 (o'clock): [1:1] Ending Position 1 (o'clock): [1:4.5] Maximum  Distance 1 (cm): [1:Yes] [N/A:N/A] Undermining: [1:Category/Stage IV] [N/A:N/A] Classification: [1:Medium] [N/A:N/A] Exudate A mount: [1:Serous] [N/A:N/A] Exudate Type: [1:amber] [N/A:N/A] Exudate Color: [1:Well defined, not attached] [N/A:N/A] Wound Margin: [1:Small (1-33%)] [N/A:N/A] Granulation A mount: [1:Pink, Pale] [N/A:N/A] Granulation Quality: [1:Large (67-100%)] [N/A:N/A] Necrotic A mount: [1:Fat Layer (Subcutaneous Tissue): Yes N/A] Exposed Structures: [1:Muscle: Yes Fascia: No Tendon: No Joint: No Bone: No None] [N/A:N/A] Treatment Notes Wound #1 (Left Trochanter) 3. Primary  Dressing Applied Other primary dressing (specifiy in notes) 4. Secondary Dressing Foam Border Dressing Notes saline moistened gauze packing Electronic Signature(s) Signed: 01/13/2020 5:08:15 PM By: Levan Hurst RN, BSN Signed: 01/13/2020 5:13:24 PM By: [name] MD Entered By: [name] on 01/13/2020 11:02:57 -------------------------------------------------------------------------------- [place] Details Patient Name: Date of Service: Ivan [name], Ivan BERT W. 01/13/2020 9:30 A M Medical Record Number: [id number] Patient Account Number: [id number] Date of Birth/Sex: Treating RN: Sep 17, 1945 (74 y.o. [name] Primary Care Naome Brigandi: [name] Other Clinician: Referring Alyvia Derk: Treating Myeshia Fojtik/Extender: Ronnie Derby, [name] in Treatment: 24 Active Inactive Wound/Skin Impairment Nursing Diagnoses: Knowledge deficit related to ulceration/compromised skin integrity Goals: Patient/caregiver will verbalize understanding of skin care regimen Date Initiated: 07/29/2019 Target Resolution Date: 02/11/2020 Goal Status: Active Interventions: Assess patient/caregiver ability to obtain necessary supplies Assess patient/caregiver ability to perform ulcer/skin care regimen upon admission and as needed Provide education on ulcer and skin  care Treatment Activities: Skin care regimen initiated : 07/29/2019 Topical wound management initiated : 07/29/2019 Notes: Electronic Signature(s) Signed: 01/13/2020 5:08:15 PM By: Levan Hurst RN, BSN Entered By: Levan Hurst on 01/13/2020 09:41:53 -------------------------------------------------------------------------------- Pain Assessment Details Patient Name: Date of Service: Ivan [name], Ivan BERT W. 01/13/2020 9:30 A M Medical Record Number: [id number] Patient Account Number: [id number] Date of Birth/Sex: Treating RN: 07-16-1945 (73 y.o. Oval Linsey Primary Care Aja Bolander: [name] Other Clinician: Referring Yilia Sacca: Treating Nhan Qualley/Extender: Ronnie Derby, [name] in Treatment: 24 Active Problems Location of Pain Severity and Description of Pain Patient Has Paino Yes Site Locations With Dressing Change: Yes Duration of the Pain. Constant / Intermittento Intermittent How Long Does it Lasto Hours: Minutes: 15 Rate the pain. Current Pain Level: 2 Worst Pain Level: 4 Least Pain Level: 0 Tolerable Pain Level: 5 Character of Pain Describe the Pain: Burning Pain Management and Medication Current Pain Management: Medication: Yes Cold Application: No Rest: Yes Massage: No Activity: No T.E.N.S.: No Heat Application: No Leg drop or elevation: No Is the Current Pain Management Adequate: Inadequate How does your wound impact your activities of daily livingo Sleep: No Bathing: No Appetite: No Relationship With Others: No Bladder Continence: No Emotions: No Bowel Continence: No Work: No Toileting: No Drive: No Dressing: No Hobbies: No Electronic Signature(s) Signed: 01/19/2020 5:14:09 PM By: Carlene Coria RN Entered By: Carlene Coria on 01/13/2020 09:57:15 -------------------------------------------------------------------------------- Patient/Caregiver Education Details Patient Name: Date of Service: [place] M, Ivan BERT W. 9/2/2021andnbsp9:30  A M Medical Record Number: [id number] Patient Account Number: [id number] Date of Birth/Gender: Treating RN: 28-Oct-1945 (74 y.o. [name] Primary Care Physician: [name] Other Clinician: Referring Physician: Treating Physician/Extender: Fonnie Jarvis in Treatment: 24 Education Assessment Education Provided To: Patient Education Topics Provided Wound/Skin Impairment: Methods: Explain/Verbal Responses: State content correctly [employer]) Signed: 01/13/2020 5:08:15 PM By: Levan Hurst RN, BSN Entered By: Levan Hurst on 01/13/2020 09:42:20 -------------------------------------------------------------------------------- Wound Assessment Details Patient Name: Date of Service: Ivan [name], Ivan BERT W. 01/13/2020 9:30 A M Medical Record Number: [id number] Patient Account Number: [id number] Date of Birth/Sex: Treating RN: 05/04/1946 (73 y.o. Oval Linsey Primary Care Tiffay Pinette: [name] Other Clinician: Referring Trey Gulbranson: Treating Ermal Haberer/Extender: Ronnie Derby, [name] in Treatment: 24 Wound Status Wound Number: 1 Primary Pressure Ulcer Etiology: Wound Location: Left Trochanter Secondary Open Surgical Wound Wounding Event: Surgical Injury Etiology: Date Acquired: 07/01/2019 Wound Open Weeks Of Treatment: 24 Status: Clustered Wound: No Comorbid Cataracts, Coronary Artery Disease, Hypertension, Myocardial History: Infarction, Osteoarthritis, Osteomyelitis Photos Photo Uploaded  ByMikeal Hawthorne on 01/18/2020 13:53:57 Wound Measurements Length: (cm) 2 Width: (cm) 2 Depth: (cm) 2 Area: (cm) 3.142 Volume: (cm) 6.283 % Reduction in Area: 85.4% % Reduction in Volume: 79.2% Epithelialization: None Tunneling: No Undermining: Yes Starting Position (o'clock): 7 Ending Position (o'clock): 1 Maximum Distance: (cm) 4.5 Wound Description Classification: Category/Stage IV Wound Margin: Well defined, not  attached Exudate Amount: Medium Exudate Type: Serous Exudate Color: amber Foul Odor After Cleansing: No Slough/Fibrino Yes Wound Bed Granulation Amount: Small (1-33%) Exposed Structure Granulation Quality: Pink, Pale Fascia Exposed: No Necrotic Amount: Large (67-100%) Fat Layer (Subcutaneous Tissue) Exposed: Yes Necrotic Quality: Adherent Slough Tendon Exposed: No Muscle Exposed: Yes Necrosis of Muscle: No Joint Exposed: No Bone Exposed: No Treatment Notes Wound #1 (Left Trochanter) 3. Primary Dressing Applied Other primary dressing (specifiy in notes) 4. Secondary Dressing Foam Border Dressing Notes saline moistened gauze packing Electronic Signature(s) Signed: 01/19/2020 5:14:09 PM By: Carlene Coria RN Entered By: Carlene Coria on 01/13/2020 10:03:02 -------------------------------------------------------------------------------- Vitals Details Patient Name: Date of Service: Ivan [name], Ivan BERT W. 01/13/2020 9:30 A M Medical Record Number: [id number] Patient Account Number: [id number] Date of Birth/Sex: Treating RN: 1945-10-15 (73 y.o. [name]) Carlene Coria Primary Care [place] Kuang: [name] Other Clinician: Referring Sage Kopera: Treating Chamia Schmutz/Extender: Ronnie Derby, [name] in Treatment: 24 Vital Signs Time Taken: 09:56 Temperature (F): 98.5 Height (in): 70 Pulse (bpm): 73 Weight (lbs): 198 Respiratory Rate (breaths/min): 16 Body Mass Index (BMI): 28.4 Blood Pressure (mmHg): 112/70 Reference Range: 80 - 120 mg / dl Electronic Signature(s) Signed: 01/19/2020 5:14:09 PM By: Carlene Coria RN Entered By: Carlene Coria on 01/13/2020 09:56:36

## 2020-01-27 ENCOUNTER — Other Ambulatory Visit: Payer: Self-pay

## 2020-01-27 ENCOUNTER — Encounter (HOSPITAL_BASED_OUTPATIENT_CLINIC_OR_DEPARTMENT_OTHER): Payer: Medicare HMO | Admitting: Internal Medicine

## 2020-01-27 DIAGNOSIS — L89224 Pressure ulcer of left hip, stage 4: Secondary | ICD-10-CM | POA: Diagnosis not present

## 2020-01-27 NOTE — Progress Notes (Signed)
KEVONTAY, BURKS ([id number]) Visit Report for 01/27/2020 Arrival Information Details Patient Name: Date of Service: Ivan [name]. 01/27/2020 9:30 A M Medical Record Number: [id number] Patient Account Number: [id number] Date of Birth/Sex: Treating RN: 1945/06/09 (74 y.o. [name] Primary Care Amritpal Shropshire: [name] Other Clinician: Referring Birtie Fellman: Treating Viren Lebeau/Extender: Fonnie Jarvis in Treatment: 60 Visit Information History Since Last Visit Added or deleted any medications: No Patient Arrived: Ambulatory Any new allergies or adverse reactions: No Arrival Time: 09:34 Had a fall or experienced change in No Accompanied By: self activities of daily living that may affect Transfer Assistance: None risk of falls: Patient Identification Verified: Yes Signs or symptoms of abuse/neglect since last visito No Secondary Verification Process Completed: Yes Hospitalized since last visit: No Patient Requires Transmission-Based Precautions: No Implantable device outside of the clinic excluding No Patient Has Alerts: No cellular tissue based products placed in the center since last visit: Has Dressing in Place as Prescribed: Yes Pain Present Now: No Electronic Signature(s) Signed: 01/27/2020 4:34:39 PM By: Kela Millin Entered By: Kela Millin on 01/27/2020 09:37:02 -------------------------------------------------------------------------------- Clinic Level of Care Assessment Details Patient Name: Date of Service: Ivan [name]. 01/27/2020 9:30 A M Medical Record Number: [id number] Patient Account Number: [id number] Date of Birth/Sex: Treating RN: 02/27/1946 (74 y.o. [name] Primary Care Cashawn Yanko: [name] Other Clinician: Referring Tresten Pantoja: Treating Lalani Winkles/Extender: Fonnie Jarvis in Treatment: 26 Clinic Level of Care Assessment Items TOOL 4 Quantity Score X- 1 0 Use when only an  EandM is performed on FOLLOW-UP visit ASSESSMENTS - Nursing Assessment / Reassessment X- 1 10 Reassessment of Co-morbidities (includes updates in patient status) X- 1 5 Reassessment of Adherence to Treatment Plan ASSESSMENTS - Wound and Skin A ssessment / Reassessment []  - 0 Simple Wound Assessment / Reassessment - one wound X- 1 5 Complex Wound Assessment / Reassessment - multiple wounds X- 1 10 Dermatologic / Skin Assessment (not related to wound area) ASSESSMENTS - Focused Assessment []  - 0 Circumferential Edema Measurements - multi extremities X- 1 10 Nutritional Assessment / Counseling / Intervention []  - 0 Lower Extremity Assessment (monofilament, tuning fork, pulses) []  - 0 Peripheral Arterial Disease Assessment (using hand held doppler) ASSESSMENTS - Ostomy and/or Continence Assessment and Care []  - 0 Incontinence Assessment and Management []  - 0 Ostomy Care Assessment and Management (repouching, etc.) PROCESS - Coordination of Care []  - 0 Simple Patient / Family Education for ongoing care X- 1 20 Complex (extensive) Patient / Family Education for ongoing care X- 1 10 Staff obtains Programmer, systems, Records, T Results / Process Orders est X- 1 10 Staff telephones HHA, Nursing Homes / Clarify orders / etc []  - 0 Routine Transfer to another Facility (non-emergent condition) []  - 0 Routine Hospital Admission (non-emergent condition) []  - 0 New Admissions / Biomedical engineer / Ordering NPWT Apligraf, etc. , []  - 0 Emergency Hospital Admission (emergent condition) []  - 0 Simple Discharge Coordination X- 1 15 Complex (extensive) Discharge Coordination PROCESS - Special Needs []  - 0 Pediatric / Minor Patient Management []  - 0 Isolation Patient Management []  - 0 Hearing / Language / Visual special needs []  - 0 Assessment of Community assistance (transportation, D/C planning, etc.) []  - 0 Additional assistance / Altered mentation []  - 0 Support Surface(s)  Assessment (bed, cushion, seat, etc.) INTERVENTIONS - Wound Cleansing / Measurement []  - 0 Simple Wound Cleansing - one wound X- 1 5 Complex Wound Cleansing - multiple wounds X-  1 5 Wound Imaging (photographs - any number of wounds) []  - 0 Wound Tracing (instead of photographs) []  - 0 Simple Wound Measurement - one wound X- 1 5 Complex Wound Measurement - multiple wounds INTERVENTIONS - Wound Dressings []  - 0 Small Wound Dressing one or multiple wounds X- 1 15 Medium Wound Dressing one or multiple wounds []  - 0 Large Wound Dressing one or multiple wounds []  - 0 Application of Medications - topical []  - 0 Application of Medications - injection INTERVENTIONS - Miscellaneous []  - 0 External ear exam []  - 0 Specimen Collection (cultures, biopsies, blood, body fluids, etc.) []  - 0 Specimen(s) / Culture(s) sent or taken to Lab for analysis []  - 0 Patient Transfer (multiple staff / Civil Service fast streamer / Similar devices) []  - 0 Simple Staple / Suture removal (25 or less) []  - 0 Complex Staple / Suture removal (26 or more) []  - 0 Hypo / Hyperglycemic Management (close monitor of Blood Glucose) []  - 0 Ankle / Brachial Index (ABI) - do not check if billed separately X- 1 5 Vital Signs Has the patient been seen at the hospital within the last three years: Yes Total Score: 130 Level Of Care: New/Established - Level 4 Electronic Signature(s) Signed: 01/27/2020 4:55:33 PM By: [name] Entered By: [name] on 01/27/2020 10:03:10 -------------------------------------------------------------------------------- Encounter Discharge Information Details Patient Name: Date of Service: Ivan [name], Ivan BERT W. 01/27/2020 9:30 A M Medical Record Number: [id number] Patient Account Number: [id number] Date of Birth/Sex: Treating RN: 1946-04-11 (74 y.o. [name] Primary Care Kahiau Schewe: [name] Other Clinician: Referring Bader Stubblefield: Treating Lorriane Dehart/Extender: Fonnie Jarvis in Treatment: 26 Encounter Discharge Information Items Discharge Condition: Stable Ambulatory Status: Ambulatory Discharge Destination: Home Transportation: Private Auto Accompanied By: self Schedule Follow-up Appointment: Yes Clinical Summary of Care: Patient Declined Electronic Signature(s) Signed: 01/27/2020 4:46:23 PM By: Baruch Gouty RN, BSN Entered By: Baruch Gouty on 01/27/2020 10:16:09 -------------------------------------------------------------------------------- Lower Extremity Assessment Details Patient Name: Date of Service: Ivan [name], Ivan BERT W. 01/27/2020 9:30 A M Medical Record Number: [id number] Patient Account Number: [id number] Date of Birth/Sex: Treating RN: April 05, 1946 (74 y.o. [name] Primary Care Breea Loncar: [name] Other Clinician: Referring Manish Ruggiero: Treating Cornesha Radziewicz/Extender: Ronnie Derby, [name] in Treatment: 26 Electronic Signature(s) Signed: 01/27/2020 4:34:39 PM By: Kela Millin Entered By: Kela Millin on 01/27/2020 09:39:27 -------------------------------------------------------------------------------- Multi Wound Chart Details Patient Name: Date of Service: Ivan [name], Ivan BERT W. 01/27/2020 9:30 A M Medical Record Number: [id number] Patient Account Number: [id number] Date of Birth/Sex: Treating RN: 1946-02-05 (74 y.o. [name] Primary Care Clydean Posas: [name] Other Clinician: Referring Reace Breshears: Treating Shakela Donati/Extender: Ronnie Derby, [name] in Treatment: 26 Vital Signs Height(in): 70 Pulse(bpm): 57 Weight(lbs): 198 Blood Pressure(mmHg): 124/72 Body Mass Index(BMI): 28 Temperature(F): 97.9 Respiratory Rate(breaths/min): 18 Photos: [1:No Photos Left Trochanter] [N/A:N/A N/A] Wound Location: [1:Surgical Injury] [N/A:N/A] Wounding Event: [1:Pressure Ulcer] [N/A:N/A] Primary Etiology: [1:Open Surgical Wound] [N/A:N/A] Secondary Etiology:  [1:Cataracts, Coronary Artery Disease, N/A] Comorbid History: [1:Hypertension, Myocardial Infarction, Osteoarthritis, Osteomyelitis 07/01/2019] [N/A:N/A] Date Acquired: [1:26] [N/A:N/A] Weeks of Treatment: [1:Open] [N/A:N/A] Wound Status: [1:1x1.5x3] [N/A:N/A] Measurements L x W x D (cm) [1:1.178] [N/A:N/A] A (cm) : rea [1:3.534] [N/A:N/A] Volume (cm) : [1:94.50%] [N/A:N/A] % Reduction in A rea: [1:88.30%] [N/A:N/A] % Reduction in Volume: [1:7] Starting Position 1 (o'clock): [1:12] Ending Position 1 (o'clock): [1:4.9] Maximum Distance 1 (cm): [1:Yes] [N/A:N/A] Undermining: [1:Category/Stage IV] [N/A:N/A] Classification: [1:Medium] [N/A:N/A] Exudate A mount: [1:Serous] [N/A:N/A] Exudate Type: [1:amber] [N/A:N/A] Exudate Color: [  1:Well defined, not attached] [N/A:N/A] Wound Margin: [1:Large (67-100%)] [N/A:N/A] Granulation A mount: [1:Pink, Pale] [N/A:N/A] Granulation Quality: [1:Small (1-33%)] [N/A:N/A] Necrotic A mount: [1:Fat Layer (Subcutaneous Tissue): Yes N/A] Exposed Structures: [1:Muscle: Yes Fascia: No Tendon: No Joint: No Bone: No Medium (34-66%)] [N/A:N/A] Treatment Notes Wound #1 (Left Trochanter) 2. Periwound Care Skin Prep 3. Primary Dressing Applied Other primary dressing (specifiy in notes) 4. Secondary Dressing Foam Border Dressing Notes saline moistened gauze packing Electronic Signature(s) Signed: 01/27/2020 4:33:07 PM By: [name] MD Signed: 01/27/2020 4:55:33 PM By: [name] Entered By: [name] on 01/27/2020 10:50:00 -------------------------------------------------------------------------------- Multi-Disciplinary Care Plan Details Patient Name: Date of Service: Ivan [name], Larina Earthly. 01/27/2020 9:30 A M Medical Record Number: [id number] Patient Account Number: [id number] Date of Birth/Sex: Treating RN: 07-10-1945 (74 y.o. [name] Primary Care Maryiah Olvey: [name] Other Clinician: Referring Shaune Malacara: Treating  Kellie Murrill/Extender: Ronnie Derby, [name] in Treatment: 26 Active Inactive Wound/Skin Impairment Nursing Diagnoses: Knowledge deficit related to ulceration/compromised skin integrity Goals: Patient/caregiver will verbalize understanding of skin care regimen Date Initiated: 07/29/2019 Target Resolution Date: 02/11/2020 Goal Status: Active Interventions: Assess patient/caregiver ability to obtain necessary supplies Assess patient/caregiver ability to perform ulcer/skin care regimen upon admission and as needed Provide education on ulcer and skin care Treatment Activities: Skin care regimen initiated : 07/29/2019 Topical wound management initiated : 07/29/2019 Notes: Electronic Signature(s) Signed: 01/27/2020 4:55:33 PM By: [name] Entered By: [name] on 01/27/2020 09:37:23 -------------------------------------------------------------------------------- Pain Assessment Details Patient Name: Date of Service: Ivan [name], Ivan BERT W. 01/27/2020 9:30 A M Medical Record Number: [id number] Patient Account Number: [id number] Date of Birth/Sex: Treating RN: 1945-09-24 (74 y.o. [name] Primary Care Rishawn Walck: [name] Other Clinician: Referring Posie Lillibridge: Treating Argentina Kosch/Extender: Fonnie Jarvis in Treatment: 26 Active Problems Location of Pain Severity and Description of Pain Patient Has Paino No Site Locations Pain Management and Medication Current Pain Management: Electronic Signature(s) Signed: 01/27/2020 4:34:39 PM By: Kela Millin Entered By: Kela Millin on 01/27/2020 09:39:22 -------------------------------------------------------------------------------- Patient/Caregiver Education Details Patient Name: Date of Service: [place] M, Ivan BERT W. 9/16/2021andnbsp9:30 A M Medical Record Number: [id number] Patient Account Number: [id number] Date of Birth/Gender: Treating RN: 1946/03/27 (74 y.o. [name] Primary Care Physician: [name] Other Clinician: Referring Physician: Treating Physician/Extender: Fonnie Jarvis in Treatment: 26 Education Assessment Education Provided To: Patient Education Topics Provided Wound/Skin Impairment: Handouts: Skin Care Do's and Dont's Methods: Explain/Verbal Responses: Reinforcements needed Electronic Signature(s) Signed: 01/27/2020 4:55:33 PM By: [name] Entered By: [name] on 01/27/2020 09:37:34 -------------------------------------------------------------------------------- Wound Assessment Details Patient Name: Date of Service: Ivan [name], Ivan BERT W. 01/27/2020 9:30 A M Medical Record Number: [id number] Patient Account Number: [id number] Date of Birth/Sex: Treating RN: 01-Aug-1945 (74 y.o. [name] Primary Care Lashe Oliveira: [name] Other Clinician: Referring Caffie Sotto: Treating Amaira Safley/Extender: Ronnie Derby, [name] in Treatment: 26 Wound Status Wound Number: 1 Primary Pressure Ulcer Etiology: Wound Location: Left Trochanter Secondary Open Surgical Wound Wounding Event: Surgical Injury Etiology: Date Acquired: 07/01/2019 Wound Open Weeks Of Treatment: 26 Status: Clustered Wound: No Comorbid Cataracts, Coronary Artery Disease, Hypertension, Myocardial History: Infarction, Osteoarthritis, Osteomyelitis Wound Measurements Length: (cm) 1 Width: (cm) 1.5 Depth: (cm) 3 Area: (cm) 1.178 Volume: (cm) 3.534 % Reduction in Area: 94.5% % Reduction in Volume: 88.3% Epithelialization: Medium (34-66%) Tunneling: No Undermining: Yes Starting Position (o'clock): 7 Ending Position (o'clock): 12 Maximum Distance: (cm) 4.9 Wound Description Classification: Category/Stage IV Wound Margin: Well defined, not attached Exudate Amount:  Medium Exudate Type: Serous Exudate Color: amber Foul Odor After Cleansing: No Slough/Fibrino Yes Wound Bed Granulation Amount: Large  (67-100%) Exposed Structure Granulation Quality: Pink, Pale Fascia Exposed: No Necrotic Amount: Small (1-33%) Fat Layer (Subcutaneous Tissue) Exposed: Yes Necrotic Quality: Adherent Slough Tendon Exposed: No Muscle Exposed: Yes Necrosis of Muscle: No Joint Exposed: No Bone Exposed: No Treatment Notes Wound #1 (Left Trochanter) 2. Periwound Care Skin Prep 3. Primary Dressing Applied Other primary dressing (specifiy in notes) 4. Secondary Dressing Foam Border Dressing Notes saline moistened gauze packing Electronic Signature(s) Signed: 01/27/2020 4:34:39 PM By: Kela Millin Entered By: Kela Millin on 01/27/2020 09:42:49 -------------------------------------------------------------------------------- Vitals Details Patient Name: Date of Service: Ivan [name], Ivan BERT W. 01/27/2020 9:30 A M Medical Record Number: [id number] Patient Account Number: [id number] Date of Birth/Sex: Treating RN: Sep 01, 1945 (74 y.o. [name] Primary Care Cathi Hazan: [name] Other Clinician: Referring Kierah Goatley: Treating Aaralyn Kil/Extender: Ronnie Derby, [name] in Treatment: 26 Vital Signs Time Taken: 09:30 Temperature (F): 97.9 Height (in): 70 Pulse (bpm): 76 Weight (lbs): 198 Respiratory Rate (breaths/min): 18 Body Mass Index (BMI): 28.4 Blood Pressure (mmHg): 124/72 Reference Range: 80 - 120 mg / dl Electronic Signature(s) Signed: 01/27/2020 4:34:39 PM By: Kela Millin Entered By: Kela Millin on 01/27/2020 09:39:16

## 2020-01-27 NOTE — Progress Notes (Signed)
HARL, WIECHMANN ([id number]) Visit Report for 01/27/2020 HPI Details Patient Name: Date of Service: Ivan [name]. 01/27/2020 9:30 A M Medical Record Number: [id number] Patient Account Number: [id number] Date of Birth/Sex: Treating RN: 13-Apr-1946 (74 y.o. 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 He required admission to hospital from 1/5 through 1/9 with Covid pneumonia. At that point he apparently left AMA. He was noted to have a left hip bruise at the time of discharge. I suspect this really was a deep tissue injury. He was readmitted from 1/13 through 1//27 with altered mental status Covid pneumonia and delirium. At that point he had a left hip and thigh wound which had worsened. Felt to be a pressure injury. He was seen by general surgery who recommended normal saline wet-to-dry twice daily he did not have a debridement. After he left at that point he was incapacitated by increasing lumbar pain. An MRI on 2/9 showed discitis and osteomyelitis at L1/2. He asked aspiration of 1 cm fluid however I think this culture negative. He was also noted at this point to have a very large wound over the left greater trochanter. He was taken to the OR by Dr. Marla Roe for debridement with ACell placement. He was placed in a wound VAC. A culture at that point showed Enterococcus and Enterobacter. An MRI on 3/15 showed no evidence of left hip septic arthritis or osteomyelitis. Extensive subcutaneous edema within the visualized soft tissue of the left thigh and prominent left inguinal lymph nodes likely reactive. He was started on IV cefepime and vancomycin for 6 weeks. He was discharged to [facility] facility as the patient.  There I gather the wound developed the necrotic surface. He was seen by the wound care doctor in house who correctly pointed out that a wound VAC would not do anything with a necrotic surface. Since then he has been using Santyl The patient is now at home. He was on vancomycin and cefepime. He developed hives and a generalized rash and was seen in the ER on 3/15 by Dr. Cain Sieve recommendation he was changed to meropenem however he also apparently developed hives after this. He says the original hives stopped after the cefepime was stopped and follow-up hives only developed after the meropenem. He is only taken 1 dose of the fear of the meropenem and now is on vancomycin. 3/25; the patient was seen by Dr. Linus Salmons on 3/22. Because of intolerances to meropenem which I communicated with Dr. De Burrs about he is now on daptomycin and Cipro until sometime in early April. He seems to be tolerating the antibiotics well. We are using Santyl back by wet-to-dry dressings. And making some improvement in the general surface of this very large wound 4/8; we are using Santyl back by wet-to-dry. We are making progress in terms of prepping the wound for a wound VAC. Still necrotic material requiring debridement. 4/16; we are using Santyl back by wet-to-dry. Not convinced that his wife is packing this properly there is a 6 cm tunnel at 12:00. We brought this to attention. Still requiring mechanical debridement 4/29; 2-week follow-up. In general the wound is better however there is considerable tunneling tunneling to the greater trochanter itself. There is still some debris in the deeper parts of this wound I  did not do mechanical debridement on this today. I am going to change the dressing to quarter strength Dakin's wet-to-dry. We will put a wound VAC through his insurance. His wife has had brain damage from a previous meningioma surgery. She is not comfortable around strangers in her home. The patient is concerned about  this 5/18; patient is using quarter strength Dakin's wet to dry. The problem is a deep pressure area over the left greater trochanter. There was some question from her intake nurses that this is actually come in and some question raised about whether he would require a wound VAC or not. The issue with the wound VAC was complicated predominantly by the patient's reluctance to allow people into his home i.e. home health although he tells me he has this mostly "sorted out" 7/1; this is a patient that I have not seen in almost 6 weeks. Apparently canceled several appointments related to family problems deaths in the family etc. He is wife has ophthalmologic surgery next week. I had been trying to get a wound VAC ordered for this deep probing wound over the left greater trochanter but so far I think he is simply doing a Dakin's wet-to-dry and our intake nurse did not think there was any Dakin's on it. I have a note from Dr. Tommy Medal of infectious disease on 6/30. He has now been off doxycycline and ciprofloxacin for several weeks. He has no undue pain or drainage. He also had discitis and osteomyelitis at L1-L2 with a 1 cm left psoas abscess as well as some increased signal at T10/T11. He is not having any problems related to this. 12/16/2019 on evaluation today patient's wound actually is showing some signs of bit of improvement which is good news. Fortunately there is no signs of active infection at this time which is good news. He tells me that the nurses have been coming to change this 3 times a week and doing a great job. He told the intake nurse that they had only been coming out once a week. I am not really sure what is going on with the discrepancy and stories here. Nonetheless I do believe that he needs to have this changed 3 times a week on a regular basis he does seem to have a little bit of a fungal infection around the wound. 8/19; 2-week follow-up. I have not seen this patient in over 6 weeks.  There is been a dramatic improvement in the overall wound volume. Circumference of the wound is now small although there is still considerable undermining from roughly 9-2 o'clock maximum 1:00 at roughly 4.7 cm. There is no purulent drainage. He has some irritation around the wound on the normal skin. They are using miconazole on this. 9/2; 2-week follow-up. Dimensions of the wound circumference look quite good. There is tunneling at 4.3 cm at 1:00 versus 4.7 last time. The area continues to look like it is contracting with the wound VAC and I think the treatment of choice here will be continuing to the wound VAC as long as it seems practical. There is no evidence of infection. As far as I am aware he is offloading this aggressively 9/16; 2-week follow-up. Left greater trochanter. Dimensions about the same still about 4.9 cm of maximal undermining. Nevertheless the visible tissue that I can see looks healthy. We have a wound VAC and really from the perspective of how this looks initially this is gotten a lot better however not much change in the last week  Electronic Signature(s) Signed: 01/27/2020 4:33:07 PM By: [name] MD Entered By: [name] on 01/27/2020 10:51:53 -------------------------------------------------------------------------------- Physical Exam Details Patient Name: Date of Service: [name], [name] 01/27/2020 9:30 A M Medical Record Number: [id number] Patient Account Number: [id number] Date of Birth/Sex: Treating RN: [birth year] (74 y.o. [name] Primary Care Provider: [name] Other Clinician: Referring Provider: Treating Provider/Extender: Ronnie Derby, [name] in Treatment: 26 Constitutional Sitting or standing Blood Pressure is within target range for patient.. Pulse regular and within target range for patient.Marland Kitchen Respirations regular, non-labored and within target range.. Temperature is normal and within the target range for the  patient.Marland Kitchen Appears in no distress. Notes Wound exam; I think the patient's overall wound volume is about the same this week. There is substantial tunneling maximum 1:00 4.9 cm. The visible granulation however looks healthy. There is no evidence of surrounding infection. No soft tissue crepitus. Nothing required culturing Electronic Signature(s) Signed: 01/27/2020 4:33:07 PM By: [name] MD Entered By: [name] on 01/27/2020 10:53:20 -------------------------------------------------------------------------------- Physician Orders Details Patient Name: Date of Service: [name], [name] 01/27/2020 9:30 A M Medical Record Number: [id number] Patient Account Number: [id number] Date of Birth/Sex: Treating RN: 1945-09-13 (74 y.o. [name] Primary Care Provider: [name] Other Clinician: Referring Provider: Treating Provider/Extender: Fonnie Jarvis in Treatment: 26 Verbal / Phone Orders: No Diagnosis Coding ICD-10 Coding Code Description [phone] Pressure ulcer of left hip, stage 4 T81.31XD Disruption of external operation (surgical) wound, not elsewhere classified, subsequent encounter Follow-up Appointments Return Appointment in 2 weeks. Dressing Change Frequency Wound #1 Left Trochanter Change dressing three times week. - Monday, Wednesday and Friday by home health Skin Barriers/Peri-Wound Care Wound #1 Left Trochanter Other: - Crusting technique to periwound: (Nystatin powder, skin prep, powder, skin prep) Wound Cleansing Wound #1 Left Trochanter Clean wound with Wound Cleanser - or wound cleanser Primary Wound Dressing Wound #1 Left Trochanter Other: - Wet-to-dry today in clinic. Home health to resume wound vac at home. Secondary Dressing Wound #1 Left Trochanter Foam Border Negative Presssure Wound Therapy Wound #1 Left Trochanter Medela Wound Vac continuously at 164mm/hg Black and White Foam combination - white foam to  undermining Off-Loading Other: - stay off of left hip to aid in offloading pressure. [facility] skilled nursing for wound care. - [facility] Electronic Signature(s) Signed: 01/27/2020 4:33:07 PM By: [name] MD Signed: 01/27/2020 4:55:33 PM By: [name] Entered By: [name] on 01/27/2020 10:02:25 -------------------------------------------------------------------------------- Problem List Details Patient Name: Date of Service: [name], [name] 01/27/2020 9:30 A M Medical Record Number: [id number] Patient Account Number: [id number] Date of Birth/Sex: Treating RN: [birth year] (74 y.o. [name] Primary Care Provider: [name] Other Clinician: Referring Provider: Treating Provider/Extender: Fonnie Jarvis in Treatment: 26 Active Problems ICD-10 Encounter Code Description Active Date MDM Diagnosis L89.224 Pressure ulcer of left hip, stage 4 08/05/2019 No Yes T81.31XD Disruption of external operation (surgical) wound, not elsewhere classified, 07/29/2019 No Yes subsequent encounter Inactive Problems Resolved Problems Electronic Signature(s) Signed: 01/27/2020 4:33:07 PM By: [name] MD Entered By: [name] on 01/27/2020 10:49:47 -------------------------------------------------------------------------------- Progress Note Details Patient Name: Date of Service: [name], [name] 01/27/2020 9:30 A M Medical Record Number: [id number] Patient Account Number: [id number] Date of Birth/Sex: Treating RN: 01-14-1946 (74 y.o. 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 He required admission to hospital from 1/5 through 1/9 with  Covid pneumonia. At that point he apparently left AMA. He was noted to have a left hip bruise at the time of discharge. I suspect this really was a deep tissue injury. He was readmitted from 1/13 through 1//27 with altered mental status Covid pneumonia and delirium. At that point he had a left hip and thigh wound which had worsened. Felt to be a pressure injury. He was seen by general surgery who recommended normal saline wet-to-dry twice daily he did not have a debridement. After he left at that point he was incapacitated by increasing lumbar pain. An MRI on 2/9 showed discitis and osteomyelitis at L1/2. He asked aspiration of 1 cm fluid however I think this culture negative. He was also noted at this point to have a very large wound over the left greater trochanter. He was taken to the OR by Dr. Marla Roe for debridement with ACell placement. He was placed in a wound VAC. A culture at that point showed Enterococcus and Enterobacter. An MRI on 3/15 showed no evidence of left hip septic arthritis or osteomyelitis. Extensive subcutaneous edema within the visualized soft tissue of the left thigh and prominent left inguinal lymph nodes likely reactive. He was started on IV cefepime and vancomycin for 6 weeks. He was discharged to [facility] as the patient. There I gather the wound developed the necrotic surface. He was seen by the wound care doctor in house who correctly pointed out that a wound VAC would not do anything with a necrotic surface. Since then he has been using Santyl The patient is now at home. He was on vancomycin and cefepime. He developed hives and a generalized rash and was seen in the ER on 3/15 by Dr. Cain Sieve recommendation he was changed to meropenem however he also apparently developed hives after this. He says the original hives stopped after the cefepime was stopped and follow-up hives only developed after the meropenem. He is only taken 1 dose of the fear of the  meropenem and now is on vancomycin. 3/25; the patient was seen by Dr. Linus Salmons on 3/22. Because of intolerances to meropenem which I communicated with Dr. De Burrs about he is now on daptomycin and Cipro until sometime in early April. He seems to be tolerating the antibiotics well. We are using Santyl back by wet-to-dry dressings. And making some improvement in the general surface of this very large wound 4/8; we are using Santyl back by wet-to-dry. We are making progress in terms of prepping the wound for a wound VAC. Still necrotic material requiring debridement. 4/16; we are using Santyl back by wet-to-dry. Not convinced that his wife is packing this properly there is a 6 cm tunnel at 12:00. We brought this to attention. Still requiring mechanical debridement 4/29; 2-week follow-up. In general the wound is better however there is considerable tunneling tunneling to the greater trochanter itself. There is still some debris in the deeper parts of this wound I did not do mechanical debridement on this today. I am going to change the dressing to quarter strength Dakin's wet-to-dry. We will put a wound VAC through his insurance. His wife has had brain damage from a previous meningioma surgery. She is not comfortable around strangers in her home. The patient is concerned about this 5/18; patient is using quarter strength Dakin's  wet to dry. The problem is a deep pressure area over the left greater trochanter. There was some question from her intake nurses that this is actually come in and some question raised about whether he would require a wound VAC or not. The issue with the wound VAC was complicated predominantly by the patient's reluctance to allow people into his home i.e. home health although he tells me he has this mostly "sorted out" 7/1; this is a patient that I have not seen in almost 6 weeks. Apparently canceled several appointments related to family problems deaths in the family etc. He is wife  has ophthalmologic surgery next week. I had been trying to get a wound VAC ordered for this deep probing wound over the left greater trochanter but so far I think he is simply doing a Dakin's wet-to-dry and our intake nurse did not think there was any Dakin's on it. I have a note from Dr. Tommy Medal of infectious disease on 6/30. He has now been off doxycycline and ciprofloxacin for several weeks. He has no undue pain or drainage. He also had discitis and osteomyelitis at L1-L2 with a 1 cm left psoas abscess as well as some increased signal at T10/T11. He is not having any problems related to this. 12/16/2019 on evaluation today patient's wound actually is showing some signs of bit of improvement which is good news. Fortunately there is no signs of active infection at this time which is good news. He tells me that the nurses have been coming to change this 3 times a week and doing a great job. He told the intake nurse that they had only been coming out once a week. I am not really sure what is going on with the discrepancy and stories here. Nonetheless I do believe that he needs to have this changed 3 times a week on a regular basis he does seem to have a little bit of a fungal infection around the wound. 8/19; 2-week follow-up. I have not seen this patient in over 6 weeks. There is been a dramatic improvement in the overall wound volume. Circumference of the wound is now small although there is still considerable undermining from roughly 9-2 o'clock maximum 1:00 at roughly 4.7 cm. There is no purulent drainage. He has some irritation around the wound on the normal skin. They are using miconazole on this. 9/2; 2-week follow-up. Dimensions of the wound circumference look quite good. There is tunneling at 4.3 cm at 1:00 versus 4.7 last time. The area continues to look like it is contracting with the wound VAC and I think the treatment of choice here will be continuing to the wound VAC as long as it seems  practical. There is no evidence of infection. As far as I am aware he is offloading this aggressively 9/16; 2-week follow-up. Left greater trochanter. Dimensions about the same still about 4.9 cm of maximal undermining. Nevertheless the visible tissue that I can see looks healthy. We have a wound VAC and really from the perspective of how this looks initially this is gotten a lot better however not much change in the last week Objective Constitutional Sitting or standing Blood Pressure is within target range for patient.. Pulse regular and within target range for patient.Marland Kitchen Respirations regular, non-labored and within target range.. Temperature is normal and within the target range for the patient.Marland Kitchen Appears in no distress. Vitals Time Taken: 9:30 AM, Height: 70 in, Weight: 198 lbs, BMI: 28.4, Temperature: 97.9 F, Pulse: 76 bpm,  Respiratory Rate: 18 breaths/min, Blood Pressure: 124/72 mmHg. General Notes: Wound exam; I think the patient's overall wound volume is about the same this week. There is substantial tunneling maximum 1:00 4.9 cm. The visible granulation however looks healthy. There is no evidence of surrounding infection. No soft tissue crepitus. Nothing required culturing Integumentary (Hair, Skin) Wound #1 status is Open. Original cause of wound was Surgical Injury. The wound is located on the Left Trochanter. The wound measures 1cm length x 1.5cm width x 3cm depth; 1.178cm^2 area and 3.534cm^3 volume. There is muscle and Fat Layer (Subcutaneous Tissue) exposed. There is no tunneling noted, however, there is undermining starting at 7:00 and ending at 12:00 with a maximum distance of 4.9cm. There is a medium amount of serous drainage noted. The wound margin is well defined and not attached to the wound base. There is large (67-100%) pink, pale granulation within the wound bed. There is a small (1- 33%) amount of necrotic tissue within the wound bed including Adherent  Slough. Assessment Active Problems ICD-10 Pressure ulcer of left hip, stage 4 Disruption of external operation (surgical) wound, not elsewhere classified, subsequent encounter Plan Follow-up Appointments: Return Appointment in 2 weeks. Dressing Change Frequency: Wound #1 Left Trochanter: Change dressing three times week. - Monday, Wednesday and Friday by home health Skin Barriers/Peri-Wound Care: Wound #1 Left Trochanter: Other: - Crusting technique to periwound: (Nystatin powder, skin prep, powder, skin prep) Wound Cleansing: Wound #1 Left Trochanter: Clean wound with Wound Cleanser - or wound cleanser Primary Wound Dressing: Wound #1 Left Trochanter: Other: - Wet-to-dry today in clinic. Home health to resume wound vac at home. Secondary Dressing: Wound #1 Left Trochanter: Foam Border Negative Presssure Wound Therapy: Wound #1 Left Trochanter: Medela Wound Vac continuously at 119mm/hg Black and White Foam combination - white foam to undermining Off-Loading: Other: - stay off of left hip to aid in offloading pressure. Home Health: [facility] for wound care. - [facility] 1. We are continuing with the wound VAC as ordered 2. I would like to see some improvement in the tunneling of the 1:00 next time. Electronic Signature(s) Signed: 01/27/2020 4:33:07 PM By: [name] MD Entered By: [name] on 01/27/2020 10:54:37 -------------------------------------------------------------------------------- SuperBill Details Patient Name: Date of Service: [name], [name] 01/27/2020 Medical Record Number: [id number] Patient Account Number: [id number] Date of Birth/Sex: Treating RN: November 17, 1945 (74 y.o. [name] Primary Care Provider: [name] Other Clinician: Referring Provider: Treating Provider/Extender: Ronnie Derby, [name] in Treatment: 26 Diagnosis Coding ICD-10 Codes Code Description [phone] Pressure ulcer of  left hip, stage 4 T81.31XD Disruption of external operation (surgical) wound, not elsewhere classified, subsequent encounter Facility Procedures The patient participates with Medicare or their insurance follows the Medicare Facility Guidelines: CPT4 Code Description Modifier Quantity [id number] [place] VISIT-LEV 4 EST PT 1 Physician Procedures : CPT4 Code Description Modifier [id number] - WC PHYS LEVEL 3 - EST PT ICD-10 Diagnosis Description L89.224 Pressure ulcer of left hip, stage 4 T81.31XD Disruption of external operation (surgical) wound, not elsewhere classified, subsequent encounter Quantity: 1 Electronic Signature(s) Signed: 01/27/2020 4:33:07 PM By: [name] MD Entered By: [name] on 01/27/2020 10:54:58

## 2020-01-31 ENCOUNTER — Other Ambulatory Visit: Payer: Self-pay

## 2020-01-31 NOTE — Telephone Encounter (Signed)
Opened in error.   [name] C Dawana Asper, RN  

## 2020-02-01 ENCOUNTER — Telehealth: Payer: Self-pay

## 2020-02-01 NOTE — Telephone Encounter (Signed)
COVID-19 Pre-Screening Questions:02/01/20 ° °Do you currently have a fever (>100 °F), chills or unexplained body aches?NO ° °Are you currently experiencing new cough, shortness of breath, sore throat, runny nose? NO °•  °Have you been in contact with someone that is currently pending confirmation of Covid19 testing or has been confirmed to have the Covid19 virus? NO  ° °**If the patient answers NO to ALL questions -  advise the patient to please call the clinic before coming to the office should any symptoms develop.  ° ° °

## 2020-02-02 ENCOUNTER — Encounter: Payer: Self-pay | Admitting: Infectious Disease

## 2020-02-02 ENCOUNTER — Ambulatory Visit (INDEPENDENT_AMBULATORY_CARE_PROVIDER_SITE_OTHER): Payer: Medicare HMO | Admitting: Infectious Disease

## 2020-02-02 ENCOUNTER — Other Ambulatory Visit: Payer: Self-pay

## 2020-02-02 VITALS — BP 122/71 | HR 72 | Temp 98.3°F | Ht 70.0 in | Wt 224.0 lb

## 2020-02-02 DIAGNOSIS — L89104 Pressure ulcer of unspecified part of back, stage 4: Secondary | ICD-10-CM

## 2020-02-02 DIAGNOSIS — Z23 Encounter for immunization: Secondary | ICD-10-CM

## 2020-02-02 DIAGNOSIS — U071 COVID-19: Secondary | ICD-10-CM

## 2020-02-02 DIAGNOSIS — M4646 Discitis, unspecified, lumbar region: Secondary | ICD-10-CM | POA: Diagnosis not present
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
## 2020-02-08 NOTE — Progress Notes (Signed)
    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 He was seen by ID, Dr, Megan Salon and told that he did not need treatment Has not had hemoptysis or weight loss Only cough was from [address] in January Wants to go back to work and needs testing Has had previously reactive PPDs and was told that he should not get these anymore Last chest x-ray in March 2021 was negative for signs of active TB  Hypotension Reports compliance with his blood pressure medications Reports that his blood pressure frequently fluctuates and that he frequently has systolics in the 71I, but is asymptomatic with this Feels well, no dizziness or syncope Reports that he is asymptomatic at present and feels well.  PERTINENT  PMH / PSH: CAD s/p CABG, stroke, hypertension, infrarenal abdominal aortic aneurysm, male hypogonadism, degenerative disc disease of lumbar region, decubitus ulcer, BPH, PTSD, depression  OBJECTIVE:   BP (!) 98/46   Pulse 85   Wt 225 lb 9.6 oz (102.3 kg)   SpO2 96%   BMI 32.37 kg/m    Physical Exam:  General: 74 y.o. male in NAD Lungs: CTAB, no wheezing, no rhonchi, no crackles, no IWOB on RA Skin: warm and dry Extremities: No edema   ASSESSMENT/PLAN:   Screening for tuberculosis Previously reactive PPD and was advised to be tested in other way.  Per CDC guidelines, QuantiFERON gold will be sufficient for TB screening for this patient.  His last chest x-ray was in March and did not show signs of active TB.  Will obtain QuantiFERON gold, if negative no follow-up, if positive, will need another chest x-ray.  Hypotension BP 98/46 today.  Patient reports no symptoms of this and states that his blood pressure fluctuates all the time.  Currently on lisinopril and Coreg, reports compliance.  Advise follow-up with PCP in 2 to 4 weeks.     Cleophas Dunker, [place]

## 2020-02-09 ENCOUNTER — Ambulatory Visit: Payer: Medicare HMO | Admitting: Infectious Disease

## 2020-02-09 ENCOUNTER — Ambulatory Visit (INDEPENDENT_AMBULATORY_CARE_PROVIDER_SITE_OTHER): Payer: Medicare HMO | Admitting: Family Medicine

## 2020-02-09 ENCOUNTER — Other Ambulatory Visit: Payer: Self-pay

## 2020-02-09 VITALS — BP 98/46 | HR 85 | Wt 225.6 lb

## 2020-02-09 DIAGNOSIS — I959 Hypotension, unspecified: Secondary | ICD-10-CM | POA: Diagnosis not present

## 2020-02-09 DIAGNOSIS — Z111 Encounter for screening for respiratory tuberculosis: Secondary | ICD-10-CM | POA: Insufficient documentation

## 2020-02-09 DIAGNOSIS — R7612 Nonspecific reaction to cell mediated immunity measurement of gamma interferon antigen response without active tuberculosis: Secondary | ICD-10-CM

## 2020-02-09 HISTORY — DX: Nonspecific reaction to cell mediated immunity measurement of gamma interferon antigen response without active tuberculosis: R76.12

## 2020-02-09 NOTE — Patient Instructions (Signed)
Thank you for coming to see me today. It was a pleasure. Today we talked about:   We will check a QuantiFERON gold to screen for tuberculosis.  As long as this is negative, there is nothing else that we need to do.  We will send you of the results on my chart.  Please follow-up with PCP in 2 to 4 weeks to follow-up on your blood pressure.  If you have any questions or concerns, please do not hesitate to call the office at [phone].  Best,   [place]zona Constable, DO

## 2020-02-09 NOTE — Assessment & Plan Note (Signed)
Previously reactive PPD and was advised to be tested in other way.  Per CDC guidelines, QuantiFERON gold will be sufficient for TB screening for this patient.  His last chest x-ray was in March and did not show signs of active TB.  Will obtain QuantiFERON gold, if negative no follow-up, if positive, will need another chest x-ray.

## 2020-02-09 NOTE — Assessment & Plan Note (Signed)
BP 98/46 today.  Patient reports no symptoms of this and states that his blood pressure fluctuates all the time.  Currently on lisinopril and Coreg, reports compliance.  Advise follow-up with PCP in 2 to 4 weeks.

## 2020-02-10 ENCOUNTER — Encounter (HOSPITAL_BASED_OUTPATIENT_CLINIC_OR_DEPARTMENT_OTHER): Payer: Medicare HMO | Admitting: Internal Medicine

## 2020-02-10 DIAGNOSIS — L89224 Pressure ulcer of left hip, stage 4: Secondary | ICD-10-CM | POA: Diagnosis not present

## 2020-02-10 NOTE — Progress Notes (Signed)
KULLEN, TOMASETTI ([id number]) Visit Report for 02/10/2020 HPI Details Patient Name: Date of Service: Ivan [name]. 02/10/2020 9:30 A M Medical Record Number: [id number] Patient Account Number: [id number] Date of Birth/Sex: Treating RN: 1945-12-27 (74 y.o. 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 He required admission to hospital from 1/5 through 1/9 with Covid pneumonia. At that point he apparently left AMA. He was noted to have a left hip bruise at the time of discharge. I suspect this really was a deep tissue injury. He was readmitted from 1/13 through 1//27 with altered mental status Covid pneumonia and delirium. At that point he had a left hip and thigh wound which had worsened. Felt to be a pressure injury. He was seen by general surgery who recommended normal saline wet-to-dry twice daily he did not have a debridement. After he left at that point he was incapacitated by increasing lumbar pain. An MRI on 2/9 showed discitis and osteomyelitis at L1/2. He asked aspiration of 1 cm fluid however I think this culture negative. He was also noted at this point to have a very large wound over the left greater trochanter. He was taken to the OR by Dr. Marla Roe for debridement with ACell placement. He was placed in a wound VAC. A culture at that point showed Enterococcus and Enterobacter. An MRI on 3/15 showed no evidence of left hip septic arthritis or osteomyelitis. Extensive subcutaneous edema within the visualized soft tissue of the left thigh and prominent left inguinal lymph nodes likely reactive. He was started on IV cefepime and vancomycin for 6 weeks. He was discharged to [facility] facility as the patient.  There I gather the wound developed the necrotic surface. He was seen by the wound care doctor in house who correctly pointed out that a wound VAC would not do anything with a necrotic surface. Since then he has been using Santyl The patient is now at home. He was on vancomycin and cefepime. He developed hives and a generalized rash and was seen in the ER on 3/15 by Dr. Cain Sieve recommendation he was changed to meropenem however he also apparently developed hives after this. He says the original hives stopped after the cefepime was stopped and follow-up hives only developed after the meropenem. He is only taken 1 dose of the fear of the meropenem and now is on vancomycin. 3/25; the patient was seen by Dr. Linus Salmons on 3/22. Because of intolerances to meropenem which I communicated with Dr. De Burrs about he is now on daptomycin and Cipro until sometime in early April. He seems to be tolerating the antibiotics well. We are using Santyl back by wet-to-dry dressings. And making some improvement in the general surface of this very large wound 4/8; we are using Santyl back by wet-to-dry. We are making progress in terms of prepping the wound for a wound VAC. Still necrotic material requiring debridement. 4/16; we are using Santyl back by wet-to-dry. Not convinced that his wife is packing this properly there is a 6 cm tunnel at 12:00. We brought this to attention. Still requiring mechanical debridement 4/29; 2-week follow-up. In general the wound is better however there is considerable tunneling tunneling to the greater trochanter itself. There is still some debris in the deeper parts of this wound I  did not do mechanical debridement on this today. I am going to change the dressing to quarter strength Dakin's wet-to-dry. We will put a wound VAC through his insurance. His wife has had brain damage from a previous meningioma surgery. She is not comfortable around strangers in her home. The patient is concerned about  this 5/18; patient is using quarter strength Dakin's wet to dry. The problem is a deep pressure area over the left greater trochanter. There was some question from her intake nurses that this is actually come in and some question raised about whether he would require a wound VAC or not. The issue with the wound VAC was complicated predominantly by the patient's reluctance to allow people into his home i.e. home health although he tells me he has this mostly "sorted out" 7/1; this is a patient that I have not seen in almost 6 weeks. Apparently canceled several appointments related to family problems deaths in the family etc. He is wife has ophthalmologic surgery next week. I had been trying to get a wound VAC ordered for this deep probing wound over the left greater trochanter but so far I think he is simply doing a Dakin's wet-to-dry and our intake nurse did not think there was any Dakin's on it. I have a note from Dr. Tommy Medal of infectious disease on 6/30. He has now been off doxycycline and ciprofloxacin for several weeks. He has no undue pain or drainage. He also had discitis and osteomyelitis at L1-L2 with a 1 cm left psoas abscess as well as some increased signal at T10/T11. He is not having any problems related to this. 12/16/2019 on evaluation today patient's wound actually is showing some signs of bit of improvement which is good news. Fortunately there is no signs of active infection at this time which is good news. He tells me that the nurses have been coming to change this 3 times a week and doing a great job. He told the intake nurse that they had only been coming out once a week. I am not really sure what is going on with the discrepancy and stories here. Nonetheless I do believe that he needs to have this changed 3 times a week on a regular basis he does seem to have a little bit of a fungal infection around the wound. 8/19; 2-week follow-up. I have not seen this patient in over 6 weeks.  There is been a dramatic improvement in the overall wound volume. Circumference of the wound is now small although there is still considerable undermining from roughly 9-2 o'clock maximum 1:00 at roughly 4.7 cm. There is no purulent drainage. He has some irritation around the wound on the normal skin. They are using miconazole on this. 9/2; 2-week follow-up. Dimensions of the wound circumference look quite good. There is tunneling at 4.3 cm at 1:00 versus 4.7 last time. The area continues to look like it is contracting with the wound VAC and I think the treatment of choice here will be continuing to the wound VAC as long as it seems practical. There is no evidence of infection. As far as I am aware he is offloading this aggressively 9/16; 2-week follow-up. Left greater trochanter. Dimensions about the same still about 4.9 cm of maximal undermining. Nevertheless the visible tissue that I can see looks healthy. We have a wound VAC and really from the perspective of how this looks initially this is gotten a lot better however not much change in the last week  9/32-week follow-up. Left greater trochanter. He has a tunnel at 12:00 that goes about 7 cm. From 10-12 lesser degrees of undermining. HOWEVER he comes in today telling us that he is going to have to go back to work. He will not be able to work with a wound VAC and he wants that discontinued. There was not much discussion. This is a financial issue for him. He also has erythema around the wound orifice which has satellite lesions around it this is no doubt cutaneous candidiasis Electronic Signature(s) Signed: 02/10/2020 4:51:06 PM By: [name] MD Entered By: [name] on 02/10/2020 10:15:42 -------------------------------------------------------------------------------- Physical Exam Details Patient Name: Date of Service: Ivan [name], Ivan BERT W. 02/10/2020 9:30 A M Medical Record Number: [id number] Patient Account Number: [id number] Date  of Birth/Sex: Treating RN: 1946-04-10 (74 y.o. [name] Primary Care Provider: [name] Other Clinician: Referring Provider: Treating Provider/Extender: Fonnie Jarvis in Treatment: 29 Constitutional Patient is hypertensive.. Pulse regular and within target range for patient.Marland Kitchen Respirations regular, non-labored and within target range.. Temperature is normal and within the target range for the patient.Marland Kitchen Appears in no distress. Notes Wound exam; I think the overall wound volume is probably about the same. He has a substantial tunneling area at 12:00 but were measuring this even larger than last time over 7 cm. There is no purulent drainage. He has erythema around the wound but this is much more compatible with Candida rather than any cellulitis. This was aggravated by the wound VAC drape. There is no surrounding tenderness no crepitus. The tunnel does not probe to bone. Electronic Signature(s) Signed: 02/10/2020 4:51:06 PM By: [name] MD Entered By: [name] on 02/10/2020 10:17:13 -------------------------------------------------------------------------------- Physician Orders Details Patient Name: Date of Service: Ivan [name], Ivan BERT W. 02/10/2020 9:30 A M Medical Record Number: [id number] Patient Account Number: [id number] Date of Birth/Sex: Treating RN: July 04, 1945 (74 y.o. [name] Primary Care Provider: [name] Other Clinician: Referring Provider: Treating Provider/Extender: Fonnie Jarvis in Treatment: 28 Verbal / Phone Orders: No Diagnosis Coding ICD-10 Coding Code Description 7068078978 Pressure ulcer of left hip, stage 4 T81.31XD Disruption of external operation (surgical) wound, not elsewhere classified, subsequent encounter Follow-up Appointments Return Appointment in 2 weeks. Dressing Change Frequency Wound #1 Left Trochanter Change dressing every day. Skin Barriers/Peri-Wound Care Wound #1  Left Trochanter ntifungal cream - lotrimin cream antifungal apply daily around the wound. A in clinic to apply ketoconazole. Wound Cleansing Wound #1 Left Trochanter May shower and wash wound with soap and water. Primary Wound Dressing Wound #1 Left Trochanter Other: - apply anascept gel or silvasorb gel wet to dry dressing daily. Secondary Dressing Wound #1 Left Trochanter ABD pad Negative Presssure Wound Therapy Wound #1 Left Trochanter Other: - discontinue the wound vac. patient to call and return wound vac to company. Off-Loading Other: - stay off of left hip to aid in offloading pressure. [facility] - discontinue home health [facility]. Electronic Signature(s) Signed: 02/10/2020 4:51:06 PM By: [name] MD Signed: 02/10/2020 5:01:36 PM By: [name] Entered By: [name] on 02/10/2020 10:22:00 -------------------------------------------------------------------------------- Problem List Details Patient Name: Date of Service: Ivan [name], Ivan BERT W. 02/10/2020 9:30 A M Medical Record Number: [id number] Patient Account Number: [id number] Date of Birth/Sex: Treating RN: [birth year] (73 y.o. [name] Primary Care Provider: [name] Other Clinician: Referring Provider: Treating Provider/Extender: Fonnie Jarvis in Treatment: 28 Active Problems ICD-10 Encounter Code Description Active Date MDM  Diagnosis L89.224 Pressure ulcer of left hip, stage 4 08/05/2019 No Yes T81.31XD Disruption of external operation (surgical) wound, not elsewhere classified, 07/29/2019 No Yes subsequent encounter B37.2 Candidiasis of skin and nail 02/10/2020 No Yes Inactive Problems Resolved Problems Electronic Signature(s) Signed: 02/10/2020 4:51:06 PM By: [name] MD Entered By: [name] on 02/10/2020 10:14:13 -------------------------------------------------------------------------------- Progress Note  Details Patient Name: Date of Service: Ivan [name], Ivan BERT W. 02/10/2020 9:30 A M Medical Record Number: [id number] Patient Account Number: [id number] Date of Birth/Sex: Treating RN: 02/29/1946 (74 y.o. 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 He required admission to hospital from 1/5 through 1/9 with Covid pneumonia. At that point he apparently left AMA. He was noted to have a left hip bruise at the time of discharge. I suspect this really was a deep tissue injury. He was readmitted from 1/13 through 1//27 with altered mental status Covid pneumonia and delirium. At that point he had a left hip and thigh wound which had worsened. Felt to be a pressure injury. He was seen by general surgery who recommended normal saline wet-to-dry twice daily he did not have a debridement. After he left at that point he was incapacitated by increasing lumbar pain. An MRI on 2/9 showed discitis and osteomyelitis at L1/2. He asked aspiration of 1 cm fluid however I think this culture negative. He was also noted at this point to have a very large wound over the left greater trochanter. He was taken to the OR by Dr. Marla Roe for debridement with ACell placement. He was placed in a wound VAC. A culture at that point showed Enterococcus and Enterobacter. An MRI on 3/15 showed no evidence of left hip septic arthritis or osteomyelitis. Extensive subcutaneous edema within the visualized soft tissue of the left thigh and prominent left inguinal lymph nodes likely reactive. He was started on IV cefepime and vancomycin for 6 weeks. He was discharged to [facility] facility as the patient. There I gather the wound developed the necrotic surface. He  was seen by the wound care doctor in house who correctly pointed out that a wound VAC would not do anything with a necrotic surface. Since then he has been using Santyl The patient is now at home. He was on vancomycin and cefepime. He developed hives and a generalized rash and was seen in the ER on 3/15 by Dr. Cain Sieve recommendation he was changed to meropenem however he also apparently developed hives after this. He says the original hives stopped after the cefepime was stopped and follow-up hives only developed after the meropenem. He is only taken 1 dose of the fear of the meropenem and now is on vancomycin. 3/25; the patient was seen by Dr. Linus Salmons on 3/22. Because of intolerances to meropenem which I communicated with Dr. De Burrs about he is now on daptomycin and Cipro until sometime in early April. He seems to be tolerating the antibiotics well. We are using Santyl back by wet-to-dry dressings. And making some improvement in the general surface of this very large wound 4/8; we are using Santyl back by wet-to-dry. We are making progress in terms of prepping the wound for a wound VAC. Still necrotic material requiring debridement. 4/16; we are using Santyl back by wet-to-dry. Not convinced that his wife is packing  this properly there is a 6 cm tunnel at 12:00. We brought this to attention. Still requiring mechanical debridement 4/29; 2-week follow-up. In general the wound is better however there is considerable tunneling tunneling to the greater trochanter itself. There is still some debris in the deeper parts of this wound I did not do mechanical debridement on this today. I am going to change the dressing to quarter strength Dakin's wet-to-dry. We will put a wound VAC through his insurance. His wife has had brain damage from a previous meningioma surgery. She is not comfortable around strangers in her home. The patient is concerned about this 5/18; patient is using quarter strength Dakin's wet to  dry. The problem is a deep pressure area over the left greater trochanter. There was some question from her intake nurses that this is actually come in and some question raised about whether he would require a wound VAC or not. The issue with the wound VAC was complicated predominantly by the patient's reluctance to allow people into his home i.e. home health although he tells me he has this mostly "sorted out" 7/1; this is a patient that I have not seen in almost 6 weeks. Apparently canceled several appointments related to family problems deaths in the family etc. He is wife has ophthalmologic surgery next week. I had been trying to get a wound VAC ordered for this deep probing wound over the left greater trochanter but so far I think he is simply doing a Dakin's wet-to-dry and our intake nurse did not think there was any Dakin's on it. I have a note from Dr. Tommy Medal of infectious disease on 6/30. He has now been off doxycycline and ciprofloxacin for several weeks. He has no undue pain or drainage. He also had discitis and osteomyelitis at L1-L2 with a 1 cm left psoas abscess as well as some increased signal at T10/T11. He is not having any problems related to this. 12/16/2019 on evaluation today patient's wound actually is showing some signs of bit of improvement which is good news. Fortunately there is no signs of active infection at this time which is good news. He tells me that the nurses have been coming to change this 3 times a week and doing a great job. He told the intake nurse that they had only been coming out once a week. I am not really sure what is going on with the discrepancy and stories here. Nonetheless I do believe that he needs to have this changed 3 times a week on a regular basis he does seem to have a little bit of a fungal infection around the wound. 8/19; 2-week follow-up. I have not seen this patient in over 6 weeks. There is been a dramatic improvement in the overall wound  volume. Circumference of the wound is now small although there is still considerable undermining from roughly 9-2 o'clock maximum 1:00 at roughly 4.7 cm. There is no purulent drainage. He has some irritation around the wound on the normal skin. They are using miconazole on this. 9/2; 2-week follow-up. Dimensions of the wound circumference look quite good. There is tunneling at 4.3 cm at 1:00 versus 4.7 last time. The area continues to look like it is contracting with the wound VAC and I think the treatment of choice here will be continuing to the wound VAC as long as it seems practical. There is no evidence of infection. As far as I am aware he is offloading this aggressively 9/16; 2-week follow-up. Left  greater trochanter. Dimensions about the same still about 4.9 cm of maximal undermining. Nevertheless the visible tissue that I can see looks healthy. We have a wound VAC and really from the perspective of how this looks initially this is gotten a lot better however not much change in the last week 9/32-week follow-up. Left greater trochanter. He has a tunnel at 12:00 that goes about 7 cm. From 10-12 lesser degrees of undermining. HOWEVER he comes in today telling us that he is going to have to go back to work. He will not be able to work with a wound VAC and he wants that discontinued. There was not much discussion. This is a financial issue for him. He also has erythema around the wound orifice which has satellite lesions around it this is no doubt cutaneous candidiasis Objective Constitutional Patient is hypertensive.. Pulse regular and within target range for patient.Marland Kitchen Respirations regular, non-labored and within target range.. Temperature is normal and within the target range for the patient.Marland Kitchen Appears in no distress. Vitals Time Taken: 9:48 AM, Height: 70 in, Weight: 198 lbs, BMI: 28.4, Temperature: 98.4 F, Pulse: 74 bpm, Respiratory Rate: 18 breaths/min, Blood Pressure: 148/88  mmHg. General Notes: Wound exam; I think the overall wound volume is probably about the same. He has a substantial tunneling area at 12:00 but were measuring this even larger than last time over 7 cm. There is no purulent drainage. He has erythema around the wound but this is much more compatible with Candida rather than any cellulitis. This was aggravated by the wound VAC drape. There is no surrounding tenderness no crepitus. The tunnel does not probe to bone. Integumentary (Hair, Skin) Wound #1 status is Open. Original cause of wound was Surgical Injury. The wound is located on the Left Trochanter. The wound measures 2cm length x 1.5cm width x 2.2cm depth; 2.356cm^2 area and 5.184cm^3 volume. There is muscle and Fat Layer (Subcutaneous Tissue) exposed. Tunneling has been noted at 12:00 with a maximum distance of 7.8cm. Undermining begins at 9:00 and ends at 1:00 with a maximum distance of 4cm. There is a medium amount of serous drainage noted. The wound margin is well defined and not attached to the wound base. There is large (67-100%) pink, pale granulation within the wound bed. There is a small (1-33%) amount of necrotic tissue within the wound bed including Adherent Slough. Assessment Active Problems ICD-10 Pressure ulcer of left hip, stage 4 Disruption of external operation (surgical) wound, not elsewhere classified, subsequent encounter Candidiasis of skin and nail Plan Follow-up Appointments: Return Appointment in 2 weeks. Dressing Change Frequency: Wound #1 Left Trochanter: Change dressing every day. Skin Barriers/Peri-Wound Care: Wound #1 Left Trochanter: Antifungal cream - lotrimin cream antifungal apply daily around the wound. in clinic to apply ketoconazole. Wound Cleansing: Wound #1 Left Trochanter: May shower and wash wound with soap and water. Primary Wound Dressing: Wound #1 Left Trochanter: Other: - apply anascept gel or silvasorb gel wet to dry dressing  daily. Secondary Dressing: Wound #1 Left Trochanter: ABD pad Negative Presssure Wound Therapy: Wound #1 Left Trochanter: Other: - discontinue the wound vac. patient to call and return wound vac to company. Off-Loading: Other: - stay off of left hip to aid in offloading pressure. Home Health: Discontinue home health - discontinue home health [facility]. 1. Use over-the-counter Lotrimin cream to the periwound erythema 2. We are going to go back to a wet-to-dry-based dressing. The patient will not allow continuation of the wound VAC. 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 Certainly I agree that he cannot have a wound VAC in that setting. 3. I prefer silver sorb wet-to-dry which will be a daily change. He wants home health to show his wife how to do this 1 time. We are going to order supplies through him through [employer] Electronic Signature(s) Signed: 02/10/2020 4:51:06 PM By: [name] MD Entered By: [name] on 02/10/2020 10:18:38 -------------------------------------------------------------------------------- SuperBill Details Patient Name: Date of Service: Ivan [name], Ivan BERT W. 02/10/2020 Medical Record Number: [id number] Patient Account Number: [id number] Date of Birth/Sex: Treating RN: [birth year] (74 y.o. [name] Primary Care Provider: [name] Other Clinician: Referring Provider: Treating Provider/Extender: Ronnie Derby, [name] in Treatment: 28 Diagnosis Coding ICD-10 Codes Code Description [id number] Pressure ulcer of left hip, stage 4 T81.31XD Disruption of external operation (surgical) wound, not elsewhere classified, subsequent encounter B37.2 Candidiasis of skin and nail Facility Procedures The patient participates with Medicare or their insurance follows the Medicare Facility Guidelines: CPT4 Code Description Modifier Quantity [id number] Ivan [name] 3 EST PT 1 Physician  Procedures : CPT4 Code Description Modifier [id number] - WC PHYS LEVEL 3 - EST PT ICD-10 Diagnosis Description L89.224 Pressure ulcer of left hip, stage 4 T81.31XD Disruption of external operation (surgical) wound, not elsewhere classified, subsequent encounter  B37.2 Candidiasis of skin and nail Quantity: 1 Electronic Signature(s) Signed: 02/10/2020 4:51:06 PM By: [name] MD Entered By: [name] on 02/10/2020 10:18:57

## 2020-02-11 NOTE — Progress Notes (Signed)
GERVIS, GABA ([id number]) Visit Report for 02/10/2020 Arrival Information Details Patient Name: Date of Service: Ivan [name]. 02/10/2020 9:30 Ivan [name] Medical Record Number: [id number] Patient Account Number: [id number] Date of Birth/Sex: Treating RN: [birth year] (74 y.o. Ivan [name]) Carlene Coria Primary Care Ivan [name]: [name] Other Clinician: Referring Kambre Messner: Treating Ivan [name]/Extender: Fonnie Jarvis in Treatment: 62 Visit Information History Since Last Visit All ordered tests and consults were completed: No Patient Arrived: Ambulatory Added or deleted any medications: No Arrival Time: 09:48 Any new allergies or adverse reactions: No Accompanied By: self Had Ivan fall or experienced change in No Transfer Assistance: None activities of daily living that may affect Patient Identification Verified: Yes risk of falls: Secondary Verification Process Completed: Yes Signs or symptoms of abuse/neglect since last visito No Patient Requires Transmission-Based Precautions: No Hospitalized since last visit: No Patient Has Alerts: No Implantable device outside of the clinic excluding No cellular tissue based products placed in the center since last visit: Has Dressing in Place as Prescribed: Yes Pain Present Now: No Electronic Signature(s) Signed: 02/11/2020 4:20:16 PM By: Carlene Coria RN Entered By: Carlene Coria on 02/10/2020 09:48:24 -------------------------------------------------------------------------------- Clinic Level of Care Assessment Details Patient Name: Date of Service: Ivan [name]. 02/10/2020 9:30 Ivan [name] Medical Record Number: [id number] Patient Account Number: [id number] Date of Birth/Sex: Treating RN: 1946-03-31 (74 y.o. Ivan [name] Primary Care Ivan [name]: [name] Other Clinician: Referring Jaquari Reckner: Treating Ivan [name]: Fonnie Jarvis in Treatment: 28 Clinic Level of Care Assessment Items TOOL 4  Quantity Score X- 1 0 Use when only an EandM is performed on FOLLOW-UP visit ASSESSMENTS - Nursing Assessment / Reassessment []  - 0 Reassessment of Co-morbidities (includes updates in patient status) []  - 0 Reassessment of Adherence to Treatment Plan ASSESSMENTS - Wound and Skin Ivan ssessment / Reassessment []  - 0 Simple Wound Assessment / Reassessment - one wound X- 1 5 Complex Wound Assessment / Reassessment - multiple wounds X- 1 10 Dermatologic / Skin Assessment (not related to wound area) ASSESSMENTS - Focused Assessment []  - 0 Circumferential Edema Measurements - multi extremities X- 1 10 Nutritional Assessment / Counseling / Intervention []  - 0 Lower Extremity Assessment (monofilament, tuning fork, pulses) []  - 0 Peripheral Arterial Disease Assessment (using hand held doppler) ASSESSMENTS - Ostomy and/or Continence Assessment and Care []  - 0 Incontinence Assessment and Management []  - 0 Ostomy Care Assessment and Management (repouching, etc.) PROCESS - Coordination of Care []  - 0 Simple Patient / Family Education for ongoing care X- 1 20 Complex (extensive) Patient / Family Education for ongoing care X- 1 10 Staff obtains Programmer, systems, Records, T Results / Process Orders est X- 1 10 Staff telephones HHA, Nursing Homes / Clarify orders / etc []  - 0 Routine Transfer to another Facility (non-emergent condition) []  - 0 Routine Hospital Admission (non-emergent condition) []  - 0 New Admissions / Biomedical engineer / Ordering NPWT Apligraf, etc. , []  - 0 Emergency Hospital Admission (emergent condition) []  - 0 Simple Discharge Coordination X- 1 15 Complex (extensive) Discharge Coordination PROCESS - Special Needs []  - 0 Pediatric / Minor Patient Management []  - 0 Isolation Patient Management []  - 0 Hearing / Language / Visual special needs []  - 0 Assessment of Community assistance (transportation, D/C planning, etc.) []  - 0 Additional assistance / Altered  mentation []  - 0 Support Surface(s) Assessment (bed, cushion, seat, etc.) INTERVENTIONS - Wound Cleansing / Measurement []  - 0 Simple Wound Cleansing - one wound X-  1 5 Complex Wound Cleansing - multiple wounds X- 1 5 Wound Imaging (photographs - any number of wounds) []  - 0 Wound Tracing (instead of photographs) []  - 0 Simple Wound Measurement - one wound X- 1 5 Complex Wound Measurement - multiple wounds INTERVENTIONS - Wound Dressings []  - 0 Small Wound Dressing one or multiple wounds X- 1 15 Medium Wound Dressing one or multiple wounds []  - 0 Large Wound Dressing one or multiple wounds []  - 0 Application of Medications - topical []  - 0 Application of Medications - injection INTERVENTIONS - Miscellaneous []  - 0 External ear exam []  - 0 Specimen Collection (cultures, biopsies, blood, body fluids, etc.) []  - 0 Specimen(s) / Culture(s) sent or taken to Lab for analysis []  - 0 Patient Transfer (multiple staff / Civil Service fast streamer / Similar devices) []  - 0 Simple Staple / Suture removal (25 or less) []  - 0 Complex Staple / Suture removal (26 or more) []  - 0 Hypo / Hyperglycemic Management (close monitor of Blood Glucose) []  - 0 Ankle / Brachial Index (ABI) - do not check if billed separately X- 1 5 Vital Signs Has the patient been seen at the hospital within the last three years: Yes Total Score: 115 Level Of Care: New/Established - Level 3 Electronic Signature(s) Signed: 02/10/2020 5:01:36 PM By: [name] Entered By: [name] on 02/10/2020 10:15:51 -------------------------------------------------------------------------------- Encounter Discharge Information Details Patient Name: Date of Service: Ivan [name], Ivan Ivan W. 02/10/2020 9:30 Ivan [name] Medical Record Number: [id number] Patient Account Number: [id number] Date of Birth/Sex: Treating RN: 01/27/1946 (74 y.o. Ivan [name] Primary Care Naketa Daddario: [name] Other Clinician: Referring Cordella Nyquist: Treating  Ivan [name]/Extender: Fonnie Jarvis in Treatment: 28 Encounter Discharge Information Items Discharge Condition: Stable Ambulatory Status: Ambulatory Discharge Destination: Home Transportation: Private Auto Accompanied By: self Schedule Follow-up Appointment: Yes Clinical Summary of Care: Patient Declined Electronic Signature(s) Signed: 02/10/2020 4:42:15 PM By: Baruch Gouty RN, BSN Entered By: Baruch Gouty on 02/10/2020 10:15:17 -------------------------------------------------------------------------------- Multi Wound Chart Details Patient Name: Date of Service: Ivan [name], Ivan Ivan W. 02/10/2020 9:30 Ivan [name] Medical Record Number: [id number] Patient Account Number: [id number] Date of Birth/Sex: Treating RN: [birth year] (74 y.o. Ivan [name] Primary Care Dace Denn: [name] Other Clinician: Referring Schawn Byas: Treating Nehemiah Montee/Extender: Ronnie Derby, [name] in Treatment: 28 Vital Signs Height(in): 70 Pulse(bpm): 20 Weight(lbs): 198 Blood Pressure(mmHg): 148/88 Body Mass Index(BMI): 28 Temperature(F): 98.4 Respiratory Rate(breaths/min): 18 Photos: [1:No Photos Left Trochanter] [N/Ivan:N/Ivan N/Ivan] Wound Location: [1:Surgical Injury] [N/Ivan:N/Ivan] Wounding Event: [1:Pressure Ulcer] [N/Ivan:N/Ivan] Primary Etiology: [1:Open Surgical Wound] [N/Ivan:N/Ivan] Secondary Etiology: [1:Cataracts, Coronary Artery Disease, N/Ivan] Comorbid History: [1:Hypertension, Myocardial Infarction, Osteoarthritis, Osteomyelitis 07/01/2019] [N/Ivan:N/Ivan] Date Acquired: [1:28] [N/Ivan:N/Ivan] Weeks of Treatment: [1:Open] [N/Ivan:N/Ivan] Wound Status: [1:2x1.5x2.2] [N/Ivan:N/Ivan] Measurements L x W x D (cm) [1:2.356] [N/Ivan:N/Ivan] Ivan (cm) : rea [1:5.184] [N/Ivan:N/Ivan] Volume (cm) : [1:89.10%] [N/Ivan:N/Ivan] % Reduction in Ivan rea: [1:82.80%] [N/Ivan:N/Ivan] % Reduction in Volume: [1:12] Position 1 (o'clock): [1:7.8] Maximum Distance 1 (cm): [1:9] Starting Position 1 (o'clock): [1:1] Ending Position 1 (o'clock):  [1:4] Maximum Distance 1 (cm): [1:Yes] [N/Ivan:N/Ivan] Tunneling: [1:Yes] [N/Ivan:N/Ivan] Undermining: [1:Category/Stage IV] [N/Ivan:N/Ivan] Classification: [1:Medium] [N/Ivan:N/Ivan] Exudate Ivan mount: [1:Serous] [N/Ivan:N/Ivan] Exudate Type: [1:amber] [N/Ivan:N/Ivan] Exudate Color: [1:Well defined, not attached] [N/Ivan:N/Ivan] Wound Margin: [1:Large (67-100%)] [N/Ivan:N/Ivan] Granulation Ivan mount: [1:Pink, Pale] [N/Ivan:N/Ivan] Granulation Quality: [1:Small (1-33%)] [N/Ivan:N/Ivan] Necrotic Ivan mount: [1:Fat Layer (Subcutaneous Tissue): Yes N/Ivan] Exposed Structures: [1:Muscle: Yes Fascia: No Tendon: No Joint: No Bone: No Medium (34-66%)] [N/Ivan:N/Ivan] Treatment Notes Wound #1 (Left Trochanter) 3.  Primary Dressing Applied Other primary dressing (specifiy in notes) 4. Secondary Dressing Foam Border Dressing Notes anasept gel moistened gauze packing Electronic Signature(s) Signed: 02/10/2020 4:51:06 PM By: [name] MD Signed: 02/10/2020 5:01:36 PM By: [name] Entered By: [name] on 02/10/2020 10:14:20 -------------------------------------------------------------------------------- Multi-Disciplinary Care Plan Details Patient Name: Date of Service: Ivan [name], Ivan Ivan W. 02/10/2020 9:30 Ivan [name] Medical Record Number: [id number] Patient Account Number: [id number] Date of Birth/Sex: Treating RN: Jun 05, 1945 (74 y.o. Ivan [name] Primary Care Kaylina Cahue: [name] Other Clinician: Referring Carlyn Lemke: Treating Juwon Scripter/Extender: Ronnie Derby, [name] in Treatment: 28 Active Inactive Wound/Skin Impairment Nursing Diagnoses: Knowledge deficit related to ulceration/compromised skin integrity Goals: Patient/caregiver will verbalize understanding of skin care regimen Date Initiated: 07/29/2019 Target Resolution Date: 03/10/2020 Goal Status: Active Interventions: Assess patient/caregiver ability to obtain necessary supplies Assess patient/caregiver ability to perform ulcer/skin care regimen upon admission and as  needed Provide education on ulcer and skin care Treatment Activities: Skin care regimen initiated : 07/29/2019 Topical wound management initiated : 07/29/2019 Notes: Electronic Signature(s) Signed: 02/10/2020 5:01:36 PM By: [name] Entered By: [name] on 02/10/2020 09:59:43 -------------------------------------------------------------------------------- Pain Assessment Details Patient Name: Date of Service: Ivan [name], Ivan Ivan W. 02/10/2020 9:30 Ivan [name] Medical Record Number: [id number] Patient Account Number: [id number] Date of Birth/Sex: Treating RN: 05-28-1945 (73 y.o. Ivan [name]) Carlene Coria Primary Care Tomeeka Plaugher: [name] Other Clinician: Referring Rylee Huestis: Treating Courtny Bennison/Extender: Ronnie Derby, [name] in Treatment: 28 Active Problems Location of Pain Severity and Description of Pain Patient Has Paino No Site Locations Pain Management and Medication Current Pain Management: Electronic Signature(s) Signed: 02/11/2020 4:20:16 PM By: Carlene Coria RN Entered By: Carlene Coria on 02/10/2020 09:48:57 -------------------------------------------------------------------------------- Patient/Caregiver Education Details Patient Name: Date of Service: Ivan [name], Ivan Ivan W. 9/30/2021andnbsp9:30 Ivan [name] Medical Record Number: [id number] Patient Account Number: [id number] Date of Birth/Gender: Treating RN: 29-May-1945 (74 y.o. Ivan [name] Primary Care Physician: [name] Other Clinician: Referring Physician: Treating Physician/Extender: Fonnie Jarvis in Treatment: 28 Education Assessment Education Provided To: Patient Education Topics Provided Wound/Skin Impairment: Handouts: Skin Care Do's and Dont's Methods: Explain/Verbal Responses: Reinforcements needed Electronic Signature(s) Signed: 02/10/2020 5:01:36 PM By: [name] Entered By: [name] on 02/10/2020  09:59:53 -------------------------------------------------------------------------------- Wound Assessment Details Patient Name: Date of Service: Ivan [name], Ivan Ivan W. 02/10/2020 9:30 Ivan [name] Medical Record Number: [id number] Patient Account Number: [id number] Date of Birth/Sex: Treating RN: 24-Dec-1945 (73 y.o. Ivan [name]) Carlene Coria Primary Care Nerissa Constantin: [name] Other Clinician: Referring Walden Statz: Treating Alinda Egolf/Extender: Ronnie Derby, [name] in Treatment: 28 Wound Status Wound Number: 1 Primary Pressure Ulcer Etiology: Wound Location: Left Trochanter Secondary Open Surgical Wound Wounding Event: Surgical Injury Etiology: Date Acquired: 07/01/2019 Wound Open Weeks Of Treatment: 28 Status: Clustered Wound: No Comorbid Cataracts, Coronary Artery Disease, Hypertension, Myocardial History: Infarction, Osteoarthritis, Osteomyelitis Wound Measurements Length: (cm) 2 Width: (cm) 1.5 Depth: (cm) 2.2 Area: (cm) 2.356 Volume: (cm) 5.184 % Reduction in Area: 89.1% % Reduction in Volume: 82.8% Epithelialization: Medium (34-66%) Tunneling: Yes Position (o'clock): 12 Maximum Distance: (cm) 7.8 Undermining: Yes Starting Position (o'clock): 9 Ending Position (o'clock): 1 Maximum Distance: (cm) 4 Wound Description Classification: Category/Stage IV Wound Margin: Well defined, not attached Exudate Amount: Medium Exudate Type: Serous Exudate Color: amber Wound Bed Granulation Amount: Large (67-100%) Granulation Quality: Pink, Pale Necrotic Amount: Small (1-33%) Necrotic Quality: Adherent Slough Foul Odor After Cleansing: No Slough/Fibrino Yes Exposed Structure Fascia Exposed: No Fat Layer (Subcutaneous Tissue) Exposed: Yes Tendon Exposed: No Muscle Exposed:  Yes Necrosis of Muscle: No Joint Exposed: No Bone Exposed: No Treatment Notes Wound #1 (Left Trochanter) 3. Primary Dressing Applied Other primary dressing (specifiy in notes) 4. Secondary  Dressing Foam Border Dressing Notes anasept gel moistened gauze packing Electronic Signature(s) Signed: 02/10/2020 5:01:36 PM By: [name] Signed: 02/11/2020 4:20:16 PM By: Carlene Coria RN Entered By: [name] on 02/10/2020 09:59:30 -------------------------------------------------------------------------------- Vitals Details Patient Name: Date of Service: Ivan [name], Ivan Ivan W. 02/10/2020 9:30 Ivan [name] Medical Record Number: [id number] Patient Account Number: [id number] Date of Birth/Sex: Treating RN: [birth year] (73 y.o. Ivan [name]) Carlene Coria Primary Care Madolin Twaddle: [name] Other Clinician: Referring Quincey Nored: Treating Felicidad Sugarman/Extender: Ronnie Derby, [name] in Treatment: 28 Vital Signs Time Taken: 09:48 Temperature (F): 98.4 Height (in): 70 Pulse (bpm): 74 Weight (lbs): 198 Respiratory Rate (breaths/min): 18 Body Mass Index (BMI): 28.4 Blood Pressure (mmHg): 148/88 Reference Range: 80 - 120 mg / dl Electronic Signature(s) Signed: 02/11/2020 4:20:16 PM By: Carlene Coria RN Entered By: Carlene Coria on 02/10/2020 09:48:52

## 2020-02-12 LAB — QUANTIFERON-TB GOLD PLUS
QuantiFERON Mitogen Value: >10 IU/mL
QuantiFERON Nil Value: 0.02 IU/mL
QuantiFERON TB1 Ag Value: 0.8 IU/mL
QuantiFERON TB2 Ag Value: 0.84 IU/mL
QuantiFERON-TB Gold Plus: POSITIVE — AB

## 2020-02-13 ENCOUNTER — Ambulatory Visit (INDEPENDENT_AMBULATORY_CARE_PROVIDER_SITE_OTHER): Payer: Medicare HMO

## 2020-02-13 ENCOUNTER — Encounter (HOSPITAL_COMMUNITY): Payer: Self-pay | Admitting: *Deleted

## 2020-02-13 ENCOUNTER — Other Ambulatory Visit: Payer: Self-pay

## 2020-02-13 ENCOUNTER — Ambulatory Visit (HOSPITAL_COMMUNITY)
Admission: EM | Admit: 2020-02-13 | Discharge: 2020-02-13 | Disposition: A | Discharge status: Home / Self Care | Payer: Medicare HMO | Attending: Family Medicine | Admitting: Family Medicine

## 2020-02-13 DIAGNOSIS — R7612 Nonspecific reaction to cell mediated immunity measurement of gamma interferon antigen response without active tuberculosis: Secondary | ICD-10-CM

## 2020-02-13 DIAGNOSIS — R7611 Nonspecific reaction to tuberculin skin test without active tuberculosis: Secondary | ICD-10-CM | POA: Diagnosis not present

## 2020-02-13 HISTORY — DX: COVID-19: U07.1

## 2020-02-13 HISTORY — DX: Contact with and (suspected) exposure to tuberculosis: Z20.1

## 2020-02-13 NOTE — ED Triage Notes (Signed)
Pt denies any sxs.  States he has had multiple CXRs for positive skin test, but reports he has received do not include his name and DOB, so employer is requiring CXR.
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
## 2020-02-13 NOTE — Discharge Instructions (Signed)
Your x ray was normal Follow up as needed for continued or worsening symptoms

## 2020-02-14 ENCOUNTER — Other Ambulatory Visit: Payer: Self-pay | Admitting: Family Medicine

## 2020-02-14 ENCOUNTER — Telehealth: Payer: Self-pay | Admitting: Family Medicine

## 2020-02-14 DIAGNOSIS — R7612 Nonspecific reaction to cell mediated immunity measurement of gamma interferon antigen response without active tuberculosis: Secondary | ICD-10-CM

## 2020-02-14 NOTE — Progress Notes (Signed)
CXR for positive quant gold

## 2020-02-14 NOTE — Telephone Encounter (Signed)
Attempted to call patient.  No answer, left VM to call back.   When calls back, please let him know I have ordered a chest x-ray to have completed as his screening test for TB was positive.  He can go to [facility] in [place] center at any time to have this completed (doesn't need appointment).  I'll be in touch after the results of the X-ray.

## 2020-02-16 NOTE — Telephone Encounter (Signed)
Spoke with patient. He had positive quant gold and negative chest x-ray, therefore consistent with latent TB.  He does not think that he has had a positive quant gold previously, but also thinks this is the first time it was checked (can't find another result in chart).  He has previously seen ID many years ago (Dr. Megan Salon) and at that time was told that he didn't need treatment.  Because records are scarce, do not want the patient to be re-treated for latent TB if this has already been completed, even though patient thinks it has not, and don't want him to go untreated.  Would not send to Health Dept and muddy the waters at this time.  He has an established relationship with Dr. Tommy Medal, therefore discussed with the patient going back to see him in regards to his positive quant gold with negative chest x-ray to discuss treatment for latent TB.  He agrees to this, and states that he will call.  We will also forward this message to Dr. Tommy Medal to explain this unclear situation and ensure that he is aware of reason for visit.

## 2020-02-16 NOTE — Telephone Encounter (Signed)
Patient returns call to nurse line. Patient informed of below. However, patient reports that he went to [facility] on [facility]/20 and received a chest x-ray at that time. Results showed no active disease.   Talbot Grumbling, RN

## 2020-02-21 NOTE — Telephone Encounter (Signed)
If he has a new QuantiFERON gold and has never been treated for latent tuberculosis then he does need to be treated either through the health department or through [place].  Note I have no clinic availability for the next 2 weeks so he will need to be seen by different provider if this wants to be initiated sooner than that

## 2020-02-22 ENCOUNTER — Telehealth: Payer: Self-pay | Admitting: Family Medicine

## 2020-02-22 ENCOUNTER — Other Ambulatory Visit (HOSPITAL_COMMUNITY)
Admission: RE | Admit: 2020-02-22 | Discharge: 2020-02-22 | Disposition: A | Discharge status: Home / Self Care | Payer: Medicare HMO | Source: Other Acute Inpatient Hospital | Attending: Internal Medicine | Admitting: Internal Medicine

## 2020-02-22 ENCOUNTER — Encounter (HOSPITAL_BASED_OUTPATIENT_CLINIC_OR_DEPARTMENT_OTHER): Payer: Medicare HMO | Attending: Internal Medicine | Admitting: Internal Medicine

## 2020-02-22 DIAGNOSIS — S7002XA Contusion of left hip, initial encounter: Secondary | ICD-10-CM | POA: Insufficient documentation

## 2020-02-22 DIAGNOSIS — L89224 Pressure ulcer of left hip, stage 4: Secondary | ICD-10-CM | POA: Insufficient documentation

## 2020-02-22 DIAGNOSIS — X58XXXA Exposure to other specified factors, initial encounter: Secondary | ICD-10-CM | POA: Insufficient documentation

## 2020-02-22 DIAGNOSIS — K6812 Psoas muscle abscess: Secondary | ICD-10-CM | POA: Insufficient documentation

## 2020-02-22 DIAGNOSIS — I252 Old myocardial infarction: Secondary | ICD-10-CM | POA: Diagnosis not present

## 2020-02-22 DIAGNOSIS — T8131XD Disruption of external operation (surgical) wound, not elsewhere classified, subsequent encounter: Secondary | ICD-10-CM | POA: Insufficient documentation

## 2020-02-22 DIAGNOSIS — Z8616 Personal history of COVID-19: Secondary | ICD-10-CM | POA: Diagnosis not present

## 2020-02-22 DIAGNOSIS — B372 Candidiasis of skin and nail: Secondary | ICD-10-CM | POA: Diagnosis not present

## 2020-02-22 DIAGNOSIS — I10 Essential (primary) hypertension: Secondary | ICD-10-CM | POA: Diagnosis not present

## 2020-02-22 NOTE — Telephone Encounter (Signed)
Call patient, no answer, left voicemail.  Patient will need to be treated for latent tuberculosis per Dr. Derek Mound instructions.  He can go to infectious disease office or can go to [facility].  Advised to call back if he has questions.  If he calls back, he can be advised that he will need to be treated for latent tuberculosis and can do so at the health department or at infectious disease, but Dr. Tommy Medal does not have any openings for the next 2 weeks.  Also asked that he please keep [place] in the loop if he goes to the health department as we cannot see those records.

## 2020-02-22 NOTE — Progress Notes (Signed)
MUSTAPHA, COLSON ([id number]) Visit Report for 02/22/2020 Arrival Information Details Patient Name: Date of Service: Ivan [name]. 02/22/2020 9:45 A M Medical Record Number: [id number] Patient Account Number: [id number] Date of Birth/Sex: Treating RN: 03-26-1946 (73 y.o. [name]) Carlene Coria Primary Care Darielys Giglia: [name] Other Clinician: Referring Arlyne Brandes: Treating Jammie Troup/Extender: Fonnie Jarvis in Treatment: 29 Visit Information History Since Last Visit Added or deleted any medications: No Patient Arrived: Ambulatory Any new allergies or adverse reactions: No Arrival Time: 10:11 Had a fall or experienced change in No Accompanied By: self activities of daily living that may affect Transfer Assistance: None risk of falls: Patient Identification Verified: Yes Signs or symptoms of abuse/neglect since last visito No Secondary Verification Process Completed: Yes Hospitalized since last visit: No Patient Requires Transmission-Based Precautions: No Implantable device outside of the clinic excluding No Patient Has Alerts: No cellular tissue based products placed in the center since last visit: Has Dressing in Place as Prescribed: Yes Pain Present Now: Yes Electronic Signature(s) Signed: 02/22/2020 11:10:35 AM By: Sandre Kitty Entered By: Sandre Kitty on 02/22/2020 10:11:33 -------------------------------------------------------------------------------- Clinic Level of Care Assessment Details Patient Name: Date of Service: Ivan [name]. 02/22/2020 9:45 A M Medical Record Number: [id number] Patient Account Number: [id number] Date of Birth/Sex: Treating RN: 1946/03/08 (73 y.o. [name]) Carlene Coria Primary Care Ka Flammer: [name] Other Clinician: Referring Hoover Grewe: Treating Deola Rewis/Extender: Fonnie Jarvis in Treatment: 29 Clinic Level of Care Assessment Items TOOL 4 Quantity Score X- 1 0 Use when only an  EandM is performed on FOLLOW-UP visit ASSESSMENTS - Nursing Assessment / Reassessment X- 1 10 Reassessment of Co-morbidities (includes updates in patient status) X- 1 5 Reassessment of Adherence to Treatment Plan ASSESSMENTS - Wound and Skin A ssessment / Reassessment X - Simple Wound Assessment / Reassessment - one wound 1 5 []  - 0 Complex Wound Assessment / Reassessment - multiple wounds []  - 0 Dermatologic / Skin Assessment (not related to wound area) ASSESSMENTS - Focused Assessment []  - 0 Circumferential Edema Measurements - multi extremities []  - 0 Nutritional Assessment / Counseling / Intervention []  - 0 Lower Extremity Assessment (monofilament, tuning fork, pulses) []  - 0 Peripheral Arterial Disease Assessment (using hand held doppler) ASSESSMENTS - Ostomy and/or Continence Assessment and Care []  - 0 Incontinence Assessment and Management []  - 0 Ostomy Care Assessment and Management (repouching, etc.) PROCESS - Coordination of Care X - Simple Patient / Family Education for ongoing care 1 15 []  - 0 Complex (extensive) Patient / Family Education for ongoing care X- 1 10 Staff obtains Consents, Records, T Results / Process Orders est []  - 0 Staff telephones HHA, Nursing Homes / Clarify orders / etc []  - 0 Routine Transfer to another Facility (non-emergent condition) []  - 0 Routine Hospital Admission (non-emergent condition) []  - 0 New Admissions / Biomedical engineer / Ordering NPWT Apligraf, etc. , []  - 0 Emergency Hospital Admission (emergent condition) X- 1 10 Simple Discharge Coordination []  - 0 Complex (extensive) Discharge Coordination PROCESS - Special Needs []  - 0 Pediatric / Minor Patient Management []  - 0 Isolation Patient Management []  - 0 Hearing / Language / Visual special needs []  - 0 Assessment of Community assistance (transportation, D/C planning, etc.) []  - 0 Additional assistance / Altered mentation []  - 0 Support Surface(s)  Assessment (bed, cushion, seat, etc.) INTERVENTIONS - Wound Cleansing / Measurement X - Simple Wound Cleansing - one wound 1 5 []  - 0 Complex Wound Cleansing -  multiple wounds X- 1 5 Wound Imaging (photographs - any number of wounds) []  - 0 Wound Tracing (instead of photographs) X- 1 5 Simple Wound Measurement - one wound []  - 0 Complex Wound Measurement - multiple wounds INTERVENTIONS - Wound Dressings []  - 0 Small Wound Dressing one or multiple wounds X- 1 15 Medium Wound Dressing one or multiple wounds []  - 0 Large Wound Dressing one or multiple wounds X- 1 5 Application of Medications - topical []  - 0 Application of Medications - injection INTERVENTIONS - Miscellaneous []  - 0 External ear exam []  - 0 Specimen Collection (cultures, biopsies, blood, body fluids, etc.) []  - 0 Specimen(s) / Culture(s) sent or taken to Lab for analysis []  - 0 Patient Transfer (multiple staff / Civil Service fast streamer / Similar devices) []  - 0 Simple Staple / Suture removal (25 or less) []  - 0 Complex Staple / Suture removal (26 or more) []  - 0 Hypo / Hyperglycemic Management (close monitor of Blood Glucose) []  - 0 Ankle / Brachial Index (ABI) - do not check if billed separately X- 1 5 Vital Signs Has the patient been seen at the hospital within the last three years: Yes Total Score: 95 Level Of Care: New/Established - Level 3 Electronic Signature(s) Signed: 02/22/2020 5:49:00 PM By: Carlene Coria RN Entered By: Carlene Coria on 02/22/2020 10:59:43 -------------------------------------------------------------------------------- Encounter Discharge Information Details Patient Name: Date of Service: Ivan [name], Ivan BERT W. 02/22/2020 9:45 A M Medical Record Number: [id number] Patient Account Number: [id number] Date of Birth/Sex: Treating RN: 1945-12-04 (74 y.o. [name] Primary Care Meah Jiron: [name] Other Clinician: Referring Abas Leicht: Treating Sherrise Liberto/Extender: Fonnie Jarvis in Treatment: 29 Encounter Discharge Information Items Discharge Condition: Stable Ambulatory Status: Ambulatory Discharge Destination: Home Transportation: Private Auto Accompanied By: self Schedule Follow-up Appointment: Yes Clinical Summary of Care: Electronic Signature(s) Signed: 02/22/2020 5:27:04 PM By: [name] Entered By: [name] on 02/22/2020 10:57:25 -------------------------------------------------------------------------------- Lower Extremity Assessment Details Patient Name: Date of Service: Ivan [name], Ivan BERT W. 02/22/2020 9:45 A M Medical Record Number: [id number] Patient Account Number: [id number] Date of Birth/Sex: Treating RN: 18-Jul-1945 (74 y.o. [name] Primary Care Ashiah Karpowicz: [name] Other Clinician: Referring Gaila Engebretsen: Treating Jordyn Hofacker/Extender: Fonnie Jarvis in Treatment: 29 Electronic Signature(s) Signed: 02/22/2020 4:37:34 PM By: Baruch Gouty RN, BSN Entered By: Baruch Gouty on 02/22/2020 10:21:08 -------------------------------------------------------------------------------- Multi Wound Chart Details Patient Name: Date of Service: Ivan [name], Ivan BERT W. 02/22/2020 9:45 A M Medical Record Number: [id number] Patient Account Number: [id number] Date of Birth/Sex: Treating RN: 15-Jul-1945 (73 y.o. [name]) Carlene Coria Primary Care Jelina Paulsen: [name] Other Clinician: Referring Arnelle Nale: Treating Delyla Sandeen/Extender: Ronnie Derby, [name] in Treatment: 29 Vital Signs Height(in): 70 Pulse(bpm): 60 Weight(lbs): 198 Blood Pressure(mmHg): 137/76 Body Mass Index(BMI): 28 Temperature(F): 98.3 Respiratory Rate(breaths/min): 18 Photos: [1:No Photos Left Trochanter] [N/A:N/A N/A] Wound Location: [1:Surgical Injury] [N/A:N/A] Wounding Event: [1:Pressure Ulcer] [N/A:N/A] Primary Etiology: [1:Open Surgical Wound] [N/A:N/A] Secondary Etiology: [1:Cataracts, Coronary  Artery Disease, N/A] Comorbid History: [1:Hypertension, Myocardial Infarction, Osteoarthritis, Osteomyelitis 07/01/2019] [N/A:N/A] Date Acquired: [1:29] [N/A:N/A] Weeks of Treatment: [1:Open] [N/A:N/A] Wound Status: [1:2x1.5x2.6] [N/A:N/A] Measurements L x W x D (cm) [1:2.356] [N/A:N/A] A (cm) : rea [1:6.126] [N/A:N/A] Volume (cm) : [1:89.10%] [N/A:N/A] % Reduction in A rea: [1:79.70%] [N/A:N/A] % Reduction in Volume: [1:11] Position 1 (o'clock): [1:7.6] Maximum Distance 1 (cm): [1:12] Position 2 (o'clock): [1:4] Maximum Distance 2 (cm): [1:Yes] [N/A:N/A] Tunneling: [1:Category/Stage IV] [N/A:N/A] Classification: [1:Large] [N/A:N/A] Exudate A mount: [1:Serous] [N/A:N/A] Exudate  Type: [1:amber] [N/A:N/A] Exudate Color: [1:Well defined, not attached] [N/A:N/A] Wound Margin: [1:Large (67-100%)] [N/A:N/A] Granulation A mount: [1:Pink] [N/A:N/A] Granulation Quality: [1:Small (1-33%)] [N/A:N/A] Necrotic A mount: [1:Fat Layer (Subcutaneous Tissue): Yes N/A] Exposed Structures: [1:Muscle: Yes Fascia: No Tendon: No Joint: No Bone: No None] [N/A:N/A] Treatment Notes Wound #1 (Left Trochanter) 1. Cleanse With Wound Cleanser 2. Periwound Care Antifungal cream 3. Primary Dressing Applied Calcium Alginate Ag 4. Secondary Dressing ABD Pad 5. Secured With [employer] [facility] [employer]) Signed: 02/22/2020 5:41:11 PM By: [name] MD Signed: 02/22/2020 5:49:00 PM By: Carlene Coria RN Entered By: [name] on 02/22/2020 11:21:45 -------------------------------------------------------------------------------- Elim Details Patient Name: Date of Service: Ivan [name], Ivan Earthly. 02/22/2020 9:45 A M Medical Record Number: [id number] Patient Account Number: [id number] Date of Birth/Sex: Treating RN: Oct 23, 1945 (73 y.o. Oval Linsey Primary Care Kallan Bischoff: [name] Other Clinician: Referring Eastyn Skalla: Treating Xariah Silvernail/Extender: Ronnie Derby, [name] in Treatment: 29 Active Inactive Wound/Skin Impairment Nursing Diagnoses: Knowledge deficit related to ulceration/compromised skin integrity Goals: Patient/caregiver will verbalize understanding of skin care regimen Date Initiated: 07/29/2019 Target Resolution Date: 03/10/2020 Goal Status: Active Interventions: Assess patient/caregiver ability to obtain necessary supplies Assess patient/caregiver ability to perform ulcer/skin care regimen upon admission and as needed Provide education on ulcer and skin care Treatment Activities: Skin care regimen initiated : 07/29/2019 Topical wound management initiated : 07/29/2019 Notes: Electronic Signature(s) Signed: 02/22/2020 5:49:00 PM By: Carlene Coria RN Entered By: Carlene Coria on 02/22/2020 10:25:47 -------------------------------------------------------------------------------- Pain Assessment Details Patient Name: Date of Service: Ivan [name], Ivan BERT W. 02/22/2020 9:45 A M Medical Record Number: [id number] Patient Account Number: [id number] Date of Birth/Sex: Treating RN: 05/01/1946 (73 y.o. Oval Linsey Primary Care Yanice Maqueda: [name] Other Clinician: Referring Shawndrea Rutkowski: Treating Shakeia Krus/Extender: Ronnie Derby, [name] in Treatment: 29 Active Problems Location of Pain Severity and Description of Pain Patient Has Paino Yes Site Locations Rate the pain. Rate the pain. Current Pain Level: 4 Pain Management and Medication Current Pain Management: Electronic Signature(s) Signed: 02/22/2020 11:10:35 AM By: Sandre Kitty Signed: 02/22/2020 5:49:00 PM By: Carlene Coria RN Entered By: Sandre Kitty on 02/22/2020 10:12:06 -------------------------------------------------------------------------------- Patient/Caregiver Education Details Patient Name: Date of Service: Ivan [name]. 10/12/2021andnbsp9:45 A M Medical Record Number: [id number] Patient Account Number:  [id number] Date of Birth/Gender: Treating RN: Jun 30, 1945 (73 y.o. Oval Linsey Primary Care Physician: [name] Other Clinician: Referring Physician: Treating Physician/Extender: Fonnie Jarvis in Treatment: 29 Education Assessment Education Provided To: Patient Education Topics Provided Wound/Skin Impairment: Methods: Explain/Verbal Responses: State content correctly Electronic Signature(s) Signed: 02/22/2020 5:49:00 PM By: Carlene Coria RN Entered By: Carlene Coria on 02/22/2020 10:26:02 -------------------------------------------------------------------------------- Wound Assessment Details Patient Name: Date of Service: Ivan [name], Ivan BERT W. 02/22/2020 9:45 A M Medical Record Number: [id number] Patient Account Number: [id number] Date of Birth/Sex: Treating RN: 1946-02-21 (74 y.o. [name] Primary Care Reesha Debes: [name] Other Clinician: Referring Susy Placzek: Treating Rosealee Recinos/Extender: Ronnie Derby, [name] in Treatment: 29 Wound Status Wound Number: 1 Primary Pressure Ulcer Etiology: Wound Location: Left Trochanter Secondary Open Surgical Wound Wounding Event: Surgical Injury Etiology: Date Acquired: 07/01/2019 Wound Open Weeks Of Treatment: 29 Status: Clustered Wound: No Comorbid Cataracts, Coronary Artery Disease, Hypertension, Myocardial History: Infarction, Osteoarthritis, Osteomyelitis Photos Photo Uploaded By: Mikeal Hawthorne on 02/22/2020 12:45:57 Wound Measurements Length: (cm) 2 % Width: (cm) 1.5 % Depth: (cm) 2.6 E Area: (cm) 2.356 Volume: (cm) 6.126 Reduction in Area: 89.1% Reduction in Volume: 79.7% pithelialization: None  Tunneling: Yes Location 1 Position (o'clock): 11 Maximum Distance: (cm) 7.6 Location 2 Position (o'clock): 12 Maximum Distance: (cm) 4 Wound Description Classification: Category/Stage IV Wound Margin: Well defined, not attached Exudate Amount: Large Exudate Type:  Serous Exudate Color: amber Foul Odor After Cleansing: No Slough/Fibrino Yes Wound Bed Granulation Amount: Large (67-100%) Exposed Structure Granulation Quality: Pink Fascia Exposed: No Necrotic Amount: Small (1-33%) Fat Layer (Subcutaneous Tissue) Exposed: Yes Necrotic Quality: Adherent Slough Tendon Exposed: No Muscle Exposed: Yes Necrosis of Muscle: No Joint Exposed: No Bone Exposed: No Treatment Notes Wound #1 (Left Trochanter) 1. Cleanse With Wound Cleanser 2. Periwound Care Antifungal cream 3. Primary Dressing Applied Calcium Alginate Ag 4. Secondary Dressing ABD Pad 5. Secured With [employer] [facility] [employer]) Signed: 02/22/2020 4:37:34 PM By: Baruch Gouty RN, BSN Entered By: Baruch Gouty on 02/22/2020 10:27:18 -------------------------------------------------------------------------------- Vitals Details Patient Name: Date of Service: Ivan [name], Ivan BERT W. 02/22/2020 9:45 A M Medical Record Number: [id number] Patient Account Number: [id number] Date of Birth/Sex: Treating RN: [birth year] (73 y.o. [name]) Carlene Coria Primary Care Verneal Wiers: [name] Other Clinician: Referring Perle Gibbon: Treating Cori Justus/Extender: Ronnie Derby, [name] in Treatment: 29 Vital Signs Time Taken: 10:11 Temperature (F): 98.3 Height (in): 70 Pulse (bpm): 78 Weight (lbs): 198 Respiratory Rate (breaths/min): 18 Body Mass Index (BMI): 28.4 Blood Pressure (mmHg): 137/76 Reference Range: 80 - 120 mg / dl Electronic Signature(s) Signed: 02/22/2020 11:10:35 AM By: Sandre Kitty Entered By: Sandre Kitty on 02/22/2020 10:11:55

## 2020-02-22 NOTE — Progress Notes (Signed)
Ivan, [name] ([id number]) Visit Report for 02/22/2020 HPI Details Patient Name: Date of Service: Ivan [name]. 02/22/2020 9:45 A M Medical Record Number: [id number] Patient Account Number: [id number] Date of Birth/Sex: Treating RN: 1945/08/23 (74 y.o. 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 He required admission to hospital from 1/5 through 1/9 with Covid pneumonia. At that point he apparently left AMA. He was noted to have a left hip bruise at the time of discharge. I suspect this really was a deep tissue injury. He was readmitted from 1/13 through 1//27 with altered mental status Covid pneumonia and delirium. At that point he had a left hip and thigh wound which had worsened. Felt to be a pressure injury. He was seen by general surgery who recommended normal saline wet-to-dry twice daily he did not have a debridement. After he left at that point he was incapacitated by increasing lumbar pain. An MRI on 2/9 showed discitis and osteomyelitis at L1/2. He asked aspiration of 1 cm fluid however I think this culture negative. He was also noted at this point to have a very large wound over the left greater trochanter. He was taken to the OR by Dr. Marla Roe for debridement with ACell placement. He was placed in a wound VAC. A culture at that point showed Enterococcus and Enterobacter. An MRI on 3/15 showed no evidence of left hip septic arthritis or osteomyelitis. Extensive subcutaneous edema within the visualized soft tissue of the left thigh and prominent left inguinal lymph nodes likely reactive. He was started on IV cefepime and vancomycin for 6 weeks. He was discharged to [facility] facility as the patient.  There I gather the wound developed the necrotic surface. He was seen by the wound care doctor in house who correctly pointed out that a wound VAC would not do anything with a necrotic surface. Since then he has been using Santyl The patient is now at home. He was on vancomycin and cefepime. He developed hives and a generalized rash and was seen in the ER on 3/15 by Dr. Cain Sieve recommendation he was changed to meropenem however he also apparently developed hives after this. He says the original hives stopped after the cefepime was stopped and follow-up hives only developed after the meropenem. He is only taken 1 dose of the fear of the meropenem and now is on vancomycin. 3/25; the patient was seen by Dr. Linus Salmons on 3/22. Because of intolerances to meropenem which I communicated with Dr. De Burrs about he is now on daptomycin and Cipro until sometime in early April. He seems to be tolerating the antibiotics well. We are using Santyl back by wet-to-dry dressings. And making some improvement in the general surface of this very large wound 4/8; we are using Santyl back by wet-to-dry. We are making progress in terms of prepping the wound for a wound VAC. Still necrotic material requiring debridement. 4/16; we are using Santyl back by wet-to-dry. Not convinced that his wife is packing this properly there is a 6 cm tunnel at 12:00. We brought this to attention. Still requiring mechanical debridement 4/29; 2-week follow-up. In general the wound is better however there is considerable tunneling tunneling to the greater trochanter itself. There is still some debris in the deeper parts of this wound I  did not do mechanical debridement on this today. I am going to change the dressing to quarter strength Dakin's wet-to-dry. We will put a wound VAC through his insurance. His wife has had brain damage from a previous meningioma surgery. She is not comfortable around strangers in her home. The patient is concerned about  this 5/18; patient is using quarter strength Dakin's wet to dry. The problem is a deep pressure area over the left greater trochanter. There was some question from her intake nurses that this is actually come in and some question raised about whether he would require a wound VAC or not. The issue with the wound VAC was complicated predominantly by the patient's reluctance to allow people into his home i.e. home health although he tells me he has this mostly "sorted out" 7/1; this is a patient that I have not seen in almost 6 weeks. Apparently canceled several appointments related to family problems deaths in the family etc. He is wife has ophthalmologic surgery next week. I had been trying to get a wound VAC ordered for this deep probing wound over the left greater trochanter but so far I think he is simply doing a Dakin's wet-to-dry and our intake nurse did not think there was any Dakin's on it. I have a note from Dr. Tommy Medal of infectious disease on 6/30. He has now been off doxycycline and ciprofloxacin for several weeks. He has no undue pain or drainage. He also had discitis and osteomyelitis at L1-L2 with a 1 cm left psoas abscess as well as some increased signal at T10/T11. He is not having any problems related to this. 12/16/2019 on evaluation today patient's wound actually is showing some signs of bit of improvement which is good news. Fortunately there is no signs of active infection at this time which is good news. He tells me that the nurses have been coming to change this 3 times a week and doing a great job. He told the intake nurse that they had only been coming out once a week. I am not really sure what is going on with the discrepancy and stories here. Nonetheless I do believe that he needs to have this changed 3 times a week on a regular basis he does seem to have a little bit of a fungal infection around the wound. 8/19; 2-week follow-up. I have not seen this patient in over 6 weeks.  There is been a dramatic improvement in the overall wound volume. Circumference of the wound is now small although there is still considerable undermining from roughly 9-2 o'clock maximum 1:00 at roughly 4.7 cm. There is no purulent drainage. He has some irritation around the wound on the normal skin. They are using miconazole on this. 9/2; 2-week follow-up. Dimensions of the wound circumference look quite good. There is tunneling at 4.3 cm at 1:00 versus 4.7 last time. The area continues to look like it is contracting with the wound VAC and I think the treatment of choice here will be continuing to the wound VAC as long as it seems practical. There is no evidence of infection. As far as I am aware he is offloading this aggressively 9/16; 2-week follow-up. Left greater trochanter. Dimensions about the same still about 4.9 cm of maximal undermining. Nevertheless the visible tissue that I can see looks healthy. We have a wound VAC and really from the perspective of how this looks initially this is gotten a lot better however not much change in the last week  9/30 2-week follow-up. Left greater trochanter. He has a tunnel at 12:00 that goes about 7 cm. From 10-12 lesser degrees of undermining. HOWEVER he comes in today telling us that he is going to have to go back to work. He will not be able to work with a wound VAC and he wants that discontinued. There was not much discussion. This is a financial issue for him. He also has erythema around the wound orifice which has satellite lesions around it this is no doubt cutaneous candidiasis 10/12; 2-week follow-up. Left greater trochanter. 7.2 cm of tunneling. 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 He has not been systemically unwell. Electronic Signature(s) Signed: 02/22/2020 5:41:11 PM By: [name] MD Entered By: [name] on  02/22/2020 11:23:40 -------------------------------------------------------------------------------- Physical Exam Details Patient Name: Date of Service: Ivan [name], Ivan BERT W. 02/22/2020 9:45 A M Medical Record Number: [id number] Patient Account Number: [id number] Date of Birth/Sex: Treating RN: 1945/05/28 (73 y.o. Oval Linsey Primary Care Provider: [name] Other Clinician: Referring Provider: Treating Provider/Extender: Ronnie Derby, [name] in Treatment: 29 Constitutional Sitting or standing Blood Pressure is within target range for patient.. Pulse regular and within target range for patient.Marland Kitchen Respirations regular, non-labored and within target range.. Temperature is normal and within the target range for the patient.Marland Kitchen Appears in no distress. Respiratory work of breathing is normal. Notes Wound exam; not much change. Still 7.2 cm of maximal tunneling. Because of the complaints of drainage I did a culture of this although there is no surrounding erythema or crepitus. The erythema that was secondary to Candida last time under the wound VAC drape is a lot better he has been using Lotrimin. Nothing required debridement. Electronic Signature(s) Signed: 02/22/2020 5:41:11 PM By: [name] MD Entered By: [name] on 02/22/2020 11:25:29 -------------------------------------------------------------------------------- Physician Orders Details Patient Name: Date of Service: Ivan [name], Ivan BERT W. 02/22/2020 9:45 A M Medical Record Number: [id number] Patient Account Number: [id number] Date of Birth/Sex: Treating RN: Sep 20, 1945 (73 y.o. Oval Linsey Primary Care Provider: [name] Other Clinician: Referring Provider: Treating Provider/Extender: Fonnie Jarvis in Treatment: 29 Verbal / Phone Orders: No Diagnosis Coding ICD-10 Coding Code Description [phone] Pressure ulcer of left hip, stage 4 T81.31XD Disruption of external  operation (surgical) wound, not elsewhere classified, subsequent encounter B37.2 Candidiasis of skin and nail Follow-up Appointments Return appointment in 3 weeks. Dressing Change Frequency Wound #1 Left Trochanter Change dressing three times week. Skin Barriers/Peri-Wound Care Wound #1 Left Trochanter ntifungal cream - lotrimin cream antifungal apply daily around the wound. A in clinic to apply ketoconazole. Wound Cleansing Wound #1 Left Trochanter May shower and wash wound with soap and water. - on days dressing changed Primary Wound Dressing Wound #1 Left Trochanter Calcium Alginate with Silver Secondary Dressing Wound #1 Left Trochanter ABD pad - secure with tape Off-Loading Other: - stay off of left hip to aid in offloading pressure. Laboratory naerobe culture (MICRO) - non healig wound left trocanter - (ICD10 [phone] - Pressure ulcer of Bacteria identified in Unspecified specimen by A left hip, stage 4) LOINC Code: 979-8 Convenience Name: Anerobic culture Electronic Signature(s) Signed: 02/22/2020 5:41:11 PM By: [name] MD Signed: 02/22/2020 5:49:00 PM By: Carlene Coria RN Entered By: Carlene Coria on 02/22/2020 10:52:19 -------------------------------------------------------------------------------- Problem List Details Patient Name: Date of Service: Ivan [name], Ivan BERT W.  02/22/2020 9:45 A M Medical Record Number: [id number] Patient Account Number: [id number] Date of Birth/Sex: Treating RN: [birth year] (74 y.o. [name]) Carlene Coria Primary Care Provider: [name] Other Clinician: Referring Provider: Treating Provider/Extender: Ronnie Derby, [name] in Treatment: 29 Active Problems ICD-10 Encounter Code Description Active Date MDM Diagnosis L89.224 Pressure ulcer of left hip, stage 4 08/05/2019 No Yes T81.31XD Disruption of external operation (surgical) wound, not elsewhere classified, 07/29/2019 No Yes subsequent encounter B37.2 Candidiasis  of skin and nail 02/10/2020 No Yes Inactive Problems Resolved Problems Electronic Signature(s) Signed: 02/22/2020 5:41:11 PM By: [name] MD Entered By: [name] on 02/22/2020 11:21:31 -------------------------------------------------------------------------------- Progress Note Details Patient Name: Date of Service: Ivan [name], Ivan BERT W. 02/22/2020 9:45 A M Medical Record Number: [id number] Patient Account Number: [id number] Date of Birth/Sex: Treating RN: Sep 24, 1945 (73 y.o. 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 He required admission to hospital from 1/5 through 1/9 with Covid pneumonia. At that point he apparently left AMA. He was noted to have a left hip bruise at the time of discharge. I suspect this really was a deep tissue injury. He was readmitted from 1/13 through 1//27 with altered mental status Covid pneumonia and delirium. At that point he had a left hip and thigh wound which had worsened. Felt to be a pressure injury. He was seen by general surgery who recommended normal saline wet-to-dry twice daily he did not have a debridement. After he left at that point he was incapacitated by increasing lumbar pain. An MRI on 2/9 showed discitis and osteomyelitis at L1/2. He asked aspiration of 1 cm fluid however I think this culture negative. He was also noted at this point to have a very large wound over the left greater trochanter. He was taken to the OR by Dr. Marla Roe for debridement with ACell placement. He was placed in a wound VAC. A culture at that point showed Enterococcus and Enterobacter. An MRI on 3/15 showed no evidence of left hip septic arthritis or osteomyelitis. Extensive  subcutaneous edema within the visualized soft tissue of the left thigh and prominent left inguinal lymph nodes likely reactive. He was started on IV cefepime and vancomycin for 6 weeks. He was discharged to [facility] facility as the patient. There I gather the wound developed the necrotic surface. He was seen by the wound care doctor in house who correctly pointed out that a wound VAC would not do anything with a necrotic surface. Since then he has been using Santyl The patient is now at home. He was on vancomycin and cefepime. He developed hives and a generalized rash and was seen in the ER on 3/15 by Dr. Cain Sieve recommendation he was changed to meropenem however he also apparently developed hives after this. He says the original hives stopped after the cefepime was stopped and follow-up hives only developed after the meropenem. He is only taken 1 dose of the fear of the meropenem and now is on vancomycin. 3/25; the patient was seen by Dr. Linus Salmons on 3/22. Because of intolerances to meropenem which I communicated with Dr. De Burrs about he is now on daptomycin and Cipro until sometime in early April. He seems to be tolerating the antibiotics well. We are using Santyl back by wet-to-dry dressings. And making some improvement  in the general surface of this very large wound 4/8; we are using Santyl back by wet-to-dry. We are making progress in terms of prepping the wound for a wound VAC. Still necrotic material requiring debridement. 4/16; we are using Santyl back by wet-to-dry. Not convinced that his wife is packing this properly there is a 6 cm tunnel at 12:00. We brought this to attention. Still requiring mechanical debridement 4/29; 2-week follow-up. In general the wound is better however there is considerable tunneling tunneling to the greater trochanter itself. There is still some debris in the deeper parts of this wound I did not do mechanical debridement on this today. I am going to change the  dressing to quarter strength Dakin's wet-to-dry. We will put a wound VAC through his insurance. His wife has had brain damage from a previous meningioma surgery. She is not comfortable around strangers in her home. The patient is concerned about this 5/18; patient is using quarter strength Dakin's wet to dry. The problem is a deep pressure area over the left greater trochanter. There was some question from her intake nurses that this is actually come in and some question raised about whether he would require a wound VAC or not. The issue with the wound VAC was complicated predominantly by the patient's reluctance to allow people into his home i.e. home health although he tells me he has this mostly "sorted out" 7/1; this is a patient that I have not seen in almost 6 weeks. Apparently canceled several appointments related to family problems deaths in the family etc. He is wife has ophthalmologic surgery next week. I had been trying to get a wound VAC ordered for this deep probing wound over the left greater trochanter but so far I think he is simply doing a Dakin's wet-to-dry and our intake nurse did not think there was any Dakin's on it. I have a note from Dr. Tommy Medal of infectious disease on 6/30. He has now been off doxycycline and ciprofloxacin for several weeks. He has no undue pain or drainage. He also had discitis and osteomyelitis at L1-L2 with a 1 cm left psoas abscess as well as some increased signal at T10/T11. He is not having any problems related to this. 12/16/2019 on evaluation today patient's wound actually is showing some signs of bit of improvement which is good news. Fortunately there is no signs of active infection at this time which is good news. He tells me that the nurses have been coming to change this 3 times a week and doing a great job. He told the intake nurse that they had only been coming out once a week. I am not really sure what is going on with the discrepancy and stories  here. Nonetheless I do believe that he needs to have this changed 3 times a week on a regular basis he does seem to have a little bit of a fungal infection around the wound. 8/19; 2-week follow-up. I have not seen this patient in over 6 weeks. There is been a dramatic improvement in the overall wound volume. Circumference of the wound is now small although there is still considerable undermining from roughly 9-2 o'clock maximum 1:00 at roughly 4.7 cm. There is no purulent drainage. He has some irritation around the wound on the normal skin. They are using miconazole on this. 9/2; 2-week follow-up. Dimensions of the wound circumference look quite good. There is tunneling at 4.3 cm at 1:00 versus 4.7 last time. The area continues to  look like it is contracting with the wound VAC and I think the treatment of choice here will be continuing to the wound VAC as long as it seems practical. There is no evidence of infection. As far as I am aware he is offloading this aggressively 9/16; 2-week follow-up. Left greater trochanter. Dimensions about the same still about 4.9 cm of maximal undermining. Nevertheless the visible tissue that I can see looks healthy. We have a wound VAC and really from the perspective of how this looks initially this is gotten a lot better however not much change in the last week 9/30 2-week follow-up. Left greater trochanter. He has a tunnel at 12:00 that goes about 7 cm. From 10-12 lesser degrees of undermining. HOWEVER he comes in today telling us that he is going to have to go back to work. He will not be able to work with a wound VAC and he wants that discontinued. There was not much discussion. This is a financial issue for him. He also has erythema around the wound orifice which has satellite lesions around it this is no doubt cutaneous candidiasis 10/12; 2-week follow-up. Left greater trochanter. 7.2 cm of tunneling. 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 He has not been systemically unwell. Objective Constitutional Sitting or standing Blood Pressure is within target range for patient.. Pulse regular and within target range for patient.Marland Kitchen Respirations regular, non-labored and within target range.. Temperature is normal and within the target range for the patient.Marland Kitchen Appears in no distress. Vitals Time Taken: 10:11 AM, Height: 70 in, Weight: 198 lbs, BMI: 28.4, Temperature: 98.3 F, Pulse: 78 bpm, Respiratory Rate: 18 breaths/min, Blood Pressure: 137/76 mmHg. Respiratory work of breathing is normal. General Notes: Wound exam; not much change. Still 7.2 cm of maximal tunneling. Because of the complaints of drainage I did a culture of this although there is no surrounding erythema or crepitus. The erythema that was secondary to Candida last time under the wound VAC drape is a lot better he has been using Lotrimin. Nothing required debridement. Integumentary (Hair, Skin) Wound #1 status is Open. Original cause of wound was Surgical Injury. The wound is located on the Left Trochanter. The wound measures 2cm length x 1.5cm width x 2.6cm depth; 2.356cm^2 area and 6.126cm^3 volume. There is muscle and Fat Layer (Subcutaneous Tissue) exposed. There is tunneling at 11:00 with a maximum distance of 7.6cm. There is additional tunneling and at 12:00 with a maximum distance of 4cm. There is a large amount of serous drainage noted. The wound margin is well defined and not attached to the wound base. There is large (67-100%) pink granulation within the wound bed. There is a small (1-33%) amount of necrotic tissue within the wound bed including Adherent Slough. Assessment Active Problems ICD-10 Pressure ulcer of left hip, stage 4 Disruption of external operation (surgical) wound, not elsewhere classified, subsequent encounter Candidiasis of skin and  nail Plan Follow-up Appointments: Return appointment in 3 weeks. Dressing Change Frequency: Wound #1 Left Trochanter: Change dressing three times week. Skin Barriers/Peri-Wound Care: Wound #1 Left Trochanter: Antifungal cream - lotrimin cream antifungal apply daily around the wound. in clinic to apply ketoconazole. Wound Cleansing: Wound #1 Left Trochanter: May shower and wash wound with soap and water. - on days dressing changed Primary Wound Dressing: Wound #1 Left Trochanter: Calcium Alginate with Silver Secondary Dressing: Wound #  1 Left Trochanter: ABD pad - secure with tape Off-Loading: Other: - stay off of left hip to aid in offloading pressure. Laboratory ordered were: Anerobic culture, left trocanter - non healig wound left trocanter 1. Continue the antifungal cream/Lotrimin this is a lot better 2. I told him that we would try silver alginate packing into this area to help with some of the drainage however we are not going to be able to change this on a daily basis insurance simply will not pay for this. We also spent some time talking about the external dressing. He is using ABDs but finds they fall off we recommended taping this. Again a large border foam would probably result in insurance denials. 3. I have asked him to try to get by on every second day dressing changes 4. I did a culture of the area because of the drainage but no empiric antibiotics Electronic Signature(s) Signed: 02/22/2020 5:41:11 PM By: [name] MD Signed: 02/22/2020 5:41:11 PM By: [name] MD Entered By: [name] on 02/22/2020 11:28:37 -------------------------------------------------------------------------------- SuperBill Details Patient Name: Date of Service: Ivan [name], Ivan BERT W. 02/22/2020 Medical Record Number: [id number] Patient Account Number: [id number] Date of Birth/Sex: Treating RN: [birth year] (73 y.o. Oval Linsey Primary Care Provider: [name] Other  Clinician: Referring Provider: Treating Provider/Extender: Ronnie Derby, [name] in Treatment: 29 Diagnosis Coding ICD-10 Codes Code Description [phone] Pressure ulcer of left hip, stage 4 T81.31XD Disruption of external operation (surgical) wound, not elsewhere classified, subsequent encounter B37.2 Candidiasis of skin and nail Facility Procedures The patient participates with Medicare or their insurance follows the Medicare Facility Guidelines: CPT4 Code Description Modifier Quantity [id number] [place] VISIT-LEV 3 EST PT 1 Physician Procedures : CPT4 Code Description Modifier [id number] - WC PHYS LEVEL 3 - EST PT ICD-10 Diagnosis Description L89.224 Pressure ulcer of left hip, stage 4 T81.31XD Disruption of external operation (surgical) wound, not elsewhere classified, subsequent encounter Quantity: 1 Electronic Signature(s) Signed: 02/22/2020 5:41:11 PM By: [name] MD Entered By: [name] on 02/22/2020 11:30:24

## 2020-02-22 NOTE — Telephone Encounter (Signed)
Will send to front for scheduling. OK to wait until you have availability, or schedule with someone else? Landis Gandy, RN

## 2020-02-23 ENCOUNTER — Telehealth: Payer: Self-pay

## 2020-02-23 NOTE — Telephone Encounter (Signed)
Called patient to get a follow up scheduled with Dr. Tommy Medal, left a voicemail to call us back and get that scheduled

## 2020-02-23 NOTE — Telephone Encounter (Signed)
Whatever he would prefer

## 2020-02-24 ENCOUNTER — Encounter (HOSPITAL_BASED_OUTPATIENT_CLINIC_OR_DEPARTMENT_OTHER): Payer: Medicare HMO | Admitting: Internal Medicine

## 2020-02-26 LAB — AEROBIC CULTURE W GRAM STAIN (SUPERFICIAL SPECIMEN)

## 2020-02-29 ENCOUNTER — Other Ambulatory Visit: Payer: Self-pay

## 2020-02-29 ENCOUNTER — Ambulatory Visit (INDEPENDENT_AMBULATORY_CARE_PROVIDER_SITE_OTHER): Payer: Medicare HMO | Admitting: Infectious Diseases

## 2020-02-29 ENCOUNTER — Encounter: Payer: Self-pay | Admitting: Infectious Diseases

## 2020-02-29 DIAGNOSIS — L89204 Pressure ulcer of unspecified hip, stage 4: Secondary | ICD-10-CM | POA: Insufficient documentation

## 2020-02-29 DIAGNOSIS — Z227 Latent tuberculosis: Secondary | ICD-10-CM | POA: Diagnosis not present

## 2020-02-29 DIAGNOSIS — I10 Essential (primary) hypertension: Secondary | ICD-10-CM

## 2020-02-29 DIAGNOSIS — L89224 Pressure ulcer of left hip, stage 4: Secondary | ICD-10-CM

## 2020-02-29 DIAGNOSIS — M86452 Chronic osteomyelitis with draining sinus, left femur: Secondary | ICD-10-CM

## 2020-02-29 MED ORDER — DOXYCYCLINE HYCLATE 100 MG PO TABS: 100.0000 mg | ORAL_TABLET | Freq: Two times a day (BID) | ORAL | 1 refills | Status: DC

## 2020-02-29 NOTE — Assessment & Plan Note (Signed)
We discussed what his happening-  Will check MRI of his hip.  Will continue doxy for now. He is amenable to being on IV anbx if needed.  Will get him back in with Dr Marla Roe.  Will see him back in 2-3 weeks.

## 2020-02-29 NOTE — Assessment & Plan Note (Signed)
He states he has seen Dr Megan Salon prior and health dept previously and told to defer tx.  He would like to continue to defer today after suggesting that we start today. He wishes to w/u his hip first.

## 2020-02-29 NOTE — Assessment & Plan Note (Signed)
Mildly elevated today.  Appreciate PCP f/u.

## 2020-02-29 NOTE — Progress Notes (Signed)
Subjective:    Patient ID: Ivan [name], male    DOB: [birth year], 74 y.o.   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 Admission notes also indicate that they thought he had new abdominal pain. He was treated for community-acquired pneumonia and a UTI in addition to receiving 4 days of remdesivir and steroids before leaving [place]. He continued to have delirium and severe back pain after discharge and laid on the floor for 4 days straight prior to being readmitted on 05/26/2019. New left hip pressure wound. He was treated empirically for possible sepsis and received 3 days of vancomycin, cefepime and metronidazole. He was discharged on 06/09/2019. On 06/22/2019 he had MRI which showed: 1. Findings concerning for discitis-osteomyelitis at L1-2 with a possible 1 cm left psoas abscess. No evidence of epidural abscess. 2. Less pronounced fluid signal in the T10-11, L2-3, and L5-S1 discs without convincing evidence of osteomyelitis at these levels. 3. Increased, mild spinal stenosis at L1-2. 4. Up to mild spinal and severe neural foraminal stenosis at other levels as above.  Dr Marla Roe did I and D of large decubitus ulcer and left left hip wound and recovered Enterococcus and Enterobacter species he was being treated with vancomycin and cefepime but developed a severe rash changed to vancomycin and meropenem but then still had a rash now changed to daptomycin and ciprofloxacin.  His wound on his hip has not closed.  He has been seeing Dr. Dellia Nims in wound care, his last f/u was 10-12. He had a Cx done at that time for a L hip wound (7 cm tunneling) that grew MSSA.  He prev has wound vac on this wound.  He was just called today by Dr Dellia Nims to start doxy 100mg  bid for 10 days.   He was also seen in urgent care for a CXR (-) after having a quantiferon gold +.   He is back at work.   Review of Systems    Constitutional: Negative for appetite change, chills, fever and unexpected weight change.  Gastrointestinal: Negative for constipation and diarrhea.  Genitourinary: Negative for difficulty urinating.  Skin: Positive for wound.  Please see HPI. All other systems reviewed and negative.      Objective:   Physical Exam Vitals reviewed.  Constitutional:      General: He is not in acute distress.    Appearance: He is not ill-appearing or toxic-appearing.  HENT:     Mouth/Throat:     Mouth: Mucous membranes are moist.     Pharynx: No oropharyngeal exudate.  Eyes:     Extraocular Movements: Extraocular movements intact.     Pupils: Pupils are equal, round, and reactive to light.  Cardiovascular:     Rate and Rhythm: Normal rate and regular rhythm.  Pulmonary:     Effort: Pulmonary effort is normal.     Breath sounds: Normal breath sounds.  Abdominal:     General: Bowel sounds are normal. There is no distension.     Palpations: Abdomen is soft.     Tenderness: There is no abdominal tenderness.  Musculoskeletal:     Cervical back: Normal range of motion and neck supple.  Skin:    General: Skin is warm and dry.     Findings: Erythema present.  Neurological:     General: No focal deficit present.     Mental Status: He is alert.  Psychiatric:  Mood and Affect: Mood normal.      Actively draining in clinic.         Assessment & Plan:

## 2020-03-14 ENCOUNTER — Encounter (HOSPITAL_BASED_OUTPATIENT_CLINIC_OR_DEPARTMENT_OTHER): Payer: Medicare HMO | Attending: Internal Medicine | Admitting: Internal Medicine

## 2020-03-14 ENCOUNTER — Other Ambulatory Visit: Payer: Self-pay

## 2020-03-14 DIAGNOSIS — T8131XD Disruption of external operation (surgical) wound, not elsewhere classified, subsequent encounter: Secondary | ICD-10-CM | POA: Insufficient documentation

## 2020-03-14 DIAGNOSIS — L89224 Pressure ulcer of left hip, stage 4: Secondary | ICD-10-CM | POA: Insufficient documentation

## 2020-03-14 DIAGNOSIS — Z79899 Other long term (current) drug therapy: Secondary | ICD-10-CM | POA: Insufficient documentation

## 2020-03-14 DIAGNOSIS — X58XXXA Exposure to other specified factors, initial encounter: Secondary | ICD-10-CM | POA: Diagnosis not present

## 2020-03-14 DIAGNOSIS — Z881 Allergy status to other antibiotic agents status: Secondary | ICD-10-CM | POA: Insufficient documentation

## 2020-03-14 DIAGNOSIS — Z8616 Personal history of COVID-19: Secondary | ICD-10-CM | POA: Diagnosis not present

## 2020-03-15 NOTE — Progress Notes (Signed)
MOMIN, MISKO ([id number]) Visit Report for 03/14/2020 HPI Details Patient Name: Date of Service: Ivan [name]. 03/14/2020 9:45 A M Medical Record Number: [id number] Patient Account Number: [id number] Date of Birth/Sex: Treating RN: 06-Jun-1945 (73 y.o. 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 He required admission to hospital from 1/5 through 1/9 with Covid pneumonia. At that point he apparently left AMA. He was noted to have a left hip bruise at the time of discharge. I suspect this really was a deep tissue injury. He was readmitted from 1/13 through 1//27 with altered mental status Covid pneumonia and delirium. At that point he had a left hip and thigh wound which had worsened. Felt to be a pressure injury. He was seen by general surgery who recommended normal saline wet-to-dry twice daily he did not have a debridement. After he left at that point he was incapacitated by increasing lumbar pain. An MRI on 2/9 showed discitis and osteomyelitis at L1/2. He asked aspiration of 1 cm fluid however I think this culture negative. He was also noted at this point to have a very large wound over the left greater trochanter. He was taken to the OR by Dr. Marla Roe for debridement with ACell placement. He was placed in a wound VAC. A culture at that point showed Enterococcus and Enterobacter. An MRI on 3/15 showed no evidence of left hip septic arthritis or osteomyelitis. Extensive subcutaneous edema within the visualized soft tissue of the left thigh and prominent left inguinal lymph nodes likely reactive. He was started on IV cefepime and vancomycin for 6 weeks. He was discharged to [facility] as the patient.  There I gather the wound developed the necrotic surface. He was seen by the wound care doctor in house who correctly pointed out that a wound VAC would not do anything with a necrotic surface. Since then he has been using Santyl The patient is now at home. He was on vancomycin and cefepime. He developed hives and a generalized rash and was seen in the ER on 3/15 by Dr. Cain Sieve recommendation he was changed to meropenem however he also apparently developed hives after this. He says the original hives stopped after the cefepime was stopped and follow-up hives only developed after the meropenem. He is only taken 1 dose of the fear of the meropenem and now is on vancomycin. 3/25; the patient was seen by Dr. Linus Salmons on 3/22. Because of intolerances to meropenem which I communicated with Dr. De Burrs about he is now on daptomycin and Cipro until sometime in early April. He seems to be tolerating the antibiotics well. We are using Santyl back by wet-to-dry dressings. And making some improvement in the general surface of this very large wound 4/8; we are using Santyl back by wet-to-dry. We are making progress in terms of prepping the wound for a wound VAC. Still necrotic material requiring debridement. 4/16; we are using Santyl back by wet-to-dry. Not convinced that his wife is packing this properly there is a 6 cm tunnel at 12:00. We brought this to attention. Still requiring mechanical debridement 4/29; 2-week follow-up. In general the wound is better however there is considerable tunneling tunneling to the greater trochanter itself. There is still some debris in the deeper parts of this wound I  did not do mechanical debridement on this today. I am going to change the dressing to quarter strength Dakin's wet-to-dry. We will put a wound VAC through his insurance. His wife has had brain damage from a previous meningioma surgery. She is not comfortable around strangers in her home. The patient is concerned about  this 5/18; patient is using quarter strength Dakin's wet to dry. The problem is a deep pressure area over the left greater trochanter. There was some question from her intake nurses that this is actually come in and some question raised about whether he would require a wound VAC or not. The issue with the wound VAC was complicated predominantly by the patient's reluctance to allow people into his home i.e. home health although he tells me he has this mostly "sorted out" 7/1; this is a patient that I have not seen in almost 6 weeks. Apparently canceled several appointments related to family problems deaths in the family etc. He is wife has ophthalmologic surgery next week. I had been trying to get a wound VAC ordered for this deep probing wound over the left greater trochanter but so far I think he is simply doing a Dakin's wet-to-dry and our intake nurse did not think there was any Dakin's on it. I have a note from Dr. Tommy Medal of infectious disease on 6/30. He has now been off doxycycline and ciprofloxacin for several weeks. He has no undue pain or drainage. He also had discitis and osteomyelitis at L1-L2 with a 1 cm left psoas abscess as well as some increased signal at T10/T11. He is not having any problems related to this. 12/16/2019 on evaluation today patient's wound actually is showing some signs of bit of improvement which is good news. Fortunately there is no signs of active infection at this time which is good news. He tells me that the nurses have been coming to change this 3 times a week and doing a great job. He told the intake nurse that they had only been coming out once a week. I am not really sure what is going on with the discrepancy and stories here. Nonetheless I do believe that he needs to have this changed 3 times a week on a regular basis he does seem to have a little bit of a fungal infection around the wound. 8/19; 2-week follow-up. I have not seen this patient in over 6 weeks.  There is been a dramatic improvement in the overall wound volume. Circumference of the wound is now small although there is still considerable undermining from roughly 9-2 o'clock maximum 1:00 at roughly 4.7 cm. There is no purulent drainage. He has some irritation around the wound on the normal skin. They are using miconazole on this. 9/2; 2-week follow-up. Dimensions of the wound circumference look quite good. There is tunneling at 4.3 cm at 1:00 versus 4.7 last time. The area continues to look like it is contracting with the wound VAC and I think the treatment of choice here will be continuing to the wound VAC as long as it seems practical. There is no evidence of infection. As far as I am aware he is offloading this aggressively 9/16; 2-week follow-up. Left greater trochanter. Dimensions about the same still about 4.9 cm of maximal undermining. Nevertheless the visible tissue that I can see looks healthy. We have a wound VAC and really from the perspective of how this looks initially this is gotten a lot better however not much change in the last week  9/30 2-week follow-up. Left greater trochanter. He has a tunnel at 12:00 that goes about 7 cm. From 10-12 lesser degrees of undermining. HOWEVER he comes in today telling us that he is going to have to go back to work. He will not be able to work with a wound VAC and he wants that discontinued. There was not much discussion. This is a financial issue for him. He also has erythema around the wound orifice which has satellite lesions around it this is no doubt cutaneous candidiasis 10/12; 2-week follow-up. Left greater trochanter. 7.2 cm of tunneling. 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 He has not been systemically unwell. 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 He is using silver alginate which he is  dressing himself, ABDs with a large plastic occlusive dressing. Dimensions are somewhat better although this is an irregular wound. When he was here the last time he had a lot of drainage coming out of this wound. The culture I did showed methicillin sensitive staph aureus. I gave him 10 days of doxycycline and he is just finishing up on this. He states the drainage is a lot better Electronic Signature(s) Signed: 03/14/2020 4:57:00 PM By: [name] MD Entered By: [name] on 03/14/2020 11:13:57 -------------------------------------------------------------------------------- Physical Exam Details Patient Name: Date of Service: Ivan [name], Ivan BERT W. 03/14/2020 9:45 A M Medical Record Number: [id number] Patient Account Number: [id number] Date of Birth/Sex: Treating RN: 25-Jul-1945 (73 y.o. Ivan [name] Primary Care Provider: [name] Other Clinician: Referring Provider: Treating Provider/Extender: Ronnie Derby, [name] in Treatment: 32 Constitutional Sitting or standing Blood Pressure is within target range for patient.. Pulse regular and within target range for patient.Marland Kitchen Respirations regular, non-labored and within target range.. Temperature is normal and within the target range for the patient.Marland Kitchen Appears in no distress. Notes Wound exam; I think this looks better. The wound probes superiorly but there is a deeper tunnel at roughly 12:00 I cannot even accurately measure this. I am not really sure it goes to bone. There is no purulent drainage which is an improvement from last time and he states the drainage is a lot better Electronic Signature(s) Signed: 03/14/2020 4:57:00 PM By: [name] MD Entered By: [name] on 03/14/2020 11:13:11 -------------------------------------------------------------------------------- Physician Orders Details Patient Name: Date of Service: Ivan [name], Ivan BERT W. 03/14/2020 9:45 A M Medical Record Number:  [id number] Patient Account Number: [id number] Date of Birth/Sex: Treating RN: 1945/10/10 (73 y.o. Ivan [name] Primary Care Provider: [name] Other Clinician: Referring Provider: Treating Provider/Extender: Fonnie Jarvis in Treatment: [phone] Verbal / Phone Orders: No Diagnosis Coding ICD-10 Coding Code Description [phone] Pressure ulcer of left hip, stage 4 T81.31XD Disruption of external operation (surgical) wound, not elsewhere classified, subsequent encounter B37.2 Candidiasis of skin and nail Follow-up Appointments Return appointment in 3 weeks. Dressing Change Frequency Wound #1 Left Trochanter Change dressing three times week. Skin Barriers/Peri-Wound Care Wound #1 Left Trochanter ntifungal cream - lotrimin cream antifungal apply daily around the wound. A in clinic to apply ketoconazole. Wound Cleansing Wound #1 Left Trochanter May shower and wash wound with soap and water. - on days dressing changed Primary Wound Dressing Wound #1 Left Trochanter Calcium Alginate with Silver Secondary Dressing Wound #1 Left Trochanter ABD pad - secure with tape Off-Loading Other: - stay  off of left hip to aid in offloading pressure. Electronic Signature(s) Signed: 03/14/2020 4:57:00 PM By: [name] MD Signed: 03/15/2020 9:08:43 AM By: Carlene Coria RN Entered By: Carlene Coria on 03/14/2020 10:07:58 -------------------------------------------------------------------------------- Problem List Details Patient Name: Date of Service: Ivan [name], Ivan BERT W. 03/14/2020 9:45 A M Medical Record Number: [id number] Patient Account Number: [id number] Date of Birth/Sex: Treating RN: 1946/04/12 (73 y.o. Ivan [name] Primary Care Provider: [name] Other Clinician: Referring Provider: Treating Provider/Extender: Fonnie Jarvis in Treatment: 32 Active Problems ICD-10 Encounter Code Description Active Date MDM Diagnosis L89.224  Pressure ulcer of left hip, stage 4 08/05/2019 No Yes T81.31XD Disruption of external operation (surgical) wound, not elsewhere classified, 07/29/2019 No Yes subsequent encounter B37.2 Candidiasis of skin and nail 02/10/2020 No Yes L03.116 Cellulitis of left lower limb 03/14/2020 No Yes B95.61 Methicillin susceptible Staphylococcus aureus infection as the cause of 03/14/2020 No Yes diseases classified elsewhere Inactive Problems Resolved Problems Electronic Signature(s) Signed: 03/14/2020 4:57:00 PM By: [name] MD Entered By: [name] on 03/14/2020 11:12:44 -------------------------------------------------------------------------------- Progress Note Details Patient Name: Date of Service: Ivan [name], Ivan BERT W. 03/14/2020 9:45 A M Medical Record Number: [id number] Patient Account Number: [id number] Date of Birth/Sex: Treating RN: December 29, 1945 (73 y.o. 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 He required admission to hospital from 1/5 through 1/9 with Covid pneumonia. At that point he apparently left AMA. He was noted to have a left hip bruise at the time of discharge. I suspect this really was a deep tissue injury. He was readmitted from 1/13 through 1//27 with altered mental status Covid pneumonia and delirium. At that point he had a left hip and thigh wound which had worsened. Felt to be a pressure injury. He was seen by general surgery who recommended normal saline wet-to-dry twice daily he did not have a debridement. After he left at that point he was incapacitated by increasing lumbar pain. An MRI on 2/9 showed discitis and osteomyelitis at L1/2. He asked aspiration of 1 cm fluid however I think this  culture negative. He was also noted at this point to have a very large wound over the left greater trochanter. He was taken to the OR by Dr. Marla Roe for debridement with ACell placement. He was placed in a wound VAC. A culture at that point showed Enterococcus and Enterobacter. An MRI on 3/15 showed no evidence of left hip septic arthritis or osteomyelitis. Extensive subcutaneous edema within the visualized soft tissue of the left thigh and prominent left inguinal lymph nodes likely reactive. He was started on IV cefepime and vancomycin for 6 weeks. He was discharged to [facility] [facility] facility as the patient. There I gather the wound developed the necrotic surface. He was seen by the wound care doctor in house who correctly pointed out that a wound VAC would not do anything with a necrotic surface. Since then he has been using Santyl The patient is now at home. He was on vancomycin and cefepime. He developed hives and a generalized rash and was seen in the ER on 3/15 by Dr. Cain Sieve recommendation he was changed to meropenem however he also apparently developed hives after this. He says the original hives stopped after the cefepime was stopped and follow-up hives only developed after  the meropenem. He is only taken 1 dose of the fear of the meropenem and now is on vancomycin. 3/25; the patient was seen by Dr. Linus Salmons on 3/22. Because of intolerances to meropenem which I communicated with Dr. De Burrs about he is now on daptomycin and Cipro until sometime in early April. He seems to be tolerating the antibiotics well. We are using Santyl back by wet-to-dry dressings. And making some improvement in the general surface of this very large wound 4/8; we are using Santyl back by wet-to-dry. We are making progress in terms of prepping the wound for a wound VAC. Still necrotic material requiring debridement. 4/16; we are using Santyl back by wet-to-dry. Not convinced that his wife is packing this properly there is  a 6 cm tunnel at 12:00. We brought this to attention. Still requiring mechanical debridement 4/29; 2-week follow-up. In general the wound is better however there is considerable tunneling tunneling to the greater trochanter itself. There is still some debris in the deeper parts of this wound I did not do mechanical debridement on this today. I am going to change the dressing to quarter strength Dakin's wet-to-dry. We will put a wound VAC through his insurance. His wife has had brain damage from a previous meningioma surgery. She is not comfortable around strangers in her home. The patient is concerned about this 5/18; patient is using quarter strength Dakin's wet to dry. The problem is a deep pressure area over the left greater trochanter. There was some question from her intake nurses that this is actually come in and some question raised about whether he would require a wound VAC or not. The issue with the wound VAC was complicated predominantly by the patient's reluctance to allow people into his home i.e. home health although he tells me he has this mostly "sorted out" 7/1; this is a patient that I have not seen in almost 6 weeks. Apparently canceled several appointments related to family problems deaths in the family etc. He is wife has ophthalmologic surgery next week. I had been trying to get a wound VAC ordered for this deep probing wound over the left greater trochanter but so far I think he is simply doing a Dakin's wet-to-dry and our intake nurse did not think there was any Dakin's on it. I have a note from Dr. Tommy Medal of infectious disease on 6/30. He has now been off doxycycline and ciprofloxacin for several weeks. He has no undue pain or drainage. He also had discitis and osteomyelitis at L1-L2 with a 1 cm left psoas abscess as well as some increased signal at T10/T11. He is not having any problems related to this. 12/16/2019 on evaluation today patient's wound actually is showing some  signs of bit of improvement which is good news. Fortunately there is no signs of active infection at this time which is good news. He tells me that the nurses have been coming to change this 3 times a week and doing a great job. He told the intake nurse that they had only been coming out once a week. I am not really sure what is going on with the discrepancy and stories here. Nonetheless I do believe that he needs to have this changed 3 times a week on a regular basis he does seem to have a little bit of a fungal infection around the wound. 8/19; 2-week follow-up. I have not seen this patient in over 6 weeks. There is been a dramatic improvement in the overall wound  volume. Circumference of the wound is now small although there is still considerable undermining from roughly 9-2 o'clock maximum 1:00 at roughly 4.7 cm. There is no purulent drainage. He has some irritation around the wound on the normal skin. They are using miconazole on this. 9/2; 2-week follow-up. Dimensions of the wound circumference look quite good. There is tunneling at 4.3 cm at 1:00 versus 4.7 last time. The area continues to look like it is contracting with the wound VAC and I think the treatment of choice here will be continuing to the wound VAC as long as it seems practical. There is no evidence of infection. As far as I am aware he is offloading this aggressively 9/16; 2-week follow-up. Left greater trochanter. Dimensions about the same still about 4.9 cm of maximal undermining. Nevertheless the visible tissue that I can see looks healthy. We have a wound VAC and really from the perspective of how this looks initially this is gotten a lot better however not much change in the last week 9/30 2-week follow-up. Left greater trochanter. He has a tunnel at 12:00 that goes about 7 cm. From 10-12 lesser degrees of undermining. HOWEVER he comes in today telling us that he is going to have to go back to work. He will not be able to work  with a wound VAC and he wants that discontinued. There was not much discussion. This is a financial issue for him. He also has erythema around the wound orifice which has satellite lesions around it this is no doubt cutaneous candidiasis 10/12; 2-week follow-up. Left greater trochanter. 7.2 cm of tunneling. 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 He has not been systemically unwell. 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 He is using silver alginate which he is dressing himself, ABDs with a large plastic occlusive dressing. Dimensions are somewhat better although this is an irregular wound. When he was here the last time he had a lot of drainage coming out of this wound. The culture I did showed methicillin sensitive staph aureus. I gave him 10 days of doxycycline and he is just finishing up on this. He states the drainage is a lot better Objective Constitutional Sitting or standing Blood Pressure is within target range for patient.. Pulse regular and within target range for patient.Marland Kitchen Respirations regular, non-labored and within target range.. Temperature is normal and within the target range for the patient.Marland Kitchen Appears in no distress. Vitals Time Taken: 10:10 AM, Height: 70 in, Weight: 198 lbs, BMI: 28.4, Temperature: 98.1 F, Pulse: 76 bpm, Respiratory Rate: 18 breaths/min, Blood Pressure: 145/80 mmHg. General Notes: Wound exam; I think this looks better. The wound probes superiorly but there is a deeper tunnel at roughly 12:00 I cannot even accurately measure this. I am not really sure it goes to bone. There is no purulent drainage which is an improvement from last time and he states the drainage is a lot better Integumentary (Hair, Skin) Wound #1 status is Open. Original cause of wound was Surgical Injury. The wound is located on the Left  Trochanter. The wound measures 1.9cm length x 1.7cm width x 2.4cm depth; 2.537cm^2 area and 6.088cm^3 volume. There is muscle and Fat Layer (Subcutaneous Tissue) exposed. Tunneling has been noted at 11:00 with a maximum distance of 3.8cm. Undermining begins at 9:00 and ends at 12:00  with a maximum distance of 2cm. There is a large amount of purulent drainage noted. The wound margin is well defined and not attached to the wound base. There is large (67-100%) pink granulation within the wound bed. There is no necrotic tissue within the wound bed. Assessment Active Problems ICD-10 Pressure ulcer of left hip, stage 4 Disruption of external operation (surgical) wound, not elsewhere classified, subsequent encounter Candidiasis of skin and nail Cellulitis of left lower limb Methicillin susceptible Staphylococcus aureus infection as the cause of diseases classified elsewhere Plan Follow-up Appointments: Return appointment in 3 weeks. Dressing Change Frequency: Wound #1 Left Trochanter: Change dressing three times week. Skin Barriers/Peri-Wound Care: Wound #1 Left Trochanter: Antifungal cream - lotrimin cream antifungal apply daily around the wound. in clinic to apply ketoconazole. Wound Cleansing: Wound #1 Left Trochanter: May shower and wash wound with soap and water. - on days dressing changed Primary Wound Dressing: Wound #1 Left Trochanter: Calcium Alginate with Silver Secondary Dressing: Wound #1 Left Trochanter: ABD pad - secure with tape Off-Loading: Other: - stay off of left hip to aid in offloading pressure. 1. I continued with the silver alginate. 2. This would not be an easy wound for a single patient to do themselves although he said he is able to do this and seemed to be aware of what I was describing. Just internally when you look in this wound it looks healthy however there is then a Y with 1 area appearing to go most superficially and then a deeper area going down to the  trochanter itself. I did not actually feel this but is precariously close 3. Much less drainage no purulence. He is completing his doxycycline and that should be it for antibiotics for the moment Electronic Signature(s) Signed: 03/14/2020 4:57:00 PM By: [name] MD Entered By: [name] on 03/14/2020 11:15:06 -------------------------------------------------------------------------------- SuperBill Details Patient Name: Date of Service: Ivan [name], Ivan BERT W. 03/14/2020 Medical Record Number: [id number] Patient Account Number: [id number] Date of Birth/Sex: Treating RN: Jan 25, 1946 (73 y.o. Ivan [name] Primary Care Provider: [name] Other Clinician: Referring Provider: Treating Provider/Extender: Ronnie Derby, [name] in Treatment: 32 Diagnosis Coding ICD-10 Codes Code Description [phone] Pressure ulcer of left hip, stage 4 T81.31XD Disruption of external operation (surgical) wound, not elsewhere classified, subsequent encounter B37.2 Candidiasis of skin and nail Facility Procedures The patient participates with Medicare or their insurance follows the Medicare Facility Guidelines: CPT4 Code Description Modifier Quantity [id number] [place] VISIT-LEV 3 EST PT 1 Physician Procedures : CPT4 Code Description Modifier [id number] - WC PHYS LEVEL 3 - EST PT ICD-10 Diagnosis Description L89.224 Pressure ulcer of left hip, stage 4 T81.31XD Disruption of external operation (surgical) wound, not elsewhere classified, subsequent encounter Quantity: 1 Electronic Signature(s) Signed: 03/14/2020 4:57:00 PM By: [name] MD Entered By: [name] on 03/14/2020 11:15:35

## 2020-03-16 NOTE — Progress Notes (Signed)
Ivan, MATSUO ([id number]) Visit Report for 03/14/2020 Arrival Information Details Patient Name: Date of Service: Ivan [name]. 03/14/2020 9:45 A M Medical Record Number: [id number] Patient Account Number: [id number] Date of Birth/Sex: Treating RN: August 17, 1945 (74 y.o. Ivan [name], Meta.Reding Primary Care Bailey Kolbe: [name] Other Clinician: Referring Wasyl Dornfeld: Treating Myrtha Tonkovich/Extender: Fonnie Jarvis in Treatment: 65 Visit Information History Since Last Visit Added or deleted any medications: No Patient Arrived: Ambulatory Any new allergies or adverse reactions: No Arrival Time: 10:10 Had a fall or experienced change in No Accompanied By: self activities of daily living that may affect Transfer Assistance: None risk of falls: Patient Identification Verified: Yes Signs or symptoms of abuse/neglect since last visito No Secondary Verification Process Completed: Yes Hospitalized since last visit: No Patient Requires Transmission-Based Precautions: No Implantable device outside of the clinic excluding No Patient Has Alerts: No cellular tissue based products placed in the center since last visit: Has Dressing in Place as Prescribed: Yes Pain Present Now: No Electronic Signature(s) Signed: 03/14/2020 4:26:11 PM By: [name] Entered By: [name] on 03/14/2020 10:13:00 -------------------------------------------------------------------------------- Clinic Level of Care Assessment Details Patient Name: Date of Service: Ivan [name]. 03/14/2020 9:45 A M Medical Record Number: [id number] Patient Account Number: [id number] Date of Birth/Sex: Treating RN: Aug 22, 1945 (74 y.o. Ivan [name]) Carlene Coria Primary Care Everley Evora: [name] Other Clinician: Referring Destenie Ingber: Treating Clara Herbison/Extender: Fonnie Jarvis in Treatment: 32 Clinic Level of Care Assessment Items TOOL 4 Quantity Score X- 1 0 Use when only an EandM is  performed on FOLLOW-UP visit ASSESSMENTS - Nursing Assessment / Reassessment X- 1 10 Reassessment of Co-morbidities (includes updates in patient status) X- 1 5 Reassessment of Adherence to Treatment Plan ASSESSMENTS - Wound and Skin A ssessment / Reassessment X - Simple Wound Assessment / Reassessment - one wound 1 5 []  - 0 Complex Wound Assessment / Reassessment - multiple wounds []  - 0 Dermatologic / Skin Assessment (not related to wound area) ASSESSMENTS - Focused Assessment []  - 0 Circumferential Edema Measurements - multi extremities []  - 0 Nutritional Assessment / Counseling / Intervention []  - 0 Lower Extremity Assessment (monofilament, tuning fork, pulses) []  - 0 Peripheral Arterial Disease Assessment (using hand held doppler) ASSESSMENTS - Ostomy and/or Continence Assessment and Care []  - 0 Incontinence Assessment and Management []  - 0 Ostomy Care Assessment and Management (repouching, etc.) PROCESS - Coordination of Care X - Simple Patient / Family Education for ongoing care 1 15 []  - 0 Complex (extensive) Patient / Family Education for ongoing care X- 1 10 Staff obtains Consents, Records, T Results / Process Orders est []  - 0 Staff telephones HHA, Nursing Homes / Clarify orders / etc []  - 0 Routine Transfer to another Facility (non-emergent condition) []  - 0 Routine Hospital Admission (non-emergent condition) []  - 0 New Admissions / Biomedical engineer / Ordering NPWT Apligraf, etc. , []  - 0 Emergency Hospital Admission (emergent condition) X- 1 10 Simple Discharge Coordination []  - 0 Complex (extensive) Discharge Coordination PROCESS - Special Needs []  - 0 Pediatric / Minor Patient Management []  - 0 Isolation Patient Management []  - 0 Hearing / Language / Visual special needs []  - 0 Assessment of Community assistance (transportation, D/C planning, etc.) []  - 0 Additional assistance / Altered mentation []  - 0 Support Surface(s) Assessment  (bed, cushion, seat, etc.) INTERVENTIONS - Wound Cleansing / Measurement X - Simple Wound Cleansing - one wound 1 5 []  - 0 Complex Wound Cleansing -  multiple wounds X- 1 5 Wound Imaging (photographs - any number of wounds) []  - 0 Wound Tracing (instead of photographs) X- 1 5 Simple Wound Measurement - one wound []  - 0 Complex Wound Measurement - multiple wounds INTERVENTIONS - Wound Dressings []  - 0 Small Wound Dressing one or multiple wounds X- 1 15 Medium Wound Dressing one or multiple wounds []  - 0 Large Wound Dressing one or multiple wounds X- 1 5 Application of Medications - topical []  - 0 Application of Medications - injection INTERVENTIONS - Miscellaneous []  - 0 External ear exam []  - 0 Specimen Collection (cultures, biopsies, blood, body fluids, etc.) []  - 0 Specimen(s) / Culture(s) sent or taken to Lab for analysis []  - 0 Patient Transfer (multiple staff / Civil Service fast streamer / Similar devices) []  - 0 Simple Staple / Suture removal (25 or less) []  - 0 Complex Staple / Suture removal (26 or more) []  - 0 Hypo / Hyperglycemic Management (close monitor of Blood Glucose) []  - 0 Ankle / Brachial Index (ABI) - do not check if billed separately X- 1 5 Vital Signs Has the patient been seen at the hospital within the last three years: Yes Total Score: 95 Level Of Care: New/Established - Level 3 Electronic Signature(s) Signed: 03/15/2020 9:08:43 AM By: Carlene Coria RN Entered By: Carlene Coria on 03/14/2020 11:12:10 -------------------------------------------------------------------------------- Encounter Discharge Information Details Patient Name: Date of Service: Ivan [name], [name] 03/14/2020 9:45 A M Medical Record Number: [id number] Patient Account Number: [id number] Date of Birth/Sex: Treating RN: February 14, 1946 (74 y.o. [name] Primary Care Janelly Switalski: [name] Other Clinician: Referring Rogan Wigley: Treating Hunter Bachar/Extender: Fonnie Jarvis in Treatment: 32 Encounter Discharge Information Items Discharge Condition: Stable Ambulatory Status: Ambulatory Discharge Destination: Home Transportation: Private Auto Accompanied By: alone Schedule Follow-up Appointment: Yes Clinical Summary of Care: Patient Declined Electronic Signature(s) Signed: 03/16/2020 5:54:11 PM By: Levan Hurst RN, BSN Entered By: Levan Hurst on 03/14/2020 12:20:55 -------------------------------------------------------------------------------- Lower Extremity Assessment Details Patient Name: Date of Service: Ivan [name], [name] 03/14/2020 9:45 A M Medical Record Number: [id number] Patient Account Number: [id number] Date of Birth/Sex: Treating RN: 01-16-1946 (74 y.o. Ivan [name], Meta.Reding Primary Care Linetta Regner: [name] Other Clinician: Referring Makela Niehoff: Treating Trevino Wyatt/Extender: Ronnie Derby, [name] in Treatment: 32 Electronic Signature(s) Signed: 03/14/2020 4:26:11 PM By: [name] Entered By: [name] on 03/14/2020 10:15:27 -------------------------------------------------------------------------------- Multi Wound Chart Details Patient Name: Date of Service: Ivan [name], [name] 03/14/2020 9:45 A M Medical Record Number: [id number] Patient Account Number: [id number] Date of Birth/Sex: Treating RN: 1946-03-28 (73 y.o. Ivan [name]) Carlene Coria Primary Care Xariah Silvernail: [name] Other Clinician: Referring Dario Yono: Treating Naija Troost/Extender: Ronnie Derby, [name] in Treatment: 32 Vital Signs Height(in): 70 Pulse(bpm): 64 Weight(lbs): 198 Blood Pressure(mmHg): 145/80 Body Mass Index(BMI): 28 Temperature(F): 98.1 Respiratory Rate(breaths/min): 18 Photos: [1:No Photos Left Trochanter] [N/A:N/A N/A] Wound Location: [1:Surgical Injury] [N/A:N/A] Wounding Event: [1:Pressure Ulcer] [N/A:N/A] Primary Etiology: [1:Open Surgical Wound] [N/A:N/A] Secondary Etiology: [1:Cataracts, Coronary Artery  Disease, N/A] Comorbid History: [1:Hypertension, Myocardial Infarction, Osteoarthritis, Osteomyelitis 07/01/2019] [N/A:N/A] Date Acquired: [1:32] [N/A:N/A] Weeks of Treatment: [1:Open] [N/A:N/A] Wound Status: [1:1.9x1.7x2.4] [N/A:N/A] Measurements L x W x D (cm) [1:2.537] [N/A:N/A] A (cm) : rea [1:6.088] [N/A:N/A] Volume (cm) : [1:88.20%] [N/A:N/A] % Reduction in A rea: [1:79.80%] [N/A:N/A] % Reduction in Volume: [1:11] Position 1 (o'clock): [1:3.8] Maximum Distance 1 (cm): [1:9] Starting Position 1 (o'clock): [1:12] Ending Position 1 (o'clock): [1:2] Maximum Distance 1 (cm): [1:Yes] [N/A:N/A] Tunneling: [1:Yes] [N/A:N/A] Undermining: [1:Category/Stage  IV] [N/A:N/A] Classification: [1:Large] [N/A:N/A] Exudate A mount: [1:Purulent] [N/A:N/A] Exudate Type: [1:yellow, brown, green] [N/A:N/A] Exudate Color: [1:Well defined, not attached] [N/A:N/A] Wound Margin: [1:Large (67-100%)] [N/A:N/A] Granulation A mount: [1:Pink] [N/A:N/A] Granulation Quality: [1:None Present (0%)] [N/A:N/A] Necrotic A mount: [1:Fat Layer (Subcutaneous Tissue): Yes N/A] Exposed Structures: [1:Muscle: Yes Fascia: No Tendon: No Joint: No Bone: No None] [N/A:N/A] Treatment Notes Electronic Signature(s) Signed: 03/14/2020 4:57:00 PM By: [name] MD Signed: 03/15/2020 9:08:43 AM By: Carlene Coria RN Entered By: [name] on 03/14/2020 11:12:50 -------------------------------------------------------------------------------- Multi-Disciplinary Care Plan Details Patient Name: Date of Service: Ivan [name], [name] 03/14/2020 9:45 A M Medical Record Number: [id number] Patient Account Number: [id number] Date of Birth/Sex: Treating RN: May 13, 1946 (73 y.o. Oval Linsey Primary Care Brendin Situ: [name] Other Clinician: Referring Disa Riedlinger: Treating Burnis Halling/Extender: Ronnie Derby, [name] in Treatment: 32 Active Inactive Wound/Skin Impairment Nursing Diagnoses: Knowledge deficit  related to ulceration/compromised skin integrity Goals: Patient/caregiver will verbalize understanding of skin care regimen Date Initiated: 07/29/2019 Target Resolution Date: 04/10/2020 Goal Status: Active Interventions: Assess patient/caregiver ability to obtain necessary supplies Assess patient/caregiver ability to perform ulcer/skin care regimen upon admission and as needed Provide education on ulcer and skin care Treatment Activities: Skin care regimen initiated : 07/29/2019 Topical wound management initiated : 07/29/2019 Notes: Electronic Signature(s) Signed: 03/15/2020 9:08:43 AM By: Carlene Coria RN Entered By: Carlene Coria on 03/14/2020 10:08:11 -------------------------------------------------------------------------------- Pain Assessment Details Patient Name: Date of Service: Ivan [name], [name] 03/14/2020 9:45 A M Medical Record Number: [id number] Patient Account Number: [id number] Date of Birth/Sex: Treating RN: 07-23-1945 (74 y.o. [name] Primary Care Gradie Ohm: [name] Other Clinician: Referring Meshelle Holness: Treating Syvilla Martin/Extender: Fonnie Jarvis in Treatment: 32 Active Problems Location of Pain Severity and Description of Pain Patient Has Paino No Site Locations Rate the pain. Current Pain Level: 0 Pain Management and Medication Current Pain Management: Medication: No Cold Application: No Rest: No Massage: No Activity: No T.E.N.S.: No Heat Application: No Leg drop or elevation: No Is the Current Pain Management Adequate: Adequate How does your wound impact your activities of daily livingo Sleep: No Bathing: No Appetite: No Relationship With Others: No Bladder Continence: No Emotions: No Bowel Continence: No Work: No Toileting: No Drive: No Dressing: No Hobbies: No Electronic Signature(s) Signed: 03/14/2020 4:26:11 PM By: [name] Entered By: [name] on 03/14/2020  10:15:22 -------------------------------------------------------------------------------- Patient/Caregiver Education Details Patient Name: Date of Service: Ivan [name], [name] 11/2/2021andnbsp9:45 A M Medical Record Number: [id number] Patient Account Number: [id number] Date of Birth/Gender: Treating RN: 08/08/1945 (73 y.o. Oval Linsey Primary Care Physician: [name] Other Clinician: Referring Physician: Treating Physician/Extender: Fonnie Jarvis in Treatment: 32 Education Assessment Education Provided To: Patient Education Topics Provided Wound/Skin Impairment: Methods: Explain/Verbal Responses: State content correctly Electronic Signature(s) Signed: 03/15/2020 9:08:43 AM By: Carlene Coria RN Entered By: Carlene Coria on 03/14/2020 10:08:27 -------------------------------------------------------------------------------- Wound Assessment Details Patient Name: Date of Service: Ivan [name], [name] 03/14/2020 9:45 A M Medical Record Number: [id number] Patient Account Number: [id number] Date of Birth/Sex: Treating RN: 1945/08/09 (74 y.o. [name] Primary Care Gwendola Hornaday: [name] Other Clinician: Referring Shaquera Ansley: Treating Lynnie Koehler/Extender: Ronnie Derby, [name] in Treatment: 32 Wound Status Wound Number: 1 Primary Pressure Ulcer Etiology: Wound Location: Left Trochanter Secondary Open Surgical Wound Wounding Event: Surgical Injury Etiology: Date Acquired: 07/01/2019 Wound Open Weeks Of Treatment: 32 Status: Clustered Wound: No Comorbid Cataracts, Coronary Artery Disease, Hypertension, Myocardial History: Infarction, Osteoarthritis, Osteomyelitis Wound  Measurements Length: (cm) 1.9 Width: (cm) 1.7 Depth: (cm) 2.4 Area: (cm) 2.537 Volume: (cm) 6.088 % Reduction in Area: 88.2% % Reduction in Volume: 79.8% Epithelialization: None Tunneling: Yes Position (o'clock): 11 Maximum Distance: (cm)  3.8 Undermining: Yes Starting Position (o'clock): 9 Ending Position (o'clock): 12 Maximum Distance: (cm) 2 Wound Description Classification: Category/Stage IV Wound Margin: Well defined, not attached Exudate Amount: Large Exudate Type: Purulent Exudate Color: yellow, brown, green Foul Odor After Cleansing: No Slough/Fibrino No Wound Bed Granulation Amount: Large (67-100%) Exposed Structure Granulation Quality: Pink Fascia Exposed: No Necrotic Amount: None Present (0%) Fat Layer (Subcutaneous Tissue) Exposed: Yes Tendon Exposed: No Muscle Exposed: Yes Necrosis of Muscle: No Joint Exposed: No Bone Exposed: No Treatment Notes Wound #1 (Left Trochanter) 1. Cleanse With Wound Cleanser 2. Periwound Care Antifungal cream 3. Primary Dressing Applied Calcium Alginate Ag 4. Secondary Dressing ABD Pad 5. Secured With Recruitment consultant) Signed: 03/14/2020 4:26:11 PM By: [name] Entered By: [name] on 03/14/2020 10:16:04 -------------------------------------------------------------------------------- Vitals Details Patient Name: Date of Service: Ivan [name], [name] 03/14/2020 9:45 A M Medical Record Number: [id number] Patient Account Number: [id number] Date of Birth/Sex: Treating RN: [birth year] (74 y.o. [name] Primary Care Keyatta Tolles: [name] Other Clinician: Referring Arvella Massingale: Treating Aloni Chuang/Extender: Ronnie Derby, [name] in Treatment: 32 Vital Signs Time Taken: 10:10 Temperature (F): 98.1 Height (in): 70 Pulse (bpm): 76 Weight (lbs): 198 Respiratory Rate (breaths/min): 18 Body Mass Index (BMI): 28.4 Blood Pressure (mmHg): 145/80 Reference Range: 80 - 120 mg / dl Electronic Signature(s) Signed: 03/14/2020 4:26:11 PM By: [name] Entered By: [name] on 03/14/2020 10:15:13

## 2020-03-20 ENCOUNTER — Other Ambulatory Visit: Payer: Self-pay | Admitting: Infectious Diseases

## 2020-03-20 DIAGNOSIS — L89224 Pressure ulcer of left hip, stage 4: Secondary | ICD-10-CM

## 2020-03-23 ENCOUNTER — Ambulatory Visit: Payer: Medicare HMO | Admitting: Infectious Diseases

## 2020-03-23 ENCOUNTER — Telehealth: Payer: Self-pay | Admitting: Family Medicine

## 2020-03-23 NOTE — Telephone Encounter (Signed)
Patient is calling and would like to discuss his wound culture with Dr. Owens Shark. He has an appointment made at [facility] but it is scheduled further out than he would like. He was told to check with pcp.   Please call to discuss. The best call back number is [phone]

## 2020-03-24 NOTE — Telephone Encounter (Signed)
Attempted to call patient about wound/culture.  Left generic voicemail to call back.  If patient calls back please ask him what times works best this PM to discuss.   [name], MD  Family Medicine Teaching Service

## 2020-04-03 ENCOUNTER — Other Ambulatory Visit: Payer: Self-pay

## 2020-04-03 ENCOUNTER — Telehealth: Payer: Medicare HMO | Admitting: Physician Assistant

## 2020-04-03 ENCOUNTER — Encounter (HOSPITAL_BASED_OUTPATIENT_CLINIC_OR_DEPARTMENT_OTHER): Payer: Medicare HMO | Admitting: Physician Assistant

## 2020-04-03 DIAGNOSIS — T8131XD Disruption of external operation (surgical) wound, not elsewhere classified, subsequent encounter: Secondary | ICD-10-CM | POA: Diagnosis not present

## 2020-04-03 NOTE — Progress Notes (Addendum)
[name], [name] ([id number]) Visit Report for 04/03/2020 Chief Complaint Document Details Patient Name: Date of Service: Ivan [name]. 04/03/2020 9:00 A M Medical Record Number: [id number] Patient Account Number: [id number] Date of Birth/Sex: Treating RN: [birth year] (74 y.o. [name] Primary Care Provider: [name] Other Clinician: Referring Provider: Treating Provider/Extender: Octaviano Glow, Carina Weeks in Treatment: 35 Information Obtained from: Patient Chief Complaint 07/29/2019; patient is here for review of a fairly substantial wound over the left greater trochanter Electronic Signature(s) Signed: 04/03/2020 9:18:02 AM By: Worthy Keeler PA-C Entered By: Worthy Keeler on 04/03/2020 09:18:02 -------------------------------------------------------------------------------- HPI Details Patient Name: Date of Service: Ivan [name], [name] 04/03/2020 9:00 A M Medical Record Number: [id number] Patient Account Number: [id number] Date of Birth/Sex: Treating RN: 1945/06/24 (74 y.o. 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 He required admission to hospital from 1/5 through 1/9 with Covid pneumonia. At that point he apparently left AMA. He was noted to have a left hip bruise at the time of discharge. I suspect this really was a deep tissue injury. He was readmitted from 1/13 through 1//27 with altered mental status Covid pneumonia and delirium. At that point he had a left hip and thigh wound which had worsened. Felt to be a pressure injury. He was seen by general surgery who recommended normal saline wet-to-dry twice daily he did not have a debridement. After he left at that point  he was incapacitated by increasing lumbar pain. An MRI on 2/9 showed discitis and osteomyelitis at L1/2. He asked aspiration of 1 cm fluid however I think this culture negative. He was also noted at this point to have a very large wound over the left greater trochanter. He was taken to the OR by Dr. Marla Roe for debridement with ACell placement. He was placed in a wound VAC. A culture at that point showed Enterococcus and Enterobacter. An MRI on 3/15 showed no evidence of left hip septic arthritis or osteomyelitis. Extensive subcutaneous edema within the visualized soft tissue of the left thigh and prominent left inguinal lymph nodes likely reactive. He was started on IV cefepime and vancomycin for 6 weeks. He was discharged to [name] facility as the patient. There I gather the wound developed the necrotic surface. He was seen by the wound care doctor in house who correctly pointed out that a wound VAC would not do anything with a necrotic surface. Since then he has been using Santyl The patient is now at home. He was on vancomycin and cefepime. He developed hives and a generalized rash and was seen in the ER on 3/15 by Dr. Cain Sieve recommendation he was changed to meropenem however he also apparently developed hives after this. He says the original hives stopped after the cefepime was stopped and follow-up hives only developed after the meropenem. He is only taken 1 dose of the fear of the meropenem and now is on vancomycin. 3/25; the patient was seen by Dr. Linus Salmons on 3/22. Because of intolerances to meropenem which I communicated with Dr. De Burrs about he is now on daptomycin and Cipro until sometime in early April. He seems to be tolerating the antibiotics well. We are using Santyl back by wet-to-dry dressings. And making some improvement in the general  surface of this very large wound 4/8; we are using Santyl back by wet-to-dry. We are making progress in terms of prepping the wound for a wound  VAC. Still necrotic material requiring debridement. 4/16; we are using Santyl back by wet-to-dry. Not convinced that his wife is packing this properly there is a 6 cm tunnel at 12:00. We brought this to attention. Still requiring mechanical debridement 4/29; 2-week follow-up. In general the wound is better however there is considerable tunneling tunneling to the greater trochanter itself. There is still some debris in the deeper parts of this wound I did not do mechanical debridement on this today. I am going to change the dressing to quarter strength Dakin's wet-to-dry. We will put a wound VAC through his insurance. His wife has had brain damage from a previous meningioma surgery. She is not comfortable around strangers in her home. The patient is concerned about this 5/18; patient is using quarter strength Dakin's wet to dry. The problem is a deep pressure area over the left greater trochanter. There was some question from her intake nurses that this is actually come in and some question raised about whether he would require a wound VAC or not. The issue with the wound VAC was complicated predominantly by the patient's reluctance to allow people into his home i.e. home health although he tells me he has this mostly "sorted out" 7/1; this is a patient that I have not seen in almost 6 weeks. Apparently canceled several appointments related to family problems deaths in the family etc. He is wife has ophthalmologic surgery next week. I had been trying to get a wound VAC ordered for this deep probing wound over the left greater trochanter but so far I think he is simply doing a Dakin's wet-to-dry and our intake nurse did not think there was any Dakin's on it. I have a note from Dr. Tommy Medal of infectious disease on 6/30. He has now been off doxycycline and ciprofloxacin for several weeks. He has no undue pain or drainage. He also had discitis and osteomyelitis at L1-L2 with a 1 cm left psoas abscess as  well as some increased signal at T10/T11. He is not having any problems related to this. 12/16/2019 on evaluation today patient's wound actually is showing some signs of bit of improvement which is good news. Fortunately there is no signs of active infection at this time which is good news. He tells me that the nurses have been coming to change this 3 times a week and doing a great job. He told the intake nurse that they had only been coming out once a week. I am not really sure what is going on with the discrepancy and stories here. Nonetheless I do believe that he needs to have this changed 3 times a week on a regular basis he does seem to have a little bit of a fungal infection around the wound. 8/19; 2-week follow-up. I have not seen this patient in over 6 weeks. There is been a dramatic improvement in the overall wound volume. Circumference of the wound is now small although there is still considerable undermining from roughly 9-2 o'clock maximum 1:00 at roughly 4.7 cm. There is no purulent drainage. He has some irritation around the wound on the normal skin. They are using miconazole on this. 9/2; 2-week follow-up. Dimensions of the wound circumference look quite good. There is tunneling at 4.3 cm at 1:00 versus 4.7 last time. The area continues to look like it  is contracting with the wound VAC and I think the treatment of choice here will be continuing to the wound VAC as long as it seems practical. There is no evidence of infection. As far as I am aware he is offloading this aggressively 9/16; 2-week follow-up. Left greater trochanter. Dimensions about the same still about 4.9 cm of maximal undermining. Nevertheless the visible tissue that I can see looks healthy. We have a wound VAC and really from the perspective of how this looks initially this is gotten a lot better however not much change in the last week 9/30 2-week follow-up. Left greater trochanter. He has a tunnel at 12:00 that goes  about 7 cm. From 10-12 lesser degrees of undermining. HOWEVER he comes in today telling us that he is going to have to go back to work. He will not be able to work with a wound VAC and he wants that discontinued. There was not much discussion. This is a financial issue for him. He also has erythema around the wound orifice which has satellite lesions around it this is no doubt cutaneous candidiasis 10/12; 2-week follow-up. Left greater trochanter. 7.2 cm of tunneling. 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 He has not been systemically unwell. 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 He is using silver alginate which he is dressing himself, ABDs with a large plastic occlusive dressing. Dimensions are somewhat better although this is an irregular wound. When he was here the last time he had a lot of drainage coming out of this wound. The culture I did showed methicillin sensitive staph aureus. I gave him 10 days of doxycycline and he is just finishing up on this. He states the drainage is a lot better 04/03/2020 upon evaluation today patient appears to be doing well with regard to his wound as far as the overall appearance is concerned the one issue that he is noting is that he does have some green drainage occurring at times. Obviously this brings up the concern for Pseudomonas I will see anything visually right now that has me overly concerned about at the same time that history does concern me. The patient would like to see if we can attempt to treat this today. I think that being that green drainage is pretty much pathognomonic for a Pseudomonas infection I think he can continue to doxycycline but probably should do a couple weeks of Cipro to try to see if that clears up the drainage if not then we may need to delve into a more extensive culture  possibly PCR at that point. With regard to the wound itself also believe based on talking with the patient today that he could be a candidate for a flap surgery that something he would like to consider Electronic Signature(s) Signed: 04/03/2020 10:11:00 AM By: Worthy Keeler PA-C Entered By: Worthy Keeler on 04/03/2020 10:11:00 -------------------------------------------------------------------------------- Physical Exam Details Patient Name: Date of Service: Ivan [name], [name] 04/03/2020 9:00 A M Medical Record Number: [id number] Patient Account Number: [id number] Date of Birth/Sex: Treating RN: [birth year] (74 y.o. [name] Primary Care Provider: [name] Other Clinician: Referring Provider: Treating Provider/Extender: Octaviano Glow, Carina Weeks in Treatment: 21 Constitutional Well-nourished and well-hydrated in no acute distress. Respiratory normal breathing without difficulty. Psychiatric this patient is able to make  decisions and demonstrates good insight into disease process. Alert and Oriented x 3. pleasant and cooperative. Notes Upon inspection today patient showed signs of fairly clean looking surface although I did not see the green drainage personally myself today he tells me he has been noticing this over the past several days. I do believe that that does raise the concern for a low-lying Pseudomonas infection and for that reason I think it may be beneficial for [place] to go ahead and place him on Cipro while he finishes up the last of the doxycycline. Patient is in agreement with that plan. Electronic Signature(s) Signed: 04/03/2020 10:11:32 AM By: Worthy Keeler PA-C Entered By: Worthy Keeler on 04/03/2020 10:11:32 -------------------------------------------------------------------------------- Physician Orders Details Patient Name: Date of Service: Ivan [name], [name] 04/03/2020 9:00 A M Medical Record Number: [id number] Patient Account  Number: [id number] Date of Birth/Sex: Treating RN: 1946/01/24 (74 y.o. [name] Primary Care Provider: [name] Other Clinician: Referring Provider: Treating Provider/Extender: Octaviano Glow, Carina Weeks in Treatment: [phone] Verbal / Phone Orders: No Diagnosis Coding ICD-10 Coding Code Description [phone] Pressure ulcer of left hip, stage 4 T81.31XD Disruption of external operation (surgical) wound, not elsewhere classified, subsequent encounter B37.2 Candidiasis of skin and nail L03.116 Cellulitis of left lower limb B95.61 Methicillin susceptible Staphylococcus aureus infection as the cause of diseases classified elsewhere Follow-up Appointments Return appointment in 3 weeks. Dressing Change Frequency Wound #1 Left Trochanter Change Dressing every other day. Skin Barriers/Peri-Wound Care Wound #1 Left Trochanter ntifungal cream - lotrimin cream daily, Ketoconazole in clinic A Wound Cleansing Wound #1 Left Trochanter May shower and wash wound with soap and water. - on days dressing changed Primary Wound Dressing Wound #1 Left Trochanter Calcium Alginate with Silver Secondary Dressing Wound #1 Left Trochanter ABD pad - secure with tape Off-Loading Other: - stay off of left hip to aid in offloading pressure. Consults Plastic Surgery [facility]) - Non healing ulcer on left hip, consult for consideration of flap - (ICD10 L89.224 - Pressure ulcer of left hip, stage 4) Patient Medications llergies: Iodinated Contrast Media, cefepime, meropenem A Notifications Medication Indication Start End 04/03/2020 Cipro DOSE 1 - oral 500 mg tablet - 1 tablet oral taken 2 times per day for 14 days 04/03/2020 nystatin DOSE topical 100,000 unit/gram powder - powder topical applied around the periwound where the rash is currently present 2 times per day until healed Electronic Signature(s) Signed: 04/03/2020 10:13:52 AM By: Worthy Keeler PA-C Entered By:  Worthy Keeler on 04/03/2020 10:13:51 Prescription 04/03/2020 -------------------------------------------------------------------------------- Charletta Cousin PA Patient Name: Provider: Jan 16, 1946 [id number] Date of Birth: NPI#[name] [id number] Sex: DEA #: [phone] Phone #: License #: [place] Patient Address: [address], [place] [address] [place], [place] [address] [phone] Allergies Iodinated Contrast Media; cefepime; meropenem Provider's Orders Plastic Surgery [facility]astic Surgery) - ICD10: J62.836 - Non healing ulcer on left hip, consult for consideration of flap Hand Signature: Date(s): Prescription 04/03/2020 Charletta Cousin PA Patient Name: Provider: [birth year] [id number] Date of Birth: NPI#: [name] [id number] Sex: DEA #: [phone] Phone #: License #: [place] Patient Address: [address], [place] [address] [place], [place] [address] [phone] Allergies Iodinated Contrast Media; cefepime; meropenem Medication Medication: Route: Strength: Form: nystatin topical 100,000 unit/gram powder Class: TOPICAL ANTIFUNGALS Dose: Frequency /  Time: Indication: powder topical applied around the periwound where the rash is currently present 2 times per day until healed Number of Refills: Number of Units: 2 Sixty (60) Gram(s) Generic Substitution: Start Date: End Date: Administered at Facility: Substitution Permitted 25/36/6440 No Note to Pharmacy: Hand Signature: Date(s): Electronic Signature(s) Signed: 04/05/2020 5:04:05 PM By: Worthy Keeler PA-C Entered By: Worthy Keeler on 04/03/2020 10:13:53 -------------------------------------------------------------------------------- Problem List Details Patient Name: Date of Service: Ivan [name], [name] 04/03/2020  9:00 A M Medical Record Number: [id number] Patient Account Number: [id number] Date of Birth/Sex: Treating RN: 1945/12/02 (74 y.o. [name] Primary Care Provider: [name] Other Clinician: Referring Provider: Treating Provider/Extender: Octaviano Glow, Carina Weeks in Treatment: 35 Active Problems ICD-10 Encounter Code Description Active Date MDM Diagnosis L89.224 Pressure ulcer of left hip, stage 4 08/05/2019 No Yes T81.31XD Disruption of external operation (surgical) wound, not elsewhere classified, 07/29/2019 No Yes subsequent encounter B37.2 Candidiasis of skin and nail 02/10/2020 No Yes L03.116 Cellulitis of left lower limb 03/14/2020 No Yes B95.61 Methicillin susceptible Staphylococcus aureus infection as the cause of 03/14/2020 No Yes diseases classified elsewhere Inactive Problems Resolved Problems Electronic Signature(s) Signed: 04/03/2020 9:17:55 AM By: Worthy Keeler PA-C Entered By: Worthy Keeler on 04/03/2020 09:17:54 -------------------------------------------------------------------------------- Progress Note Details Patient Name: Date of Service: Ivan [name], [name] 04/03/2020 9:00 A M Medical Record Number: [id number] Patient Account Number: [id number] Date of Birth/Sex: Treating RN: August 30, 1945 (74 y.o. 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 He required admission to hospital from 1/5 through 1/9 with Covid pneumonia. At that point he apparently left AMA. He was noted to have a left hip bruise at the time of  discharge. I suspect this really was a deep tissue injury. He was readmitted from 1/13 through 1//27 with altered mental status Covid pneumonia and delirium. At that point he had a left hip and thigh wound which had worsened. Felt to be a pressure injury. He was seen by general surgery who recommended normal saline wet-to-dry twice daily he did not have a debridement. After he left at that point he was incapacitated by increasing lumbar pain. An MRI on 2/9 showed discitis and osteomyelitis at L1/2. He asked aspiration of 1 cm fluid however I think this culture negative. He was also noted at this point to have a very large wound over the left greater trochanter. He was taken to the OR by Dr. Marla Roe for debridement with ACell placement. He was placed in a wound VAC. A culture at that point showed Enterococcus and Enterobacter. An MRI on 3/15 showed no evidence of left hip septic arthritis or osteomyelitis. Extensive subcutaneous edema within the visualized soft tissue of the left thigh and prominent left inguinal lymph nodes likely reactive. He was started on IV cefepime and vancomycin for 6 weeks. He was discharged to [facility] facility as the patient. There I gather the wound developed the necrotic surface. He was seen by the wound care doctor in house who correctly pointed out that a wound VAC would not do anything with a necrotic surface. Since then he has been using Santyl The  patient is now at home. He was on vancomycin and cefepime. He developed hives and a generalized rash and was seen in the ER on 3/15 by Dr. Cain Sieve recommendation he was changed to meropenem however he also apparently developed hives after this. He says the original hives stopped after the cefepime was stopped and follow-up hives only developed after the meropenem. He is only taken 1 dose of the fear of the meropenem and now is on vancomycin. 3/25; the patient was seen by Dr. Linus Salmons on 3/22. Because of intolerances to  meropenem which I communicated with Dr. De Burrs about he is now on daptomycin and Cipro until sometime in early April. He seems to be tolerating the antibiotics well. We are using Santyl back by wet-to-dry dressings. And making some improvement in the general surface of this very large wound 4/8; we are using Santyl back by wet-to-dry. We are making progress in terms of prepping the wound for a wound VAC. Still necrotic material requiring debridement. 4/16; we are using Santyl back by wet-to-dry. Not convinced that his wife is packing this properly there is a 6 cm tunnel at 12:00. We brought this to attention. Still requiring mechanical debridement 4/29; 2-week follow-up. In general the wound is better however there is considerable tunneling tunneling to the greater trochanter itself. There is still some debris in the deeper parts of this wound I did not do mechanical debridement on this today. I am going to change the dressing to quarter strength Dakin's wet-to-dry. We will put a wound VAC through his insurance. His wife has had brain damage from a previous meningioma surgery. She is not comfortable around strangers in her home. The patient is concerned about this 5/18; patient is using quarter strength Dakin's wet to dry. The problem is a deep pressure area over the left greater trochanter. There was some question from her intake nurses that this is actually come in and some question raised about whether he would require a wound VAC or not. The issue with the wound VAC was complicated predominantly by the patient's reluctance to allow people into his home i.e. home health although he tells me he has this mostly "sorted out" 7/1; this is a patient that I have not seen in almost 6 weeks. Apparently canceled several appointments related to family problems deaths in the family etc. He is wife has ophthalmologic surgery next week. I had been trying to get a wound VAC ordered for this deep probing wound  over the left greater trochanter but so far I think he is simply doing a Dakin's wet-to-dry and our intake nurse did not think there was any Dakin's on it. I have a note from Dr. Tommy Medal of infectious disease on 6/30. He has now been off doxycycline and ciprofloxacin for several weeks. He has no undue pain or drainage. He also had discitis and osteomyelitis at L1-L2 with a 1 cm left psoas abscess as well as some increased signal at T10/T11. He is not having any problems related to this. 12/16/2019 on evaluation today patient's wound actually is showing some signs of bit of improvement which is good news. Fortunately there is no signs of active infection at this time which is good news. He tells me that the nurses have been coming to change this 3 times a week and doing a great job. He told the intake nurse that they had only been coming out once a week. I am not really sure what is going on with the discrepancy  and stories here. Nonetheless I do believe that he needs to have this changed 3 times a week on a regular basis he does seem to have a little bit of a fungal infection around the wound. 8/19; 2-week follow-up. I have not seen this patient in over 6 weeks. There is been a dramatic improvement in the overall wound volume. Circumference of the wound is now small although there is still considerable undermining from roughly 9-2 o'clock maximum 1:00 at roughly 4.7 cm. There is no purulent drainage. He has some irritation around the wound on the normal skin. They are using miconazole on this. 9/2; 2-week follow-up. Dimensions of the wound circumference look quite good. There is tunneling at 4.3 cm at 1:00 versus 4.7 last time. The area continues to look like it is contracting with the wound VAC and I think the treatment of choice here will be continuing to the wound VAC as long as it seems practical. There is no evidence of infection. As far as I am aware he is offloading this aggressively 9/16; 2-week  follow-up. Left greater trochanter. Dimensions about the same still about 4.9 cm of maximal undermining. Nevertheless the visible tissue that I can see looks healthy. We have a wound VAC and really from the perspective of how this looks initially this is gotten a lot better however not much change in the last week 9/30 2-week follow-up. Left greater trochanter. He has a tunnel at 12:00 that goes about 7 cm. From 10-12 lesser degrees of undermining. HOWEVER he comes in today telling us that he is going to have to go back to work. He will not be able to work with a wound VAC and he wants that discontinued. There was not much discussion. This is a financial issue for him. He also has erythema around the wound orifice which has satellite lesions around it this is no doubt cutaneous candidiasis 10/12; 2-week follow-up. Left greater trochanter. 7.2 cm of tunneling. 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 He has not been systemically unwell. 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 He is using silver alginate which he is dressing himself, ABDs with a large plastic occlusive dressing. Dimensions are somewhat better although this is an irregular wound. When he was here the last time he had a lot of drainage coming out of this wound. The culture I did showed methicillin sensitive staph aureus. I gave him 10 days of doxycycline and he is just finishing up on this. He states the drainage is a lot better 04/03/2020 upon evaluation today patient appears to be doing well with regard to his wound as far as the overall appearance is concerned the one issue that he is noting is that he does have some green drainage occurring at times. Obviously this brings up the concern for Pseudomonas I will see anything visually right now that has me overly concerned about at the same  time that history does concern me. The patient would like to see if we can attempt to treat this today. I think that being that green drainage is pretty much pathognomonic for a Pseudomonas infection I think he can continue to doxycycline but probably should do a couple weeks of Cipro to try to see if that clears up the drainage if not then we may need to delve into a  more extensive culture possibly PCR at that point. With regard to the wound itself also believe based on talking with the patient today that he could be a candidate for a flap surgery that something he would like to consider Objective Constitutional Well-nourished and well-hydrated in no acute distress. Vitals Time Taken: 9:20 AM, Height: 70 in, Weight: 198 lbs, BMI: 28.4, Temperature: 98.4 F, Pulse: 73 bpm, Respiratory Rate: 18 breaths/min, Blood Pressure: 105/65 mmHg. Respiratory normal breathing without difficulty. Psychiatric this patient is able to make decisions and demonstrates good insight into disease process. Alert and Oriented x 3. pleasant and cooperative. General Notes: Upon inspection today patient showed signs of fairly clean looking surface although I did not see the green drainage personally myself today he tells me he has been noticing this over the past several days. I do believe that that does raise the concern for a low-lying Pseudomonas infection and for that reason I think it may be beneficial for [place] to go ahead and place him on Cipro while he finishes up the last of the doxycycline. Patient is in agreement with that plan. Integumentary (Hair, Skin) Wound #1 status is Open. Original cause of wound was Surgical Injury. The wound is located on the Left Trochanter. The wound measures 1.8cm length x 1.6cm width x 2.8cm depth; 2.262cm^2 area and 6.333cm^3 volume. There is muscle and Fat Layer (Subcutaneous Tissue) exposed. There is no undermining noted, however, there is tunneling at 12:00 with a maximum  distance of 3.6cm. There is a medium amount of serous drainage noted. The wound margin is well defined and not attached to the wound base. There is large (67-100%) red, pink granulation within the wound bed. There is no necrotic tissue within the wound bed. Assessment Active Problems ICD-10 Pressure ulcer of left hip, stage 4 Disruption of external operation (surgical) wound, not elsewhere classified, subsequent encounter Candidiasis of skin and nail Cellulitis of left lower limb Methicillin susceptible Staphylococcus aureus infection as the cause of diseases classified elsewhere Plan Follow-up Appointments: Return appointment in 3 weeks. Dressing Change Frequency: Wound #1 Left Trochanter: Change Dressing every other day. Skin Barriers/Peri-Wound Care: Wound #1 Left Trochanter: Antifungal cream - lotrimin cream daily, Ketoconazole in clinic Wound Cleansing: Wound #1 Left Trochanter: May shower and wash wound with soap and water. - on days dressing changed Primary Wound Dressing: Wound #1 Left Trochanter: Calcium Alginate with Silver Secondary Dressing: Wound #1 Left Trochanter: ABD pad - secure with tape Off-Loading: Other: - stay off of left hip to aid in offloading pressure. Consults ordered were: Plastic Surgery [facility]astic Surgery) - Non healing ulcer on left hip, consult for consideration of flap The following medication(s) was prescribed: Cipro oral 500 mg tablet 1 1 tablet oral taken 2 times per day for 14 days starting 04/03/2020 nystatin topical 100,000 unit/gram powder powder topical applied around the periwound where the rash is currently present 2 times per day until healed starting 04/03/2020 1. I would recommend currently that we go ahead and continue with the silver alginate dressing packed into the wound I think that is doing a fairly good job for him. 2. I am also can recommend he continue to use the nystatin powder around the edges of the wound. 3. I am also  can recommend currently that we go ahead and give him a prescription for Cipro which I did send into the pharmacy for him today as well due to the fact that he notes he did have some green type drainage coming  from the wound itself. 4. I am also can make a referral to [facility] plastic surgery for consideration of flap closure based on our conversation today the patient thinks that would be a good option for him if it something that is recommended as being appropriate. We will see patient back for reevaluation in 3 weeks here in the clinic. If anything worsens or changes patient will contact our office for additional recommendations. Electronic Signature(s) Signed: 04/03/2020 10:16:45 AM By: Worthy Keeler PA-C Entered By: Worthy Keeler on 04/03/2020 10:16:45 -------------------------------------------------------------------------------- SuperBill Details Patient Name: Date of Service: Ivan [name], [name] 04/03/2020 Medical Record Number: [id number] Patient Account Number: [id number] Date of Birth/Sex: Treating RN: [birth year] (74 y.o. [name] Primary Care Provider: [name] Other Clinician: Referring Provider: Treating Provider/Extender: Octaviano Glow, Carina Weeks in Treatment: 35 Diagnosis Coding ICD-10 Codes Code Description 8176598040 Pressure ulcer of left hip, stage 4 T81.31XD Disruption of external operation (surgical) wound, not elsewhere classified, subsequent encounter B37.2 Candidiasis of skin and nail L03.116 Cellulitis of left lower limb B95.61 Methicillin susceptible Staphylococcus aureus infection as the cause of diseases classified elsewhere Facility Procedures The patient participates with Medicare or their insurance follows the Medicare Facility Guidelines: CPT4 Code Description Modifier Quantity [id number] [place] VISIT-LEV 3 EST PT 1 Physician Procedures : CPT4 Code Description Modifier [id number] - WC PHYS LEVEL 3 - EST PT ICD-10  Diagnosis Description L89.224 Pressure ulcer of left hip, stage 4 T81.31XD Disruption of external operation (surgical) wound, not elsewhere classified, subsequent encounter  B37.2 Candidiasis of skin and nail L03.116 Cellulitis of left lower limb Quantity: 1 Electronic Signature(s) Signed: 04/03/2020 5:46:54 PM By: Levan Hurst RN, BSN Signed: 04/05/2020 5:04:05 PM By: Worthy Keeler PA-C Previous Signature: 04/03/2020 10:16:58 AM Version By: Worthy Keeler PA-C Entered By: Levan Hurst on 04/03/2020 11:56:13

## 2020-04-04 NOTE — Progress Notes (Signed)
Ivan [name], Ivan [name] ([id number]) Visit Report for 04/03/2020 Arrival Information Details Patient Name: Date of Service: Ivan [name]. 04/03/2020 9:00 A M Medical Record Number: [id number] Patient Account Number: [id number] Date of Birth/Sex: Treating RN: 1945-06-04 (74 y.o. [name] Primary Care Miyah Hampshire: [name] Other Clinician: Referring Daley Mooradian: Treating Jesly Hartmann/Extender: Octaviano Glow, Carina Weeks in Treatment: 78 Visit Information History Since Last Visit Added or deleted any medications: No Patient Arrived: Ambulatory Any new allergies or adverse reactions: No Arrival Time: 09:18 Had a fall or experienced change in No Accompanied By: self activities of daily living that may affect Transfer Assistance: None risk of falls: Patient Identification Verified: Yes Signs or symptoms of abuse/neglect since last visito No Secondary Verification Process Completed: Yes Hospitalized since last visit: No Patient Requires Transmission-Based Precautions: No Implantable device outside of the clinic excluding No Patient Has Alerts: No cellular tissue based products placed in the center since last visit: Has Dressing in Place as Prescribed: Yes Pain Present Now: No Electronic Signature(s) Signed: 04/04/2020 9:45:09 AM By: Sandre Kitty Entered By: Sandre Kitty on 04/03/2020 09:20:06 -------------------------------------------------------------------------------- Clinic Level of Care Assessment Details Patient Name: Date of Service: Ivan [name]. 04/03/2020 9:00 A M Medical Record Number: [id number] Patient Account Number: [id number] Date of Birth/Sex: Treating RN: [birth year] (74 y.o. [name] Primary Care Tanzie Rothschild: [name] Other Clinician: Referring Tishana Clinkenbeard: Treating Jakarius Flamenco/Extender: Octaviano Glow, Carina Weeks in Treatment: 35 Clinic Level of Care Assessment Items TOOL 4 Quantity Score X- 1 0 Use when only an  EandM is performed on FOLLOW-UP visit ASSESSMENTS - Nursing Assessment / Reassessment X- 1 10 Reassessment of Co-morbidities (includes updates in patient status) X- 1 5 Reassessment of Adherence to Treatment Plan ASSESSMENTS - Wound and Skin A ssessment / Reassessment X - Simple Wound Assessment / Reassessment - one wound 1 5 []  - 0 Complex Wound Assessment / Reassessment - multiple wounds []  - 0 Dermatologic / Skin Assessment (not related to wound area) ASSESSMENTS - Focused Assessment []  - 0 Circumferential Edema Measurements - multi extremities []  - 0 Nutritional Assessment / Counseling / Intervention []  - 0 Lower Extremity Assessment (monofilament, tuning fork, pulses) []  - 0 Peripheral Arterial Disease Assessment (using hand held doppler) ASSESSMENTS - Ostomy and/or Continence Assessment and Care []  - 0 Incontinence Assessment and Management []  - 0 Ostomy Care Assessment and Management (repouching, etc.) PROCESS - Coordination of Care X - Simple Patient / Family Education for ongoing care 1 15 []  - 0 Complex (extensive) Patient / Family Education for ongoing care X- 1 10 Staff obtains Consents, Records, T Results / Process Orders est []  - 0 Staff telephones HHA, Nursing Homes / Clarify orders / etc []  - 0 Routine Transfer to another Facility (non-emergent condition) []  - 0 Routine Hospital Admission (non-emergent condition) []  - 0 New Admissions / Biomedical engineer / Ordering NPWT Apligraf, etc. , []  - 0 Emergency Hospital Admission (emergent condition) X- 1 10 Simple Discharge Coordination []  - 0 Complex (extensive) Discharge Coordination PROCESS - Special Needs []  - 0 Pediatric / Minor Patient Management []  - 0 Isolation Patient Management []  - 0 Hearing / Language / Visual special needs []  - 0 Assessment of Community assistance (transportation, D/C planning, etc.) []  - 0 Additional assistance / Altered mentation []  - 0 Support Surface(s)  Assessment (bed, cushion, seat, etc.) INTERVENTIONS - Wound Cleansing / Measurement X - Simple Wound Cleansing - one wound 1 5 []  - 0 Complex Wound  Cleansing - multiple wounds X- 1 5 Wound Imaging (photographs - any number of wounds) []  - 0 Wound Tracing (instead of photographs) X- 1 5 Simple Wound Measurement - one wound []  - 0 Complex Wound Measurement - multiple wounds INTERVENTIONS - Wound Dressings X - Small Wound Dressing one or multiple wounds 1 10 []  - 0 Medium Wound Dressing one or multiple wounds []  - 0 Large Wound Dressing one or multiple wounds X- 1 5 Application of Medications - topical []  - 0 Application of Medications - injection INTERVENTIONS - Miscellaneous []  - 0 External ear exam []  - 0 Specimen Collection (cultures, biopsies, blood, body fluids, etc.) []  - 0 Specimen(s) / Culture(s) sent or taken to Lab for analysis []  - 0 Patient Transfer (multiple staff / Civil Service fast streamer / Similar devices) []  - 0 Simple Staple / Suture removal (25 or less) []  - 0 Complex Staple / Suture removal (26 or more) []  - 0 Hypo / Hyperglycemic Management (close monitor of Blood Glucose) []  - 0 Ankle / Brachial Index (ABI) - do not check if billed separately X- 1 5 Vital Signs Has the patient been seen at the hospital within the last three years: Yes Total Score: 90 Level Of Care: New/Established - Level 3 Electronic Signature(s) Signed: 04/03/2020 5:46:54 PM By: Levan Hurst RN, BSN Entered By: Levan Hurst on 04/03/2020 11:55:56 -------------------------------------------------------------------------------- Encounter Discharge Information Details Patient Name: Date of Service: Ivan [name], Ivan BERT W. 04/03/2020 9:00 A M Medical Record Number: [id number] Patient Account Number: [id number] Date of Birth/Sex: Treating RN: 24-Oct-1945 (74 y.o. [name] Primary Care Jenayah Antu: [name] Other Clinician: Referring Aislyn Hayse: Treating Ewing Fandino/Extender: Octaviano Glow, Carina Weeks in Treatment: [phone] Encounter Discharge Information Items Discharge Condition: Stable Ambulatory Status: Ambulatory Discharge Destination: Home Transportation: Private Auto Accompanied By: self Schedule Follow-up Appointment: Yes Clinical Summary of Care: Patient Declined Electronic Signature(s) Signed: 04/03/2020 5:18:41 PM By: Rhae Hammock RN Entered By: Rhae Hammock on 04/03/2020 10:17:33 -------------------------------------------------------------------------------- Lower Extremity Assessment Details Patient Name: Date of Service: Ivan [name], Ivan BERT W. 04/03/2020 9:00 A M Medical Record Number: [id number] Patient Account Number: [id number] Date of Birth/Sex: Treating RN: January 23, 1946 (74 y.o. [name] Primary Care Lavenia Stumpo: [name] Other Clinician: Referring Tamyka Bezio: Treating Keni Elison/Extender: Octaviano Glow, Carina Weeks in Treatment: 35 Electronic Signature(s) Signed: 04/03/2020 4:59:01 PM By: Baruch Gouty RN, BSN Entered By: Baruch Gouty on 04/03/2020 09:23:10 -------------------------------------------------------------------------------- Multi-Disciplinary Care Plan Details Patient Name: Date of Service: Ivan [name], Ivan BERT W. 04/03/2020 9:00 A M Medical Record Number: [id number] Patient Account Number: [id number] Date of Birth/Sex: Treating RN: 1945/07/18 (74 y.o. [name] Primary Care Marv Alfrey: [name] Other Clinician: Referring Aleks Nawrot: Treating Lura Falor/Extender: Octaviano Glow, Carina Weeks in Treatment: 35 Active Inactive Wound/Skin Impairment Nursing Diagnoses: Knowledge deficit related to ulceration/compromised skin integrity Goals: Patient/caregiver will verbalize understanding of skin care regimen Date Initiated: 07/29/2019 Target Resolution Date: 05/12/2020 Goal Status: Active Interventions: Assess patient/caregiver ability to obtain necessary supplies Assess  patient/caregiver ability to perform ulcer/skin care regimen upon admission and as needed Provide education on ulcer and skin care Treatment Activities: Skin care regimen initiated : 07/29/2019 Topical wound management initiated : 07/29/2019 Notes: Electronic Signature(s) Signed: 04/03/2020 5:46:54 PM By: Levan Hurst RN, BSN Entered By: Levan Hurst on 04/03/2020 10:05:37 -------------------------------------------------------------------------------- Pain Assessment Details Patient Name: Date of Service: Ivan [name], Ivan BERT W. 04/03/2020 9:00 A M Medical Record Number: [id number] Patient Account Number: [id number] Date of Birth/Sex: Treating RN: 1945-12-28 (73  y.o. [name]) Levan Hurst Primary Care Rainee Sweatt: [name] Other Clinician: Referring Cheng Dec: Treating Alyric Parkin/Extender: Octaviano Glow, Carina Weeks in Treatment: 35 Active Problems Location of Pain Severity and Description of Pain Patient Has Paino No Site Locations Pain Management and Medication Current Pain Management: Electronic Signature(s) Signed: 04/03/2020 5:46:54 PM By: Levan Hurst RN, BSN Signed: 04/04/2020 9:45:09 AM By: Sandre Kitty Entered By: Sandre Kitty on 04/03/2020 09:21:04 -------------------------------------------------------------------------------- Patient/Caregiver Education Details Patient Name: Date of Service: [place] M, Ivan BERT W. 11/22/2021andnbsp9:00 A M Medical Record Number: [id number] Patient Account Number: [id number] Date of Birth/Gender: Treating RN: Jan 05, 1946 (74 y.o. [name] Primary Care Physician: [name] Other Clinician: Referring Physician: Treating Physician/Extender: Perry Mount in Treatment: 35 Education Assessment Education Provided To: Patient Education Topics Provided Wound/Skin Impairment: Methods: Explain/Verbal Responses: State content correctly [employer]) Signed: 04/03/2020 5:46:54  PM By: Levan Hurst RN, BSN Entered By: Levan Hurst on 04/03/2020 10:06:28 -------------------------------------------------------------------------------- Wound Assessment Details Patient Name: Date of Service: Ivan [name], Ivan BERT W. 04/03/2020 9:00 A M Medical Record Number: [id number] Patient Account Number: [id number] Date of Birth/Sex: Treating RN: 02-Apr-1946 (74 y.o. [name] Primary Care Anyi Fels: [name] Other Clinician: Referring Kaydance Bowie: Treating Matthew Pais/Extender: Octaviano Glow, Carina Weeks in Treatment: 35 Wound Status Wound Number: 1 Primary Pressure Ulcer Etiology: Wound Location: Left Trochanter Secondary Open Surgical Wound Wounding Event: Surgical Injury Etiology: Date Acquired: 07/01/2019 Wound Open Weeks Of Treatment: 35 Status: Clustered Wound: No Comorbid Cataracts, Coronary Artery Disease, Hypertension, Myocardial History: Infarction, Osteoarthritis, Osteomyelitis Wound Measurements Length: (cm) 1.8 Width: (cm) 1.6 Depth: (cm) 2.8 Area: (cm) 2.262 Volume: (cm) 6.333 % Reduction in Area: 89.5% % Reduction in Volume: 79% Epithelialization: None Tunneling: Yes Position (o'clock): 12 Maximum Distance: (cm) 3.6 Undermining: No Wound Description Classification: Category/Stage IV Wound Margin: Well defined, not attached Exudate Amount: Medium Exudate Type: Serous Exudate Color: amber Foul Odor After Cleansing: No Slough/Fibrino No Wound Bed Granulation Amount: Large (67-100%) Exposed Structure Granulation Quality: Red, Pink Fascia Exposed: No Necrotic Amount: None Present (0%) Fat Layer (Subcutaneous Tissue) Exposed: Yes Tendon Exposed: No Muscle Exposed: Yes Necrosis of Muscle: No Joint Exposed: No Bone Exposed: No Treatment Notes Wound #1 (Left Trochanter) 1. Cleanse With Saline Soap and water 2. Periwound Care Antifungal cream Barrier cream 3. Primary Dressing Applied Calcium Alginate Ag 4.  Secondary Dressing ABD Pad 5. Secured With Tape Notes barrier cream and ketokonazole cream Electronic Signature(s) Signed: 04/03/2020 4:59:01 PM By: Baruch Gouty RN, BSN Entered By: Baruch Gouty on 04/03/2020 09:25:10 -------------------------------------------------------------------------------- [place] Details Patient Name: Date of Service: Ivan [name], Ivan BERT W. 04/03/2020 9:00 A M Medical Record Number: [id number] Patient Account Number: [id number] Date of Birth/Sex: Treating RN: 03/28/1946 (74 y.o. [name] Primary Care Kden Wagster: [name] Other Clinician: Referring Yvonne Petite: Treating Kariyah Baugh/Extender: Octaviano Glow, Carina Weeks in Treatment: 35 Vital Signs Time Taken: 09:20 Temperature (F): 98.4 Height (in): 70 Pulse (bpm): 73 Weight (lbs): 198 Respiratory Rate (breaths/min): 18 Body Mass Index (BMI): 28.4 Blood Pressure (mmHg): 105/65 Reference Range: 80 - 120 mg / dl Electronic Signature(s) Signed: 04/04/2020 9:45:09 AM By: Sandre Kitty Entered By: Sandre Kitty on 04/03/2020 09:20:59

## 2020-04-08 ENCOUNTER — Other Ambulatory Visit: Payer: Medicare HMO

## 2020-04-11 ENCOUNTER — Other Ambulatory Visit: Payer: Self-pay

## 2020-04-11 ENCOUNTER — Ambulatory Visit
Admission: RE | Admit: 2020-04-11 | Discharge: 2020-04-11 | Disposition: A | Discharge status: Home / Self Care | Payer: Medicare HMO | Source: Ambulatory Visit | Attending: Infectious Diseases | Admitting: Infectious Diseases

## 2020-04-11 DIAGNOSIS — L89224 Pressure ulcer of left hip, stage 4: Secondary | ICD-10-CM

## 2020-04-14 ENCOUNTER — Ambulatory Visit (INDEPENDENT_AMBULATORY_CARE_PROVIDER_SITE_OTHER): Payer: Medicare HMO | Admitting: Infectious Diseases

## 2020-04-14 ENCOUNTER — Encounter: Payer: Self-pay | Admitting: Infectious Diseases

## 2020-04-14 ENCOUNTER — Other Ambulatory Visit: Payer: Self-pay

## 2020-04-14 DIAGNOSIS — L89224 Pressure ulcer of left hip, stage 4: Secondary | ICD-10-CM

## 2020-04-14 DIAGNOSIS — I10 Essential (primary) hypertension: Secondary | ICD-10-CM | POA: Diagnosis not present

## 2020-04-14 NOTE — Assessment & Plan Note (Signed)
Well controlled 

## 2020-04-14 NOTE — Assessment & Plan Note (Addendum)
clinically doing well but radiologically has worsened.  Will see if we can get him back in with Dr Marla Roe (for [facility]? [facility]?) He does not want to go to [facility]  Will defer on putting him back on prolonged IV anbx for now, his po did not seem to alter his course.  rtc in 1 month, repeat ESR and CRP today

## 2020-04-14 NOTE — Progress Notes (Signed)
Subjective:    Patient ID: Ivan [name], male    DOB: Feb 05, 1946, 74 y.o.   MRN: [id number]  HPI 74y.o.maleRN admitted to the hospital on 05/18/2019 and found to have Covid infection. Admission notes also indicate that they thought he had new abdominal pain. He was treated for community-acquired pneumonia and a UTI in addition to receiving 4 days of remdesivir and steroids before leaving [facility]. He continued to have delirium and severe back pain after discharge and laid on the floor for 4 days straight prior to being readmitted on 05/26/2019. New left hip pressure wound. He was treated empirically for possible sepsis and received 3 days of vancomycin, cefepime and metronidazole. He was discharged on 06/09/2019. On 06/22/2019 he had MRI which showed: 1. Findings concerning for discitis-osteomyelitis at L1-2 with a possible 1 cm left psoas abscess. No evidence of epidural abscess. 2. Less pronounced fluid signal in the T10-11, L2-3, and L5-S1 discs without convincing evidence of osteomyelitis at these levels. 3. Increased, mild spinal stenosis at L1-2. 4. Up to mild spinal and severe neural foraminal stenosis at other levels as above.  Dr Marla Roe did I and D of large decubitus ulcer and left left hip wound and recovered Enterococcus and Enterobacter species he was being treated with vancomycin and cefepime but developed a severe rash changed to vancomycin and meropenem but then still had a rash now changed to daptomycin and ciprofloxacin.  His wound on his hip has not closed.  Hehas been seeingDr. Dellia Nims in wound care, his last f/u was 10-12. He had a Cx done at that time for a L hip wound (7 cm tunneling) that grew MSSA.  He prev has wound vac on this wound.  He was called 02-29-20 by Dr Dellia Nims to start doxy 162m bid for 10 days.  He was seen in ID then as well and sent for f/u MRI: 1. Increased ill-defined left peritrochanteric bursal fluid collection measuring  5.9 cm, previously 3.7 cm. This communicates with an open soft tissue wound over the lateral hip. 2. No evidence of left hip septic arthritis or osteomyelitis. 3. New bilateral gluteus minimus partial tearing.  His doxy was changed to doxy/cipro and was given for 8 weeks.   He continues to have drainage from his wound- NS and daikens. Is able to work and bears wt normally. Wound  Has not been back to Dr DMarla Roerecently.  He has had eval wt wound care center 04-03-20.  He was also seen in urgent care for a CXR (-) after having a quantiferon gold +.   ESR 2 (11-10-19) CRP 7.4 (11-10-19)   Review of Systems  Constitutional: Negative for chills and unexpected weight change.  Respiratory: Positive for shortness of breath.   Gastrointestinal: Negative for constipation and diarrhea.  Genitourinary: Positive for decreased urine volume. Negative for difficulty urinating.  Skin: Positive for wound.  notes venous insuff Has had arterial w/u (-).  Has had urology w/u. BPH noted.  Please see HPI. All other systems reviewed and negative.      Objective:   Physical Exam Vitals reviewed.  Constitutional:      Appearance: Normal appearance.  HENT:     Mouth/Throat:     Mouth: Mucous membranes are moist.     Pharynx: No oropharyngeal exudate.  Eyes:     Extraocular Movements: Extraocular movements intact.     Pupils: Pupils are equal, round, and reactive to light.  Cardiovascular:     Rate and Rhythm:  Normal rate and regular rhythm.  Pulmonary:     Effort: Pulmonary effort is normal.     Breath sounds: Normal breath sounds.  Abdominal:     General: Bowel sounds are normal. There is no distension.     Palpations: Abdomen is soft.     Tenderness: There is no abdominal tenderness.  Musculoskeletal:     Cervical back: Normal range of motion and neck supple.     Right lower leg: Edema present.     Left lower leg: Edema present.  Skin:    General: Skin is warm and dry.         Neurological:     General: No focal deficit present.     Mental Status: He is alert.            Assessment & Plan:

## 2020-04-15 LAB — SEDIMENTATION RATE: Sed Rate: 2 mm/h (ref 0–20)

## 2020-04-15 LAB — C-REACTIVE PROTEIN: CRP: 3.7 mg/L (ref <8.0)
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
## 2020-04-21 ENCOUNTER — Telehealth (INDEPENDENT_AMBULATORY_CARE_PROVIDER_SITE_OTHER): Payer: Medicare HMO | Admitting: Physician Assistant

## 2020-04-21 ENCOUNTER — Other Ambulatory Visit: Payer: Self-pay

## 2020-04-21 ENCOUNTER — Encounter: Payer: Self-pay | Admitting: Physician Assistant

## 2020-04-21 ENCOUNTER — Ambulatory Visit (INDEPENDENT_AMBULATORY_CARE_PROVIDER_SITE_OTHER): Payer: Medicare HMO

## 2020-04-21 ENCOUNTER — Telehealth: Payer: Self-pay | Admitting: *Deleted

## 2020-04-21 VITALS — BP 110/68 | Ht 70.0 in | Wt 225.0 lb

## 2020-04-21 DIAGNOSIS — E7800 Pure hypercholesterolemia, unspecified: Secondary | ICD-10-CM

## 2020-04-21 DIAGNOSIS — Z23 Encounter for immunization: Secondary | ICD-10-CM

## 2020-04-21 DIAGNOSIS — Z7189 Other specified counseling: Secondary | ICD-10-CM | POA: Diagnosis not present

## 2020-04-21 DIAGNOSIS — I10 Essential (primary) hypertension: Secondary | ICD-10-CM | POA: Diagnosis not present

## 2020-04-21 DIAGNOSIS — I2510 Atherosclerotic heart disease of native coronary artery without angina pectoris: Secondary | ICD-10-CM

## 2020-04-21 NOTE — Telephone Encounter (Signed)
  Patient Consent for Virtual Visit         Ivan [name] has provided verbal consent on 04/21/2020 for a virtual visit (video or telephone).   CONSENT FOR VIRTUAL VISIT FOR:  Ivan [name]  By participating in this virtual visit I agree to the following:  I hereby voluntarily request, consent and authorize [facility] and its employed or contracted physicians, physician assistants, nurse practitioners or other licensed health care professionals (the Practitioner), to provide me with telemedicine health care services (the "Services") as deemed necessary by the treating Practitioner. I acknowledge and consent to receive the Services by the Practitioner via telemedicine. I understand that the telemedicine visit will involve communicating with the Practitioner through live audiovisual communication technology and the disclosure of certain medical information by electronic transmission. I acknowledge that I have been given the opportunity to request an in-person assessment or other available alternative prior to the telemedicine visit and am voluntarily participating in the telemedicine visit.  I understand that I have the right to withhold or withdraw my consent to the use of telemedicine in the course of my care at any time, without affecting my right to future care or treatment, and that the Practitioner or I may terminate the telemedicine visit at any time. I understand that I have the right to inspect all information obtained and/or recorded in the course of the telemedicine visit and may receive copies of available information for a reasonable fee.  I understand that some of the potential risks of receiving the Services via telemedicine include:  Marland Kitchen Delay or interruption in medical evaluation due to technological equipment failure or disruption; . Information transmitted may not be sufficient (e.g. poor resolution of images) to allow for appropriate medical decision making by the  Practitioner; and/or  . In rare instances, security protocols could fail, causing a breach of personal health information.  Furthermore, I acknowledge that it is my responsibility to provide information about my medical history, conditions and care that is complete and accurate to the best of my ability. I acknowledge that Practitioner's advice, recommendations, and/or decision may be based on factors not within their control, such as incomplete or inaccurate data provided by me or distortions of diagnostic images or specimens that may result from electronic transmissions. I understand that the practice of medicine is not an exact science and that Practitioner makes no warranties or guarantees regarding treatment outcomes. I acknowledge that a copy of this consent can be made available to me via my patient portal ([place]), or I can request a printed copy by calling the office of [facility].    I understand that my insurance will be billed for this visit.   I have read or had this consent read to me. . I understand the contents of this consent, which adequately explains the benefits and risks of the Services being provided via telemedicine.  . I have been provided ample opportunity to ask questions regarding this consent and the Services and have had my questions answered to my satisfaction. . I give my informed consent for the services to be provided through the use of telemedicine in my medical care

## 2020-04-21 NOTE — Patient Instructions (Signed)

## 2020-04-21 NOTE — Progress Notes (Signed)
   Covid-19 Vaccination Clinic  Name:  CATALDO COSGRIFF    MRN: [id number] DOB: Jun 08, 1945  04/21/2020  Mr. Chap was observed post Covid-19 immunization for 15 minutes without incident. He was provided with Vaccine Information Sheet and instruction to access the V-Safe system.   Mr. Fecher was instructed to call 911 with any severe reactions post vaccine: Marland Kitchen Difficulty breathing  . Swelling of face and throat  . A fast heartbeat  . A bad rash all over body  . Dizziness and weakness   Immunizations Administered    Name Date Dose VIS Date Route   Pfizer COVID-19 Vaccine 04/21/2020  9:49 AM 0.3 mL 03/01/2020 Intramuscular   Manufacturer: [place]   Lot: [id number]   NDC: [place], RN

## 2020-04-24 ENCOUNTER — Encounter (HOSPITAL_BASED_OUTPATIENT_CLINIC_OR_DEPARTMENT_OTHER): Payer: Medicare HMO | Attending: Internal Medicine | Admitting: Internal Medicine

## 2020-05-01 ENCOUNTER — Ambulatory Visit: Payer: Medicare HMO | Admitting: Family Medicine

## 2020-05-16 ENCOUNTER — Ambulatory Visit: Payer: Medicare HMO | Admitting: Infectious Diseases

## 2020-07-04 ENCOUNTER — Other Ambulatory Visit: Payer: Self-pay

## 2020-07-04 ENCOUNTER — Encounter (HOSPITAL_BASED_OUTPATIENT_CLINIC_OR_DEPARTMENT_OTHER): Payer: Medicare HMO | Attending: Internal Medicine | Admitting: Internal Medicine

## 2020-07-04 DIAGNOSIS — Z833 Family history of diabetes mellitus: Secondary | ICD-10-CM | POA: Diagnosis not present

## 2020-07-04 DIAGNOSIS — L89224 Pressure ulcer of left hip, stage 4: Secondary | ICD-10-CM | POA: Insufficient documentation

## 2020-07-04 DIAGNOSIS — Z881 Allergy status to other antibiotic agents status: Secondary | ICD-10-CM | POA: Insufficient documentation

## 2020-07-04 DIAGNOSIS — K6812 Psoas muscle abscess: Secondary | ICD-10-CM | POA: Insufficient documentation

## 2020-07-04 DIAGNOSIS — Z87891 Personal history of nicotine dependence: Secondary | ICD-10-CM | POA: Diagnosis not present

## 2020-07-04 NOTE — Progress Notes (Signed)
SHAFIN, POLLIO ([id number]) Visit Report for 07/04/2020 Chief Complaint Document Details Patient Name: Date of Service: Ivan [name]. 07/04/2020 9:00 A M Medical Record Number: [id number] Patient Account Number: [id number] Date of Birth/Sex: Treating RN: 27-Jun-1945 (75 y.o. Male) Rhae Hammock Primary Care Provider: [name] Other Clinician: Referring Provider: Treating Provider/Extender: Cheree Ditto in Treatment: 0 Information Obtained from: Patient Chief Complaint 07/29/2019; patient is here for review of a fairly substantial wound over the left greater trochanter 07/04/2020; patient returns to clinic for follow-up of the original ulcer over the left greater trochanter Electronic Signature(s) Signed: 07/04/2020 5:22:30 PM By: [name] MD Entered By: [name] on 07/04/2020 10:42:48 -------------------------------------------------------------------------------- HPI Details Patient Name: Date of Service: Ivan [name], [name] 07/04/2020 9:00 A M Medical Record Number: [id number] Patient Account Number: [id number] Date of Birth/Sex: Treating RN: Jul 11, 1945 (75 y.o. 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 He required admission to hospital from 1/5 through 1/9 with Covid pneumonia. At that point he apparently left AMA. He was noted to have a left hip bruise at the time of discharge. I suspect this really was a deep tissue injury. He was readmitted from 1/13 through 1//27 with altered mental status Covid pneumonia and delirium. At that point he had a left hip and thigh wound which had worsened. Felt to be a pressure injury. He was seen by general surgery who recommended normal saline  wet-to-dry twice daily he did not have a debridement. After he left at that point he was incapacitated by increasing lumbar pain. An MRI on 2/9 showed discitis and osteomyelitis at L1/2. He asked aspiration of 1 cm fluid however I think this culture negative. He was also noted at this point to have a very large wound over the left greater trochanter. He was taken to the OR by Dr. Marla Roe for debridement with ACell placement. He was placed in a wound VAC. A culture at that point showed Enterococcus and Enterobacter. An MRI on 3/15 showed no evidence of left hip septic arthritis or osteomyelitis. Extensive subcutaneous edema within the visualized soft tissue of the left thigh and prominent left inguinal lymph nodes likely reactive. He was started on IV cefepime and vancomycin for 6 weeks. He was discharged to [facility] facility as the patient. There I gather the wound developed the necrotic surface. He was seen by the wound care doctor in house who correctly pointed out that a wound VAC would not do anything with a necrotic surface. Since then he has been using Santyl The patient is now at home. He was on vancomycin and cefepime. He developed hives and a generalized rash and was seen in the ER on 3/15 by Dr. Cain Sieve recommendation he was changed to meropenem however he also apparently developed hives after this. He says the original hives stopped after the cefepime was stopped and follow-up hives only developed after the meropenem. He is only taken 1 dose of the fear of the meropenem and now is on vancomycin. 3/25; the patient was seen by Dr. Linus Salmons on 3/22. Because of intolerances to meropenem which I communicated with Dr. De Burrs about he is now on daptomycin and Cipro until sometime in early April. He seems to be tolerating the antibiotics well. We are using Santyl back by wet-to-dry  dressings. And making some improvement in the general surface of this very large wound 4/8; we are using Santyl back  by wet-to-dry. We are making progress in terms of prepping the wound for a wound VAC. Still necrotic material requiring debridement. 4/16; we are using Santyl back by wet-to-dry. Not convinced that his wife is packing this properly there is a 6 cm tunnel at 12:00. We brought this to attention. Still requiring mechanical debridement 4/29; 2-week follow-up. In general the wound is better however there is considerable tunneling tunneling to the greater trochanter itself. There is still some debris in the deeper parts of this wound I did not do mechanical debridement on this today. I am going to change the dressing to quarter strength Dakin's wet-to-dry. We will put a wound VAC through his insurance. His wife has had brain damage from a previous meningioma surgery. She is not comfortable around strangers in her home. The patient is concerned about this 5/18; patient is using quarter strength Dakin's wet to dry. The problem is a deep pressure area over the left greater trochanter. There was some question from her intake nurses that this is actually come in and some question raised about whether he would require a wound VAC or not. The issue with the wound VAC was complicated predominantly by the patient's reluctance to allow people into his home i.e. home health although he tells me he has this mostly "sorted out" 7/1; this is a patient that I have not seen in almost 6 weeks. Apparently canceled several appointments related to family problems deaths in the family etc. He is wife has ophthalmologic surgery next week. I had been trying to get a wound VAC ordered for this deep probing wound over the left greater trochanter but so far I think he is simply doing a Dakin's wet-to-dry and our intake nurse did not think there was any Dakin's on it. I have a note from Dr. Tommy Medal of infectious disease on 6/30. He has now been off doxycycline and ciprofloxacin for several weeks. He has no undue pain or drainage. He  also had discitis and osteomyelitis at L1-L2 with a 1 cm left psoas abscess as well as some increased signal at T10/T11. He is not having any problems related to this. 12/16/2019 on evaluation today patient's wound actually is showing some signs of bit of improvement which is good news. Fortunately there is no signs of active infection at this time which is good news. He tells me that the nurses have been coming to change this 3 times a week and doing a great job. He told the intake nurse that they had only been coming out once a week. I am not really sure what is going on with the discrepancy and stories here. Nonetheless I do believe that he needs to have this changed 3 times a week on a regular basis he does seem to have a little bit of a fungal infection around the wound. 8/19; 2-week follow-up. I have not seen this patient in over 6 weeks. There is been a dramatic improvement in the overall wound volume. Circumference of the wound is now small although there is still considerable undermining from roughly 9-2 o'clock maximum 1:00 at roughly 4.7 cm. There is no purulent drainage. He has some irritation around the wound on the normal skin. They are using miconazole on this. 9/2; 2-week follow-up. Dimensions of the wound circumference look quite good. There is tunneling at 4.3 cm at 1:00 versus 4.7 last  time. The area continues to look like it is contracting with the wound VAC and I think the treatment of choice here will be continuing to the wound VAC as long as it seems practical. There is no evidence of infection. As far as I am aware he is offloading this aggressively 9/16; 2-week follow-up. Left greater trochanter. Dimensions about the same still about 4.9 cm of maximal undermining. Nevertheless the visible tissue that I can see looks healthy. We have a wound VAC and really from the perspective of how this looks initially this is gotten a lot better however not much change in the last week 9/30  2-week follow-up. Left greater trochanter. He has a tunnel at 12:00 that goes about 7 cm. From 10-12 lesser degrees of undermining. HOWEVER he comes in today telling us that he is going to have to go back to work. He will not be able to work with a wound VAC and he wants that discontinued. There was not much discussion. This is a financial issue for him. He also has erythema around the wound orifice which has satellite lesions around it this is no doubt cutaneous candidiasis 10/12; 2-week follow-up. Left greater trochanter. 7.2 cm of tunneling. 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 He has not been systemically unwell. 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 He is using silver alginate which he is dressing himself, ABDs with a large plastic occlusive dressing. Dimensions are somewhat better although this is an irregular wound. When he was here the last time he had a lot of drainage coming out of this wound. The culture I did showed methicillin sensitive staph aureus. I gave him 10 days of doxycycline and he is just finishing up on this. He states the drainage is a lot better 04/03/2020 upon evaluation today patient appears to be doing well with regard to his wound as far as the overall appearance is concerned the one issue that he is noting is that he does have some green drainage occurring at times. Obviously this brings up the concern for Pseudomonas I will see anything visually right now that has me overly concerned about at the same time that history does concern me. The patient would like to see if we can attempt to treat this today. I think that being that green drainage is pretty much pathognomonic for a Pseudomonas infection I think he can continue to doxycycline but probably should do a couple weeks of Cipro to try to see if that clears up the  drainage if not then we may need to delve into a more extensive culture possibly PCR at that point. With regard to the wound itself also believe based on talking with the patient today that he could be a candidate for a flap surgery that something he would like to consider READMISSION 07/04/2020 Mr. Pryer is a 76 year old man we had in clinic until the end of November 2021. He had a substantial wound on the left greater trochanter that required an original IandD by Dr. Marla Roe early in 2021. This was an infected wound and had IV antibiotics directed by infectious disease. He also had an L1-L2 discitis. We may have him in clinic here we initially put him in a wound VAC and he actually did quite well with this with filling in of the underlying tissue  however he had to go back to work. 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 He is still working there 40 hours a week second shift. He was coming to this clinic until late in November I am not exactly sure what happened. I had not referred him to plastic surgery because he would have to inevitably take time off work that he was not prepared to do. The orifice of the wound was too small to really reconsider a wound VAC and that still true. He arrives back in clinic with a direct depth of the wound at 1.5 cm that tunnel goes to 3.1 cm from 3.6 last time. He saw Dr. Johnnye Sima on 04/14/2020. He ordered a follow-up MRI. This showed persistence of a peritrochanteric fluid collection in the bursa which was slightly larger than the previous image. This communicated with the wounds. The patient still says he is having clear fluid drainage and he is changing his dressing twice a day. He is currently using predominantly half-strength Dakin's wet-to-dry and he correctly notes that the wound is clean and has come in significantly albeit slowly. He has not noticed any systemic symptoms. No real pain. Last measurements of his C-reactive protein  and sedimentation rate were 3.7 and 2 respectively. He was supposed to follow-up with Dr. Johnnye Sima I am not sure that he has. Dr. Johnnye Sima had tried to organize an appointment with Dr. Marla Roe I do not think that ever got done either. The patient tells me he is not willing to consider any further surgery to this area until he has short-term disability time which he thinks will happen sometime in May or June Electronic Signature(s) Signed: 07/04/2020 5:22:30 PM By: [name] MD Entered By: [name] on 07/04/2020 10:47:54 -------------------------------------------------------------------------------- Physical Exam Details Patient Name: Date of Service: Ivan [name], [name] 07/04/2020 9:00 A M Medical Record Number: [id number] Patient Account Number: [id number] Date of Birth/Sex: Treating RN: [birth year] (74 y.o. Male) Rhae Hammock Primary Care Provider: [name] Other Clinician: Referring Provider: Treating Provider/Extender: Cheree Ditto in Treatment: 0 Constitutional Sitting or standing Blood Pressure is within target range for patient.. Pulse regular and within target range for patient.Marland Kitchen Respirations regular, non-labored and within target range.. Temperature is normal and within the target range for the patient.Marland Kitchen Appears in no distress. Cardiovascular . Integumentary (Hair, Skin) Looks like some contact dermatitis from either a foam or an ABD he is putting around the wound but there is no evidence of soft tissue or subcutaneous infection. Notes Wound exam; the areas on the left greater trochanter. 1.5 cm of direct depth what I can see of the granulation looks healthy he is got roughly 3.1 cm of probing tunnel at roughly 12:00. There is no obvious exposed bone that I can feel no purulent drainage no soft tissue tenderness. Electronic Signature(s) Signed: 07/04/2020 5:22:30 PM By: [name] MD Entered By: [name] on 07/04/2020  10:49:20 -------------------------------------------------------------------------------- Physician Orders Details Patient Name: Date of Service: Ivan [name], [name] 07/04/2020 9:00 A M Medical Record Number: [id number] Patient Account Number: [id number] Date of Birth/Sex: Treating RN: 1945/05/22 (74 y.o. Male) Rhae Hammock Primary Care Provider: [name] Other Clinician: Referring Provider: Treating Provider/Extender: Cheree Ditto in Treatment: 0 Verbal / Phone Orders: No Diagnosis Coding Follow-up Appointments Return appointment in 1 month. Bathing/ Shower/ Hygiene May shower with protection but do not get wound dressing(s) wet. Off-Loading Turn and reposition every 2 hours Additional Orders / Instructions Other: -  We will refer you to Dr. Marla Roe in about a month per your request. Wound Treatment Wound #2 - Trochanter Wound Laterality: Left Cleanser: Wound Cleanser 2 x Per Day/15 Days Discharge Instructions: Cleanse the wound with wound cleanser prior to applying a clean dressing using gauze sponges, not tissue or cotton balls. Peri-Wound Care: Zinc Oxide Ointment 30g tube 2 x Per Day/15 Days Discharge Instructions: Apply Zinc Oxide to periwound with each dressing change Prim Dressing: Dakin's Solution 0.125%, 16 (oz) 2 x Per Day/15 Days ary Discharge Instructions: Moisten gauze with Dakin's solution Secondary Dressing: Woven Gauze Sponge, Non-Sterile 4x4 in 2 x Per Day/15 Days Discharge Instructions: Apply over primary dressing as directed. Secondary Dressing: ABD Pad, 5x9 2 x Per Day/15 Days Discharge Instructions: Apply over primary dressing as directed. Secured With: 81M Medipore H Soft Cloth Surgical T 4 x 2 (in/yd) 2 x Per Day/15 Days ape Discharge Instructions: Secure dressing with tape as directed. Electronic Signature(s) Signed: 07/04/2020 5:22:30 PM By: [name] MD Signed: 07/04/2020 5:43:17 PM By: Rhae Hammock RN Entered By:  Rhae Hammock on 07/04/2020 10:23:11 -------------------------------------------------------------------------------- Problem List Details Patient Name: Date of Service: Ivan [name], [name] 07/04/2020 9:00 A M Medical Record Number: [id number] Patient Account Number: [id number] Date of Birth/Sex: Treating RN: [birth year] (74 y.o. Male) Rhae Hammock Primary Care Provider: [name] Other Clinician: Referring Provider: Treating Provider/Extender: Cheree Ditto in Treatment: 0 Active Problems ICD-10 Encounter Code Description Active Date MDM Diagnosis L89.224 Pressure ulcer of left hip, stage 4 07/04/2020 No Yes Inactive Problems Resolved Problems Electronic Signature(s) Signed: 07/04/2020 5:22:30 PM By: [name] MD Entered By: [name] on 07/04/2020 10:25:50 -------------------------------------------------------------------------------- Progress Note Details Patient Name: Date of Service: Ivan [name], [name] 07/04/2020 9:00 A M Medical Record Number: [id number] Patient Account Number: [id number] Date of Birth/Sex: Treating RN: 02-02-1946 (75 y.o. 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 He required admission to hospital from 1/5 through 1/9 with Covid pneumonia. At that point he apparently left AMA. He was noted to have a left hip bruise at the time of discharge. I suspect this really was a deep tissue injury. He was readmitted  from 1/13 through 1//27 with altered mental status Covid pneumonia and delirium. At that point he had a left hip and thigh wound which had worsened. Felt to be a pressure injury. He was seen by general surgery who recommended normal saline wet-to-dry twice daily he did not have a debridement. After he left at that point he was incapacitated by increasing lumbar pain. An MRI on 2/9 showed discitis and osteomyelitis at L1/2. He asked aspiration of 1 cm fluid however I think this culture negative. He was also noted at this point to have a very large wound over the left greater trochanter. He was taken to the OR by Dr. Marla Roe for debridement with ACell placement. He was placed in a wound VAC. A culture at that point showed Enterococcus and Enterobacter. An MRI on 3/15 showed no evidence of left hip septic arthritis or osteomyelitis. Extensive subcutaneous edema within the visualized  soft tissue of the left thigh and prominent left inguinal lymph nodes likely reactive. He was started on IV cefepime and vancomycin for 6 weeks. He was discharged to [facility] facility as the patient. There I gather the wound developed the necrotic surface. He was seen by the wound care doctor in house who correctly pointed out that a wound VAC would not do anything with a necrotic surface. Since then he has been using Santyl The patient is now at home. He was on vancomycin and cefepime. He developed hives and a generalized rash and was seen in the ER on 3/15 by Dr. Cain Sieve recommendation he was changed to meropenem however he also apparently developed hives after this. He says the original hives stopped after the cefepime was stopped and follow-up hives only developed after the meropenem. He is only taken 1 dose of the fear of the meropenem and now is on vancomycin. 3/25; the patient was seen by Dr. Linus Salmons on 3/22. Because of intolerances to meropenem which I communicated with Dr. De Burrs about he is now on daptomycin and  Cipro until sometime in early April. He seems to be tolerating the antibiotics well. We are using Santyl back by wet-to-dry dressings. And making some improvement in the general surface of this very large wound 4/8; we are using Santyl back by wet-to-dry. We are making progress in terms of prepping the wound for a wound VAC. Still necrotic material requiring debridement. 4/16; we are using Santyl back by wet-to-dry. Not convinced that his wife is packing this properly there is a 6 cm tunnel at 12:00. We brought this to attention. Still requiring mechanical debridement 4/29; 2-week follow-up. In general the wound is better however there is considerable tunneling tunneling to the greater trochanter itself. There is still some debris in the deeper parts of this wound I did not do mechanical debridement on this today. I am going to change the dressing to quarter strength Dakin's wet-to-dry. We will put a wound VAC through his insurance. His wife has had brain damage from a previous meningioma surgery. She is not comfortable around strangers in her home. The patient is concerned about this 5/18; patient is using quarter strength Dakin's wet to dry. The problem is a deep pressure area over the left greater trochanter. There was some question from her intake nurses that this is actually come in and some question raised about whether he would require a wound VAC or not. The issue with the wound VAC was complicated predominantly by the patient's reluctance to allow people into his home i.e. home health although he tells me he has this mostly "sorted out" 7/1; this is a patient that I have not seen in almost 6 weeks. Apparently canceled several appointments related to family problems deaths in the family etc. He is wife has ophthalmologic surgery next week. I had been trying to get a wound VAC ordered for this deep probing wound over the left greater trochanter but so far I think he is simply doing a Dakin's  wet-to-dry and our intake nurse did not think there was any Dakin's on it. I have a note from Dr. Tommy Medal of infectious disease on 6/30. He has now been off doxycycline and ciprofloxacin for several weeks. He has no undue pain or drainage. He also had discitis and osteomyelitis at L1-L2 with a 1 cm left psoas abscess as well as some increased signal at T10/T11. He is not having any problems related to this. 12/16/2019 on evaluation today  patient's wound actually is showing some signs of bit of improvement which is good news. Fortunately there is no signs of active infection at this time which is good news. He tells me that the nurses have been coming to change this 3 times a week and doing a great job. He told the intake nurse that they had only been coming out once a week. I am not really sure what is going on with the discrepancy and stories here. Nonetheless I do believe that he needs to have this changed 3 times a week on a regular basis he does seem to have a little bit of a fungal infection around the wound. 8/19; 2-week follow-up. I have not seen this patient in over 6 weeks. There is been a dramatic improvement in the overall wound volume. Circumference of the wound is now small although there is still considerable undermining from roughly 9-2 o'clock maximum 1:00 at roughly 4.7 cm. There is no purulent drainage. He has some irritation around the wound on the normal skin. They are using miconazole on this. 9/2; 2-week follow-up. Dimensions of the wound circumference look quite good. There is tunneling at 4.3 cm at 1:00 versus 4.7 last time. The area continues to look like it is contracting with the wound VAC and I think the treatment of choice here will be continuing to the wound VAC as long as it seems practical. There is no evidence of infection. As far as I am aware he is offloading this aggressively 9/16; 2-week follow-up. Left greater trochanter. Dimensions about the same still about 4.9 cm  of maximal undermining. Nevertheless the visible tissue that I can see looks healthy. We have a wound VAC and really from the perspective of how this looks initially this is gotten a lot better however not much change in the last week 9/30 2-week follow-up. Left greater trochanter. He has a tunnel at 12:00 that goes about 7 cm. From 10-12 lesser degrees of undermining. HOWEVER he comes in today telling us that he is going to have to go back to work. He will not be able to work with a wound VAC and he wants that discontinued. There was not much discussion. This is a financial issue for him. He also has erythema around the wound orifice which has satellite lesions around it this is no doubt cutaneous candidiasis 10/12; 2-week follow-up. Left greater trochanter. 7.2 cm of tunneling. 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 He has not been systemically unwell. 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 He is using silver alginate which he is dressing himself, ABDs with a large plastic occlusive dressing. Dimensions are somewhat better although this is an irregular wound. When he was here the last time he had a lot of drainage coming out of this wound. The culture I did showed methicillin sensitive staph aureus. I gave him 10 days of doxycycline and he is just finishing up on this. He states the drainage is a lot better 04/03/2020 upon evaluation today patient appears to be doing well with regard to his wound as far as the overall appearance is concerned the one issue that he is noting is that he does have some green drainage occurring at times. Obviously this brings up the concern for Pseudomonas I will see anything visually right now that has me  overly concerned about at the same time that history does concern me. The patient would like to see if we can attempt  to treat this today. I think that being that green drainage is pretty much pathognomonic for a Pseudomonas infection I think he can continue to doxycycline but probably should do a couple weeks of Cipro to try to see if that clears up the drainage if not then we may need to delve into a more extensive culture possibly PCR at that point. With regard to the wound itself also believe based on talking with the patient today that he could be a candidate for a flap surgery that something he would like to consider READMISSION 07/04/2020 Mr. Statzer is a 75 year old man we had in clinic until the end of November 2021. He had a substantial wound on the left greater trochanter that required an original IandD by Dr. Marla Roe early in 2021. This was an infected wound and had IV antibiotics directed by infectious disease. He also had an L1-L2 discitis. We may have him in clinic here we initially put him in a wound VAC and he actually did quite well with this with filling in of the underlying tissue however he had to go back to work. 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 He is still working there 40 hours a week second shift. He was coming to this clinic until late in November I am not exactly sure what happened. I had not referred him to plastic surgery because he would have to inevitably take time off work that he was not prepared to do. The orifice of the wound was too small to really reconsider a wound VAC and that still true. He arrives back in clinic with a direct depth of the wound at 1.5 cm that tunnel goes to 3.1 cm from 3.6 last time. He saw Dr. Johnnye Sima on 04/14/2020. He ordered a follow-up MRI. This showed persistence of a peritrochanteric fluid collection in the bursa which was slightly larger than the previous image. This communicated with the wounds. The patient still says he is having clear fluid drainage and he is changing his dressing twice a day. He is currently  using predominantly half-strength Dakin's wet-to-dry and he correctly notes that the wound is clean and has come in significantly albeit slowly. He has not noticed any systemic symptoms. No real pain. Last measurements of his C-reactive protein and sedimentation rate were 3.7 and 2 respectively. He was supposed to follow-up with Dr. Johnnye Sima I am not sure that he has. Dr. Johnnye Sima had tried to organize an appointment with Dr. Marla Roe I do not think that ever got done either. The patient tells me he is not willing to consider any further surgery to this area until he has short-term disability time which he thinks will happen sometime in May or June Patient History Information obtained from Patient. Allergies Iodinated Contrast Media, cefepime, meropenem, vancomycin (Reaction: rash) Family History Cancer - Paternal Grandparents, Diabetes - Paternal Grandparents, Kidney Disease - Maternal Grandparents, No family history of Heart Disease, Hereditary Spherocytosis, Hypertension, Lung Disease, Seizures, Stroke, Thyroid Problems, Tuberculosis. Social History Former smoker - quit 5 years ago, Marital Status - Married, Alcohol Use - Rarely, Drug Use - No History, Caffeine Use - Daily. Medical History Eyes Patient has history of Cataracts Denies history of Glaucoma, Optic Neuritis Ear/Nose/Mouth/Throat Denies history of Chronic sinus problems/congestion, Middle ear problems Hematologic/Lymphatic Denies history of Anemia, Hemophilia, Human Immunodeficiency Virus, Lymphedema,  Sickle Cell Disease Respiratory Denies history of Aspiration, Asthma, Chronic Obstructive Pulmonary Disease (COPD), Pneumothorax, Sleep Apnea, Tuberculosis Cardiovascular Patient has history of Coronary Artery Disease, Hypertension, Myocardial Infarction Gastrointestinal Denies history of Cirrhosis , Colitis, Crohnoos, Hepatitis A, Hepatitis B, Hepatitis C Endocrine Denies history of Type I Diabetes, Type II  Diabetes Genitourinary Denies history of End Stage Renal Disease Immunological Denies history of Lupus Erythematosus, Raynaudoos, Scleroderma Integumentary (Skin) Denies history of History of Burn Musculoskeletal Patient has history of Osteoarthritis, Osteomyelitis Denies history of Gout, Rheumatoid Arthritis Neurologic Denies history of Dementia, Neuropathy, Quadriplegia, Paraplegia, Seizure Disorder Oncologic Denies history of Received Chemotherapy, Received Radiation Psychiatric Denies history of Anorexia/bulimia, Confinement Anxiety Hospitalization/Surgery History - appendectomy. - cardiac catheterization. - cataract extraction. - cervical fusion. - CABG. Medical A Surgical History Notes nd Endocrine testosterone deficiency Genitourinary BPH Oncologic skin cancer Review of Systems (ROS) Constitutional Symptoms (General Health) Denies complaints or symptoms of Fatigue, Fever, Chills, Marked Weight Change. Eyes Complains or has symptoms of Glasses / Contacts - reading. Denies complaints or symptoms of Dry Eyes, Vision Changes. Ear/Nose/Mouth/Throat Denies complaints or symptoms of Chronic sinus problems or rhinitis. Respiratory Denies complaints or symptoms of Chronic or frequent coughs, Shortness of Breath. Cardiovascular Denies complaints or symptoms of Chest pain. Gastrointestinal Denies complaints or symptoms of Frequent diarrhea, Nausea, Vomiting. Endocrine Denies complaints or symptoms of Heat/cold intolerance. Genitourinary Denies complaints or symptoms of Frequent urination. Integumentary (Skin) Complains or has symptoms of Wounds - left trochanter. Neurologic Denies complaints or symptoms of Numbness/parasthesias. Psychiatric Denies complaints or symptoms of Claustrophobia, Suicidal. Objective Constitutional Sitting or standing Blood Pressure is within target range for patient.. Pulse regular and within target range for patient.Marland Kitchen Respirations regular,  non-labored and within target range.. Temperature is normal and within the target range for the patient.Marland Kitchen Appears in no distress. Vitals Time Taken: 9:36 AM, Height: 70 in, Source: Stated, Weight: 209 lbs, Source: Stated, BMI: 30, Temperature: 98.6 F, Pulse: 76 bpm, Respiratory Rate: 18 breaths/min, Blood Pressure: 122/71 mmHg. General Notes: Wound exam; the areas on the left greater trochanter. 1.5 cm of direct depth what I can see of the granulation looks healthy he is got roughly 3.1 cm of probing tunnel at roughly 12:00. There is no obvious exposed bone that I can feel no purulent drainage no soft tissue tenderness. Integumentary (Hair, Skin) Looks like some contact dermatitis from either a foam or an ABD he is putting around the wound but there is no evidence of soft tissue or subcutaneous infection. Wound #2 status is Open. Original cause of wound was Pressure Injury. The date acquired was: 05/18/2019. The wound is located on the Left Trochanter. The wound measures 1.9cm length x 1cm width x 1.5cm depth; 1.492cm^2 area and 2.238cm^3 volume. There is Fat Layer (Subcutaneous Tissue) exposed. There is no undermining noted, however, there is tunneling at 12:00 with a maximum distance of 3.1cm. There is a medium amount of serous drainage noted. The wound margin is epibole. There is large (67-100%) red granulation within the wound bed. There is no necrotic tissue within the wound bed. Assessment Active Problems ICD-10 Pressure ulcer of left hip, stage 4 Plan Follow-up Appointments: Return appointment in 1 month. Bathing/ Shower/ Hygiene: May shower with protection but do not get wound dressing(s) wet. Off-Loading: Turn and reposition every 2 hours Additional Orders / Instructions: Other: - We will refer you to Dr. Marla Roe in about a month per your request. WOUND #2: - Trochanter Wound Laterality: Left Cleanser: Wound Cleanser 2 x Per  Day/15 Days Discharge Instructions: Cleanse the  wound with wound cleanser prior to applying a clean dressing using gauze sponges, not tissue or cotton balls. Peri-Wound Care: Zinc Oxide Ointment 30g tube 2 x Per Day/15 Days Discharge Instructions: Apply Zinc Oxide to periwound with each dressing change Prim Dressing: Dakin's Solution 0.125%, 16 (oz) 2 x Per Day/15 Days ary Discharge Instructions: Moisten gauze with Dakin's solution Secondary Dressing: Woven Gauze Sponge, Non-Sterile 4x4 in 2 x Per Day/15 Days Discharge Instructions: Apply over primary dressing as directed. Secondary Dressing: ABD Pad, 5x9 2 x Per Day/15 Days Discharge Instructions: Apply over primary dressing as directed. Secured With: 23M Medipore H Soft Cloth Surgical T 4 x 2 (in/yd) 2 x Per Day/15 Days ape Discharge Instructions: Secure dressing with tape as directed. 1. The patient likes the Dakin's wet-to-dry dressing that he is changing twice daily. He makes the Dakin's himself which is relatively cheap to do. I certainly could think of some alternatives it would not nearly be as inexpensive. 2. I think the orifice of this wound is too small to consider a wound VAC and in any case he would not be able to work with this 3. He is prepared to go talk to plastic surgery about a myocutaneous flap however he wants to have short-term disability/PTO for the inevitable recovery. Afterwards. He thinks he will have this in May or June and I will talk to him about this again in a month thing either refer him to Dr. Marla Roe or [facility] whomever he wants to see 4. There may be a connection with the underlying bursa which has a fluid collection and that probably accounts for some of the drainage. There does not appear to be infection here. I did not culture or prescribe antibiotics. I wonder whether the bursts fluid collection would have to be addressed before or in conjunction with any flap surgery. Electronic Signature(s) Signed: 07/04/2020 5:22:30 PM By: [name]  MD Entered By: [name] on 07/04/2020 10:51:34 -------------------------------------------------------------------------------- [facility] Details Patient Name: Date of Service: Ivan [name], [name] 07/04/2020 9:00 A M Medical Record Number: [id number] Patient Account Number: [id number] Date of Birth/Sex: Treating RN: [birth year] (74 y.o. Male) Baruch Gouty Primary Care Provider: [name] Other Clinician: Referring Provider: Treating Provider/Extender: Cheree Ditto in Treatment: 0 Information Obtained From Patient Constitutional Symptoms (General Health) Complaints and Symptoms: Negative for: Fatigue; Fever; Chills; Marked Weight Change Eyes Complaints and Symptoms: Positive for: Glasses / Contacts - reading Negative for: Dry Eyes; Vision Changes Medical History: Positive for: Cataracts Negative for: Glaucoma; Optic Neuritis Ear/Nose/Mouth/Throat Complaints and Symptoms: Negative for: Chronic sinus problems or rhinitis Medical History: Negative for: Chronic sinus problems/congestion; Middle ear problems Respiratory Complaints and Symptoms: Negative for: Chronic or frequent coughs; Shortness of Breath Medical History: Negative for: Aspiration; Asthma; Chronic Obstructive Pulmonary Disease (COPD); Pneumothorax; Sleep Apnea; Tuberculosis Cardiovascular Complaints and Symptoms: Negative for: Chest pain Medical History: Positive for: Coronary Artery Disease; Hypertension; Myocardial Infarction Gastrointestinal Complaints and Symptoms: Negative for: Frequent diarrhea; Nausea; Vomiting Medical History: Negative for: Cirrhosis ; Colitis; Crohns; Hepatitis A; Hepatitis B; Hepatitis C Endocrine Complaints and Symptoms: Negative for: Heat/cold intolerance Medical History: Negative for: Type I Diabetes; Type II Diabetes Past Medical History Notes: testosterone deficiency Genitourinary Complaints and Symptoms: Negative for: Frequent urination Medical  History: Negative for: End Stage Renal Disease Past Medical History Notes: BPH Integumentary (Skin) Complaints and Symptoms: Positive for: Wounds - left trochanter Medical History: Negative for: History of Burn Neurologic Complaints and Symptoms: Negative  for: Numbness/parasthesias Medical History: Negative for: Dementia; Neuropathy; Quadriplegia; Paraplegia; Seizure Disorder Psychiatric Complaints and Symptoms: Negative for: Claustrophobia; Suicidal Medical History: Negative for: Anorexia/bulimia; Confinement Anxiety Hematologic/Lymphatic Medical History: Negative for: Anemia; Hemophilia; Human Immunodeficiency Virus; Lymphedema; Sickle Cell Disease Immunological Medical History: Negative for: Lupus Erythematosus; Raynauds; Scleroderma Musculoskeletal Medical History: Positive for: Osteoarthritis; Osteomyelitis Negative for: Gout; Rheumatoid Arthritis Oncologic Medical History: Negative for: Received Chemotherapy; Received Radiation Past Medical History Notes: skin cancer HBO Extended History Items Eyes: Cataracts Immunizations Pneumococcal Vaccine: Received Pneumococcal Vaccination: Yes Implantable Devices None Hospitalization / Surgery History Type of Hospitalization/Surgery appendectomy cardiac catheterization cataract extraction cervical fusion CABG Family and Social History Cancer: Yes - Paternal Grandparents; Diabetes: Yes - Paternal Grandparents; Heart Disease: No; Hereditary Spherocytosis: No; Hypertension: No; Kidney Disease: Yes - Maternal Grandparents; Lung Disease: No; Seizures: No; Stroke: No; Thyroid Problems: No; Tuberculosis: No; Former smoker - quit 5 years ago; Marital Status - Married; Alcohol Use: Rarely; Drug Use: No History; Caffeine Use: Daily; Financial Concerns: No; Food, Clothing or Shelter Needs: No; Support System Lacking: No; Transportation Concerns: No Engineer, maintenance) Signed: 07/04/2020 5:22:30 PM By: [name]  MD Signed: 07/04/2020 5:38:13 PM By: Baruch Gouty RN, BSN Entered By: Baruch Gouty on 07/04/2020 09:41:10 -------------------------------------------------------------------------------- [place] Details Patient Name: Date of Service: Ivan [name], [name] 07/04/2020 Medical Record Number: [id number] Patient Account Number: [id number] Date of Birth/Sex: Treating RN: 31-Dec-1945 (74 y.o. Male) Rhae Hammock Primary Care Provider: [name] Other Clinician: Referring Provider: Treating Provider/Extender: Cheree Ditto in Treatment: 0 Diagnosis Coding ICD-10 Codes Code Description [phone] Pressure ulcer of left hip, stage 4 Facility Procedures CPT4 Code: [id number] Description: [phone] - WOUND CARE VISIT-LEV 4 NEW PT Modifier: Quantity: 1 Physician Procedures : CPT4 Code Description Modifier [id number] - WC PHYS LEVEL 4 - EST PT ICD-10 Diagnosis Description L89.224 Pressure ulcer of left hip, stage 4 Quantity: 1 Electronic Signature(s) Signed: 07/04/2020 5:22:30 PM By: [name] MD Entered By: [name] on 07/04/2020 10:52:02

## 2020-07-04 NOTE — Progress Notes (Signed)
BRAY, VICKERMAN ([id number]) Visit Report for 07/04/2020 Allergy List Details Patient Name: Date of Service: Ivan [name]. 07/04/2020 9:00 A M Medical Record Number: [id number] Patient Account Number: [id number] Date of Birth/Sex: Treating RN: 12-12-1945 (75 y.o. Male) Baruch Gouty Primary Care Provider: [name] Other Clinician: Referring Provider: Treating Provider/Extender: Cheree Ditto in Treatment: 0 Allergies Active Allergies Iodinated Contrast Media cefepime meropenem vancomycin Reaction: rash Allergy Notes Electronic Signature(s) Signed: 07/04/2020 5:38:13 PM By: Baruch Gouty RN, BSN Entered By: Baruch Gouty on 07/04/2020 09:37:50 -------------------------------------------------------------------------------- Arrival Information Details Patient Name: Date of Service: Ivan [name], [name] 07/04/2020 9:00 A M Medical Record Number: [id number] Patient Account Number: [id number] Date of Birth/Sex: Treating RN: 17-Aug-1945 (75 y.o. Male) Baruch Gouty Primary Care Provider: [name] Other Clinician: Referring Provider: Treating Provider/Extender: Cheree Ditto in Treatment: 0 Visit Information Patient Arrived: Ambulatory Arrival Time: 09:29 Accompanied By: self Transfer Assistance: None Patient Identification Verified: Yes Secondary Verification Process Completed: Yes Patient Requires Transmission-Based Precautions: No Patient Has Alerts: No History Since Last Visit Has Dressing in Place as Prescribed: Yes Electronic Signature(s) Signed: 07/04/2020 5:38:13 PM By: Baruch Gouty RN, BSN Entered By: Baruch Gouty on 07/04/2020 09:35:34 -------------------------------------------------------------------------------- Clinic Level of Care Assessment Details Patient Name: Date of Service: Ivan [name], [name] 07/04/2020 9:00 A M Medical Record Number: [id number] Patient Account Number: [id number] Date of Birth/Sex: Treating  RN: May 06, 1946 (75 y.o. Male) Rhae [name] Primary Care Provider: [name] Other Clinician: Referring Provider: Treating Provider/Extender: Cheree Ditto in Treatment: 0 Clinic Level of Care Assessment Items TOOL 2 Quantity Score X- 1 0 Use when only an EandM is performed on the INITIAL visit ASSESSMENTS - Nursing Assessment / Reassessment X- 1 20 General Physical Exam (combine w/ comprehensive assessment (listed just below) when performed on new pt. evals) X- 1 25 Comprehensive Assessment (HX, ROS, Risk Assessments, Wounds Hx, etc.) ASSESSMENTS - Wound and Skin A ssessment / Reassessment X - Simple Wound Assessment / Reassessment - one wound 1 5 []  - 0 Complex Wound Assessment / Reassessment - multiple wounds X- 1 10 Dermatologic / Skin Assessment (not related to wound area) ASSESSMENTS - Ostomy and/or Continence Assessment and Care []  - 0 Incontinence Assessment and Management []  - 0 Ostomy Care Assessment and Management (repouching, etc.) PROCESS - Coordination of Care X - Simple Patient / Family Education for ongoing care 1 15 []  - 0 Complex (extensive) Patient / Family Education for ongoing care X- 1 10 Staff obtains Programmer, systems, Records, T Results / Process Orders est []  - 0 Staff telephones HHA, Nursing Homes / Clarify orders / etc []  - 0 Routine Transfer to another Facility (non-emergent condition) []  - 0 Routine Hospital Admission (non-emergent condition) X- 1 15 New Admissions / Biomedical engineer / Ordering NPWT Apligraf, etc. , []  - 0 Emergency Hospital Admission (emergent condition) X- 1 10 Simple Discharge Coordination []  - 0 Complex (extensive) Discharge Coordination PROCESS - Special Needs []  - 0 Pediatric / Minor Patient Management []  - 0 Isolation Patient Management []  - 0 Hearing / Language / Visual special needs []  - 0 Assessment of Community assistance (transportation, D/C planning, etc.) []  - 0 Additional assistance  / Altered mentation []  - 0 Support Surface(s) Assessment (bed, cushion, seat, etc.) INTERVENTIONS - Wound Cleansing / Measurement X- 1 5 Wound Imaging (photographs - any number of wounds) []  - 0 Wound Tracing (instead of photographs) X- 1 5 Simple Wound Measurement - one wound []  - 0 Complex Wound  Measurement - multiple wounds X- 1 5 Simple Wound Cleansing - one wound []  - 0 Complex Wound Cleansing - multiple wounds INTERVENTIONS - Wound Dressings X - Small Wound Dressing one or multiple wounds 1 10 []  - 0 Medium Wound Dressing one or multiple wounds []  - 0 Large Wound Dressing one or multiple wounds []  - 0 Application of Medications - injection INTERVENTIONS - Miscellaneous []  - 0 External ear exam []  - 0 Specimen Collection (cultures, biopsies, blood, body fluids, etc.) []  - 0 Specimen(s) / Culture(s) sent or taken to Lab for analysis []  - 0 Patient Transfer (multiple staff / Harrel Lemon Lift / Similar devices) []  - 0 Simple Staple / Suture removal (25 or less) []  - 0 Complex Staple / Suture removal (26 or more) []  - 0 Hypo / Hyperglycemic Management (close monitor of Blood Glucose) []  - 0 Ankle / Brachial Index (ABI) - do not check if billed separately Has the patient been seen at the hospital within the last three years: Yes Total Score: 135 Level Of Care: New/Established - Level 4 Electronic Signature(s) Signed: 07/04/2020 5:43:17 PM By: Rhae Hammock RN Entered By: Rhae [name] on 07/04/2020 10:19:23 -------------------------------------------------------------------------------- Encounter Discharge Information Details Patient Name: Date of Service: Ivan [name], [name] 07/04/2020 9:00 A M Medical Record Number: [id number] Patient Account Number: [id number] Date of Birth/Sex: Treating RN:  28, 1947 (75 y.o. Male) Ivan [name] Primary Care Provider: [name] Other Clinician: Referring Provider: Treating Provider/Extender: Cheree Ditto in  Treatment: 0 Encounter Discharge Information Items Discharge Condition: Stable Ambulatory Status: Ambulatory Discharge Destination: Home Transportation: Private Auto Accompanied By: alone Schedule Follow-up Appointment: Yes Clinical Summary of Care: Patient Declined Electronic Signature(s) Signed: 07/04/2020 5:33:10 PM By: Ivan Hurst RN, BSN Entered By: Ivan [name] on 07/04/2020 10:43:35 -------------------------------------------------------------------------------- Lower Extremity Assessment Details Patient Name: Date of Service: Ivan [name], [name] 07/04/2020 9:00 A M Medical Record Number: [id number] Patient Account Number: [id number] Date of Birth/Sex: Treating RN: 09/21/1945 (75 y.o. Male) Baruch Gouty Primary Care Provider: [name] Other Clinician: Referring Provider: Treating Provider/Extender: Cheree Ditto in Treatment: 0 Electronic Signature(s) Signed: 07/04/2020 5:38:13 PM By: Baruch Gouty RN, BSN Entered By: Baruch Gouty on 07/04/2020 09:43:41 -------------------------------------------------------------------------------- Multi Wound Chart Details Patient Name: Date of Service: Ivan [name], [name] 07/04/2020 9:00 A M Medical Record Number: [id number] Patient Account Number: [id number] Date of Birth/Sex: Treating RN: 1945/12/28 (75 y.o. Male) Rhae [name] Primary Care Provider: [name] Other Clinician: Referring Provider: Treating Provider/Extender: Cheree Ditto in Treatment: 0 Vital Signs Height(in): 70 Pulse(bpm): 76 Weight(lbs): 209 Blood Pressure(mmHg): 122/71 Body Mass Index(BMI): 30 Temperature(F): 98.6 Respiratory Rate(breaths/min): 18 Photos: [2:No Photos Left Trochanter] [N/A:N/A N/A] Wound Location: [2:Pressure Injury] [N/A:N/A] Wounding Event: [2:Pressure Ulcer] [N/A:N/A] Primary Etiology: [2:Cataracts, Coronary Artery Disease, N/A] Comorbid History: [2:Hypertension, Myocardial Infarction,  Osteoarthritis, Osteomyelitis 05/18/2019] [N/A:N/A] Date Acquired: [2:0] [N/A:N/A] Weeks of Treatment: [2:Open] [N/A:N/A] Wound Status: [2:1.9x1x1.5] [N/A:N/A] Measurements L x W x D (cm) [2:1.492] [N/A:N/A] A (cm) : rea [2:2.238] [N/A:N/A] Volume (cm) : [2:12] Position 1 (o'clock): [2:3.1] Maximum Distance 1 (cm): [2:Yes] [N/A:N/A] Tunneling: [2:Category/Stage IV] [N/A:N/A] Classification: [2:Medium] [N/A:N/A] Exudate A mount: [2:Serous] [N/A:N/A] Exudate Type: [2:amber] [N/A:N/A] Exudate Color: [2:Epibole] [N/A:N/A] Wound Margin: [2:Large (67-100%)] [N/A:N/A] Granulation A mount: [2:Red] [N/A:N/A] Granulation Quality: [2:None Present (0%)] [N/A:N/A] Necrotic A mount: [2:Fat Layer (Subcutaneous Tissue): Yes N/A] Exposed Structures: [2:Fascia: No Tendon: No Muscle: No Joint: No Bone: No None] [N/A:N/A] Treatment Notes Electronic Signature(s) Signed: 07/04/2020 5:22:30 PM By: [name] MD Signed:  07/04/2020 5:43:17 PM By: Rhae Hammock RN Entered By: [name] on 07/04/2020 10:42:25 -------------------------------------------------------------------------------- [place] Details Patient Name: Date of Service: Ivan [name], Larina Earthly. 07/04/2020 9:00 A M Medical Record Number: [id number] Patient Account Number: [id number] Date of Birth/Sex: Treating RN: 1945/07/28 (75 y.o. Male) Rhae [name] Primary Care Provider: [name] Other Clinician: Referring Provider: Treating Provider/Extender: Cheree Ditto in Treatment: 0 Active Inactive Orientation to the Wound Care Program Nursing Diagnoses: Knowledge deficit related to the wound healing center program Goals: Patient/caregiver will verbalize understanding of the [place] Program Date Initiated: 07/04/2020 Target Resolution Date: 07/17/2020 Goal Status: Active Interventions: Provide education on orientation to the wound center Notes: Wound/Skin Impairment Nursing  Diagnoses: Impaired tissue integrity Knowledge deficit related to ulceration/compromised skin integrity Goals: Patient will have a decrease in wound volume by X% from date: (specify in notes) Date Initiated: 07/04/2020 Target Resolution Date: 07/21/2020 Goal Status: Active Patient/caregiver will verbalize understanding of skin care regimen Date Initiated: 07/04/2020 Target Resolution Date: 07/21/2020 Goal Status: Active Ulcer/skin breakdown will have a volume reduction of 30% by week 4 Date Initiated: 07/04/2020 Target Resolution Date: 07/21/2020 Goal Status: Active Interventions: Assess patient/caregiver ability to obtain necessary supplies Assess patient/caregiver ability to perform ulcer/skin care regimen upon admission and as needed Assess ulceration(s) every visit Provide education on ulcer and skin care Notes: Electronic Signature(s) Signed: 07/04/2020 5:43:17 PM By: Rhae Hammock RN Entered By: Rhae [name] on 07/04/2020 10:18:17 -------------------------------------------------------------------------------- Pain Assessment Details Patient Name: Date of Service: Ivan [name], [name] 07/04/2020 9:00 A M Medical Record Number: [id number] Patient Account Number: [id number] Date of Birth/Sex: Treating RN: [birth year] (75 y.o. Male) Baruch Gouty Primary Care Provider: [name] Other Clinician: Referring Provider: Treating Provider/Extender: Cheree Ditto in Treatment: 0 Active Problems Location of Pain Severity and Description of Pain Patient Has Paino No Site Locations Rate the pain. Current Pain Level: 0 Pain Management and Medication Current Pain Management: Electronic Signature(s) Signed: 07/04/2020 5:38:13 PM By: Baruch Gouty RN, BSN Entered By: Baruch Gouty on 07/04/2020 09:53:00 -------------------------------------------------------------------------------- Patient/Caregiver Education Details Patient Name: Date of Service: [place] M,  [name] 2/22/2022andnbsp9:00 A M Medical Record Number: [id number] Patient Account Number: [id number] Date of Birth/Gender: Treating RN: [birth year] (75 y.o. Male) Rhae [name] Primary Care Physician: [name] Other Clinician: Referring Physician: Treating Physician/Extender: Cheree Ditto in Treatment: 0 Education Assessment Education Provided To: Patient Education Topics Provided Welcome T [facility] [place]: o Methods: Explain/Verbal Responses: State content correctly Electronic Signature(s) Signed: 07/04/2020 5:43:17 PM By: Rhae Hammock RN Entered By: Rhae [name] on 07/04/2020 10:18:29 -------------------------------------------------------------------------------- Wound Assessment Details Patient Name: Date of Service: Ivan [name], [name] 07/04/2020 9:00 A M Medical Record Number: [id number] Patient Account Number: [id number] Date of Birth/Sex: Treating RN: 12/04/1945 (75 y.o. Male) Baruch Gouty Primary Care Provider: [name] Other Clinician: Referring Provider: Treating Provider/Extender: Cheree Ditto in Treatment: 0 Wound Status Wound Number: 2 Primary Pressure Ulcer Etiology: Wound Location: Left Trochanter Wound Open Wounding Event: Pressure Injury Status: Date Acquired: 05/18/2019 Comorbid Cataracts, Coronary Artery Disease, Hypertension, Myocardial Weeks Of Treatment: 0 History: Infarction, Osteoarthritis, Osteomyelitis Clustered Wound: No Wound Measurements Length: (cm) 1.9 Width: (cm) 1 Depth: (cm) 1.5 Area: (cm) 1.492 Volume: (cm) 2.238 % Reduction in Area: % Reduction in Volume: Epithelialization: None Tunneling: Yes Position (o'clock): 12 Maximum Distance: (cm) 3.1 Undermining: No Wound Description Classification: Category/Stage IV Wound Margin: Epibole Exudate Amount: Medium Exudate Type: Serous Exudate Color: amber Foul Odor After Cleansing: No  Wound Bed Granulation Amount: Large  (67-100%) Exposed Structure Granulation Quality: Red Fascia Exposed: No Necrotic Amount: None Present (0%) Fat Layer (Subcutaneous Tissue) Exposed: Yes Tendon Exposed: No Muscle Exposed: No Joint Exposed: No Bone Exposed: No Treatment Notes Wound #2 (Trochanter) Wound Laterality: Left Cleanser Wound Cleanser Discharge Instruction: Cleanse the wound with wound cleanser prior to applying a clean dressing using gauze sponges, not tissue or cotton balls. Peri-Wound Care Zinc Oxide Ointment 30g tube Discharge Instruction: Apply Zinc Oxide to periwound with each dressing change Topical Primary Dressing Dakin's Solution 0.125%, 16 (oz) Discharge Instruction: Moisten gauze with Dakin's solution Secondary Dressing Woven Gauze Sponge, Non-Sterile 4x4 in Discharge Instruction: Apply over primary dressing as directed. ABD Pad, 5x9 Discharge Instruction: Apply over primary dressing as directed. Secured With 50M Medipore H Soft Cloth Surgical T 4 x 2 (in/yd) ape Discharge Instruction: Secure dressing with tape as directed. Compression Wrap Compression Stockings Add-Ons Electronic Signature(s) Signed: 07/04/2020 5:38:13 PM By: Baruch Gouty RN, BSN Entered By: Baruch Gouty on 07/04/2020 09:51:29 -------------------------------------------------------------------------------- Vitals Details Patient Name: Date of Service: Ivan [name], [name] 07/04/2020 9:00 A M Medical Record Number: [id number] Patient Account Number: [id number] Date of Birth/Sex: Treating RN: 11/22/1945 (75 y.o. Male) Baruch Gouty Primary Care Provider: [name] Other Clinician: Referring Provider: Treating Provider/Extender: Cheree Ditto in Treatment: 0 Vital Signs Time Taken: 09:36 Temperature (F): 98.6 Height (in): 70 Pulse (bpm): 76 Source: Stated Respiratory Rate (breaths/min): 18 Weight (lbs): 209 Blood Pressure (mmHg): 122/71 Source: Stated Reference Range: 80 - 120 mg / dl Body  Mass Index (BMI): 30 Electronic Signature(s) Signed: 07/04/2020 5:38:13 PM By: Baruch Gouty RN, BSN Entered By: Baruch Gouty on 07/04/2020 09:37:17

## 2020-07-04 NOTE — Progress Notes (Signed)
LORENA, CLEARMAN ([id number]) Visit Report for 07/04/2020 Abuse/Suicide Risk Screen Details Patient Name: Date of Service: Ivan [name]. 07/04/2020 9:00 A M Medical Record Number: [id number] Patient Account Number: [id number] Date of Birth/Sex: Treating RN: 1945/10/04 (74 y.o. Male) Baruch Gouty Primary Care Nyiah Pianka: [name] Other Clinician: Referring Adaja Wander: Treating Valissa Lyvers/Extender: Cheree Ditto in Treatment: 0 Abuse/Suicide Risk Screen Items Answer ABUSE RISK SCREEN: Has anyone close to you tried to hurt or harm you recentlyo No Do you feel uncomfortable with anyone in your familyo No Has anyone forced you do things that you didnt want to doo No Electronic Signature(s) Signed: 07/04/2020 5:38:13 PM By: Baruch Gouty RN, BSN Entered By: Baruch Gouty on 07/04/2020 09:41:20 -------------------------------------------------------------------------------- Activities of Daily Living Details Patient Name: Date of Service: Ivan [name], Ivan BERT W. 07/04/2020 9:00 A M Medical Record Number: [id number] Patient Account Number: [id number] Date of Birth/Sex: Treating RN: 11/08/1945 (74 y.o. Male) Baruch Gouty Primary Care Tucker Minter: [name] Other Clinician: Referring Maitland Lesiak: Treating Forestine Macho/Extender: Cheree Ditto in Treatment: 0 Activities of Daily Living Items Answer Activities of Daily Living (Please select one for each item) Drive Automobile Completely Able T Medications ake Completely Able Use T elephone Completely Able Care for Appearance Completely Able Use T oilet Completely Able Bath / Shower Completely Able Dress Self Completely Able Feed Self Completely Able Walk Completely Able Get In / Out Bed Completely Able Housework Completely Able Prepare Meals Completely [place] for Self Completely Able Electronic Signature(s) Signed: 07/04/2020 5:38:13 PM By: Baruch Gouty RN, BSN Entered By:  Baruch Gouty on 07/04/2020 09:41:44 -------------------------------------------------------------------------------- Education Screening Details Patient Name: Date of Service: Ivan [name], Ivan BERT W. 07/04/2020 9:00 A M Medical Record Number: [id number] Patient Account Number: [id number] Date of Birth/Sex: Treating RN: 1945-08-01 (74 y.o. Male) Baruch Gouty Primary Care Modesty Rudy: [name] Other Clinician: Referring Baneza Bartoszek: Treating Sharda Keddy/Extender: Cheree Ditto in Treatment: 0 Primary Learner Assessed: Patient Learning Preferences/Education Level/Primary Language Learning Preference: Explanation, Demonstration, Printed Material Highest Education Level: College or Above Preferred Language: English Cognitive Barrier Language Barrier: No Translator Needed: No Memory Deficit: No Emotional Barrier: No Cultural/Religious Beliefs Affecting Medical Care: No Physical Barrier Impaired Vision: Yes Glasses Impaired Hearing: No Decreased Hand dexterity: No Knowledge/Comprehension Knowledge Level: High Comprehension Level: High Ability to understand written instructions: High Ability to understand verbal instructions: High Motivation Anxiety Level: Calm Cooperation: Cooperative Education Importance: Acknowledges Need Interest in Health Problems: Asks Questions Perception: Coherent Willingness to Engage in Self-Management High Activities: Readiness to Engage in Self-Management High Activities: Electronic Signature(s) Signed: 07/04/2020 5:38:13 PM By: Baruch Gouty RN, BSN Entered By: Baruch Gouty on 07/04/2020 09:42:07 -------------------------------------------------------------------------------- Fall Risk Assessment Details Patient Name: Date of Service: Ivan [name], Ivan BERT W. 07/04/2020 9:00 A M Medical Record Number: [id number] Patient Account Number: [id number] Date of Birth/Sex: Treating RN: Sep 23, 1945 (74 y.o. Male) Baruch Gouty Primary Care  Shanece Cochrane: [name] Other Clinician: Referring Bentley Haralson: Treating Yavier Snider/Extender: Cheree Ditto in Treatment: 0 Fall Risk Assessment Items Have you had 2 or more falls in the last 12 monthso 0 No Have you had any fall that resulted in injury in the last 12 monthso 0 No FALLS RISK SCREEN History of falling - immediate or within 3 months 0 No Secondary diagnosis (Do you have 2 or more medical diagnoseso) 0 No Ambulatory aid None/bed rest/wheelchair/nurse 0 Yes Crutches/cane/walker 0 No Furniture 0 No Intravenous therapy Access/Saline/Heparin Lock 0 No Gait/Transferring Normal/ bed rest/ wheelchair 0 Yes Weak (short  steps with or without shuffle, stooped but able to lift head while walking, may seek 0 No support from furniture) Impaired (short steps with shuffle, may have difficulty arising from chair, head down, impaired 0 No balance) Mental Status Oriented to own ability 0 Yes Electronic Signature(s) Signed: 07/04/2020 5:38:13 PM By: Baruch Gouty RN, BSN Entered By: Baruch Gouty on 07/04/2020 09:43:10 -------------------------------------------------------------------------------- Foot Assessment Details Patient Name: Date of Service: Ivan [name], Ivan BERT W. 07/04/2020 9:00 A M Medical Record Number: [id number] Patient Account Number: [id number] Date of Birth/Sex: Treating RN: 1945/10/31 (74 y.o. Male) Baruch Gouty Primary Care Kadon Andrus: [name] Other Clinician: Referring Hendrik Donath: Treating Trini Christiansen/Extender: Cheree Ditto in Treatment: 0 Foot Assessment Items Site Locations + = Sensation present, - = Sensation absent, C = Callus, U = Ulcer R = Redness, W = Warmth, M = Maceration, PU = Pre-ulcerative lesion F = Fissure, S = Swelling, D = Dryness Assessment Right: Left: Other Deformity: No No Prior Foot Ulcer: No No Prior Amputation: No No Charcot Joint: No No Ambulatory Status: Ambulatory Without Help Gait: Steady Electronic  Signature(s) Signed: 07/04/2020 5:38:13 PM By: Baruch Gouty RN, BSN Entered By: Baruch Gouty on 07/04/2020 09:43:33 -------------------------------------------------------------------------------- Nutrition Risk Screening Details Patient Name: Date of Service: Ivan [name], Ivan BERT W. 07/04/2020 9:00 A M Medical Record Number: [id number] Patient Account Number: [id number] Date of Birth/Sex: Treating RN: [birth year] (74 y.o. Male) Baruch Gouty Primary Care Stephen Baruch: [name] Other Clinician: Referring Estelita Iten: Treating Renly Roots/Extender: Cheree Ditto in Treatment: 0 Height (in): 70 Weight (lbs): 209 Body Mass Index (BMI): 30 Nutrition Risk Screening Items Score Screening NUTRITION RISK SCREEN: I have an illness or condition that made me change the kind and/or amount of food I eat 0 No I eat fewer than two meals per day 0 No I eat few fruits and vegetables, or milk products 0 No I have three or more drinks of beer, liquor or wine almost every day 0 No I have tooth or mouth problems that make it hard for me to eat 0 No I don't always have enough money to buy the food I need 0 No I eat alone most of the time 0 No I take three or more different prescribed or over-the-counter drugs a day 1 Yes Without wanting to, I have lost or gained 10 pounds in the last six months 0 No I am not always physically able to shop, cook and/or feed myself 0 No Nutrition Protocols Good Risk Protocol 0 No interventions needed Moderate Risk Protocol High Risk Proctocol Risk Level: Good Risk Score: 1 Electronic Signature(s) Signed: 07/04/2020 5:38:13 PM By: Baruch Gouty RN, BSN Entered By: Baruch Gouty on 07/04/2020 09:43:25

## 2020-07-17 ENCOUNTER — Telehealth: Payer: Self-pay | Admitting: Family Medicine

## 2020-07-17 NOTE — Telephone Encounter (Signed)
Patient undergoing bladder procedure. Nursing- Please call and  schedule for preoperative visit with me or Dr. Posey Pronto (has seen her previously). Form in bottom of my box.  [name], MD  Family Medicine Teaching Service

## 2020-07-18 ENCOUNTER — Encounter (HOSPITAL_BASED_OUTPATIENT_CLINIC_OR_DEPARTMENT_OTHER): Payer: Medicare HMO | Admitting: Internal Medicine

## 2020-07-19 ENCOUNTER — Encounter: Payer: Self-pay | Admitting: Family Medicine

## 2020-07-19 ENCOUNTER — Ambulatory Visit (INDEPENDENT_AMBULATORY_CARE_PROVIDER_SITE_OTHER): Payer: Medicare HMO | Admitting: Family Medicine

## 2020-07-19 ENCOUNTER — Other Ambulatory Visit: Payer: Self-pay

## 2020-07-19 VITALS — BP 112/58 | HR 72 | Ht 70.0 in | Wt 207.4 lb

## 2020-07-19 DIAGNOSIS — T783XXD Angioneurotic edema, subsequent encounter: Secondary | ICD-10-CM

## 2020-07-19 DIAGNOSIS — G629 Polyneuropathy, unspecified: Secondary | ICD-10-CM | POA: Diagnosis not present

## 2020-07-19 DIAGNOSIS — Z01818 Encounter for other preprocedural examination: Secondary | ICD-10-CM | POA: Diagnosis not present

## 2020-07-19 NOTE — Patient Instructions (Addendum)
It was wonderful to see you today.  Please bring ALL of your medications with you to every visit.   Today we talked about:  --I recommend not restarting testosterone prior to procedure.  --Please give Cardiology a call about your aspirin--we recommend continuing if possible through the procedure  - Hold your gabapentin and Statin on the day of the procedure  Take your carvedilol with a small sip of water as usual on the day of the procedure  Dr. Owens Shark will call you with you with labs   Thank you for choosing [facility].   Please call [phone] with any questions about today's appointment.  Please be sure to schedule follow up at the front  desk before you leave today.   Kathrin Ruddy, MD  Family Medicine

## 2020-07-19 NOTE — Progress Notes (Addendum)
Patient Name: [name] Date of Birth: [birth year] Date of Visit: 07/21/20 PCP: Martyn Malay, MD  Chief Complaint: preoperative evaluation Surgeon:  Dr Ernest Haber Surgery: Bladder Biopsy/Cyscoscopy Date of Procedure: N/A  Subjective: Ivan [name] is a pleasant 75 y.o. with medical history significant for CAD s/p CABG presenting today for preoperative evaluation.   The patient can walk multiple city blocks without issue and or up 2 flights of stairs without difficulty.  Prior surgeries:  Difficulty with surgical procedures in the past: No History of difficulty with anesthesia: No History of venous thromboembolic disease: No History of easy bleeding with procedures: No Family history of venous thromboembolic disease: No Family history of bleeding diathesis: No   Review of systems: Chest pain with exertion? No Dyspnea on exertion? No Can you walk 2 blocks without dyspnea or chest pain? Yes Can you walk up a flight of stairs without chest pain or dyspnea? Yes Any history of easy bruising or bleeding? No Any family history of bleeding diathesis? No Any personal or family of difficulty with anesthesia? No Any history of sleep apnea? No  Medical History: Cardiac conditions? CABG procedure,CAD History of VTE? No Diabetes? No Obesity? No   ROS Denies chest pain, dyspnea on exertion, chronic cough, history of venous thromboembolism, family history of venous thromboembolism, personal or family history of easy bleeding or bruising, or personal history or family history of difficulty with anesthesia.  I have reviewed the patient's medical, surgical, family, and social history as appropriate.   Vitals:   07/19/20 1057  BP: (!) 112/58  Pulse: 72  SpO2: 99%   Filed Weights   07/19/20 1057  Weight: 207 lb 6.4 oz (94.1 kg)   Physical Exam Constitutional:      General: He is not in acute distress.    Appearance: Normal appearance. He is not ill-appearing.  HENT:      Head: Normocephalic and atraumatic.     Mouth/Throat:     Mouth: Mucous membranes are moist.  Cardiovascular:     Rate and Rhythm: Normal rate and regular rhythm.     Pulses: Normal pulses.     Heart sounds: No murmur heard. No friction rub. No gallop.   Pulmonary:     Effort: Pulmonary effort is normal.     Breath sounds: Normal breath sounds.  Musculoskeletal:        General: No swelling.     Right lower leg: No edema.     Left lower leg: No edema.  Neurological:     General: No focal deficit present.     Mental Status: He is alert.     Motor: No weakness.     Gait: Gait normal.  Psychiatric:        Mood and Affect: Mood normal.        Behavior: Behavior normal.     Preoperative Exam Patient is elevated risk for a low-risk surgery.  RCRI index of 6% given risk of ischemic heart disease.  The patient has been medically optimized for this procedure. In addition,  I make the following recommendations for further evaluation and medication adjustments prior to their  planned procedure: - Contact cardiologist to discuss whether or not to start Aspirin  - Currently using Gabapentin PRN for pain and takes Daily Statin, recommend holding both prior to procedure - Do not restart testosterone supplement prior to surgery, and discuss with urologist after surgery prior to restarting - Obtain A1C, BMP, and CBC  Angioedema Patient previously taking  Lisinopril, had episode of angioedema involving lip swelling, now resolved, and stopped taking Lisinopril.  BP well controlled today at 112/58. - Do not restart Lisinopril or start another ACE/ARB in future - BP well controlled today so do not need additional medicine for control at this time, continue to monitor BP going forward and modify BP regimen as indicates    Delora Fuel, MD

## 2020-07-20 LAB — BASIC METABOLIC PANEL
BUN/Creatinine Ratio: 34 — ABNORMAL HIGH (ref 10–24)
BUN: 32 mg/dL — ABNORMAL HIGH (ref 8–27)
CO2: 23 mmol/L (ref 20–29)
Calcium: 9.1 mg/dL (ref 8.6–10.2)
Chloride: 100 mmol/L (ref 96–106)
Creatinine, Ser: 0.94 mg/dL (ref 0.76–1.27)
Glucose: 152 mg/dL — ABNORMAL HIGH (ref 65–99)
Potassium: 5.2 mmol/L (ref 3.5–5.2)
Sodium: 140 mmol/L (ref 134–144)
eGFR: 86 mL/min/{1.73_m2} (ref >59)

## 2020-07-20 LAB — HEMOGLOBIN A1C
Est. average glucose Bld gHb Est-mCnc: 120 mg/dL
Hgb A1c MFr Bld: 5.8 % — ABNORMAL HIGH (ref 4.8–5.6)

## 2020-07-20 LAB — CBC
Hematocrit: 49.5 % (ref 37.5–51.0)
Hemoglobin: 16.6 g/dL (ref 13.0–17.7)
MCH: 30.4 pg (ref 26.6–33.0)
MCHC: 33.5 g/dL (ref 31.5–35.7)
MCV: 91 fL (ref 79–97)
Platelets: 348 10*3/uL (ref 150–450)
RBC: 5.46 x10E6/uL (ref 4.14–5.80)
RDW: 14.2 % (ref 11.6–15.4)
WBC: 10 10*3/uL (ref 3.4–10.8)

## 2020-07-21 ENCOUNTER — Encounter: Payer: Self-pay | Admitting: Family Medicine

## 2020-07-21 ENCOUNTER — Telehealth: Payer: Self-pay | Admitting: Family Medicine

## 2020-07-21 DIAGNOSIS — T783XXA Angioneurotic edema, initial encounter: Secondary | ICD-10-CM | POA: Insufficient documentation

## 2020-07-21 NOTE — Addendum Note (Signed)
Addended by: Owens Shark, Meesha Sek on: 07/21/2020 02:34 PM   Modules accepted: Level of Service

## 2020-07-21 NOTE — Progress Notes (Signed)
Letter for preop completed.   Nursing- Please fax letter, recent office note (Dr. Manus Rudd) and recent labs from March 9th to [facility], Dr. Arnette Schaumann.  Let me know if any questions.  [name], MD  Family Medicine Teaching Service

## 2020-07-21 NOTE — Telephone Encounter (Signed)
Called patient about recent labs--reviewed. CBC unremarkable, BMP appropriate. Repeat A1C in 12 months.   [name], MD  Family Medicine Teaching Service

## 2020-07-21 NOTE — Assessment & Plan Note (Signed)
Patient previously taking Lisinopril, had episode of angioedema involving lip swelling, now resolved, and stopped taking Lisinopril.  BP well controlled today at 112/58. - Do not restart Lisinopril or start another ACE/ARB in future - BP well controlled today so do not need additional medicine for control at this time, continue to monitor BP going forward and modify BP regimen as indicates

## 2020-07-24 NOTE — Progress Notes (Signed)
Recent office notes with labs and letter faxed to [facility].

## 2020-08-01 ENCOUNTER — Encounter (HOSPITAL_BASED_OUTPATIENT_CLINIC_OR_DEPARTMENT_OTHER): Payer: Medicare HMO | Attending: Internal Medicine | Admitting: Internal Medicine

## 2020-08-01 ENCOUNTER — Other Ambulatory Visit: Payer: Self-pay

## 2020-08-01 DIAGNOSIS — Z888 Allergy status to other drugs, medicaments and biological substances status: Secondary | ICD-10-CM | POA: Insufficient documentation

## 2020-08-01 DIAGNOSIS — Z881 Allergy status to other antibiotic agents status: Secondary | ICD-10-CM | POA: Diagnosis not present

## 2020-08-01 DIAGNOSIS — L89224 Pressure ulcer of left hip, stage 4: Secondary | ICD-10-CM | POA: Insufficient documentation

## 2020-08-01 DIAGNOSIS — Z8616 Personal history of COVID-19: Secondary | ICD-10-CM | POA: Insufficient documentation

## 2020-08-01 NOTE — Progress Notes (Signed)
VLAD, MAYBERRY ([id number]) Visit Report for 08/01/2020 Debridement Details Patient Name: Date of Service: Ivan [name]. 08/01/2020 10:00 A M Medical Record Number: [id number] Patient Account Number: [id number] Date of Birth/Sex: Treating RN: 1945/08/20 (75 y.o. [name], Lauren Primary Care Provider: [name] Other Clinician: Referring Provider: Treating Provider/Extender: Fonnie Jarvis in Treatment: 4 Debridement Performed for Assessment: Wound #2 Left Trochanter Performed By: Physician [name]., MD Debridement Type: Debridement Level of Consciousness (Pre-procedure): Awake and Alert Pre-procedure Verification/Time Out Yes - 11:12 Taken: Start Time: 11:12 Pain Control: Lidocaine T Area Debrided (L x W): otal 1.7 (cm) x 1 (cm) = 1.7 (cm) Tissue and other material debrided: Viable, Non-Viable, Subcutaneous, Skin: Dermis , Skin: Epidermis Level: Skin/Subcutaneous Tissue Debridement Description: Excisional Instrument: Curette Bleeding: Minimum Hemostasis Achieved: Pressure End Time: 11:12 Procedural Pain: 0 Post Procedural Pain: 0 Response to Treatment: Procedure was tolerated well Level of Consciousness (Post- Awake and Alert procedure): Post Debridement Measurements of Total Wound Length: (cm) 1.7 Width: (cm) 1 Depth: (cm) 1.7 Volume: (cm) 2.27 Character of Wound/Ulcer Post Debridement: Improved Post Procedure Diagnosis Same as Pre-procedure Electronic Signature(s) Signed: 08/01/2020 5:23:06 PM By: Rhae Hammock RN Signed: 08/01/2020 5:50:43 PM By: [name] MD Entered By: [name] on 08/01/2020 11:53:51 -------------------------------------------------------------------------------- HPI Details Patient Name: Date of Service: Ivan [name], Ivan BERT W. 08/01/2020 10:00 A M Medical Record Number: [id number] Patient Account Number: [id number] Date of Birth/Sex: Treating RN: 1945-09-03 (75 y.o. 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 He required admission to hospital from 1/5 through 1/9 with Covid pneumonia. At that point he apparently left AMA. He was noted to have a left hip bruise at the time of discharge. I suspect this really was a deep tissue injury. He was readmitted from 1/13 through 1//27 with altered mental status Covid pneumonia and delirium. At that point he had a left hip and thigh wound which had worsened. Felt to be a pressure injury. He was seen by general surgery who recommended normal saline wet-to-dry twice daily he did not have a debridement. After he left at that point he was incapacitated by increasing lumbar pain. An MRI on 2/9 showed discitis and osteomyelitis at L1/2. He asked aspiration of 1 cm fluid however I think this culture negative. He was also noted at this point to have a very large wound over the left greater trochanter. He was taken to the OR by Dr. Marla Roe for debridement with ACell placement. He was placed in a wound VAC. A culture at that point showed Enterococcus and Enterobacter. An MRI on 3/15 showed no evidence of left hip septic arthritis or osteomyelitis. Extensive subcutaneous edema within the visualized soft tissue of the left thigh and prominent left inguinal lymph nodes likely reactive. He was started on IV cefepime and vancomycin for 6 weeks. He was discharged to [facility] facility as the patient. There I gather the wound developed the necrotic surface. He was seen by the wound care doctor in house who correctly pointed out that a wound VAC would not do anything with a necrotic surface. Since then he has been using Santyl The patient is now at home. He was on  vancomycin and cefepime. He developed hives and a generalized rash and was seen  in the ER on 3/15 by Dr. Cain Sieve recommendation he was changed to meropenem however he also apparently developed hives after this. He says the original hives stopped after the cefepime was stopped and follow-up hives only developed after the meropenem. He is only taken 1 dose of the fear of the meropenem and now is on vancomycin. 3/25; the patient was seen by Dr. Linus Salmons on 3/22. Because of intolerances to meropenem which I communicated with Dr. De Burrs about he is now on daptomycin and Cipro until sometime in early April. He seems to be tolerating the antibiotics well. We are using Santyl back by wet-to-dry dressings. And making some improvement in the general surface of this very large wound 4/8; we are using Santyl back by wet-to-dry. We are making progress in terms of prepping the wound for a wound VAC. Still necrotic material requiring debridement. 4/16; we are using Santyl back by wet-to-dry. Not convinced that his wife is packing this properly there is a 6 cm tunnel at 12:00. We brought this to attention. Still requiring mechanical debridement 4/29; 2-week follow-up. In general the wound is better however there is considerable tunneling tunneling to the greater trochanter itself. There is still some debris in the deeper parts of this wound I did not do mechanical debridement on this today. I am going to change the dressing to quarter strength Dakin's wet-to-dry. We will put a wound VAC through his insurance. His wife has had brain damage from a previous meningioma surgery. She is not comfortable around strangers in her home. The patient is concerned about this 5/18; patient is using quarter strength Dakin's wet to dry. The problem is a deep pressure area over the left greater trochanter. There was some question from her intake nurses that this is actually come in and some question raised about whether he would require  a wound VAC or not. The issue with the wound VAC was complicated predominantly by the patient's reluctance to allow people into his home i.e. home health although he tells me he has this mostly "sorted out" 7/1; this is a patient that I have not seen in almost 6 weeks. Apparently canceled several appointments related to family problems deaths in the family etc. He is wife has ophthalmologic surgery next week. I had been trying to get a wound VAC ordered for this deep probing wound over the left greater trochanter but so far I think he is simply doing a Dakin's wet-to-dry and our intake nurse did not think there was any Dakin's on it. I have a note from Dr. Tommy Medal of infectious disease on 6/30. He has now been off doxycycline and ciprofloxacin for several weeks. He has no undue pain or drainage. He also had discitis and osteomyelitis at L1-L2 with a 1 cm left psoas abscess as well as some increased signal at T10/T11. He is not having any problems related to this. 12/16/2019 on evaluation today patient's wound actually is showing some signs of bit of improvement which is good news. Fortunately there is no signs of active infection at this time which is good news. He tells me that the nurses have been coming to change this 3 times a week and doing a great job. He told the intake nurse that they had only been coming out once a week. I am not really sure what is going on with the discrepancy and stories here. Nonetheless I do believe that he needs to have this changed 3 times a week on a regular basis  he does seem to have a little bit of a fungal infection around the wound. 8/19; 2-week follow-up. I have not seen this patient in over 6 weeks. There is been a dramatic improvement in the overall wound volume. Circumference of the wound is now small although there is still considerable undermining from roughly 9-2 o'clock maximum 1:00 at roughly 4.7 cm. There is no purulent drainage. He has some irritation  around the wound on the normal skin. They are using miconazole on this. 9/2; 2-week follow-up. Dimensions of the wound circumference look quite good. There is tunneling at 4.3 cm at 1:00 versus 4.7 last time. The area continues to look like it is contracting with the wound VAC and I think the treatment of choice here will be continuing to the wound VAC as long as it seems practical. There is no evidence of infection. As far as I am aware he is offloading this aggressively 9/16; 2-week follow-up. Left greater trochanter. Dimensions about the same still about 4.9 cm of maximal undermining. Nevertheless the visible tissue that I can see looks healthy. We have a wound VAC and really from the perspective of how this looks initially this is gotten a lot better however not much change in the last week 9/30 2-week follow-up. Left greater trochanter. He has a tunnel at 12:00 that goes about 7 cm. From 10-12 lesser degrees of undermining. HOWEVER he comes in today telling us that he is going to have to go back to work. He will not be able to work with a wound VAC and he wants that discontinued. There was not much discussion. This is a financial issue for him. He also has erythema around the wound orifice which has satellite lesions around it this is no doubt cutaneous candidiasis 10/12; 2-week follow-up. Left greater trochanter. 7.2 cm of tunneling. 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 He has not been systemically unwell. 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 He is using silver alginate which he is dressing himself, ABDs with a large plastic occlusive dressing. Dimensions are somewhat better although this is an irregular wound. When he was here the last time he had a lot of drainage coming out of this wound. The culture I did showed methicillin sensitive  staph aureus. I gave him 10 days of doxycycline and he is just finishing up on this. He states the drainage is a lot better 04/03/2020 upon evaluation today patient appears to be doing well with regard to his wound as far as the overall appearance is concerned the one issue that he is noting is that he does have some green drainage occurring at times. Obviously this brings up the concern for Pseudomonas I will see anything visually right now that has me overly concerned about at the same time that history does concern me. The patient would like to see if we can attempt to treat this today. I think that being that green drainage is pretty much pathognomonic for a Pseudomonas infection I think he can continue to doxycycline but probably should do a couple weeks of Cipro to try to see if that clears up the drainage if not then we may need to delve into a more extensive culture possibly PCR at that point. With regard to the wound itself also believe based on talking with the patient  today that he could be a candidate for a flap surgery that something he would like to consider READMISSION 07/04/2020 Ivan [name] is a 75 year old man we had in clinic until the end of November 2021. He had a substantial wound on the left greater trochanter that required an original IandD by Dr. Marla Roe early in 2021. This was an infected wound and had IV antibiotics directed by infectious disease. He also had an L1-L2 discitis. We may have him in clinic here we initially put him in a wound VAC and he actually did quite well with this with filling in of the underlying tissue however he had to go back to work. 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 He is still working there 40 hours a week second shift. He was coming to this clinic until late in November I am not exactly sure what happened. I had not referred him to plastic surgery because he would have to inevitably take time off work that he was  not prepared to do. The orifice of the wound was too small to really reconsider a wound VAC and that still true. He arrives back in clinic with a direct depth of the wound at 1.5 cm that tunnel goes to 3.1 cm from 3.6 last time. He saw Dr. Johnnye Sima on 04/14/2020. He ordered a follow-up MRI. This showed persistence of a peritrochanteric fluid collection in the bursa which was slightly larger than the previous image. This communicated with the wounds. The patient still says he is having clear fluid drainage and he is changing his dressing twice a day. He is currently using predominantly half-strength Dakin's wet-to-dry and he correctly notes that the wound is clean and has come in significantly albeit slowly. He has not noticed any systemic symptoms. No real pain. Last measurements of his C-reactive protein and sedimentation rate were 3.7 and 2 respectively. He was supposed to follow-up with Dr. Johnnye Sima I am not sure that he has. Dr. Johnnye Sima had tried to organize an appointment with Dr. Marla Roe I do not think that ever got done either. The patient tells me he is not willing to consider any further surgery to this area until he has short-term disability time which he thinks will happen sometime in May or June 3/22; monthly follow-up. Minimal change in the important measurements of depth at 1.7 and the 12:00 tunnel at 2.6. We have been referred back to Dr. Marla Roe I think by Dr. Johnnye Sima although he does not yet have a plastics appointment Electronic Signature(s) Signed: 08/01/2020 5:50:43 PM By: [name] MD Entered By: [name] on 08/01/2020 12:00:57 -------------------------------------------------------------------------------- Physical Exam Details Patient Name: Date of Service: Ivan [name], Ivan BERT W. 08/01/2020 10:00 A M Medical Record Number: [id number] Patient Account Number: [id number] Date of Birth/Sex: Treating RN: 1945/11/26 (75 y.o. [name] Primary Care Provider:  [name] Other Clinician: Referring Provider: Treating Provider/Extender: Ronnie Derby, [name] in Treatment: 4 Constitutional Patient is hypertensive.. Pulse regular and within target range for patient.Marland Kitchen Respirations regular, non-labored and within target range.. Temperature is normal and within the target range for the patient.Marland Kitchen Appears in no distress. Notes Wound exam; left greater trochanter. Still about 1.5 cm of direct depth about 2.6 cm of probing tunnel at roughly 12:00. I used a #3 curette to clean up the surfaces of the wound actually cleaned up quite nicely Electronic Signature(s) Signed: 08/01/2020 5:50:43 PM By: [name] MD Entered By: [name] on 08/01/2020 12:02:36 --------------------------------------------------------------------------------  Physician Orders Details Patient Name: Date of Service: Ivan [name]. 08/01/2020 10:00 A M Medical Record Number: [id number] Patient Account Number: [id number] Date of Birth/Sex: Treating RN: 1945-11-27 (75 y.o. [name] Primary Care Provider: [name] Other Clinician: Referring Provider: Treating Provider/Extender: Fonnie Jarvis in Treatment: 4 Verbal / Phone Orders: No Diagnosis Coding Follow-up Appointments Return appointment in 1 month. Bathing/ Shower/ Hygiene May shower with protection but do not get wound dressing(s) wet. Off-Loading Turn and reposition every 2 hours Wound Treatment Wound #2 - Trochanter Wound Laterality: Left Cleanser: Wound Cleanser (DME) (Generic) 2 x Per Day/15 Days Discharge Instructions: Cleanse the wound with wound cleanser prior to applying a clean dressing using gauze sponges, not tissue or cotton balls. Peri-Wound Care: Zinc Oxide Ointment 30g tube 2 x Per Day/15 Days Discharge Instructions: Apply Zinc Oxide to periwound with each dressing change Prim Dressing: Wound Hydrogel, 3 (oz), tube (DME) (Generic) 2 x Per  Day/15 Days ary Discharge Instructions: moistened hydrogel gauze packed into wound lightly Secondary Dressing: Woven Gauze Sponge, Non-Sterile 4x4 in (DME) (Generic) 2 x Per Day/15 Days Discharge Instructions: Apply over primary dressing as directed. Secondary Dressing: ABD Pad, 5x9 (DME) 2 x Per JTT/01 Days Discharge Instructions: Apply over primary dressing as directed. Secured With: 82M Medipore H Soft Cloth Surgical T 4 x 2 (in/yd) (DME) (Generic) 2 x Per Day/15 Days ape Discharge Instructions: Secure dressing with tape as directed. Electronic Signature(s) Signed: 08/01/2020 5:23:06 PM By: Rhae Hammock RN Signed: 08/01/2020 5:50:43 PM By: [name] MD Entered By: Rhae Hammock on 08/01/2020 11:17:26 -------------------------------------------------------------------------------- Problem List Details Patient Name: Date of Service: Ivan [name], Ivan BERT W. 08/01/2020 10:00 A M Medical Record Number: [id number] Patient Account Number: [id number] Date of Birth/Sex: Treating RN: 1945/06/05 (75 y.o. [name], Lauren Primary Care Provider: [name] Other Clinician: Referring Provider: Treating Provider/Extender: Ronnie Derby, [name] in Treatment: 4 Active Problems ICD-10 Encounter Code Description Active Date MDM Diagnosis L89.224 Pressure ulcer of left hip, stage 4 07/04/2020 No Yes Inactive Problems Resolved Problems Electronic Signature(s) Signed: 08/01/2020 5:50:43 PM By: [name] MD Entered By: [name] on 08/01/2020 11:52:07 -------------------------------------------------------------------------------- Progress Note Details Patient Name: Date of Service: Ivan [name], Ivan BERT W. 08/01/2020 10:00 A M Medical Record Number: [id number] Patient Account Number: [id number] Date of Birth/Sex: Treating RN: 1945-06-06 (75 y.o. 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 He required admission to hospital from 1/5 through 1/9 with Covid pneumonia. At that point he apparently left AMA. He was noted to have a left hip bruise at the time of discharge. I suspect this really was a deep tissue injury. He was readmitted from 1/13 through 1//27 with altered mental status Covid pneumonia and delirium. At that point he had a left hip and thigh wound which had worsened. Felt to be a pressure injury. He was seen by general surgery who recommended normal saline wet-to-dry twice daily he did not have a debridement. After he left at that point he was incapacitated by increasing lumbar pain. An MRI on 2/9 showed discitis and osteomyelitis at L1/2. He asked aspiration of 1 cm fluid however I think this culture negative. He was also noted at this point to have a very large wound over the  left greater trochanter. He was taken to the OR by Dr. Marla Roe for debridement with ACell placement. He was placed in a wound VAC. A culture at that point showed Enterococcus and Enterobacter. An MRI on 3/15 showed no evidence of left hip septic arthritis or osteomyelitis. Extensive subcutaneous edema within the visualized soft tissue of the left thigh and prominent left inguinal lymph nodes likely reactive. He was started on IV cefepime and vancomycin for 6 weeks. He was discharged to [facility] facility as the patient. There I gather the wound developed the necrotic surface. He was seen by the wound care doctor in house who correctly pointed out that a wound VAC would not do anything with a necrotic surface. Since then he has been using Santyl The patient is now at home. He was on vancomycin and cefepime. He developed hives and a generalized rash and was seen in the ER on 3/15 by  Dr. Cain Sieve recommendation he was changed to meropenem however he also apparently developed hives after this. He says the original hives stopped after the cefepime was stopped and follow-up hives only developed after the meropenem. He is only taken 1 dose of the fear of the meropenem and now is on vancomycin. 3/25; the patient was seen by Dr. Linus Salmons on 3/22. Because of intolerances to meropenem which I communicated with Dr. De Burrs about he is now on daptomycin and Cipro until sometime in early April. He seems to be tolerating the antibiotics well. We are using Santyl back by wet-to-dry dressings. And making some improvement in the general surface of this very large wound 4/8; we are using Santyl back by wet-to-dry. We are making progress in terms of prepping the wound for a wound VAC. Still necrotic material requiring debridement. 4/16; we are using Santyl back by wet-to-dry. Not convinced that his wife is packing this properly there is a 6 cm tunnel at 12:00. We brought this to attention. Still requiring mechanical debridement 4/29; 2-week follow-up. In general the wound is better however there is considerable tunneling tunneling to the greater trochanter itself. There is still some debris in the deeper parts of this wound I did not do mechanical debridement on this today. I am going to change the dressing to quarter strength Dakin's wet-to-dry. We will put a wound VAC through his insurance. His wife has had brain damage from a previous meningioma surgery. She is not comfortable around strangers in her home. The patient is concerned about this 5/18; patient is using quarter strength Dakin's wet to dry. The problem is a deep pressure area over the left greater trochanter. There was some question from her intake nurses that this is actually come in and some question raised about whether he would require a wound VAC or not. The issue with the wound VAC was complicated predominantly by the patient's  reluctance to allow people into his home i.e. home health although he tells me he has this mostly "sorted out" 7/1; this is a patient that I have not seen in almost 6 weeks. Apparently canceled several appointments related to family problems deaths in the family etc. He is wife has ophthalmologic surgery next week. I had been trying to get a wound VAC ordered for this deep probing wound over the left greater trochanter but so far I think he is simply doing a Dakin's wet-to-dry and our intake nurse did not think there was any Dakin's on it. I have a note from Dr. Tommy Medal of infectious disease on 6/30. He  has now been off doxycycline and ciprofloxacin for several weeks. He has no undue pain or drainage. He also had discitis and osteomyelitis at L1-L2 with a 1 cm left psoas abscess as well as some increased signal at T10/T11. He is not having any problems related to this. 12/16/2019 on evaluation today patient's wound actually is showing some signs of bit of improvement which is good news. Fortunately there is no signs of active infection at this time which is good news. He tells me that the nurses have been coming to change this 3 times a week and doing a great job. He told the intake nurse that they had only been coming out once a week. I am not really sure what is going on with the discrepancy and stories here. Nonetheless I do believe that he needs to have this changed 3 times a week on a regular basis he does seem to have a little bit of a fungal infection around the wound. 8/19; 2-week follow-up. I have not seen this patient in over 6 weeks. There is been a dramatic improvement in the overall wound volume. Circumference of the wound is now small although there is still considerable undermining from roughly 9-2 o'clock maximum 1:00 at roughly 4.7 cm. There is no purulent drainage. He has some irritation around the wound on the normal skin. They are using miconazole on this. 9/2; 2-week follow-up.  Dimensions of the wound circumference look quite good. There is tunneling at 4.3 cm at 1:00 versus 4.7 last time. The area continues to look like it is contracting with the wound VAC and I think the treatment of choice here will be continuing to the wound VAC as long as it seems practical. There is no evidence of infection. As far as I am aware he is offloading this aggressively 9/16; 2-week follow-up. Left greater trochanter. Dimensions about the same still about 4.9 cm of maximal undermining. Nevertheless the visible tissue that I can see looks healthy. We have a wound VAC and really from the perspective of how this looks initially this is gotten a lot better however not much change in the last week 9/30 2-week follow-up. Left greater trochanter. He has a tunnel at 12:00 that goes about 7 cm. From 10-12 lesser degrees of undermining. HOWEVER he comes in today telling us that he is going to have to go back to work. He will not be able to work with a wound VAC and he wants that discontinued. There was not much discussion. This is a financial issue for him. He also has erythema around the wound orifice which has satellite lesions around it this is no doubt cutaneous candidiasis 10/12; 2-week follow-up. Left greater trochanter. 7.2 cm of tunneling. 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 He has not been systemically unwell. 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 He is using silver alginate which he is dressing himself, ABDs with a large plastic occlusive dressing. Dimensions are somewhat better although this is an irregular wound. When he was here the last time he had a lot of drainage coming out of this wound. The culture I did showed methicillin sensitive staph aureus. I gave him 10 days of doxycycline and he is just finishing up on this. He states the  drainage is a lot better 04/03/2020 upon evaluation today patient  appears to be doing well with regard to his wound as far as the overall appearance is concerned the one issue that he is noting is that he does have some green drainage occurring at times. Obviously this brings up the concern for Pseudomonas I will see anything visually right now that has me overly concerned about at the same time that history does concern me. The patient would like to see if we can attempt to treat this today. I think that being that green drainage is pretty much pathognomonic for a Pseudomonas infection I think he can continue to doxycycline but probably should do a couple weeks of Cipro to try to see if that clears up the drainage if not then we may need to delve into a more extensive culture possibly PCR at that point. With regard to the wound itself also believe based on talking with the patient today that he could be a candidate for a flap surgery that something he would like to consider READMISSION 07/04/2020 Ivan [name] is a 76 year old man we had in clinic until the end of November 2021. He had a substantial wound on the left greater trochanter that required an original IandD by Dr. Marla Roe early in 2021. This was an infected wound and had IV antibiotics directed by infectious disease. He also had an L1-L2 discitis. We may have him in clinic here we initially put him in a wound VAC and he actually did quite well with this with filling in of the underlying tissue however he had to go back to work. 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 He is still working there 40 hours a week second shift. He was coming to this clinic until late in November I am not exactly sure what happened. I had not referred him to plastic surgery because he would have to inevitably take time off work that he was not prepared to do. The orifice of the wound was too small to really reconsider a wound VAC and  that still true. He arrives back in clinic with a direct depth of the wound at 1.5 cm that tunnel goes to 3.1 cm from 3.6 last time. He saw Dr. Johnnye Sima on 04/14/2020. He ordered a follow-up MRI. This showed persistence of a peritrochanteric fluid collection in the bursa which was slightly larger than the previous image. This communicated with the wounds. The patient still says he is having clear fluid drainage and he is changing his dressing twice a day. He is currently using predominantly half-strength Dakin's wet-to-dry and he correctly notes that the wound is clean and has come in significantly albeit slowly. He has not noticed any systemic symptoms. No real pain. Last measurements of his C-reactive protein and sedimentation rate were 3.7 and 2 respectively. He was supposed to follow-up with Dr. Johnnye Sima I am not sure that he has. Dr. Johnnye Sima had tried to organize an appointment with Dr. Marla Roe I do not think that ever got done either. The patient tells me he is not willing to consider any further surgery to this area until he has short-term disability time which he thinks will happen sometime in May or June 3/22; monthly follow-up. Minimal change in the important measurements of depth at 1.7 and the 12:00 tunnel at 2.6. We have been referred back to Dr. Marla Roe I think by Dr. Johnnye Sima although he does not yet have a plastics appointment Allergies Iodinated Contrast Media, cefepime, meropenem, vancomycin (Reaction: rash), lisinopril (Reaction: facial  swelling) Objective Constitutional Patient is hypertensive.. Pulse regular and within target range for patient.Marland Kitchen Respirations regular, non-labored and within target range.. Temperature is normal and within the target range for the patient.Marland Kitchen Appears in no distress. Vitals Time Taken: 10:19 AM, Height: 70 in, Source: Stated, Weight: 209 lbs, Source: Stated, BMI: 30, Temperature: 98.3 F, Pulse: 76 bpm, Respiratory Rate: 18 breaths/min, Blood  Pressure: 161/86 mmHg. General Notes: Wound exam; left greater trochanter. Still about 1.5 cm of direct depth about 2.6 cm of probing tunnel at roughly 12:00. I used a #3 curette to clean up the surfaces of the wound actually cleaned up quite nicely Integumentary (Hair, Skin) Wound #2 status is Open. Original cause of wound was Pressure Injury. The date acquired was: 05/18/2019. The wound has been in treatment 4 weeks. The wound is located on the Left Trochanter. The wound measures 1.7cm length x 1cm width x 1.7cm depth; 1.335cm^2 area and 2.27cm^3 volume. There is Fat Layer (Subcutaneous Tissue) exposed. There is no undermining noted, however, there is tunneling at 12:00 with a maximum distance of 3cm. There is a medium amount of serous drainage noted. The wound margin is epibole. There is large (67-100%) red granulation within the wound bed. There is no necrotic tissue within the wound bed. Assessment Active Problems ICD-10 Pressure ulcer of left hip, stage 4 Procedures Wound #2 Pre-procedure diagnosis of Wound #2 is a Pressure Ulcer located on the Left Trochanter . There was a Excisional Skin/Subcutaneous Tissue Debridement with a total area of 1.7 sq cm performed by [name]., MD. With the following instrument(s): Curette to remove Viable and Non-Viable tissue/material. Material removed includes Subcutaneous Tissue, Skin: Dermis, and Skin: Epidermis after achieving pain control using Lidocaine. No specimens were taken. A time out was conducted at 11:12, prior to the start of the procedure. A Minimum amount of bleeding was controlled with Pressure. The procedure was tolerated well with a pain level of 0 throughout and a pain level of 0 following the procedure. Post Debridement Measurements: 1.7cm length x 1cm width x 1.7cm depth; 2.27cm^3 volume. Character of Wound/Ulcer Post Debridement is improved. Post procedure Diagnosis Wound #2: Same as Pre-Procedure Plan Follow-up  Appointments: Return appointment in 1 month. Bathing/ Shower/ Hygiene: May shower with protection but do not get wound dressing(s) wet. Off-Loading: Turn and reposition every 2 hours WOUND #2: - Trochanter Wound Laterality: Left Cleanser: Wound Cleanser (DME) (Generic) 2 x Per Day/15 Days Discharge Instructions: Cleanse the wound with wound cleanser prior to applying a clean dressing using gauze sponges, not tissue or cotton balls. Peri-Wound Care: Zinc Oxide Ointment 30g tube 2 x Per Day/15 Days Discharge Instructions: Apply Zinc Oxide to periwound with each dressing change Prim Dressing: Wound Hydrogel, 3 (oz), tube (DME) (Generic) 2 x Per Day/15 Days ary Discharge Instructions: moistened hydrogel gauze packed into wound lightly Secondary Dressing: Woven Gauze Sponge, Non-Sterile 4x4 in (DME) (Generic) 2 x Per Day/15 Days Discharge Instructions: Apply over primary dressing as directed. Secondary Dressing: ABD Pad, 5x9 (DME) 2 x Per QQP/61 Days Discharge Instructions: Apply over primary dressing as directed. Secured With: 20M Medipore H Soft Cloth Surgical T 4 x 2 (in/yd) (DME) (Generic) 2 x Per Day/15 Days ape Discharge Instructions: Secure dressing with tape as directed. 1. I spent some time talking the patient about the options here. Ideally I would like to use silver collagen with backing wet-to-dry however the patient seems to want to just use a hydrogel wet-to-dry. 2. I do not think Dakin's wet-to-dry  is doing any good for this patient right now. 3. He has an appointment or is supposed to have an appointment with plastic surgery [Dr. Dillingham] to consider for flap closure. I think that is fine 4. I suppose we could consider a snap VAC at this point although he told me initially he would not be able to wear a back at work [employer] in a skilled facility]. I wonder whether there even happy with him having a wound at all Electronic Signature(s) Signed: 08/01/2020 5:50:43 PM By: [name] MD Entered By: [name] on 08/01/2020 12:04:10 -------------------------------------------------------------------------------- SuperBill Details Patient Name: Date of Service: Ivan [name], Ivan BERT W. 08/01/2020 Medical Record Number: [id number] Patient Account Number: [id number] Date of Birth/Sex: Treating RN: 1946-04-07 (75 y.o. [name], Lauren Primary Care Provider: [name] Other Clinician: Referring Provider: Treating Provider/Extender: Ronnie Derby, [name] in Treatment: 4 Diagnosis Coding ICD-10 Codes Code Description [phone] Pressure ulcer of left hip, stage 4 Facility Procedures CPT4 Code: [id number] Description: 92446 - DEB SUBQ TISSUE 20 SQ CM/< ICD-10 Diagnosis Description L89.224 Pressure ulcer of left hip, stage 4 Modifier: Quantity: 1 Physician Procedures : CPT4 Code Description Modifier [id number] - WC PHYS SUBQ TISS 20 SQ CM ICD-10 Diagnosis Description L89.224 Pressure ulcer of left hip, stage 4 Quantity: 1 Electronic Signature(s) Signed: 08/01/2020 5:50:43 PM By: [name] MD Entered By: [name] on 08/01/2020 12:04:21

## 2020-08-02 NOTE — Progress Notes (Signed)
Ivan [name] ([id number]) Visit Report for 08/01/2020 Allergy List Details Patient Name: Date of Service: Ivan [name]. 08/01/2020 10:00 A M Medical Record Number: [id number] Patient Account Number: [id number] Date of Birth/Sex: Treating RN: 30-Aug-1945 (75 y.o. [name] Primary Care Timara Loma: [name] Other Clinician: Referring Korby Ratay: Treating Yamaira Spinner/Extender: Ronnie Derby, [name] in Treatment: 4 Allergies Active Allergies Iodinated Contrast Media cefepime meropenem vancomycin Reaction: rash lisinopril Reaction: facial swelling Allergy Notes Electronic Signature(s) Signed: 08/01/2020 5:27:17 PM By: Baruch Gouty RN, BSN Entered By: Baruch Gouty on 08/01/2020 10:19:00 -------------------------------------------------------------------------------- Arrival Information Details Patient Name: Date of Service: Ivan [name], [name] 08/01/2020 10:00 A M Medical Record Number: [id number] Patient Account Number: [id number] Date of Birth/Sex: Treating RN: 06-03-1945 (75 y.o. [name] Primary Care Necha Harries: [name] Other Clinician: Referring Alonte Wulff: Treating Aditri Louischarles/Extender: Fonnie Jarvis in Treatment: 4 Visit Information Patient Arrived: Ambulatory Arrival Time: 10:19 Accompanied By: self Transfer Assistance: None Patient Identification Verified: Yes Secondary Verification Process Completed: Yes Patient Requires Transmission-Based Precautions: No Patient Has Alerts: No History Since Last Visit Added or deleted any medications: Yes Any new allergies or adverse reactions: No Had a fall or experienced change in activities of daily living that may affect risk of falls: No Signs or symptoms of abuse/neglect since last visito No Hospitalized since last visit: No Implantable device outside of the clinic excluding cellular tissue based products placed in the center since last visit: No Has Dressing  in Place as Prescribed: Yes Electronic Signature(s) Signed: 08/01/2020 5:27:17 PM By: Baruch Gouty RN, BSN Entered By: Baruch Gouty on 08/01/2020 10:19:45 -------------------------------------------------------------------------------- Encounter Discharge Information Details Patient Name: Date of Service: Ivan [name], [name] 08/01/2020 10:00 A M Medical Record Number: [id number] Patient Account Number: [id number] Date of Birth/Sex: Treating RN: [birth year] (76 y.o. [name] Primary Care Caylynn Minchew: [name] Other Clinician: Referring Tillmon Kisling: Treating Norvell Ureste/Extender: Fonnie Jarvis in Treatment: 4 Encounter Discharge Information Items Post Procedure Vitals Discharge Condition: Stable Temperature (F): 98.3 Ambulatory Status: Ambulatory Pulse (bpm): 76 Discharge Destination: Home Respiratory Rate (breaths/min): 18 Transportation: Private Auto Blood Pressure (mmHg): 161/86 Schedule Follow-up Appointment: Yes Clinical Summary of Care: Provided on 08/01/2020 Form Type Recipient Paper Patient Patient Electronic Signature(s) Signed: 08/01/2020 11:32:22 AM By: [name] Entered By: [name] on 08/01/2020 11:32:22 -------------------------------------------------------------------------------- Lower Extremity Assessment Details Patient Name: Date of Service: Ivan [name], [name] 08/01/2020 10:00 A M Medical Record Number: [id number] Patient Account Number: [id number] Date of Birth/Sex: Treating RN: Mar 03, 1946 (75 y.o. [name] Primary Care [name]: [name] Other Clinician: Referring Marti Acebo: Treating Nova Schmuhl/Extender: Fonnie Jarvis in Treatment: 4 Electronic Signature(s) Signed: 08/01/2020 5:27:17 PM By: Baruch Gouty RN, BSN Entered By: Baruch Gouty on 08/01/2020 10:23:05 -------------------------------------------------------------------------------- Multi Wound Chart  Details Patient Name: Date of Service: Ivan [name], [name] 08/01/2020 10:00 A M Medical Record Number: [id number] Patient Account Number: [id number] Date of Birth/Sex: Treating RN: 08-25-1945 (75 y.o. [name], Lauren Primary Care Reeva Davern: [name] Other Clinician: Referring Therron Sells: Treating Markos Theil/Extender: Ronnie Derby, [name] in Treatment: 4 Vital Signs Height(in): 70 Pulse(bpm): 76 Weight(lbs): 209 Blood Pressure(mmHg): 161/86 Body Mass Index(BMI): 30 Temperature(F): 98.3 Respiratory Rate(breaths/min): 18 Photos: [2:No Photos Left Trochanter] [N/A:N/A N/A] Wound Location: [2:Pressure Injury] [N/A:N/A] Wounding Event: [2:Pressure Ulcer] [N/A:N/A] Primary Etiology: [2:Cataracts, Coronary Artery Disease, N/A] Comorbid History: [2:Hypertension, Myocardial Infarction, Osteoarthritis, Osteomyelitis 05/18/2019] [N/A:N/A] Date Acquired: [2:4] [N/A:N/A] Weeks of Treatment: [2:Open] [N/A:N/A] Wound Status: [  2:1.7x1x1.7] [N/A:N/A] Measurements L x W x D (cm) [2:1.335] [N/A:N/A] A (cm) : rea [2:2.27] [N/A:N/A] Volume (cm) : [2:10.50%] [N/A:N/A] % Reduction in A [2:rea: -1.40%] [N/A:N/A] % Reduction in Volume: [2:12] Position 1 (o'clock): [2:3] Maximum Distance 1 (cm): [2:Yes] [N/A:N/A] Tunneling: [2:Category/Stage IV] [N/A:N/A] Classification: [2:Medium] [N/A:N/A] Exudate A mount: [2:Serous] [N/A:N/A] Exudate Type: [2:amber] [N/A:N/A] Exudate Color: [2:Epibole] [N/A:N/A] Wound Margin: [2:Large (67-100%)] [N/A:N/A] Granulation A mount: [2:Red] [N/A:N/A] Granulation Quality: [2:None Present (0%)] [N/A:N/A] Necrotic A mount: [2:Fat Layer (Subcutaneous Tissue): Yes N/A] Exposed Structures: [2:Fascia: No Tendon: No Muscle: No Joint: No Bone: No None] [N/A:N/A] Epithelialization: [2:Debridement - Excisional] [N/A:N/A] Debridement: Pre-procedure Verification/Time Out 11:12 [N/A:N/A] Taken: [2:Lidocaine] [N/A:N/A] Pain Control: [2:Subcutaneous]  [N/A:N/A] Tissue Debrided: [2:Skin/Subcutaneous Tissue] [N/A:N/A] Level: [2:1.7] [N/A:N/A] Debridement A (sq cm): [2:rea Curette] [N/A:N/A] Instrument: [2:Minimum] [N/A:N/A] Bleeding: [2:Pressure] [N/A:N/A] Hemostasis A chieved: [2:0] [N/A:N/A] Procedural Pain: [2:0] [N/A:N/A] Post Procedural Pain: [2:Procedure was tolerated well] [N/A:N/A] Debridement Treatment Response: [2:1.7x1x1.7] [N/A:N/A] Post Debridement Measurements L x W x D (cm) [2:2.27] [N/A:N/A] Post Debridement Volume: (cm) [2:Debridement] [N/A:N/A] Treatment Notes Wound #2 (Trochanter) Wound Laterality: Left Cleanser Wound Cleanser Discharge Instruction: Cleanse the wound with wound cleanser prior to applying a clean dressing using gauze sponges, not tissue or cotton balls. Peri-Wound Care Zinc Oxide Ointment 30g tube Discharge Instruction: Apply Zinc Oxide to periwound with each dressing change Topical Primary Dressing Wound Hydrogel, 3 (oz), tube Discharge Instruction: moistened hydrogel gauze packed into wound lightly Secondary Dressing Woven Gauze Sponge, Non-Sterile 4x4 in Discharge Instruction: Apply over primary dressing as directed. ABD Pad, 5x9 Discharge Instruction: Apply over primary dressing as directed. Secured With 60M Medipore H Soft Cloth Surgical T 4 x 2 (in/yd) ape Discharge Instruction: Secure dressing with tape as directed. Compression Wrap Compression Stockings Add-Ons Electronic Signature(s) Signed: 08/01/2020 5:23:06 PM By: Rhae Hammock RN Signed: 08/01/2020 5:50:43 PM By: [name] MD Entered By: [name] on 08/01/2020 11:52:41 -------------------------------------------------------------------------------- Multi-Disciplinary Care Plan Details Patient Name: Date of Service: Ivan [name], Larina Earthly. 08/01/2020 10:00 A M Medical Record Number: [id number] Patient Account Number: [id number] Date of Birth/Sex: Treating RN: 11-Jun-1945 (75 y.o. [name], Lauren Primary  Care Lineth Thielke: [name] Other Clinician: Referring Asharia Lotter: Treating Tyce Delcid/Extender: Ronnie Derby, [name] in Treatment: 4 Active Inactive Orientation to the Wound Care Program Nursing Diagnoses: Knowledge deficit related to the wound healing center program Goals: Patient/caregiver will verbalize understanding of the [place] Date Initiated: 07/04/2020 Target Resolution Date: 07/17/2020 Goal Status: Active Interventions: Provide education on orientation to the wound center Notes: Wound/Skin Impairment Nursing Diagnoses: Impaired tissue integrity Knowledge deficit related to ulceration/compromised skin integrity Goals: Patient will have a decrease in wound volume by X% from date: (specify in notes) Date Initiated: 07/04/2020 Target Resolution Date: 07/21/2020 Goal Status: Active Patient/caregiver will verbalize understanding of skin care regimen Date Initiated: 07/04/2020 Target Resolution Date: 07/21/2020 Goal Status: Active Ulcer/skin breakdown will have a volume reduction of 30% by week 4 Date Initiated: 07/04/2020 Target Resolution Date: 07/21/2020 Goal Status: Active Interventions: Assess patient/caregiver ability to obtain necessary supplies Assess patient/caregiver ability to perform ulcer/skin care regimen upon admission and as needed Assess ulceration(s) every visit Provide education on ulcer and skin care Notes: Electronic Signature(s) Signed: 08/01/2020 5:23:06 PM By: Rhae Hammock RN Entered By: Rhae Hammock on 08/01/2020 11:17:33 -------------------------------------------------------------------------------- Pain Assessment Details Patient Name: Date of Service: Ivan [name], [name] 08/01/2020 10:00 A M Medical Record Number: [id number] Patient Account Number: [id number] Date of Birth/Sex: Treating RN: 30-May-1945 (74  y.o. [name] Primary Care Yessika Otte: [name] Other Clinician: Referring  Renelle Stegenga: Treating Stephano Arrants/Extender: Fonnie Jarvis in Treatment: 4 Active Problems Location of Pain Severity and Description of Pain Patient Has Paino Yes Site Locations Pain Location: Pain in Ulcers With Dressing Change: Yes Duration of the Pain. Constant / Intermittento Intermittent Rate the pain. Current Pain Level: 0 Worst Pain Level: 5 Character of Pain Describe the Pain: Aching, Other: sore Pain Management and Medication Current Pain Management: Medication: Yes Is the Current Pain Management Adequate: Adequate How does your wound impact your activities of daily livingo Sleep: No Bathing: No Appetite: No Relationship With Others: No Bladder Continence: No Emotions: No Bowel Continence: No Work: No Toileting: No Drive: No Dressing: No Hobbies: No Engineer, maintenance) Signed: 08/01/2020 5:27:17 PM By: Baruch Gouty RN, BSN Entered By: Baruch Gouty on 08/01/2020 10:22:55 -------------------------------------------------------------------------------- Patient/Caregiver Education Details Patient Name: Date of Service: [place], [name] 3/22/2022andnbsp10:00 A M Medical Record Number: [id number] Patient Account Number: [id number] Date of Birth/Gender: Treating RN: 03/29/1946 (75 y.o. [name] Primary Care Physician: [name] Other Clinician: Referring Physician: Treating Physician/Extender: Fonnie Jarvis in Treatment: 4 Education Assessment Education Provided To: Patient Education Topics Provided Welcome T [facility] [place]: o Methods: Explain/Verbal Responses: State content correctly Wound/Skin Impairment: Responses: Reinforcements needed Electronic Signature(s) Signed: 08/01/2020 5:23:06 PM By: Rhae Hammock RN Entered By: Rhae Hammock on 08/01/2020 11:17:48 -------------------------------------------------------------------------------- Wound Assessment  Details Patient Name: Date of Service: Ivan [name], [name] 08/01/2020 10:00 A M Medical Record Number: [id number] Patient Account Number: [id number] Date of Birth/Sex: Treating RN: 1945/06/21 (75 y.o. [name] Primary Care Roda Lauture: [name] Other Clinician: Referring Samayra Hebel: Treating Shifra Swartzentruber/Extender: Ronnie Derby, [name] in Treatment: 4 Wound Status Wound Number: 2 Primary Pressure Ulcer Etiology: Wound Location: Left Trochanter Wound Open Wounding Event: Pressure Injury Status: Date Acquired: 05/18/2019 Comorbid Cataracts, Coronary Artery Disease, Hypertension, Myocardial Weeks Of Treatment: 4 History: Infarction, Osteoarthritis, Osteomyelitis Clustered Wound: No Photos Wound Measurements Length: (cm) 1.7 Width: (cm) 1 Depth: (cm) 1.7 Area: (cm) 1.335 Volume: (cm) 2.27 % Reduction in Area: 10.5% % Reduction in Volume: -1.4% Epithelialization: None Tunneling: Yes Position (o'clock): 12 Maximum Distance: (cm) 3 Undermining: No Wound Description Classification: Category/Stage IV Wound Margin: Epibole Exudate Amount: Medium Exudate Type: Serous Exudate Color: amber Foul Odor After Cleansing: No Wound Bed Granulation Amount: Large (67-100%) Exposed Structure Granulation Quality: Red Fascia Exposed: No Necrotic Amount: None Present (0%) Fat Layer (Subcutaneous Tissue) Exposed: Yes Tendon Exposed: No Muscle Exposed: No Joint Exposed: No Bone Exposed: No Treatment Notes Wound #2 (Trochanter) Wound Laterality: Left Cleanser Wound Cleanser Discharge Instruction: Cleanse the wound with wound cleanser prior to applying a clean dressing using gauze sponges, not tissue or cotton balls. Peri-Wound Care Zinc Oxide Ointment 30g tube Discharge Instruction: Apply Zinc Oxide to periwound with each dressing change Topical Primary Dressing Wound Hydrogel, 3 (oz), tube Discharge Instruction: moistened hydrogel gauze packed into wound  lightly Secondary Dressing Woven Gauze Sponge, Non-Sterile 4x4 in Discharge Instruction: Apply over primary dressing as directed. ABD Pad, 5x9 Discharge Instruction: Apply over primary dressing as directed. Secured With 89M Medipore H Soft Cloth Surgical T 4 x 2 (in/yd) ape Discharge Instruction: Secure dressing with tape as directed. Compression Wrap Compression Stockings Add-Ons Electronic Signature(s) Signed: 08/01/2020 5:27:17 PM By: Baruch Gouty RN, BSN Signed: 08/02/2020 7:45:09 AM By: Sandre Kitty Entered By: Sandre Kitty on 08/01/2020 16:02:26 -------------------------------------------------------------------------------- Vitals Details Patient Name: Date  of Service: Ivan [name]. 08/01/2020 10:00 A M Medical Record Number: [id number] Patient Account Number: [id number] Date of Birth/Sex: Treating RN: 24-Mar-1946 (75 y.o. [name] Primary Care Ventura Hollenbeck: [name] Other Clinician: Referring Damiah Mcdonald: Treating Tenoch Mcclure/Extender: Ronnie Derby, [name] in Treatment: 4 Vital Signs Time Taken: 10:19 Temperature (F): 98.3 Height (in): 70 Pulse (bpm): 76 Source: Stated Respiratory Rate (breaths/min): 18 Weight (lbs): 209 Blood Pressure (mmHg): 161/86 Source: Stated Reference Range: 80 - 120 mg / dl Body Mass Index (BMI): 30 Electronic Signature(s) Signed: 08/01/2020 5:27:17 PM By: Baruch Gouty RN, BSN Entered By: Baruch Gouty on 08/01/2020 10:20:10

## 2020-08-03 ENCOUNTER — Telehealth: Payer: Self-pay | Admitting: Cardiovascular Disease

## 2020-08-03 NOTE — Telephone Encounter (Signed)
   [facility] Pre-operative Risk Assessment    HEARTCARE STAFF: - Please ensure there is not already an duplicate clearance open for this procedure. - Under Visit Info/Reason for Call, type in Other and utilize the format Clearance MM/DD/YY or Clearance TBD. Do not use dashes or single digits. - If request is for dental extraction, please clarify the # of teeth to be extracted.  Request for surgical clearance:  1. What type of surgery is being performed? Bladder Biopsy and possible TURBT  2. When is this surgery scheduled? 08/15/20  3. What type of clearance is required (medical clearance vs. Pharmacy clearance to hold med vs. Both)? medical  4. Are there any medications that need to be held prior to surgery and how long?PCP gave clearance for pt to stay on baby aspirin.   5. Practice name and name of physician performing surgery? Arnette Schaumann, [facility]    6. What is the office phone number? [phone]   7.   What is the office fax number? [phone]  8.   Anesthesia type (None, local, MAC, general) ? General    Johnna Acosta 08/03/2020, 10:28 AM  _________________________________________________________________   (provider comments below)

## 2020-08-04 ENCOUNTER — Other Ambulatory Visit: Payer: Self-pay | Admitting: Urology

## 2020-08-04 NOTE — Telephone Encounter (Signed)
Pt has been scheduled to see Richardson Dopp, PA-C, 08/23/2020 for blood pressure medication titration.

## 2020-08-04 NOTE — Telephone Encounter (Signed)
   Primary Cardiologist: Mertie Moores, MD  Chart reviewed as part of pre-operative protocol coverage. Patient was contacted 08/04/2020 in reference to pre-operative risk assessment for pending surgery as outlined below.  Ivan [name] was last seen on 04/21/2020 by Gilberto Better PA-C.  Since that day, Ivan [name] has done well without any chest pain or shortness of breath.   Therefore, based on ACC/AHA guidelines, the patient would be at acceptable risk for the planned procedure without further cardiovascular testing.   The patient was advised that if he develops new symptoms prior to surgery to contact our office to arrange for a follow-up visit, and he verbalized understanding.  I will route this recommendation to the requesting party via Epic fax function and remove from pre-op pool. Please call with questions.  [place], [place] 08/04/2020, 3:24 PM

## 2020-08-04 NOTE — Telephone Encounter (Signed)
Please arrange a follow-up for this patient with Dr. Acie Fredrickson or his APP in the clinic after his urology procedure for blood pressure medication titration.  Note, patient is already cleared for the urology procedure.  Note, he recently came off of lisinopril due to angioedema.  Currently, his only blood pressure medications carvedilol.  Systolic blood pressure has been in the 120s to 150s range.  He likely will need a additional blood pressure medication.

## 2020-08-09 ENCOUNTER — Encounter (HOSPITAL_BASED_OUTPATIENT_CLINIC_OR_DEPARTMENT_OTHER): Payer: Self-pay | Admitting: Urology

## 2020-08-10 ENCOUNTER — Encounter (HOSPITAL_BASED_OUTPATIENT_CLINIC_OR_DEPARTMENT_OTHER): Payer: Self-pay | Admitting: Urology

## 2020-08-10 ENCOUNTER — Other Ambulatory Visit: Payer: Self-pay

## 2020-08-10 NOTE — Progress Notes (Addendum)
ADDENDUM:  Chart reviewed by anesthesia, Dr R. Fitzgerald MDA,  Ok to proceed barring any acute status change.   Spoke w/ via phone for pre-op interview--- PT Lab needs dos---- Istat and EKG              Lab results------ current cxr in epic/ chart COVID test ------ 08-12-2020 @ 0945  Arrive at ------- 1130 on 08-15-2020 NPO after MN NO Solid Food.  Clear liquids from MN until--- 1030 Med rec completed Medications to take morning of surgery ----- Coreg, Crestor Diabetic medication ----- n/a  Patient instructed to bring photo id and insurance card day of surgery Patient aware to have Driver (ride ) / caregiver    for 24 hours after surgery -- wife, Neoma Laming Patient Special Instructions ----- n/a  Pre-Op special Istructions ----- pt has pcp (note dated 07-19-2020) and telephone cardiac clearance (by Almyra Deforest PA on 08-04-2020) in epic/ chart Patient verbalized understanding of instructions that were given at this phone interview. Patient denies shortness of breath, chest pain, fever, cough at this phone interview.   Anesthesia Review:  HTN;  CAD w/ hx STEMI 2005 and 2016,  S/p angioplasty/ CABG 2005 and s/p staged angioplasty, unable to completed second stage, pt does have severe cad w/ known residual  RPDA;  Hx exposure to TB , positive blood test (09/ 2021 epic)but cxr negative (10/ 2021 epic);  Hx CVA no residual intraop cath 06/ 2016;  Narcolepsy;  Left hip open decubitus ulcer post covid;   Pt denies any cardiac s&s and no sob with any activity.  Pt stated has not taken a nitro since 2016.    PCP:  Dr Clementeen Graham (lov 07-19-2020 epic) Cardiologist :  Dr Cathie Olden (lov 04-21-2020 epic) [place]:  lov 07-04-2020 epic ID:  Dr Page Spiro (lov 04-14-2020 epic)  Chest x-ray :  02-13-2020 epic EKG :  07-26-2019  epic Echo :  09-15-2019 epic Stress test:  no Cardiac Cath : 10-13-2014 epic Activity level:  See above Sleep Study/ CPAP :  NO Fasting Blood Sugar :      / Checks Blood  Sugar -- times a day:  n/a Blood Thinner/ Instructions Maryjane Hurter Dose: NO ASA / Instructions/ Last Dose : ASA 81mg /  Pt stated told by cardiology can stop asa prior to surgery . Last dose 08-09-2020.

## 2020-08-12 ENCOUNTER — Other Ambulatory Visit (HOSPITAL_COMMUNITY)
Admission: RE | Admit: 2020-08-12 | Discharge: 2020-08-12 | Disposition: A | Discharge status: Home / Self Care | Payer: Medicare HMO | Source: Ambulatory Visit | Attending: Urology | Admitting: Urology

## 2020-08-12 DIAGNOSIS — Z01812 Encounter for preprocedural laboratory examination: Secondary | ICD-10-CM | POA: Insufficient documentation

## 2020-08-12 DIAGNOSIS — Z20822 Contact with and (suspected) exposure to covid-19: Secondary | ICD-10-CM | POA: Insufficient documentation

## 2020-08-12 LAB — SARS CORONAVIRUS 2 (TAT 6-24 HRS): SARS Coronavirus 2: NEGATIVE

## 2020-08-14 NOTE — H&P (Signed)
CC/HPI: cc: ED and lower urinary tract symptoms   05/19/19: 75 year old man comes in with lower urinary tract symptoms including intermittency, weak stream and nocturia. He was previously managed with Proscar but recently has been started on daily Cialis 10 mg for both ED and lower urinary tract symptoms. Patient had COVID in January 2021 in was hospitalized/SNF for 4 months. 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 He was placed on Cymbalta for depression however had difficulty with erectile dysfunction and was taken off of that. Recently he was started on testosterone injections every 2 weeks for low testosterone. He has not had testosterone levels checked approximately 3 months. This is managed by his primary care physician. He saw Dr. Gaynelle Arabian years ago.   IPSS: 2,0,3,2,5,3,217/35  QOL 3/6 mixed   07/06/20: Here for cysto, flowrate, PVR     ALLERGIES: Vancomycin    MEDICATIONS: Cialis 10 mg tablet  Crestor 10 mg tablet  Lisinopril 40 mg tablet  Adderall  Aspirin Ec 81 mg tablet, delayed release Oral  Coreg 12.5 mg tablet  Testosterone  Tramadol Hcl 50 mg tablet  Xanax 2 mg tablet     GU PSH: Complex Uroflow - 2017 Cystoscopy - 2017 Vasectomy - 2017       PSH Notes: Appendectomy, Cervical Vertebral Fusion, Surgery Vas Deferens Vasectomy, CABG   NON-GU PSH: Appendectomy - 2017 CABG (coronary artery bypass grafting) - 2017 Neck Spine Fusion - 2017     GU PMH: BPH w/LUTS, Patient would like to see how 10mg  daily cialis goes for him. He has not yet tried flomax. - 05/18/2020, Benign localized hyperplasia of prostate with urinary obstruction, - 2017 ED due to arterial insufficiency, Patient to continue to try daily cialis 10mg  for a few more weeks. We discussed increasing dose and using PRN if necessary. - 05/18/2020 Primary hypogonadism, Will repeat lab work today as he has been on testosterone replacement for a few weeks. Will  also check PSA, H&H. - 05/18/2020 Urinary Frequency - 05/18/2020, - 2017    NON-GU PMH: Encounter for general adult medical examination without abnormal findings, Encounter for preventive health examination - 2017 Myocardial Infarction, History of acute myocardial infarction - 2017 Personal history of other diseases of the circulatory system, History of cardiac disorder - 2017, History of hypertension, - 2017 Personal history of other endocrine, nutritional and metabolic disease, History of hypercholesterolemia - 2017 Arthritis Depression Heart disease, unspecified Hypercholesterolemia Hypertension Skin Cancer, History    FAMILY HISTORY: Deceased - Runs In Family Kidney Stones - Runs In Family malignant neoplasm of prostate - Runs In Family   SOCIAL HISTORY: Marital Status: Married Preferred Language: English; Ethnicity: Not Hispanic Or Latino; Race: White Current Smoking Status: Patient does not smoke anymore. Has not smoked since 10/12/2014. Smoked for 40 years. Smoked 1 pack per day.  Drinks 2 drinks per day.  Drinks 3 caffeinated drinks per day. Patient's occupation Psychologist, educational.     Notes: Former smoker, Caffeine use, Occupation, Married, Alcohol use, Number of children, Former smoker   REVIEW OF SYSTEMS:    GU Review Male:   Patient denies frequent urination, hard to postpone urination, burning/ pain with urination, get up at night to urinate, leakage of urine, stream starts and stops, trouble starting your stream, have to strain to urinate , erection problems, and penile pain.  Gastrointestinal (Upper):   Patient denies nausea, vomiting, and indigestion/ heartburn.  Gastrointestinal (Lower):   Patient  denies diarrhea and constipation.  Constitutional:   Patient denies fever, night sweats, weight loss, and fatigue.  Skin:   Patient denies skin rash/ lesion and itching.  Eyes:   Patient denies blurred vision and double vision.  Ears/ Nose/ Throat:   Patient denies sore throat and  sinus problems.  Hematologic/Lymphatic:   Patient denies swollen glands and easy bruising.  Cardiovascular:   Patient denies leg swelling and chest pains.  Respiratory:   Patient denies cough and shortness of breath.  Endocrine:   Patient denies excessive thirst.  Musculoskeletal:   Patient denies back pain and joint pain.  Neurological:   Patient denies headaches and dizziness.  Psychologic:   Patient denies depression and anxiety.   VITAL SIGNS: None   MULTI-SYSTEM PHYSICAL EXAMINATION:    Constitutional: Well-nourished. No physical deformities. Normally developed. Good grooming.  Neck: Neck symmetrical, not swollen. Normal tracheal position.  Respiratory: No labored breathing, no use of accessory muscles.   Skin: No paleness, no jaundice, no cyanosis. No lesion, no ulcer, no rash.  Neurologic / Psychiatric: Oriented to time, oriented to place, oriented to person. No depression, no anxiety, no agitation.  Gastrointestinal: No rigidity, non obese abdomen.   Eyes: Normal conjunctivae. Normal eyelids.  Ears, Nose, Mouth, and Throat: Left ear no scars, no lesions, no masses. Right ear no scars, no lesions, no masses. Nose no scars, no lesions, no masses. Normal hearing. Normal lips.  Musculoskeletal: Normal gait and station of head and neck.     Complexity of Data:  Records Review:   Previous Patient Records, POC Tool  Urine Test Review:   Urinalysis  Urodynamics Review:   Review Bladder Scan, Review Flow Rate   05/18/20 08/24/15  PSA  Total PSA 2.33 ng/mL 1.88     PROCEDURES:         Flexible Cystoscopy - 52000  Risks, benefits, and some of the potential complications of the procedure were discussed at length with the patient including infection, bleeding, voiding discomfort, urinary retention, fever, chills, sepsis, and others. All questions were answered. Informed consent was obtained. Sterile technique and intraurethral analgesia were used.  Meatus:  Normal size. Normal location.  Normal condition.  Urethra:  No strictures.  External Sphincter:  Normal.  Verumontanum:  Normal.  Prostate:  moderately obstructing lateral lobes  Bladder Neck:  Non-obstructing.  Ureteral Orifices:  Normal location. Normal size. Normal shape. Effluxed clear urine.  Bladder:  Patchy flat erythema most prominent right superior/dome      The lower urinary tract was carefully examined. The procedure was well-tolerated and without complications. Instructions were given to call the office immediately for bloody urine, difficulty urinating, urinary retention, painful or frequent urination, fever, chills, nausea, vomiting or other illness. The patient stated that he understood these instructions and would comply with them.        Flow Rate - 51741  Voided Volume: 172 cc  Peak Flow Rate: 7.9 cc/sec  Time of Peak Flow: 0:08 min:sec  Average Flow Rate: 5.0 cc/sec  Flow Time: 0:34 min:sec  Total Void Time: 0:34 min:sec           PVR Ultrasound - 92426  Scanned Volume: 30 cc         Urinalysis w/Scope Dipstick Dipstick Cont'd Micro  Color: Yellow Bilirubin: Neg mg/dL WBC/hpf: 10 - 20/hpf  Appearance: Clear Ketones: Trace mg/dL RBC/hpf: 0 - 2/hpf  Specific Gravity: 1.025 Blood: Neg ery/uL Bacteria: Mod (26-50/hpf)  pH: 6.0 Protein: Trace mg/dL Cystals: NS (Not  Seen)  Glucose: Neg mg/dL Urobilinogen: 0.2 mg/dL Casts: NS (Not Seen)    Nitrites: Neg Trichomonas: Not Present    Leukocyte Esterase: 1+ leu/uL Mucous: Present      Epithelial Cells: 6 - 10/hpf      Yeast: NS (Not Seen)      Sperm: Not Present    ASSESSMENT:      ICD-10 Details  1 GU:   BPH w/LUTS - N40.1 Chronic, Stable - Patient with evidence of BPH on cystoscopy and flow rate consistent with obstruction. Given cystoscopy findings of abnormal bladder mucosa will proceed with bladder biopsy prior to addressing enlarged prostate.  2   Primary hypogonadism - E29.1 Chronic, Stable - Patient's testosterone replacement is managed  by Dr. Royce Macadamia. I did check testosterone labs which were supratherapeutic at last visit. I recommended he space out injections to every 3 weeks instead of every 2 weeks and also Dr. Dr. Royce Macadamia about this.  3   Urinary Frequency - R35.0 Chronic, Stable  4   Bladder tumor/neoplasm - D41.4 Undiagnosed New Problem - Discussed with patient cystoscopic findings. Patchy erythema on superior bladder wall concerning for possible neoplasm. Discussed risks and benefits of cystoscopy with bladder biopsy and fulguration including but not limited to bleeding, pain, need for foley, infection, damage to surrounding structures, bladder perofration, need for foley, need for additional treatment. Patient wishes to proceed and will be scheduled for surgery.

## 2020-08-15 ENCOUNTER — Ambulatory Visit (HOSPITAL_BASED_OUTPATIENT_CLINIC_OR_DEPARTMENT_OTHER): Payer: Medicare HMO | Admitting: Anesthesiology

## 2020-08-15 ENCOUNTER — Encounter (HOSPITAL_BASED_OUTPATIENT_CLINIC_OR_DEPARTMENT_OTHER)
Admission: RE | Disposition: A | Discharge status: Home / Self Care | Payer: Self-pay | Source: Home / Self Care | Attending: Urology

## 2020-08-15 ENCOUNTER — Encounter (HOSPITAL_BASED_OUTPATIENT_CLINIC_OR_DEPARTMENT_OTHER): Payer: Self-pay | Admitting: Urology

## 2020-08-15 ENCOUNTER — Ambulatory Visit (HOSPITAL_BASED_OUTPATIENT_CLINIC_OR_DEPARTMENT_OTHER)
Admission: RE | Admit: 2020-08-15 | Discharge: 2020-08-15 | Disposition: A | Discharge status: Home / Self Care | Payer: Medicare HMO | Attending: Urology | Admitting: Urology

## 2020-08-15 ENCOUNTER — Other Ambulatory Visit: Payer: Self-pay

## 2020-08-15 DIAGNOSIS — Z8042 Family history of malignant neoplasm of prostate: Secondary | ICD-10-CM | POA: Insufficient documentation

## 2020-08-15 DIAGNOSIS — N401 Enlarged prostate with lower urinary tract symptoms: Secondary | ICD-10-CM | POA: Insufficient documentation

## 2020-08-15 DIAGNOSIS — Z881 Allergy status to other antibiotic agents status: Secondary | ICD-10-CM | POA: Diagnosis not present

## 2020-08-15 DIAGNOSIS — I119 Hypertensive heart disease without heart failure: Secondary | ICD-10-CM | POA: Insufficient documentation

## 2020-08-15 DIAGNOSIS — I10 Essential (primary) hypertension: Secondary | ICD-10-CM | POA: Insufficient documentation

## 2020-08-15 DIAGNOSIS — D494 Neoplasm of unspecified behavior of bladder: Secondary | ICD-10-CM | POA: Diagnosis not present

## 2020-08-15 DIAGNOSIS — Z87891 Personal history of nicotine dependence: Secondary | ICD-10-CM | POA: Insufficient documentation

## 2020-08-15 DIAGNOSIS — Z951 Presence of aortocoronary bypass graft: Secondary | ICD-10-CM | POA: Diagnosis not present

## 2020-08-15 DIAGNOSIS — Z85828 Personal history of other malignant neoplasm of skin: Secondary | ICD-10-CM | POA: Diagnosis not present

## 2020-08-15 DIAGNOSIS — I252 Old myocardial infarction: Secondary | ICD-10-CM | POA: Diagnosis not present

## 2020-08-15 DIAGNOSIS — E291 Testicular hypofunction: Secondary | ICD-10-CM | POA: Diagnosis not present

## 2020-08-15 DIAGNOSIS — Z9852 Vasectomy status: Secondary | ICD-10-CM | POA: Insufficient documentation

## 2020-08-15 DIAGNOSIS — N3090 Cystitis, unspecified without hematuria: Secondary | ICD-10-CM | POA: Diagnosis not present

## 2020-08-15 HISTORY — DX: Coronary angioplasty status: Z98.61

## 2020-08-15 HISTORY — DX: Presence of spectacles and contact lenses: Z97.3

## 2020-08-15 HISTORY — DX: Other specified disorders of veins: I87.8

## 2020-08-15 HISTORY — DX: Localized edema: R60.0

## 2020-08-15 HISTORY — DX: Anesthesia of skin: R20.0

## 2020-08-15 HISTORY — DX: Polyneuropathy, unspecified: G62.9

## 2020-08-15 HISTORY — DX: Other intervertebral disc degeneration, lumbosacral region: M51.37

## 2020-08-15 HISTORY — DX: Old myocardial infarction: I25.2

## 2020-08-15 HISTORY — DX: Other intervertebral disc degeneration, lumbosacral region without mention of lumbar back pain or lower extremity pain: M51.379

## 2020-08-15 HISTORY — DX: Benign prostatic hyperplasia with lower urinary tract symptoms: N40.1

## 2020-08-15 HISTORY — DX: Prediabetes: R73.03

## 2020-08-15 HISTORY — DX: Personal history of other diseases of the musculoskeletal system and connective tissue: Z87.39

## 2020-08-15 HISTORY — PX: CYSTOSCOPY WITH BIOPSY: SHX5122

## 2020-08-15 HISTORY — DX: Personal history of malignant melanoma of skin: Z85.820

## 2020-08-15 HISTORY — DX: Benign prostatic hyperplasia without lower urinary tract symptoms: N40.0

## 2020-08-15 HISTORY — DX: Bladder disorder, unspecified: N32.9

## 2020-08-15 LAB — POCT I-STAT, CHEM 8
BUN: 24 mg/dL — ABNORMAL HIGH (ref 8–23)
Calcium, Ion: 1.25 mmol/L (ref 1.15–1.40)
Chloride: 104 mmol/L (ref 98–111)
Creatinine, Ser: 0.7 mg/dL (ref 0.61–1.24)
Glucose, Bld: 101 mg/dL — ABNORMAL HIGH (ref 70–99)
HCT: 51 % (ref 39.0–52.0)
Hemoglobin: 17.3 g/dL — ABNORMAL HIGH (ref 13.0–17.0)
Potassium: 4.1 mmol/L (ref 3.5–5.1)
Sodium: 142 mmol/L (ref 135–145)
TCO2: 26 mmol/L (ref 22–32)

## 2020-08-15 SURGERY — CYSTOSCOPY, WITH BIOPSY
Anesthesia: General | Site: Bladder

## 2020-08-15 MED ORDER — ACETAMINOPHEN 500 MG PO TABS
ORAL_TABLET | ORAL | Status: AC
  Filled 2020-08-15: qty 2

## 2020-08-15 MED ORDER — CIPROFLOXACIN IN D5W 400 MG/200ML IV SOLN
400.0000 mg | Freq: Two times a day (BID) | INTRAVENOUS | Status: DC
  Administered 2020-08-15: 400 mg via INTRAVENOUS

## 2020-08-15 MED ORDER — FENTANYL CITRATE (PF) 100 MCG/2ML IJ SOLN
INTRAMUSCULAR | Status: DC | PRN
  Administered 2020-08-15 (×4): 25 ug via INTRAVENOUS

## 2020-08-15 MED ORDER — PROPOFOL 10 MG/ML IV BOLUS
INTRAVENOUS | Status: DC | PRN
  Administered 2020-08-15: 50 mg via INTRAVENOUS
  Administered 2020-08-15: 150 mg via INTRAVENOUS
  Administered 2020-08-15: 50 mg via INTRAVENOUS

## 2020-08-15 MED ORDER — LACTATED RINGERS IV SOLN
INTRAVENOUS | Status: DC
  Administered 2020-08-15: 1000 mL via INTRAVENOUS

## 2020-08-15 MED ORDER — ONDANSETRON HCL 4 MG/2ML IJ SOLN
INTRAMUSCULAR | Status: AC
  Filled 2020-08-15: qty 2

## 2020-08-15 MED ORDER — FENTANYL CITRATE (PF) 100 MCG/2ML IJ SOLN
INTRAMUSCULAR | Status: AC
  Filled 2020-08-15: qty 2

## 2020-08-15 MED ORDER — PROPOFOL 10 MG/ML IV BOLUS
INTRAVENOUS | Status: AC
  Filled 2020-08-15: qty 40

## 2020-08-15 MED ORDER — ONDANSETRON HCL 4 MG/2ML IJ SOLN
INTRAMUSCULAR | Status: DC | PRN
  Administered 2020-08-15: 4 mg via INTRAVENOUS

## 2020-08-15 MED ORDER — DEXAMETHASONE SODIUM PHOSPHATE 4 MG/ML IJ SOLN
INTRAMUSCULAR | Status: DC | PRN
  Administered 2020-08-15: 5 mg via INTRAVENOUS

## 2020-08-15 MED ORDER — LIDOCAINE 2% (20 MG/ML) 5 ML SYRINGE
INTRAMUSCULAR | Status: AC
  Filled 2020-08-15: qty 5

## 2020-08-15 MED ORDER — STERILE WATER FOR IRRIGATION IR SOLN
Status: DC | PRN
  Administered 2020-08-15: 2500 mL

## 2020-08-15 MED ORDER — LIDOCAINE 2% (20 MG/ML) 5 ML SYRINGE
INTRAMUSCULAR | Status: DC | PRN
  Administered 2020-08-15: 60 mg via INTRAVENOUS

## 2020-08-15 MED ORDER — ACETAMINOPHEN 500 MG PO TABS
1000.0000 mg | ORAL_TABLET | Freq: Once | ORAL | Status: AC
  Administered 2020-08-15: 1000 mg via ORAL

## 2020-08-15 MED ORDER — CIPROFLOXACIN IN D5W 400 MG/200ML IV SOLN
INTRAVENOUS | Status: AC
  Filled 2020-08-15: qty 200

## 2020-08-15 SURGICAL SUPPLY — 18 items
BAG DRAIN URO-CYSTO SKYTR STRL (DRAIN) ×3 IMPLANT
BAG DRN RND TRDRP ANRFLXCHMBR (UROLOGICAL SUPPLIES)
BAG DRN UROCATH (DRAIN) ×2
BAG URINE DRAIN 2000ML AR STRL (UROLOGICAL SUPPLIES) IMPLANT
CLOTH BEACON ORANGE TIMEOUT ST (SAFETY) ×3 IMPLANT
ELECT REM PT RETURN 9FT ADLT (ELECTROSURGICAL)
ELECTRODE REM PT RTRN 9FT ADLT (ELECTROSURGICAL) ×1 IMPLANT
GLOVE SURG ENC MOIS LTX SZ6.5 (GLOVE) ×3 IMPLANT
GLOVE SURG POLYISO LF SZ6.5 (GLOVE) ×2 IMPLANT
GOWN STRL REUS W/TWL LRG LVL3 (GOWN DISPOSABLE) ×5 IMPLANT
IV NS IRRIG 3000ML ARTHROMATIC (IV SOLUTION) ×2 IMPLANT
KIT TURNOVER CYSTO (KITS) ×3 IMPLANT
LOOP CUT BIPOLAR 24F LRG (ELECTROSURGICAL) ×2 IMPLANT
MANIFOLD NEPTUNE II (INSTRUMENTS) ×3 IMPLANT
PACK CYSTO (CUSTOM PROCEDURE TRAY) ×3 IMPLANT
TUBE CONNECTING 12X1/4 (SUCTIONS) ×3 IMPLANT
TUBING UROLOGY SET (TUBING) ×3 IMPLANT
WATER STERILE IRR 3000ML UROMA (IV SOLUTION) ×2 IMPLANT

## 2020-08-15 NOTE — Anesthesia Preprocedure Evaluation (Addendum)
Anesthesia Evaluation  Patient identified by MRN, date of birth, ID band Patient awake    Reviewed: Allergy & Precautions, NPO status , Patient's Chart, lab work & pertinent test results  Airway Mallampati: II  TM Distance: >3 FB Neck ROM: Full    Dental no notable dental hx.    Pulmonary former smoker,    Pulmonary exam normal breath sounds clear to auscultation       Cardiovascular hypertension, Pt. on home beta blockers + CAD, + Past MI and + CABG  Normal cardiovascular exam Rhythm:Regular Rate:Normal  ECHO: 1. Left ventricular ejection fraction, by estimation, is 55 to 60%. The left ventricle has normal function. The left ventricle has no regional wall motion abnormalities. There is mild left ventricular hypertrophy. Left ventricular diastolic parameters were normal. 2. Right ventricular systolic function is normal. The right ventricular size is normal. 3. The mitral valve is normal in structure. Trivial mitral valve regurgitation. No evidence of mitral stenosis. 4. The aortic valve is normal in structure. Aortic valve regurgitation is not visualized. No aortic stenosis is present.   Neuro/Psych PSYCHIATRIC DISORDERS Anxiety Depression PTSD (post-traumatic stress disorder CVA    GI/Hepatic negative GI ROS, Neg liver ROS,   Endo/Other  negative endocrine ROS  Renal/GU negative Renal ROS     Musculoskeletal  (+) Arthritis ,   Abdominal   Peds  Hematology HLD    Anesthesia Other Findings ABNORMAL BLADDER LESION  Reproductive/Obstetrics                            Anesthesia Physical Anesthesia Plan  ASA: III  Anesthesia Plan: General   Post-op Pain Management:    Induction: Intravenous  PONV Risk Score and Plan: 3 and Ondansetron, Dexamethasone and Treatment may vary due to age or medical condition  Airway Management Planned: LMA  Additional Equipment:   Intra-op Plan:    Post-operative Plan: Extubation in OR  Informed Consent: I have reviewed the patients History and Physical, chart, labs and discussed the procedure including the risks, benefits and alternatives for the proposed anesthesia with the patient or authorized representative who has indicated his/her understanding and acceptance.     Dental advisory given  Plan Discussed with: CRNA  Anesthesia Plan Comments:        Anesthesia Quick Evaluation

## 2020-08-15 NOTE — Transfer of Care (Signed)
Immediate Anesthesia Transfer of Care Note  Patient: Ivan [name]  Procedure(s) Performed: Procedure(s) (LRB): CYSTOSCOPY WITH BIOPSY WITH FULGARATION (N/A)  Patient Location: PACU  Anesthesia Type: General  Level of Consciousness: awake, sedated, patient cooperative and responds to stimulation  Airway & Oxygen Therapy: Patient Spontanous Breathing and Patient connected to [place] and soft FM   Post-op Assessment: Report given to PACU RN, Post -op Vital signs reviewed and stable and Patient moving all extremities  Post vital signs: Reviewed and stable  Complications: No apparent anesthesia complications

## 2020-08-15 NOTE — Op Note (Signed)
PATIENT:  Ivan [name]  PRE-OPERATIVE DIAGNOSIS: Bladder tumor  POST-OPERATIVE DIAGNOSIS: Same  PROCEDURE:  Procedure(s): 1. Cystoscopy with bladder biopsy x 3 2. Fulguration  SURGEON:  Jacalyn Lefevre, MD  ANESTHESIA:   General  EBL:  Minimal  DRAINS: none  SPECIMEN:  Bladder tumor  DISPOSITION OF SPECIMEN:  PATHOLOGY  Indication: 75 year old man with a history of BPH with lower urinary tract symptoms underwent cystoscopy to evaluate prostate.  He was incidentally found to have 3 small nodular bladder tumors in the posterior bladder wall.  After the risks and benefits of the procedure were discussed patient decided to proceed with cystoscopy with bladder biopsy/TURBT.  Description of operation: The patient was taken to the operating room and administered general anesthesia. They were then placed on the table and moved to the dorsal lithotomy position after which the genitalia was sterilely prepped and draped. An official timeout was then performed.  The 39 French resectoscope with the 30 lens and visual obturator were then passed into the bladder under direct visualization. Urethra appeared normal. The visual obturator was then removed and the Gyrus resectoscope element with 30  lens was then inserted and the bladder was fully and systematically inspected. Ureteral orifices were noted to be in the normal anatomic positions.   There were 3 subcentimeter bladder tumors on the posterior bladder wall that appeared nodular.  Cold cup bladder biopsy was then used to take a biopsy of each of these areas.  Bugbee electrocautery was used for fulguration.  Hemostasis was deemed adequate.  Reinspection of the bladder revealed all obvious tumor had been fulgurated and there was no evidence of perforation.   The patient's bladder was decompressed and the cystoscope was removed.  The patient was awakened and taken to the recovery room.   PLAN OF CARE: Discharge to home after PACU  PATIENT  DISPOSITION:  PACU - hemodynamically stable.

## 2020-08-15 NOTE — Anesthesia Postprocedure Evaluation (Signed)
Anesthesia Post Note  Patient: Ivan [name]  Procedure(s) Performed: CYSTOSCOPY WITH BIOPSY WITH FULGARATION (N/A Bladder)     Patient location during evaluation: PACU Anesthesia Type: General Level of consciousness: awake Pain management: pain level controlled Vital Signs Assessment: post-procedure vital signs reviewed and stable Respiratory status: spontaneous breathing, nonlabored ventilation, respiratory function stable and patient connected to nasal cannula oxygen Cardiovascular status: blood pressure returned to baseline and stable Postop Assessment: no apparent nausea or vomiting Anesthetic complications: no   No complications documented.  Last Vitals:  Vitals:   08/15/20 1500 08/15/20 1540  BP: (!) 148/93 (!) 162/76  Pulse: 60 (!) 55  Resp: 20 17  Temp:  36.6 C  SpO2: 98% 99%    Last Pain:  Vitals:   08/15/20 1540  PainSc: 0-No pain                 Loriene Taunton P Emmons Toth

## 2020-08-15 NOTE — Discharge Instructions (Signed)
Post Bladder Surgery Instructions   General instructions:     Your recent bladder surgery requires very little post hospital care but some definite precautions.  Despite the fact that no skin incisions were used, the area around the bladder incisions are raw and covered with scabs to promote healing and prevent bleeding. Certain precautions are needed to insure that the scabs are not disturbed over the next 2-4 weeks while the healing proceeds.  Because the raw surface inside your bladder and the irritating effects of urine you may expect frequency of urination and/or urgency (a stronger desire to urinate) and perhaps even getting up at night more often. This will usually resolve or improve slowly over the healing period. You may see some blood in your urine over the first 6 weeks. Do not be alarmed, even if the urine was clear for a while. Get off your feet and drink lots of fluids until clearing occurs. If you start to pass clots or don't improve call us.   Diet:  You may return to your normal diet immediately. Because of the raw surface of your bladder, alcohol, spicy foods, foods high in acid and drinks with caffeine may cause irritation or frequency and should be used in moderation. To keep your urine flowing freely and avoid constipation, drink plenty of fluids during the day (8-10 glasses). Tip: Avoid cranberry juice because it is very acidic.  Activity:  Your physical activity doesn't need to be restricted. However, if you are very active, you may see some blood in the urine. We suggest that you reduce your activity under the circumstances until the bleeding has stopped.  Bowels:  It is important to keep your bowels regular during the postoperative period. Straining with bowel movements can cause bleeding. A bowel movement every other day is reasonable. Use a mild laxative if needed, such as milk of magnesia 2-3 tablespoons, or 2 Dulcolax tablets. Call if you continue to have problems.  If you had been taking narcotics for pain, before, during or after your surgery, you may be constipated. Take a laxative if necessary.    Medication:  You should resume your pre-surgery medications unless told not to. In addition you may be given an antibiotic to prevent or treat infection. Antibiotics are not always necessary. All medication should be taken as prescribed until the bottles are finished unless you are having an unusual reaction to one of the drugs.   Post Anesthesia Home Care Instructions  Activity: Get plenty of rest for the remainder of the day. A responsible individual must stay with you for 24 hours following the procedure.  For the next 24 hours, DO NOT: -Drive a car -Paediatric nurse -Drink alcoholic beverages -Take any medication unless instructed by your physician -Make any legal decisions or sign important papers.  Meals: Start with liquid foods such as gelatin or soup. Progress to regular foods as tolerated. Avoid greasy, spicy, heavy foods. If nausea and/or vomiting occur, drink only clear liquids until the nausea and/or vomiting subsides. Call your physician if vomiting continues.  Special Instructions/Symptoms: Your throat may feel dry or sore from the anesthesia or the breathing tube placed in your throat during surgery. If this causes discomfort, gargle with warm salt water. The discomfort should disappear within 24 hours.

## 2020-08-15 NOTE — Anesthesia Procedure Notes (Signed)
Procedure Name: LMA Insertion Date/Time: 08/15/2020 2:06 PM Performed by: Justice Rocher, CRNA Pre-anesthesia Checklist: Patient identified, Emergency Drugs available, Suction available, Patient being monitored and Timeout performed Patient Re-evaluated:Patient Re-evaluated prior to induction Oxygen Delivery Method: Circle system utilized Preoxygenation: Pre-oxygenation with 100% oxygen Induction Type: IV induction Ventilation: Mask ventilation without difficulty LMA: LMA inserted LMA Size: 5.0 Number of attempts: 1 Airway Equipment and Method: Bite block Placement Confirmation: positive ETCO2,  breath sounds checked- equal and bilateral and CO2 detector Tube secured with: Tape Dental Injury: Teeth and Oropharynx as per pre-operative assessment

## 2020-08-15 NOTE — Interval H&P Note (Signed)
History and Physical Interval Note:  08/15/2020 11:30 AM  Ivan [name]  has presented today for surgery, with the diagnosis of ABNORMAL BLADDER LESION.  The various methods of treatment have been discussed with the patient and family. After consideration of risks, benefits and other options for treatment, the patient has consented to  Procedure(s): CYSTOSCOPY WITH BIOPSY (N/A) TRANSURETHRAL RESECTION OF BLADDER TUMOR (TURBT) (N/A) as a surgical intervention.  The patient's history has been reviewed, patient examined, no change in status, stable for surgery.  I have reviewed the patient's chart and labs.  Questions were answered to the patient's satisfaction.     Branndon Tuite D Daphane Odekirk

## 2020-08-16 ENCOUNTER — Encounter (HOSPITAL_BASED_OUTPATIENT_CLINIC_OR_DEPARTMENT_OTHER): Payer: Self-pay | Admitting: Urology

## 2020-08-16 LAB — SURGICAL PATHOLOGY
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
## 2020-08-23 ENCOUNTER — Encounter: Payer: Self-pay | Admitting: Family Medicine

## 2020-08-23 ENCOUNTER — Other Ambulatory Visit: Payer: Self-pay

## 2020-08-23 ENCOUNTER — Encounter: Payer: Self-pay | Admitting: Physician Assistant

## 2020-08-23 ENCOUNTER — Ambulatory Visit: Payer: Medicare HMO | Admitting: Physician Assistant

## 2020-08-23 VITALS — BP 140/70 | HR 68 | Ht 70.0 in | Wt 198.4 lb

## 2020-08-23 DIAGNOSIS — E7800 Pure hypercholesterolemia, unspecified: Secondary | ICD-10-CM | POA: Diagnosis not present

## 2020-08-23 DIAGNOSIS — I10 Essential (primary) hypertension: Secondary | ICD-10-CM

## 2020-08-23 DIAGNOSIS — I2510 Atherosclerotic heart disease of native coronary artery without angina pectoris: Secondary | ICD-10-CM

## 2020-08-23 DIAGNOSIS — R3129 Other microscopic hematuria: Secondary | ICD-10-CM | POA: Insufficient documentation

## 2020-08-23 MED ORDER — AMLODIPINE BESYLATE 5 MG PO TABS: 5.0000 mg | ORAL_TABLET | Freq: Every day | ORAL | 11 refills | Status: DC

## 2020-08-23 MED ORDER — ROSUVASTATIN CALCIUM 20 MG PO TABS: 20.0000 mg | ORAL_TABLET | Freq: Every day | ORAL | 11 refills | Status: DC

## 2020-08-23 NOTE — Patient Instructions (Addendum)
Medication Instructions:  Your physician has recommended you make the following change in your medication:   1.  Increase rosuvastatin one tablet by mouth ( 20 mg) daily.  2.  Start Norvasc one tablet by mouth ( 5mg ) daily. Sent in today to requested pharmacy # [id number].  *If you need a refill on your cardiac medications before your next appointment, please call your pharmacy*   Lab Work: Your physician recommends that you return for a FASTING lipid profile/CMET/Wednesday, July 13 anytime between 7:30-4:30 fasting from midnight.   If you have labs (blood work) drawn today and your tests are completely normal, you will receive your results only by: Marland Kitchen MyChart Message (if you have MyChart) OR . A paper copy in the mail If you have any lab test that is abnormal or we need to change your treatment, we will call you to review the results.   Testing/Procedures: -None   Follow-Up: At [facility], you and your health needs are our priority.  As part of our continuing mission to provide you with exceptional heart care, we have created designated Provider Care Teams.  These Care Teams include your primary Cardiologist (physician) and Advanced Practice Providers (APPs -  Physician Assistants and Nurse Practitioners) who all work together to provide you with the care you need, when you need it.  We recommend signing up for the patient portal called "MyChart".  Sign up information is provided on this After Visit Summary.  MyChart is used to connect with patients for Virtual Visits (Telemedicine).  Patients are able to view lab/test results, encounter notes, upcoming appointments, etc.  Non-urgent messages can be sent to your provider as well.   To learn more about what you can do with MyChart, go to [email or web address].    Your next appointment:   6 month(s)  The format for your next appointment:   In Person  Provider:   You may see Mertie Moores, MD or one of the following Advanced Practice  Providers on your designated Care Team:    Richardson Dopp, PA-C     Other Instructions Your physician wants you to follow-up in: 6 month with Dr. Acie Fredrickson or Richardson Dopp, PA.  You will receive a reminder letter in the mail two months in advance. If you don't receive a letter, please call our office to schedule the follow-up appointment.

## 2020-08-24 ENCOUNTER — Telehealth: Payer: Self-pay | Admitting: Cardiovascular Disease

## 2020-08-24 MED ORDER — AMLODIPINE BESYLATE 5 MG PO TABS: 5.0000 mg | ORAL_TABLET | Freq: Every day | ORAL | 11 refills | Status: DC

## 2020-08-24 MED ORDER — ROSUVASTATIN CALCIUM 20 MG PO TABS: 20.0000 mg | ORAL_TABLET | Freq: Every day | ORAL | 11 refills | Status: DC

## 2020-08-24 NOTE — Telephone Encounter (Signed)
Pt's medications were sent to pt's pharmacy as requested. Confirmation received.  

## 2020-08-24 NOTE — Telephone Encounter (Signed)
*  STAT* If patient is at the pharmacy, call can be transferred to refill team.   1. Which medications need to be refilled? (please list name of each medication and dose if known)  amLODipine (NORVASC) 5 MG tablet rosuvastatin (CRESTOR) 20 MG tablet  2. Which pharmacy/location (including street and city if local pharmacy) is medication to be sent to? [id number] - [place], [place] - [address]  3. Do they need a 30 day or 90 day supply? 30 day supply  Patient states the medications have been sent to the incorrect pharmacy. He is hoping to have them transferred to the pharmacy listed above.

## 2020-08-29 ENCOUNTER — Encounter (HOSPITAL_BASED_OUTPATIENT_CLINIC_OR_DEPARTMENT_OTHER): Payer: Medicare HMO | Attending: Internal Medicine | Admitting: Internal Medicine

## 2020-10-05 ENCOUNTER — Other Ambulatory Visit: Payer: Self-pay | Admitting: Cardiovascular Disease

## 2020-10-24 ENCOUNTER — Encounter (HOSPITAL_BASED_OUTPATIENT_CLINIC_OR_DEPARTMENT_OTHER): Payer: Medicare HMO | Admitting: Internal Medicine

## 2020-10-24 ENCOUNTER — Other Ambulatory Visit: Payer: Self-pay

## 2020-10-24 ENCOUNTER — Encounter (HOSPITAL_BASED_OUTPATIENT_CLINIC_OR_DEPARTMENT_OTHER): Payer: Medicare HMO | Attending: Internal Medicine | Admitting: Internal Medicine

## 2020-10-24 DIAGNOSIS — L89224 Pressure ulcer of left hip, stage 4: Secondary | ICD-10-CM | POA: Insufficient documentation

## 2020-10-24 DIAGNOSIS — Z888 Allergy status to other drugs, medicaments and biological substances status: Secondary | ICD-10-CM | POA: Diagnosis not present

## 2020-10-24 DIAGNOSIS — Z881 Allergy status to other antibiotic agents status: Secondary | ICD-10-CM | POA: Diagnosis not present

## 2020-10-24 DIAGNOSIS — Z833 Family history of diabetes mellitus: Secondary | ICD-10-CM | POA: Diagnosis not present

## 2020-10-24 DIAGNOSIS — K6812 Psoas muscle abscess: Secondary | ICD-10-CM | POA: Insufficient documentation

## 2020-10-24 DIAGNOSIS — Z87891 Personal history of nicotine dependence: Secondary | ICD-10-CM | POA: Diagnosis not present

## 2020-10-24 DIAGNOSIS — L509 Urticaria, unspecified: Secondary | ICD-10-CM | POA: Insufficient documentation

## 2020-10-24 DIAGNOSIS — Z8616 Personal history of COVID-19: Secondary | ICD-10-CM | POA: Diagnosis not present

## 2020-10-24 NOTE — Progress Notes (Signed)
ISHAAN, VILLAMAR ([id number]) Visit Report for 10/24/2020 Abuse/Suicide Risk Screen Details Patient Name: Date of Service: Ivan [name]. 10/24/2020 9:00 A M Medical Record Number: [id number] Patient Account Number: [id number] Date of Birth/Sex: Treating RN: May 09, 1946 (75 y.o. Ivan [name] Primary Care Maikel Neisler: [name] Other Clinician: Referring Leopold Smyers: Treating Fanchon Papania/Extender: Ronnie Derby, [name] in Treatment: 0 Abuse/Suicide Risk Screen Items Answer ABUSE RISK SCREEN: Has anyone close to you tried to hurt or harm you recentlyo No Do you feel uncomfortable with anyone in your familyo No Has anyone forced you do things that you didnt want to doo No Electronic Signature(s) Signed: 10/24/2020 6:10:01 PM By: Baruch Gouty RN, BSN Entered By: Baruch Gouty on 10/24/2020 09:15:38 -------------------------------------------------------------------------------- Activities of Daily Living Details Patient Name: Date of Service: Ivan [name], Ivan BERT W. 10/24/2020 9:00 A M Medical Record Number: [id number] Patient Account Number: [id number] Date of Birth/Sex: Treating RN: 1946/01/04 (75 y.o. Ivan [name] Primary Care Helmuth Recupero: [name] Other Clinician: Referring Masha Orbach: Treating Cassiel Fernandez/Extender: Ronnie Derby, [name] in Treatment: 0 Activities of Daily Living Items Answer Activities of Daily Living (Please select one for each item) Drive Automobile Completely Able T Medications ake Completely Able Use T elephone Completely Able Care for Appearance Completely Able Use T oilet Completely Able Bath / Shower Completely Able Dress Self Completely Able Feed Self Completely Able Walk Completely Able Get In / Out Bed Completely Able Housework Completely Able Prepare Meals Completely Able Handle Money Completely Able Shop for Self Completely Able Electronic Signature(s) Signed: 10/24/2020 6:10:01 PM By: Baruch Gouty RN,  BSN Entered By: Baruch Gouty on 10/24/2020 09:16:00 -------------------------------------------------------------------------------- Education Screening Details Patient Name: Date of Service: Ivan [name], Ivan BERT W. 10/24/2020 9:00 A M Medical Record Number: [id number] Patient Account Number: [id number] Date of Birth/Sex: Treating RN: 08/05/1945 (75 y.o. Ivan [name] Primary Care Malvina Schadler: [name] Other Clinician: Referring Brilynn Biasi: Treating Ruie Sendejo/Extender: Fonnie Jarvis in Treatment: 0 Primary Learner Assessed: Patient Learning Preferences/Education Level/Primary Language Learning Preference: Explanation, Demonstration, Printed Material Highest Education Level: College or Above Preferred Language: English Cognitive Barrier Language Barrier: No Translator Needed: No Memory Deficit: No Emotional Barrier: No Cultural/Religious Beliefs Affecting Medical Care: No Physical Barrier Impaired Vision: Yes Glasses Impaired Hearing: No Decreased Hand dexterity: No Knowledge/Comprehension Knowledge Level: High Comprehension Level: High Ability to understand written instructions: High Ability to understand verbal instructions: High Motivation Anxiety Level: Calm Cooperation: Cooperative Education Importance: Acknowledges Need Interest in Health Problems: Asks Questions Perception: Coherent Willingness to Engage in Self-Management High Activities: Readiness to Engage in Self-Management High Activities: Electronic Signature(s) Signed: 10/24/2020 6:10:01 PM By: Baruch Gouty RN, BSN Entered By: Baruch Gouty on 10/24/2020 09:16:29 -------------------------------------------------------------------------------- Fall Risk Assessment Details Patient Name: Date of Service: Ivan [name], Ivan BERT W. 10/24/2020 9:00 A M Medical Record Number: [id number] Patient Account Number: [id number] Date of Birth/Sex: Treating RN: 01/31/1946 (75 y.o. Ivan [name] Primary Care Nala Kachel: [name] Other Clinician: Referring Shemaiah Round: Treating Amedio Bowlby/Extender: Ronnie Derby, [name] in Treatment: 0 Fall Risk Assessment Items Have you had 2 or more falls in the last 12 monthso 0 No Have you had any fall that resulted in injury in the last 12 monthso 0 No FALLS RISK SCREEN History of falling - immediate or within 3 months 0 No Secondary diagnosis (Do you have 2 or more medical diagnoseso) 0 No Ambulatory aid None/bed rest/wheelchair/nurse 0 Yes Crutches/cane/walker 0 No Furniture 0 No Intravenous therapy Access/Saline/Heparin Lock 0 No Gait/Transferring  Normal/ bed rest/ wheelchair 0 Yes Weak (short steps with or without shuffle, stooped but able to lift head while walking, may seek 0 No support from furniture) Impaired (short steps with shuffle, may have difficulty arising from chair, head down, impaired 0 No balance) Mental Status Oriented to own ability 0 Yes Electronic Signature(s) Signed: 10/24/2020 6:10:01 PM By: Baruch Gouty RN, BSN Entered By: Baruch Gouty on 10/24/2020 09:16:41 -------------------------------------------------------------------------------- Foot Assessment Details Patient Name: Date of Service: Ivan [name], Ivan BERT W. 10/24/2020 9:00 A M Medical Record Number: [id number] Patient Account Number: [id number] Date of Birth/Sex: Treating RN: February 22, 1946 (75 y.o. Ivan [name] Primary Care Giankarlo Leamer: [name] Other Clinician: Referring Gilma Bessette: Treating Cambry Spampinato/Extender: Ronnie Derby, [name] in Treatment: 0 Foot Assessment Items Site Locations + = Sensation present, - = Sensation absent, C = Callus, U = Ulcer R = Redness, W = Warmth, M = Maceration, PU = Pre-ulcerative lesion F = Fissure, S = Swelling, D = Dryness Assessment Right: Left: Other Deformity: No No Prior Foot Ulcer: No No Prior Amputation: No No Charcot Joint: No No Ambulatory Status: Ambulatory  Without Help Gait: Steady Electronic Signature(s) Signed: 10/24/2020 6:10:01 PM By: Baruch Gouty RN, BSN Entered By: Baruch Gouty on 10/24/2020 09:17:08 -------------------------------------------------------------------------------- Nutrition Risk Screening Details Patient Name: Date of Service: Ivan [name], Ivan BERT W. 10/24/2020 9:00 A M Medical Record Number: [id number] Patient Account Number: [id number] Date of Birth/Sex: Treating RN: [birth year] (75 y.o. Ivan [name] Primary Care Veretta Sabourin: [name] Other Clinician: Referring Ameena Vesey: Treating Jakeisha Stricker/Extender: Ronnie Derby, Ivan [name] in Treatment: 0 Height (in): 70 Weight (lbs): 187 Body Mass Index (BMI): 26.8 Nutrition Risk Screening Items Score Screening NUTRITION RISK SCREEN: I have an illness or condition that made me change the kind and/or amount of food I eat 0 No I eat fewer than two meals per day 0 No I eat few fruits and vegetables, or milk products 0 No I have three or more drinks of beer, liquor or wine almost every day 0 No I have tooth or mouth problems that make it hard for me to eat 0 No I don't always have enough money to buy the food I need 0 No I eat alone most of the time 0 No I take three or more different prescribed or over-the-counter drugs a day 1 Yes Without wanting to, I have lost or gained 10 pounds in the last six months 0 No I am not always physically able to shop, cook and/or feed myself 0 No Nutrition Protocols Good Risk Protocol 0 No interventions needed Moderate Risk Protocol High Risk Proctocol Risk Level: Good Risk Score: 1 Electronic Signature(s) Signed: 10/24/2020 6:10:01 PM By: Baruch Gouty RN, BSN Entered By: Baruch Gouty on 10/24/2020 09:16:59

## 2020-10-25 NOTE — Progress Notes (Signed)
[name], [name] ([id number]) Visit Report for 10/24/2020 Allergy List Details Patient Name: Date of Service: Ivan [name]. 10/24/2020 9:00 A M Medical Record Number: [id number] Patient Account Number: [id number] Date of Birth/Sex: Treating RN: 11/03/1945 (75 y.o. [name] Primary Care Roselind Klus: [name] Other Clinician: Referring Ladaja Yusupov: Treating Lavance Beazer/Extender: Ronnie Derby, Carina Weeks in Treatment: 0 Allergies Active Allergies Iodinated Contrast Media cefepime meropenem vancomycin Reaction: rash lisinopril Reaction: facial swelling Allergy Notes Electronic Signature(s) Signed: 10/24/2020 6:10:01 PM By: Baruch Gouty RN, BSN Entered By: Baruch Gouty on 10/24/2020 09:12:51 -------------------------------------------------------------------------------- Arrival Information Details Patient Name: Date of Service: Ivan [name], Ivan BERT W. 10/24/2020 9:00 A M Medical Record Number: [id number] Patient Account Number: [id number] Date of Birth/Sex: Treating RN: December 20, 1945 (75 y.o. [name] Primary Care Vernal Hritz: [name] Other Clinician: Referring Rusell Meneely: Treating Ashar Lewinski/Extender: Fonnie Jarvis in Treatment: 0 Visit Information Patient Arrived: Ambulatory Arrival Time: 09:07 Accompanied By: self Transfer Assistance: None Patient Identification Verified: Yes Secondary Verification Process Completed: Yes Patient Requires Transmission-Based Precautions: No Patient Has Alerts: No History Since Last Visit Added or deleted any medications: No Any new allergies or adverse reactions: No Had a fall or experienced change in activities of daily living that may affect risk of falls: No Signs or symptoms of abuse/neglect since last visito No Hospitalized since last visit: Yes Implantable device outside of the clinic excluding cellular tissue based products placed in the center since last visit: No Has Dressing  in Place as Prescribed: Yes Electronic Signature(s) Signed: 10/24/2020 6:10:01 PM By: Baruch Gouty RN, BSN Entered By: Baruch Gouty on 10/24/2020 09:10:25 -------------------------------------------------------------------------------- Clinic Level of Care Assessment Details Patient Name: Date of Service: Ivan [name], Ivan BERT W. 10/24/2020 9:00 A M Medical Record Number: [id number] Patient Account Number: [id number] Date of Birth/Sex: Treating RN: 1945-09-27 (75 y.o. [name] Primary Care Ellyce Lafevers: [name] Other Clinician: Referring Yen Wandell: Treating Jolee Critcher/Extender: Ronnie Derby, [name] in Treatment: 0 Clinic Level of Care Assessment Items TOOL 2 Quantity Score X- 1 0 Use when only an EandM is performed on the INITIAL visit ASSESSMENTS - Nursing Assessment / Reassessment X- 1 20 General Physical Exam (combine w/ comprehensive assessment (listed just below) when performed on new pt. evals) X- 1 25 Comprehensive Assessment (HX, ROS, Risk Assessments, Wounds Hx, etc.) ASSESSMENTS - Wound and Skin A ssessment / Reassessment X - Simple Wound Assessment / Reassessment - one wound 1 5 []  - 0 Complex Wound Assessment / Reassessment - multiple wounds X- 1 10 Dermatologic / Skin Assessment (not related to wound area) ASSESSMENTS - Ostomy and/or Continence Assessment and Care []  - 0 Incontinence Assessment and Management []  - 0 Ostomy Care Assessment and Management (repouching, etc.) PROCESS - Coordination of Care X - Simple Patient / Family Education for ongoing care 1 15 []  - 0 Complex (extensive) Patient / Family Education for ongoing care X- 1 10 Staff obtains Programmer, systems, Records, T Results / Process Orders est []  - 0 Staff telephones HHA, Nursing Homes / Clarify orders / etc []  - 0 Routine Transfer to another Facility (non-emergent condition) []  - 0 Routine Hospital Admission (non-emergent condition) []  - 0 New Admissions / Medical laboratory scientific officer / Ordering NPWT Apligraf, etc. , []  - 0 Emergency Hospital Admission (emergent condition) X- 1 10 Simple Discharge Coordination []  - 0 Complex (extensive) Discharge Coordination PROCESS - Special Needs []  - 0 Pediatric / Minor Patient Management []  - 0 Isolation Patient Management []  - 0 Hearing /  Language / Visual special needs []  - 0 Assessment of Community assistance (transportation, D/C planning, etc.) []  - 0 Additional assistance / Altered mentation []  - 0 Support Surface(s) Assessment (bed, cushion, seat, etc.) INTERVENTIONS - Wound Cleansing / Measurement X- 1 5 Wound Imaging (photographs - any number of wounds) []  - 0 Wound Tracing (instead of photographs) X- 1 5 Simple Wound Measurement - one wound []  - 0 Complex Wound Measurement - multiple wounds X- 1 5 Simple Wound Cleansing - one wound []  - 0 Complex Wound Cleansing - multiple wounds INTERVENTIONS - Wound Dressings X - Small Wound Dressing one or multiple wounds 1 10 []  - 0 Medium Wound Dressing one or multiple wounds []  - 0 Large Wound Dressing one or multiple wounds []  - 0 Application of Medications - injection INTERVENTIONS - Miscellaneous []  - 0 External ear exam []  - 0 Specimen Collection (cultures, biopsies, blood, body fluids, etc.) []  - 0 Specimen(s) / Culture(s) sent or taken to Lab for analysis []  - 0 Patient Transfer (multiple staff / Civil Service fast streamer / Similar devices) []  - 0 Simple Staple / Suture removal (25 or less) []  - 0 Complex Staple / Suture removal (26 or more) []  - 0 Hypo / Hyperglycemic Management (close monitor of Blood Glucose) []  - 0 Ankle / Brachial Index (ABI) - do not check if billed separately Has the patient been seen at the hospital within the last three years: Yes Total Score: 120 Level Of Care: New/Established - Level 4 Electronic Signature(s) Signed: 10/24/2020 6:05:55 PM By: [name] Entered By: [name] on 10/24/2020  12:52:23 -------------------------------------------------------------------------------- Encounter Discharge Information Details Patient Name: Date of Service: Ivan [name], Ivan BERT W. 10/24/2020 9:00 A M Medical Record Number: [id number] Patient Account Number: [id number] Date of Birth/Sex: Treating RN: [birth year] (75 y.o. [name] Primary Care Amberlee Garvey: [name] Other Clinician: Referring Falen Lehrmann: Treating Elfa Wooton/Extender: Fonnie Jarvis in Treatment: 0 Encounter Discharge Information Items Discharge Condition: Stable Ambulatory Status: Ambulatory Discharge Destination: Home Transportation: Private Auto Schedule Follow-up Appointment: Yes Clinical Summary of Care: Provided on 10/24/2020 Form Type Recipient Paper Patient Patient Electronic Signature(s) Signed: 10/24/2020 12:19:23 PM By: [name] Entered By: [name] on 10/24/2020 12:19:23 -------------------------------------------------------------------------------- Lower Extremity Assessment Details Patient Name: Date of Service: Ivan [name], Ivan BERT W. 10/24/2020 9:00 A M Medical Record Number: [id number] Patient Account Number: [id number] Date of Birth/Sex: Treating RN: 15-Dec-1945 (75 y.o. [name] Primary Care Patsye Sullivant: [name] Other Clinician: Referring Areen Trautner: Treating Yon Schiffman/Extender: Ronnie Derby, Carina Weeks in Treatment: 0 Edema Assessment Assessed: [Left: No] [Right: No] Edema: [Left: Yes] [Right: Yes] Calf Left: Right: Point of Measurement: From Medial Instep 38 cm Ankle Left: Right: Point of Measurement: From Medial Instep 29 cm Electronic Signature(s) Signed: 10/24/2020 6:10:01 PM By: Baruch Gouty RN, BSN Entered By: Baruch Gouty on 10/24/2020 09:18:14 -------------------------------------------------------------------------------- Multi Wound Chart Details Patient Name: Date of Service: Ivan [name], Ivan BERT W. 10/24/2020 9:00 A  M Medical Record Number: [id number] Patient Account Number: [id number] Date of Birth/Sex: Treating RN: 06-03-1945 (75 y.o. [name], Lauren Primary Care Seylah Wernert: [name] Other Clinician: Referring Latrece Nitta: Treating Aymee Fomby/Extender: Ronnie Derby, [name] in Treatment: 0 Vital Signs Height(in): 70 Pulse(bpm): 73 Weight(lbs): 187 Blood Pressure(mmHg): 137/71 Body Mass Index(BMI): 27 Temperature(F): 98.4 Respiratory Rate(breaths/min): 18 Photos: [3:No Photos Left Trochanter] [N/A:N/A N/A] Wound Location: [3:Pressure Injury] [N/A:N/A] Wounding Event: [3:Pressure Ulcer] [N/A:N/A] Primary Etiology: [3:Cataracts, Coronary Artery Disease,] [N/A:N/A] Comorbid History: [3:Hypertension, Myocardial Infarction, Osteoarthritis, Osteomyelitis 05/18/2019] [N/A:N/A]  Date Acquired: [3:0] [N/A:N/A] Weeks of Treatment: [3:Open] [N/A:N/A] Wound Status: [3:1.3x0.7x2.1] [N/A:N/A] Measurements L x W x D (cm) [3:0.715] [N/A:N/A] A (cm) : rea [3:1.501] [N/A:N/A] Volume (cm) : [3:8] Starting Position 1 (o'clock): [3:12] Ending Position 1 (o'clock): [3:2.1] Maximum Distance 1 (cm): [3:Yes] [N/A:N/A] Undermining: [3:Category/Stage IV] [N/A:N/A] Classification: [3:Medium] [N/A:N/A] Exudate A mount: [3:Well defined, not attached] [N/A:N/A] Wound Margin: [3:Large (67-100%)] [N/A:N/A] Granulation Amount: [3:Red] [N/A:N/A] Granulation Quality: [3:None Present (0%)] [N/A:N/A] Necrotic Amount: [3:Fat Layer (Subcutaneous Tissue): Yes N/A] Exposed Structures: [3:Fascia: No Tendon: No Muscle: No Joint: No Bone: No] Treatment Notes Electronic Signature(s) Signed: 10/24/2020 4:29:11 PM By: [name] MD Signed: 10/25/2020 6:22:58 PM By: Rhae Hammock RN Entered By: [name] on 10/24/2020 09:49:33 -------------------------------------------------------------------------------- Multi-Disciplinary Care Plan Details Patient Name: Date of Service: Ivan [name], Ivan BERT W.  10/24/2020 9:00 A M Medical Record Number: [id number] Patient Account Number: [id number] Date of Birth/Sex: Treating RN: 1946-03-25 (75 y.o. [name], Lauren Primary Care Labib Cwynar: [name] Other Clinician: Referring Aleyssa Pike: Treating Caysen Whang/Extender: Ronnie Derby, [name] in Treatment: 0 Active Inactive Orientation to the Wound Care Program Nursing Diagnoses: Knowledge deficit related to the wound healing center program Goals: Patient/caregiver will verbalize understanding of the [facility] Date Initiated: 10/24/2020 Target Resolution Date: 11/16/2020 Goal Status: Active Interventions: Provide education on orientation to the wound center Notes: Wound/Skin Impairment Nursing Diagnoses: Impaired tissue integrity Knowledge deficit related to ulceration/compromised skin integrity Goals: Patient will have a decrease in wound volume by X% from date: (specify in notes) Date Initiated: 10/24/2020 Target Resolution Date: 11/16/2020 Goal Status: Active Patient/caregiver will verbalize understanding of skin care regimen Date Initiated: 10/24/2020 Target Resolution Date: 11/16/2020 Goal Status: Active Ulcer/skin breakdown will have a volume reduction of 30% by week 4 Date Initiated: 10/24/2020 Target Resolution Date: 11/16/2020 Goal Status: Active Interventions: Assess patient/caregiver ability to obtain necessary supplies Assess patient/caregiver ability to perform ulcer/skin care regimen upon admission and as needed Assess ulceration(s) every visit Notes: Electronic Signature(s) Signed: 10/25/2020 6:22:58 PM By: Rhae Hammock RN Entered By: Rhae Hammock on 10/24/2020 09:18:00 -------------------------------------------------------------------------------- Pain Assessment Details Patient Name: Date of Service: Ivan [name], Ivan BERT W. 10/24/2020 9:00 A M Medical Record Number: [id number] Patient Account Number: [id number] Date of  Birth/Sex: Treating RN: 16-Jan-1946 (75 y.o. [name] Primary Care Latifah Padin: [name] Other Clinician: Referring Jyaire Koudelka: Treating Leilanee Righetti/Extender: Ronnie Derby, [name] in Treatment: 0 Active Problems Location of Pain Severity and Description of Pain Patient Has Paino No Site Locations Rate the pain. Current Pain Level: 0 Pain Management and Medication Current Pain Management: Electronic Signature(s) Signed: 10/24/2020 6:10:01 PM By: Baruch Gouty RN, BSN Entered By: Baruch Gouty on 10/24/2020 09:25:56 -------------------------------------------------------------------------------- Patient/Caregiver Education Details Patient Name: Date of Service: [place] M, Ivan BERT W. 6/14/2022andnbsp9:00 [place] Record Number: [id number] Patient Account Number: [id number] Date of Birth/Gender: Treating RN: Sep 28, 1945 (75 y.o. [name] Primary Care Physician: [name] Other Clinician: Referring Physician: Treating Physician/Extender: Fonnie Jarvis in Treatment: 0 Education Assessment Education Provided To: Patient Education Topics Provided Welcome T [facility]: o Methods: Explain/Verbal Responses: State content correctly Electronic Signature(s) Signed: 10/25/2020 6:22:58 PM By: Rhae Hammock RN Entered By: Rhae Hammock on 10/24/2020 09:18:09 -------------------------------------------------------------------------------- Wound Assessment Details Patient Name: Date of Service: Ivan [name], Ivan BERT W. 10/24/2020 9:00 A M Medical Record Number: [id number] Patient Account Number: [id number] Date of Birth/Sex: Treating RN: 02-14-1946 (75 y.o. [name] Primary Care Trystyn Sitts: [name] Other Clinician: Referring  Tredarius Cobern: Treating Navjot Loera/Extender: Ronnie Derby, Carina Weeks in Treatment: 0 Wound Status Wound Number: 3 Primary Pressure Ulcer Etiology: Wound  Location: Left Trochanter Wound Open Wounding Event: Pressure Injury Status: Date Acquired: 05/18/2019 Comorbid Cataracts, Coronary Artery Disease, Hypertension, Myocardial Weeks Of Treatment: 0 History: Infarction, Osteoarthritis, Osteomyelitis Clustered Wound: No Photos Wound Measurements Length: (cm) 1.3 Width: (cm) 0.7 Depth: (cm) 2.1 Area: (cm) 0.715 Volume: (cm) 1.501 % Reduction in Area: 0% % Reduction in Volume: 0% Tunneling: No Undermining: Yes Starting Position (o'clock): 8 Ending Position (o'clock): 12 Maximum Distance: (cm) 2.1 Wound Description Classification: Category/Stage IV Wound Margin: Well defined, not attached Exudate Amount: Medium Foul Odor After Cleansing: No Slough/Fibrino No Wound Bed Granulation Amount: Large (67-100%) Exposed Structure Granulation Quality: Red Fascia Exposed: No Necrotic Amount: None Present (0%) Fat Layer (Subcutaneous Tissue) Exposed: Yes Tendon Exposed: No Muscle Exposed: No Joint Exposed: No Bone Exposed: No Treatment Notes Wound #3 (Trochanter) Wound Laterality: Left Cleanser Wound Cleanser Discharge Instruction: Cleanse the wound with wound cleanser prior to applying a clean dressing using gauze sponges, not tissue or cotton balls. Soap and Water Discharge Instruction: May shower and wash wound with dial antibacterial soap and water prior to dressing change. Peri-Wound Care Topical Primary Dressing hydrogel wet-to-dry Discharge Instruction: daily Secondary Dressing Woven Gauze Sponge, Non-Sterile 4x4 in Discharge Instruction: Apply over primary dressing as directed. ABD Pad, 5x9 Discharge Instruction: Apply over primary dressing as directed. Secured With 50M Medipore H Soft Cloth Surgical T 4 x 2 (in/yd) ape Discharge Instruction: Secure dressing with tape as directed. Compression Wrap Compression Stockings Add-Ons Electronic Signature(s) Signed: 10/24/2020 4:42:14 PM By: Sandre Kitty Signed:  10/24/2020 6:10:01 PM By: Baruch Gouty RN, BSN Entered By: Sandre Kitty on 10/24/2020 16:12:11 -------------------------------------------------------------------------------- Vitals Details Patient Name: Date of Service: Ivan [name], Ivan BERT W. 10/24/2020 9:00 A M Medical Record Number: [id number] Patient Account Number: [id number] Date of Birth/Sex: Treating RN: 12/04/1945 (75 y.o. [name] Primary Care Lattie Riege: [name] Other Clinician: Referring Tricia Pledger: Treating Biannca Scantlin/Extender: Ronnie Derby, [name] in Treatment: 0 Vital Signs Time Taken: 09:10 Temperature (F): 98.4 Height (in): 70 Pulse (bpm): 73 Source: Stated Respiratory Rate (breaths/min): 18 Weight (lbs): 187 Blood Pressure (mmHg): 137/71 Source: Stated Reference Range: 80 - 120 mg / dl Body Mass Index (BMI): 26.8 Electronic Signature(s) Signed: 10/24/2020 6:10:01 PM By: Baruch Gouty RN, BSN Entered By: Baruch Gouty on 10/24/2020 09:12:32

## 2020-10-25 NOTE — Progress Notes (Signed)
Ivan, [name] ([id number]) Visit Report for 10/24/2020 Chief Complaint Document Details Patient Name: Date of Service: Ivan [name]. 10/24/2020 9:00 A M Medical Record Number: [id number] Patient Account Number: [id number] Date of Birth/Sex: Treating RN: [birth year] (75 y.o. [name], Lauren Primary Care Provider: [name] Other Clinician: Referring Provider: Treating Provider/Extender: Ronnie Derby, [name] in Treatment: 0 Information Obtained from: Patient Chief Complaint 07/29/2019; patient is here for review of a fairly substantial wound over the left greater trochanter 07/04/2020; patient returns to clinic for follow-up of the original ulcer over the left greater trochanter 10/24/2020; patient returns to clinic for follow-up of his original ulcer over the left greater trochanter Electronic Signature(s) Signed: 10/24/2020 4:29:11 PM By: [name] MD Entered By: [name] on 10/24/2020 09:50:05 -------------------------------------------------------------------------------- HPI Details Patient Name: Date of Service: Ivan [name], [name] 10/24/2020 9:00 A M Medical Record Number: [id number] Patient Account Number: [id number] Date of Birth/Sex: Treating RN: 08-14-1945 (75 y.o. 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 He required admission to hospital from 1/5 through 1/9 with Covid pneumonia. At that point he apparently left AMA. He was noted to have a left hip bruise at the time of discharge. I suspect this really was a deep tissue injury. He was readmitted from 1/13 through 1//27 with altered mental status Covid pneumonia and delirium. At that point he had a left hip  and thigh wound which had worsened. Felt to be a pressure injury. He was seen by general surgery who recommended normal saline wet-to-dry twice daily he did not have a debridement. After he left at that point he was incapacitated by increasing lumbar pain. An MRI on 2/9 showed discitis and osteomyelitis at L1/2. He asked aspiration of 1 cm fluid however I think this culture negative. He was also noted at this point to have a very large wound over the left greater trochanter. He was taken to the OR by Dr. Marla Roe for debridement with ACell placement. He was placed in a wound VAC. A culture at that point showed Enterococcus and Enterobacter. An MRI on 3/15 showed no evidence of left hip septic arthritis or osteomyelitis. Extensive subcutaneous edema within the visualized soft tissue of the left thigh and prominent left inguinal lymph nodes likely reactive. He was started on IV cefepime and vancomycin for 6 weeks. He was discharged to [facility] facility as the patient. There I gather the wound developed the necrotic surface. He was seen by the wound care doctor in house who correctly pointed out that a wound VAC would not do anything with a necrotic surface. Since then he has been using Santyl The patient is now at home. He was on vancomycin and cefepime. He developed hives and a generalized rash and was seen in the ER on 3/15 by Dr. Cain Sieve recommendation he was changed to meropenem however he also apparently developed hives after this. He says the original hives stopped after the cefepime was stopped and follow-up hives only developed after the meropenem. He is only taken 1 dose of the fear of the meropenem and now is on vancomycin. 3/25; the patient was seen by Dr. Linus Salmons on 3/22. Because of intolerances to meropenem which I communicated with Dr. De Burrs about he is now on daptomycin and Cipro  until sometime in early April. He seems to be tolerating the antibiotics well. We are using Santyl back by  wet-to-dry dressings. And making some improvement in the general surface of this very large wound 4/8; we are using Santyl back by wet-to-dry. We are making progress in terms of prepping the wound for a wound VAC. Still necrotic material requiring debridement. 4/16; we are using Santyl back by wet-to-dry. Not convinced that his wife is packing this properly there is a 6 cm tunnel at 12:00. We brought this to attention. Still requiring mechanical debridement 4/29; 2-week follow-up. In general the wound is better however there is considerable tunneling tunneling to the greater trochanter itself. There is still some debris in the deeper parts of this wound I did not do mechanical debridement on this today. I am going to change the dressing to quarter strength Dakin's wet-to-dry. We will put a wound VAC through his insurance. His wife has had brain damage from a previous meningioma surgery. She is not comfortable around strangers in her home. The patient is concerned about this 5/18; patient is using quarter strength Dakin's wet to dry. The problem is a deep pressure area over the left greater trochanter. There was some question from her intake nurses that this is actually come in and some question raised about whether he would require a wound VAC or not. The issue with the wound VAC was complicated predominantly by the patient's reluctance to allow people into his home i.e. home health although he tells me he has this mostly "sorted out" 7/1; this is a patient that I have not seen in almost 6 weeks. Apparently canceled several appointments related to family problems deaths in the family etc. He is wife has ophthalmologic surgery next week. I had been trying to get a wound VAC ordered for this deep probing wound over the left greater trochanter but so far I think he is simply doing a Dakin's wet-to-dry and our intake nurse did not think there was any Dakin's on it. I have a note from Dr. Tommy Medal of  infectious disease on 6/30. He has now been off doxycycline and ciprofloxacin for several weeks. He has no undue pain or drainage. He also had discitis and osteomyelitis at L1-L2 with a 1 cm left psoas abscess as well as some increased signal at T10/T11. He is not having any problems related to this. 12/16/2019 on evaluation today patient's wound actually is showing some signs of bit of improvement which is good news. Fortunately there is no signs of active infection at this time which is good news. He tells me that the nurses have been coming to change this 3 times a week and doing a great job. He told the intake nurse that they had only been coming out once a week. I am not really sure what is going on with the discrepancy and stories here. Nonetheless I do believe that he needs to have this changed 3 times a week on a regular basis he does seem to have a little bit of a fungal infection around the wound. 8/19; 2-week follow-up. I have not seen this patient in over 6 weeks. There is been a dramatic improvement in the overall wound volume. Circumference of the wound is now small although there is still considerable undermining from roughly 9-2 o'clock maximum 1:00 at roughly 4.7 cm. There is no purulent drainage. He has some irritation around the wound on the normal skin. They are using miconazole on this. 9/2; 2-week  follow-up. Dimensions of the wound circumference look quite good. There is tunneling at 4.3 cm at 1:00 versus 4.7 last time. The area continues to look like it is contracting with the wound VAC and I think the treatment of choice here will be continuing to the wound VAC as long as it seems practical. There is no evidence of infection. As far as I am aware he is offloading this aggressively 9/16; 2-week follow-up. Left greater trochanter. Dimensions about the same still about 4.9 cm of maximal undermining. Nevertheless the visible tissue that I can see looks healthy. We have a wound VAC and  really from the perspective of how this looks initially this is gotten a lot better however not much change in the last week 9/30 2-week follow-up. Left greater trochanter. He has a tunnel at 12:00 that goes about 7 cm. From 10-12 lesser degrees of undermining. HOWEVER he comes in today telling us that he is going to have to go back to work. He will not be able to work with a wound VAC and he wants that discontinued. There was not much discussion. This is a financial issue for him. He also has erythema around the wound orifice which has satellite lesions around it this is no doubt cutaneous candidiasis 10/12; 2-week follow-up. Left greater trochanter. 7.2 cm of tunneling. 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 He has not been systemically unwell. 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 He is using silver alginate which he is dressing himself, ABDs with a large plastic occlusive dressing. Dimensions are somewhat better although this is an irregular wound. When he was here the last time he had a lot of drainage coming out of this wound. The culture I did showed methicillin sensitive staph aureus. I gave him 10 days of doxycycline and he is just finishing up on this. He states the drainage is a lot better 04/03/2020 upon evaluation today patient appears to be doing well with regard to his wound as far as the overall appearance is concerned the one issue that he is noting is that he does have some green drainage occurring at times. Obviously this brings up the concern for Pseudomonas I will see anything visually right now that has me overly concerned about at the same time that history does concern me. The patient would like to see if we can attempt to treat this today. I think that being that green drainage is pretty much pathognomonic for a Pseudomonas  infection I think he can continue to doxycycline but probably should do a couple weeks of Cipro to try to see if that clears up the drainage if not then we may need to delve into a more extensive culture possibly PCR at that point. With regard to the wound itself also believe based on talking with the patient today that he could be a candidate for a flap surgery that something he would like to consider READMISSION 07/04/2020 Mr. Carchi is a 75 year old man we had in clinic until the end of November 2021. He had a substantial wound on the left greater trochanter that required an original IandD by Dr. Marla Roe early in 2021. This was an infected wound and had IV antibiotics directed by infectious disease. He also had an L1-L2 discitis. We may have him in clinic here we initially put  him in a wound VAC and he actually did quite well with this with filling in of the underlying tissue however he had to go back to work. 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 He is still working there 40 hours a week second shift. He was coming to this clinic until late in November I am not exactly sure what happened. I had not referred him to plastic surgery because he would have to inevitably take time off work that he was not prepared to do. The orifice of the wound was too small to really reconsider a wound VAC and that still true. He arrives back in clinic with a direct depth of the wound at 1.5 cm that tunnel goes to 3.1 cm from 3.6 last time. He saw Dr. Johnnye Sima on 04/14/2020. He ordered a follow-up MRI. This showed persistence of a peritrochanteric fluid collection in the bursa which was slightly larger than the previous image. This communicated with the wounds. The patient still says he is having clear fluid drainage and he is changing his dressing twice a day. He is currently using predominantly half-strength Dakin's wet-to-dry and he correctly notes that the wound is clean and has come  in significantly albeit slowly. He has not noticed any systemic symptoms. No real pain. Last measurements of his C-reactive protein and sedimentation rate were 3.7 and 2 respectively. He was supposed to follow-up with Dr. Johnnye Sima I am not sure that he has. Dr. Johnnye Sima had tried to organize an appointment with Dr. Marla Roe I do not think that ever got done either. The patient tells me he is not willing to consider any further surgery to this area until he has short-term disability time which he thinks will happen sometime in May or June 3/22; monthly follow-up. Minimal change in the important measurements of depth at 1.7 and the 12:00 tunnel at 2.6. We have been referred back to Dr. Marla Roe I think by Dr. Johnnye Sima although he does not yet have a plastics appointment 10/24/2020; patient dropped off our schedule when he returns to clinic with for review of the same wound over the left greater trochanter. He says the reason is because of medical issues including TUR of a benign bladder tumor. He also had cataract issues. In any case he has been using a hydrogel wet-to-dry to his left hip wound. He is working full-time this does not limit him. Direct depth today measured at 2.1 with undermining from 8-12 also with 2.1. This represents some improvement. He tells me he never did see Dr. Marla Roe again because he could not coordinate another appointment. He is not really wanting to do the flap surgery type of thing he says today Electronic Signature(s) Signed: 10/24/2020 4:29:11 PM By: [name] MD Entered By: [name] on 10/24/2020 09:51:47 -------------------------------------------------------------------------------- Physical Exam Details Patient Name: Date of Service: Ivan [name], [name] 10/24/2020 9:00 A M Medical Record Number: [id number] Patient Account Number: [id number] Date of Birth/Sex: Treating RN: June 20, 1945 (75 y.o. [name] Primary Care Provider: [name] Other Clinician: Referring Provider: Treating Provider/Extender: Ronnie Derby, [name] in Treatment: 0 Constitutional Sitting or standing Blood Pressure is within target range for patient.. Pulse regular and within target range for patient.Marland Kitchen Respirations regular, non-labored and within target range.. Temperature is normal and within the target range for the patient.Marland Kitchen Appears in no distress. Respiratory work of breathing is normal. Integumentary (Hair, Skin) Skin around the wound is somewhat erythematous but  nontender I think this simply represents scar tissue. Notes Wound exam; right greater trochanter. What I can see here looks healthy. There is 2.1 cm of direct depth. No palpable bone. Undermining from 8-12 at 2.1 cm as well. There is no purulent drainage Electronic Signature(s) Signed: 10/24/2020 4:29:11 PM By: [name] MD Entered By: [name] on 10/24/2020 09:52:52 -------------------------------------------------------------------------------- Physician Orders Details Patient Name: Date of Service: Ivan [name], [name] 10/24/2020 9:00 A M Medical Record Number: [id number] Patient Account Number: [id number] Date of Birth/Sex: Treating RN: 05-Jun-1945 (75 y.o. [name] Primary Care Provider: [name] Other Clinician: Referring Provider: Treating Provider/Extender: Fonnie Jarvis in Treatment: 0 Verbal / Phone Orders: No Diagnosis Coding Follow-up Appointments Return appointment in 1 month. Bathing/ Shower/ Hygiene May shower with protection but do not get wound dressing(s) wet. Off-Loading Turn and reposition every 2 hours Wound Treatment Wound #3 - Trochanter Wound Laterality: Left Cleanser: Soap and Water 1 x Per Day/30 Days Discharge Instructions: May shower and wash wound with dial antibacterial soap and water prior to dressing change. Cleanser: Wound Cleanser (DME) (Generic) 1 x Per Day/30  Days Discharge Instructions: Cleanse the wound with wound cleanser prior to applying a clean dressing using gauze sponges, not tissue or cotton balls. Prim Dressing: hydrogel wet-to-dry 1 x Per Day/30 Days ary Discharge Instructions: daily Secondary Dressing: Woven Gauze Sponge, Non-Sterile 4x4 in (DME) (Generic) 1 x Per Day/30 Days Discharge Instructions: Apply over primary dressing as directed. Secondary Dressing: ABD Pad, 5x9 (DME) (Generic) 1 x Per Day/30 Days Discharge Instructions: Apply over primary dressing as directed. Secured With: 38M Medipore H Soft Cloth Surgical T 4 x 2 (in/yd) (DME) (Generic) 1 x Per Day/30 Days ape Discharge Instructions: Secure dressing with tape as directed. Electronic Signature(s) Signed: 10/24/2020 4:29:11 PM By: [name] MD Signed: 10/25/2020 6:22:58 PM By: Rhae Hammock RN Entered By: Rhae Hammock on 10/24/2020 09:47:41 -------------------------------------------------------------------------------- Problem List Details Patient Name: Date of Service: Ivan [name], [name] 10/24/2020 9:00 A M Medical Record Number: [id number] Patient Account Number: [id number] Date of Birth/Sex: Treating RN: [birth year] (75 y.o. [name], Lauren Primary Care Provider: [name] Other Clinician: Referring Provider: Treating Provider/Extender: Ronnie Derby, [name] in Treatment: 0 Active Problems ICD-10 Encounter Code Description Active Date MDM Diagnosis L89.224 Pressure ulcer of left hip, stage 4 10/24/2020 No Yes Inactive Problems Resolved Problems Electronic Signature(s) Signed: 10/24/2020 4:29:11 PM By: [name] MD Entered By: [name] on 10/24/2020 09:48:57 -------------------------------------------------------------------------------- Progress Note Details Patient Name: Date of Service: Ivan [name], [name] 10/24/2020 9:00 A M Medical Record Number: [id number] Patient Account Number: [id number] Date of  Birth/Sex: Treating RN: 1945/07/12 (75 y.o. 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 He required admission to hospital from 1/5 through 1/9 with Covid pneumonia. At that point he apparently left AMA. He was noted to have a left hip bruise at the time of discharge. I suspect this really was  a deep tissue injury. He was readmitted from 1/13 through 1//27 with altered mental status Covid pneumonia and delirium. At that point he had a left hip and thigh wound which had worsened. Felt to be a pressure injury. He was seen by general surgery who recommended normal saline wet-to-dry twice daily he did not have a debridement. After he left at that point he was incapacitated by increasing lumbar pain. An MRI on 2/9 showed discitis and osteomyelitis at L1/2. He asked aspiration of 1 cm fluid however I think this culture negative. He was also noted at this point to have a very large wound over the left greater trochanter. He was taken to the OR by Dr. Marla Roe for debridement with ACell placement. He was placed in a wound VAC. A culture at that point showed Enterococcus and Enterobacter. An MRI on 3/15 showed no evidence of left hip septic arthritis or osteomyelitis. Extensive subcutaneous edema within the visualized soft tissue of the left thigh and prominent left  inguinal lymph nodes likely reactive. He was started on IV cefepime and vancomycin for 6 weeks. He was discharged to [facility] as the patient. There I gather the wound developed the necrotic surface. He was seen by the wound care doctor in house who correctly pointed out that a wound VAC would not do anything with a necrotic surface. Since then he has been using Santyl The patient is now at home. He was on vancomycin and cefepime. He developed hives and a generalized rash and was seen in the ER on 3/15 by Dr. Cain Sieve recommendation he was changed to meropenem however he also apparently developed hives after this. He says the original hives stopped after the cefepime was stopped and follow-up hives only developed after the meropenem. He is only taken 1 dose of the fear of the meropenem and now is on vancomycin. 3/25; the patient was seen by Dr. Linus Salmons on 3/22. Because of intolerances to meropenem which I communicated with Dr. De Burrs about he is now on daptomycin and Cipro until sometime in early April. He seems to be tolerating the antibiotics well. We are using Santyl back by wet-to-dry dressings. And making some improvement in the general surface of this very large wound 4/8; we are using Santyl back by wet-to-dry. We are making progress in terms of prepping the wound for a wound VAC. Still necrotic material requiring debridement. 4/16; we are using Santyl back by wet-to-dry. Not convinced that his wife is packing this properly there is a 6 cm tunnel at 12:00. We brought this to attention. Still requiring mechanical debridement 4/29; 2-week follow-up. In general the wound is better however there is considerable tunneling tunneling to the greater trochanter itself. There is still some debris in the deeper parts of this wound I did not do mechanical debridement on this today. I am going to change the dressing to quarter strength Dakin's wet-to-dry. We will put a wound VAC through his  insurance. His wife has had brain damage from a previous meningioma surgery. She is not comfortable around strangers in her home. The patient is concerned about this 5/18; patient is using quarter strength Dakin's wet to dry. The problem is a deep pressure area over the left greater trochanter. There was some question from her intake nurses that this is actually come in and some question raised about whether he would require a wound VAC or not. The issue with the wound VAC was complicated predominantly by the patient's reluctance to allow people into  his home i.e. home health although he tells me he has this mostly "sorted out" 7/1; this is a patient that I have not seen in almost 6 weeks. Apparently canceled several appointments related to family problems deaths in the family etc. He is wife has ophthalmologic surgery next week. I had been trying to get a wound VAC ordered for this deep probing wound over the left greater trochanter but so far I think he is simply doing a Dakin's wet-to-dry and our intake nurse did not think there was any Dakin's on it. I have a note from Dr. Tommy Medal of infectious disease on 6/30. He has now been off doxycycline and ciprofloxacin for several weeks. He has no undue pain or drainage. He also had discitis and osteomyelitis at L1-L2 with a 1 cm left psoas abscess as well as some increased signal at T10/T11. He is not having any problems related to this. 12/16/2019 on evaluation today patient's wound actually is showing some signs of bit of improvement which is good news. Fortunately there is no signs of active infection at this time which is good news. He tells me that the nurses have been coming to change this 3 times a week and doing a great job. He told the intake nurse that they had only been coming out once a week. I am not really sure what is going on with the discrepancy and stories here. Nonetheless I do believe that he needs to have this changed 3 times a week on a  regular basis he does seem to have a little bit of a fungal infection around the wound. 8/19; 2-week follow-up. I have not seen this patient in over 6 weeks. There is been a dramatic improvement in the overall wound volume. Circumference of the wound is now small although there is still considerable undermining from roughly 9-2 o'clock maximum 1:00 at roughly 4.7 cm. There is no purulent drainage. He has some irritation around the wound on the normal skin. They are using miconazole on this. 9/2; 2-week follow-up. Dimensions of the wound circumference look quite good. There is tunneling at 4.3 cm at 1:00 versus 4.7 last time. The area continues to look like it is contracting with the wound VAC and I think the treatment of choice here will be continuing to the wound VAC as long as it seems practical. There is no evidence of infection. As far as I am aware he is offloading this aggressively 9/16; 2-week follow-up. Left greater trochanter. Dimensions about the same still about 4.9 cm of maximal undermining. Nevertheless the visible tissue that I can see looks healthy. We have a wound VAC and really from the perspective of how this looks initially this is gotten a lot better however not much change in the last week 9/30 2-week follow-up. Left greater trochanter. He has a tunnel at 12:00 that goes about 7 cm. From 10-12 lesser degrees of undermining. HOWEVER he comes in today telling us that he is going to have to go back to work. He will not be able to work with a wound VAC and he wants that discontinued. There was not much discussion. This is a financial issue for him. He also has erythema around the wound orifice which has satellite lesions around it this is no doubt cutaneous candidiasis 10/12; 2-week follow-up. Left greater trochanter. 7.2 cm of tunneling. 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 He has not been systemically unwell. 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 He is using silver alginate which he is dressing himself, ABDs with a large plastic occlusive dressing. Dimensions are somewhat better although this is an irregular wound. When he was here the last time he had a lot of drainage coming out of this wound. The culture I did showed methicillin sensitive staph aureus. I gave him 10 days of doxycycline and he is just finishing up on this. He states the drainage is a lot better 04/03/2020 upon evaluation today patient appears to be doing well with regard to his wound as far as the overall appearance is concerned the one issue that he is noting is that he does have some green drainage occurring at times. Obviously this brings up the concern for Pseudomonas I will see anything visually right now that has me overly concerned about at the same time that history does concern me. The patient would like to see if we can attempt to treat this today. I think that being that green drainage is pretty much pathognomonic for a Pseudomonas infection I think he can continue to doxycycline but probably should do a couple weeks of Cipro to try to see if that clears up the drainage if not then we may need to delve into a more extensive culture possibly PCR at that point. With regard to the wound itself also believe based on talking with the patient today that he could be a candidate for a flap surgery that something he would like to consider READMISSION 07/04/2020 Mr. Gonnella is a 75 year old man we had in clinic until the end of November 2021. He had a substantial wound on the left greater trochanter that required an original IandD by Dr. Marla Roe early in 2021. This was an infected wound and had IV antibiotics directed by infectious disease. He also had an L1-L2 discitis. We may have him in clinic here we initially put him in a wound VAC and he actually  did quite well with this with filling in of the underlying tissue however he had to go back to work. 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 He is still working there 40 hours a week second shift. He was coming to this clinic until late in November I am not exactly sure what happened. I had not referred him to plastic surgery because he would have to inevitably take time off work that he was not prepared to do. The orifice of the wound was too small to really reconsider a wound VAC and that still true. He arrives back in clinic with a direct depth of the wound at 1.5 cm that tunnel goes to 3.1 cm from 3.6 last time. He saw Dr. Johnnye Sima on 04/14/2020. He ordered a follow-up MRI. This showed persistence of a peritrochanteric fluid collection in the bursa which was slightly larger than the previous image. This communicated with the wounds. The patient still says he is having clear fluid drainage and he is changing his dressing twice a day. He is currently using predominantly half-strength Dakin's wet-to-dry and he correctly notes that the wound is clean and has come in significantly albeit slowly. He has not noticed any systemic symptoms. No real pain. Last measurements of his C-reactive protein and sedimentation rate were 3.7 and 2 respectively. He was supposed to follow-up with Dr. Johnnye Sima I am not  sure that he has. Dr. Johnnye Sima had tried to organize an appointment with Dr. Marla Roe I do not think that ever got done either. The patient tells me he is not willing to consider any further surgery to this area until he has short-term disability time which he thinks will happen sometime in May or June 3/22; monthly follow-up. Minimal change in the important measurements of depth at 1.7 and the 12:00 tunnel at 2.6. We have been referred back to Dr. Marla Roe I think by Dr. Johnnye Sima although he does not yet have a plastics appointment 10/24/2020; patient dropped off our schedule when  he returns to clinic with for review of the same wound over the left greater trochanter. He says the reason is because of medical issues including TUR of a benign bladder tumor. He also had cataract issues. In any case he has been using a hydrogel wet-to-dry to his left hip wound. He is working full-time this does not limit him. Direct depth today measured at 2.1 with undermining from 8-12 also with 2.1. This represents some improvement. He tells me he never did see Dr. Marla Roe again because he could not coordinate another appointment. He is not really wanting to do the flap surgery type of thing he says today Patient History Information obtained from Patient. Allergies Iodinated Contrast Media, cefepime, meropenem, vancomycin (Reaction: rash), lisinopril (Reaction: facial swelling) Family History Cancer - Paternal Grandparents, Diabetes - Paternal Grandparents, Kidney Disease - Maternal Grandparents, No family history of Heart Disease, Hereditary Spherocytosis, Hypertension, Lung Disease, Seizures, Stroke, Thyroid Problems, Tuberculosis. Social History Former smoker - quit 5 years ago, Marital Status - Married, Alcohol Use - Rarely, Drug Use - No History, Caffeine Use - Daily. Medical History Eyes Patient has history of Cataracts Denies history of Glaucoma, Optic Neuritis Ear/Nose/Mouth/Throat Denies history of Chronic sinus problems/congestion, Middle ear problems Hematologic/Lymphatic Denies history of Anemia, Hemophilia, Human Immunodeficiency Virus, Lymphedema, Sickle Cell Disease Respiratory Denies history of Aspiration, Asthma, Chronic Obstructive Pulmonary Disease (COPD), Pneumothorax, Sleep Apnea, Tuberculosis Cardiovascular Patient has history of Coronary Artery Disease, Hypertension, Myocardial Infarction Gastrointestinal Denies history of Cirrhosis , Colitis, Crohnoos, Hepatitis A, Hepatitis B, Hepatitis C Endocrine Denies history of Type I Diabetes, Type II  Diabetes Genitourinary Denies history of End Stage Renal Disease Immunological Denies history of Lupus Erythematosus, Raynaudoos, Scleroderma Integumentary (Skin) Denies history of History of Burn Musculoskeletal Patient has history of Osteoarthritis, Osteomyelitis Denies history of Gout, Rheumatoid Arthritis Neurologic Denies history of Dementia, Neuropathy, Quadriplegia, Paraplegia, Seizure Disorder Oncologic Denies history of Received Chemotherapy, Received Radiation Psychiatric Denies history of Anorexia/bulimia, Confinement Anxiety Hospitalization/Surgery History - appendectomy. - cardiac catheterization. - cataract extraction. - cervical fusion. - CABG. - TURP. - cataract laser procedure. Medical A Surgical History Notes nd Endocrine testosterone deficiency Genitourinary BPH Oncologic skin cancer Review of Systems (ROS) Constitutional Symptoms (General Health) Denies complaints or symptoms of Fatigue, Fever, Chills, Marked Weight Change. Eyes Complains or has symptoms of Glasses / Contacts - reading. Denies complaints or symptoms of Dry Eyes, Vision Changes. Ear/Nose/Mouth/Throat Denies complaints or symptoms of Chronic sinus problems or rhinitis. Respiratory Denies complaints or symptoms of Chronic or frequent coughs, Shortness of Breath. Cardiovascular Denies complaints or symptoms of Chest pain. Gastrointestinal Denies complaints or symptoms of Frequent diarrhea, Nausea, Vomiting. Endocrine Denies complaints or symptoms of Heat/cold intolerance. Genitourinary Denies complaints or symptoms of Frequent urination. Integumentary (Skin) Complains or has symptoms of Wounds - left hip. Musculoskeletal Denies complaints or symptoms of Muscle Pain, Muscle Weakness. Neurologic Denies complaints or symptoms  of Numbness/parasthesias. Psychiatric Denies complaints or symptoms of Claustrophobia, Suicidal. Objective Constitutional Sitting or standing Blood Pressure is  within target range for patient.. Pulse regular and within target range for patient.Marland Kitchen Respirations regular, non-labored and within target range.. Temperature is normal and within the target range for the patient.Marland Kitchen Appears in no distress. Vitals Time Taken: 9:10 AM, Height: 70 in, Source: Stated, Weight: 187 lbs, Source: Stated, BMI: 26.8, Temperature: 98.4 F, Pulse: 73 bpm, Respiratory Rate: 18 breaths/min, Blood Pressure: 137/71 mmHg. Respiratory work of breathing is normal. General Notes: Wound exam; right greater trochanter. What I can see here looks healthy. There is 2.1 cm of direct depth. No palpable bone. Undermining from 8-12 at 2.1 cm as well. There is no purulent drainage Integumentary (Hair, Skin) Skin around the wound is somewhat erythematous but nontender I think this simply represents scar tissue. Wound #3 status is Open. Original cause of wound was Pressure Injury. The date acquired was: 05/18/2019. The wound is located on the Left Trochanter. The wound measures 1.3cm length x 0.7cm width x 2.1cm depth; 0.715cm^2 area and 1.501cm^3 volume. There is Fat Layer (Subcutaneous Tissue) exposed. There is no tunneling noted, however, there is undermining starting at 8:00 and ending at 12:00 with a maximum distance of 2.1cm. There is a medium amount of drainage noted. The wound margin is well defined and not attached to the wound base. There is large (67-100%) red granulation within the wound bed. There is no necrotic tissue within the wound bed. Assessment Active Problems ICD-10 Pressure ulcer of left hip, stage 4 Plan Follow-up Appointments: Return appointment in 1 month. Bathing/ Shower/ Hygiene: May shower with protection but do not get wound dressing(s) wet. Off-Loading: Turn and reposition every 2 hours WOUND #3: - Trochanter Wound Laterality: Left Cleanser: Soap and Water 1 x Per Day/30 Days Discharge Instructions: May shower and wash wound with dial antibacterial soap and  water prior to dressing change. Cleanser: Wound Cleanser (DME) (Generic) 1 x Per Day/30 Days Discharge Instructions: Cleanse the wound with wound cleanser prior to applying a clean dressing using gauze sponges, not tissue or cotton balls. Prim Dressing: hydrogel wet-to-dry 1 x Per Day/30 Days ary Discharge Instructions: daily Secondary Dressing: Woven Gauze Sponge, Non-Sterile 4x4 in (DME) (Generic) 1 x Per Day/30 Days Discharge Instructions: Apply over primary dressing as directed. Secondary Dressing: ABD Pad, 5x9 (DME) (Generic) 1 x Per Day/30 Days Discharge Instructions: Apply over primary dressing as directed. Secured With: 32M Medipore H Soft Cloth Surgical T 4 x 2 (in/yd) (DME) (Generic) 1 x Per Day/30 Days ape Discharge Instructions: Secure dressing with tape as directed. #1 we talked this over and some detail. He is using hydrogel wet-to-dry dressings twice daily I told him I do not think twice daily is necessary and he can reduce this to daily 2. Whether this is filling in or not I am not sure. His depth is still in the same general level. Certainly not worse. I am asked him to continue the hydrogel wet- to-dry daily for another month. Based on this we could try changing his dressing perhaps Hydrofera Blue packing rope. I also had a thought about Oasis. 3. The patient tells me he is not that interested in considering flap closure emphasizing that the current wound is not affecting his daily activity 4. I see no evidence of an active infection. He last saw Dr. Johnnye Sima I think in March. He also has an L1-L2 discitis. Not sure what antibiotics he is on Electronic Signature(s) Signed:  10/24/2020 4:29:11 PM By: [name] MD Entered By: [name] on 10/24/2020 09:54:40 -------------------------------------------------------------------------------- [facility] Details Patient Name: Date of Service: Ivan [name], [name] 10/24/2020 9:00 A M Medical Record Number: [id number] Patient  Account Number: [id number] Date of Birth/Sex: Treating RN: 1946-05-08 (75 y.o. [name] Primary Care Provider: [name] Other Clinician: Referring Provider: Treating Provider/Extender: Fonnie Jarvis in Treatment: 0 Information Obtained From Patient Constitutional Symptoms (General Health) Complaints and Symptoms: Negative for: Fatigue; Fever; Chills; Marked Weight Change Eyes Complaints and Symptoms: Positive for: Glasses / Contacts - reading Negative for: Dry Eyes; Vision Changes Medical History: Positive for: Cataracts Negative for: Glaucoma; Optic Neuritis Ear/Nose/Mouth/Throat Complaints and Symptoms: Negative for: Chronic sinus problems or rhinitis Medical History: Negative for: Chronic sinus problems/congestion; Middle ear problems Respiratory Complaints and Symptoms: Negative for: Chronic or frequent coughs; Shortness of Breath Medical History: Negative for: Aspiration; Asthma; Chronic Obstructive Pulmonary Disease (COPD); Pneumothorax; Sleep Apnea; Tuberculosis Cardiovascular Complaints and Symptoms: Negative for: Chest pain Medical History: Positive for: Coronary Artery Disease; Hypertension; Myocardial Infarction Gastrointestinal Complaints and Symptoms: Negative for: Frequent diarrhea; Nausea; Vomiting Medical History: Negative for: Cirrhosis ; Colitis; Crohns; Hepatitis A; Hepatitis B; Hepatitis C Endocrine Complaints and Symptoms: Negative for: Heat/cold intolerance Medical History: Negative for: Type I Diabetes; Type II Diabetes Past Medical History Notes: testosterone deficiency Genitourinary Complaints and Symptoms: Negative for: Frequent urination Medical History: Negative for: End Stage Renal Disease Past Medical History Notes: BPH Integumentary (Skin) Complaints and Symptoms: Positive for: Wounds - left hip Medical History: Negative for: History of Burn Musculoskeletal Complaints and  Symptoms: Negative for: Muscle Pain; Muscle Weakness Medical History: Positive for: Osteoarthritis; Osteomyelitis Negative for: Gout; Rheumatoid Arthritis Neurologic Complaints and Symptoms: Negative for: Numbness/parasthesias Medical History: Negative for: Dementia; Neuropathy; Quadriplegia; Paraplegia; Seizure Disorder Psychiatric Complaints and Symptoms: Negative for: Claustrophobia; Suicidal Medical History: Negative for: Anorexia/bulimia; Confinement Anxiety Hematologic/Lymphatic Medical History: Negative for: Anemia; Hemophilia; Human Immunodeficiency Virus; Lymphedema; Sickle Cell Disease Immunological Medical History: Negative for: Lupus Erythematosus; Raynauds; Scleroderma Oncologic Medical History: Negative for: Received Chemotherapy; Received Radiation Past Medical History Notes: skin cancer HBO Extended History Items Eyes: Cataracts Immunizations Pneumococcal Vaccine: Received Pneumococcal Vaccination: Yes Implantable Devices None Hospitalization / Surgery History Type of Hospitalization/Surgery appendectomy cardiac catheterization cataract extraction cervical fusion CABG TURP cataract laser procedure Family and Social History Cancer: Yes - Paternal Grandparents; Diabetes: Yes - Paternal Grandparents; Heart Disease: No; Hereditary Spherocytosis: No; Hypertension: No; Kidney Disease: Yes - Maternal Grandparents; Lung Disease: No; Seizures: No; Stroke: No; Thyroid Problems: No; Tuberculosis: No; Former smoker - quit 5 years ago; Marital Status - Married; Alcohol Use: Rarely; Drug Use: No History; Caffeine Use: Daily; Financial Concerns: No; Food, Clothing or Shelter Needs: No; Support System Lacking: No; Transportation Concerns: No Engineer, maintenance) Signed: 10/24/2020 4:29:11 PM By: [name] MD Signed: 10/24/2020 6:10:01 PM By: Baruch Gouty RN, BSN Entered By: Baruch Gouty on 10/24/2020  09:15:30 -------------------------------------------------------------------------------- [place] Details Patient Name: Date of Service: Ivan [name], [name] 10/24/2020 Medical Record Number: [id number] Patient Account Number: [id number] Date of Birth/Sex: Treating RN: Feb 29, 1946 (75 y.o. [name] Primary Care Provider: [name] Other Clinician: Referring Provider: Treating Provider/Extender: Ronnie Derby, [name] in Treatment: 0 Diagnosis Coding ICD-10 Codes Code Description 9702536729 Pressure ulcer of left hip, stage 4 Facility Procedures CPT4 Code: [id number] Description: 99214 - WOUND CARE VISIT-LEV 4 EST PT Modifier: Quantity: 1 Physician Procedures : CPT4 Code Description Modifier [id number] - WC PHYS LEVEL 3 - EST  PT ICD-10 Diagnosis Description L89.224 Pressure ulcer of left hip, stage 4 Quantity: 1 Electronic Signature(s) Signed: 10/24/2020 4:29:11 PM By: [name] MD Signed: 10/24/2020 6:05:55 PM By: [name] Entered By: [name] on 10/24/2020 12:52:28

## 2020-11-01 ENCOUNTER — Encounter: Payer: Self-pay | Admitting: Family Medicine

## 2020-11-01 ENCOUNTER — Other Ambulatory Visit: Payer: Self-pay | Admitting: Infectious Diseases

## 2020-11-01 DIAGNOSIS — M86452 Chronic osteomyelitis with draining sinus, left femur: Secondary | ICD-10-CM

## 2020-11-21 ENCOUNTER — Encounter (HOSPITAL_BASED_OUTPATIENT_CLINIC_OR_DEPARTMENT_OTHER): Payer: Medicare HMO | Admitting: Internal Medicine

## 2020-11-22 ENCOUNTER — Other Ambulatory Visit: Payer: Medicare HMO

## 2020-11-23 ENCOUNTER — Other Ambulatory Visit: Payer: Self-pay

## 2020-11-23 ENCOUNTER — Encounter (HOSPITAL_BASED_OUTPATIENT_CLINIC_OR_DEPARTMENT_OTHER): Payer: Medicare HMO | Attending: Internal Medicine | Admitting: Internal Medicine

## 2020-11-23 DIAGNOSIS — L89224 Pressure ulcer of left hip, stage 4: Secondary | ICD-10-CM | POA: Insufficient documentation

## 2020-11-23 DIAGNOSIS — Z8616 Personal history of COVID-19: Secondary | ICD-10-CM | POA: Diagnosis not present

## 2020-11-23 NOTE — Progress Notes (Signed)
SHLOIME, KEILMAN ([id number]) Visit Report for 11/23/2020 HPI Details Patient Name: Date of Service: Ivan [name]. 11/23/2020 9:30 A M Medical Record Number: [id number] Patient Account Number: [id number] Date of Birth/Sex: Treating RN: Jun 05, 1945 (75 y.o. 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 He required admission to hospital from 1/5 through 1/9 with Covid pneumonia. At that point he apparently left AMA. He was noted to have a left hip bruise at the time of discharge. I suspect this really was a deep tissue injury. He was readmitted from 1/13 through 1//27 with altered mental status Covid pneumonia and delirium. At that point he had a left hip and thigh wound which had worsened. Felt to be a pressure injury. He was seen by general surgery who recommended normal saline wet-to-dry twice daily he did not have a debridement. After he left at that point he was incapacitated by increasing lumbar pain. An MRI on 2/9 showed discitis and osteomyelitis at L1/2. He asked aspiration of 1 cm fluid however I think this culture negative. He was also noted at this point to have a very large wound over the left greater trochanter. He was taken to the OR by Dr. Marla Roe for debridement with ACell placement. He was placed in a wound VAC. A culture at that point showed Enterococcus and Enterobacter. An MRI on 3/15 showed no evidence of left hip septic arthritis or osteomyelitis. Extensive subcutaneous edema within the visualized soft tissue of the left thigh and prominent left inguinal lymph nodes likely reactive. He was started on IV cefepime and vancomycin for 6 weeks. He was discharged to [place] facility as the patient.  There I gather the wound developed the necrotic surface. He was seen by the wound care doctor in house who correctly pointed out that a wound VAC would not do anything with a necrotic surface. Since then he has been using Santyl The patient is now at home. He was on vancomycin and cefepime. He developed hives and a generalized rash and was seen in the ER on 3/15 by Dr. Cain Sieve recommendation he was changed to meropenem however he also apparently developed hives after this. He says the original hives stopped after the cefepime was stopped and follow-up hives only developed after the meropenem. He is only taken 1 dose of the fear of the meropenem and now is on vancomycin. 3/25; the patient was seen by Dr. Linus Salmons on 3/22. Because of intolerances to meropenem which I communicated with Dr. De Burrs about he is now on daptomycin and Cipro until sometime in early April. He seems to be tolerating the antibiotics well. We are using Santyl back by wet-to-dry dressings. And making some improvement in the general surface of this very large wound 4/8; we are using Santyl back by wet-to-dry. We are making progress in terms of prepping the wound for a wound VAC. Still necrotic material requiring debridement. 4/16; we are using Santyl back by wet-to-dry. Not convinced that his wife is packing this properly there is a 6 cm tunnel at 12:00. We brought this to attention. Still requiring mechanical debridement 4/29; 2-week follow-up. In general the wound is better however there is considerable tunneling tunneling to the greater trochanter itself. There is still some debris in the deeper parts of this wound I  did not do mechanical debridement on this today. I am going to change the dressing to quarter strength Dakin's wet-to-dry. We will put a wound VAC through his insurance. His wife has had brain damage from a previous meningioma surgery. She is not comfortable around strangers in her home. The patient is concerned about  this 5/18; patient is using quarter strength Dakin's wet to dry. The problem is a deep pressure area over the left greater trochanter. There was some question from her intake nurses that this is actually come in and some question raised about whether he would require a wound VAC or not. The issue with the wound VAC was complicated predominantly by the patient's reluctance to allow people into his home i.e. home health although he tells me he has this mostly "sorted out" 7/1; this is a patient that I have not seen in almost 6 weeks. Apparently canceled several appointments related to family problems deaths in the family etc. He is wife has ophthalmologic surgery next week. I had been trying to get a wound VAC ordered for this deep probing wound over the left greater trochanter but so far I think he is simply doing a Dakin's wet-to-dry and our intake nurse did not think there was any Dakin's on it. I have a note from Dr. Tommy Medal of infectious disease on 6/30. He has now been off doxycycline and ciprofloxacin for several weeks. He has no undue pain or drainage. He also had discitis and osteomyelitis at L1-L2 with a 1 cm left psoas abscess as well as some increased signal at T10/T11. He is not having any problems related to this. 12/16/2019 on evaluation today patient's wound actually is showing some signs of bit of improvement which is good news. Fortunately there is no signs of active infection at this time which is good news. He tells me that the nurses have been coming to change this 3 times a week and doing a great job. He told the intake nurse that they had only been coming out once a week. I am not really sure what is going on with the discrepancy and stories here. Nonetheless I do believe that he needs to have this changed 3 times a week on a regular basis he does seem to have a little bit of a fungal infection around the wound. 8/19; 2-week follow-up. I have not seen this patient in over 6 weeks.  There is been a dramatic improvement in the overall wound volume. Circumference of the wound is now small although there is still considerable undermining from roughly 9-2 o'clock maximum 1:00 at roughly 4.7 cm. There is no purulent drainage. He has some irritation around the wound on the normal skin. They are using miconazole on this. 9/2; 2-week follow-up. Dimensions of the wound circumference look quite good. There is tunneling at 4.3 cm at 1:00 versus 4.7 last time. The area continues to look like it is contracting with the wound VAC and I think the treatment of choice here will be continuing to the wound VAC as long as it seems practical. There is no evidence of infection. As far as I am aware he is offloading this aggressively 9/16; 2-week follow-up. Left greater trochanter. Dimensions about the same still about 4.9 cm of maximal undermining. Nevertheless the visible tissue that I can see looks healthy. We have a wound VAC and really from the perspective of how this looks initially this is gotten a lot better however not much change in the last week  9/30 2-week follow-up. Left greater trochanter. He has a tunnel at 12:00 that goes about 7 cm. From 10-12 lesser degrees of undermining. HOWEVER he comes in today telling us that he is going to have to go back to work. He will not be able to work with a wound VAC and he wants that discontinued. There was not much discussion. This is a financial issue for him. He also has erythema around the wound orifice which has satellite lesions around it this is no doubt cutaneous candidiasis 10/12; 2-week follow-up. Left greater trochanter. 7.2 cm of tunneling. 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 He has not been systemically unwell. 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 He is using silver alginate which he is  dressing himself, ABDs with a large plastic occlusive dressing. Dimensions are somewhat better although this is an irregular wound. When he was here the last time he had a lot of drainage coming out of this wound. The culture I did showed methicillin sensitive staph aureus. I gave him 10 days of doxycycline and he is just finishing up on this. He states the drainage is a lot better 04/03/2020 upon evaluation today patient appears to be doing well with regard to his wound as far as the overall appearance is concerned the one issue that he is noting is that he does have some green drainage occurring at times. Obviously this brings up the concern for Pseudomonas I will see anything visually right now that has me overly concerned about at the same time that history does concern me. The patient would like to see if we can attempt to treat this today. I think that being that green drainage is pretty much pathognomonic for a Pseudomonas infection I think he can continue to doxycycline but probably should do a couple weeks of Cipro to try to see if that clears up the drainage if not then we may need to delve into a more extensive culture possibly PCR at that point. With regard to the wound itself also believe based on talking with the patient today that he could be a candidate for a flap surgery that something he would like to consider READMISSION 07/04/2020 Mr. Jacinto is a 75 year old man we had in clinic until the end of November 2021. He had a substantial wound on the left greater trochanter that required an original IandD by Dr. Marla Roe early in 2021. This was an infected wound and had IV antibiotics directed by infectious disease. He also had an L1-L2 discitis. We may have him in clinic here we initially put him in a wound VAC and he actually did quite well with this with filling in of the underlying tissue however he had to go back to work. 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 He is still working there 40 hours a week second shift. He was coming to this clinic until late in November I am not exactly sure what happened. I had not referred him to plastic surgery because he would have to inevitably take time off work that he was not prepared to do. The orifice of the wound was too small to really reconsider a wound VAC and that still true. He arrives back in clinic with a direct depth of the wound at 1.5 cm  that tunnel goes to 3.1 cm from 3.6 last time. He saw Dr. Johnnye Sima on 04/14/2020. He ordered a follow-up MRI. This showed persistence of a peritrochanteric fluid collection in the bursa which was slightly larger than the previous image. This communicated with the wounds. The patient still says he is having clear fluid drainage and he is changing his dressing twice a day. He is currently using predominantly half-strength Dakin's wet-to-dry and he correctly notes that the wound is clean and has come in significantly albeit slowly. He has not noticed any systemic symptoms. No real pain. Last measurements of his C-reactive protein and sedimentation rate were 3.7 and 2 respectively. He was supposed to follow-up with Dr. Johnnye Sima I am not sure that he has. Dr. Johnnye Sima had tried to organize an appointment with Dr. Marla Roe I do not think that ever got done either. The patient tells me he is not willing to consider any further surgery to this area until he has short-term disability time which he thinks will happen sometime in May or June 3/22; monthly follow-up. Minimal change in the important measurements of depth at 1.7 and the 12:00 tunnel at 2.6. We have been referred back to Dr. Marla Roe I think by Dr. Johnnye Sima although he does not yet have a plastics appointment 10/24/2020; patient dropped off our schedule when he returns to clinic with for review of the same wound over the left greater trochanter. He says the reason is because of medical issues including TUR of a benign  bladder tumor. He also had cataract issues. In any case he has been using a hydrogel wet-to-dry to his left hip wound. He is working full-time this does not limit him. Direct depth today measured at 2.1 with undermining from 8-12 also with 2.1. This represents some improvement. He tells me he never did see Dr. Marla Roe again because he could not coordinate another appointment. He is not really wanting to do the flap surgery type of thing he says this does not inhibit his lifestyle 7/14; 1 month follow-up he is using hydrogel wet-to-dry. The wound has 2 cm of direct depth and a 2.5 cm tunnel at 1:00 I am not sure that this has changed too much. Once again I have talked to him about whether he wants to try a different dressing Hydrofera Blue packing rope, polymen Ag rope etc. He does not seem at all interested in this. Wants to do the hydrogel wet-to-dry packing for another month Electronic Signature(s) Signed: 11/23/2020 4:30:53 PM By: [name] MD Entered By: [name] on 11/23/2020 10:12:51 -------------------------------------------------------------------------------- Physical Exam Details Patient Name: Date of Service: Ivan [name], Ivan BERT W. 11/23/2020 9:30 A M Medical Record Number: [id number] Patient Account Number: [id number] Date of Birth/Sex: Treating RN: [birth year] (75 y.o. Ivan [name] Primary Care Provider: [name] Other Clinician: Referring Provider: Treating Provider/Extender: Ronnie Derby, [name] in Treatment: 4 Constitutional Sitting or standing Blood Pressure is within target range for patient.. Pulse regular and within target range for patient.Marland Kitchen Respirations regular, non-labored and within target range.. Temperature is normal and within the target range for the patient.Marland Kitchen Appears in no distress. Notes Wound exam; right greater trochanter. Not much change very small wound orifice. 2 cm of direct depth which is about the same as last time. I  would say the undermining is improved versus my notes from last time here at which time I quoted undermining from 8-12 at 2.1. Clearly we have a tunnel now at 1:00 of 2.5 some of this  may have close down there is no purulent drainage no palpable tenderness no crepitus. Electronic Signature(s) Signed: 11/23/2020 4:30:53 PM By: [name] MD Entered By: [name] on 11/23/2020 10:14:24 -------------------------------------------------------------------------------- Physician Orders Details Patient Name: Date of Service: Ivan [name], Ivan BERT W. 11/23/2020 9:30 A M Medical Record Number: [id number] Patient Account Number: [id number] Date of Birth/Sex: Treating RN: 14-Jul-1945 (75 y.o. Ivan [name] Primary Care Provider: [name] Other Clinician: Referring Provider: Treating Provider/Extender: Fonnie Jarvis in Treatment: 4 Verbal / Phone Orders: No Diagnosis Coding ICD-10 Coding Code Description [phone] Pressure ulcer of left hip, stage 4 Follow-up Appointments Return appointment in 1 month. - Dr. Dellia Nims Bathing/ Shower/ Hygiene May shower with protection but do not get wound dressing(s) wet. Off-Loading Turn and reposition every 2 hours Wound Treatment Wound #3 - Trochanter Wound Laterality: Left Cleanser: Soap and Water 1 x Per Day/30 Days Discharge Instructions: May shower and wash wound with dial antibacterial soap and water prior to dressing change. Cleanser: Wound Cleanser (Generic) 1 x Per Day/30 Days Discharge Instructions: Cleanse the wound with wound cleanser prior to applying a clean dressing using gauze sponges, not tissue or cotton balls. Prim Dressing: hydrogel wet-to-dry 1 x Per Day/30 Days ary Discharge Instructions: daily Secondary Dressing: Woven Gauze Sponge, Non-Sterile 4x4 in (Generic) 1 x Per Day/30 Days Discharge Instructions: Apply over primary dressing as directed. Secondary Dressing: ABD Pad, 5x9 (DME) (Generic) 1 x Per  Day/30 Days Discharge Instructions: Apply over primary dressing as directed. Secured With: 43M Medipore H Soft Cloth Surgical T 4 x 2 (in/yd) (Generic) 1 x Per Day/30 Days ape Discharge Instructions: Secure dressing with tape as directed. Electronic Signature(s) Signed: 11/23/2020 4:30:53 PM By: [name] MD Signed: 11/23/2020 6:14:19 PM By: [name] Entered By: [name] on 11/23/2020 10:05:58 -------------------------------------------------------------------------------- Problem List Details Patient Name: Date of Service: Ivan [name], Ivan BERT W. 11/23/2020 9:30 A M Medical Record Number: [id number] Patient Account Number: [id number] Date of Birth/Sex: Treating RN: Apr 21, 1946 (75 y.o. Ivan [name] Primary Care Provider: [name] Other Clinician: Referring Provider: Treating Provider/Extender: Ronnie Derby, [name] in Treatment: 4 Active Problems ICD-10 Encounter Code Description Active Date MDM Diagnosis L89.224 Pressure ulcer of left hip, stage 4 10/24/2020 No Yes Inactive Problems Resolved Problems Electronic Signature(s) Signed: 11/23/2020 4:30:53 PM By: [name] MD Entered By: [name] on 11/23/2020 10:06:26 -------------------------------------------------------------------------------- Progress Note Details Patient Name: Date of Service: Ivan [name], Ivan BERT W. 11/23/2020 9:30 A M Medical Record Number: [id number] Patient Account Number: [id number] Date of Birth/Sex: Treating RN: 12-Mar-1946 (75 y.o. 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 He required admission to hospital from 1/5 through 1/9 with Covid pneumonia. At that point he apparently left  AMA. He was noted to have a left hip bruise at the time of discharge. I suspect this really was a deep tissue injury. He was readmitted from 1/13 through 1//27 with altered mental status Covid pneumonia and delirium. At that point he had a left hip and thigh wound which had worsened. Felt to be a pressure injury. He was seen by general surgery who recommended normal saline wet-to-dry twice daily he did not have a debridement. After he left at that point he was incapacitated by increasing lumbar pain. An MRI  on 2/9 showed discitis and osteomyelitis at L1/2. He asked aspiration of 1 cm fluid however I think this culture negative. He was also noted at this point to have a very large wound over the left greater trochanter. He was taken to the OR by Dr. Marla Roe for debridement with ACell placement. He was placed in a wound VAC. A culture at that point showed Enterococcus and Enterobacter. An MRI on 3/15 showed no evidence of left hip septic arthritis or osteomyelitis. Extensive subcutaneous edema within the visualized soft tissue of the left thigh and prominent left inguinal lymph nodes likely reactive. He was started on IV cefepime and vancomycin for 6 weeks. He was discharged to [facility] facility as the patient. There I gather the wound developed the necrotic surface. He was seen by the wound care doctor in house who correctly pointed out that a wound VAC would not do anything with a necrotic surface. Since then he has been using Santyl The patient is now at home. He was on vancomycin and cefepime. He developed hives and a generalized rash and was seen in the ER on 3/15 by Dr. Cain Sieve recommendation he was changed to meropenem however he also apparently developed hives after this. He says the original hives stopped after the cefepime was stopped and follow-up hives only developed after the meropenem. He is only taken 1 dose of the fear of the meropenem and now is on vancomycin. 3/25; the patient was  seen by Dr. Linus Salmons on 3/22. Because of intolerances to meropenem which I communicated with Dr. De Burrs about he is now on daptomycin and Cipro until sometime in early April. He seems to be tolerating the antibiotics well. We are using Santyl back by wet-to-dry dressings. And making some improvement in the general surface of this very large wound 4/8; we are using Santyl back by wet-to-dry. We are making progress in terms of prepping the wound for a wound VAC. Still necrotic material requiring debridement. 4/16; we are using Santyl back by wet-to-dry. Not convinced that his wife is packing this properly there is a 6 cm tunnel at 12:00. We brought this to attention. Still requiring mechanical debridement 4/29; 2-week follow-up. In general the wound is better however there is considerable tunneling tunneling to the greater trochanter itself. There is still some debris in the deeper parts of this wound I did not do mechanical debridement on this today. I am going to change the dressing to quarter strength Dakin's wet-to-dry. We will put a wound VAC through his insurance. His wife has had brain damage from a previous meningioma surgery. She is not comfortable around strangers in her home. The patient is concerned about this 5/18; patient is using quarter strength Dakin's wet to dry. The problem is a deep pressure area over the left greater trochanter. There was some question from her intake nurses that this is actually come in and some question raised about whether he would require a wound VAC or not. The issue with the wound VAC was complicated predominantly by the patient's reluctance to allow people into his home i.e. home health although he tells me he has this mostly "sorted out" 7/1; this is a patient that I have not seen in almost 6 weeks. Apparently canceled several appointments related to family problems deaths in the family etc. He is wife has ophthalmologic surgery next week. I had been trying to  get a wound VAC ordered for this deep probing wound over the left greater trochanter but so far  I think he is simply doing a Dakin's wet-to-dry and our intake nurse did not think there was any Dakin's on it. I have a note from Dr. Tommy Medal of infectious disease on 6/30. He has now been off doxycycline and ciprofloxacin for several weeks. He has no undue pain or drainage. He also had discitis and osteomyelitis at L1-L2 with a 1 cm left psoas abscess as well as some increased signal at T10/T11. He is not having any problems related to this. 12/16/2019 on evaluation today patient's wound actually is showing some signs of bit of improvement which is good news. Fortunately there is no signs of active infection at this time which is good news. He tells me that the nurses have been coming to change this 3 times a week and doing a great job. He told the intake nurse that they had only been coming out once a week. I am not really sure what is going on with the discrepancy and stories here. Nonetheless I do believe that he needs to have this changed 3 times a week on a regular basis he does seem to have a little bit of a fungal infection around the wound. 8/19; 2-week follow-up. I have not seen this patient in over 6 weeks. There is been a dramatic improvement in the overall wound volume. Circumference of the wound is now small although there is still considerable undermining from roughly 9-2 o'clock maximum 1:00 at roughly 4.7 cm. There is no purulent drainage. He has some irritation around the wound on the normal skin. They are using miconazole on this. 9/2; 2-week follow-up. Dimensions of the wound circumference look quite good. There is tunneling at 4.3 cm at 1:00 versus 4.7 last time. The area continues to look like it is contracting with the wound VAC and I think the treatment of choice here will be continuing to the wound VAC as long as it seems practical. There is no evidence of infection. As far as I am  aware he is offloading this aggressively 9/16; 2-week follow-up. Left greater trochanter. Dimensions about the same still about 4.9 cm of maximal undermining. Nevertheless the visible tissue that I can see looks healthy. We have a wound VAC and really from the perspective of how this looks initially this is gotten a lot better however not much change in the last week 9/30 2-week follow-up. Left greater trochanter. He has a tunnel at 12:00 that goes about 7 cm. From 10-12 lesser degrees of undermining. HOWEVER he comes in today telling us that he is going to have to go back to work. He will not be able to work with a wound VAC and he wants that discontinued. There was not much discussion. This is a financial issue for him. He also has erythema around the wound orifice which has satellite lesions around it this is no doubt cutaneous candidiasis 10/12; 2-week follow-up. Left greater trochanter. 7.2 cm of tunneling. 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 He has not been systemically unwell. 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 He is using silver alginate which he is dressing himself, ABDs with a large plastic occlusive dressing. Dimensions are somewhat better although this is an irregular wound. When he was here the last time he had a lot of drainage coming out of this wound. The culture  I did showed methicillin sensitive staph aureus. I gave him 10 days of doxycycline and he is just finishing up on this. He states the drainage is a lot better 04/03/2020 upon evaluation today patient appears to be doing well with regard to his wound as far as the overall appearance is concerned the one issue that he is noting is that he does have some green drainage occurring at times. Obviously this brings up the concern for Pseudomonas I will see anything visually  right now that has me overly concerned about at the same time that history does concern me. The patient would like to see if we can attempt to treat this today. I think that being that green drainage is pretty much pathognomonic for a Pseudomonas infection I think he can continue to doxycycline but probably should do a couple weeks of Cipro to try to see if that clears up the drainage if not then we may need to delve into a more extensive culture possibly PCR at that point. With regard to the wound itself also believe based on talking with the patient today that he could be a candidate for a flap surgery that something he would like to consider READMISSION 07/04/2020 Mr. Escandon is a 75 year old man we had in clinic until the end of November 2021. He had a substantial wound on the left greater trochanter that required an original IandD by Dr. Marla Roe early in 2021. This was an infected wound and had IV antibiotics directed by infectious disease. He also had an L1-L2 discitis. We may have him in clinic here we initially put him in a wound VAC and he actually did quite well with this with filling in of the underlying tissue however he had to go back to work. 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 He is still working there 40 hours a week second shift. He was coming to this clinic until late in November I am not exactly sure what happened. I had not referred him to plastic surgery because he would have to inevitably take time off work that he was not prepared to do. The orifice of the wound was too small to really reconsider a wound VAC and that still true. He arrives back in clinic with a direct depth of the wound at 1.5 cm that tunnel goes to 3.1 cm from 3.6 last time. He saw Dr. Johnnye Sima on 04/14/2020. He ordered a follow-up MRI. This showed persistence of a peritrochanteric fluid collection in the bursa which was slightly larger than the previous image. This communicated  with the wounds. The patient still says he is having clear fluid drainage and he is changing his dressing twice a day. He is currently using predominantly half-strength Dakin's wet-to-dry and he correctly notes that the wound is clean and has come in significantly albeit slowly. He has not noticed any systemic symptoms. No real pain. Last measurements of his C-reactive protein and sedimentation rate were 3.7 and 2 respectively. He was supposed to follow-up with Dr. Johnnye Sima I am not sure that he has. Dr. Johnnye Sima had tried to organize an appointment with Dr. Marla Roe I do not think that ever got done either. The patient tells me he is not willing to consider any further surgery to this area until he has short-term disability time which he thinks will happen sometime in May or June 3/22; monthly follow-up. Minimal change in the important measurements of depth at 1.7 and the 12:00 tunnel  at 2.6. We have been referred back to Dr. Marla Roe I think by Dr. Johnnye Sima although he does not yet have a plastics appointment 10/24/2020; patient dropped off our schedule when he returns to clinic with for review of the same wound over the left greater trochanter. He says the reason is because of medical issues including TUR of a benign bladder tumor. He also had cataract issues. In any case he has been using a hydrogel wet-to-dry to his left hip wound. He is working full-time this does not limit him. Direct depth today measured at 2.1 with undermining from 8-12 also with 2.1. This represents some improvement. He tells me he never did see Dr. Marla Roe again because he could not coordinate another appointment. He is not really wanting to do the flap surgery type of thing he says this does not inhibit his lifestyle 7/14; 1 month follow-up he is using hydrogel wet-to-dry. The wound has 2 cm of direct depth and a 2.5 cm tunnel at 1:00 I am not sure that this has changed too much. Once again I have talked to him about  whether he wants to try a different dressing Hydrofera Blue packing rope, polymen Ag rope etc. He does not seem at all interested in this. Wants to do the hydrogel wet-to-dry packing for another month Objective Constitutional Sitting or standing Blood Pressure is within target range for patient.. Pulse regular and within target range for patient.Marland Kitchen Respirations regular, non-labored and within target range.. Temperature is normal and within the target range for the patient.Marland Kitchen Appears in no distress. Vitals Time Taken: 9:45 AM, Height: 70 in, Weight: 187 lbs, BMI: 26.8, Temperature: 97.9 F, Pulse: 68 bpm, Respiratory Rate: 18 breaths/min, Blood Pressure: 127/64 mmHg. General Notes: Wound exam; right greater trochanter. Not much change very small wound orifice. 2 cm of direct depth which is about the same as last time. I would say the undermining is improved versus my notes from last time here at which time I quoted undermining from 8-12 at 2.1. Clearly we have a tunnel now at 1:00 of 2.5 some of this may have close down there is no purulent drainage no palpable tenderness no crepitus. Integumentary (Hair, Skin) Wound #3 status is Open. Original cause of wound was Pressure Injury. The date acquired was: 05/18/2019. The wound has been in treatment 4 weeks. The wound is located on the Left Trochanter. The wound measures 1.3cm length x 0.7cm width x 2cm depth; 0.715cm^2 area and 1.429cm^3 volume. There is Fat Layer (Subcutaneous Tissue) exposed. There is no undermining noted, however, there is tunneling at 1:00 with a maximum distance of 2.5cm. There is a medium amount of serosanguineous drainage noted. The wound margin is well defined and not attached to the wound base. There is large (67-100%) red granulation within the wound bed. There is no necrotic tissue within the wound bed. Assessment Active Problems ICD-10 Pressure ulcer of left hip, stage 4 Plan Follow-up Appointments: Return appointment in  1 month. - Dr. Dellia Nims Bathing/ Shower/ Hygiene: May shower with protection but do not get wound dressing(s) wet. Off-Loading: Turn and reposition every 2 hours WOUND #3: - Trochanter Wound Laterality: Left Cleanser: Soap and Water 1 x Per Day/30 Days Discharge Instructions: May shower and wash wound with dial antibacterial soap and water prior to dressing change. Cleanser: Wound Cleanser (Generic) 1 x Per Day/30 Days Discharge Instructions: Cleanse the wound with wound cleanser prior to applying a clean dressing using gauze sponges, not tissue or cotton balls. Prim Dressing:  hydrogel wet-to-dry 1 x Per Day/30 Days ary Discharge Instructions: daily Secondary Dressing: Woven Gauze Sponge, Non-Sterile 4x4 in (Generic) 1 x Per Day/30 Days Discharge Instructions: Apply over primary dressing as directed. Secondary Dressing: ABD Pad, 5x9 (DME) (Generic) 1 x Per Day/30 Days Discharge Instructions: Apply over primary dressing as directed. Secured With: 72M Medipore H Soft Cloth Surgical T 4 x 2 (in/yd) (Generic) 1 x Per Day/30 Days ape Discharge Instructions: Secure dressing with tape as directed. 1. Continuing with the hydrogel wet-to-dry at the patient's request. 2. There are other dressing options, consideration of plastic surgery etc. although he does not seem to want to pursue any of this. 3. 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 11/23/2020 Medical Record Number: [id number] Patient Account Number: [id number] Date of Birth/Sex: Treating RN: [birth year] (75 y.o. Ivan [name] Primary Care Provider: [name] Other Clinician: Referring Provider: Treating Provider/Extender: Ronnie Derby,  [name] in Treatment: 4 Diagnosis Coding ICD-10 Codes Code Description [phone] Pressure ulcer of left hip, stage 4 Facility Procedures Physician Procedures : CPT4 Code Description Modifier [id number] - WC PHYS LEVEL 3 - EST PT ICD-10 Diagnosis Description L89.224 Pressure ulcer of left hip, stage 4 Quantity: 1 Electronic Signature(s) Signed: 11/23/2020 4:30:53 PM By: [name] MD Signed: 11/23/2020 6:14:19 PM By: [name] Entered By: [name] on 11/23/2020 12:52:00

## 2020-11-23 NOTE — Progress Notes (Addendum)
[name], WILLIAMSON ([id number]) Visit Report for 11/23/2020 Arrival Information Details Patient Name: Date of Service: Ivan [name]. 11/23/2020 9:30 A M Medical Record Number: [id number] Patient Account Number: [id number] Date of Birth/Sex: Treating RN: May 23, 1945 (75 y.o. [name] Primary Care Jamilett Ferrante: [name] Other Clinician: Referring Carole Deere: Treating Alice Vitelli/Extender: Fonnie Jarvis in Treatment: 4 Visit Information History Since Last Visit Added or deleted any medications: No Patient Arrived: Ambulatory Any new allergies or adverse reactions: No Arrival Time: 09:45 Had a fall or experienced change in No Transfer Assistance: None activities of daily living that may affect Patient Identification Verified: Yes risk of falls: Secondary Verification Process Completed: Yes Signs or symptoms of abuse/neglect since last visito No Patient Requires Transmission-Based Precautions: No Hospitalized since last visit: No Patient Has Alerts: No Implantable device outside of the clinic excluding No cellular tissue based products placed in the center since last visit: Has Dressing in Place as Prescribed: Yes Pain Present Now: No Electronic Signature(s) Signed: 11/23/2020 6:01:46 PM By: [name] Entered By: [name] on 11/23/2020 09:45:22 -------------------------------------------------------------------------------- Clinic Level of Care Assessment Details Patient Name: Date of Service: Ivan [name], Ivan BERT W. 11/23/2020 9:30 A M Medical Record Number: [id number] Patient Account Number: [id number] Date of Birth/Sex: Treating RN: October 31, 1945 (75 y.o. [name] Primary Care Breean Nannini: [name] Other Clinician: Referring Yarelie Hams: Treating Danyal Whitenack/Extender: Ronnie Derby, [name] in Treatment: 4 Clinic Level of Care Assessment Items TOOL 4 Quantity Score X- 1 0 Use when only an EandM is performed on FOLLOW-UP  visit ASSESSMENTS - Nursing Assessment / Reassessment X- 1 10 Reassessment of Co-morbidities (includes updates in patient status) X- 1 5 Reassessment of Adherence to Treatment Plan ASSESSMENTS - Wound and Skin A ssessment / Reassessment X - Simple Wound Assessment / Reassessment - one wound 1 5 []  - 0 Complex Wound Assessment / Reassessment - multiple wounds X- 1 10 Dermatologic / Skin Assessment (not related to wound area) ASSESSMENTS - Focused Assessment []  - 0 Circumferential Edema Measurements - multi extremities X- 1 10 Nutritional Assessment / Counseling / Intervention []  - 0 Lower Extremity Assessment (monofilament, tuning fork, pulses) []  - 0 Peripheral Arterial Disease Assessment (using hand held doppler) ASSESSMENTS - Ostomy and/or Continence Assessment and Care []  - 0 Incontinence Assessment and Management []  - 0 Ostomy Care Assessment and Management (repouching, etc.) PROCESS - Coordination of Care X - Simple Patient / Family Education for ongoing care 1 15 []  - 0 Complex (extensive) Patient / Family Education for ongoing care X- 1 10 Staff obtains Programmer, systems, Records, T Results / Process Orders est []  - 0 Staff telephones HHA, Nursing Homes / Clarify orders / etc []  - 0 Routine Transfer to another Facility (non-emergent condition) []  - 0 Routine Hospital Admission (non-emergent condition) []  - 0 New Admissions / Biomedical engineer / Ordering NPWT Apligraf, etc. , []  - 0 Emergency Hospital Admission (emergent condition) X- 1 10 Simple Discharge Coordination []  - 0 Complex (extensive) Discharge Coordination PROCESS - Special Needs []  - 0 Pediatric / Minor Patient Management []  - 0 Isolation Patient Management []  - 0 Hearing / Language / Visual special needs []  - 0 Assessment of Community assistance (transportation, D/C planning, etc.) []  - 0 Additional assistance / Altered mentation []  - 0 Support Surface(s) Assessment (bed, cushion, seat,  etc.) INTERVENTIONS - Wound Cleansing / Measurement X - Simple Wound Cleansing - one wound 1 5 []  - 0 Complex Wound Cleansing - multiple wounds X-  1 5 Wound Imaging (photographs - any number of wounds) []  - 0 Wound Tracing (instead of photographs) X- 1 5 Simple Wound Measurement - one wound []  - 0 Complex Wound Measurement - multiple wounds INTERVENTIONS - Wound Dressings X - Small Wound Dressing one or multiple wounds 1 10 []  - 0 Medium Wound Dressing one or multiple wounds []  - 0 Large Wound Dressing one or multiple wounds []  - 0 Application of Medications - topical []  - 0 Application of Medications - injection INTERVENTIONS - Miscellaneous []  - 0 External ear exam []  - 0 Specimen Collection (cultures, biopsies, blood, body fluids, etc.) []  - 0 Specimen(s) / Culture(s) sent or taken to Lab for analysis []  - 0 Patient Transfer (multiple staff / Civil Service fast streamer / Similar devices) []  - 0 Simple Staple / Suture removal (25 or less) []  - 0 Complex Staple / Suture removal (26 or more) []  - 0 Hypo / Hyperglycemic Management (close monitor of Blood Glucose) []  - 0 Ankle / Brachial Index (ABI) - do not check if billed separately X- 1 5 Vital Signs Has the patient been seen at the hospital within the last three years: Yes Total Score: 105 Level Of Care: New/Established - Level 3 Electronic Signature(s) Signed: 11/23/2020 6:14:19 PM By: [name] Entered By: [name] on 11/23/2020 12:51:55 -------------------------------------------------------------------------------- Lower Extremity Assessment Details Patient Name: Date of Service: Ivan [name], Ivan BERT W. 11/23/2020 9:30 A M Medical Record Number: [id number] Patient Account Number: [id number] Date of Birth/Sex: Treating RN: [birth year] (75 y.o. [name] Primary Care Favour Aleshire: [name] Other Clinician: Referring Macee Venables: Treating Cale Bethard/Extender: Ronnie Derby, [name] in Treatment:  4 Electronic Signature(s) Signed: 11/23/2020 6:01:46 PM By: [name] Entered By: [name] on 11/23/2020 09:45:57 -------------------------------------------------------------------------------- Multi Wound Chart Details Patient Name: Date of Service: Ivan [name], Ivan BERT W. 11/23/2020 9:30 A M Medical Record Number: [id number] Patient Account Number: [id number] Date of Birth/Sex: Treating RN: May 27, 1945 (75 y.o. [name] Primary Care Meggen Spaziani: [name] Other Clinician: Referring Brycelyn Gambino: Treating Percy Comp/Extender: Ronnie Derby, [name] in Treatment: 4 Vital Signs Height(in): 70 Pulse(bpm): 68 Weight(lbs): 187 Blood Pressure(mmHg): 127/64 Body Mass Index(BMI): 27 Temperature(F): 97.9 Respiratory Rate(breaths/min): 18 Photos: [3:No Photos Left Trochanter] [N/A:N/A N/A] Wound Location: [3:Pressure Injury] [N/A:N/A] Wounding Event: [3:Pressure Ulcer] [N/A:N/A] Primary Etiology: [3:Cataracts, Coronary Artery Disease,] [N/A:N/A] Comorbid History: [3:Hypertension, Myocardial Infarction, Osteoarthritis, Osteomyelitis 05/18/2019] [N/A:N/A] Date Acquired: [3:4] [N/A:N/A] Weeks of Treatment: [3:Open] [N/A:N/A] Wound Status: [3:1.3x0.7x2] [N/A:N/A] Measurements L x W x D (cm) [3:0.715] [N/A:N/A] A (cm) : rea [3:1.429] [N/A:N/A] Volume (cm) : [3:0.00%] [N/A:N/A] % Reduction in A rea: [3:4.80%] [N/A:N/A] % Reduction in Volume: [3:1] Position 1 (o'clock): [3:2.5] Maximum Distance 1 (cm): [3:Yes] [N/A:N/A] Tunneling: [3:Category/Stage IV] [N/A:N/A] Classification: [3:Medium] [N/A:N/A] Exudate A mount: [3:Serosanguineous] [N/A:N/A] Exudate Type: [3:red, brown] [N/A:N/A] Exudate Color: [3:Well defined, not attached] [N/A:N/A] Wound Margin: [3:Large (67-100%)] [N/A:N/A] Granulation A mount: [3:Red] [N/A:N/A] Granulation Quality: [3:None Present (0%)] [N/A:N/A] Necrotic A mount: [3:Fat Layer (Subcutaneous Tissue): Yes N/A] Exposed  Structures: [3:Fascia: No Tendon: No Muscle: No Joint: No Bone: No] Treatment Notes Electronic Signature(s) Signed: 11/23/2020 4:30:53 PM By: [name] MD Signed: 11/23/2020 6:14:19 PM By: [name] Entered By: [name] on 11/23/2020 10:06:32 -------------------------------------------------------------------------------- Multi-Disciplinary Care Plan Details Patient Name: Date of Service: Ivan [name], Ivan BERT W. 11/23/2020 9:30 A M Medical Record Number: [id number] Patient Account Number: [id number] Date of Birth/Sex: Treating RN: March 18, 1946 (75 y.o. [name] Primary Care Lolly Glaus: [name] Other Clinician: Referring  Deniz Eskridge: Treating Danai Gotto/Extender: Ronnie Derby, [name] in Treatment: 4 Active Inactive Wound/Skin Impairment Nursing Diagnoses: Impaired tissue integrity Knowledge deficit related to ulceration/compromised skin integrity Goals: Patient will have a decrease in wound volume by X% from date: (specify in notes) Date Initiated: 10/24/2020 Target Resolution Date: 11/16/2020 Goal Status: Active Patient/caregiver will verbalize understanding of skin care regimen Date Initiated: 10/24/2020 Target Resolution Date: 11/16/2020 Goal Status: Active Ulcer/skin breakdown will have a volume reduction of 30% by week 4 Date Initiated: 10/24/2020 Date Inactivated: 11/23/2020 Target Resolution Date: 11/16/2020 Unmet Reason: see wound Goal Status: Unmet measurements. Interventions: Assess patient/caregiver ability to obtain necessary supplies Assess patient/caregiver ability to perform ulcer/skin care regimen upon admission and as needed Assess ulceration(s) every visit Notes: Electronic Signature(s) Signed: 11/23/2020 6:14:19 PM By: [name] Entered By: [name] on 11/23/2020 09:49:01 -------------------------------------------------------------------------------- Pain Assessment Details Patient Name: Date of Service: Ivan [name], Ivan  BERT W. 11/23/2020 9:30 A M Medical Record Number: [id number] Patient Account Number: [id number] Date of Birth/Sex: Treating RN: 07/20/1945 (75 y.o. [name] Primary Care Raniya Golembeski: [name] Other Clinician: Referring Stana Bayon: Treating Avarose Mervine/Extender: Ronnie Derby, [name] in Treatment: 4 Active Problems Location of Pain Severity and Description of Pain Patient Has Paino No Site Locations Pain Management and Medication Current Pain Management: Electronic Signature(s) Signed: 11/23/2020 6:01:46 PM By: [name] Entered By: [name] on 11/23/2020 09:45:50 -------------------------------------------------------------------------------- Patient/Caregiver Education Details Patient Name: Date of Service: [place] M, Ivan BERT W. 7/14/2022andnbsp9:30 A M Medical Record Number: [id number] Patient Account Number: [id number] Date of Birth/Gender: Treating RN: 1945/09/14 (75 y.o. [name] Primary Care Physician: [name] Other Clinician: Referring Physician: Treating Physician/Extender: Fonnie Jarvis in Treatment: 4 Education Assessment Education Provided To: Patient Education Topics Provided Wound/Skin Impairment: Handouts: Skin Care Do's and Dont's Methods: Explain/Verbal Responses: Reinforcements needed Electronic Signature(s) Signed: 11/23/2020 6:14:19 PM By: [name] Entered By: [name] on 11/23/2020 09:49:11 -------------------------------------------------------------------------------- Wound Assessment Details Patient Name: Date of Service: Ivan [name], Ivan BERT W. 11/23/2020 9:30 A M Medical Record Number: [id number] Patient Account Number: [id number] Date of Birth/Sex: Treating RN: 1945/05/28 (75 y.o. [name] Primary Care Saphronia Ozdemir: [name] Other Clinician: Referring Dash Cardarelli: Treating Yates Weisgerber/Extender: Ronnie Derby, [name] in Treatment: 4 Wound  Status Wound Number: 3 Primary Pressure Ulcer Etiology: Wound Location: Left Trochanter Wound Open Wounding Event: Pressure Injury Status: Date Acquired: 05/18/2019 Comorbid Cataracts, Coronary Artery Disease, Hypertension, Myocardial Weeks Of Treatment: 4 History: Infarction, Osteoarthritis, Osteomyelitis Clustered Wound: No Photos Wound Measurements Length: (cm) 1.3 Width: (cm) 0.7 Depth: (cm) 2 Area: (cm) 0.715 Volume: (cm) 1.429 % Reduction in Area: 0% % Reduction in Volume: 4.8% Tunneling: Yes Position (o'clock): 1 Maximum Distance: (cm) 2.5 Undermining: No Wound Description Classification: Category/Stage IV Wound Margin: Well defined, not attached Exudate Amount: Medium Exudate Type: Serosanguineous Exudate Color: red, brown Foul Odor After Cleansing: No Slough/Fibrino No Wound Bed Granulation Amount: Large (67-100%) Exposed Structure Granulation Quality: Red Fascia Exposed: No Necrotic Amount: None Present (0%) Fat Layer (Subcutaneous Tissue) Exposed: Yes Tendon Exposed: No Muscle Exposed: No Joint Exposed: No Bone Exposed: No Electronic Signature(s) Signed: 11/24/2020 12:14:30 PM By: [name] Signed: 11/27/2020 3:49:49 PM By: Sandre Kitty Signed: 11/27/2020 3:49:49 PM By: Sandre Kitty Previous Signature: 11/23/2020 6:01:46 PM Version By: [name] Previous Signature: 11/23/2020 6:14:19 PM Version By: [name] Entered By: Sandre Kitty on 11/24/2020 08:17:40 -------------------------------------------------------------------------------- Vitals Details Patient Name: Date of Service: Ivan [name], Ivan BERT W. 11/23/2020 9:30 A M Medical  Record Number: [id number] Patient Account Number: [id number] Date of Birth/Sex: Treating RN: 07-Nov-1945 (75 y.o. [name] Primary Care Zaakirah Kistner: [name] Other Clinician: Referring Lenny Bouchillon: Treating Horst Ostermiller/Extender: Ronnie Derby, [name] in Treatment: 4 Vital  Signs Time Taken: 09:45 Temperature (F): 97.9 Height (in): 70 Pulse (bpm): 68 Weight (lbs): 187 Respiratory Rate (breaths/min): 18 Body Mass Index (BMI): 26.8 Blood Pressure (mmHg): 127/64 Reference Range: 80 - 120 mg / dl Electronic Signature(s) Signed: 11/23/2020 6:01:46 PM By: [name] Entered By: [name] on 11/23/2020 09:45:43

## 2020-11-29 ENCOUNTER — Other Ambulatory Visit: Payer: Medicare HMO

## 2020-12-20 ENCOUNTER — Encounter (HOSPITAL_BASED_OUTPATIENT_CLINIC_OR_DEPARTMENT_OTHER): Payer: Medicare HMO | Admitting: Physician Assistant

## 2020-12-21 ENCOUNTER — Encounter (HOSPITAL_BASED_OUTPATIENT_CLINIC_OR_DEPARTMENT_OTHER): Payer: Medicare HMO | Admitting: Internal Medicine

## 2020-12-24 ENCOUNTER — Emergency Department (HOSPITAL_COMMUNITY): Payer: Medicare HMO

## 2020-12-24 ENCOUNTER — Emergency Department (HOSPITAL_COMMUNITY)
Admission: EM | Admit: 2020-12-24 | Discharge: 2020-12-24 | Disposition: A | Discharge status: Home / Self Care | Payer: Medicare HMO | Attending: Emergency Medicine | Admitting: Emergency Medicine

## 2020-12-24 ENCOUNTER — Other Ambulatory Visit: Payer: Self-pay

## 2020-12-24 DIAGNOSIS — R4182 Altered mental status, unspecified: Secondary | ICD-10-CM | POA: Diagnosis present

## 2020-12-24 DIAGNOSIS — F10129 Alcohol abuse with intoxication, unspecified: Secondary | ICD-10-CM | POA: Diagnosis present

## 2020-12-24 DIAGNOSIS — R519 Headache, unspecified: Secondary | ICD-10-CM | POA: Insufficient documentation

## 2020-12-24 DIAGNOSIS — W19XXXA Unspecified fall, initial encounter: Secondary | ICD-10-CM | POA: Diagnosis not present

## 2020-12-24 DIAGNOSIS — Y908 Blood alcohol level of 240 mg/100 ml or more: Secondary | ICD-10-CM | POA: Insufficient documentation

## 2020-12-24 DIAGNOSIS — S01111A Laceration without foreign body of right eyelid and periocular area, initial encounter: Secondary | ICD-10-CM | POA: Insufficient documentation

## 2020-12-24 DIAGNOSIS — F1010 Alcohol abuse, uncomplicated: Secondary | ICD-10-CM | POA: Insufficient documentation

## 2020-12-24 LAB — COMPREHENSIVE METABOLIC PANEL
ALT: 26 U/L (ref 0–44)
AST: 31 U/L (ref 15–41)
Albumin: 4.1 g/dL (ref 3.5–5.0)
Alkaline Phosphatase: 56 U/L (ref 38–126)
Anion gap: 12 (ref 5–15)
BUN: 26 mg/dL — ABNORMAL HIGH (ref 8–23)
CO2: 25 mmol/L (ref 22–32)
Calcium: 9.1 mg/dL (ref 8.9–10.3)
Chloride: 103 mmol/L (ref 98–111)
Creatinine, Ser: 0.82 mg/dL (ref 0.61–1.24)
GFR, Estimated: >60 mL/min (ref >60)
Glucose, Bld: 96 mg/dL (ref 70–99)
Potassium: 3.8 mmol/L (ref 3.5–5.1)
Sodium: 140 mmol/L (ref 135–145)
Total Bilirubin: 0.5 mg/dL (ref 0.3–1.2)
Total Protein: 6.8 g/dL (ref 6.5–8.1)

## 2020-12-24 LAB — CBC WITH DIFFERENTIAL/PLATELET
Abs Immature Granulocytes: 0.03 10*3/uL (ref 0.00–0.07)
Basophils Absolute: 0 10*3/uL (ref 0.0–0.1)
Basophils Relative: 0 %
Eosinophils Absolute: 0.4 10*3/uL (ref 0.0–0.5)
Eosinophils Relative: 5 %
HCT: 48.3 % (ref 39.0–52.0)
Hemoglobin: 15.7 g/dL (ref 13.0–17.0)
Immature Granulocytes: 0 %
Lymphocytes Relative: 21 %
Lymphs Abs: 1.8 10*3/uL (ref 0.7–4.0)
MCH: 32.2 pg (ref 26.0–34.0)
MCHC: 32.5 g/dL (ref 30.0–36.0)
MCV: 99 fL (ref 80.0–100.0)
Monocytes Absolute: 0.8 10*3/uL (ref 0.1–1.0)
Monocytes Relative: 9 %
Neutro Abs: 5.5 10*3/uL (ref 1.7–7.7)
Neutrophils Relative %: 65 %
Platelets: 246 10*3/uL (ref 150–400)
RBC: 4.88 MIL/uL (ref 4.22–5.81)
RDW: 13.1 % (ref 11.5–15.5)
WBC: 8.5 10*3/uL (ref 4.0–10.5)
nRBC: 0 % (ref 0.0–0.2)

## 2020-12-24 LAB — ETHANOL: Alcohol, Ethyl (B): 268 mg/dL — ABNORMAL HIGH (ref <10)

## 2020-12-24 LAB — URINALYSIS, ROUTINE W REFLEX MICROSCOPIC
Bilirubin Urine: NEGATIVE
Glucose, UA: NEGATIVE mg/dL
Hgb urine dipstick: NEGATIVE
Ketones, ur: 5 mg/dL — AB
Leukocytes,Ua: NEGATIVE
Nitrite: NEGATIVE
Protein, ur: NEGATIVE mg/dL
Specific Gravity, Urine: 1.013 (ref 1.005–1.030)
pH: 5 (ref 5.0–8.0)

## 2020-12-24 NOTE — ED Provider Notes (Signed)
Emergency Medicine Provider Triage Evaluation Note  Ivan [name] , a 75 y.o. male  was evaluated in triage.  Pt complains of fall.  Presents to ED by EMS for unwittnessed fall.  Found face down.  Pt reports he has been drinking and drinks daily.  Complains of headache.  No additional pain.  Review of Systems  Positive: HA Negative: CP, AP, back pain  Physical Exam  BP 125/63 (BP Location: Right Arm)   Pulse 67   Temp 98.7 F (37.1 C)   Resp 17   SpO2 97%  Gen:   Awake, no distress   Resp:  Normal effort  MSK:   Moves extremities without difficulty  Other:  Laceration over right eyebrow.  PERRL, EOMI.  Appears intoxicated  Medical Decision Making  Medically screening exam initiated at 5:16 AM.  Appropriate orders placed.  Ivan [name] was informed that the remainder of the evaluation will be completed by another provider, this initial triage assessment does not replace that evaluation, and the importance of remaining in the ED until their evaluation is complete.     Ivan Reichert, MD 12/24/20 [phone]

## 2020-12-24 NOTE — ED Notes (Signed)
Pt left AMA °

## 2020-12-24 NOTE — ED Triage Notes (Signed)
Pt from home by EMS post fall. Per report, pt's wife heard pt yell for help. Pt found lying prone in the bathroom. Unable to recall events. ETOH. A&Ox4,drowsy. laceration to R eyebrow, abrasion nose and upper lip. EMS VS 118/68, pulse 65, CBG 102. 20g L AC

## 2020-12-29 ENCOUNTER — Other Ambulatory Visit: Payer: Self-pay

## 2020-12-29 ENCOUNTER — Ambulatory Visit (INDEPENDENT_AMBULATORY_CARE_PROVIDER_SITE_OTHER): Payer: Medicare HMO | Admitting: Family Medicine

## 2020-12-29 VITALS — BP 141/77 | HR 73 | Ht 70.0 in | Wt 185.2 lb

## 2020-12-29 DIAGNOSIS — F0781 Postconcussional syndrome: Secondary | ICD-10-CM | POA: Diagnosis not present

## 2020-12-29 NOTE — Progress Notes (Signed)
    SUBJECTIVE:   CHIEF COMPLAINT / HPI:  headache  Unwitnessed fall on 08/14 after accidentally taking 1 Xanax and EtOH.  Reports wife found him.  Went to ED and work up included Chest xray negative, CT head /C-spine which was negative for acute hemorrhage or infarcts or acute fractures.  Left ED AMA without seeing provider.  Since then patient reports continued headaches.  He take Tramadol 100 mg and Gabapentin which seems to help.  Thinks headaches is attributed to head concussion.  Has been trying to rest.  Limited s reading to 30 mins yesterday.  Reports no EtOH or Xanax since episode occurred.  Denies any weakness, numbness or weakness of upper or lower extremities. Denies any chest pain, shortness of breath, visual disturbances or dizziness.   No further syncopal episodes.     PDMP reviewed  PERTINENT  PMH / PSH:  CAD s/p CABG LTB Hypogonadism, on Testosterone PTSD H/O Angioedema OBJECTIVE:   BP (!) 141/77   Pulse 73   Ht '5\' 10"'$  (1.778 m)   Wt 185 lb 3.2 oz (84 kg)   SpO2 98%   BMI 26.57 kg/m    General: Alert, no acute distress Cardio: Normal S1 and S2, RRR, no r/m/g Pulm: CTAB, normal work of breathing Abdomen: Bowel sounds normal. Abdomen soft and non-tender.  Extremities: No peripheral edema.  Neuro: CN II: PERRL CN III, IV,VI: EOMI CV V: Normal sensation in V1, V2, V3 CVII: Symmetric smile and brow raise CN VIII: Normal hearing CN IX,X: Symmetric palate raise  CN XI: 5/5 shoulder shrug CN XII: Symmetric tongue protrusion  UE and LE strength 5/5 2+ UE and LE reflexes  Normal sensation in UE and LE bilaterally  No ataxia with finger to nose, normal heel to shin  Negative Rhomberg     ASSESSMENT/PLAN:   Post concussion syndrome Unwitnessed fall and head trauma.  Continues to have mild headaches.  Low suspicion for brain bleed given negative workup in ED and neuro exam benign.   -Hydrate andSleep hygiene -Avoid EtOH -Decrease sensory stimulation -Work  note provided until Wed 08/24 -Follow up with PCP in 2 weeks or sooner if symptoms have not improved      Carollee Leitz, MD [place]

## 2020-12-29 NOTE — Patient Instructions (Addendum)
Thank you for coming to see me today. It was a pleasure. Today we talked about:   Continue Tramadol and Gabapentin Continue to increase fluids to ensure hydration  Please follow-up with Dr Owens Shark in 2-3 weeks  If you have any questions or concerns, please do not hesitate to call the office at [phone].  Best,   Carollee Leitz, MD         Concussion, Adult  A concussion is a brain injury from a hard, direct hit (trauma) to your head or body. This direct hit causes your brain to quickly shake back and forth inside your skull. A concussion may also be called a mild traumaticbrain injury (TBI). Healing from this injury can take time. What are the causes? This condition is caused by: A direct hit to your head, such as: Running into a player during a game. Being hit in a fight. Hitting your head on a hard surface. A quick and sudden movement of the head or neck, such as in a car crash. What are the signs or symptoms? The signs of a concussion can be hard to notice. They may be missed by you, family members, and doctors. You may look fine on the outside but may not actor feel normal. Physical symptoms Headaches. Being dizzy. Problems with body balance. Being sensitive to light or noise. Vomiting or feeling like you may vomit. Being tired. Problems seeing or hearing. Not sleeping or eating as you used to. Seizure. Mental and emotional symptoms Feeling grouchy (irritable). Having mood changes. Problems remembering things. Trouble focusing your mind (concentrating), organizing, or making decisions. Being slow to think, act, react, speak, or read. Feeling worried or nervous (anxious). Feeling sad (depressed). How is this treated? This condition may be treated by: Stopping sports or activity if you are injured. If you hit your head or have signs of concussion: Do not return to sports or activities the same day. Get checked by a doctor before you return to your  activities. Resting your body and your mind. Being watched carefully, often at home. Medicines to help with symptoms such as: Headaches. Feeling like you may vomit. Problems with sleep. Avoiding alcohol and drugs. Being asked to go to a concussion clinic or a place to help you recover (rehabilitation center). Recovery from a concussion can take time. Return to activities only: When you are fully healed. When your doctor says it is safe. Avoid taking strong pain medicines (opioids) for a concussion. Follow these instructions at home: Activity Limit activities that need a lot of thought or focus, such as: Homework or work for your job. Watching TV. Using the computer or phone. Playing memory games and puzzles. Rest. Rest helps your brain heal. Make sure you: Get plenty of sleep. Most adults should get 7-9 hours of sleep each night. Rest during the day. Take naps or breaks when you feel tired. Avoid activity like exercise until your doctor says its safe. Stop any activity that makes symptoms worse. Do not do activities that could cause a second concussion, such as riding a bike or playing sports. Ask your doctor when you can return to your normal activities, such as school, work, sports, and driving. Your ability to react may be slower. Do not do these activities if you are dizzy. General instructions  Take over-the-counter and prescription medicines only as told by your doctor. Do not drink alcohol until your doctor says you can. Watch your symptoms and tell other people to do the same. Other problems  can occur after a concussion. Older adults have a higher risk of serious problems. Tell your work Freight forwarder, teachers, Government social research officer, school counselor, coach, or Product/process development scientist about your injury and symptoms. Tell them about what you can or cannot do. Keep all follow-up visits as told by your doctor. This is important.  How is this prevented? It is very important that you do not get  another brain injury. In rare cases, another injury can cause brain damage that will not go away, brain swelling, or death. The risk of this is greatest in the first 7-10 days after a head injury. To avoid injuries: Stop activities that could lead to a second concussion, such as contact sports, until your doctor says it is okay. When you return to sports or activities: Do not crash into other players. This is how most concussions happen. Follow the rules. Respect other players. Do not engage in violent behavior while playing. Get regular exercise. Do strength and balance training. Wear a helmet that fits you well during sports, biking, or other activities. Helmets can help protect you from serious skull and brain injuries, but they do not protect you from a concussion. Even when wearing a helmet, you should avoid being hit in the head. Contact a doctor if: Your symptoms do not get better. You have new symptoms. You have another injury. Get help right away if: You have bad headaches or your headaches get worse. You feel weak or numb in any part of your body. You feel mixed up (confused). Your balance gets worse. You vomit often. You feel more sleepy than normal. You cannot speak well, or have slurred speech. You have a seizure. Others have trouble waking you up. You have changes in how you act. You have changes in how you see (vision). You pass out (lose consciousness). These symptoms may be an emergency. Do not wait to see if the symptoms will go away. Get medical help right away. Call your local emergency services (911 in the [place] Do not drive yourself to the hospital. Summary A concussion is a brain injury from a hard, direct hit (trauma) to your head or body. This condition is treated with rest and careful watching of symptoms. Ask your doctor when you can return to your normal activities, such as school, work, or driving. Get help right away if you have a very bad headache, feel  weak in any part of your body, have a seizure, have changes in how you act or see, or if you are mixed up or more sleepy than normal. This information is not intended to replace advice given to you by your health care provider. Make sure you discuss any questions you have with your healthcare provider. Document Revised: 03/11/2019 Document Reviewed: 03/11/2019 Elsevier Patient Education  [place].

## 2021-01-01 ENCOUNTER — Encounter: Payer: Self-pay | Admitting: Family Medicine

## 2021-01-01 DIAGNOSIS — F0781 Postconcussional syndrome: Secondary | ICD-10-CM | POA: Insufficient documentation

## 2021-01-01 NOTE — Assessment & Plan Note (Signed)
Unwitnessed fall and head trauma.  Continues to have mild headaches.  Low suspicion for brain bleed given negative workup in ED and neuro exam benign.   -Hydrate andSleep hygiene -Avoid EtOH -Decrease sensory stimulation -Work note provided until Wed 08/24 -Follow up with PCP in 2 weeks or sooner if symptoms have not improved

## 2021-01-04 ENCOUNTER — Encounter (HOSPITAL_BASED_OUTPATIENT_CLINIC_OR_DEPARTMENT_OTHER): Payer: Medicare HMO | Attending: Physician Assistant | Admitting: Internal Medicine

## 2021-01-04 ENCOUNTER — Other Ambulatory Visit: Payer: Self-pay

## 2021-01-04 DIAGNOSIS — I252 Old myocardial infarction: Secondary | ICD-10-CM | POA: Diagnosis not present

## 2021-01-04 DIAGNOSIS — L89224 Pressure ulcer of left hip, stage 4: Secondary | ICD-10-CM | POA: Insufficient documentation

## 2021-01-04 DIAGNOSIS — I10 Essential (primary) hypertension: Secondary | ICD-10-CM | POA: Diagnosis not present

## 2021-01-04 DIAGNOSIS — Z8619 Personal history of other infectious and parasitic diseases: Secondary | ICD-10-CM | POA: Insufficient documentation

## 2021-01-04 DIAGNOSIS — Z8616 Personal history of COVID-19: Secondary | ICD-10-CM | POA: Insufficient documentation

## 2021-01-04 DIAGNOSIS — I2510 Atherosclerotic heart disease of native coronary artery without angina pectoris: Secondary | ICD-10-CM | POA: Insufficient documentation

## 2021-01-04 NOTE — Progress Notes (Addendum)
Ivan, [name] ([id number]) Visit Report for 01/04/2021 Arrival Information Details Patient Name: Date of Service: Ivan [name]. 01/04/2021 9:15 A M Medical Record Number: [id number] Patient Account Number: [id number] Date of Birth/Sex: Treating RN: 17-Apr-1946 (75 y.o. [name] Primary Care Jeiry Birnbaum: [name] Other Clinician: Referring Ladamien Rammel: Treating Eliyana Pagliaro/Extender: Fonnie Jarvis in Treatment: 10 Visit Information History Since Last Visit Added or deleted any medications: No Patient Arrived: Ambulatory Any new allergies or adverse reactions: No Arrival Time: 09:26 Had a fall or experienced change in No Transfer Assistance: None activities of daily living that may affect Patient Identification Verified: Yes risk of falls: Secondary Verification Process Completed: Yes Signs or symptoms of abuse/neglect since last visito No Patient Requires Transmission-Based Precautions: No Hospitalized since last visit: No Patient Has Alerts: No Implantable device outside of the clinic excluding No cellular tissue based products placed in the center since last visit: Has Dressing in Place as Prescribed: Yes Pain Present Now: No Electronic Signature(s) Signed: 01/04/2021 5:23:24 PM By: [name] Entered By: [name] on 01/04/2021 02:72:53 -------------------------------------------------------------------------------- Clinic Level of Care Assessment Details Patient Name: Date of Service: Ivan [name], Ivan BERT W. 01/04/2021 9:15 A M Medical Record Number: [id number] Patient Account Number: [id number] Date of Birth/Sex: Treating RN: 1945/08/10 (75 y.o. [name] Primary Care Jeananne Bedwell: [name] Other Clinician: Referring Jerzee Jerome: Treating Michaelah Credeur/Extender: Ronnie Derby, [name] in Treatment: 10 Clinic Level of Care Assessment Items TOOL 4 Quantity Score X- 1 0 Use when only an EandM is performed on FOLLOW-UP  visit ASSESSMENTS - Nursing Assessment / Reassessment X- 1 10 Reassessment of Co-morbidities (includes updates in patient status) X- 1 5 Reassessment of Adherence to Treatment Plan ASSESSMENTS - Wound and Skin A ssessment / Reassessment X - Simple Wound Assessment / Reassessment - one wound 1 5 []  - 0 Complex Wound Assessment / Reassessment - multiple wounds []  - 0 Dermatologic / Skin Assessment (not related to wound area) ASSESSMENTS - Focused Assessment []  - 0 Circumferential Edema Measurements - multi extremities []  - 0 Nutritional Assessment / Counseling / Intervention []  - 0 Lower Extremity Assessment (monofilament, tuning fork, pulses) []  - 0 Peripheral Arterial Disease Assessment (using hand held doppler) ASSESSMENTS - Ostomy and/or Continence Assessment and Care []  - 0 Incontinence Assessment and Management []  - 0 Ostomy Care Assessment and Management (repouching, etc.) PROCESS - Coordination of Care []  - 0 Simple Patient / Family Education for ongoing care X- 1 20 Complex (extensive) Patient / Family Education for ongoing care X- 1 10 Staff obtains Programmer, systems, Records, T Results / Process Orders est []  - 0 Staff telephones HHA, Nursing Homes / Clarify orders / etc []  - 0 Routine Transfer to another Facility (non-emergent condition) []  - 0 Routine Hospital Admission (non-emergent condition) []  - 0 New Admissions / Biomedical engineer / Ordering NPWT Apligraf, etc. , []  - 0 Emergency Hospital Admission (emergent condition) []  - 0 Simple Discharge Coordination []  - 0 Complex (extensive) Discharge Coordination PROCESS - Special Needs []  - 0 Pediatric / Minor Patient Management []  - 0 Isolation Patient Management []  - 0 Hearing / Language / Visual special needs []  - 0 Assessment of Community assistance (transportation, D/C planning, etc.) []  - 0 Additional assistance / Altered mentation []  - 0 Support Surface(s) Assessment (bed, cushion, seat,  etc.) INTERVENTIONS - Wound Cleansing / Measurement X - Simple Wound Cleansing - one wound 1 5 []  - 0 Complex Wound Cleansing - multiple wounds X- 1  5 Wound Imaging (photographs - any number of wounds) []  - 0 Wound Tracing (instead of photographs) X- 1 5 Simple Wound Measurement - one wound []  - 0 Complex Wound Measurement - multiple wounds INTERVENTIONS - Wound Dressings []  - 0 Small Wound Dressing one or multiple wounds X- 1 15 Medium Wound Dressing one or multiple wounds []  - 0 Large Wound Dressing one or multiple wounds X- 1 5 Application of Medications - topical []  - 0 Application of Medications - injection INTERVENTIONS - Miscellaneous []  - 0 External ear exam []  - 0 Specimen Collection (cultures, biopsies, blood, body fluids, etc.) []  - 0 Specimen(s) / Culture(s) sent or taken to Lab for analysis []  - 0 Patient Transfer (multiple staff / Civil Service fast streamer / Similar devices) []  - 0 Simple Staple / Suture removal (25 or less) []  - 0 Complex Staple / Suture removal (26 or more) []  - 0 Hypo / Hyperglycemic Management (close monitor of Blood Glucose) []  - 0 Ankle / Brachial Index (ABI) - do not check if billed separately X- 1 5 Vital Signs Has the patient been seen at the hospital within the last three years: Yes Total Score: 90 Level Of Care: New/Established - Level 3 Electronic Signature(s) Signed: 01/04/2021 5:23:24 PM By: [name] Entered By: [name] on 01/04/2021 09:37:48 -------------------------------------------------------------------------------- Encounter Discharge Information Details Patient Name: Date of Service: Ivan [name], Ivan BERT W. 01/04/2021 9:15 A M Medical Record Number: [id number] Patient Account Number: [id number] Date of Birth/Sex: Treating RN: 1945/12/08 (75 y.o. [name] Primary Care Anniemae Haberkorn: [name] Other Clinician: Referring Dencil Cayson: Treating Riely Baskett/Extender: Fonnie Jarvis in Treatment:  10 Encounter Discharge Information Items Discharge Condition: Stable Ambulatory Status: Ambulatory Discharge Destination: Home Transportation: Private Auto Schedule Follow-up Appointment: Yes Clinical Summary of Care: Provided on 01/04/2021 Form Type Recipient Paper Patient Patient Electronic Signature(s) Signed: 01/04/2021 5:23:24 PM By: [name] Entered By: [name] on 01/04/2021 09:48:07 -------------------------------------------------------------------------------- Lower Extremity Assessment Details Patient Name: Date of Service: Ivan [name], Ivan BERT W. 01/04/2021 9:15 A M Medical Record Number: [id number] Patient Account Number: [id number] Date of Birth/Sex: Treating RN: 06-20-1945 (75 y.o. [name] Primary Care Edyn Qazi: [name] Other Clinician: Referring Treshon Stannard: Treating Willaim Mode/Extender: Ronnie Derby, [name] in Treatment: 10 Electronic Signature(s) Signed: 01/04/2021 5:23:24 PM By: [name] Entered By: [name] on 01/04/2021 09:27:28 -------------------------------------------------------------------------------- Multi Wound Chart Details Patient Name: Date of Service: Ivan [name], Ivan BERT W. 01/04/2021 9:15 A M Medical Record Number: [id number] Patient Account Number: [id number] Date of Birth/Sex: Treating RN: 1945/08/21 (75 y.o. [name] Primary Care Neeti Knudtson: [name] Other Clinician: Referring Jamear Carbonneau: Treating Marylan Glore/Extender: Ronnie Derby, [name] in Treatment: 10 Vital Signs Height(in): 70 Pulse(bpm): 27 Weight(lbs): 187 Blood Pressure(mmHg): 157/78 Body Mass Index(BMI): 27 Temperature(F): 98.6 Respiratory Rate(breaths/min): 18 Photos: [N/A:N/A] Left Trochanter N/A N/A Wound Location: Pressure Injury N/A N/A Wounding Event: Pressure Ulcer N/A N/A Primary Etiology: Cataracts, Coronary Artery Disease, N/A N/A Comorbid History: Hypertension, Myocardial  Infarction, Osteoarthritis, Osteomyelitis 05/18/2019 N/A N/A Date Acquired: 10 N/A N/A Weeks of Treatment: Open N/A N/A Wound Status: 1x0.4x1.7 N/A N/A Measurements L x W x D (cm) 0.314 N/A N/A A (cm) : rea 0.534 N/A N/A Volume (cm) : 56.10% N/A N/A % Reduction in A rea: 64.40% N/A N/A % Reduction in Volume: 10 Position 1 (o'clock): 2 Maximum Distance 1 (cm): 8 Starting Position 1 (o'clock): 10 Ending Position 1 (o'clock): 1.5 Maximum Distance 1 (cm): Yes N/A N/A Tunneling: Yes N/A  N/A Undermining: Category/Stage IV N/A N/A Classification: Medium N/A N/A Exudate A mount: Serosanguineous N/A N/A Exudate Type: red, brown N/A N/A Exudate Color: Well defined, not attached N/A N/A Wound Margin: Large (67-100%) N/A N/A Granulation A mount: Red N/A N/A Granulation Quality: None Present (0%) N/A N/A Necrotic A mount: Fat Layer (Subcutaneous Tissue): Yes N/A N/A Exposed Structures: Fascia: No Tendon: No Muscle: No Joint: No Bone: No Medium (34-66%) N/A N/A Epithelialization: Periwound irritation N/A N/A Assessment Notes: Treatment Notes Electronic Signature(s) Signed: 01/04/2021 5:23:24 PM By: [name] Signed: 01/05/2021 7:36:59 AM By: [name] MD Entered By: [name] on 01/04/2021 09:41:28 -------------------------------------------------------------------------------- Multi-Disciplinary Care Plan Details Patient Name: Date of Service: Ivan [name], Ivan BERT W. 01/04/2021 9:15 A M Medical Record Number: [id number] Patient Account Number: [id number] Date of Birth/Sex: Treating RN: September 09, 1945 (75 y.o. [name] Primary Care Vance Hochmuth: [name] Other Clinician: Referring Dorien Mayotte: Treating Berkeley Vanaken/Extender: Ronnie Derby, [name] in Treatment: 10 Active Inactive Wound/Skin Impairment Nursing Diagnoses: Impaired tissue integrity Knowledge deficit related to ulceration/compromised skin  integrity Goals: Patient will have a decrease in wound volume by X% from date: (specify in notes) Date Initiated: 10/24/2020 Target Resolution Date: 02/01/2021 Goal Status: Active Patient/caregiver will verbalize understanding of skin care regimen Date Initiated: 10/24/2020 Date Inactivated: 01/04/2021 Target Resolution Date: 11/16/2020 Goal Status: Met Ulcer/skin breakdown will have a volume reduction of 30% by week 4 Date Initiated: 10/24/2020 Date Inactivated: 11/23/2020 Target Resolution Date: 11/16/2020 Unmet Reason: see wound Goal Status: Unmet measurements. Interventions: Assess patient/caregiver ability to obtain necessary supplies Assess patient/caregiver ability to perform ulcer/skin care regimen upon admission and as needed Assess ulceration(s) every visit Notes: Electronic Signature(s) Signed: 01/04/2021 9:24:54 AM By: [name] Entered By: [name] on 01/04/2021 09:24:54 -------------------------------------------------------------------------------- Pain Assessment Details Patient Name: Date of Service: Ivan [name], Ivan BERT W. 01/04/2021 9:15 A M Medical Record Number: [id number] Patient Account Number: [id number] Date of Birth/Sex: Treating RN: Jul 02, 1945 (75 y.o. [name] Primary Care Tiya Schrupp: [name] Other Clinician: Referring Kylee Umana: Treating Jemell Town/Extender: Ronnie Derby, [name] in Treatment: 10 Active Problems Location of Pain Severity and Description of Pain Patient Has Paino No Site Locations Pain Management and Medication Current Pain Management: Electronic Signature(s) Signed: 01/04/2021 5:23:24 PM By: [name] Entered By: [name] on 01/04/2021 09:27:13 -------------------------------------------------------------------------------- Patient/Caregiver Education Details Patient Name: Date of Service: Ivan [name] 8/25/2022andnbsp9:15 A M Medical Record Number: [id number] Patient Account  Number: [id number] Date of Birth/Gender: Treating RN: Dec 06, 1945 (75 y.o. [name] Primary Care Physician: [name] Other Clinician: Referring Physician: Treating Physician/Extender: Fonnie Jarvis in Treatment: 10 Education Assessment Education Provided To: Patient Education Topics Provided Wound/Skin Impairment: Methods: Demonstration, Explain/Verbal, Printed Responses: State content correctly Electronic Signature(s) Signed: 01/04/2021 5:23:24 PM By: [name] Entered By: [name] on 01/04/2021 09:25:12 -------------------------------------------------------------------------------- Wound Assessment Details Patient Name: Date of Service: Ivan [name], Ivan BERT W. 01/04/2021 9:15 A M Medical Record Number: [id number] Patient Account Number: [id number] Date of Birth/Sex: Treating RN: 1945-11-25 (75 y.o. [name] Primary Care Manas Hickling: [name] Other Clinician: Referring Westyn Driggers: Treating Teandre Hamre/Extender: Ronnie Derby, [name] in Treatment: 10 Wound Status Wound Number: 3 Primary Pressure Ulcer Etiology: Wound Location: Left Trochanter Wound Open Wounding Event: Pressure Injury Status: Date Acquired: 05/18/2019 Comorbid Cataracts, Coronary Artery Disease, Hypertension, Myocardial Weeks Of Treatment: 10 History: Infarction, Osteoarthritis, Osteomyelitis Clustered Wound: No Photos Wound Measurements Length: (cm) 1 Width: (cm) 0.4 Depth: (cm) 1.7 Area: (cm) 0.314 Volume: (  cm) 0.534 % Reduction in Area: 56.1% % Reduction in Volume: 64.4% Epithelialization: Medium (34-66%) Tunneling: Yes Position (o'clock): 10 Maximum Distance: (cm) 2 Undermining: Yes Starting Position (o'clock): 8 Ending Position (o'clock): 10 Maximum Distance: (cm) 1.5 Wound Description Classification: Category/Stage IV Wound Margin: Well defined, not attached Exudate Amount: Medium Exudate Type: Serosanguineous Exudate  Color: red, brown Foul Odor After Cleansing: No Slough/Fibrino No Wound Bed Granulation Amount: Large (67-100%) Exposed Structure Granulation Quality: Red Fascia Exposed: No Necrotic Amount: None Present (0%) Fat Layer (Subcutaneous Tissue) Exposed: Yes Tendon Exposed: No Muscle Exposed: No Joint Exposed: No Bone Exposed: No Assessment Notes Periwound irritation Treatment Notes Wound #3 (Trochanter) Wound Laterality: Left Cleanser Soap and Water Discharge Instruction: May shower and wash wound with dial antibacterial soap and water prior to dressing change. Wound Cleanser Discharge Instruction: Cleanse the wound with wound cleanser prior to applying a clean dressing using gauze sponges, not tissue or cotton balls. Peri-Wound Care Topical Skintegrity Hydrogel 4 (oz) Discharge Instruction: Apply hydrogel as directed Primary Dressing Secondary Dressing ABD Pad, 5x9 Discharge Instruction: Apply over primary dressing as directed. Secured With Child psychotherapist, Sterile 2x75 (in/in) Discharge Instruction: Cut and moisten with Hydrogel, pack into wound 41M Medipore H Soft Cloth Surgical T 4 x 2 (in/yd) ape Discharge Instruction: Secure dressing with tape as directed. Compression Wrap Compression Stockings Add-Ons Electronic Signature(s) Signed: 01/04/2021 5:23:24 PM By: [name] Entered By: [name] on 01/04/2021 09:35:16 -------------------------------------------------------------------------------- Vitals Details Patient Name: Date of Service: Ivan [name], Ivan BERT W. 01/04/2021 9:15 A M Medical Record Number: [id number] Patient Account Number: [id number] Date of Birth/Sex: Treating RN: Dec 24, 1945 (75 y.o. [name] Primary Care Elizabelle Fite: [name] Other Clinician: Referring Fannie Gathright: Treating Abbi Mancini/Extender: Ronnie Derby, [name] in Treatment: 10 Vital Signs Time Taken: 09:26 Temperature (F): 98.6 Height (in):  70 Pulse (bpm): 73 Weight (lbs): 187 Respiratory Rate (breaths/min): 18 Body Mass Index (BMI): 26.8 Blood Pressure (mmHg): 157/78 Reference Range: 80 - 120 mg / dl Electronic Signature(s) Signed: 01/04/2021 5:23:24 PM By: [name] Entered By: [name] on 01/04/2021 09:27:05

## 2021-01-05 NOTE — Progress Notes (Signed)
DYMOND, EYE ([id number]) Visit Report for 01/04/2021 HPI Details Patient Name: Date of Service: Ivan [name]. 01/04/2021 9:15 A M Medical Record Number: [id number] Patient Account Number: [id number] Date of Birth/Sex: Treating RN: [birth year] (75 y.o. 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 He required admission to hospital from 1/5 through 1/9 with Covid pneumonia. At that point he apparently left AMA. He was noted to have a left hip bruise at the time of discharge. I suspect this really was a deep tissue injury. He was readmitted from 1/13 through 1//27 with altered mental status Covid pneumonia and delirium. At that point he had a left hip and thigh wound which had worsened. Felt to be a pressure injury. He was seen by general surgery who recommended normal saline wet-to-dry twice daily he did not have a debridement. After he left at that point he was incapacitated by increasing lumbar pain. An MRI on 2/9 showed discitis and osteomyelitis at L1/2. He asked aspiration of 1 cm fluid however I think this culture negative. He was also noted at this point to have a very large wound over the left greater trochanter. He was taken to the OR by Dr. Marla Roe for debridement with ACell placement. He was placed in a wound VAC. A culture at that point showed Enterococcus and Enterobacter. An MRI on 3/15 showed no evidence of left hip septic arthritis or osteomyelitis. Extensive subcutaneous edema within the visualized soft tissue of the left thigh and prominent left inguinal lymph nodes likely reactive. He was started on IV cefepime and vancomycin for 6 weeks. He was discharged to [facility] facility as the patient.  There I gather the wound developed the necrotic surface. He was seen by the wound care doctor in house who correctly pointed out that a wound VAC would not do anything with a necrotic surface. Since then he has been using Santyl The patient is now at home. He was on vancomycin and cefepime. He developed hives and a generalized rash and was seen in the ER on 3/15 by Dr. Cain Sieve recommendation he was changed to meropenem however he also apparently developed hives after this. He says the original hives stopped after the cefepime was stopped and follow-up hives only developed after the meropenem. He is only taken 1 dose of the fear of the meropenem and now is on vancomycin. 3/25; the patient was seen by Dr. Linus Salmons on 3/22. Because of intolerances to meropenem which I communicated with Dr. De Burrs about he is now on daptomycin and Cipro until sometime in early April. He seems to be tolerating the antibiotics well. We are using Santyl back by wet-to-dry dressings. And making some improvement in the general surface of this very large wound 4/8; we are using Santyl back by wet-to-dry. We are making progress in terms of prepping the wound for a wound VAC. Still necrotic material requiring debridement. 4/16; we are using Santyl back by wet-to-dry. Not convinced that his wife is packing this properly there is a 6 cm tunnel at 12:00. We brought this to attention. Still requiring mechanical debridement 4/29; 2-week follow-up. In general the wound is better however there is considerable tunneling tunneling to the greater trochanter itself. There is still some debris in the deeper parts of this wound I  did not do mechanical debridement on this today. I am going to change the dressing to quarter strength Dakin's wet-to-dry. We will put a wound VAC through his insurance. His wife has had brain damage from a previous meningioma surgery. She is not comfortable around strangers in her home. The patient is concerned about  this 5/18; patient is using quarter strength Dakin's wet to dry. The problem is a deep pressure area over the left greater trochanter. There was some question from her intake nurses that this is actually come in and some question raised about whether he would require a wound VAC or not. The issue with the wound VAC was complicated predominantly by the patient's reluctance to allow people into his home i.e. home health although he tells me he has this mostly "sorted out" 7/1; this is a patient that I have not seen in almost 6 weeks. Apparently canceled several appointments related to family problems deaths in the family etc. He is wife has ophthalmologic surgery next week. I had been trying to get a wound VAC ordered for this deep probing wound over the left greater trochanter but so far I think he is simply doing a Dakin's wet-to-dry and our intake nurse did not think there was any Dakin's on it. I have a note from Dr. Tommy Medal of infectious disease on 6/30. He has now been off doxycycline and ciprofloxacin for several weeks. He has no undue pain or drainage. He also had discitis and osteomyelitis at L1-L2 with a 1 cm left psoas abscess as well as some increased signal at T10/T11. He is not having any problems related to this. 12/16/2019 on evaluation today patient's wound actually is showing some signs of bit of improvement which is good news. Fortunately there is no signs of active infection at this time which is good news. He tells me that the nurses have been coming to change this 3 times a week and doing a great job. He told the intake nurse that they had only been coming out once a week. I am not really sure what is going on with the discrepancy and stories here. Nonetheless I do believe that he needs to have this changed 3 times a week on a regular basis he does seem to have a little bit of a fungal infection around the wound. 8/19; 2-week follow-up. I have not seen this patient in over 6 weeks.  There is been a dramatic improvement in the overall wound volume. Circumference of the wound is now small although there is still considerable undermining from roughly 9-2 o'clock maximum 1:00 at roughly 4.7 cm. There is no purulent drainage. He has some irritation around the wound on the normal skin. They are using miconazole on this. 9/2; 2-week follow-up. Dimensions of the wound circumference look quite good. There is tunneling at 4.3 cm at 1:00 versus 4.7 last time. The area continues to look like it is contracting with the wound VAC and I think the treatment of choice here will be continuing to the wound VAC as long as it seems practical. There is no evidence of infection. As far as I am aware he is offloading this aggressively 9/16; 2-week follow-up. Left greater trochanter. Dimensions about the same still about 4.9 cm of maximal undermining. Nevertheless the visible tissue that I can see looks healthy. We have a wound VAC and really from the perspective of how this looks initially this is gotten a lot better however not much change in the last week  9/30 2-week follow-up. Left greater trochanter. He has a tunnel at 12:00 that goes about 7 cm. From 10-12 lesser degrees of undermining. HOWEVER he comes in today telling us that he is going to have to go back to work. He will not be able to work with a wound VAC and he wants that discontinued. There was not much discussion. This is a financial issue for him. He also has erythema around the wound orifice which has satellite lesions around it this is no doubt cutaneous candidiasis 10/12; 2-week follow-up. Left greater trochanter. 7.2 cm of tunneling. 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 He has not been systemically unwell. 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 He is using silver alginate which he is  dressing himself, ABDs with a large plastic occlusive dressing. Dimensions are somewhat better although this is an irregular wound. When he was here the last time he had a lot of drainage coming out of this wound. The culture I did showed methicillin sensitive staph aureus. I gave him 10 days of doxycycline and he is just finishing up on this. He states the drainage is a lot better 04/03/2020 upon evaluation today patient appears to be doing well with regard to his wound as far as the overall appearance is concerned the one issue that he is noting is that he does have some green drainage occurring at times. Obviously this brings up the concern for Pseudomonas I will see anything visually right now that has me overly concerned about at the same time that history does concern me. The patient would like to see if we can attempt to treat this today. I think that being that green drainage is pretty much pathognomonic for a Pseudomonas infection I think he can continue to doxycycline but probably should do a couple weeks of Cipro to try to see if that clears up the drainage if not then we may need to delve into a more extensive culture possibly PCR at that point. With regard to the wound itself also believe based on talking with the patient today that he could be a candidate for a flap surgery that something he would like to consider READMISSION 07/04/2020 Ivan [name] is a 75 year old man we had in clinic until the end of November 2021. He had a substantial wound on the left greater trochanter that required an original IandD by Dr. Marla Roe early in 2021. This was an infected wound and had IV antibiotics directed by infectious disease. He also had an L1-L2 discitis. We may have him in clinic here we initially put him in a wound VAC and he actually did quite well with this with filling in of the underlying tissue however he had to go back to work. 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 He is still working there 40 hours a week second shift. He was coming to this clinic until late in November I am not exactly sure what happened. I had not referred him to plastic surgery because he would have to inevitably take time off work that he was not prepared to do. The orifice of the wound was too small to really reconsider a wound VAC and that still true. He arrives back in clinic with a direct depth of the wound at 1.5 cm  that tunnel goes to 3.1 cm from 3.6 last time. He saw Dr. Johnnye Sima on 04/14/2020. He ordered a follow-up MRI. This showed persistence of a peritrochanteric fluid collection in the bursa which was slightly larger than the previous image. This communicated with the wounds. The patient still says he is having clear fluid drainage and he is changing his dressing twice a day. He is currently using predominantly half-strength Dakin's wet-to-dry and he correctly notes that the wound is clean and has come in significantly albeit slowly. He has not noticed any systemic symptoms. No real pain. Last measurements of his C-reactive protein and sedimentation rate were 3.7 and 2 respectively. He was supposed to follow-up with Dr. Johnnye Sima I am not sure that he has. Dr. Johnnye Sima had tried to organize an appointment with Dr. Marla Roe I do not think that ever got done either. The patient tells me he is not willing to consider any further surgery to this area until he has short-term disability time which he thinks will happen sometime in May or June 3/22; monthly follow-up. Minimal change in the important measurements of depth at 1.7 and the 12:00 tunnel at 2.6. We have been referred back to Dr. Marla Roe I think by Dr. Johnnye Sima although he does not yet have a plastics appointment 10/24/2020; patient dropped off our schedule when he returns to clinic with for review of the same wound over the left greater trochanter. He says the reason is because of medical issues including TUR of a benign  bladder tumor. He also had cataract issues. In any case he has been using a hydrogel wet-to-dry to his left hip wound. He is working full-time this does not limit him. Direct depth today measured at 2.1 with undermining from 8-12 also with 2.1. This represents some improvement. He tells me he never did see Dr. Marla Roe again because he could not coordinate another appointment. He is not really wanting to do the flap surgery type of thing he says this does not inhibit his lifestyle 7/14; 1 month follow-up he is using hydrogel wet-to-dry. The wound has 2 cm of direct depth and a 2.5 cm tunnel at 1:00 I am not sure that this has changed too much. Once again I have talked to him about whether he wants to try a different dressing Hydrofera Blue packing rope, polymen Ag rope etc. He does not seem at all interested in this. Wants to do the hydrogel wet-to-dry packing for another month 8/25; 1 month follow-up using hydrogel wet-to-dry. I think he is actually done better. Wound is come in since the last time he was here a month ago. He reports no other issues Electronic Signature(s) Signed: 01/05/2021 7:36:59 AM By: [name] MD Entered By: [name] on 01/04/2021 09:42:25 -------------------------------------------------------------------------------- Physical Exam Details Patient Name: Date of Service: Ivan [name], Ivan BERT W. 01/04/2021 9:15 A M Medical Record Number: [id number] Patient Account Number: [id number] Date of Birth/Sex: Treating RN: 1945-08-14 (75 y.o. [name] Primary Care Provider: [name] Other Clinician: Referring Provider: Treating Provider/Extender: Fonnie Jarvis in Treatment: 10 Constitutional Patient is hypertensive.. Pulse regular and within target range for patient.Marland Kitchen Respirations regular, non-labored and within target range.. Temperature is normal and within the target range for the patient.Marland Kitchen Appears in no distress. Notes Wound  exam; right greater trochanter. I think there is less direct depth. There is still undermining but that may have come in as well. The orifice of the wound is too small to really look at the  wound surface. No evidence of surrounding infection Electronic Signature(s) Signed: 01/05/2021 7:36:59 AM By: [name] MD Entered By: [name] on 01/04/2021 09:46:14 -------------------------------------------------------------------------------- Physician Orders Details Patient Name: Date of Service: Ivan [name], Ivan BERT W. 01/04/2021 9:15 A M Medical Record Number: [id number] Patient Account Number: [id number] Date of Birth/Sex: Treating RN: Apr 13, 1946 (75 y.o. [name] Primary Care Provider: [name] Other Clinician: Referring Provider: Treating Provider/Extender: Fonnie Jarvis in Treatment: 10 Verbal / Phone Orders: No Diagnosis Coding ICD-10 Coding Code Description [phone] Pressure ulcer of left hip, stage 4 Follow-up Appointments Return appointment in 1 month. - Dr. Dellia Nims Bathing/ Shower/ Hygiene May shower with protection but do not get wound dressing(s) wet. Off-Loading Turn and reposition every 2 hours Wound Treatment Wound #3 - Trochanter Wound Laterality: Left Cleanser: Soap and Water 1 x Per Day/30 Days Discharge Instructions: May shower and wash wound with dial antibacterial soap and water prior to dressing change. Cleanser: Wound Cleanser (Generic) 1 x Per Day/30 Days Discharge Instructions: Cleanse the wound with wound cleanser prior to applying a clean dressing using gauze sponges, not tissue or cotton balls. Topical: Skintegrity Hydrogel 4 (oz) 1 x Per Day/30 Days Discharge Instructions: Apply hydrogel as directed Secondary Dressing: ABD Pad, 5x9 (Generic) 1 x Per Day/30 Days Discharge Instructions: Apply over primary dressing as directed. Secured With: Child psychotherapist, Sterile 2x75 (in/in) 1 x Per Day/30  Days Discharge Instructions: Cut and moisten with Hydrogel, pack into wound Secured With: 52M Medipore H Soft Cloth Surgical T 4 x 2 (in/yd) (Generic) 1 x Per Day/30 Days ape Discharge Instructions: Secure dressing with tape as directed. Electronic Signature(s) Signed: 01/04/2021 5:23:24 PM By: [name] Signed: 01/05/2021 7:36:59 AM By: [name] MD Entered By: [name] on 01/04/2021 09:41:28 -------------------------------------------------------------------------------- Problem List Details Patient Name: Date of Service: Ivan [name], Ivan BERT W. 01/04/2021 9:15 A M Medical Record Number: [id number] Patient Account Number: [id number] Date of Birth/Sex: Treating RN: 1946-04-18 (75 y.o. [name] Primary Care Provider: [name] Other Clinician: Referring Provider: Treating Provider/Extender: Ronnie Derby, [name] in Treatment: 10 Active Problems ICD-10 Encounter Code Description Active Date MDM Diagnosis L89.224 Pressure ulcer of left hip, stage 4 10/24/2020 No Yes Inactive Problems Resolved Problems Electronic Signature(s) Signed: 01/05/2021 7:36:59 AM By: [name] MD Previous Signature: 01/04/2021 9:24:20 AM Version By: [name] Entered By: [name] on 01/04/2021 09:41:22 -------------------------------------------------------------------------------- Progress Note Details Patient Name: Date of Service: Ivan [name], Ivan BERT W. 01/04/2021 9:15 A M Medical Record Number: [id number] Patient Account Number: [id number] Date of Birth/Sex: Treating RN: August 29, 1945 (75 y.o. 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 He required admission to hospital from  1/5 through 1/9 with Covid pneumonia. At that point he apparently left AMA. He was noted to have a left hip bruise at the time of discharge. I suspect this really was a deep tissue injury. He was readmitted from 1/13 through 1//27 with altered mental status Covid pneumonia and delirium. At that point he had a left hip and thigh wound which had worsened. Felt to be a pressure injury. He was seen by general surgery who recommended normal saline wet-to-dry twice daily he did not have a debridement. After he left at that point he was incapacitated  by increasing lumbar pain. An MRI on 2/9 showed discitis and osteomyelitis at L1/2. He asked aspiration of 1 cm fluid however I think this culture negative. He was also noted at this point to have a very large wound over the left greater trochanter. He was taken to the OR by Dr. Marla Roe for debridement with ACell placement. He was placed in a wound VAC. A culture at that point showed Enterococcus and Enterobacter. An MRI on 3/15 showed no evidence of left hip septic arthritis or osteomyelitis. Extensive subcutaneous edema within the visualized soft tissue of the left thigh and prominent left inguinal lymph nodes likely reactive. He was started on IV cefepime and vancomycin for 6 weeks. He was discharged to [facility] facility as the patient. There I gather the wound developed the necrotic surface. He was seen by the wound care doctor in house who correctly pointed out that a wound VAC would not do anything with a necrotic surface. Since then he has been using Santyl The patient is now at home. He was on vancomycin and cefepime. He developed hives and a generalized rash and was seen in the ER on 3/15 by Dr. Cain Sieve recommendation he was changed to meropenem however he also apparently developed hives after this. He says the original hives stopped after the cefepime was stopped and follow-up hives only developed after the meropenem. He is only taken 1 dose of the  fear of the meropenem and now is on vancomycin. 3/25; the patient was seen by Dr. Linus Salmons on 3/22. Because of intolerances to meropenem which I communicated with Dr. De Burrs about he is now on daptomycin and Cipro until sometime in early April. He seems to be tolerating the antibiotics well. We are using Santyl back by wet-to-dry dressings. And making some improvement in the general surface of this very large wound 4/8; we are using Santyl back by wet-to-dry. We are making progress in terms of prepping the wound for a wound VAC. Still necrotic material requiring debridement. 4/16; we are using Santyl back by wet-to-dry. Not convinced that his wife is packing this properly there is a 6 cm tunnel at 12:00. We brought this to attention. Still requiring mechanical debridement 4/29; 2-week follow-up. In general the wound is better however there is considerable tunneling tunneling to the greater trochanter itself. There is still some debris in the deeper parts of this wound I did not do mechanical debridement on this today. I am going to change the dressing to quarter strength Dakin's wet-to-dry. We will put a wound VAC through his insurance. His wife has had brain damage from a previous meningioma surgery. She is not comfortable around strangers in her home. The patient is concerned about this 5/18; patient is using quarter strength Dakin's wet to dry. The problem is a deep pressure area over the left greater trochanter. There was some question from her intake nurses that this is actually come in and some question raised about whether he would require a wound VAC or not. The issue with the wound VAC was complicated predominantly by the patient's reluctance to allow people into his home i.e. home health although he tells me he has this mostly "sorted out" 7/1; this is a patient that I have not seen in almost 6 weeks. Apparently canceled several appointments related to family problems deaths in the family etc.  He is wife has ophthalmologic surgery next week. I had been trying to get a wound VAC ordered for this deep probing wound over the  left greater trochanter but so far I think he is simply doing a Dakin's wet-to-dry and our intake nurse did not think there was any Dakin's on it. I have a note from Dr. Tommy Medal of infectious disease on 6/30. He has now been off doxycycline and ciprofloxacin for several weeks. He has no undue pain or drainage. He also had discitis and osteomyelitis at L1-L2 with a 1 cm left psoas abscess as well as some increased signal at T10/T11. He is not having any problems related to this. 12/16/2019 on evaluation today patient's wound actually is showing some signs of bit of improvement which is good news. Fortunately there is no signs of active infection at this time which is good news. He tells me that the nurses have been coming to change this 3 times a week and doing a great job. He told the intake nurse that they had only been coming out once a week. I am not really sure what is going on with the discrepancy and stories here. Nonetheless I do believe that he needs to have this changed 3 times a week on a regular basis he does seem to have a little bit of a fungal infection around the wound. 8/19; 2-week follow-up. I have not seen this patient in over 6 weeks. There is been a dramatic improvement in the overall wound volume. Circumference of the wound is now small although there is still considerable undermining from roughly 9-2 o'clock maximum 1:00 at roughly 4.7 cm. There is no purulent drainage. He has some irritation around the wound on the normal skin. They are using miconazole on this. 9/2; 2-week follow-up. Dimensions of the wound circumference look quite good. There is tunneling at 4.3 cm at 1:00 versus 4.7 last time. The area continues to look like it is contracting with the wound VAC and I think the treatment of choice here will be continuing to the wound VAC as long as it  seems practical. There is no evidence of infection. As far as I am aware he is offloading this aggressively 9/16; 2-week follow-up. Left greater trochanter. Dimensions about the same still about 4.9 cm of maximal undermining. Nevertheless the visible tissue that I can see looks healthy. We have a wound VAC and really from the perspective of how this looks initially this is gotten a lot better however not much change in the last week 9/30 2-week follow-up. Left greater trochanter. He has a tunnel at 12:00 that goes about 7 cm. From 10-12 lesser degrees of undermining. HOWEVER he comes in today telling us that he is going to have to go back to work. He will not be able to work with a wound VAC and he wants that discontinued. There was not much discussion. This is a financial issue for him. He also has erythema around the wound orifice which has satellite lesions around it this is no doubt cutaneous candidiasis 10/12; 2-week follow-up. Left greater trochanter. 7.2 cm of tunneling. 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 He has not been systemically unwell. 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 He is using silver alginate which he is dressing himself, ABDs with a large plastic occlusive dressing. Dimensions are somewhat better although this is an irregular wound. When he was here the last time he had a lot of drainage coming  out of this wound. The culture I did showed methicillin sensitive staph aureus. I gave him 10 days of doxycycline and he is just finishing up on this. He states the drainage is a lot better 04/03/2020 upon evaluation today patient appears to be doing well with regard to his wound as far as the overall appearance is concerned the one issue that he is noting is that he does have some green drainage occurring at times. Obviously this  brings up the concern for Pseudomonas I will see anything visually right now that has me overly concerned about at the same time that history does concern me. The patient would like to see if we can attempt to treat this today. I think that being that green drainage is pretty much pathognomonic for a Pseudomonas infection I think he can continue to doxycycline but probably should do a couple weeks of Cipro to try to see if that clears up the drainage if not then we may need to delve into a more extensive culture possibly PCR at that point. With regard to the wound itself also believe based on talking with the patient today that he could be a candidate for a flap surgery that something he would like to consider READMISSION 07/04/2020 Mr. Ivan [name] is a 75 year old man we had in clinic until the end of November 2021. He had a substantial wound on the left greater trochanter that required an original IandD by Dr. Marla Roe early in 2021. This was an infected wound and had IV antibiotics directed by infectious disease. He also had an L1-L2 discitis. We may have him in clinic here we initially put him in a wound VAC and he actually did quite well with this with filling in of the underlying tissue however he had to go back to work. 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 He is still working there 40 hours a week second shift. He was coming to this clinic until late in November I am not exactly sure what happened. I had not referred him to plastic surgery because he would have to inevitably take time off work that he was not prepared to do. The orifice of the wound was too small to really reconsider a wound VAC and that still true. He arrives back in clinic with a direct depth of the wound at 1.5 cm that tunnel goes to 3.1 cm from 3.6 last time. He saw Dr. Johnnye Sima on 04/14/2020. He ordered a follow-up MRI. This showed persistence of a peritrochanteric fluid collection in the bursa  which was slightly larger than the previous image. This communicated with the wounds. The patient still says he is having clear fluid drainage and he is changing his dressing twice a day. He is currently using predominantly half-strength Dakin's wet-to-dry and he correctly notes that the wound is clean and has come in significantly albeit slowly. He has not noticed any systemic symptoms. No real pain. Last measurements of his C-reactive protein and sedimentation rate were 3.7 and 2 respectively. He was supposed to follow-up with Dr. Johnnye Sima I am not sure that he has. Dr. Johnnye Sima had tried to organize an appointment with Dr. Marla Roe I do not think that ever got done either. The patient tells me he is not willing to consider any further surgery to this area until he has short-term disability time which he thinks will happen sometime in May or June 3/22; monthly follow-up. Minimal change in the important measurements of depth  at 1.7 and the 12:00 tunnel at 2.6. We have been referred back to Dr. Marla Roe I think by Dr. Johnnye Sima although he does not yet have a plastics appointment 10/24/2020; patient dropped off our schedule when he returns to clinic with for review of the same wound over the left greater trochanter. He says the reason is because of medical issues including TUR of a benign bladder tumor. He also had cataract issues. In any case he has been using a hydrogel wet-to-dry to his left hip wound. He is working full-time this does not limit him. Direct depth today measured at 2.1 with undermining from 8-12 also with 2.1. This represents some improvement. He tells me he never did see Dr. Marla Roe again because he could not coordinate another appointment. He is not really wanting to do the flap surgery type of thing he says this does not inhibit his lifestyle 7/14; 1 month follow-up he is using hydrogel wet-to-dry. The wound has 2 cm of direct depth and a 2.5 cm tunnel at 1:00 I am not sure  that this has changed too much. Once again I have talked to him about whether he wants to try a different dressing Hydrofera Blue packing rope, polymen Ag rope etc. He does not seem at all interested in this. Wants to do the hydrogel wet-to-dry packing for another month 8/25; 1 month follow-up using hydrogel wet-to-dry. I think he is actually done better. Wound is come in since the last time he was here a month ago. He reports no other issues Objective Constitutional Patient is hypertensive.. Pulse regular and within target range for patient.Marland Kitchen Respirations regular, non-labored and within target range.. Temperature is normal and within the target range for the patient.Marland Kitchen Appears in no distress. Vitals Time Taken: 9:26 AM, Height: 70 in, Weight: 187 lbs, BMI: 26.8, Temperature: 98.6 F, Pulse: 73 bpm, Respiratory Rate: 18 breaths/min, Blood Pressure: 157/78 mmHg. General Notes: Wound exam; right greater trochanter. I think there is less direct depth. There is still undermining but that may have come in as well. The orifice of the wound is too small to really look at the wound surface. No evidence of surrounding infection Integumentary (Hair, Skin) Wound #3 status is Open. Original cause of wound was Pressure Injury. The date acquired was: 05/18/2019. The wound has been in treatment 10 weeks. The wound is located on the Left Trochanter. The wound measures 1cm length x 0.4cm width x 1.7cm depth; 0.314cm^2 area and 0.534cm^3 volume. There is Fat Layer (Subcutaneous Tissue) exposed. Tunneling has been noted at 10:00 with a maximum distance of 2cm. Undermining begins at 8:00 and ends at 10:00 with a maximum distance of 1.5cm. There is a medium amount of serosanguineous drainage noted. The wound margin is well defined and not attached to the wound base. There is large (67-100%) red granulation within the wound bed. There is no necrotic tissue within the wound bed. General Notes: Periwound  irritation Assessment Active Problems ICD-10 Pressure ulcer of left hip, stage 4 Plan Follow-up Appointments: Return appointment in 1 month. - Dr. Dellia Nims Bathing/ Shower/ Hygiene: May shower with protection but do not get wound dressing(s) wet. Off-Loading: Turn and reposition every 2 hours WOUND #3: - Trochanter Wound Laterality: Left Cleanser: Soap and Water 1 x Per Day/30 Days Discharge Instructions: May shower and wash wound with dial antibacterial soap and water prior to dressing change. Cleanser: Wound Cleanser (Generic) 1 x Per Day/30 Days Discharge Instructions: Cleanse the wound with wound cleanser prior to applying a  clean dressing using gauze sponges, not tissue or cotton balls. Topical: Skintegrity Hydrogel 4 (oz) 1 x Per Day/30 Days Discharge Instructions: Apply hydrogel as directed Secondary Dressing: ABD Pad, 5x9 (Generic) 1 x Per Day/30 Days Discharge Instructions: Apply over primary dressing as directed. Secured With: Child psychotherapist, Sterile 2x75 (in/in) 1 x Per Day/30 Days Discharge Instructions: Cut and moisten with Hydrogel, pack into wound Secured With: 33M Medipore H Soft Cloth Surgical T 4 x 2 (in/yd) (Generic) 1 x Per Day/30 Days ape Discharge Instructions: Secure dressing with tape as directed. 1. The wound appears to be coming in a bit. Orifice too small to really see the wound bed in any detail. 2. This was originally a large stage IV wound this is contracted immensely . 3. Patient does not wish to consider any alternative dressing at this point using hydrogel wet-to-dry 4. Follow-up in 1 month Electronic Signature(s) Signed: 01/05/2021 7:36:59 AM By: [name] MD Entered By: [name] on 01/04/2021 09:48:07 -------------------------------------------------------------------------------- SuperBill Details Patient Name: Date of Service: Ivan [name], Ivan BERT W. 01/04/2021 Medical Record Number: [id number] Patient Account Number:  [id number] Date of Birth/Sex: Treating RN: 1945/06/11 (75 y.o. [name] Primary Care Provider: [name] Other Clinician: Referring Provider: Treating Provider/Extender: Ronnie Derby, [name] in Treatment: 10 Diagnosis Coding ICD-10 Codes Code Description [phone] Pressure ulcer of left hip, stage 4 Facility Procedures CPT4 Code: [id number] Description: [id number] - WOUND CARE VISIT-LEV 3 EST PT Modifier: Quantity: 1 Physician Procedures : CPT4 Code Description Modifier [id number] - WC PHYS LEVEL 3 - EST PT ICD-10 Diagnosis Description L89.224 Pressure ulcer of left hip, stage 4 Quantity: 1 Electronic Signature(s) Signed: 01/05/2021 7:36:59 AM By: [name] MD Entered By: [name] on 01/04/2021 09:48:21

## 2021-01-21 ENCOUNTER — Encounter: Payer: Self-pay | Admitting: Family Medicine

## 2021-01-22 ENCOUNTER — Encounter: Payer: Self-pay | Admitting: Family Medicine

## 2021-01-22 ENCOUNTER — Ambulatory Visit (INDEPENDENT_AMBULATORY_CARE_PROVIDER_SITE_OTHER): Payer: Medicare HMO | Admitting: Family Medicine

## 2021-01-22 ENCOUNTER — Other Ambulatory Visit: Payer: Self-pay

## 2021-01-22 ENCOUNTER — Ambulatory Visit: Payer: Medicare HMO | Admitting: Family Medicine

## 2021-01-22 VITALS — BP 113/60 | HR 64 | Ht 70.5 in | Wt 181.0 lb

## 2021-01-22 DIAGNOSIS — R7303 Prediabetes: Secondary | ICD-10-CM | POA: Diagnosis not present

## 2021-01-22 DIAGNOSIS — I10 Essential (primary) hypertension: Secondary | ICD-10-CM

## 2021-01-22 DIAGNOSIS — Z0000 Encounter for general adult medical examination without abnormal findings: Secondary | ICD-10-CM | POA: Diagnosis not present

## 2021-01-22 DIAGNOSIS — Z23 Encounter for immunization: Secondary | ICD-10-CM | POA: Diagnosis not present

## 2021-01-22 DIAGNOSIS — I2510 Atherosclerotic heart disease of native coronary artery without angina pectoris: Secondary | ICD-10-CM | POA: Diagnosis not present

## 2021-01-22 DIAGNOSIS — Z1211 Encounter for screening for malignant neoplasm of colon: Secondary | ICD-10-CM

## 2021-01-22 DIAGNOSIS — E291 Testicular hypofunction: Secondary | ICD-10-CM

## 2021-01-22 LAB — POCT GLYCOSYLATED HEMOGLOBIN (HGB A1C): Hemoglobin A1C: 5.4 % (ref 4.0–5.6)

## 2021-01-22 MED ORDER — SHINGRIX 50 MCG/0.5ML IM SUSR: 0.5000 mL | INTRAMUSCULAR | 0 refills | Status: AC

## 2021-01-22 MED ORDER — TETANUS-DIPHTH-ACELL PERTUSSIS 5-2.5-18.5 LF-MCG/0.5 IM SUSY: 0.5000 mL | PREFILLED_SYRINGE | Freq: Once | INTRAMUSCULAR | 0 refills | Status: AC

## 2021-01-22 NOTE — Assessment & Plan Note (Signed)
At goal continue current therapy.

## 2021-01-22 NOTE — Progress Notes (Signed)
SUBJECTIVE:   Chief compliant/HPI: annual examination  Ivan [name] is a 75 y.o. who presents today for an annual exam.   History tabs reviewed and updated his history is notable for coronary artery disease, stroke, recent left hip osteomyelitis with a nonhealing wound, hypertension and hypogonadism on testosterone therapy  The patient reports no concerns today.  He reports unintentional weight loss due to the keto diet.  He reports he is very adherent to this and since his hospitalization he has made strategic efforts to lose weight.  He denies fevers, chills, night sweats, chest pain, dyspnea, lower extremity edema.  He denies hematuria.  He denies melena or hematochezia.  He follows with Dr. Claudia Desanctis for his testosterone.  He has never had a colonoscopy but is interested in this.  The patient's weight has routinely fluctuated between 180 pounds and 210 pounds.  At his highest he was in the 220s this was during his hospitalization after considerable IV fluid administration.  He reports his goal weight is 170 pounds.  He would like to be in a normal BMI range for his health.  He feels overall well and feels that his change in diet and increasing activity since his hip is feeling has contributed to his weight loss.  The patient had a recent fall after using Xanax in combination with alcohol.  His alcohol level was in the 200s on admission.  He reports he is considerably decreased his alcohol use and is only drinking at most 1 light beer a night.  He no longer has Xanax available to him.  Review of systems form reviewed and notable for reviewed and updated.   Current specialists include cardiology, urology for testosterone therapy, infectious disease and pain management.  His pain management specialist prescribes Adderall and tramadol.  OBJECTIVE:   BP 113/60   Pulse 64   Ht 5' 10.5" (1.791 m)   Wt 181 lb (82.1 kg)   SpO2 97%   BMI 25.60 kg/m   Dentition is moderate.  Neck supple with  no adenopathy. Cardiac: Regular rate and rhythm. Normal S1/S2. No murmurs, rubs, or gallops appreciated. Lungs: Clear bilaterally to ascultation.  Abdomen: Normoactive bowel sounds. No tenderness to deep or light palpation. No rebound or guarding.   Psych: Pleasant and appropriate  Lower extremities are with 1+ edema on right and 2+ on left, this is consistent with prior   ASSESSMENT/PLAN:   Hypogonadism in male Follows with urology for testosterone injections.  Recommend he have a PSA checked given his weight loss.  He reports urology checks this regularly.  Ask him that he have records faxed.  Essential hypertension At goal continue current therapy.  CAD (coronary artery disease) Repeat cholesterol panel today.   Need for tetanus and shingles vaccine prescriptions were given to the patient to obtain these.  Prediabetes repeat A1c within normal limits no indication for repeat for 4 to 6 years  intentional weight loss the patient reports this is entirely intentional.  We discussed the risks of undetected malignancy.  He has never had a colonoscopy referral was placed.  He regularly follows with Dr. Claudia Desanctis for testosterone.  Recommended she take check a PSA which she regularly does he thinks.  We will review prior imaging again which showed only hepatic steatosis.  He had negative hepatitis serologies.  Recommended close follow-up.  Will monitor for any symptoms of malignancy.  Ensure he is up-to-date on routine age-appropriate cancer screening.  Annual Examination  See AVS for age  appropriate recommendations.  Blood pressure value is at goal, discussed.   Considered the following screening exams based upon USPSTF recommendations: HIV testing: neg Hepatitis C:  neg Hepatitis B:  neg Syphilis if at high risk:  NA Reviewed risk factors for latent tuberculosis and not indicated Colorectal cancer screening: discussed, colonoscopy ordered Lung cancer screening:  not indicated3  See  documentation below regarding discussion and indication.  PSA discussed and after engaging in discussion of possible risks, benefits and complications of screening patient elected to follows with urology .   Follow up in 1 year or sooner if indicated.    Martyn Malay, MD [facility]

## 2021-01-22 NOTE — Patient Instructions (Signed)
It was wonderful to see you today.  Please bring ALL of your medications with you to every visit.   Today we talked about:   Follow up in 3 months about your weight---  Please ask Dr.Pace to send your PSA  I will call you with cholesterol   Go to the pharmacy for your shingles and tetanus  Don't forget to get your COVID bivalent booster!    Thank you for choosing [facility].   Please call [phone] with any questions about today's appointment.  Please be sure to schedule follow up at the front  desk before you leave today.   [name], MD  Family Medicine

## 2021-01-22 NOTE — Assessment & Plan Note (Signed)
Follows with urology for testosterone injections.  Recommend he have a PSA checked given his weight loss.  He reports urology checks this regularly.  Ask him that he have records faxed.

## 2021-01-22 NOTE — Assessment & Plan Note (Signed)
Repeat cholesterol panel today.

## 2021-01-23 ENCOUNTER — Telehealth: Payer: Self-pay | Admitting: Family Medicine

## 2021-01-23 LAB — LIPID PANEL
Chol/HDL Ratio: 2.2 ratio (ref 0.0–5.0)
Cholesterol, Total: 129 mg/dL (ref 100–199)
HDL: 60 mg/dL (ref >39)
LDL Chol Calc (NIH): 58 mg/dL (ref 0–99)
Triglycerides: 47 mg/dL (ref 0–149)
VLDL Cholesterol Cal: 11 mg/dL (ref 5–40)

## 2021-01-23 NOTE — Telephone Encounter (Signed)
Call patient with updated lipid panel.  Congratulated on changes.  We will monitor his weight changes as he has made significant dietary changes over the last year.  Will route to his cardiologist as well.

## 2021-02-01 ENCOUNTER — Encounter (HOSPITAL_BASED_OUTPATIENT_CLINIC_OR_DEPARTMENT_OTHER): Payer: Medicare HMO | Attending: Internal Medicine | Admitting: Internal Medicine

## 2021-02-01 ENCOUNTER — Other Ambulatory Visit: Payer: Self-pay

## 2021-02-01 DIAGNOSIS — L89224 Pressure ulcer of left hip, stage 4: Secondary | ICD-10-CM | POA: Insufficient documentation

## 2021-02-01 DIAGNOSIS — Z8616 Personal history of COVID-19: Secondary | ICD-10-CM | POA: Diagnosis not present

## 2021-02-01 NOTE — Progress Notes (Signed)
Ivan, [name] ([id number]) Visit Report for 02/01/2021 Arrival Information Details Patient Name: Date of Service: Ivan [name]. 02/01/2021 9:30 A M Medical Record Number: [id number] Patient Account Number: [id number] Date of Birth/Sex: Treating RN: 24-May-1945 (75 y.o. Ivan [name] Primary Care Ryan Palermo: Ivan [name] Other Clinician: Referring Colon Rueth: Treating Ivan [name]/Extender: Fonnie Jarvis in Treatment: 14 Visit Information History Since Last Visit Added or deleted any medications: No Patient Arrived: Ambulatory Any new allergies or adverse reactions: No Arrival Time: 09:42 Had a fall or experienced change in No Accompanied By: alone activities of daily living that may affect Transfer Assistance: None risk of falls: Patient Identification Verified: Yes Signs or symptoms of abuse/neglect since last visito No Secondary Verification Process Completed: Yes Hospitalized since last visit: No Patient Requires Transmission-Based Precautions: No Implantable device outside of the clinic excluding No Patient Has Alerts: No cellular tissue based products placed in the center since last visit: Has Dressing in Place as Prescribed: Yes Pain Present Now: No Electronic Signature(s) Signed: 02/01/2021 5:17:03 PM By: Levan Hurst RN, BSN Entered By: Levan Hurst on 02/01/2021 09:43:16 -------------------------------------------------------------------------------- Clinic Level of Care Assessment Details Patient Name: Date of Service: Ivan [name], Ivan BERT W. 02/01/2021 9:30 A M Medical Record Number: [id number] Patient Account Number: [id number] Date of Birth/Sex: Treating RN: April 10, 1946 (75 y.o. Ivan [name] Primary Care Kamden Stanislaw: Ivan [name] Other Clinician: Referring Janette Harvie: Treating Laquonda Welby/Extender: Fonnie Jarvis in Treatment: 14 Clinic Level of Care Assessment Items TOOL 4 Quantity Score X- 1 0 Use when only an  EandM is performed on FOLLOW-UP visit ASSESSMENTS - Nursing Assessment / Reassessment X- 1 10 Reassessment of Co-morbidities (includes updates in patient status) X- 1 5 Reassessment of Adherence to Treatment Plan ASSESSMENTS - Wound and Skin A ssessment / Reassessment X - Simple Wound Assessment / Reassessment - one wound 1 5 []  - 0 Complex Wound Assessment / Reassessment - multiple wounds []  - 0 Dermatologic / Skin Assessment (not related to wound area) ASSESSMENTS - Focused Assessment []  - 0 Circumferential Edema Measurements - multi extremities []  - 0 Nutritional Assessment / Counseling / Intervention []  - 0 Lower Extremity Assessment (monofilament, tuning fork, pulses) []  - 0 Peripheral Arterial Disease Assessment (using hand held doppler) ASSESSMENTS - Ostomy and/or Continence Assessment and Care []  - 0 Incontinence Assessment and Management []  - 0 Ostomy Care Assessment and Management (repouching, etc.) PROCESS - Coordination of Care X - Simple Patient / Family Education for ongoing care 1 15 []  - 0 Complex (extensive) Patient / Family Education for ongoing care X- 1 10 Staff obtains Consents, Records, T Results / Process Orders est []  - 0 Staff telephones HHA, Nursing Homes / Clarify orders / etc []  - 0 Routine Transfer to another Facility (non-emergent condition) []  - 0 Routine Hospital Admission (non-emergent condition) []  - 0 New Admissions / Biomedical engineer / Ordering NPWT Apligraf, etc. , []  - 0 Emergency Hospital Admission (emergent condition) X- 1 10 Simple Discharge Coordination []  - 0 Complex (extensive) Discharge Coordination PROCESS - Special Needs []  - 0 Pediatric / Minor Patient Management []  - 0 Isolation Patient Management []  - 0 Hearing / Language / Visual special needs []  - 0 Assessment of Community assistance (transportation, D/C planning, etc.) []  - 0 Additional assistance / Altered mentation []  - 0 Support Surface(s)  Assessment (bed, cushion, seat, etc.) INTERVENTIONS - Wound Cleansing / Measurement X - Simple Wound Cleansing - one wound 1 5 []  - 0 Complex Wound  Cleansing - multiple wounds X- 1 5 Wound Imaging (photographs - any number of wounds) []  - 0 Wound Tracing (instead of photographs) X- 1 5 Simple Wound Measurement - one wound []  - 0 Complex Wound Measurement - multiple wounds INTERVENTIONS - Wound Dressings X - Small Wound Dressing one or multiple wounds 1 10 []  - 0 Medium Wound Dressing one or multiple wounds []  - 0 Large Wound Dressing one or multiple wounds []  - 0 Application of Medications - topical []  - 0 Application of Medications - injection INTERVENTIONS - Miscellaneous []  - 0 External ear exam []  - 0 Specimen Collection (cultures, biopsies, blood, body fluids, etc.) []  - 0 Specimen(s) / Culture(s) sent or taken to Lab for analysis []  - 0 Patient Transfer (multiple staff / Civil Service fast streamer / Similar devices) []  - 0 Simple Staple / Suture removal (25 or less) []  - 0 Complex Staple / Suture removal (26 or more) []  - 0 Hypo / Hyperglycemic Management (close monitor of Blood Glucose) []  - 0 Ankle / Brachial Index (ABI) - do not check if billed separately X- 1 5 Vital Signs Has the patient been seen at the hospital within the last three years: Yes Total Score: 85 Level Of Care: New/Established - Level 3 Electronic Signature(s) Signed: 02/01/2021 5:17:03 PM By: Levan Hurst RN, BSN Entered By: Levan Hurst on 02/01/2021 09:57:42 -------------------------------------------------------------------------------- Encounter Discharge Information Details Patient Name: Date of Service: Ivan [name], Ivan BERT W. 02/01/2021 9:30 A M Medical Record Number: [id number] Patient Account Number: [id number] Date of Birth/Sex: Treating RN: 1945/12/17 (75 y.o. Ivan [name] Primary Care Ivan [name]: Ivan [name] Other Clinician: Referring Ivan [name]: Treating Levi Crass/Extender: Fonnie Jarvis in Treatment: 14 Encounter Discharge Information Items Discharge Condition: Stable Ambulatory Status: Ambulatory Discharge Destination: Home Transportation: Private Auto Accompanied By: alone Schedule Follow-up Appointment: Yes Clinical Summary of Care: Patient Declined Electronic Signature(s) Signed: 02/01/2021 5:17:03 PM By: Levan Hurst RN, BSN Entered By: Levan Hurst on 02/01/2021 10:06:16 -------------------------------------------------------------------------------- Multi Wound Chart Details Patient Name: Date of Service: Ivan [name], Ivan BERT W. 02/01/2021 9:30 A M Medical Record Number: [id number] Patient Account Number: [id number] Date of Birth/Sex: Treating RN: 02/13/1946 (75 y.o. [name] Primary Care Abiha Lukehart: Ivan [name] Other Clinician: Referring Brandey Vandalen: Treating Khaliel Morey/Extender: Ronnie Derby, [name] in Treatment: 14 Vital Signs Height(in): 70 Pulse(bpm): 73 Weight(lbs): 187 Blood Pressure(mmHg): 134/65 Body Mass Index(BMI): 27 Temperature(F): 98.3 Respiratory Rate(breaths/min): 16 Photos: [3:Left Trochanter] [N/A:N/A N/A] Wound Location: [3:Pressure Injury] [N/A:N/A] Wounding Event: [3:Pressure Ulcer] [N/A:N/A] Primary Etiology: [3:Cataracts, Coronary Artery Disease, N/A] Comorbid History: [3:Hypertension, Myocardial Infarction, Osteoarthritis, Osteomyelitis 05/18/2019] [N/A:N/A] Date Acquired: [3:14] [N/A:N/A] Weeks of Treatment: [3:Open] [N/A:N/A] Wound Status: [3:1x0.5x1.6] [N/A:N/A] Measurements L x W x D (cm) [3:0.393] [N/A:N/A] A (cm) : rea [3:0.628] [N/A:N/A] Volume (cm) : [3:45.00%] [N/A:N/A] % Reduction in A rea: [3:58.20%] [N/A:N/A] % Reduction in Volume: [3:7] Starting Position 1 (o'clock): [3:12] Ending Position 1 (o'clock): [3:2.5] Maximum Distance 1 (cm): [3:Yes] [N/A:N/A] Undermining: [3:Category/Stage IV] [N/A:N/A] Classification: [3:Medium] [N/A:N/A] Exudate A mount:  [3:Serosanguineous] [N/A:N/A] Exudate Type: [3:red, brown] [N/A:N/A] Exudate Color: [3:Well defined, not attached] [N/A:N/A] Wound Margin: [3:Large (67-100%)] [N/A:N/A] Granulation A mount: [3:Red] [N/A:N/A] Granulation Quality: [3:None Present (0%)] [N/A:N/A] Necrotic A mount: [3:Fat Layer (Subcutaneous Tissue): Yes N/A] Exposed Structures: [3:Fascia: No Tendon: No Muscle: No Joint: No Bone: No Medium (34-66%)] [N/A:N/A] Treatment Notes Wound #3 (Trochanter) Wound Laterality: Left Cleanser Soap and Water Discharge Instruction: May shower and wash wound with dial antibacterial soap and water prior to  dressing change. Wound Cleanser Discharge Instruction: Cleanse the wound with wound cleanser prior to applying a clean dressing using gauze sponges, not tissue or cotton balls. Peri-Wound Care Topical Skintegrity Hydrogel 4 (oz) Discharge Instruction: Apply hydrogel as directed Primary Dressing Secondary Dressing ABD Pad, 5x9 Discharge Instruction: Apply over primary dressing as directed. Secured With Child psychotherapist, Sterile 2x75 (in/in) Discharge Instruction: Cut and moisten with Hydrogel, pack into wound 18M Medipore H Soft Cloth Surgical T 4 x 2 (in/yd) ape Discharge Instruction: Secure dressing with tape as directed. Compression Wrap Compression Stockings Add-Ons Electronic Signature(s) Signed: 02/01/2021 4:58:28 PM By: [name] MD Signed: 02/01/2021 5:22:33 PM By: [name] RN, BSN Entered By: [name] on 02/01/2021 10:31:26 -------------------------------------------------------------------------------- Multi-Disciplinary Care Plan Details Patient Name: Date of Service: Ivan [name], Ivan BERT W. 02/01/2021 9:30 A M Medical Record Number: [id number] Patient Account Number: [id number] Date of Birth/Sex: Treating RN: 1946-04-03 (75 y.o. Ivan [name] Primary Care Mavis Gravelle: Ivan [name] Other Clinician: Referring Navarro Nine: Treating  Kabeer Hoagland/Extender: Ronnie Derby, [name] in Treatment: 14 Active Inactive Wound/Skin Impairment Nursing Diagnoses: Impaired tissue integrity Knowledge deficit related to ulceration/compromised skin integrity Goals: Patient/caregiver will verbalize understanding of skin care regimen Date Initiated: 10/24/2020 Target Resolution Date: 03/02/2021 Goal Status: Active Ulcer/skin breakdown will have a volume reduction of 30% by week 4 Date Initiated: 10/24/2020 Date Inactivated: 11/23/2020 Target Resolution Date: 11/16/2020 Unmet Reason: see wound Goal Status: Unmet measurements. Interventions: Assess patient/caregiver ability to obtain necessary supplies Assess patient/caregiver ability to perform ulcer/skin care regimen upon admission and as needed Assess ulceration(s) every visit Notes: Electronic Signature(s) Signed: 02/01/2021 5:17:03 PM By: Levan Hurst RN, BSN Entered By: Levan Hurst on 02/01/2021 09:55:42 -------------------------------------------------------------------------------- Pain Assessment Details Patient Name: Date of Service: Ivan [name], Ivan BERT W. 02/01/2021 9:30 A M Medical Record Number: [id number] Patient Account Number: [id number] Date of Birth/Sex: Treating RN: 1945/09/30 (74 y.o. Ivan [name] Primary Care Bretta Fees: Ivan [name] Other Clinician: Referring Shakeela Rabadan: Treating Gale Hulse/Extender: Fonnie Jarvis in Treatment: 14 Active Problems Location of Pain Severity and Description of Pain Patient Has Paino No Site Locations Pain Management and Medication Current Pain Management: Electronic Signature(s) Signed: 02/01/2021 5:17:03 PM By: Levan Hurst RN, BSN Entered By: Levan Hurst on 02/01/2021 09:46:12 -------------------------------------------------------------------------------- Patient/Caregiver Education Details Patient Name: Date of Service: [place] M, Ivan BERT W. 9/22/2022andnbsp9:30 A M Medical  Record Number: [id number] Patient Account Number: [id number] Date of Birth/Gender: Treating RN: Jul 01, 1945 (74 y.o. Ivan [name] Primary Care Physician: Ivan [name] Other Clinician: Referring Physician: Treating Physician/Extender: Fonnie Jarvis in Treatment: 14 Education Assessment Education Provided To: Patient Education Topics Provided Wound/Skin Impairment: Methods: Explain/Verbal Responses: State content correctly [employer]) Signed: 02/01/2021 5:17:03 PM By: Levan Hurst RN, BSN Entered By: Levan Hurst on 02/01/2021 09:56:59 -------------------------------------------------------------------------------- Wound Assessment Details Patient Name: Date of Service: Ivan [name], Ivan BERT W. 02/01/2021 9:30 A M Medical Record Number: [id number] Patient Account Number: [id number] Date of Birth/Sex: Treating RN: 09-18-1945 (75 y.o. [name] Primary Care Murriel Holwerda: Ivan [name] Other Clinician: Referring Emilee Market: Treating Luellen Howson/Extender: Ronnie Derby, [name] in Treatment: 14 Wound Status Wound Number: 3 Primary Pressure Ulcer Etiology: Wound Location: Left Trochanter Wound Open Wounding Event: Pressure Injury Status: Date Acquired: 05/18/2019 Comorbid Cataracts, Coronary Artery Disease, Hypertension, Myocardial Weeks Of Treatment: 14 History: Infarction, Osteoarthritis, Osteomyelitis Clustered Wound: No Photos Wound Measurements Length: (cm) 1 Width: (cm) 0.5 Depth: (cm) 1.6 Area: (cm) 0.393 Volume: (cm) 0.628 % Reduction in Area: 45% %  Reduction in Volume: 58.2% Epithelialization: Medium (34-66%) Tunneling: No Undermining: Yes Starting Position (o'clock): 7 Ending Position (o'clock): 12 Maximum Distance: (cm) 2.5 Wound Description Classification: Category/Stage IV Wound Margin: Well defined, not attached Exudate Amount: Medium Exudate Type: Serosanguineous Exudate Color: red, brown Foul  Odor After Cleansing: No Slough/Fibrino No Wound Bed Granulation Amount: Large (67-100%) Exposed Structure Granulation Quality: Red Fascia Exposed: No Necrotic Amount: None Present (0%) Fat Layer (Subcutaneous Tissue) Exposed: Yes Tendon Exposed: No Muscle Exposed: No Joint Exposed: No Bone Exposed: No Treatment Notes Wound #3 (Trochanter) Wound Laterality: Left Cleanser Soap and Water Discharge Instruction: May shower and wash wound with dial antibacterial soap and water prior to dressing change. Wound Cleanser Discharge Instruction: Cleanse the wound with wound cleanser prior to applying a clean dressing using gauze sponges, not tissue or cotton balls. Peri-Wound Care Topical Skintegrity Hydrogel 4 (oz) Discharge Instruction: Apply hydrogel as directed Primary Dressing Secondary Dressing ABD Pad, 5x9 Discharge Instruction: Apply over primary dressing as directed. Secured With Child psychotherapist, Sterile 2x75 (in/in) Discharge Instruction: Cut and moisten with Hydrogel, pack into wound 65M Medipore H Soft Cloth Surgical T 4 x 2 (in/yd) ape Discharge Instruction: Secure dressing with tape as directed. Compression Wrap Compression Stockings Add-Ons Electronic Signature(s) Signed: 02/01/2021 5:22:33 PM By: [name] RN, BSN Entered By: [name] on 02/01/2021 09:52:52 -------------------------------------------------------------------------------- Vitals Details Patient Name: Date of Service: Ivan [name], Ivan BERT W. 02/01/2021 9:30 A M Medical Record Number: [id number] Patient Account Number: [id number] Date of Birth/Sex: Treating RN: 05/21/1945 (76 y.o. Ivan [name] Primary Care Dale Ribeiro: Ivan [name] Other Clinician: Referring Keria Widrig: Treating Jianni Batten/Extender: Fonnie Jarvis in Treatment: 14 Vital Signs Time Taken: 09:42 Temperature (F): 98.3 Height (in): 70 Pulse (bpm): 72 Weight (lbs): 187 Respiratory Rate  (breaths/min): 16 Body Mass Index (BMI): 26.8 Blood Pressure (mmHg): 134/65 Reference Range: 80 - 120 mg / dl Electronic Signature(s) Signed: 02/01/2021 5:17:03 PM By: Levan Hurst RN, BSN Entered By: Levan Hurst on 02/01/2021 09:46:07

## 2021-02-01 NOTE — Progress Notes (Signed)
**Note Ivan-Identified via Obfuscation** SHOLOM, DULUDE ([id number]) Visit Report for 02/01/2021 HPI Details Patient Name: Date of Service: Ivan [name]. 02/01/2021 9:30 A M Medical Record Number: [id number] Patient Account Number: [id number] Date of Birth/Sex: Treating RN: 1946/04/17 (75 y.o. 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 He required admission to hospital from 1/5 through 1/9 with Covid pneumonia. At that point he apparently left AMA. He was noted to have a left hip bruise at the time of discharge. I suspect this really was a deep tissue injury. He was readmitted from 1/13 through 1//27 with altered mental status Covid pneumonia and delirium. At that point he had a left hip and thigh wound which had worsened. Felt to be a pressure injury. He was seen by general surgery who recommended normal saline wet-to-dry twice daily he did not have a debridement. After he left at that point he was incapacitated by increasing lumbar pain. An MRI on 2/9 showed discitis and osteomyelitis at L1/2. He asked aspiration of 1 cm fluid however I think this culture negative. He was also noted at this point to have a very large wound over the left greater trochanter. He was taken to the OR by Dr. Marla [name] for debridement with ACell placement. He was placed in a wound VAC. A culture at that point showed Enterococcus and Enterobacter. An MRI on 3/15 showed no evidence of left hip septic arthritis or osteomyelitis. Extensive subcutaneous edema within the visualized soft tissue of the left thigh and prominent left inguinal lymph nodes likely reactive. He was started on IV cefepime and vancomycin for 6 weeks. He was discharged to [facility] facility as the patient.  There I gather the wound developed the necrotic surface. He was seen by the wound care doctor in house who correctly pointed out that a wound VAC would not do anything with a necrotic surface. Since then he has been using Santyl The patient is now at home. He was on vancomycin and cefepime. He developed hives and a generalized rash and was seen in the ER on 3/15 by Dr. Cain [name] recommendation he was changed to meropenem however he also apparently developed hives after this. He says the original hives stopped after the cefepime was stopped and follow-up hives only developed after the meropenem. He is only taken 1 dose of the fear of the meropenem and now is on vancomycin. 3/25; the patient was seen by Dr. Linus [name] on 3/22. Because of intolerances to meropenem which I communicated with Dr. De [name] about he is now on daptomycin and Cipro until sometime in early April. He seems to be tolerating the antibiotics well. We are using Santyl back by wet-to-dry dressings. And making some improvement in the general surface of this very large wound 4/8; we are using Santyl back by wet-to-dry. We are making progress in terms of prepping the wound for a wound VAC. Still necrotic material requiring debridement. 4/16; we are using Santyl back by wet-to-dry. Not convinced that his wife is packing this properly there is a 6 cm tunnel at 12:00. We brought this to attention. Still requiring mechanical debridement 4/29; 2-week follow-up. In general the wound is better however there is considerable tunneling tunneling to the greater trochanter itself. There is still some debris in the deeper parts of this wound I  did not do mechanical debridement on this today. I am going to change the dressing to quarter strength Dakin's wet-to-dry. We will put a wound VAC through his insurance. His wife has had brain damage from a previous meningioma surgery. She is not comfortable around strangers in her home. The patient is concerned about  this 5/18; patient is using quarter strength Dakin's wet to dry. The problem is a deep pressure area over the left greater trochanter. There was some question from her intake nurses that this is actually come in and some question raised about whether he would require a wound VAC or not. The issue with the wound VAC was complicated predominantly by the patient's reluctance to allow people into his home i.e. home health although he tells me he has this mostly "sorted out" 7/1; this is a patient that I have not seen in almost 6 weeks. Apparently canceled several appointments related to family problems deaths in the family etc. He is wife has ophthalmologic surgery next week. I had been trying to get a wound VAC ordered for this deep probing wound over the left greater trochanter but so far I think he is simply doing a Dakin's wet-to-dry and our intake nurse did not think there was any Dakin's on it. I have a note from Dr. Tommy [name] of infectious disease on 6/30. He has now been off doxycycline and ciprofloxacin for several weeks. He has no undue pain or drainage. He also had discitis and osteomyelitis at L1-L2 with a 1 cm left psoas abscess as well as some increased signal at T10/T11. He is not having any problems related to this. 12/16/2019 on evaluation today patient's wound actually is showing some signs of bit of improvement which is good news. Fortunately there is no signs of active infection at this time which is good news. He tells me that the nurses have been coming to change this 3 times a week and doing a great job. He told the intake nurse that they had only been coming out once a week. I am not really sure what is going on with the discrepancy and stories here. Nonetheless I do believe that he needs to have this changed 3 times a week on a regular basis he does seem to have a little bit of a fungal infection around the wound. 8/19; 2-week follow-up. I have not seen this patient in over 6 weeks.  There is been a dramatic improvement in the overall wound volume. Circumference of the wound is now small although there is still considerable undermining from roughly 9-2 o'clock maximum 1:00 at roughly 4.7 cm. There is no purulent drainage. He has some irritation around the wound on the normal skin. They are using miconazole on this. 9/2; 2-week follow-up. Dimensions of the wound circumference look quite good. There is tunneling at 4.3 cm at 1:00 versus 4.7 last time. The area continues to look like it is contracting with the wound VAC and I think the treatment of choice here will be continuing to the wound VAC as long as it seems practical. There is no evidence of infection. As far as I am aware he is offloading this aggressively 9/16; 2-week follow-up. Left greater trochanter. Dimensions about the same still about 4.9 cm of maximal undermining. Nevertheless the visible tissue that I can see looks healthy. We have a wound VAC and really from the perspective of how this looks initially this is gotten a lot better however not much change in the last week  9/30 2-week follow-up. Left greater trochanter. He has a tunnel at 12:00 that goes about 7 cm. From 10-12 lesser degrees of undermining. HOWEVER he comes in today telling us that he is going to have to go back to work. He will not be able to work with a wound VAC and he wants that discontinued. There was not much discussion. This is a financial issue for him. He also has erythema around the wound orifice which has satellite lesions around it this is no doubt cutaneous candidiasis 10/12; 2-week follow-up. Left greater trochanter. 7.2 cm of tunneling. 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 He has not been systemically unwell. 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 He is using silver alginate which he is  dressing himself, ABDs with a large plastic occlusive dressing. Dimensions are somewhat better although this is an irregular wound. When he was here the last time he had a lot of drainage coming out of this wound. The culture I did showed methicillin sensitive staph aureus. I gave him 10 days of doxycycline and he is just finishing up on this. He states the drainage is a lot better 04/03/2020 upon evaluation today patient appears to be doing well with regard to his wound as far as the overall appearance is concerned the one issue that he is noting is that he does have some green drainage occurring at times. Obviously this brings up the concern for Pseudomonas I will see anything visually right now that has me overly concerned about at the same time that history does concern me. The patient would like to see if we can attempt to treat this today. I think that being that green drainage is pretty much pathognomonic for a Pseudomonas infection I think he can continue to doxycycline but probably should do a couple weeks of Cipro to try to see if that clears up the drainage if not then we may need to delve into a more extensive culture possibly PCR at that point. With regard to the wound itself also believe based on talking with the patient today that he could be a candidate for a flap surgery that something he would like to consider READMISSION 07/04/2020 Ivan [name] is a 75 year old man we had in clinic until the end of November 2021. He had a substantial wound on the left greater trochanter that required an original IandD by Dr. Marla [name] early in 2021. This was an infected wound and had IV antibiotics directed by infectious disease. He also had an L1-L2 discitis. We may have him in clinic here we initially put him in a wound VAC and he actually did quite well with this with filling in of the underlying tissue however he had to go back to work. 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 He is still working there 40 hours a week second shift. He was coming to this clinic until late in November I am not exactly sure what happened. I had not referred him to plastic surgery because he would have to inevitably take time off work that he was not prepared to do. The orifice of the wound was too small to really reconsider a wound VAC and that still true. He arrives back in clinic with a direct depth of the wound at 1.5 cm  that tunnel goes to 3.1 cm from 3.6 last time. He saw Dr. Johnnye Sima on 04/14/2020. He ordered a follow-up MRI. This showed persistence of a peritrochanteric fluid collection in the bursa which was slightly larger than the previous image. This communicated with the wounds. The patient still says he is having clear fluid drainage and he is changing his dressing twice a day. He is currently using predominantly half-strength Dakin's wet-to-dry and he correctly notes that the wound is clean and has come in significantly albeit slowly. He has not noticed any systemic symptoms. No real pain. Last measurements of his C-reactive protein and sedimentation rate were 3.7 and 2 respectively. He was supposed to follow-up with Dr. Johnnye Sima I am not sure that he has. Dr. Johnnye Sima had tried to organize an appointment with Dr. Marla [name] I do not think that ever got done either. The patient tells me he is not willing to consider any further surgery to this area until he has short-term disability time which he thinks will happen sometime in May or June 3/22; monthly follow-up. Minimal change in the important measurements of depth at 1.7 and the 12:00 tunnel at 2.6. We have been referred back to Dr. Marla [name] I think by Dr. Johnnye Sima although he does not yet have a plastics appointment 10/24/2020; patient dropped off our schedule when he returns to clinic with for review of the same wound over the left greater trochanter. He says the reason is because of medical issues including TUR of a benign  bladder tumor. He also had cataract issues. In any case he has been using a hydrogel wet-to-dry to his left hip wound. He is working full-time this does not limit him. Direct depth today measured at 2.1 with undermining from 8-12 also with 2.1. This represents some improvement. He tells me he never did see Dr. Marla [name] again because he could not coordinate another appointment. He is not really wanting to do the flap surgery type of thing he says this does not inhibit his lifestyle 7/14; 1 month follow-up he is using hydrogel wet-to-dry. The wound has 2 cm of direct depth and a 2.5 cm tunnel at 1:00 I am not sure that this has changed too much. Once again I have talked to him about whether he wants to try a different dressing Hydrofera Blue packing rope, polymen Ag rope etc. He does not seem at all interested in this. Wants to do the hydrogel wet-to-dry packing for another month 8/25; 1 month follow-up using hydrogel wet-to-dry. I think he is actually done better. Wound is come in since the last time he was here a month ago. He reports no other issues 9/22; he is using hydrogel wet-to-dry predominantly however recently he changed to Dakin's wet-to-dry because he perceived an odor. Not much change in dimensions per our intake nurses. He has no systemic symptoms. Electronic Signature(s) Signed: 02/01/2021 4:58:28 PM By: [name] MD Entered By: [name] on 02/01/2021 10:32:10 -------------------------------------------------------------------------------- Physical Exam Details Patient Name: Date of Service: Ivan [name], Ivan BERT W. 02/01/2021 9:30 A M Medical Record Number: [id number] Patient Account Number: [id number] Date of Birth/Sex: Treating RN: 1946/02/20 (75 y.o. Ivan [name] Primary Care Provider: Dorris [name] Other Clinician: Referring Provider: Treating Provider/Extender: Ronnie Derby, [name] in Treatment: 14 Constitutional Sitting or standing Blood  Pressure is within target range for patient.. Pulse regular and within target range for patient.Marland Kitchen Respirations regular, non-labored and within target range.. Temperature is normal and within the target range for the patient.Marland Kitchen Appears  in no distress. Notes Wound exam; right greater trochanter. I think the depth is about the same small surface orifice. There is no purulence some surrounding erythema but this more looks like surface bandage irritation than infection. There is no palpable bone no palpable crepitus. Electronic Signature(s) Signed: 02/01/2021 4:58:28 PM By: [name] MD Entered By: [name] on 02/01/2021 10:33:00 -------------------------------------------------------------------------------- Physician Orders Details Patient Name: Date of Service: Ivan [name], Ivan BERT W. 02/01/2021 9:30 A M Medical Record Number: [id number] Patient Account Number: [id number] Date of Birth/Sex: Treating RN: [birth year] (75 y.o. [name] Primary Care Provider: Dorris [name] Other Clinician: Referring Provider: Treating Provider/Extender: Ivan [name] in Treatment: 14 Verbal / Phone Orders: No Diagnosis Coding ICD-10 Coding Code Description [phone] Pressure ulcer of left hip, stage 4 Follow-up Appointments Return appointment in 1 month. - with Dr. Dellia Nims Bathing/ Shower/ Hygiene May shower and wash wound with soap and water. - prior to dressing change Off-Loading Turn and reposition every 2 hours Wound Treatment Wound #3 - Trochanter Wound Laterality: Left Cleanser: Soap and Water 1 x Per Day/30 Days Discharge Instructions: May shower and wash wound with dial antibacterial soap and water prior to dressing change. Cleanser: Wound Cleanser 1 x Per Day/30 Days Discharge Instructions: Cleanse the wound with wound cleanser prior to applying a clean dressing using gauze sponges, not tissue or cotton balls. Topical: Skintegrity Hydrogel 4 (oz) 1 x Per  Day/30 Days Discharge Instructions: Apply hydrogel as directed Secondary Dressing: ABD Pad, 5x9 (DME) (Generic) 1 x Per Day/30 Days Discharge Instructions: Apply over primary dressing as directed. Secured With: Child psychotherapist, Sterile 2x75 (in/in) (DME) (Generic) 1 x Per Day/30 Days Discharge Instructions: Cut and moisten with Hydrogel, pack into wound Secured With: 46M Medipore H Soft Cloth Surgical T 4 x 2 (in/yd) 1 x Per Day/30 Days ape Discharge Instructions: Secure dressing with tape as directed. Electronic Signature(s) Signed: 02/01/2021 4:58:28 PM By: [name] MD Signed: 02/01/2021 5:17:03 PM By: Levan Hurst RN, BSN Entered By: Levan Hurst on 02/01/2021 09:59:03 -------------------------------------------------------------------------------- Problem List Details Patient Name: Date of Service: Ivan [name], Ivan BERT W. 02/01/2021 9:30 A M Medical Record Number: [id number] Patient Account Number: [id number] Date of Birth/Sex: Treating RN: 14-Sep-1945 (75 y.o. [name] Primary Care Provider: Dorris [name] Other Clinician: Referring Provider: Treating Provider/Extender: Ivan [name] in Treatment: 14 Active Problems ICD-10 Encounter Code Description Active Date MDM Diagnosis L89.224 Pressure ulcer of left hip, stage 4 10/24/2020 No Yes Inactive Problems Resolved Problems Electronic Signature(s) Signed: 02/01/2021 4:58:28 PM By: [name] MD Entered By: [name] on 02/01/2021 10:31:19 -------------------------------------------------------------------------------- Progress Note Details Patient Name: Date of Service: Ivan [name], Ivan BERT W. 02/01/2021 9:30 A M Medical Record Number: [id number] Patient Account Number: [id number] Date of Birth/Sex: Treating RN: 11-04-1945 (75 y.o. 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 He required admission to hospital from 1/5 through 1/9 with Covid pneumonia. At that point he apparently left AMA. He was noted to have a left hip bruise at the time of discharge. I suspect this really was a deep tissue injury. He was readmitted from 1/13 through 1//27 with altered mental status Covid pneumonia and delirium. At that point he had a left hip and thigh wound which  had worsened. Felt to be a pressure injury. He was seen by general surgery who recommended normal saline wet-to-dry twice daily he did not have a debridement. After he left at that point he was incapacitated by increasing lumbar pain. An MRI on 2/9 showed discitis and osteomyelitis at L1/2. He asked aspiration of 1 cm fluid however I think this culture negative. He was also noted at this point to have a very large wound over the left greater trochanter. He was taken to the OR by Dr. Marla [name] for debridement with ACell placement. He was placed in a wound VAC. A culture at that point showed Enterococcus and Enterobacter. An MRI on 3/15 showed no evidence of left hip septic arthritis or osteomyelitis. Extensive subcutaneous edema within the visualized soft tissue of the left thigh and prominent left inguinal lymph nodes likely reactive. He was started on IV cefepime and vancomycin for 6 weeks. He was discharged to [place] facility as the patient. There I gather the wound developed the necrotic surface. He was seen by the wound care doctor in house who correctly pointed out that a wound VAC would not do anything with a necrotic surface. Since then he has been using Santyl The patient is now at home. He was on vancomycin and cefepime. He developed hives and a generalized rash and was seen in the ER on 3/15 by Dr. Cain [name]  recommendation he was changed to meropenem however he also apparently developed hives after this. He says the original hives stopped after the cefepime was stopped and follow-up hives only developed after the meropenem. He is only taken 1 dose of the fear of the meropenem and now is on vancomycin. 3/25; the patient was seen by Dr. Linus [name] on 3/22. Because of intolerances to meropenem which I communicated with Dr. De [name] about he is now on daptomycin and Cipro until sometime in early April. He seems to be tolerating the antibiotics well. We are using Santyl back by wet-to-dry dressings. And making some improvement in the general surface of this very large wound 4/8; we are using Santyl back by wet-to-dry. We are making progress in terms of prepping the wound for a wound VAC. Still necrotic material requiring debridement. 4/16; we are using Santyl back by wet-to-dry. Not convinced that his wife is packing this properly there is a 6 cm tunnel at 12:00. We brought this to attention. Still requiring mechanical debridement 4/29; 2-week follow-up. In general the wound is better however there is considerable tunneling tunneling to the greater trochanter itself. There is still some debris in the deeper parts of this wound I did not do mechanical debridement on this today. I am going to change the dressing to quarter strength Dakin's wet-to-dry. We will put a wound VAC through his insurance. His wife has had brain damage from a previous meningioma surgery. She is not comfortable around strangers in her home. The patient is concerned about this 5/18; patient is using quarter strength Dakin's wet to dry. The problem is a deep pressure area over the left greater trochanter. There was some question from her intake nurses that this is actually come in and some question raised about whether he would require a wound VAC or not. The issue with the wound VAC was complicated predominantly by the patient's reluctance to  allow people into his home i.e. home health although he tells me he has this mostly "sorted out" 7/1; this is a patient that I have not seen in almost 6 weeks. Apparently canceled  several appointments related to family problems deaths in the family etc. He is wife has ophthalmologic surgery next week. I had been trying to get a wound VAC ordered for this deep probing wound over the left greater trochanter but so far I think he is simply doing a Dakin's wet-to-dry and our intake nurse did not think there was any Dakin's on it. I have a note from Dr. Tommy [name] of infectious disease on 6/30. He has now been off doxycycline and ciprofloxacin for several weeks. He has no undue pain or drainage. He also had discitis and osteomyelitis at L1-L2 with a 1 cm left psoas abscess as well as some increased signal at T10/T11. He is not having any problems related to this. 12/16/2019 on evaluation today patient's wound actually is showing some signs of bit of improvement which is good news. Fortunately there is no signs of active infection at this time which is good news. He tells me that the nurses have been coming to change this 3 times a week and doing a great job. He told the intake nurse that they had only been coming out once a week. I am not really sure what is going on with the discrepancy and stories here. Nonetheless I do believe that he needs to have this changed 3 times a week on a regular basis he does seem to have a little bit of a fungal infection around the wound. 8/19; 2-week follow-up. I have not seen this patient in over 6 weeks. There is been a dramatic improvement in the overall wound volume. Circumference of the wound is now small although there is still considerable undermining from roughly 9-2 o'clock maximum 1:00 at roughly 4.7 cm. There is no purulent drainage. He has some irritation around the wound on the normal skin. They are using miconazole on this. 9/2; 2-week follow-up. Dimensions of the  wound circumference look quite good. There is tunneling at 4.3 cm at 1:00 versus 4.7 last time. The area continues to look like it is contracting with the wound VAC and I think the treatment of choice here will be continuing to the wound VAC as long as it seems practical. There is no evidence of infection. As far as I am aware he is offloading this aggressively 9/16; 2-week follow-up. Left greater trochanter. Dimensions about the same still about 4.9 cm of maximal undermining. Nevertheless the visible tissue that I can see looks healthy. We have a wound VAC and really from the perspective of how this looks initially this is gotten a lot better however not much change in the last week 9/30 2-week follow-up. Left greater trochanter. He has a tunnel at 12:00 that goes about 7 cm. From 10-12 lesser degrees of undermining. HOWEVER he comes in today telling us that he is going to have to go back to work. He will not be able to work with a wound VAC and he wants that discontinued. There was not much discussion. This is a financial issue for him. He also has erythema around the wound orifice which has satellite lesions around it this is no doubt cutaneous candidiasis 10/12; 2-week follow-up. Left greater trochanter. 7.2 cm of tunneling. 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 He has not been systemically unwell. 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 He is using silver alginate  which he is dressing himself, ABDs with a large plastic occlusive dressing. Dimensions are somewhat better although this is an irregular wound. When he was here the last time he had a lot of drainage coming out of this wound. The culture I did showed methicillin sensitive staph aureus. I gave him 10 days of doxycycline and he is just finishing up on this. He states the drainage is a lot  better 04/03/2020 upon evaluation today patient appears to be doing well with regard to his wound as far as the overall appearance is concerned the one issue that he is noting is that he does have some green drainage occurring at times. Obviously this brings up the concern for Pseudomonas I will see anything visually right now that has me overly concerned about at the same time that history does concern me. The patient would like to see if we can attempt to treat this today. I think that being that green drainage is pretty much pathognomonic for a Pseudomonas infection I think he can continue to doxycycline but probably should do a couple weeks of Cipro to try to see if that clears up the drainage if not then we may need to delve into a more extensive culture possibly PCR at that point. With regard to the wound itself also believe based on talking with the patient today that he could be a candidate for a flap surgery that something he would like to consider READMISSION 07/04/2020 Ivan [name] is a 75 year old man we had in clinic until the end of November 2021. He had a substantial wound on the left greater trochanter that required an original IandD by Dr. Marla [name] early in 2021. This was an infected wound and had IV antibiotics directed by infectious disease. He also had an L1-L2 discitis. We may have him in clinic here we initially put him in a wound VAC and he actually did quite well with this with filling in of the underlying tissue however he had to go back to work. 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 He is still working there 40 hours a week second shift. He was coming to this clinic until late in November I am not exactly sure what happened. I had not referred him to plastic surgery because he would have to inevitably take time off work that he was not prepared to do. The orifice of the wound was too small to really reconsider a wound VAC and that still true. He  arrives back in clinic with a direct depth of the wound at 1.5 cm that tunnel goes to 3.1 cm from 3.6 last time. He saw Dr. Johnnye Sima on 04/14/2020. He ordered a follow-up MRI. This showed persistence of a peritrochanteric fluid collection in the bursa which was slightly larger than the previous image. This communicated with the wounds. The patient still says he is having clear fluid drainage and he is changing his dressing twice a day. He is currently using predominantly half-strength Dakin's wet-to-dry and he correctly notes that the wound is clean and has come in significantly albeit slowly. He has not noticed any systemic symptoms. No real pain. Last measurements of his C-reactive protein and sedimentation rate were 3.7 and 2 respectively. He was supposed to follow-up with Dr. Johnnye Sima I am not sure that he has. Dr. Johnnye Sima had tried to organize an appointment with Dr. Marla [name] I do not think that ever got done either. The patient tells me he is not  willing to consider any further surgery to this area until he has short-term disability time which he thinks will happen sometime in May or June 3/22; monthly follow-up. Minimal change in the important measurements of depth at 1.7 and the 12:00 tunnel at 2.6. We have been referred back to Dr. Marla [name] I think by Dr. Johnnye Sima although he does not yet have a plastics appointment 10/24/2020; patient dropped off our schedule when he returns to clinic with for review of the same wound over the left greater trochanter. He says the reason is because of medical issues including TUR of a benign bladder tumor. He also had cataract issues. In any case he has been using a hydrogel wet-to-dry to his left hip wound. He is working full-time this does not limit him. Direct depth today measured at 2.1 with undermining from 8-12 also with 2.1. This represents some improvement. He tells me he never did see Dr. Marla [name] again because he could not coordinate another  appointment. He is not really wanting to do the flap surgery type of thing he says this does not inhibit his lifestyle 7/14; 1 month follow-up he is using hydrogel wet-to-dry. The wound has 2 cm of direct depth and a 2.5 cm tunnel at 1:00 I am not sure that this has changed too much. Once again I have talked to him about whether he wants to try a different dressing Hydrofera Blue packing rope, polymen Ag rope etc. He does not seem at all interested in this. Wants to do the hydrogel wet-to-dry packing for another month 8/25; 1 month follow-up using hydrogel wet-to-dry. I think he is actually done better. Wound is come in since the last time he was here a month ago. He reports no other issues 9/22; he is using hydrogel wet-to-dry predominantly however recently he changed to Dakin's wet-to-dry because he perceived an odor. Not much change in dimensions per our intake nurses. He has no systemic symptoms. Objective Constitutional Sitting or standing Blood Pressure is within target range for patient.. Pulse regular and within target range for patient.Marland Kitchen Respirations regular, non-labored and within target range.. Temperature is normal and within the target range for the patient.Marland Kitchen Appears in no distress. Vitals Time Taken: 9:42 AM, Height: 70 in, Weight: 187 lbs, BMI: 26.8, Temperature: 98.3 F, Pulse: 72 bpm, Respiratory Rate: 16 breaths/min, Blood Pressure: 134/65 mmHg. General Notes: Wound exam; right greater trochanter. I think the depth is about the same small surface orifice. There is no purulence some surrounding erythema but this more looks like surface bandage irritation than infection. There is no palpable bone no palpable crepitus. Integumentary (Hair, Skin) Wound #3 status is Open. Original cause of wound was Pressure Injury. The date acquired was: 05/18/2019. The wound has been in treatment 14 weeks. The wound is located on the Left Trochanter. The wound measures 1cm length x 0.5cm width x  1.6cm depth; 0.393cm^2 area and 0.628cm^3 volume. There is Fat Layer (Subcutaneous Tissue) exposed. There is no tunneling noted, however, there is undermining starting at 7:00 and ending at 12:00 with a maximum distance of 2.5cm. There is a medium amount of serosanguineous drainage noted. The wound margin is well defined and not attached to the wound base. There is large (67-100%) red granulation within the wound bed. There is no necrotic tissue within the wound bed. Assessment Active Problems ICD-10 Pressure ulcer of left hip, stage 4 Plan Follow-up Appointments: Return appointment in 1 month. - with Dr. Dellia Nims Bathing/ Shower/ Hygiene: May shower and wash wound  with soap and water. - prior to dressing change Off-Loading: Turn and reposition every 2 hours WOUND #3: - Trochanter Wound Laterality: Left Cleanser: Soap and Water 1 x Per Day/30 Days Discharge Instructions: May shower and wash wound with dial antibacterial soap and water prior to dressing change. Cleanser: Wound Cleanser 1 x Per Day/30 Days Discharge Instructions: Cleanse the wound with wound cleanser prior to applying a clean dressing using gauze sponges, not tissue or cotton balls. Topical: Skintegrity Hydrogel 4 (oz) 1 x Per Day/30 Days Discharge Instructions: Apply hydrogel as directed Secondary Dressing: ABD Pad, 5x9 (DME) (Generic) 1 x Per Day/30 Days Discharge Instructions: Apply over primary dressing as directed. Secured With: Child psychotherapist, Sterile 2x75 (in/in) (DME) (Generic) 1 x Per Day/30 Days Discharge Instructions: Cut and moisten with Hydrogel, pack into wound Secured With: 46M Medipore H Soft Cloth Surgical T 4 x 2 (in/yd) 1 x Per Day/30 Days ape Discharge Instructions: Secure dressing with tape as directed. 1. The patient is going to continue with his mixture of Dakin's wet-to-dry or hydrogel wet-to-dry 2. His usual I have offered him silver collagen as an alternative to see if we can  stimulate more granulation he is not really that interested 3. I did not see any concrete evidence of infection although his comment about the odor was a bit concerning. I did not appreciate any odor and that it is our nurses. If he continues to appreciate this we certainly could bring him back in a culture I did not do this today Electronic Signature(s) Signed: 02/01/2021 4:58:28 PM By: [name] MD Entered By: [name] on 02/01/2021 10:34:08 -------------------------------------------------------------------------------- SuperBill Details Patient Name: Date of Service: Ivan [name], Ivan BERT W. 02/01/2021 Medical Record Number: [id number] Patient Account Number: [id number] Date of Birth/Sex: Treating RN: 15-Jul-1945 (75 y.o. [name] Primary Care Provider: Dorris [name] Other Clinician: Referring Provider: Treating Provider/Extender: Ronnie Derby, [name] in Treatment: 14 Diagnosis Coding ICD-10 Codes Code Description [phone] Pressure ulcer of left hip, stage 4 Facility Procedures CPT4 Code: [id number] Description: 41962 - WOUND CARE VISIT-LEV 3 EST PT Modifier: Quantity: 1 Physician Procedures : CPT4 Code Description Modifier [id number] - WC PHYS LEVEL 3 - EST PT ICD-10 Diagnosis Description L89.224 Pressure ulcer of left hip, stage 4 Quantity: 1 Electronic Signature(s) Signed: 02/01/2021 4:58:28 PM By: [name] MD Entered By: [name] on 02/01/2021 10:34:22

## 2021-02-26 ENCOUNTER — Other Ambulatory Visit: Payer: Self-pay | Admitting: Infectious Diseases

## 2021-02-26 DIAGNOSIS — M86452 Chronic osteomyelitis with draining sinus, left femur: Secondary | ICD-10-CM

## 2021-03-01 ENCOUNTER — Encounter (HOSPITAL_BASED_OUTPATIENT_CLINIC_OR_DEPARTMENT_OTHER): Payer: Medicare HMO | Admitting: Internal Medicine

## 2021-03-08 ENCOUNTER — Encounter (HOSPITAL_BASED_OUTPATIENT_CLINIC_OR_DEPARTMENT_OTHER): Payer: Medicare HMO | Admitting: Internal Medicine

## 2021-06-15 ENCOUNTER — Encounter (HOSPITAL_BASED_OUTPATIENT_CLINIC_OR_DEPARTMENT_OTHER): Payer: Medicare HMO | Attending: Internal Medicine | Admitting: Internal Medicine

## 2021-06-15 ENCOUNTER — Other Ambulatory Visit: Payer: Self-pay

## 2021-06-15 DIAGNOSIS — L89223 Pressure ulcer of left hip, stage 3: Secondary | ICD-10-CM | POA: Diagnosis not present

## 2021-06-15 DIAGNOSIS — Z87891 Personal history of nicotine dependence: Secondary | ICD-10-CM | POA: Insufficient documentation

## 2021-06-15 DIAGNOSIS — I10 Essential (primary) hypertension: Secondary | ICD-10-CM

## 2021-06-15 DIAGNOSIS — M1990 Unspecified osteoarthritis, unspecified site: Secondary | ICD-10-CM | POA: Diagnosis not present

## 2021-06-15 DIAGNOSIS — M4628 Osteomyelitis of vertebra, sacral and sacrococcygeal region: Secondary | ICD-10-CM

## 2021-06-15 NOTE — Progress Notes (Signed)
Ivan [name] ([id number]) Visit Report for 06/15/2021 Chief Complaint Document Details Patient Name: Date of Service: Ivan [name]. 06/15/2021 9:00 A M Medical Record Number: [id number] Patient Account Number: [id number] Date of Birth/Sex: Treating RN: 05-Mar-1946 (76 y.o. Male) Ivan [name] Primary Care Provider: [name] Other Clinician: Referring Provider: Treating Provider/Extender: Yaakov Guthrie in Treatment: 0 Information Obtained from: Patient Chief Complaint 07/29/2019; patient is here for review of a fairly substantial wound over the left greater trochanter 07/04/2020; patient returns to clinic for follow-up of the original ulcer over the left greater trochanter 10/24/2020; patient returns to clinic for follow-up of his original ulcer over the left greater trochanter 06/15/2021; patient returns the clinic for original ulcer over the left greater trochanter Electronic Signature(s) Signed: 06/15/2021 10:49:49 AM By: Kalman Shan DO Entered By: Kalman Shan on 06/15/2021 10:40:56 -------------------------------------------------------------------------------- Debridement Details Patient Name: Date of Service: Ivan [name], [name] 06/15/2021 9:00 A M Medical Record Number: [id number] Patient Account Number: [id number] Date of Birth/Sex: Treating RN: 1946-03-12 (76 y.o. Male) [name] Primary Care Provider: [name] Other Clinician: Referring Provider: Treating Provider/Extender: Yaakov Guthrie in Treatment: 0 Debridement Performed for Assessment: Wound #4 Left Trochanter Performed By: Physician Kalman Shan, DO Debridement Type: Chemical/Enzymatic/Mechanical Agent Used: Gauze Level of Consciousness (Pre-procedure): Awake and Alert Pre-procedure Verification/Time Out Yes - 09:55 Taken: Start Time: 09:56 Bleeding: None End Time: 10:03 Response to Treatment: Procedure was tolerated well Level of Consciousness (Post- Awake and  Alert procedure): Post Debridement Measurements of Total Wound Length: (cm) 1 Stage: Category/Stage III Width: (cm) 0.3 Depth: (cm) 0.9 Volume: (cm) 0.212 Character of Wound/Ulcer Post Debridement: Stable Post Procedure Diagnosis Same as Pre-procedure Electronic Signature(s) Signed: 06/15/2021 10:49:49 AM By: Kalman Shan DO Signed: 06/15/2021 1:32:11 PM By: [name] Entered By: [name] on 06/15/2021 10:02:43 -------------------------------------------------------------------------------- HPI Details Patient Name: Date of Service: Ivan [name], [name] 06/15/2021 9:00 A M Medical Record Number: [id number] Patient Account Number: [id number] Date of Birth/Sex: Treating RN: 11-02-1945 (76 y.o. 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 He required admission to hospital from 1/5 through 1/9 with Covid pneumonia. At that point he apparently left AMA. He was noted to have a left hip bruise at the time of discharge. I suspect this really was a deep tissue injury. He was readmitted from 1/13 through 1//27 with altered mental status Covid pneumonia and delirium. At that point he had a left hip and thigh wound which had worsened. Felt to be a pressure injury. He was seen by general surgery who recommended normal saline wet-to-dry twice daily he did not have a debridement. After he left at that point he was incapacitated by increasing lumbar pain. An MRI on 2/9 showed discitis and osteomyelitis at L1/2. He asked aspiration of 1 cm fluid however I think this culture negative. He was also noted at this point to have a very large wound over the left greater trochanter. He was taken to the OR by Dr. Marla Roe for  debridement with ACell placement. He was placed in a wound VAC. A culture at that point showed Enterococcus and Enterobacter. An MRI on 3/15 showed no evidence of left hip septic arthritis or osteomyelitis. Extensive subcutaneous edema within the visualized soft tissue of the left thigh and prominent left  inguinal lymph nodes likely reactive. He was started on IV cefepime and vancomycin for 6 weeks. He was discharged to [facility] facility as the patient. There I gather the wound developed the necrotic surface. He was seen by the wound care doctor in house who correctly pointed out that a wound VAC would not do anything with a necrotic surface. Since then he has been using Santyl The patient is now at home. He was on vancomycin and cefepime. He developed hives and a generalized rash and was seen in the ER on 3/15 by Dr. Cain Sieve recommendation he was changed to meropenem however he also apparently developed hives after this. He says the original hives stopped after the cefepime was stopped and follow-up hives only developed after the meropenem. He is only taken 1 dose of the fear of the meropenem and now is on vancomycin. 3/25; the patient was seen by Dr. Linus Salmons on 3/22. Because of intolerances to meropenem which I communicated with Dr. De Burrs about he is now on daptomycin and Cipro until sometime in early April. He seems to be tolerating the antibiotics well. We are using Santyl back by wet-to-dry dressings. And making some improvement in the general surface of this very large wound 4/8; we are using Santyl back by wet-to-dry. We are making progress in terms of prepping the wound for a wound VAC. Still necrotic material requiring debridement. 4/16; we are using Santyl back by wet-to-dry. Not convinced that his wife is packing this properly there is a 6 cm tunnel at 12:00. We brought this to attention. Still requiring mechanical debridement 4/29; 2-week follow-up. In general the wound is better however  there is considerable tunneling tunneling to the greater trochanter itself. There is still some debris in the deeper parts of this wound I did not do mechanical debridement on this today. I am going to change the dressing to quarter strength Dakin's wet-to-dry. We will put a wound VAC through his insurance. His wife has had brain damage from a previous meningioma surgery. She is not comfortable around strangers in her home. The patient is concerned about this 5/18; patient is using quarter strength Dakin's wet to dry. The problem is a deep pressure area over the left greater trochanter. There was some question from her intake nurses that this is actually come in and some question raised about whether he would require a wound VAC or not. The issue with the wound VAC was complicated predominantly by the patient's reluctance to allow people into his home i.e. home health although he tells me he has this mostly "sorted out" 7/1; this is a patient that I have not seen in almost 6 weeks. Apparently canceled several appointments related to family problems deaths in the family etc. He is wife has ophthalmologic surgery next week. I had been trying to get a wound VAC ordered for this deep probing wound over the left greater trochanter but so far I think he is simply doing a Dakin's wet-to-dry and our intake nurse did not think there was any Dakin's on it. I have a note from Dr. Tommy Medal of infectious disease on 6/30. He has now been off doxycycline and ciprofloxacin for several weeks. He has no undue pain or drainage. He also had discitis and osteomyelitis at L1-L2 with a 1 cm left psoas abscess as well as some increased signal at T10/T11. He is not having any problems related to this. 12/16/2019 on evaluation today patient's wound actually is showing some signs of bit of  improvement which is good news. Fortunately there is no signs of active infection at this time which is good news. He tells me that the nurses  have been coming to change this 3 times a week and doing a great job. He told the intake nurse that they had only been coming out once a week. I am not really sure what is going on with the discrepancy and stories here. Nonetheless I do believe that he needs to have this changed 3 times a week on a regular basis he does seem to have a little bit of a fungal infection around the wound. 8/19; 2-week follow-up. I have not seen this patient in over 6 weeks. There is been a dramatic improvement in the overall wound volume. Circumference of the wound is now small although there is still considerable undermining from roughly 9-2 o'clock maximum 1:00 at roughly 4.7 cm. There is no purulent drainage. He has some irritation around the wound on the normal skin. They are using miconazole on this. 9/2; 2-week follow-up. Dimensions of the wound circumference look quite good. There is tunneling at 4.3 cm at 1:00 versus 4.7 last time. The area continues to look like it is contracting with the wound VAC and I think the treatment of choice here will be continuing to the wound VAC as long as it seems practical. There is no evidence of infection. As far as I am aware he is offloading this aggressively 9/16; 2-week follow-up. Left greater trochanter. Dimensions about the same still about 4.9 cm of maximal undermining. Nevertheless the visible tissue that I can see looks healthy. We have a wound VAC and really from the perspective of how this looks initially this is gotten a lot better however not much change in the last week 9/30 2-week follow-up. Left greater trochanter. He has a tunnel at 12:00 that goes about 7 cm. From 10-12 lesser degrees of undermining. HOWEVER he comes in today telling us that he is going to have to go back to work. He will not be able to work with a wound VAC and he wants that discontinued. There was not much discussion. This is a financial issue for him. He also has erythema around the wound  orifice which has satellite lesions around it this is no doubt cutaneous candidiasis 10/12; 2-week follow-up. Left greater trochanter. 7.2 cm of tunneling. 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 He has not been systemically unwell. 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 He is using silver alginate which he is dressing himself, ABDs with a large plastic occlusive dressing. Dimensions are somewhat better although this is an irregular wound. When he was here the last time he had a lot of drainage coming out of this wound. The culture I did showed methicillin sensitive staph aureus. I gave him 10 days of doxycycline and he is just finishing up on this. He states the drainage is a lot better 04/03/2020 upon evaluation today patient appears to be doing well with regard to his wound as far as the overall appearance is concerned the one issue that he is noting is that he does have some green drainage occurring at times. Obviously this brings up the concern for Pseudomonas I will see anything visually right now that has me overly concerned about at the same time that history  does concern me. The patient would like to see if we can attempt to treat this today. I think that being that green drainage is pretty much pathognomonic for a Pseudomonas infection I think he can continue to doxycycline but probably should do a couple weeks of Cipro to try to see if that clears up the drainage if not then we may need to delve into a more extensive culture possibly PCR at that point. With regard to the wound itself also believe based on talking with the patient today that he could be a candidate for a flap surgery that something he would like to consider READMISSION 07/04/2020 Mr. Fare is a 76 year old man we had in clinic until the end of November 2021. He had a  substantial wound on the left greater trochanter that required an original IandD by Dr. Marla Roe early in 2021. This was an infected wound and had IV antibiotics directed by infectious disease. He also had an L1-L2 discitis. We may have him in clinic here we initially put him in a wound VAC and he actually did quite well with this with filling in of the underlying tissue however he had to go back to work. 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 He is still working there 40 hours a week second shift. He was coming to this clinic until late in November I am not exactly sure what happened. I had not referred him to plastic surgery because he would have to inevitably take time off work that he was not prepared to do. The orifice of the wound was too small to really reconsider a wound VAC and that still true. He arrives back in clinic with a direct depth of the wound at 1.5 cm that tunnel goes to 3.1 cm from 3.6 last time. He saw Dr. Johnnye Sima on 04/14/2020. He ordered a follow-up MRI. This showed persistence of a peritrochanteric fluid collection in the bursa which was slightly larger than the previous image. This communicated with the wounds. The patient still says he is having clear fluid drainage and he is changing his dressing twice a day. He is currently using predominantly half-strength Dakin's wet-to-dry and he correctly notes that the wound is clean and has come in significantly albeit slowly. He has not noticed any systemic symptoms. No real pain. Last measurements of his C-reactive protein and sedimentation rate were 3.7 and 2 respectively. He was supposed to follow-up with Dr. Johnnye Sima I am not sure that he has. Dr. Johnnye Sima had tried to organize an appointment with Dr. Marla Roe I do not think that ever got done either. The patient tells me he is not willing to consider any further surgery to this area until he has short-term disability time which he thinks will  happen sometime in May or June 3/22; monthly follow-up. Minimal change in the important measurements of depth at 1.7 and the 12:00 tunnel at 2.6. We have been referred back to Dr. Marla Roe I think by Dr. Johnnye Sima although he does not yet have a plastics appointment 10/24/2020; patient dropped off our schedule when he returns to clinic with for review of the same wound over the left greater trochanter. He says the reason is because of medical issues including TUR of a benign bladder tumor. He also had cataract issues. In any case he has been using a hydrogel wet-to-dry to his left hip wound. He is working full-time this does not limit him. Direct depth today measured at 2.1  with undermining from 8-12 also with 2.1. This represents some improvement. He tells me he never did see Dr. Marla Roe again because he could not coordinate another appointment. He is not really wanting to do the flap surgery type of thing he says this does not inhibit his lifestyle 7/14; 1 month follow-up he is using hydrogel wet-to-dry. The wound has 2 cm of direct depth and a 2.5 cm tunnel at 1:00 I am not sure that this has changed too much. Once again I have talked to him about whether he wants to try a different dressing Hydrofera Blue packing rope, polymen Ag rope etc. He does not seem at all interested in this. Wants to do the hydrogel wet-to-dry packing for another month 8/25; 1 month follow-up using hydrogel wet-to-dry. I think he is actually done better. Wound is come in since the last time he was here a month ago. He reports no other issues 9/22; he is using hydrogel wet-to-dry predominantly however recently he changed to Dakin's wet-to-dry because he perceived an odor. Not much change in dimensions per our intake nurses. He has no systemic symptoms. 2/30/2023 Mr. Domonick Sittner has followed in the clinic over several years for a wound to his left hip. This wound has never closed. He was last seen on 02/01/2021  and was using hydrogel wet-to-dry dressings. He currently has no issues or complaints today. He denies signs of infection. Electronic Signature(s) Signed: 06/15/2021 10:49:49 AM By: Kalman Shan DO Entered By: Kalman Shan on 06/15/2021 10:42:00 -------------------------------------------------------------------------------- Physical Exam Details Patient Name: Date of Service: Ivan [name], [name] 06/15/2021 9:00 A M Medical Record Number: [id number] Patient Account Number: [id number] Date of Birth/Sex: Treating RN: [birth year] (76 y.o. Male) Ivan [name] Primary Care Provider: [name] Other Clinician: Referring Provider: Treating Provider/Extender: Yaakov Guthrie in Treatment: 0 Constitutional respirations regular, non-labored and within target range for patient.Marland Kitchen Psychiatric pleasant and cooperative. Notes Left hip: Over the bony prominence there is an open wound with granulation tissue that does not appear healthy. The wound tunnels at the 1 o'clock position. No obvious signs of infection. Electronic Signature(s) Signed: 06/15/2021 10:49:49 AM By: Kalman Shan DO Entered By: Kalman Shan on 06/15/2021 10:43:04 -------------------------------------------------------------------------------- Physician Orders Details Patient Name: Date of Service: Ivan [name], [name] 06/15/2021 9:00 A M Medical Record Number: [id number] Patient Account Number: [id number] Date of Birth/Sex: Treating RN: [birth year] (76 y.o. Male) [name] Primary Care Provider: [name] Other Clinician: Referring Provider: Treating Provider/Extender: Yaakov Guthrie in Treatment: 0 Verbal / Phone Orders: No Diagnosis Coding Follow-up Appointments ppointment in 2 weeks. - with Dr. Heber [place] Return A Bathing/ Shower/ Hygiene May shower and wash wound with soap and water. Off-Loading Other: - Avoid prolonged pressure to area. Additional Orders / Instructions Follow  Nutritious Diet Wound Treatment Wound #4 - Trochanter Wound Laterality: Left Cleanser: Soap and Water 1 x Per Day/30 Days Discharge Instructions: May shower and wash wound with dial antibacterial soap and water prior to dressing change. Cleanser: Wound Cleanser 1 x Per Day/30 Days Discharge Instructions: Cleanse the wound with wound cleanser prior to applying a clean dressing using gauze sponges, not tissue or cotton balls. Peri-Wound Care: Skin Prep 1 x Per Day/30 Days Discharge Instructions: Use skin prep as directed Topical: Skintegrity Hydrogel 4 (oz) 1 x Per Day/30 Days Discharge Instructions: Patient to use Solosite Prim Dressing: Hydrofera Blue Rope ary 1 x Per Day/30 Days Secondary Dressing: Zetuvit Plus Silicone Border Dressing 4x4 (in/in) 1 x Per Day/30 Days  Discharge Instructions: Apply silicone border over primary dressing as directed. Radiology X-ray, other: Left Hip - Xray of left hip, chronic non-healing wound Electronic Signature(s) Signed: 06/15/2021 10:49:49 AM By: Kalman Shan DO Entered By: Kalman Shan on 06/15/2021 10:43:22 Prescription 06/15/2021 -------------------------------------------------------------------------------- [name]. Kalman Shan DO Patient Name: Provider: 07/27/1945 [id number] Date of Birth: NPI#: Male [id number] Sex: DEA #: [phone] Phone #: License #: [place] Patient Address: [address], [place] [address] [address], [place] [address] [phone] Allergies Iodinated Contrast Media; cefepime; meropenem; vancomycin; lisinopril Provider's Orders X-ray, other: Left Hip - Xray of left hip, chronic non-healing wound Hand Signature: Date(s): Electronic Signature(s) Signed: 06/15/2021 10:49:49 AM By: Kalman Shan DO Entered By: Kalman Shan on 06/15/2021  10:43:23 -------------------------------------------------------------------------------- Problem List Details Patient Name: Date of Service: Ivan [name], [name] 06/15/2021 9:00 A M Medical Record Number: [id number] Patient Account Number: [id number] Date of Birth/Sex: Treating RN: 03-05-1946 (76 y.o. Male) Ivan [name] Primary Care Provider: [name] Other Clinician: Referring Provider: Treating Provider/Extender: Yaakov Guthrie in Treatment: 0 Active Problems ICD-10 Encounter Code Description Active Date MDM Diagnosis L89.223 Pressure ulcer of left hip, stage 3 06/15/2021 No Yes I10 Essential (primary) hypertension 06/15/2021 No Yes M46.28 Osteomyelitis of vertebra, sacral and sacrococcygeal region 06/15/2021 No Yes Inactive Problems Resolved Problems Electronic Signature(s) Signed: 06/15/2021 10:49:49 AM By: Kalman Shan DO Entered By: Kalman Shan on 06/15/2021 10:40:20 -------------------------------------------------------------------------------- Progress Note Details Patient Name: Date of Service: Ivan [name], [name] 06/15/2021 9:00 A M Medical Record Number: [id number] Patient Account Number: [id number] Date of Birth/Sex: Treating RN: 1945-05-26 (76 y.o. Male) Ivan [name] Primary Care Provider: [name] Other Clinician: Referring Provider: Treating Provider/Extender: Yaakov Guthrie in Treatment: 0 Subjective Chief Complaint Information obtained from Patient 07/29/2019; patient is here for review of a fairly substantial wound over the left greater trochanter 07/04/2020; patient returns to clinic for follow-up of the original ulcer over the left greater trochanter 10/24/2020; patient returns to clinic for follow-up of his original ulcer over the left greater trochanter 06/15/2021; patient returns the clinic for original ulcer over the left greater trochanter History of Present Illness (HPI) ADMISSION 07/29/2019 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 He required admission to hospital from 1/5 through 1/9 with Covid pneumonia. At that point he apparently left AMA. He was noted to have a left hip bruise at the time of discharge. I suspect this really was a deep tissue injury. He was readmitted from 1/13 through 1//27 with altered mental status Covid pneumonia and delirium. At that point he had a left hip and thigh wound which had worsened. Felt to be a pressure injury. He was seen by general surgery who recommended normal saline wet-to-dry twice daily he did not have a debridement. After he left at that point he was incapacitated by increasing lumbar pain. An MRI on 2/9 showed discitis and osteomyelitis at L1/2. He asked aspiration of 1 cm fluid however I think this culture negative. He was also noted at this point to have a very large wound over the left greater trochanter. He was taken to the OR by Dr. Marla Roe for debridement with ACell placement. He was placed in a wound VAC. A culture at that point showed Enterococcus and Enterobacter. An MRI on 3/15 showed no evidence of left hip septic arthritis or osteomyelitis. Extensive subcutaneous edema within the visualized soft tissue  of the left thigh and prominent left inguinal lymph nodes likely reactive. He was started on IV cefepime and vancomycin for 6 weeks. He was discharged to [facility] facility as the patient. There I gather the wound developed the necrotic surface. He was seen by the wound care doctor in house who correctly pointed out that a wound VAC would not do anything with a necrotic surface. Since then he has been using Santyl The patient is now at home. He was on vancomycin and cefepime. He developed hives and a generalized rash and was seen in the ER on 3/15 by Dr. Cain Sieve recommendation he was changed to meropenem however he also apparently developed hives after this. He says the original hives stopped after the cefepime was  stopped and follow-up hives only developed after the meropenem. He is only taken 1 dose of the fear of the meropenem and now is on vancomycin. 3/25; the patient was seen by Dr. Linus Salmons on 3/22. Because of intolerances to meropenem which I communicated with Dr. De Burrs about he is now on daptomycin and Cipro until sometime in early April. He seems to be tolerating the antibiotics well. We are using Santyl back by wet-to-dry dressings. And making some improvement in the general surface of this very large wound 4/8; we are using Santyl back by wet-to-dry. We are making progress in terms of prepping the wound for a wound VAC. Still necrotic material requiring debridement. 4/16; we are using Santyl back by wet-to-dry. Not convinced that his wife is packing this properly there is a 6 cm tunnel at 12:00. We brought this to attention. Still requiring mechanical debridement 4/29; 2-week follow-up. In general the wound is better however there is considerable tunneling tunneling to the greater trochanter itself. There is still some debris in the deeper parts of this wound I did not do mechanical debridement on this today. I am going to change the dressing to quarter strength Dakin's wet-to-dry. We will put a wound VAC through his insurance. His wife has had brain damage from a previous meningioma surgery. She is not comfortable around strangers in her home. The patient is concerned about this 5/18; patient is using quarter strength Dakin's wet to dry. The problem is a deep pressure area over the left greater trochanter. There was some question from her intake nurses that this is actually come in and some question raised about whether he would require a wound VAC or not. The issue with the wound VAC was complicated predominantly by the patient's reluctance to allow people into his home i.e. home health although he tells me he has this mostly "sorted out" 7/1; this is a patient that I have not seen in almost 6 weeks.  Apparently canceled several appointments related to family problems deaths in the family etc. He is wife has ophthalmologic surgery next week. I had been trying to get a wound VAC ordered for this deep probing wound over the left greater trochanter but so far I think he is simply doing a Dakin's wet-to-dry and our intake nurse did not think there was any Dakin's on it. I have a note from Dr. Tommy Medal of infectious disease on 6/30. He has now been off doxycycline and ciprofloxacin for several weeks. He has no undue pain or drainage. He also had discitis and osteomyelitis at L1-L2 with a 1 cm left psoas abscess as well as some increased signal at T10/T11. He is not having any problems related to this. 12/16/2019 on evaluation today patient's wound  actually is showing some signs of bit of improvement which is good news. Fortunately there is no signs of active infection at this time which is good news. He tells me that the nurses have been coming to change this 3 times a week and doing a great job. He told the intake nurse that they had only been coming out once a week. I am not really sure what is going on with the discrepancy and stories here. Nonetheless I do believe that he needs to have this changed 3 times a week on a regular basis he does seem to have a little bit of a fungal infection around the wound. 8/19; 2-week follow-up. I have not seen this patient in over 6 weeks. There is been a dramatic improvement in the overall wound volume. Circumference of the wound is now small although there is still considerable undermining from roughly 9-2 o'clock maximum 1:00 at roughly 4.7 cm. There is no purulent drainage. He has some irritation around the wound on the normal skin. They are using miconazole on this. 9/2; 2-week follow-up. Dimensions of the wound circumference look quite good. There is tunneling at 4.3 cm at 1:00 versus 4.7 last time. The area continues to look like it is contracting with the wound  VAC and I think the treatment of choice here will be continuing to the wound VAC as long as it seems practical. There is no evidence of infection. As far as I am aware he is offloading this aggressively 9/16; 2-week follow-up. Left greater trochanter. Dimensions about the same still about 4.9 cm of maximal undermining. Nevertheless the visible tissue that I can see looks healthy. We have a wound VAC and really from the perspective of how this looks initially this is gotten a lot better however not much change in the last week 9/30 2-week follow-up. Left greater trochanter. He has a tunnel at 12:00 that goes about 7 cm. From 10-12 lesser degrees of undermining. HOWEVER he comes in today telling us that he is going to have to go back to work. He will not be able to work with a wound VAC and he wants that discontinued. There was not much discussion. This is a financial issue for him. He also has erythema around the wound orifice which has satellite lesions around it this is no doubt cutaneous candidiasis 10/12; 2-week follow-up. Left greater trochanter. 7.2 cm of tunneling. 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 He has not been systemically unwell. 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 He is using silver alginate which he is dressing himself, ABDs with a large plastic occlusive dressing. Dimensions are somewhat better although this is an irregular wound. When he was here the last time he had a lot of drainage coming out of this wound. The culture I did showed methicillin sensitive staph aureus. I gave him 10 days of doxycycline and he is just finishing up on this. He states the drainage is a lot better 04/03/2020 upon evaluation today patient appears to be doing well with regard to his wound as far as the overall appearance is concerned the one issue  that he is noting is that he does have some green drainage occurring at times. Obviously this brings up the concern for Pseudomonas I will see anything visually right now that has me overly  concerned about at the same time that history does concern me. The patient would like to see if we can attempt to treat this today. I think that being that green drainage is pretty much pathognomonic for a Pseudomonas infection I think he can continue to doxycycline but probably should do a couple weeks of Cipro to try to see if that clears up the drainage if not then we may need to delve into a more extensive culture possibly PCR at that point. With regard to the wound itself also believe based on talking with the patient today that he could be a candidate for a flap surgery that something he would like to consider READMISSION 07/04/2020 Mr. Selsor is a 76 year old man we had in clinic until the end of November 2021. He had a substantial wound on the left greater trochanter that required an original IandD by Dr. Marla Roe early in 2021. This was an infected wound and had IV antibiotics directed by infectious disease. He also had an L1-L2 discitis. We may have him in clinic here we initially put him in a wound VAC and he actually did quite well with this with filling in of the underlying tissue however he had to go back to work. 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 He is still working there 40 hours a week second shift. He was coming to this clinic until late in November I am not exactly sure what happened. I had not referred him to plastic surgery because he would have to inevitably take time off work that he was not prepared to do. The orifice of the wound was too small to really reconsider a wound VAC and that still true. He arrives back in clinic with a direct depth of the wound at 1.5 cm that tunnel goes to 3.1 cm from 3.6 last time. He saw Dr. Johnnye Sima on 04/14/2020. He ordered a  follow-up MRI. This showed persistence of a peritrochanteric fluid collection in the bursa which was slightly larger than the previous image. This communicated with the wounds. The patient still says he is having clear fluid drainage and he is changing his dressing twice a day. He is currently using predominantly half-strength Dakin's wet-to-dry and he correctly notes that the wound is clean and has come in significantly albeit slowly. He has not noticed any systemic symptoms. No real pain. Last measurements of his C-reactive protein and sedimentation rate were 3.7 and 2 respectively. He was supposed to follow-up with Dr. Johnnye Sima I am not sure that he has. Dr. Johnnye Sima had tried to organize an appointment with Dr. Marla Roe I do not think that ever got done either. The patient tells me he is not willing to consider any further surgery to this area until he has short-term disability time which he thinks will happen sometime in May or June 3/22; monthly follow-up. Minimal change in the important measurements of depth at 1.7 and the 12:00 tunnel at 2.6. We have been referred back to Dr. Marla Roe I think by Dr. Johnnye Sima although he does not yet have a plastics appointment 10/24/2020; patient dropped off our schedule when he returns to clinic with for review of the same wound over the left greater trochanter. He says the reason is because of medical issues including TUR of a benign bladder tumor. He also had cataract issues. In any case he has been using a hydrogel wet-to-dry to his left hip wound. He is working full-time this does not limit  him. Direct depth today measured at 2.1 with undermining from 8-12 also with 2.1. This represents some improvement. He tells me he never did see Dr. Marla Roe again because he could not coordinate another appointment. He is not really wanting to do the flap surgery type of thing he says this does not inhibit his lifestyle 7/14; 1 month follow-up he is using hydrogel  wet-to-dry. The wound has 2 cm of direct depth and a 2.5 cm tunnel at 1:00 I am not sure that this has changed too much. Once again I have talked to him about whether he wants to try a different dressing Hydrofera Blue packing rope, polymen Ag rope etc. He does not seem at all interested in this. Wants to do the hydrogel wet-to-dry packing for another month 8/25; 1 month follow-up using hydrogel wet-to-dry. I think he is actually done better. Wound is come in since the last time he was here a month ago. He reports no other issues 9/22; he is using hydrogel wet-to-dry predominantly however recently he changed to Dakin's wet-to-dry because he perceived an odor. Not much change in dimensions per our intake nurses. He has no systemic symptoms. 2/30/2023 Mr. Icarus Partch has followed in the clinic over several years for a wound to his left hip. This wound has never closed. He was last seen on 02/01/2021 and was using hydrogel wet-to-dry dressings. He currently has no issues or complaints today. He denies signs of infection. Patient History Information obtained from Patient. Allergies Iodinated Contrast Media, cefepime, meropenem, vancomycin (Reaction: rash), lisinopril (Reaction: facial swelling) Family History Cancer - Paternal Grandparents, Diabetes - Paternal Grandparents, Kidney Disease - Maternal Grandparents, No family history of Heart Disease, Hereditary Spherocytosis, Hypertension, Lung Disease, Seizures, Stroke, Thyroid Problems, Tuberculosis. Social History Former smoker - quit 5 years ago, Marital Status - Married, Alcohol Use - Rarely, Drug Use - No History, Caffeine Use - Daily. Medical History Eyes Patient has history of Cataracts Denies history of Glaucoma, Optic Neuritis Ear/Nose/Mouth/Throat Denies history of Chronic sinus problems/congestion, Middle ear problems Hematologic/Lymphatic Denies history of Anemia, Hemophilia, Human Immunodeficiency Virus, Lymphedema, Sickle Cell  Disease Respiratory Denies history of Aspiration, Asthma, Chronic Obstructive Pulmonary Disease (COPD), Pneumothorax, Sleep Apnea, Tuberculosis Cardiovascular Patient has history of Coronary Artery Disease, Hypertension, Myocardial Infarction Gastrointestinal Denies history of Cirrhosis , Colitis, Crohnoos, Hepatitis A, Hepatitis B, Hepatitis C Endocrine Denies history of Type I Diabetes, Type II Diabetes Genitourinary Denies history of End Stage Renal Disease Immunological Denies history of Lupus Erythematosus, Raynaudoos, Scleroderma Integumentary (Skin) Denies history of History of Burn Musculoskeletal Patient has history of Osteoarthritis, Osteomyelitis Denies history of Gout, Rheumatoid Arthritis Neurologic Denies history of Dementia, Neuropathy, Quadriplegia, Paraplegia, Seizure Disorder Oncologic Denies history of Received Chemotherapy, Received Radiation Psychiatric Denies history of Anorexia/bulimia, Confinement Anxiety Hospitalization/Surgery History - appendectomy. - cardiac catheterization. - cataract extraction. - cervical fusion. - CABG. - TURP. - cataract laser procedure. Medical A Surgical History Notes nd Endocrine testosterone deficiency Genitourinary BPH Oncologic skin cancer Review of Systems (ROS) Integumentary (Skin) Complains or has symptoms of Wounds - Left Hip. Objective Constitutional respirations regular, non-labored and within target range for patient.. Vitals Time Taken: 9:15 AM, Temperature: 98.6 F, Pulse: 66 bpm, Respiratory Rate: 16 breaths/min, Blood Pressure: 142/90 mmHg. Psychiatric pleasant and cooperative. General Notes: Left hip: Over the bony prominence there is an open wound with granulation tissue that does not appear healthy. The wound tunnels at the 1 o'clock position. No obvious signs of infection. Integumentary (Hair, Skin) Wound #4 status is  Open. Original cause of wound was Pressure Injury. The date acquired was:  05/31/2019. The wound is located on the Left Trochanter. The wound measures 1cm length x 0.3cm width x 0.9cm depth; 0.236cm^2 area and 0.212cm^3 volume. There is Fat Layer (Subcutaneous Tissue) exposed. There is no undermining noted, however, there is tunneling at 11:00 with a maximum distance of 0.6cm. There is a medium amount of serosanguineous drainage noted. The wound margin is well defined and not attached to the wound base. There is large (67-100%) red, pink granulation within the wound bed. There is a small (1-33%) amount of necrotic tissue within the wound bed including Adherent Slough. General Notes: Slight maceration Assessment Active Problems ICD-10 Pressure ulcer of left hip, stage 3 Essential (primary) hypertension Osteomyelitis of vertebra, sacral and sacrococcygeal region Patient presents with a 2-year history of nonhealing ulcer to the left hip. Patient had COVID and was hospitalized in 05/18/2019. After being treated he left AGAINST MEDICAL ADVICE and returned due to having severe back pain and laying on the floor for 4 days. He developed the left hip pressure wound at that time. He has had debridement in the OR by Dr. Marla Roe for the hip wound with ACell and VAC placement on 07/01/2019. He had an MRI of the left hip on 04/11/2020 that showed left peritrochanteric bursal fluid with no evidence of osteomyelitis. At this time I recommended Hydrofera Blue dressings with hydrogel, aggressive offloading and a left hip x-ray. There are no signs of infection on exam. If this wound does not heal with conservative wound dressings he may need to follow-up with Dr. Marla Roe for discussion of OR debridement and skin substitute versus skin grafting 46 minutes spent on the encounter including face-to-face, EMR review and coordination of care Procedures Wound #4 Pre-procedure diagnosis of Wound #4 is a Pressure Ulcer located on the Left Trochanter . There was a Chemical/Enzymatic/Mechanical  debridement performed by Kalman Shan, DO.. Other agent used was Gauze. A time out was conducted at 09:55, prior to the start of the procedure. There was no bleeding. The procedure was tolerated well. Post Debridement Measurements: 1cm length x 0.3cm width x 0.9cm depth; 0.212cm^3 volume. Post debridement Stage noted as Category/Stage III. Character of Wound/Ulcer Post Debridement is stable. Post procedure Diagnosis Wound #4: Same as Pre-Procedure Plan Follow-up Appointments: Return Appointment in 2 weeks. - with Dr. Heber [place] Bathing/ Shower/ Hygiene: May shower and wash wound with soap and water. Off-Loading: Other: - Avoid prolonged pressure to area. Additional Orders / Instructions: Follow Nutritious Diet Radiology ordered were: X-ray, other: Left Hip - Xray of left hip, chronic non-healing wound WOUND #4: - Trochanter Wound Laterality: Left Cleanser: Soap and Water 1 x Per Day/30 Days Discharge Instructions: May shower and wash wound with dial antibacterial soap and water prior to dressing change. Cleanser: Wound Cleanser 1 x Per Day/30 Days Discharge Instructions: Cleanse the wound with wound cleanser prior to applying a clean dressing using gauze sponges, not tissue or cotton balls. Peri-Wound Care: Skin Prep 1 x Per Day/30 Days Discharge Instructions: Use skin prep as directed Topical: Skintegrity Hydrogel 4 (oz) 1 x Per Day/30 Days Discharge Instructions: Patient to use Solosite Prim Dressing: Hydrofera Blue Rope 1 x Per Day/30 Days ary Secondary Dressing: Zetuvit Plus Silicone Border Dressing 4x4 (in/in) 1 x Per Day/30 Days Discharge Instructions: Apply silicone border over primary dressing as directed. 1. Hydrofera Blue 2. Aggressive offloading 3. Left hip x-ray Electronic Signature(s) Signed: 06/15/2021 10:49:49 AM By: Kalman Shan DO Entered By: Heber Kyle,  Aydrien Froman on 06/15/2021  10:49:16 -------------------------------------------------------------------------------- HxROS Details Patient Name: Date of Service: Ivan [name]. 06/15/2021 9:00 A M Medical Record Number: [id number] Patient Account Number: [id number] Date of Birth/Sex: Treating RN: [birth year] (76 y.o. Male) [name] Primary Care Provider: [name] Other Clinician: Referring Provider: Treating Provider/Extender: Yaakov Guthrie in Treatment: 0 Information Obtained From Patient Integumentary (Skin) Complaints and Symptoms: Positive for: Wounds - Left Hip Medical History: Negative for: History of Burn Eyes Medical History: Positive for: Cataracts Negative for: Glaucoma; Optic Neuritis Ear/Nose/Mouth/Throat Medical History: Negative for: Chronic sinus problems/congestion; Middle ear problems Hematologic/Lymphatic Medical History: Negative for: Anemia; Hemophilia; Human Immunodeficiency Virus; Lymphedema; Sickle Cell Disease Respiratory Medical History: Negative for: Aspiration; Asthma; Chronic Obstructive Pulmonary Disease (COPD); Pneumothorax; Sleep Apnea; Tuberculosis Cardiovascular Medical History: Positive for: Coronary Artery Disease; Hypertension; Myocardial Infarction Gastrointestinal Medical History: Negative for: Cirrhosis ; Colitis; Crohns; Hepatitis A; Hepatitis B; Hepatitis C Endocrine Medical History: Negative for: Type I Diabetes; Type II Diabetes Past Medical History Notes: testosterone deficiency Genitourinary Medical History: Negative for: End Stage Renal Disease Past Medical History Notes: BPH Immunological Medical History: Negative for: Lupus Erythematosus; Raynauds; Scleroderma Musculoskeletal Medical History: Positive for: Osteoarthritis; Osteomyelitis Negative for: Gout; Rheumatoid Arthritis Neurologic Medical History: Negative for: Dementia; Neuropathy; Quadriplegia; Paraplegia; Seizure Disorder Oncologic Medical  History: Negative for: Received Chemotherapy; Received Radiation Past Medical History Notes: skin cancer Psychiatric Medical History: Negative for: Anorexia/bulimia; Confinement Anxiety HBO Extended History Items Eyes: Cataracts Immunizations Pneumococcal Vaccine: Received Pneumococcal Vaccination: Yes Received Pneumococcal Vaccination On or After 60th Birthday: Yes Implantable Devices None Hospitalization / Surgery History Type of Hospitalization/Surgery appendectomy cardiac catheterization cataract extraction cervical fusion CABG TURP cataract laser procedure Family and Social History Cancer: Yes - Paternal Grandparents; Diabetes: Yes - Paternal Grandparents; Heart Disease: No; Hereditary Spherocytosis: No; Hypertension: No; Kidney Disease: Yes - Maternal Grandparents; Lung Disease: No; Seizures: No; Stroke: No; Thyroid Problems: No; Tuberculosis: No; Former smoker - quit 5 years ago; Marital Status - Married; Alcohol Use: Rarely; Drug Use: No History; Caffeine Use: Daily; Financial Concerns: No; Food, Clothing or Shelter Needs: No; Support System Lacking: No; Transportation Concerns: No Electronic Signature(s) Signed: 06/15/2021 10:49:49 AM By: Kalman Shan DO Signed: 06/15/2021 1:32:11 PM By: [name] Entered By: [name] on 06/15/2021 09:17:38 -------------------------------------------------------------------------------- [place] Details Patient Name: Date of Service: Ivan [name], [name] 06/15/2021 Medical Record Number: [id number] Patient Account Number: [id number] Date of Birth/Sex: Treating RN: Sep 07, 1945 (76 y.o. Male) [name] Primary Care Provider: [name] Other Clinician: Referring Provider: Treating Provider/Extender: Yaakov Guthrie in Treatment: 0 Diagnosis Coding ICD-10 Codes Code Description [phone] Pressure ulcer of left hip, stage 3 I10 Essential (primary) hypertension M46.28 Osteomyelitis of vertebra, sacral and  sacrococcygeal region Facility Procedures CPT4 Code: [id number] Description: [phone] - WOUND CARE VISIT-LEV 2 EST PT Modifier: 25 Quantity: 1 CPT4 Code: [id number] Description: 87564 - DEBRIDE W/O ANES NON SELECT ICD-10 Diagnosis Description L89.223 Pressure ulcer of left hip, stage 3 Modifier: Quantity: 1 Physician Procedures : CPT4 Code Description Modifier [id number] - WC PHYS LEVEL 4 - EST PT ICD-10 Diagnosis Description L89.223 Pressure ulcer of left hip, stage 3 I10 Essential (primary) hypertension M46.28 Osteomyelitis of vertebra, sacral and sacrococcygeal region Quantity: 1 Electronic Signature(s) Signed: 06/15/2021 10:49:49 AM By: Kalman Shan DO Entered By: Kalman Shan on 06/15/2021 10:49:23

## 2021-06-15 NOTE — Progress Notes (Signed)
Ivan, [name] ([id number]) Visit Report for 06/15/2021 Abuse Risk Screen Details Patient Name: Date of Service: Ivan [name]. 06/15/2021 9:00 A M Medical Record Number: [id number] Patient Account Number: [id number] Date of Birth/Sex: Treating RN: [birth year] (76 y.o. Male) Ivan [name] Primary Care Joash Tony: Ivan [name] Other Clinician: Referring Lawrie Tunks: Treating Laikyn Gewirtz/Extender: Yaakov Guthrie in Treatment: 0 Abuse Risk Screen Items Answer ABUSE RISK SCREEN: Has anyone close to you tried to hurt or harm you recentlyo No Do you feel uncomfortable with anyone in your familyo No Has anyone forced you do things that you didnt want to doo No Electronic Signature(s) Signed: 06/15/2021 1:32:11 PM By: Ivan [name] Entered By: Ivan [name] on 06/15/2021 09:17:45 -------------------------------------------------------------------------------- Activities of Daily Living Details Patient Name: Date of Service: Ivan [name]. 06/15/2021 9:00 A M Medical Record Number: [id number] Patient Account Number: [id number] Date of Birth/Sex: Treating RN: 1945/09/12 (76 y.o. Male) Ivan [name] Primary Care Ivan [name]: Ivan [name] Other Clinician: Referring Ivan [name]: Treating Ivan [name]/Extender: Yaakov Guthrie in Treatment: 0 Activities of Daily Living Items Answer Activities of Daily Living (Please select one for each item) Drive Automobile Completely Able T Medications ake Completely Able Use T elephone Completely Able Care for Appearance Completely Able Use T oilet Completely Able Bath / Shower Completely Able Dress Self Completely Able Feed Self Completely Able Walk Completely Able Get In / Out Bed Completely Able Housework Completely Able Prepare Meals Completely [place] for Self Completely Able Electronic Signature(s) Signed: 06/15/2021 1:32:11 PM By: Ivan [name] Entered By: Ivan [name] on 06/15/2021  09:18:05 -------------------------------------------------------------------------------- Education Screening Details Patient Name: Date of Service: Ivan [name], [name] 06/15/2021 9:00 A M Medical Record Number: [id number] Patient Account Number: [id number] Date of Birth/Sex: Treating RN: 08-13-1945 (76 y.o. Male) Ivan [name] Primary Care Ivan [name]: Ivan [name] Other Clinician: Referring Ivan [name]: Treating Ivan [name]/Extender: Yaakov Guthrie in Treatment: 0 Primary Learner Assessed: Patient Learning Preferences/Education Level/Primary Language Learning Preference: Explanation, Demonstration, Printed Material Highest Education Level: College or Above Preferred Language: English Cognitive Barrier Language Barrier: No Translator Needed: No Memory Deficit: No Emotional Barrier: No Cultural/Religious Beliefs Affecting Medical Care: No Physical Barrier Impaired Vision: No Impaired Hearing: No Decreased Hand dexterity: No Knowledge/Comprehension Knowledge Level: High Comprehension Level: High Ability to understand written instructions: High Ability to understand verbal instructions: High Motivation Anxiety Level: Calm Cooperation: Cooperative Education Importance: Acknowledges Need Interest in Health Problems: Asks Questions Perception: Coherent Willingness to Engage in Self-Management High Activities: Readiness to Engage in Self-Management High Activities: Electronic Signature(s) Signed: 06/15/2021 1:32:11 PM By: Ivan [name] Entered By: Ivan [name] on 06/15/2021 09:18:51 -------------------------------------------------------------------------------- Fall Risk Assessment Details Patient Name: Date of Service: Ivan [name], [name] 06/15/2021 9:00 [place] Record Number: [id number] Patient Account Number: [id number] Date of Birth/Sex: Treating RN: May 30, 1945 (76 y.o. Male) Ivan [name] Primary Care Sparrow [place]: Ivan [name] Other Clinician: Referring  Ivan [name]: Treating Ivan [name]/Extender: Yaakov Guthrie in Treatment: 0 Fall Risk Assessment Items Have you had 2 or more falls in the last 12 monthso 0 No Have you had any fall that resulted in injury in the last 12 monthso 0 No FALLS RISK SCREEN History of falling - immediate or within 3 months 0 No Secondary diagnosis (Do you have 2 or more medical diagnoseso) 0 No Ambulatory aid None/bed rest/wheelchair/nurse 0 Yes Crutches/cane/walker 0 No Furniture 0 No Intravenous therapy Access/Saline/Heparin Lock 0 No Gait/Transferring Normal/ bed rest/ wheelchair 0 Yes Weak (short steps with or without shuffle, stooped but  able to lift head while walking, may seek 0 No support from furniture) Impaired (short steps with shuffle, may have difficulty arising from chair, head down, impaired 0 No balance) Mental Status Oriented to own ability 0 Yes Electronic Signature(s) Signed: 06/15/2021 1:32:11 PM By: Ivan [name] Entered By: Ivan [name] on 06/15/2021 09:19:06 -------------------------------------------------------------------------------- Foot Assessment Details Patient Name: Date of Service: Ivan [name], [name] 06/15/2021 9:00 A M Medical Record Number: [id number] Patient Account Number: [id number] Date of Birth/Sex: Treating RN: Oct 05, 1945 (76 y.o. Male) Ivan [name] Primary Care Tzippy Testerman: Ivan [name] Other Clinician: Referring Ivan [name]: Treating Ivan [name]/Extender: Yaakov Guthrie in Treatment: 0 Foot Assessment Items Site Locations + = Sensation present, - = Sensation absent, C = Callus, U = Ulcer R = Redness, W = Warmth, M = Maceration, PU = Pre-ulcerative lesion F = Fissure, S = Swelling, D = Dryness Assessment Right: Left: Other Deformity: No No Prior Foot Ulcer: No No Prior Amputation: No No Charcot Joint: No No Ambulatory Status: Ambulatory Without Help Gait: Steady Notes N/A- Hip Wound Electronic Signature(s) Signed: 06/15/2021 1:32:11 PM By:  Ivan [name] Entered By: Ivan [name] on 06/15/2021 09:19:38 -------------------------------------------------------------------------------- Nutrition Risk Screening Details Patient Name: Date of Service: Ivan [name], [name] 06/15/2021 9:00 A M Medical Record Number: [id number] Patient Account Number: [id number] Date of Birth/Sex: Treating RN: 06-10-1945 (75 y.o. Male) Ivan [name] Primary Care Jumana Paccione: Ivan [name] Other Clinician: Referring Copeland Lapier: Treating Marino Rogerson/Extender: Yaakov Guthrie in Treatment: 0 Height (in): Weight (lbs): Body Mass Index (BMI): Nutrition Risk Screening Items Score Screening NUTRITION RISK SCREEN: I have an illness or condition that made me change the kind and/or amount of food I eat 0 No I eat fewer than two meals per day 0 No I eat few fruits and vegetables, or milk products 0 No I have three or more drinks of beer, liquor or wine almost every day 0 No I have tooth or mouth problems that make it hard for me to eat 0 No I don't always have enough money to buy the food I need 0 No I eat alone most of the time 0 No I take three or more different prescribed or over-the-counter drugs a day 1 Yes Without wanting to, I have lost or gained 10 pounds in the last six months 0 No I am not always physically able to shop, cook and/or feed myself 0 No Nutrition Protocols Good Risk Protocol 0 No interventions needed Moderate Risk Protocol High Risk Proctocol Risk Level: Good Risk Score: 1 Electronic Signature(s) Signed: 06/15/2021 1:32:11 PM By: Ivan [name] Entered By: Ivan [name] on 06/15/2021 09:19:18

## 2021-06-18 NOTE — Progress Notes (Signed)
Ivan [name] ([id number]) Visit Report for 06/15/2021 Allergy List Details Patient Name: Date of Service: Ivan [name]. 06/15/2021 9:00 A M Medical Record Number: [id number] Patient Account Number: [id number] Date of Birth/Sex: Treating RN: [birth year] (75 y.o. Male) Ivan [name] Primary Care Ivan [name]: Ivan [name] Other Clinician: Referring Ivan [name]: Treating Ivan [name]/Extender: Ivan [name] in Treatment: 0 Allergies Active Allergies Iodinated Contrast Media cefepime meropenem vancomycin Reaction: rash lisinopril Reaction: facial swelling Allergy Notes Electronic Signature(s) Signed: 06/15/2021 1:32:11 PM By: Ivan [name] Entered By: Ivan [name] on 06/15/2021 09:12:14 -------------------------------------------------------------------------------- Arrival Information Details Patient Name: Date of Service: Ivan [name], Ivan BERT W. 06/15/2021 9:00 A M Medical Record Number: [id number] Patient Account Number: [id number] Date of Birth/Sex: Treating RN: 12/15/1945 (75 y.o. Male) Ivan [name] Primary Care Ivan [name]: Ivan [name] Other Clinician: Referring Ivan [name]: Treating Ivan [name]/Extender: Ivan [name] in Treatment: 0 Visit Information Patient Arrived: Ambulatory Arrival Time: 09:14 Transfer Assistance: None Patient Identification Verified: Yes Secondary Verification Process Completed: Yes Patient Requires Transmission-Based Precautions: No Patient Has Alerts: Yes Patient Alerts: Patient on Blood Thinner History Since Last Visit Added or deleted any medications: No Any new allergies or adverse reactions: No Had a fall or experienced change in activities of daily living that may affect risk of falls: No Signs or symptoms of abuse/neglect since last visito No Hospitalized since last visit: No Implantable device outside of the clinic excluding cellular tissue based products placed in the center since last visit: No Has Dressing in Place as  Prescribed: Yes Electronic Signature(s) Signed: 06/15/2021 1:32:11 PM By: Ivan [name] Entered By: Ivan [name] on 06/15/2021 09:15:37 -------------------------------------------------------------------------------- Clinic Level of Care Assessment Details Patient Name: Date of Service: Ivan [name], Ivan BERT W. 06/15/2021 9:00 [place] Record Number: [id number] Patient Account Number: [id number] Date of Birth/Sex: Treating RN: January 16, 1946 (75 y.o. Male) Ivan [name] Primary Care Ivan [name]: Ivan [name] Other Clinician: Referring Ivan [name]: Treating Ivan [name]/Extender: Ivan [name] in Treatment: 0 Clinic Level of Care Assessment Items TOOL 1 Quantity Score X- 1 0 Use when EandM and Procedure is performed on INITIAL visit ASSESSMENTS - Nursing Assessment / Reassessment X- 1 20 General Physical Exam (combine w/ comprehensive assessment (listed just below) when performed on new pt. evals) X- 1 25 Comprehensive Assessment (HX, ROS, Risk Assessments, Wounds Hx, etc.) ASSESSMENTS - Wound and Skin Assessment / Reassessment []  - 0 Dermatologic / Skin Assessment (not related to wound area) ASSESSMENTS - Ostomy and/or Continence Assessment and Care []  - 0 Incontinence Assessment and Management []  - 0 Ostomy Care Assessment and Management (repouching, etc.) PROCESS - Coordination of Care []  - 0 Simple Patient / Family Education for ongoing care X- 1 20 Complex (extensive) Patient / Family Education for ongoing care X- 1 10 Staff obtains Programmer, systems, Records, T Results / Process Orders est []  - 0 Staff telephones HHA, Nursing Homes / Clarify orders / etc []  - 0 Routine Transfer to another Facility (non-emergent condition) []  - 0 Routine Hospital Admission (non-emergent condition) []  - 0 New Admissions / Biomedical engineer / Ordering NPWT Apligraf, etc. , []  - 0 Emergency Hospital Admission (emergent condition) PROCESS - Special Needs []  - 0 Pediatric / Minor Patient  Management []  - 0 Isolation Patient Management []  - 0 Hearing / Language / Visual special needs []  - 0 Assessment of Community assistance (transportation, D/C planning, etc.) []  - 0 Additional assistance / Altered mentation []  - 0 Support Surface(s) Assessment (bed, cushion, seat, etc.) INTERVENTIONS - Miscellaneous []  - 0 External ear exam []  -  0 Patient Transfer (multiple staff / Civil Service fast streamer / Similar devices) []  - 0 Simple Staple / Suture removal (25 or less) []  - 0 Complex Staple / Suture removal (26 or more) []  - 0 Hypo/Hyperglycemic Management (do not check if billed separately) []  - 0 Ankle / Brachial Index (ABI) - do not check if billed separately Has the patient been seen at the hospital within the last three years: Yes Total Score: 75 Level Of Care: New/Established - Level 2 Electronic Signature(s) Signed: 06/15/2021 1:32:11 PM By: Ivan [name] Entered By: Ivan [name] on 06/15/2021 10:11:52 -------------------------------------------------------------------------------- Lower Extremity Assessment Details Patient Name: Date of Service: Ivan [name], Ivan BERT W. 06/15/2021 9:00 A M Medical Record Number: [id number] Patient Account Number: [id number] Date of Birth/Sex: Treating RN: Oct 07, 1945 (75 y.o. Male) Ivan [name] Primary Care Ivan [name]: Ivan [name] Other Clinician: Referring Ivan [name]: Treating Chimamanda Siegfried/Extender: Ivan [name] in Treatment: 0 Electronic Signature(s) Signed: 06/15/2021 1:32:11 PM By: Ivan [name] Entered By: Ivan [name] on 06/15/2021 09:19:45 -------------------------------------------------------------------------------- Multi Wound Chart Details Patient Name: Date of Service: Ivan [name], Ivan BERT W. 06/15/2021 9:00 A M Medical Record Number: [id number] Patient Account Number: [id number] Date of Birth/Sex: Treating RN: 1946-04-06 (75 y.o. Male) Baruch Gouty Primary Care Nayeliz Hipp: Ivan [name] Other Clinician: Referring  Ivan [name]: Treating Ivan [name]/Extender: Ivan [name] in Treatment: 0 Vital Signs Height(in): Pulse(bpm): 74 Weight(lbs): Blood Pressure(mmHg): 142/90 Body Mass Index(BMI): Temperature(F): 98.6 Respiratory Rate(breaths/min): 16 Photos: [N/A:N/A] Left Trochanter N/A N/A Wound Location: Pressure Injury N/A N/A Wounding Event: Pressure Ulcer N/A N/A Primary Etiology: Cataracts, Coronary Artery Disease, N/A N/A Comorbid History: Hypertension, Myocardial Infarction, Osteoarthritis, Osteomyelitis 05/31/2019 N/A N/A Date Acquired: 0 N/A N/A [name] of Treatment: Open N/A N/A Wound Status: No N/A N/A Wound Recurrence: 1x0.3x0.9 N/A N/A Measurements L x W x D (cm) 0.236 N/A N/A A (cm) : rea 0.212 N/A N/A Volume (cm) : 0.00% N/A N/A % Reduction in A rea: 0.00% N/A N/A % Reduction in Volume: 11 Position 1 (o'clock): 0.6 Maximum Distance 1 (cm): Yes N/A N/A Tunneling: Category/Stage III N/A N/A Classification: Medium N/A N/A Exudate A mount: Serosanguineous N/A N/A Exudate Type: red, brown N/A N/A Exudate Color: Well defined, not attached N/A N/A Wound Margin: Large (67-100%) N/A N/A Granulation A mount: Red, Pink N/A N/A Granulation Quality: Small (1-33%) N/A N/A Necrotic A mount: Fat Layer (Subcutaneous Tissue): Yes N/A N/A Exposed Structures: Fascia: No Tendon: No Muscle: No Joint: No Bone: No None N/A N/A Epithelialization: Chemical/Enzymatic/Mechanical N/A N/A Debridement: Pre-procedure Verification/Time Out 09:55 N/A N/A Taken: N/A N/A N/A Instrument: None N/A N/A Bleeding: Procedure was tolerated well N/A N/A Debridement Treatment Response: 1x0.3x0.9 N/A N/A Post Debridement Measurements L x W x D (cm) 0.212 N/A N/A Post Debridement Volume: (cm) Category/Stage III N/A N/A Post Debridement Stage: Slight maceration N/A N/A Assessment Notes: Debridement N/A N/A Procedures Performed: Treatment Notes Electronic  Signature(s) Signed: 06/15/2021 10:49:49 AM By: Ivan Shan DO Signed: 06/18/2021 4:37:55 PM By: Baruch Gouty RN, BSN Entered By: Ivan Shan on 06/15/2021 10:40:27 -------------------------------------------------------------------------------- [place] Details Patient Name: Date of Service: Ivan [name], Ivan BERT W. 06/15/2021 9:00 A M Medical Record Number: [id number] Patient Account Number: [id number] Date of Birth/Sex: Treating RN: 02-05-1946 (75 y.o. Male) Ivan [name] Primary Care Cariah Salatino: Ivan [name] Other Clinician: Referring Kristelle Cavallaro: Treating Cuong Moorman/Extender: Ivan [name] in Treatment: 0 Active Inactive Wound/Skin Impairment Nursing Diagnoses: Impaired tissue integrity Goals: Patient/caregiver will verbalize understanding of skin care regimen Date Initiated: 06/15/2021 Target Resolution Date: 07/13/2021 Goal Status: Active Ulcer/skin breakdown  will have a volume reduction of 30% by week 4 Date Initiated: 06/15/2021 Target Resolution Date: 07/13/2021 Goal Status: Active Interventions: Assess patient/caregiver ability to obtain necessary supplies Assess patient/caregiver ability to perform ulcer/skin care regimen upon admission and as needed Assess ulceration(s) every visit Provide education on ulcer and skin care Treatment Activities: Topical wound management initiated : 06/15/2021 Notes: Electronic Signature(s) Signed: 06/15/2021 9:31:52 AM By: Ivan [name] Entered By: Ivan [name] on 06/15/2021 09:31:51 -------------------------------------------------------------------------------- Pain Assessment Details Patient Name: Date of Service: Ivan [name], Ivan BERT W. 06/15/2021 9:00 A M Medical Record Number: [id number] Patient Account Number: [id number] Date of Birth/Sex: Treating RN: 02/04/1946 (75 y.o. Male) Ivan [name] Primary Care Glennie Rodda: Ivan [name] Other Clinician: Referring Janna Oak: Treating Harmani Neto/Extender: Ivan [name] in Treatment: 0 Active Problems Location of Pain Severity and Description of Pain Patient Has Paino No Site Locations Pain Management and Medication Current Pain Management: Electronic Signature(s) Signed: 06/15/2021 1:32:11 PM By: Ivan [name] Entered By: Ivan [name] on 06/15/2021 09:19:56 -------------------------------------------------------------------------------- Patient/Caregiver Education Details Patient Name: Date of Service: [place], Ivan BERT W. 2/3/2023andnbsp9:00 A M Medical Record Number: [id number] Patient Account Number: [id number] Date of Birth/Gender: Treating RN: 01/07/1946 (75 y.o. Male) Ivan [name] Primary Care Physician: Ivan [name] Other Clinician: Referring Physician: Treating Physician/Extender: Ivan [name] in Treatment: 0 Education Assessment Education Provided To: Patient Education Topics Provided Wound/Skin Impairment: Methods: Demonstration, Explain/Verbal, Printed Responses: State content correctly Electronic Signature(s) Signed: 06/15/2021 1:32:11 PM By: Ivan [name] Entered By: Ivan [name] on 06/15/2021 09:32:10 -------------------------------------------------------------------------------- Wound Assessment Details Patient Name: Date of Service: Ivan [name], Ivan BERT W. 06/15/2021 9:00 A M Medical Record Number: [id number] Patient Account Number: [id number] Date of Birth/Sex: Treating RN: [birth year] (75 y.o. Male) Ivan [name] Primary Care Maisen Klingler: Ivan [name] Other Clinician: Referring Natalie Leclaire: Treating Katherene Dinino/Extender: Ivan [name] in Treatment: 0 Wound Status Wound Number: 4 Primary Pressure Ulcer Etiology: Wound Location: Left Trochanter Wound Open Wounding Event: Pressure Injury Status: Date Acquired: 05/31/2019 Comorbid Cataracts, Coronary Artery Disease, Hypertension, Myocardial [name] Of Treatment: 0 History: Infarction, Osteoarthritis, Osteomyelitis Clustered Wound:  No Photos Wound Measurements Length: (cm) 1 Width: (cm) 0.3 Depth: (cm) 0.9 Area: (cm) 0.236 Volume: (cm) 0.212 % Reduction in Area: 0% % Reduction in Volume: 0% Epithelialization: None Tunneling: Yes Position (o'clock): 11 Maximum Distance: (cm) 0.6 Undermining: No Wound Description Classification: Category/Stage III Wound Margin: Well defined, not attached Exudate Amount: Medium Exudate Type: Serosanguineous Exudate Color: red, brown Wound Bed Granulation Amount: Large (67-100%) Granulation Quality: Red, Pink Necrotic Amount: Small (1-33%) Necrotic Quality: Adherent Slough Foul Odor After Cleansing: No Slough/Fibrino Yes Exposed Structure Fascia Exposed: No Fat Layer (Subcutaneous Tissue) Exposed: Yes Tendon Exposed: No Muscle Exposed: No Joint Exposed: No Bone Exposed: No Assessment Notes Slight maceration Electronic Signature(s) Signed: 06/15/2021 1:32:11 PM By: Ivan [name] Entered By: Ivan [name] on 06/15/2021 09:26:39 -------------------------------------------------------------------------------- Vitals Details Patient Name: Date of Service: Ivan [name], Ivan BERT W. 06/15/2021 9:00 A M Medical Record Number: [id number] Patient Account Number: [id number] Date of Birth/Sex: Treating RN: May 29, 1945 (75 y.o. Male) Ivan [name] Primary Care Belicia Difatta: Ivan [name] Other Clinician: Referring Shahed Yeoman: Treating Lyberti Thrush/Extender: Ivan [name] in Treatment: 0 Vital Signs Time Taken: 09:15 Temperature (F): 98.6 Pulse (bpm): 66 Respiratory Rate (breaths/min): 16 Blood Pressure (mmHg): 142/90 Reference Range: 80 - 120 mg / dl Electronic Signature(s) Signed: 06/15/2021 1:32:11 PM By: Ivan [name] Entered By: Ivan [name] on 06/15/2021 09:16:16

## 2021-06-20 ENCOUNTER — Telehealth: Payer: Self-pay

## 2021-06-20 NOTE — Telephone Encounter (Signed)
Tara from [place] present to [facility] clinic to discuss patient's wound care. Provided copy of notes to PCP. Asked that patient be scheduled for her next available appointment.   Called patient. Patient is currently at work and states that he will call back to schedule this appointment.   When patient returns call, please schedule with Dr. Owens Shark.   Talbot Grumbling, RN

## 2021-06-29 ENCOUNTER — Other Ambulatory Visit: Payer: Self-pay

## 2021-06-29 ENCOUNTER — Encounter (HOSPITAL_BASED_OUTPATIENT_CLINIC_OR_DEPARTMENT_OTHER): Payer: Medicare HMO | Admitting: Internal Medicine

## 2021-06-29 DIAGNOSIS — L89223 Pressure ulcer of left hip, stage 3: Secondary | ICD-10-CM

## 2021-06-29 DIAGNOSIS — M4628 Osteomyelitis of vertebra, sacral and sacrococcygeal region: Secondary | ICD-10-CM

## 2021-06-29 DIAGNOSIS — I10 Essential (primary) hypertension: Secondary | ICD-10-CM

## 2021-07-02 NOTE — Progress Notes (Signed)
Ivan [name], Ivan [name] ([id number]) Visit Report for 06/29/2021 Arrival Information Details Patient Name: Date of Service: Ivan [name]. 06/29/2021 10:00 Ivan [name] Medical Record Number: [id number] Patient Account Number: [id number] Date of Birth/Sex: Treating RN: 08-26-1945 (76 y.o. Ivan [name] Primary Care Ivan [name]: Ivan [name] Other Clinician: Referring Ivan [name]: Treating Ivan [name]/Extender: Ivan [name] in Treatment: 2 Visit Information History Since Last Visit Added or deleted any medications: No Patient Arrived: Ambulatory Any new allergies or adverse reactions: No Arrival Time: 10:20 Had Ivan fall or experienced change in No Accompanied By: self activities of daily living that may affect Transfer Assistance: None risk of falls: Patient Identification Verified: Yes Signs or symptoms of abuse/neglect since last visito No Secondary Verification Process Completed: Yes Hospitalized since last visit: No Patient Requires Transmission-Based Precautions: No Implantable device outside of the clinic excluding No Patient Has Alerts: Yes cellular tissue based products placed in the center Patient Alerts: Patient on Blood Thinner since last visit: Has Dressing in Place as Prescribed: Yes Pain Present Now: Yes Electronic Signature(s) Signed: 06/29/2021 12:57:37 PM By: [name] RN, BSN Entered By: [name] on 06/29/2021 10:21:06 -------------------------------------------------------------------------------- Clinic Level of Care Assessment Details Patient Name: Date of Service: Ivan [name], Ivan BERT W. 06/29/2021 10:00 Ivan [name] Medical Record Number: [id number] Patient Account Number: [id number] Date of Birth/Sex: Treating RN: 03/26/1946 (76 y.o. Ivan [name] Primary Care Ivan [name]: Ivan [name] Other Clinician: Referring Ivan [name]: Treating Ivan [name]/Extender: Ivan [name] in Treatment: 2 Clinic Level of Care Assessment Items TOOL 4  Quantity Score X- 1 0 Use when only an EandM is performed on FOLLOW-UP visit ASSESSMENTS - Nursing Assessment / Reassessment X- 1 10 Reassessment of Co-morbidities (includes updates in patient status) X- 1 5 Reassessment of Adherence to Treatment Plan ASSESSMENTS - Wound and Skin Ivan ssessment / Reassessment X - Simple Wound Assessment / Reassessment - one wound 1 5 []  - 0 Complex Wound Assessment / Reassessment - multiple wounds []  - 0 Dermatologic / Skin Assessment (not related to wound area) ASSESSMENTS - Focused Assessment []  - 0 Circumferential Edema Measurements - multi extremities []  - 0 Nutritional Assessment / Counseling / Intervention []  - 0 Lower Extremity Assessment (monofilament, tuning fork, pulses) []  - 0 Peripheral Arterial Disease Assessment (using hand held doppler) ASSESSMENTS - Ostomy and/or Continence Assessment and Care []  - 0 Incontinence Assessment and Management []  - 0 Ostomy Care Assessment and Management (repouching, etc.) PROCESS - Coordination of Care []  - 0 Simple Patient / Family Education for ongoing care X- 1 20 Complex (extensive) Patient / Family Education for ongoing care X- 1 10 Staff obtains Programmer, systems, Records, T Results / Process Orders est []  - 0 Staff telephones HHA, Nursing Homes / Clarify orders / etc []  - 0 Routine Transfer to another Facility (non-emergent condition) []  - 0 Routine Hospital Admission (non-emergent condition) []  - 0 New Admissions / Biomedical engineer / Ordering NPWT Apligraf, etc. , []  - 0 Emergency Hospital Admission (emergent condition) []  - 0 Simple Discharge Coordination []  - 0 Complex (extensive) Discharge Coordination PROCESS - Special Needs []  - 0 Pediatric / Minor Patient Management []  - 0 Isolation Patient Management []  - 0 Hearing / Language / Visual special needs []  - 0 Assessment of Community assistance (transportation, D/C planning, etc.) []  - 0 Additional assistance / Altered  mentation []  - 0 Support Surface(s) Assessment (bed, cushion, seat, etc.) INTERVENTIONS - Wound Cleansing / Measurement X - Simple Wound Cleansing - one wound 1 5 []  -  0 Complex Wound Cleansing - multiple wounds X- 1 5 Wound Imaging (photographs - any number of wounds) []  - 0 Wound Tracing (instead of photographs) X- 1 5 Simple Wound Measurement - one wound []  - 0 Complex Wound Measurement - multiple wounds INTERVENTIONS - Wound Dressings X - Small Wound Dressing one or multiple wounds 1 10 []  - 0 Medium Wound Dressing one or multiple wounds []  - 0 Large Wound Dressing one or multiple wounds []  - 0 Application of Medications - topical []  - 0 Application of Medications - injection INTERVENTIONS - Miscellaneous []  - 0 External ear exam []  - 0 Specimen Collection (cultures, biopsies, blood, body fluids, etc.) []  - 0 Specimen(s) / Culture(s) sent or taken to Lab for analysis []  - 0 Patient Transfer (multiple staff / Civil Service fast streamer / Similar devices) []  - 0 Simple Staple / Suture removal (25 or less) []  - 0 Complex Staple / Suture removal (26 or more) []  - 0 Hypo / Hyperglycemic Management (close monitor of Blood Glucose) []  - 0 Ankle / Brachial Index (ABI) - do not check if billed separately X- 1 5 Vital Signs Has the patient been seen at the hospital within the last three years: Yes Total Score: 80 Level Of Care: New/Established - Level 3 Electronic Signature(s) Signed: 06/29/2021 12:25:37 PM By: [name] Entered By: [name] on 06/29/2021 11:16:59 -------------------------------------------------------------------------------- Encounter Discharge Information Details Patient Name: Date of Service: Ivan [name], Ivan BERT W. 06/29/2021 10:00 Ivan [name] Medical Record Number: [id number] Patient Account Number: [id number] Date of Birth/Sex: Treating RN: December 21, 1945 (76 y.o. Ivan [name] Primary Care Ivan [name]: Ivan [name] Other Clinician: Referring Keyri Salberg: Treating  Ivan [name]/Extender: Ivan [name] in Treatment: 2 Encounter Discharge Information Items Discharge Condition: Stable Ambulatory Status: Ambulatory Discharge Destination: Home Transportation: Private Auto Schedule Follow-up Appointment: Yes Clinical Summary of Care: Provided on 06/29/2021 Form Type Recipient Paper Patient Patient Electronic Signature(s) Signed: 06/29/2021 12:25:37 PM By: [name] Entered By: [name] on 06/29/2021 11:17:54 -------------------------------------------------------------------------------- Lower Extremity Assessment Details Patient Name: Date of Service: Ivan [name], Ivan BERT W. 06/29/2021 10:00 Ivan [name] Medical Record Number: [id number] Patient Account Number: [id number] Date of Birth/Sex: Treating RN: 03-22-1946 (76 y.o. Ivan [name] Primary Care Sherilynn Dieu: Ivan [name] Other Clinician: Referring Loghan Subia: Treating Antwoin Lackey/Extender: Ivan [name] in Treatment: 2 Electronic Signature(s) Signed: 06/29/2021 12:57:37 PM By: [name] RN, BSN Entered By: [name] on 06/29/2021 10:24:12 -------------------------------------------------------------------------------- Multi Wound Chart Details Patient Name: Date of Service: Ivan [name], Ivan BERT W. 06/29/2021 10:00 Ivan [name] Medical Record Number: [id number] Patient Account Number: [id number] Date of Birth/Sex: Treating RN: 10/09/1945 (76 y.o. Ivan [name] Primary Care Kiari Hosmer: Ivan [name] Other Clinician: Referring Lavene Penagos: Treating Prisca Gearing/Extender: Kizzie Ide, Carina [name] in Treatment: 2 Vital Signs Height(in): Pulse(bpm): 4 Weight(lbs): Blood Pressure(mmHg): 125/65 Body Mass Index(BMI): Temperature(F): 98.7 Respiratory Rate(breaths/min): 20 Photos: [N/Ivan:N/Ivan] Left Trochanter N/Ivan N/Ivan Wound Location: Pressure Injury N/Ivan N/Ivan Wounding Event: Pressure Ulcer N/Ivan N/Ivan Primary Etiology: Cataracts, Coronary Artery Disease,  N/Ivan N/Ivan Comorbid History: Hypertension, Myocardial Infarction, Osteoarthritis, Osteomyelitis 05/31/2019 N/Ivan N/Ivan Date Acquired: 2 N/Ivan N/Ivan [name] of Treatment: Open N/Ivan N/Ivan Wound Status: No N/Ivan N/Ivan Wound Recurrence: 0.5x0.2x0.6 N/Ivan N/Ivan Measurements L x W x D (cm) 0.079 N/Ivan N/Ivan Ivan (cm) : rea 0.047 N/Ivan N/Ivan Volume (cm) : 66.50% N/Ivan N/Ivan % Reduction in Ivan rea: 77.80% N/Ivan N/Ivan % Reduction in Volume: Category/Stage III N/Ivan N/Ivan Classification: Medium N/Ivan N/Ivan Exudate Ivan mount: Serosanguineous N/Ivan N/Ivan Exudate Type:  red, brown N/Ivan N/Ivan Exudate Color: Well defined, not attached N/Ivan N/Ivan Wound Margin: Large (67-100%) N/Ivan N/Ivan Granulation Ivan mount: Red, Pink N/Ivan N/Ivan Granulation Quality: Small (1-33%) N/Ivan N/Ivan Necrotic Ivan mount: Fat Layer (Subcutaneous Tissue): Yes N/Ivan N/Ivan Exposed Structures: Fascia: No Tendon: No Muscle: No Joint: No Bone: No Large (67-100%) N/Ivan N/Ivan Epithelialization: Treatment Notes Wound #4 (Trochanter) Wound Laterality: Left Cleanser Soap and Water Discharge Instruction: May shower and wash wound with dial antibacterial soap and water prior to dressing change. Wound Cleanser Discharge Instruction: Cleanse the wound with wound cleanser prior to applying Ivan clean dressing using gauze sponges, not tissue or cotton balls. Peri-Wound Care Skin Prep Discharge Instruction: Use skin prep as directed Topical Skintegrity Hydrogel 4 (oz) Discharge Instruction: Patient to use Solosite Primary Dressing Hydrofera Blue Rope Secondary Dressing Zetuvit Plus Silicone Border Dressing 4x4 (in/in) Discharge Instruction: Apply silicone border over primary dressing as directed. Secured With Compression Wrap Compression Stockings Add-Ons Electronic Signature(s) Signed: 06/29/2021 11:55:33 AM By: Kalman Shan DO Signed: 07/02/2021 5:43:45 PM By: Baruch Gouty RN, BSN Entered By: Kalman Shan on 06/29/2021  11:50:26 -------------------------------------------------------------------------------- Multi-Disciplinary Care Plan Details Patient Name: Date of Service: Ivan [name], Ivan BERT W. 06/29/2021 10:00 Ivan [name] Medical Record Number: [id number] Patient Account Number: [id number] Date of Birth/Sex: Treating RN: 1946/01/17 (76 y.o. Ivan [name] Primary Care Fusae Florio: Ivan [name] Other Clinician: Referring Elizar Alpern: Treating Zahari Fazzino/Extender: Kizzie Ide, Magdalene Molly [name] in Treatment: 2 Active Inactive Wound/Skin Impairment Nursing Diagnoses: Impaired tissue integrity Goals: Patient/caregiver will verbalize understanding of skin care regimen Date Initiated: 06/15/2021 Target Resolution Date: 07/13/2021 Goal Status: Active Ulcer/skin breakdown will have Ivan volume reduction of 30% by week 4 Date Initiated: 06/15/2021 Target Resolution Date: 07/13/2021 Goal Status: Active Interventions: Assess patient/caregiver ability to obtain necessary supplies Assess patient/caregiver ability to perform ulcer/skin care regimen upon admission and as needed Assess ulceration(s) every visit Provide education on ulcer and skin care Treatment Activities: Topical wound management initiated : 06/15/2021 Notes: Electronic Signature(s) Signed: 06/29/2021 12:25:37 PM By: [name] Entered By: [name] on 06/29/2021 11:02:14 -------------------------------------------------------------------------------- Pain Assessment Details Patient Name: Date of Service: Ivan [name], Ivan BERT W. 06/29/2021 10:00 Ivan [name] Medical Record Number: [id number] Patient Account Number: [id number] Date of Birth/Sex: Treating RN: 03/27/1946 (77 y.o. Ivan [name] Primary Care Gretna Bergin: Ivan [name] Other Clinician: Referring Elma Shands: Treating Rubin Dais/Extender: Ivan [name] in Treatment: 2 Active Problems Location of Pain Severity and Description of Pain Patient Has Paino Yes Site  Locations Pain Location: Generalized Pain Rate the pain. Current Pain Level: 7 Worst Pain Level: 10 Least Pain Level: 0 Tolerable Pain Level: 8 Pain Management and Medication Current Pain Management: Medication: No Cold Application: No Rest: No Massage: No Activity: No T.E.N.S.: No Heat Application: No Leg drop or elevation: No Is the Current Pain Management Adequate: Adequate How does your wound impact your activities of daily livingo Sleep: No Bathing: No Appetite: No Relationship With Others: No Bladder Continence: No Emotions: No Bowel Continence: No Work: No Toileting: No Drive: No Dressing: No Hobbies: No Engineer, maintenance) Signed: 06/29/2021 12:57:37 PM By: [name] RN, BSN Entered By: [name] on 06/29/2021 10:23:21 -------------------------------------------------------------------------------- Patient/Caregiver Education Details Patient Name: Date of Service: Ivan [name], Ivan BERT W. 2/17/2023andnbsp10:00 [facility] Record Number: [id number] Patient Account Number: [id number] Date of Birth/Gender: Treating RN: 1946-01-12 (76 y.o. Ivan [name] Primary Care Physician: Ivan [name] Other Clinician: Referring Physician: Treating Physician/Extender: Tye Savoy in Treatment: 2 Education Assessment Education  Provided To: Patient Education Topics Provided Wound/Skin Impairment: Methods: Explain/Verbal, Printed Responses: State content correctly Electronic Signature(s) Signed: 06/29/2021 12:25:37 PM By: [name] Entered By: [name] on 06/29/2021 11:02:29 -------------------------------------------------------------------------------- Wound Assessment Details Patient Name: Date of Service: Ivan [name], Ivan BERT W. 06/29/2021 10:00 Ivan [name] Medical Record Number: [id number] Patient Account Number: [id number] Date of Birth/Sex: Treating RN: 10-27-1945 (76 y.o. Ivan [name] Primary Care Aaliyha Mumford: Ivan [name] Other Clinician: Referring Hanae Waiters: Treating Harve Spradley/Extender: Ivan [name] in Treatment: 2 Wound Status Wound Number: 4 Primary Pressure Ulcer Etiology: Wound Location: Left Trochanter Wound Open Wounding Event: Pressure Injury Status: Date Acquired: 05/31/2019 Comorbid Cataracts, Coronary Artery Disease, Hypertension, Myocardial [name] Of Treatment: 2 History: Infarction, Osteoarthritis, Osteomyelitis Clustered Wound: No Photos Wound Measurements Length: (cm) 0.5 Width: (cm) 0.2 Depth: (cm) 0.6 Area: (cm) 0.079 Volume: (cm) 0.047 % Reduction in Area: 66.5% % Reduction in Volume: 77.8% Epithelialization: Large (67-100%) Tunneling: No Undermining: No Wound Description Classification: Category/Stage III Wound Margin: Well defined, not attached Exudate Amount: Medium Exudate Type: Serosanguineous Exudate Color: red, brown Foul Odor After Cleansing: No Slough/Fibrino Yes Wound Bed Granulation Amount: Large (67-100%) Exposed Structure Granulation Quality: Red, Pink Fascia Exposed: No Necrotic Amount: Small (1-33%) Fat Layer (Subcutaneous Tissue) Exposed: Yes Necrotic Quality: Adherent Slough Tendon Exposed: No Muscle Exposed: No Joint Exposed: No Bone Exposed: No Treatment Notes Wound #4 (Trochanter) Wound Laterality: Left Cleanser Soap and Water Discharge Instruction: May shower and wash wound with dial antibacterial soap and water prior to dressing change. Wound Cleanser Discharge Instruction: Cleanse the wound with wound cleanser prior to applying Ivan clean dressing using gauze sponges, not tissue or cotton balls. Peri-Wound Care Skin Prep Discharge Instruction: Use skin prep as directed Topical Skintegrity Hydrogel 4 (oz) Discharge Instruction: Patient to use Solosite Primary Dressing Hydrofera Blue Rope Secondary Dressing Zetuvit Plus Silicone Border Dressing 4x4 (in/in) Discharge Instruction: Apply silicone border over  primary dressing as directed. Secured With Compression Wrap Compression Stockings Environmental education officer) Signed: 06/29/2021 12:52:20 PM By: Rhae Hammock RN Signed: 06/29/2021 12:57:37 PM By: [name] RN, BSN Entered By: Rhae Hammock on 06/29/2021 10:28:37 -------------------------------------------------------------------------------- Vitals Details Patient Name: Date of Service: Ivan [name], Ivan BERT W. 06/29/2021 10:00 Ivan [name] Medical Record Number: [id number] Patient Account Number: [id number] Date of Birth/Sex: Treating RN: [birth year] (76 y.o. Ivan [name] Primary Care Jairon Ripberger: Ivan [name] Other Clinician: Referring Kashius Dominic: Treating Starlette Thurow/Extender: Ivan [name] in Treatment: 2 Vital Signs Time Taken: 10:20 Temperature (F): 98.7 Pulse (bpm): 76 Respiratory Rate (breaths/min): 20 Blood Pressure (mmHg): 125/65 Reference Range: 80 - 120 mg / dl Electronic Signature(s) Signed: 06/29/2021 12:57:37 PM By: [name] RN, BSN Entered By: [name] on 06/29/2021 10:23:01

## 2021-07-02 NOTE — Progress Notes (Signed)
Ivan [name] ([id number]) Visit Report for 06/29/2021 Chief Complaint Document Details Patient Name: Date of Service: Ivan [name]. 06/29/2021 10:00 A M Medical Record Number: [id number] Patient Account Number: [id number] Date of Birth/Sex: Treating RN: [birth year] (76 y.o. Ivan [name] Primary Care Provider: Dorris [name] Other Clinician: Referring Provider: Treating Provider/Extender: Ivan [name], Ivan [name] in Treatment: 2 Information Obtained from: Patient Chief Complaint 07/29/2019; patient is here for review of a fairly substantial wound over the left greater trochanter 07/04/2020; patient returns to clinic for follow-up of the original ulcer over the left greater trochanter 10/24/2020; patient returns to clinic for follow-up of his original ulcer over the left greater trochanter 06/15/2021; patient returns the clinic for original ulcer over the left greater trochanter Electronic Signature(s) Signed: 06/29/2021 11:55:33 AM By: Ivan Shan DO Entered By: Ivan [name] on 06/29/2021 11:50:35 -------------------------------------------------------------------------------- HPI Details Patient Name: Date of Service: Ivan [name], Ivan BERT W. 06/29/2021 10:00 A M Medical Record Number: [id number] Patient Account Number: [id number] Date of Birth/Sex: Treating RN: 09-20-1945 (76 y.o. 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 He required admission to hospital from 1/5 through 1/9 with Covid pneumonia. At that point he apparently left AMA. He was noted to have a left hip bruise at the time of discharge. I suspect this really was a deep tissue injury. He was readmitted from 1/13 through  1//27 with altered mental status Covid pneumonia and delirium. At that point he had a left hip and thigh wound which had worsened. Felt to be a pressure injury. He was seen by general surgery who recommended normal saline wet-to-dry twice daily he did not have a debridement. After he left at that point he was incapacitated by increasing lumbar pain. An MRI on 2/9 showed discitis and osteomyelitis at L1/2. He asked aspiration of 1 cm fluid however I think this culture negative. He was also noted at this point to have a very large wound over the left greater trochanter. He was taken to the OR by Ivan [name] for debridement with ACell placement. He was placed in a wound VAC. A culture at that point showed Enterococcus and Enterobacter. An MRI on 3/15 showed no evidence of left hip septic arthritis or osteomyelitis. Extensive subcutaneous edema within the visualized soft tissue of the left thigh and prominent left inguinal lymph nodes likely reactive. He was started on IV cefepime and vancomycin for 6 [name]. He was discharged to [facility] facility as the patient. There I gather the wound developed the necrotic surface. He was seen by the wound care doctor in house who correctly pointed out that a wound VAC would not do anything with a necrotic surface. Since then he has been using Santyl The patient is now at home. He was on vancomycin and cefepime. He developed hives and a generalized rash and was seen in the ER on 3/15 by Ivan [name] recommendation he was changed to meropenem however he also apparently developed hives after this. He says the original hives stopped after the cefepime was stopped and follow-up hives only developed after the meropenem. He is only taken 1 dose of the fear of the meropenem and now is on vancomycin. 3/25; the patient was seen by Ivan [name] on 3/22. Because of intolerances to meropenem which  I communicated with Ivan [name] about he is now on daptomycin and Cipro until  sometime in early April. He seems to be tolerating the antibiotics well. We are using Santyl back by wet-to-dry dressings. And making some improvement in the general surface of this very large wound 4/8; we are using Santyl back by wet-to-dry. We are making progress in terms of prepping the wound for a wound VAC. Still necrotic material requiring debridement. 4/16; we are using Santyl back by wet-to-dry. Not convinced that his wife is packing this properly there is a 6 cm tunnel at 12:00. We brought this to attention. Still requiring mechanical debridement 4/29; 2-week follow-up. In general the wound is better however there is considerable tunneling tunneling to the greater trochanter itself. There is still some debris in the deeper parts of this wound I did not do mechanical debridement on this today. I am going to change the dressing to quarter strength Dakin's wet-to-dry. We will put a wound VAC through his insurance. His wife has had brain damage from a previous meningioma surgery. She is not comfortable around strangers in her home. The patient is concerned about this 5/18; patient is using quarter strength Dakin's wet to dry. The problem is a deep pressure area over the left greater trochanter. There was some question from her intake nurses that this is actually come in and some question raised about whether he would require a wound VAC or not. The issue with the wound VAC was complicated predominantly by the patient's reluctance to allow people into his home i.e. home health although he tells me he has this mostly "sorted out" 7/1; this is a patient that I have not seen in almost 6 [name]. Apparently canceled several appointments related to family problems deaths in the family etc. He is wife has ophthalmologic surgery next week. I had been trying to get a wound VAC ordered for this deep probing wound over the left greater trochanter but so far I think he is simply doing a Dakin's wet-to-dry and  our intake nurse did not think there was any Dakin's on it. I have a note from Ivan [name] of infectious disease on 6/30. He has now been off doxycycline and ciprofloxacin for several [name]. He has no undue pain or drainage. He also had discitis and osteomyelitis at L1-L2 with a 1 cm left psoas abscess as well as some increased signal at T10/T11. He is not having any problems related to this. 12/16/2019 on evaluation today patient's wound actually is showing some signs of bit of improvement which is good news. Fortunately there is no signs of active infection at this time which is good news. He tells me that the nurses have been coming to change this 3 times a week and doing a great job. He told the intake nurse that they had only been coming out once a week. I am not really sure what is going on with the discrepancy and stories here. Nonetheless I do believe that he needs to have this changed 3 times a week on a regular basis he does seem to have a little bit of a fungal infection around the wound. 8/19; 2-week follow-up. I have not seen this patient in over 6 [name]. There is been a dramatic improvement in the overall wound volume. Circumference of the wound is now small although there is still considerable undermining from roughly 9-2 o'clock maximum 1:00 at roughly 4.7 cm. There is no purulent drainage. He has some irritation around the  wound on the normal skin. They are using miconazole on this. 9/2; 2-week follow-up. Dimensions of the wound circumference look quite good. There is tunneling at 4.3 cm at 1:00 versus 4.7 last time. The area continues to look like it is contracting with the wound VAC and I think the treatment of choice here will be continuing to the wound VAC as long as it seems practical. There is no evidence of infection. As far as I am aware he is offloading this aggressively 9/16; 2-week follow-up. Left greater trochanter. Dimensions about the same still about 4.9 cm of maximal  undermining. Nevertheless the visible tissue that I can see looks healthy. We have a wound VAC and really from the perspective of how this looks initially this is gotten a lot better however not much change in the last week 9/30 2-week follow-up. Left greater trochanter. He has a tunnel at 12:00 that goes about 7 cm. From 10-12 lesser degrees of undermining. HOWEVER he comes in today telling us that he is going to have to go back to work. He will not be able to work with a wound VAC and he wants that discontinued. There was not much discussion. This is a financial issue for him. He also has erythema around the wound orifice which has satellite lesions around it this is no doubt cutaneous candidiasis 10/12; 2-week follow-up. Left greater trochanter. 7.2 cm of tunneling. 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 He has not been systemically unwell. 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 He is using silver alginate which he is dressing himself, ABDs with a large plastic occlusive dressing. Dimensions are somewhat better although this is an irregular wound. When he was here the last time he had a lot of drainage coming out of this wound. The culture I did showed methicillin sensitive staph aureus. I gave him 10 days of doxycycline and he is just finishing up on this. He states the drainage is a lot better 04/03/2020 upon evaluation today patient appears to be doing well with regard to his wound as far as the overall appearance is concerned the one issue that he is noting is that he does have some green drainage occurring at times. Obviously this brings up the concern for Pseudomonas I will see anything visually right now that has me overly concerned about at the same time that history does concern me. The patient would like to see if we can attempt to treat  this today. I think that being that green drainage is pretty much pathognomonic for a Pseudomonas infection I think he can continue to doxycycline but probably should do a couple [name] of Cipro to try to see if that clears up the drainage if not then we may need to delve into a more extensive culture possibly PCR at that point. With regard to the wound itself also believe based on talking with the patient today that he could be a candidate for a flap surgery that something he would like to consider READMISSION 07/04/2020 Ivan [name] is a 76 year old man we had in clinic until the end of November 2021. He had a substantial wound on the left greater trochanter that required an original IandD by Ivan [name] early in 2021. This was an infected wound and had IV antibiotics directed by infectious disease. He also had  an L1-L2 discitis. We may have him in clinic here we initially put him in a wound VAC and he actually did quite well with this with filling in of the underlying tissue however he had to go back to work. 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 He is still working there 40 hours a week second shift. He was coming to this clinic until late in November I am not exactly sure what happened. I had not referred him to plastic surgery because he would have to inevitably take time off work that he was not prepared to do. The orifice of the wound was too small to really reconsider a wound VAC and that still true. He arrives back in clinic with a direct depth of the wound at 1.5 cm that tunnel goes to 3.1 cm from 3.6 last time. He saw Dr. Johnnye Sima on 04/14/2020. He ordered a follow-up MRI. This showed persistence of a peritrochanteric fluid collection in the bursa which was slightly larger than the previous image. This communicated with the wounds. The patient still says he is having clear fluid drainage and he is changing his dressing twice a day. He is currently  using predominantly half-strength Dakin's wet-to-dry and he correctly notes that the wound is clean and has come in significantly albeit slowly. He has not noticed any systemic symptoms. No real pain. Last measurements of his C-reactive protein and sedimentation rate were 3.7 and 2 respectively. He was supposed to follow-up with Dr. Johnnye Sima I am not sure that he has. Dr. Johnnye Sima had tried to organize an appointment with Ivan [name] I do not think that ever got done either. The patient tells me he is not willing to consider any further surgery to this area until he has short-term disability time which he thinks will happen sometime in May or June 3/22; monthly follow-up. Minimal change in the important measurements of depth at 1.7 and the 12:00 tunnel at 2.6. We have been referred back to Ivan [name] I think by Dr. Johnnye Sima although he does not yet have a plastics appointment 10/24/2020; patient dropped off our schedule when he returns to clinic with for review of the same wound over the left greater trochanter. He says the reason is because of medical issues including TUR of a benign bladder tumor. He also had cataract issues. In any case he has been using a hydrogel wet-to-dry to his left hip wound. He is working full-time this does not limit him. Direct depth today measured at 2.1 with undermining from 8-12 also with 2.1. This represents some improvement. He tells me he never did see Ivan [name] again because he could not coordinate another appointment. He is not really wanting to do the flap surgery type of thing he says this does not inhibit his lifestyle 7/14; 1 month follow-up he is using hydrogel wet-to-dry. The wound has 2 cm of direct depth and a 2.5 cm tunnel at 1:00 I am not sure that this has changed too much. Once again I have talked to him about whether he wants to try a different dressing Hydrofera Blue packing rope, polymen Ag rope etc. He does not seem at all interested in  this. Wants to do the hydrogel wet-to-dry packing for another month 8/25; 1 month follow-up using hydrogel wet-to-dry. I think he is actually done better. Wound is come in since the last time he was here a month ago. He reports no other issues 9/22; he is using  hydrogel wet-to-dry predominantly however recently he changed to Dakin's wet-to-dry because he perceived an odor. Not much change in dimensions per our intake nurses. He has no systemic symptoms. 2/30/2023 Ivan [name] has followed in the clinic over several years for a wound to his left hip. This wound has never closed. He was last seen on 02/01/2021 and was using hydrogel wet-to-dry dressings. He currently has no issues or complaints today. He denies signs of infection. 2/17; patient presents for follow-up. He has no issues or complaints today. He has been using Hydrofera Blue to the wound bed with hydrogel. He denies signs of infection. He has not obtained his left hip x-ray. Electronic Signature(s) Signed: 06/29/2021 11:55:33 AM By: Ivan Shan DO Entered By: Ivan [name] on 06/29/2021 11:51:17 -------------------------------------------------------------------------------- Physical Exam Details Patient Name: Date of Service: Ivan [name], Ivan BERT W. 06/29/2021 10:00 A M Medical Record Number: [id number] Patient Account Number: [id number] Date of Birth/Sex: Treating RN: 29-Jan-1946 (76 y.o. Ivan [name] Primary Care Provider: Dorris [name] Other Clinician: Referring Provider: Treating Provider/Extender: Ivan [name] in Treatment: 2 Constitutional respirations regular, non-labored and within target range for patient.Marland Kitchen [facility] pleasant and cooperative. Notes Left hip: Over the bony prominence there is an open wound with granulation tissue that does not appear healthy. The wound tunnels at the 1 o'clock position. No obvious signs of infection. Electronic Signature(s) Signed: 06/29/2021  11:55:33 AM By: Ivan Shan DO Entered By: Ivan [name] on 06/29/2021 11:52:14 -------------------------------------------------------------------------------- Physician Orders Details Patient Name: Date of Service: Ivan [name], Ivan BERT W. 06/29/2021 10:00 A M Medical Record Number: [id number] Patient Account Number: [id number] Date of Birth/Sex: Treating RN: 07-08-1945 (76 y.o. [name] Primary Care Provider: Dorris [name] Other Clinician: Referring Provider: Treating Provider/Extender: Ivan [name] in Treatment: 2 Verbal / Phone Orders: No Diagnosis Coding Follow-up Appointments ppointment in 2 [name]. - with Dr. Heber [place] Return A Bathing/ Shower/ Hygiene May shower and wash wound with soap and water. Off-Loading Other: - Avoid prolonged pressure to area. Additional Orders / Instructions Follow Nutritious Diet Wound Treatment Wound #4 - Trochanter Wound Laterality: Left Cleanser: Soap and Water 1 x Per Day/30 Days Discharge Instructions: May shower and wash wound with dial antibacterial soap and water prior to dressing change. Cleanser: Wound Cleanser 1 x Per Day/30 Days Discharge Instructions: Cleanse the wound with wound cleanser prior to applying a clean dressing using gauze sponges, not tissue or cotton balls. Peri-Wound Care: Skin Prep 1 x Per Day/30 Days Discharge Instructions: Use skin prep as directed Topical: Skintegrity Hydrogel 4 (oz) 1 x Per Day/30 Days Discharge Instructions: Patient to use Solosite Prim Dressing: Hydrofera Blue Rope ary 1 x Per Day/30 Days Secondary Dressing: Zetuvit Plus Silicone Border Dressing 4x4 (in/in) 1 x Per Day/30 Days Discharge Instructions: Apply silicone border over primary dressing as directed. Electronic Signature(s) Signed: 06/29/2021 11:55:33 AM By: Ivan Shan DO Entered By: Ivan [name] on 06/29/2021  11:52:29 -------------------------------------------------------------------------------- Problem List Details Patient Name: Date of Service: Ivan [name], Ivan BERT W. 06/29/2021 10:00 A M Medical Record Number: [id number] Patient Account Number: [id number] Date of Birth/Sex: Treating RN: August 26, 1945 (76 y.o. Ivan [name] Primary Care Provider: Dorris [name] Other Clinician: Referring Provider: Treating Provider/Extender: Ivan [name] in Treatment: 2 Active Problems ICD-10 Encounter Code Description Active Date MDM Diagnosis [phone] Pressure ulcer of left hip, stage 3 06/15/2021 No Yes I10 Essential (primary) hypertension 06/15/2021 No Yes M46.28 Osteomyelitis of vertebra, sacral and sacrococcygeal region 06/15/2021 No Yes  Inactive Problems Resolved Problems Electronic Signature(s) Signed: 06/29/2021 11:55:33 AM By: Ivan Shan DO Entered By: Ivan [name] on 06/29/2021 11:50:20 -------------------------------------------------------------------------------- Progress Note Details Patient Name: Date of Service: Ivan [name], Ivan BERT W. 06/29/2021 10:00 A M Medical Record Number: [id number] Patient Account Number: [id number] Date of Birth/Sex: Treating RN: 1945/05/21 (76 y.o. Ivan [name] Primary Care Provider: Dorris [name] Other Clinician: Referring Provider: Treating Provider/Extender: Ivan [name], Ivan [name] in Treatment: 2 Subjective Chief Complaint Information obtained from Patient 07/29/2019; patient is here for review of a fairly substantial wound over the left greater trochanter 07/04/2020; patient returns to clinic for follow-up of the original ulcer over the left greater trochanter 10/24/2020; patient returns to clinic for follow-up of his original ulcer over the left greater trochanter 06/15/2021; patient returns the clinic for original ulcer over the left greater trochanter History of Present Illness  (HPI) ADMISSION 07/29/2019 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 He required admission to hospital from 1/5 through 1/9 with Covid pneumonia. At that point he apparently left AMA. He was noted to have a left hip bruise at the time of discharge. I suspect this really was a deep tissue injury. He was readmitted from 1/13 through 1//27 with altered mental status Covid pneumonia and delirium. At that point he had a left hip and thigh wound which had worsened. Felt to be a pressure injury. He was seen by general surgery who recommended normal saline wet-to-dry twice daily he did not have a debridement. After he left at that point he was incapacitated by increasing lumbar pain. An MRI on 2/9 showed discitis and osteomyelitis at L1/2. He asked aspiration of 1 cm fluid however I think this culture negative. He was also noted at this point to have a very large wound over the left greater trochanter. He was taken to the OR by Ivan [name] for debridement with ACell placement. He was placed in a wound VAC. A culture at that point showed Enterococcus and Enterobacter. An MRI on 3/15 showed no evidence of left hip septic arthritis or osteomyelitis. Extensive subcutaneous edema within the visualized soft tissue of the left thigh and prominent left inguinal lymph nodes likely reactive. He was started on IV cefepime and vancomycin for 6 [name]. He was discharged to [facility] as the patient. There I gather the wound developed the necrotic surface. He was seen by the wound care doctor in house who correctly pointed out that a wound VAC would not do anything with a necrotic surface. Since then he has been using Santyl The patient is now at home. He was on vancomycin and cefepime. He developed hives and a generalized rash and was seen in the ER on 3/15 by Ivan [name] recommendation he was changed to meropenem however he also apparently developed hives after this.  He says the original hives stopped after the cefepime was stopped and follow-up hives only developed after the meropenem. He is only taken 1 dose of the fear of the meropenem and now is on vancomycin. 3/25; the patient was seen by Ivan [name] on 3/22. Because of intolerances to meropenem which I communicated with Ivan [name] about he is now on daptomycin and Cipro until sometime in early April. He seems to be tolerating the antibiotics well. We are using Santyl back by wet-to-dry dressings. And making some improvement in the general surface of this very large wound 4/8; we are using Santyl back by wet-to-dry. We are making  progress in terms of prepping the wound for a wound VAC. Still necrotic material requiring debridement. 4/16; we are using Santyl back by wet-to-dry. Not convinced that his wife is packing this properly there is a 6 cm tunnel at 12:00. We brought this to attention. Still requiring mechanical debridement 4/29; 2-week follow-up. In general the wound is better however there is considerable tunneling tunneling to the greater trochanter itself. There is still some debris in the deeper parts of this wound I did not do mechanical debridement on this today. I am going to change the dressing to quarter strength Dakin's wet-to-dry. We will put a wound VAC through his insurance. His wife has had brain damage from a previous meningioma surgery. She is not comfortable around strangers in her home. The patient is concerned about this 5/18; patient is using quarter strength Dakin's wet to dry. The problem is a deep pressure area over the left greater trochanter. There was some question from her intake nurses that this is actually come in and some question raised about whether he would require a wound VAC or not. The issue with the wound VAC was complicated predominantly by the patient's reluctance to allow people into his home i.e. home health although he tells me he has this mostly "sorted  out" 7/1; this is a patient that I have not seen in almost 6 [name]. Apparently canceled several appointments related to family problems deaths in the family etc. He is wife has ophthalmologic surgery next week. I had been trying to get a wound VAC ordered for this deep probing wound over the left greater trochanter but so far I think he is simply doing a Dakin's wet-to-dry and our intake nurse did not think there was any Dakin's on it. I have a note from Ivan [name] of infectious disease on 6/30. He has now been off doxycycline and ciprofloxacin for several [name]. He has no undue pain or drainage. He also had discitis and osteomyelitis at L1-L2 with a 1 cm left psoas abscess as well as some increased signal at T10/T11. He is not having any problems related to this. 12/16/2019 on evaluation today patient's wound actually is showing some signs of bit of improvement which is good news. Fortunately there is no signs of active infection at this time which is good news. He tells me that the nurses have been coming to change this 3 times a week and doing a great job. He told the intake nurse that they had only been coming out once a week. I am not really sure what is going on with the discrepancy and stories here. Nonetheless I do believe that he needs to have this changed 3 times a week on a regular basis he does seem to have a little bit of a fungal infection around the wound. 8/19; 2-week follow-up. I have not seen this patient in over 6 [name]. There is been a dramatic improvement in the overall wound volume. Circumference of the wound is now small although there is still considerable undermining from roughly 9-2 o'clock maximum 1:00 at roughly 4.7 cm. There is no purulent drainage. He has some irritation around the wound on the normal skin. They are using miconazole on this. 9/2; 2-week follow-up. Dimensions of the wound circumference look quite good. There is tunneling at 4.3 cm at 1:00 versus 4.7 last  time. The area continues to look like it is contracting with the wound VAC and I think the treatment of choice here will be continuing  to the wound VAC as long as it seems practical. There is no evidence of infection. As far as I am aware he is offloading this aggressively 9/16; 2-week follow-up. Left greater trochanter. Dimensions about the same still about 4.9 cm of maximal undermining. Nevertheless the visible tissue that I can see looks healthy. We have a wound VAC and really from the perspective of how this looks initially this is gotten a lot better however not much change in the last week 9/30 2-week follow-up. Left greater trochanter. He has a tunnel at 12:00 that goes about 7 cm. From 10-12 lesser degrees of undermining. HOWEVER he comes in today telling us that he is going to have to go back to work. He will not be able to work with a wound VAC and he wants that discontinued. There was not much discussion. This is a financial issue for him. He also has erythema around the wound orifice which has satellite lesions around it this is no doubt cutaneous candidiasis 10/12; 2-week follow-up. Left greater trochanter. 7.2 cm of tunneling. 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 He has not been systemically unwell. 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 He is using silver alginate which he is dressing himself, ABDs with a large plastic occlusive dressing. Dimensions are somewhat better although this is an irregular wound. When he was here the last time he had a lot of drainage coming out of this wound. The culture I did showed methicillin sensitive staph aureus. I gave him 10 days of doxycycline and he is just finishing up on this. He states the drainage is a lot better 04/03/2020 upon evaluation today patient appears to be doing well with regard to  his wound as far as the overall appearance is concerned the one issue that he is noting is that he does have some green drainage occurring at times. Obviously this brings up the concern for Pseudomonas I will see anything visually right now that has me overly concerned about at the same time that history does concern me. The patient would like to see if we can attempt to treat this today. I think that being that green drainage is pretty much pathognomonic for a Pseudomonas infection I think he can continue to doxycycline but probably should do a couple [name] of Cipro to try to see if that clears up the drainage if not then we may need to delve into a more extensive culture possibly PCR at that point. With regard to the wound itself also believe based on talking with the patient today that he could be a candidate for a flap surgery that something he would like to consider READMISSION 07/04/2020 Ivan [name] is a 76 year old man we had in clinic until the end of November 2021. He had a substantial wound on the left greater trochanter that required an original IandD by Ivan [name] early in 2021. This was an infected wound and had IV antibiotics directed by infectious disease. He also had an L1-L2 discitis. We may have him in clinic here we initially put him in a wound VAC and he actually did quite well with this with filling in of the underlying tissue however he had to go back to work. 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 He is still working  there 40 hours a week second shift. He was coming to this clinic until late in November I am not exactly sure what happened. I had not referred him to plastic surgery because he would have to inevitably take time off work that he was not prepared to do. The orifice of the wound was too small to really reconsider a wound VAC and that still true. He arrives back in clinic with a direct depth of the wound at 1.5 cm that tunnel goes to 3.1  cm from 3.6 last time. He saw Dr. Johnnye Sima on 04/14/2020. He ordered a follow-up MRI. This showed persistence of a peritrochanteric fluid collection in the bursa which was slightly larger than the previous image. This communicated with the wounds. The patient still says he is having clear fluid drainage and he is changing his dressing twice a day. He is currently using predominantly half-strength Dakin's wet-to-dry and he correctly notes that the wound is clean and has come in significantly albeit slowly. He has not noticed any systemic symptoms. No real pain. Last measurements of his C-reactive protein and sedimentation rate were 3.7 and 2 respectively. He was supposed to follow-up with Dr. Johnnye Sima I am not sure that he has. Dr. Johnnye Sima had tried to organize an appointment with Ivan [name] I do not think that ever got done either. The patient tells me he is not willing to consider any further surgery to this area until he has short-term disability time which he thinks will happen sometime in May or June 3/22; monthly follow-up. Minimal change in the important measurements of depth at 1.7 and the 12:00 tunnel at 2.6. We have been referred back to Ivan [name] I think by Dr. Johnnye Sima although he does not yet have a plastics appointment 10/24/2020; patient dropped off our schedule when he returns to clinic with for review of the same wound over the left greater trochanter. He says the reason is because of medical issues including TUR of a benign bladder tumor. He also had cataract issues. In any case he has been using a hydrogel wet-to-dry to his left hip wound. He is working full-time this does not limit him. Direct depth today measured at 2.1 with undermining from 8-12 also with 2.1. This represents some improvement. He tells me he never did see Ivan [name] again because he could not coordinate another appointment. He is not really wanting to do the flap surgery type of thing he says this does not  inhibit his lifestyle 7/14; 1 month follow-up he is using hydrogel wet-to-dry. The wound has 2 cm of direct depth and a 2.5 cm tunnel at 1:00 I am not sure that this has changed too much. Once again I have talked to him about whether he wants to try a different dressing Hydrofera Blue packing rope, polymen Ag rope etc. He does not seem at all interested in this. Wants to do the hydrogel wet-to-dry packing for another month 8/25; 1 month follow-up using hydrogel wet-to-dry. I think he is actually done better. Wound is come in since the last time he was here a month ago. He reports no other issues 9/22; he is using hydrogel wet-to-dry predominantly however recently he changed to Dakin's wet-to-dry because he perceived an odor. Not much change in dimensions per our intake nurses. He has no systemic symptoms. 2/30/2023 Ivan [name] has followed in the clinic over several years for a wound to his left hip. This wound has never closed. He was last seen on 02/01/2021  and was using hydrogel wet-to-dry dressings. He currently has no issues or complaints today. He denies signs of infection. 2/17; patient presents for follow-up. He has no issues or complaints today. He has been using Hydrofera Blue to the wound bed with hydrogel. He denies signs of infection. He has not obtained his left hip x-ray. Patient History Information obtained from Patient. Family History Cancer - Paternal Grandparents, Diabetes - Paternal Grandparents, Kidney Disease - Maternal Grandparents, No family history of Heart Disease, Hereditary Spherocytosis, Hypertension, Lung Disease, Seizures, Stroke, Thyroid Problems, Tuberculosis. Social History Former smoker - quit 5 years ago, Marital Status - Married, Alcohol Use - Rarely, Drug Use - No History, Caffeine Use - Daily. Medical History Eyes Patient has history of Cataracts Denies history of Glaucoma, Optic Neuritis Ear/Nose/Mouth/Throat Denies history of Chronic sinus  problems/congestion, Middle ear problems Hematologic/Lymphatic Denies history of Anemia, Hemophilia, Human Immunodeficiency Virus, Lymphedema, Sickle Cell Disease Respiratory Denies history of Aspiration, Asthma, Chronic Obstructive Pulmonary Disease (COPD), Pneumothorax, Sleep Apnea, Tuberculosis Cardiovascular Patient has history of Coronary Artery Disease, Hypertension, Myocardial Infarction Gastrointestinal Denies history of Cirrhosis , Colitis, Crohnoos, Hepatitis A, Hepatitis B, Hepatitis C Endocrine Denies history of Type I Diabetes, Type II Diabetes Genitourinary Denies history of End Stage Renal Disease Immunological Denies history of Lupus Erythematosus, Raynaudoos, Scleroderma Integumentary (Skin) Denies history of History of Burn Musculoskeletal Patient has history of Osteoarthritis, Osteomyelitis Denies history of Gout, Rheumatoid Arthritis Neurologic Denies history of Dementia, Neuropathy, Quadriplegia, Paraplegia, Seizure Disorder Oncologic Denies history of Received Chemotherapy, Received Radiation Psychiatric Denies history of Anorexia/bulimia, Confinement Anxiety Hospitalization/Surgery History - appendectomy. - cardiac catheterization. - cataract extraction. - cervical fusion. - CABG. - TURP. - cataract laser procedure. Medical A Surgical History Notes nd Endocrine testosterone deficiency Genitourinary BPH Oncologic skin cancer Objective Constitutional respirations regular, non-labored and within target range for patient.. Vitals Time Taken: 10:20 AM, Temperature: 98.7 F, Pulse: 76 bpm, Respiratory Rate: 20 breaths/min, Blood Pressure: 125/65 mmHg. Psychiatric pleasant and cooperative. General Notes: Left hip: Over the bony prominence there is an open wound with granulation tissue that does not appear healthy. The wound tunnels at the 1 o'clock position. No obvious signs of infection. Integumentary (Hair, Skin) Wound #4 status is Open. Original cause  of wound was Pressure Injury. The date acquired was: 05/31/2019. The wound has been in treatment 2 [name]. The wound is located on the Left Trochanter. The wound measures 0.5cm length x 0.2cm width x 0.6cm depth; 0.079cm^2 area and 0.047cm^3 volume. There is Fat Layer (Subcutaneous Tissue) exposed. There is no tunneling or undermining noted. There is a medium amount of serosanguineous drainage noted. The wound margin is well defined and not attached to the wound base. There is large (67-100%) red, pink granulation within the wound bed. There is a small (1-33%) amount of necrotic tissue within the wound bed including Adherent Slough. Assessment Active Problems ICD-10 Pressure ulcer of left hip, stage 3 Essential (primary) hypertension Osteomyelitis of vertebra, sacral and sacrococcygeal region Patient's wound has shown improvement in size and appearance since last clinic visit. No signs of surrounding infection. I recommended continuing with Hydrofera Blue and hydrogel. He states he is going to obtain his left hip x-ray soon. Follow-up in 2 [name] Plan Follow-up Appointments: Return Appointment in 2 [name]. - with Dr. Heber  Bathing/ Shower/ Hygiene: May shower and wash wound with soap and water. Off-Loading: Other: - Avoid prolonged pressure to area. Additional Orders / Instructions: Follow Nutritious Diet WOUND #4: - Trochanter Wound Laterality: Left Cleanser: Soap and  Water 1 x Per Day/30 Days Discharge Instructions: May shower and wash wound with dial antibacterial soap and water prior to dressing change. Cleanser: Wound Cleanser 1 x Per Day/30 Days Discharge Instructions: Cleanse the wound with wound cleanser prior to applying a clean dressing using gauze sponges, not tissue or cotton balls. Peri-Wound Care: Skin Prep 1 x Per Day/30 Days Discharge Instructions: Use skin prep as directed Topical: Skintegrity Hydrogel 4 (oz) 1 x Per Day/30 Days Discharge Instructions: Patient to use  Solosite Prim Dressing: Hydrofera Blue Rope 1 x Per Day/30 Days ary Secondary Dressing: Zetuvit Plus Silicone Border Dressing 4x4 (in/in) 1 x Per Day/30 Days Discharge Instructions: Apply silicone border over primary dressing as directed. 1. Hydrofera Blue with hydrogel 2. Follow-up in 2 [name] Electronic Signature(s) Signed: 06/29/2021 11:55:33 AM By: Ivan Shan DO Entered By: Ivan [name] on 06/29/2021 11:54:52 -------------------------------------------------------------------------------- HxROS Details Patient Name: Date of Service: Ivan [name], Ivan BERT W. 06/29/2021 10:00 A M Medical Record Number: [id number] Patient Account Number: [id number] Date of Birth/Sex: Treating RN: August 03, 1945 (76 y.o. Ivan [name] Primary Care Provider: Dorris [name] Other Clinician: Referring Provider: Treating Provider/Extender: Ivan [name] in Treatment: 2 Information Obtained From Patient Eyes Medical History: Positive for: Cataracts Negative for: Glaucoma; Optic Neuritis Ear/Nose/Mouth/Throat Medical History: Negative for: Chronic sinus problems/congestion; Middle ear problems Hematologic/Lymphatic Medical History: Negative for: Anemia; Hemophilia; Human Immunodeficiency Virus; Lymphedema; Sickle Cell Disease Respiratory Medical History: Negative for: Aspiration; Asthma; Chronic Obstructive Pulmonary Disease (COPD); Pneumothorax; Sleep Apnea; Tuberculosis Cardiovascular Medical History: Positive for: Coronary Artery Disease; Hypertension; Myocardial Infarction Gastrointestinal Medical History: Negative for: Cirrhosis ; Colitis; Crohns; Hepatitis A; Hepatitis B; Hepatitis C Endocrine Medical History: Negative for: Type I Diabetes; Type II Diabetes Past Medical History Notes: testosterone deficiency Genitourinary Medical History: Negative for: End Stage Renal Disease Past Medical History Notes: BPH Immunological Medical History: Negative for:  Lupus Erythematosus; Raynauds; Scleroderma Integumentary (Skin) Medical History: Negative for: History of Burn Musculoskeletal Medical History: Positive for: Osteoarthritis; Osteomyelitis Negative for: Gout; Rheumatoid Arthritis Neurologic Medical History: Negative for: Dementia; Neuropathy; Quadriplegia; Paraplegia; Seizure Disorder Oncologic Medical History: Negative for: Received Chemotherapy; Received Radiation Past Medical History Notes: skin cancer Psychiatric Medical History: Negative for: Anorexia/bulimia; Confinement Anxiety HBO Extended History Items Eyes: Cataracts Immunizations Pneumococcal Vaccine: Received Pneumococcal Vaccination: Yes Received Pneumococcal Vaccination On or After 60th Birthday: Yes Implantable Devices None Hospitalization / Surgery History Type of Hospitalization/Surgery appendectomy cardiac catheterization cataract extraction cervical fusion CABG TURP cataract laser procedure Family and Social History Cancer: Yes - Paternal Grandparents; Diabetes: Yes - Paternal Grandparents; Heart Disease: No; Hereditary Spherocytosis: No; Hypertension: No; Kidney Disease: Yes - Maternal Grandparents; Lung Disease: No; Seizures: No; Stroke: No; Thyroid Problems: No; Tuberculosis: No; Former smoker - quit 5 years ago; Marital Status - Married; Alcohol Use: Rarely; Drug Use: No History; Caffeine Use: Daily; Financial Concerns: No; Food, Clothing or Shelter Needs: No; Support System Lacking: No; Transportation Concerns: No Electronic Signature(s) Signed: 06/29/2021 11:55:33 AM By: Ivan Shan DO Signed: 07/02/2021 5:43:45 PM By: Baruch Gouty RN, BSN Entered By: Ivan [name] on 06/29/2021 11:51:52 -------------------------------------------------------------------------------- SuperBill Details Patient Name: Date of Service: Ivan [name], Ivan BERT W. 06/29/2021 Medical Record Number: [id number] Patient Account Number: [id number] Date of  Birth/Sex: Treating RN: June 18, 1945 (76 y.o. [name] Primary Care Provider: Dorris [name] Other Clinician: Referring Provider: Treating Provider/Extender: Ivan [name] in Treatment: 2 Diagnosis Coding ICD-10 Codes Code Description [phone] Pressure ulcer of left hip, stage 3 I10 Essential (primary) hypertension M46.28  Osteomyelitis of vertebra, sacral and sacrococcygeal region Facility Procedures CPT4 Code: [id number] Description: 08569 - WOUND CARE VISIT-LEV 3 EST PT Modifier: Quantity: 1 Physician Procedures : CPT4 Code Description Modifier [id number] - WC PHYS LEVEL 3 - EST PT ICD-10 Diagnosis Description L89.223 Pressure ulcer of left hip, stage 3 I10 Essential (primary) hypertension M46.28 Osteomyelitis of vertebra, sacral and sacrococcygeal region Quantity: 1 Electronic Signature(s) Signed: 06/29/2021 11:55:33 AM By: Ivan Shan DO Entered By: Ivan [name] on 06/29/2021 11:55:09

## 2021-07-13 ENCOUNTER — Encounter (HOSPITAL_BASED_OUTPATIENT_CLINIC_OR_DEPARTMENT_OTHER): Payer: Medicare HMO | Attending: Internal Medicine | Admitting: Internal Medicine

## 2021-07-13 ENCOUNTER — Other Ambulatory Visit: Payer: Self-pay

## 2021-07-13 DIAGNOSIS — I10 Essential (primary) hypertension: Secondary | ICD-10-CM | POA: Diagnosis not present

## 2021-07-13 DIAGNOSIS — M4628 Osteomyelitis of vertebra, sacral and sacrococcygeal region: Secondary | ICD-10-CM | POA: Diagnosis not present

## 2021-07-13 DIAGNOSIS — M1990 Unspecified osteoarthritis, unspecified site: Secondary | ICD-10-CM | POA: Insufficient documentation

## 2021-07-13 DIAGNOSIS — L509 Urticaria, unspecified: Secondary | ICD-10-CM | POA: Diagnosis not present

## 2021-07-13 DIAGNOSIS — L89223 Pressure ulcer of left hip, stage 3: Secondary | ICD-10-CM | POA: Diagnosis present

## 2021-07-13 DIAGNOSIS — S7002XA Contusion of left hip, initial encounter: Secondary | ICD-10-CM | POA: Diagnosis not present

## 2021-07-13 NOTE — Progress Notes (Signed)
SOPHIA, CUBERO ([id number]) Visit Report for 07/13/2021 Chief Complaint Document Details Patient Name: Date of Service: Ivan [name]. 07/13/2021 10:45 A M Medical Record Number: [id number] Patient Account Number: [id number] Date of Birth/Sex: Treating RN: 20-Oct-1945 (76 y.o. M) Primary Care Provider: [name] Other Clinician: Referring Provider: Treating Provider/Extender: Kizzie Ide, [name] in Treatment: 4 Information Obtained from: Patient Chief Complaint 07/29/2019; patient is here for review of a fairly substantial wound over the left greater trochanter 07/04/2020; patient returns to clinic for follow-up of the original ulcer over the left greater trochanter 10/24/2020; patient returns to clinic for follow-up of his original ulcer over the left greater trochanter 06/15/2021; patient returns the clinic for original ulcer over the left greater trochanter Electronic Signature(s) Signed: 07/13/2021 11:49:17 AM By: Kalman Shan DO Entered By: Kalman Shan on 07/13/2021 11:27:36 -------------------------------------------------------------------------------- HPI Details Patient Name: Date of Service: Ivan [name], Ivan BERT W. 07/13/2021 10:45 A M Medical Record Number: [id number] Patient Account Number: [id number] Date of Birth/Sex: Treating RN: 1945-07-26 (76 y.o. 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 He required admission to hospital from 1/5 through 1/9 with Covid pneumonia. At that point he apparently left AMA. He was noted to have a left hip bruise at the time of discharge. I suspect this really was a deep tissue injury. He was readmitted from 1/13 through 1//27 with altered mental status  Covid pneumonia and delirium. At that point he had a left hip and thigh wound which had worsened. Felt to be a pressure injury. He was seen by general surgery who recommended normal saline wet-to-dry twice daily he did not have a debridement. After he left at that point he was incapacitated by increasing lumbar pain. An MRI on 2/9 showed discitis and osteomyelitis at L1/2. He asked aspiration of 1 cm fluid however I think this culture negative. He was also noted at this point to have a very large wound over the left greater trochanter. He was taken to the OR by Dr. Marla Roe for debridement with ACell placement. He was placed in a wound VAC. A culture at that point showed Enterococcus and Enterobacter. An MRI on 3/15 showed no evidence of left hip septic arthritis or osteomyelitis. Extensive subcutaneous edema within the visualized soft tissue of the left thigh and prominent left inguinal lymph nodes likely reactive. He was started on IV cefepime and vancomycin for 6 weeks. He was discharged to [facility] facility as the patient. There I gather the wound developed the necrotic surface. He was seen by the wound care doctor in house who correctly pointed out that a wound VAC would not do anything with a necrotic surface. Since then he has been using Santyl The patient is now at home. He was on vancomycin and cefepime. He developed hives and a generalized rash and was seen in the ER on 3/15 by Dr. Cain Sieve recommendation he was changed to meropenem however he also apparently developed hives after this. He says the original hives stopped after the cefepime was stopped and follow-up hives only developed after the meropenem. He is only taken 1 dose of the fear of the meropenem and now is on vancomycin. 3/25; the patient was seen by Dr. Linus Salmons on 3/22. Because of intolerances to meropenem which I communicated with Dr.  Komar about he is now on daptomycin and Cipro until sometime in early April. He seems to be  tolerating the antibiotics well. We are using Santyl back by wet-to-dry dressings. And making some improvement in the general surface of this very large wound 4/8; we are using Santyl back by wet-to-dry. We are making progress in terms of prepping the wound for a wound VAC. Still necrotic material requiring debridement. 4/16; we are using Santyl back by wet-to-dry. Not convinced that his wife is packing this properly there is a 6 cm tunnel at 12:00. We brought this to attention. Still requiring mechanical debridement 4/29; 2-week follow-up. In general the wound is better however there is considerable tunneling tunneling to the greater trochanter itself. There is still some debris in the deeper parts of this wound I did not do mechanical debridement on this today. I am going to change the dressing to quarter strength Dakin's wet-to-dry. We will put a wound VAC through his insurance. His wife has had brain damage from a previous meningioma surgery. She is not comfortable around strangers in her home. The patient is concerned about this 5/18; patient is using quarter strength Dakin's wet to dry. The problem is a deep pressure area over the left greater trochanter. There was some question from her intake nurses that this is actually come in and some question raised about whether he would require a wound VAC or not. The issue with the wound VAC was complicated predominantly by the patient's reluctance to allow people into his home i.e. home health although he tells me he has this mostly "sorted out" 7/1; this is a patient that I have not seen in almost 6 weeks. Apparently canceled several appointments related to family problems deaths in the family etc. He is wife has ophthalmologic surgery next week. I had been trying to get a wound VAC ordered for this deep probing wound over the left greater trochanter but so far I think he is simply doing a Dakin's wet-to-dry and our intake nurse did not think there  was any Dakin's on it. I have a note from Dr. Tommy Medal of infectious disease on 6/30. He has now been off doxycycline and ciprofloxacin for several weeks. He has no undue pain or drainage. He also had discitis and osteomyelitis at L1-L2 with a 1 cm left psoas abscess as well as some increased signal at T10/T11. He is not having any problems related to this. 12/16/2019 on evaluation today patient's wound actually is showing some signs of bit of improvement which is good news. Fortunately there is no signs of active infection at this time which is good news. He tells me that the nurses have been coming to change this 3 times a week and doing a great job. He told the intake nurse that they had only been coming out once a week. I am not really sure what is going on with the discrepancy and stories here. Nonetheless I do believe that he needs to have this changed 3 times a week on a regular basis he does seem to have a little bit of a fungal infection around the wound. 8/19; 2-week follow-up. I have not seen this patient in over 6 weeks. There is been a dramatic improvement in the overall wound volume. Circumference of the wound is now small although there is still considerable undermining from roughly 9-2 o'clock maximum 1:00 at roughly 4.7 cm. There is no purulent drainage. He has some irritation around the wound on the normal  skin. They are using miconazole on this. 9/2; 2-week follow-up. Dimensions of the wound circumference look quite good. There is tunneling at 4.3 cm at 1:00 versus 4.7 last time. The area continues to look like it is contracting with the wound VAC and I think the treatment of choice here will be continuing to the wound VAC as long as it seems practical. There is no evidence of infection. As far as I am aware he is offloading this aggressively 9/16; 2-week follow-up. Left greater trochanter. Dimensions about the same still about 4.9 cm of maximal undermining. Nevertheless the visible  tissue that I can see looks healthy. We have a wound VAC and really from the perspective of how this looks initially this is gotten a lot better however not much change in the last week 9/30 2-week follow-up. Left greater trochanter. He has a tunnel at 12:00 that goes about 7 cm. From 10-12 lesser degrees of undermining. HOWEVER he comes in today telling us that he is going to have to go back to work. He will not be able to work with a wound VAC and he wants that discontinued. There was not much discussion. This is a financial issue for him. He also has erythema around the wound orifice which has satellite lesions around it this is no doubt cutaneous candidiasis 10/12; 2-week follow-up. Left greater trochanter. 7.2 cm of tunneling. 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 He has not been systemically unwell. 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 He is using silver alginate which he is dressing himself, ABDs with a large plastic occlusive dressing. Dimensions are somewhat better although this is an irregular wound. When he was here the last time he had a lot of drainage coming out of this wound. The culture I did showed methicillin sensitive staph aureus. I gave him 10 days of doxycycline and he is just finishing up on this. He states the drainage is a lot better 04/03/2020 upon evaluation today patient appears to be doing well with regard to his wound as far as the overall appearance is concerned the one issue that he is noting is that he does have some green drainage occurring at times. Obviously this brings up the concern for Pseudomonas I will see anything visually right now that has me overly concerned about at the same time that history does concern me. The patient would like to see if we can attempt to treat this today. I think that being that  green drainage is pretty much pathognomonic for a Pseudomonas infection I think he can continue to doxycycline but probably should do a couple weeks of Cipro to try to see if that clears up the drainage if not then we may need to delve into a more extensive culture possibly PCR at that point. With regard to the wound itself also believe based on talking with the patient today that he could be a candidate for a flap surgery that something he would like to consider READMISSION 07/04/2020 Ivan [name] is a 76 year old man we had in clinic until the end of November 2021. He had a substantial wound on the left greater trochanter that required an original IandD by Dr. Marla Roe early in 2021. This was an infected wound and had IV antibiotics directed by infectious disease. He also had an L1-L2 discitis. We  may have him in clinic here we initially put him in a wound VAC and he actually did quite well with this with filling in of the underlying tissue however he had to go back to work. 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 He is still working there 40 hours a week second shift. He was coming to this clinic until late in November I am not exactly sure what happened. I had not referred him to plastic surgery because he would have to inevitably take time off work that he was not prepared to do. The orifice of the wound was too small to really reconsider a wound VAC and that still true. He arrives back in clinic with a direct depth of the wound at 1.5 cm that tunnel goes to 3.1 cm from 3.6 last time. He saw Dr. Johnnye Sima on 04/14/2020. He ordered a follow-up MRI. This showed persistence of a peritrochanteric fluid collection in the bursa which was slightly larger than the previous image. This communicated with the wounds. The patient still says he is having clear fluid drainage and he is changing his dressing twice a day. He is currently using predominantly half-strength Dakin's  wet-to-dry and he correctly notes that the wound is clean and has come in significantly albeit slowly. He has not noticed any systemic symptoms. No real pain. Last measurements of his C-reactive protein and sedimentation rate were 3.7 and 2 respectively. He was supposed to follow-up with Dr. Johnnye Sima I am not sure that he has. Dr. Johnnye Sima had tried to organize an appointment with Dr. Marla Roe I do not think that ever got done either. The patient tells me he is not willing to consider any further surgery to this area until he has short-term disability time which he thinks will happen sometime in May or June 3/22; monthly follow-up. Minimal change in the important measurements of depth at 1.7 and the 12:00 tunnel at 2.6. We have been referred back to Dr. Marla Roe I think by Dr. Johnnye Sima although he does not yet have a plastics appointment 10/24/2020; patient dropped off our schedule when he returns to clinic with for review of the same wound over the left greater trochanter. He says the reason is because of medical issues including TUR of a benign bladder tumor. He also had cataract issues. In any case he has been using a hydrogel wet-to-dry to his left hip wound. He is working full-time this does not limit him. Direct depth today measured at 2.1 with undermining from 8-12 also with 2.1. This represents some improvement. He tells me he never did see Dr. Marla Roe again because he could not coordinate another appointment. He is not really wanting to do the flap surgery type of thing he says this does not inhibit his lifestyle 7/14; 1 month follow-up he is using hydrogel wet-to-dry. The wound has 2 cm of direct depth and a 2.5 cm tunnel at 1:00 I am not sure that this has changed too much. Once again I have talked to him about whether he wants to try a different dressing Hydrofera Blue packing rope, polymen Ag rope etc. He does not seem at all interested in this. Wants to do the hydrogel wet-to-dry  packing for another month 8/25; 1 month follow-up using hydrogel wet-to-dry. I think he is actually done better. Wound is come in since the last time he was here a month ago. He reports no other issues 9/22; he is using hydrogel wet-to-dry predominantly however  recently he changed to Dakin's wet-to-dry because he perceived an odor. Not much change in dimensions per our intake nurses. He has no systemic symptoms. 2/30/2023 Ivan [name] has followed in the clinic over several years for a wound to his left hip. This wound has never closed. He was last seen on 02/01/2021 and was using hydrogel wet-to-dry dressings. He currently has no issues or complaints today. He denies signs of infection. 2/17; patient presents for follow-up. He has no issues or complaints today. He has been using Hydrofera Blue to the wound bed with hydrogel. He denies signs of infection. He has not obtained his left hip x-ray. 3/3; patient presents for follow-up. He reports using Hydrofera Blue and hydrogel but has become more difficult to pack the [facility] into the wound bed. He denies signs of infection. He has not obtained his left hip x-ray. Electronic Signature(s) Signed: 07/13/2021 11:49:17 AM By: Kalman Shan DO Entered By: Kalman Shan on 07/13/2021 11:28:11 -------------------------------------------------------------------------------- Physical Exam Details Patient Name: Date of Service: Ivan [name], Ivan BERT W. 07/13/2021 10:45 A M Medical Record Number: [id number] Patient Account Number: [id number] Date of Birth/Sex: Treating RN: January 19, 1946 (76 y.o. M) Primary Care Provider: [name] Other Clinician: Referring Provider: Treating Provider/Extender: Kizzie Ide, Carina Weeks in Treatment: 4 Constitutional respirations regular, non-labored and within target range for patient.Marland Kitchen Psychiatric pleasant and cooperative. Notes Left hip: Over the bony prominence there is an open wound with  granulation tissue. Some undermining noted. No signs of surrounding infection. Electronic Signature(s) Signed: 07/13/2021 11:49:17 AM By: Kalman Shan DO Entered By: Kalman Shan on 07/13/2021 11:42:19 -------------------------------------------------------------------------------- Physician Orders Details Patient Name: Date of Service: Ivan [name], Ivan BERT W. 07/13/2021 10:45 A M Medical Record Number: [id number] Patient Account Number: [id number] Date of Birth/Sex: Treating RN: October 31, 1945 (76 y.o. [name] Primary Care Provider: [name] Other Clinician: Referring Provider: Treating Provider/Extender: Quillian Quince Weeks in Treatment: 4 Verbal / Phone Orders: No Diagnosis Coding Follow-up Appointments ppointment in 2 weeks. - with Dr. Heber [facility] Return A Bathing/ Shower/ Hygiene May shower and wash wound with soap and water. Off-Loading Other: - Avoid prolonged pressure to area. Additional Orders / Instructions Follow Nutritious Diet Wound Treatment Wound #4 - Trochanter Wound Laterality: Left Cleanser: Soap and Water Every Other Day/15 Days Discharge Instructions: May shower and wash wound with dial antibacterial soap and water prior to dressing change. Cleanser: Wound Cleanser Every Other Day/15 Days Discharge Instructions: Cleanse the wound with wound cleanser prior to applying a clean dressing using gauze sponges, not tissue or cotton balls. Peri-Wound Care: Skin Prep Every Other Day/15 Days Discharge Instructions: Use skin prep as directed Topical: Skintegrity Hydrogel 4 (oz) Every Other Day/15 Days Discharge Instructions: Patient to use Solosite Prim Dressing: Promogran Prisma Matrix, 4.34 (sq in) (silver collagen) (DME) (Generic) Every Other Day/15 Days ary Discharge Instructions: Moisten collagen with saline or hydrogel Secondary Dressing: Zetuvit Plus Silicone Border Dressing 4x4 (in/in) Every Other Day/15 Days Discharge Instructions:  Apply silicone border over primary dressing as directed. Electronic Signature(s) Signed: 07/13/2021 11:49:17 AM By: Kalman Shan DO Entered By: Kalman Shan on 07/13/2021 11:42:35 -------------------------------------------------------------------------------- Problem List Details Patient Name: Date of Service: Ivan [name], Ivan BERT W. 07/13/2021 10:45 A M Medical Record Number: [id number] Patient Account Number: [id number] Date of Birth/Sex: Treating RN: October 14, 1945 (76 y.o. M) Primary Care Provider: [name] Other Clinician: Referring Provider: Treating Provider/Extender: Quillian Quince Weeks in Treatment: 4 Active Problems ICD-10 Encounter Code Description Active Date MDM  Diagnosis L89.223 Pressure ulcer of left hip, stage 3 06/15/2021 No Yes I10 Essential (primary) hypertension 06/15/2021 No Yes M46.28 Osteomyelitis of vertebra, sacral and sacrococcygeal region 06/15/2021 No Yes Inactive Problems Resolved Problems Electronic Signature(s) Signed: 07/13/2021 11:49:17 AM By: Kalman Shan DO Entered By: Kalman Shan on 07/13/2021 11:27:18 -------------------------------------------------------------------------------- Progress Note Details Patient Name: Date of Service: Ivan [name], Ivan BERT W. 07/13/2021 10:45 A M Medical Record Number: [id number] Patient Account Number: [id number] Date of Birth/Sex: Treating RN: May 18, 1945 (76 y.o. M) Primary Care Provider: [name] Other Clinician: Referring Provider: Treating Provider/Extender: Kizzie Ide, [name] in Treatment: 4 Subjective Chief Complaint Information obtained from Patient 07/29/2019; patient is here for review of a fairly substantial wound over the left greater trochanter 07/04/2020; patient returns to clinic for follow-up of the original ulcer over the left greater trochanter 10/24/2020; patient returns to clinic for follow-up of his original ulcer over the left greater trochanter  06/15/2021; patient returns the clinic for original ulcer over the left greater trochanter History of Present Illness (HPI) ADMISSION 07/29/2019 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 He required admission to hospital from 1/5 through 1/9 with Covid pneumonia. At that point he apparently left AMA. He was noted to have a left hip bruise at the time of discharge. I suspect this really was a deep tissue injury. He was readmitted from 1/13 through 1//27 with altered mental status Covid pneumonia and delirium. At that point he had a left hip and thigh wound which had worsened. Felt to be a pressure injury. He was seen by general surgery who recommended normal saline wet-to-dry twice daily he did not have a debridement. After he left at that point he was incapacitated by increasing lumbar pain. An MRI on 2/9 showed discitis and osteomyelitis at L1/2. He asked aspiration of 1 cm fluid however I think this culture negative. He was also noted at this point to have a very large wound over the left greater trochanter. He was taken to the OR by Dr. Marla Roe for debridement with ACell placement. He was placed in a wound VAC. A culture at that point showed Enterococcus and Enterobacter. An MRI on 3/15 showed no evidence of left hip septic arthritis or osteomyelitis. Extensive subcutaneous edema within the visualized soft tissue of the left thigh and prominent left inguinal lymph nodes likely reactive. He was started on IV cefepime and vancomycin for 6 weeks. He was discharged to [facility] facility as the patient. There I gather the wound developed the necrotic surface. He was seen by the wound care doctor in house who correctly pointed out that a wound VAC would not do anything with a necrotic surface. Since then he has been using Santyl The patient is now at home. He was on vancomycin and cefepime. He developed hives and a generalized rash and was seen in the ER on  3/15 by Dr. Cain Sieve recommendation he was changed to meropenem however he also apparently developed hives after this. He says the original hives stopped after the cefepime was stopped and follow-up hives only developed after the meropenem. He is only taken 1 dose of the fear of the meropenem and now is on vancomycin. 3/25; the patient was seen by Dr. Linus Salmons on 3/22. Because of intolerances to meropenem which I communicated with Dr. De Burrs about he is now on daptomycin and Cipro until sometime in early April. He seems to be tolerating the antibiotics well. We are using Santyl  back by wet-to-dry dressings. And making some improvement in the general surface of this very large wound 4/8; we are using Santyl back by wet-to-dry. We are making progress in terms of prepping the wound for a wound VAC. Still necrotic material requiring debridement. 4/16; we are using Santyl back by wet-to-dry. Not convinced that his wife is packing this properly there is a 6 cm tunnel at 12:00. We brought this to attention. Still requiring mechanical debridement 4/29; 2-week follow-up. In general the wound is better however there is considerable tunneling tunneling to the greater trochanter itself. There is still some debris in the deeper parts of this wound I did not do mechanical debridement on this today. I am going to change the dressing to quarter strength Dakin's wet-to-dry. We will put a wound VAC through his insurance. His wife has had brain damage from a previous meningioma surgery. She is not comfortable around strangers in her home. The patient is concerned about this 5/18; patient is using quarter strength Dakin's wet to dry. The problem is a deep pressure area over the left greater trochanter. There was some question from her intake nurses that this is actually come in and some question raised about whether he would require a wound VAC or not. The issue with the wound VAC was complicated predominantly by the  patient's reluctance to allow people into his home i.e. home health although he tells me he has this mostly "sorted out" 7/1; this is a patient that I have not seen in almost 6 weeks. Apparently canceled several appointments related to family problems deaths in the family etc. He is wife has ophthalmologic surgery next week. I had been trying to get a wound VAC ordered for this deep probing wound over the left greater trochanter but so far I think he is simply doing a Dakin's wet-to-dry and our intake nurse did not think there was any Dakin's on it. I have a note from Dr. Tommy Medal of infectious disease on 6/30. He has now been off doxycycline and ciprofloxacin for several weeks. He has no undue pain or drainage. He also had discitis and osteomyelitis at L1-L2 with a 1 cm left psoas abscess as well as some increased signal at T10/T11. He is not having any problems related to this. 12/16/2019 on evaluation today patient's wound actually is showing some signs of bit of improvement which is good news. Fortunately there is no signs of active infection at this time which is good news. He tells me that the nurses have been coming to change this 3 times a week and doing a great job. He told the intake nurse that they had only been coming out once a week. I am not really sure what is going on with the discrepancy and stories here. Nonetheless I do believe that he needs to have this changed 3 times a week on a regular basis he does seem to have a little bit of a fungal infection around the wound. 8/19; 2-week follow-up. I have not seen this patient in over 6 weeks. There is been a dramatic improvement in the overall wound volume. Circumference of the wound is now small although there is still considerable undermining from roughly 9-2 o'clock maximum 1:00 at roughly 4.7 cm. There is no purulent drainage. He has some irritation around the wound on the normal skin. They are using miconazole on this. 9/2; 2-week  follow-up. Dimensions of the wound circumference look quite good. There is tunneling at 4.3 cm at 1:00  versus 4.7 last time. The area continues to look like it is contracting with the wound VAC and I think the treatment of choice here will be continuing to the wound VAC as long as it seems practical. There is no evidence of infection. As far as I am aware he is offloading this aggressively 9/16; 2-week follow-up. Left greater trochanter. Dimensions about the same still about 4.9 cm of maximal undermining. Nevertheless the visible tissue that I can see looks healthy. We have a wound VAC and really from the perspective of how this looks initially this is gotten a lot better however not much change in the last week 9/30 2-week follow-up. Left greater trochanter. He has a tunnel at 12:00 that goes about 7 cm. From 10-12 lesser degrees of undermining. HOWEVER he comes in today telling us that he is going to have to go back to work. He will not be able to work with a wound VAC and he wants that discontinued. There was not much discussion. This is a financial issue for him. He also has erythema around the wound orifice which has satellite lesions around it this is no doubt cutaneous candidiasis 10/12; 2-week follow-up. Left greater trochanter. 7.2 cm of tunneling. 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 He has not been systemically unwell. 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 He is using silver alginate which he is dressing himself, ABDs with a large plastic occlusive dressing. Dimensions are somewhat better although this is an irregular wound. When he was here the last time he had a lot of drainage coming out of this wound. The culture I did showed methicillin sensitive staph aureus. I gave him 10 days of doxycycline and he is just finishing up on this. He  states the drainage is a lot better 04/03/2020 upon evaluation today patient appears to be doing well with regard to his wound as far as the overall appearance is concerned the one issue that he is noting is that he does have some green drainage occurring at times. Obviously this brings up the concern for Pseudomonas I will see anything visually right now that has me overly concerned about at the same time that history does concern me. The patient would like to see if we can attempt to treat this today. I think that being that green drainage is pretty much pathognomonic for a Pseudomonas infection I think he can continue to doxycycline but probably should do a couple weeks of Cipro to try to see if that clears up the drainage if not then we may need to delve into a more extensive culture possibly PCR at that point. With regard to the wound itself also believe based on talking with the patient today that he could be a candidate for a flap surgery that something he would like to consider READMISSION 07/04/2020 Ivan [name] is a 76 year old man we had in clinic until the end of November 2021. He had a substantial wound on the left greater trochanter that required an original IandD by Dr. Marla Roe early in 2021. This was an infected wound and had IV antibiotics directed by infectious disease. He also had an L1-L2 discitis. We may have him in clinic here we initially put him in a wound VAC and he actually did quite well with this with filling in of the  underlying tissue however he had to go back to work. 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 He is still working there 40 hours a week second shift. He was coming to this clinic until late in November I am not exactly sure what happened. I had not referred him to plastic surgery because he would have to inevitably take time off work that he was not prepared to do. The orifice of the wound was too small to really reconsider a wound  VAC and that still true. He arrives back in clinic with a direct depth of the wound at 1.5 cm that tunnel goes to 3.1 cm from 3.6 last time. He saw Dr. Johnnye Sima on 04/14/2020. He ordered a follow-up MRI. This showed persistence of a peritrochanteric fluid collection in the bursa which was slightly larger than the previous image. This communicated with the wounds. The patient still says he is having clear fluid drainage and he is changing his dressing twice a day. He is currently using predominantly half-strength Dakin's wet-to-dry and he correctly notes that the wound is clean and has come in significantly albeit slowly. He has not noticed any systemic symptoms. No real pain. Last measurements of his C-reactive protein and sedimentation rate were 3.7 and 2 respectively. He was supposed to follow-up with Dr. Johnnye Sima I am not sure that he has. Dr. Johnnye Sima had tried to organize an appointment with Dr. Marla Roe I do not think that ever got done either. The patient tells me he is not willing to consider any further surgery to this area until he has short-term disability time which he thinks will happen sometime in May or June 3/22; monthly follow-up. Minimal change in the important measurements of depth at 1.7 and the 12:00 tunnel at 2.6. We have been referred back to Dr. Marla Roe I think by Dr. Johnnye Sima although he does not yet have a plastics appointment 10/24/2020; patient dropped off our schedule when he returns to clinic with for review of the same wound over the left greater trochanter. He says the reason is because of medical issues including TUR of a benign bladder tumor. He also had cataract issues. In any case he has been using a hydrogel wet-to-dry to his left hip wound. He is working full-time this does not limit him. Direct depth today measured at 2.1 with undermining from 8-12 also with 2.1. This represents some improvement. He tells me he never did see Dr. Marla Roe again because he could  not coordinate another appointment. He is not really wanting to do the flap surgery type of thing he says this does not inhibit his lifestyle 7/14; 1 month follow-up he is using hydrogel wet-to-dry. The wound has 2 cm of direct depth and a 2.5 cm tunnel at 1:00 I am not sure that this has changed too much. Once again I have talked to him about whether he wants to try a different dressing Hydrofera Blue packing rope, polymen Ag rope etc. He does not seem at all interested in this. Wants to do the hydrogel wet-to-dry packing for another month 8/25; 1 month follow-up using hydrogel wet-to-dry. I think he is actually done better. Wound is come in since the last time he was here a month ago. He reports no other issues 9/22; he is using hydrogel wet-to-dry predominantly however recently he changed to Dakin's wet-to-dry because he perceived an odor. Not much change in dimensions per our intake nurses. He has no systemic symptoms. 2/30/2023 Mr.  Jessiah [name] has followed in the clinic over several years for a wound to his left hip. This wound has never closed. He was last seen on 02/01/2021 and was using hydrogel wet-to-dry dressings. He currently has no issues or complaints today. He denies signs of infection. 2/17; patient presents for follow-up. He has no issues or complaints today. He has been using Hydrofera Blue to the wound bed with hydrogel. He denies signs of infection. He has not obtained his left hip x-ray. 3/3; patient presents for follow-up. He reports using Hydrofera Blue and hydrogel but has become more difficult to pack the [facility] into the wound bed. He denies signs of infection. He has not obtained his left hip x-ray. Patient History Information obtained from Patient. Family History Cancer - Paternal Grandparents, Diabetes - Paternal Grandparents, Kidney Disease - Maternal Grandparents, No family history of Heart Disease, Hereditary Spherocytosis, Hypertension, Lung Disease,  Seizures, Stroke, Thyroid Problems, Tuberculosis. Social History Former smoker - quit 5 years ago, Marital Status - Married, Alcohol Use - Rarely, Drug Use - No History, Caffeine Use - Daily. Medical History Eyes Patient has history of Cataracts Denies history of Glaucoma, Optic Neuritis Ear/Nose/Mouth/Throat Denies history of Chronic sinus problems/congestion, Middle ear problems Hematologic/Lymphatic Denies history of Anemia, Hemophilia, Human Immunodeficiency Virus, Lymphedema, Sickle Cell Disease Respiratory Denies history of Aspiration, Asthma, Chronic Obstructive Pulmonary Disease (COPD), Pneumothorax, Sleep Apnea, Tuberculosis Cardiovascular Patient has history of Coronary Artery Disease, Hypertension, Myocardial Infarction Gastrointestinal Denies history of Cirrhosis , Colitis, Crohnoos, Hepatitis A, Hepatitis B, Hepatitis C Endocrine Denies history of Type I Diabetes, Type II Diabetes Genitourinary Denies history of End Stage Renal Disease Immunological Denies history of Lupus Erythematosus, Raynaudoos, Scleroderma Integumentary (Skin) Denies history of History of Burn Musculoskeletal Patient has history of Osteoarthritis, Osteomyelitis Denies history of Gout, Rheumatoid Arthritis Neurologic Denies history of Dementia, Neuropathy, Quadriplegia, Paraplegia, Seizure Disorder Oncologic Denies history of Received Chemotherapy, Received Radiation Psychiatric Denies history of Anorexia/bulimia, Confinement Anxiety Hospitalization/Surgery History - appendectomy. - cardiac catheterization. - cataract extraction. - cervical fusion. - CABG. - TURP. - cataract laser procedure. Medical A Surgical History Notes nd Endocrine testosterone deficiency Genitourinary BPH Oncologic skin cancer Objective Constitutional respirations regular, non-labored and within target range for patient.. Vitals Time Taken: 11:00 AM, Temperature: 98.6 F, Pulse: 74 bpm, Respiratory Rate: 20  breaths/min, Blood Pressure: 145/74 mmHg. Psychiatric pleasant and cooperative. General Notes: Left hip: Over the bony prominence there is an open wound with granulation tissue. Some undermining noted. No signs of surrounding infection. Integumentary (Hair, Skin) Wound #4 status is Open. Original cause of wound was Pressure Injury. The date acquired was: 05/31/2019. The wound has been in treatment 4 weeks. The wound is located on the Left Trochanter. The wound measures 0.4cm length x 0.2cm width x 1cm depth; 0.063cm^2 area and 0.063cm^3 volume. There is Fat Layer (Subcutaneous Tissue) exposed. There is no tunneling noted, however, there is undermining starting at 12:00 and ending at 12:00 with a maximum distance of 0.7cm. There is a medium amount of serosanguineous drainage noted. The wound margin is well defined and not attached to the wound base. There is large (67-100%) red, pink granulation within the wound bed. There is a small (1-33%) amount of necrotic tissue within the wound bed including Adherent Slough. Assessment Active Problems ICD-10 Pressure ulcer of left hip, stage 3 Essential (primary) hypertension Osteomyelitis of vertebra, sacral and sacrococcygeal region Patient's wound is stable with some improvement in appearance. Since the area is difficult to pack I recommended using  collagen with hydrogel every other day. There is no surrounding signs of infection. Plan Follow-up Appointments: Return Appointment in 2 weeks. - with Dr. Heber [place] Bathing/ Shower/ Hygiene: May shower and wash wound with soap and water. Off-Loading: Other: - Avoid prolonged pressure to area. Additional Orders / Instructions: Follow Nutritious Diet WOUND #4: - Trochanter Wound Laterality: Left Cleanser: Soap and Water Every Other Day/15 Days Discharge Instructions: May shower and wash wound with dial antibacterial soap and water prior to dressing change. Cleanser: Wound Cleanser Every Other Day/15  Days Discharge Instructions: Cleanse the wound with wound cleanser prior to applying a clean dressing using gauze sponges, not tissue or cotton balls. Peri-Wound Care: Skin Prep Every Other Day/15 Days Discharge Instructions: Use skin prep as directed Topical: Skintegrity Hydrogel 4 (oz) Every Other Day/15 Days Discharge Instructions: Patient to use Solosite Prim Dressing: Promogran Prisma Matrix, 4.34 (sq in) (silver collagen) (DME) (Generic) Every Other Day/15 Days ary Discharge Instructions: Moisten collagen with saline or hydrogel Secondary Dressing: Zetuvit Plus Silicone Border Dressing 4x4 (in/in) Every Other Day/15 Days Discharge Instructions: Apply silicone border over primary dressing as directed. 1. Collagen with hydrogel 2. Follow-up in 2 weeks Electronic Signature(s) Signed: 07/13/2021 11:49:17 AM By: Kalman Shan DO Entered By: Kalman Shan on 07/13/2021 11:48:29 -------------------------------------------------------------------------------- HxROS Details Patient Name: Date of Service: Ivan [name], Ivan BERT W. 07/13/2021 10:45 A M Medical Record Number: [id number] Patient Account Number: [id number] Date of Birth/Sex: Treating RN: 1946-01-20 (76 y.o. M) Primary Care Provider: [name] Other Clinician: Referring Provider: Treating Provider/Extender: Quillian Quince Weeks in Treatment: 4 Information Obtained From Patient Eyes Medical History: Positive for: Cataracts Negative for: Glaucoma; Optic Neuritis Ear/Nose/Mouth/Throat Medical History: Negative for: Chronic sinus problems/congestion; Middle ear problems Hematologic/Lymphatic Medical History: Negative for: Anemia; Hemophilia; Human Immunodeficiency Virus; Lymphedema; Sickle Cell Disease Respiratory Medical History: Negative for: Aspiration; Asthma; Chronic Obstructive Pulmonary Disease (COPD); Pneumothorax; Sleep Apnea; Tuberculosis Cardiovascular Medical History: Positive for:  Coronary Artery Disease; Hypertension; Myocardial Infarction Gastrointestinal Medical History: Negative for: Cirrhosis ; Colitis; Crohns; Hepatitis A; Hepatitis B; Hepatitis C Endocrine Medical History: Negative for: Type I Diabetes; Type II Diabetes Past Medical History Notes: testosterone deficiency Genitourinary Medical History: Negative for: End Stage Renal Disease Past Medical History Notes: BPH Immunological Medical History: Negative for: Lupus Erythematosus; Raynauds; Scleroderma Integumentary (Skin) Medical History: Negative for: History of Burn Musculoskeletal Medical History: Positive for: Osteoarthritis; Osteomyelitis Negative for: Gout; Rheumatoid Arthritis Neurologic Medical History: Negative for: Dementia; Neuropathy; Quadriplegia; Paraplegia; Seizure Disorder Oncologic Medical History: Negative for: Received Chemotherapy; Received Radiation Past Medical History Notes: skin cancer Psychiatric Medical History: Negative for: Anorexia/bulimia; Confinement Anxiety HBO Extended History Items Eyes: Cataracts Immunizations Pneumococcal Vaccine: Received Pneumococcal Vaccination: Yes Received Pneumococcal Vaccination On or After 60th Birthday: Yes Implantable Devices None Hospitalization / Surgery History Type of Hospitalization/Surgery appendectomy cardiac catheterization cataract extraction cervical fusion CABG TURP cataract laser procedure Family and Social History Cancer: Yes - Paternal Grandparents; Diabetes: Yes - Paternal Grandparents; Heart Disease: No; Hereditary Spherocytosis: No; Hypertension: No; Kidney Disease: Yes - Maternal Grandparents; Lung Disease: No; Seizures: No; Stroke: No; Thyroid Problems: No; Tuberculosis: No; Former smoker - quit 5 years ago; Marital Status - Married; Alcohol Use: Rarely; Drug Use: No History; Caffeine Use: Daily; Financial Concerns: No; Food, Clothing or Shelter Needs: No; Support System Lacking: No;  Transportation Concerns: No Electronic Signature(s) Signed: 07/13/2021 11:49:17 AM By: Kalman Shan DO Entered By: Kalman Shan on 07/13/2021 11:40:44 -------------------------------------------------------------------------------- SuperBill Details Patient Name: Date of Service: Ivan [name], Ivan BERT  W. 07/13/2021 Medical Record Number: [id number] Patient Account Number: [id number] Date of Birth/Sex: Treating RN: 10-29-1945 (77 y.o. [name], Lauren Primary Care Provider: [name] Other Clinician: Referring Provider: Treating Provider/Extender: Quillian Quince Weeks in Treatment: 4 Diagnosis Coding ICD-10 Codes Code Description [phone] Pressure ulcer of left hip, stage 3 I10 Essential (primary) hypertension M46.28 Osteomyelitis of vertebra, sacral and sacrococcygeal region Facility Procedures CPT4 Code: [id number] Description: 99213 - WOUND CARE VISIT-LEV 3 EST PT Modifier: Quantity: 1 Physician Procedures : CPT4 Code Description Modifier [id number] - WC PHYS LEVEL 3 - EST PT ICD-10 Diagnosis Description L89.223 Pressure ulcer of left hip, stage 3 I10 Essential (primary) hypertension M46.28 Osteomyelitis of vertebra, sacral and sacrococcygeal region Quantity: 1 Electronic Signature(s) Signed: 07/13/2021 11:49:17 AM By: Kalman Shan DO Entered By: Kalman Shan on 07/13/2021 11:48:47

## 2021-07-13 NOTE — Progress Notes (Addendum)
MALIN, CERVINI ([id number]) Visit Report for 07/13/2021 Arrival Information Details Patient Name: Date of Service: [name] 07/13/2021 10:45 A M Medical Record Number: [id number] Patient Account Number: [id number] Date of Birth/Sex: Treating RN: Feb 22, 1946 (76 y.o. [name], Lauren Primary Care Offie Waide: [name] Other Clinician: Referring Tyri Elmore: Treating Anarosa Kubisiak/Extender: Quillian Quince Weeks in Treatment: 4 Visit Information History Since Last Visit Added or deleted any medications: No Patient Arrived: Ambulatory Any new allergies or adverse reactions: No Arrival Time: 10:52 Had a fall or experienced change in No Accompanied By: self activities of daily living that may affect Transfer Assistance: None risk of falls: Patient Identification Verified: Yes Signs or symptoms of abuse/neglect since last visito No Secondary Verification Process Completed: Yes Hospitalized since last visit: No Patient Requires Transmission-Based Precautions: No Implantable device outside of the clinic excluding No Patient Has Alerts: Yes cellular tissue based products placed in the center Patient Alerts: Patient on Blood Thinner since last visit: Has Dressing in Place as Prescribed: Yes Pain Present Now: No Electronic Signature(s) Signed: 07/13/2021 12:09:10 PM By: Rhae Hammock RN Entered By: Rhae Hammock on 07/13/2021 11:00:09 -------------------------------------------------------------------------------- Clinic Level of Care Assessment Details Patient Name: Date of Service: [name], [name] 07/13/2021 10:45 A M Medical Record Number: [id number] Patient Account Number: [id number] Date of Birth/Sex: Treating RN: 1946/04/03 (76 y.o. [name], Lauren Primary Care Kayliah Tindol: [name] Other Clinician: Referring Junella Domke: Treating Jesicca Dipierro/Extender: Quillian Quince Weeks in Treatment: 4 Clinic Level of Care Assessment  Items TOOL 4 Quantity Score X- 1 0 Use when only an EandM is performed on FOLLOW-UP visit ASSESSMENTS - Nursing Assessment / Reassessment X- 1 10 Reassessment of Co-morbidities (includes updates in patient status) X- 1 5 Reassessment of Adherence to Treatment Plan ASSESSMENTS - Wound and Skin A ssessment / Reassessment X - Simple Wound Assessment / Reassessment - one wound 1 5 []  - 0 Complex Wound Assessment / Reassessment - multiple wounds []  - 0 Dermatologic / Skin Assessment (not related to wound area) ASSESSMENTS - Focused Assessment []  - 0 Circumferential Edema Measurements - multi extremities []  - 0 Nutritional Assessment / Counseling / Intervention []  - 0 Lower Extremity Assessment (monofilament, tuning fork, pulses) []  - 0 Peripheral Arterial Disease Assessment (using hand held doppler) ASSESSMENTS - Ostomy and/or Continence Assessment and Care []  - 0 Incontinence Assessment and Management []  - 0 Ostomy Care Assessment and Management (repouching, etc.) PROCESS - Coordination of Care X - Simple Patient / Family Education for ongoing care 1 15 []  - 0 Complex (extensive) Patient / Family Education for ongoing care X- 1 10 Staff obtains Programmer, systems, Records, T Results / Process Orders est []  - 0 Staff telephones HHA, Nursing Homes / Clarify orders / etc []  - 0 Routine Transfer to another Facility (non-emergent condition) []  - 0 Routine Hospital Admission (non-emergent condition) []  - 0 New Admissions / Biomedical engineer / Ordering NPWT Apligraf, etc. , []  - 0 Emergency Hospital Admission (emergent condition) X- 1 10 Simple Discharge Coordination []  - 0 Complex (extensive) Discharge Coordination PROCESS - Special Needs []  - 0 Pediatric / Minor Patient Management []  - 0 Isolation Patient Management []  - 0 Hearing / Language / Visual special needs []  - 0 Assessment of Community assistance (transportation, D/C planning, etc.) []  - 0 Additional  assistance / Altered mentation []  - 0 Support Surface(s) Assessment (bed, cushion, seat, etc.) INTERVENTIONS - Wound Cleansing / Measurement X - Simple Wound Cleansing - one wound 1 5 []  -  0 Complex Wound Cleansing - multiple wounds X- 1 5 Wound Imaging (photographs - any number of wounds) []  - 0 Wound Tracing (instead of photographs) X- 1 5 Simple Wound Measurement - one wound []  - 0 Complex Wound Measurement - multiple wounds INTERVENTIONS - Wound Dressings X - Small Wound Dressing one or multiple wounds 1 10 []  - 0 Medium Wound Dressing one or multiple wounds []  - 0 Large Wound Dressing one or multiple wounds X- 1 5 Application of Medications - topical []  - 0 Application of Medications - injection INTERVENTIONS - Miscellaneous []  - 0 External ear exam []  - 0 Specimen Collection (cultures, biopsies, blood, body fluids, etc.) []  - 0 Specimen(s) / Culture(s) sent or taken to Lab for analysis []  - 0 Patient Transfer (multiple staff / Civil Service fast streamer / Similar devices) []  - 0 Simple Staple / Suture removal (25 or less) []  - 0 Complex Staple / Suture removal (26 or more) []  - 0 Hypo / Hyperglycemic Management (close monitor of Blood Glucose) []  - 0 Ankle / Brachial Index (ABI) - do not check if billed separately X- 1 5 Vital Signs Has the patient been seen at the hospital within the last three years: Yes Total Score: 90 Level Of Care: New/Established - Level 3 Electronic Signature(s) Signed: 07/13/2021 12:09:10 PM By: Rhae Hammock RN Entered By: Rhae Hammock on 07/13/2021 11:11:07 -------------------------------------------------------------------------------- Encounter Discharge Information Details Patient Name: Date of Service: [name], [name] 07/13/2021 10:45 A M Medical Record Number: [id number] Patient Account Number: [id number] Date of Birth/Sex: Treating RN: 1945-06-26 (76 y.o. [name] Primary Care Kira Hartl: [name] Other  Clinician: Referring Marrissa Dai: Treating Norris Bodley/Extender: Quillian Quince Weeks in Treatment: 4 Encounter Discharge Information Items Discharge Condition: Stable Ambulatory Status: Ambulatory Discharge Destination: Home Transportation: Private Auto Accompanied By: self Schedule Follow-up Appointment: Yes Clinical Summary of Care: Patient Declined Electronic Signature(s) Signed: 07/13/2021 12:09:10 PM By: Rhae Hammock RN Entered By: Rhae Hammock on 07/13/2021 11:11:45 -------------------------------------------------------------------------------- Lower Extremity Assessment Details Patient Name: Date of Service: [name], [name] 07/13/2021 10:45 A M Medical Record Number: [id number] Patient Account Number: [id number] Date of Birth/Sex: Treating RN: September 01, 1945 (76 y.o. [name] Primary Care Korban Shearer: [name] Other Clinician: Referring Halim Surrette: Treating Zeya Balles/Extender: Quillian Quince Weeks in Treatment: 4 Electronic Signature(s) Signed: 07/13/2021 12:09:10 PM By: Rhae Hammock RN Entered By: Rhae Hammock on 07/13/2021 11:00:51 -------------------------------------------------------------------------------- Multi Wound Chart Details Patient Name: Date of Service: [name], [name] 07/13/2021 10:45 A M Medical Record Number: [id number] Patient Account Number: [id number] Date of Birth/Sex: Treating RN: 06/03/1945 (76 y.o. M) Primary Care Jachin Coury: [name] Other Clinician: Referring Triva Hueber: Treating Faisal Stradling/Extender: Kizzie Ide, Carina Weeks in Treatment: 4 Vital Signs Height(in): Pulse(bpm): 63 Weight(lbs): Blood Pressure(mmHg): 145/74 Body Mass Index(BMI): Temperature(F): 98.6 Respiratory Rate(breaths/min): 20 Photos: [N/A:N/A] Left Trochanter N/A N/A Wound Location: Pressure Injury N/A N/A Wounding Event: Pressure Ulcer N/A N/A Primary Etiology: Cataracts, Coronary Artery  Disease, N/A N/A Comorbid History: Hypertension, Myocardial Infarction, Osteoarthritis, Osteomyelitis 05/31/2019 N/A N/A Date Acquired: 4 N/A N/A Weeks of Treatment: Open N/A N/A Wound Status: No N/A N/A Wound Recurrence: 0.4x0.2x1 N/A N/A Measurements L x W x D (cm) 0.063 N/A N/A A (cm) : rea 0.063 N/A N/A Volume (cm) : 73.30% N/A N/A % Reduction in A rea: 70.30% N/A N/A % Reduction in Volume: 12 Starting Position 1 (o'clock): 12 Ending Position 1 (o'clock): 0.7 Maximum Distance 1 (cm): Yes N/A N/A Undermining: Category/Stage III  N/A N/A Classification: Medium N/A N/A Exudate A mount: Serosanguineous N/A N/A Exudate Type: red, brown N/A N/A Exudate Color: Well defined, not attached N/A N/A Wound Margin: Large (67-100%) N/A N/A Granulation A mount: Red, Pink N/A N/A Granulation Quality: Small (1-33%) N/A N/A Necrotic A mount: Fat Layer (Subcutaneous Tissue): Yes N/A N/A Exposed Structures: Fascia: No Tendon: No Muscle: No Joint: No Bone: No Large (67-100%) N/A N/A Epithelialization: Treatment Notes Wound #4 (Trochanter) Wound Laterality: Left Cleanser Soap and Water Discharge Instruction: May shower and wash wound with dial antibacterial soap and water prior to dressing change. Wound Cleanser Discharge Instruction: Cleanse the wound with wound cleanser prior to applying a clean dressing using gauze sponges, not tissue or cotton balls. Peri-Wound Care Skin Prep Discharge Instruction: Use skin prep as directed Topical Skintegrity Hydrogel 4 (oz) Discharge Instruction: Patient to use Solosite Primary Dressing Promogran Prisma Matrix, 4.34 (sq in) (silver collagen) Discharge Instruction: Moisten collagen with saline or hydrogel Secondary Dressing Zetuvit Plus Silicone Border Dressing 4x4 (in/in) Discharge Instruction: Apply silicone border over primary dressing as directed. Secured With Compression Wrap Compression  Stockings Add-Ons Electronic Signature(s) Signed: 07/13/2021 11:49:17 AM By: Kalman Shan DO Entered By: Kalman Shan on 07/13/2021 11:27:26 -------------------------------------------------------------------------------- Multi-Disciplinary Care Plan Details Patient Name: Date of Service: [name], [name] 07/13/2021 10:45 A M Medical Record Number: [id number] Patient Account Number: [id number] Date of Birth/Sex: Treating RN: 10-07-1945 (76 y.o. [name] Primary Care Bensyn Bornemann: [name] Other Clinician: Referring Raymir Frommelt: Treating Fortunato Nordin/Extender: Quillian Quince Weeks in Treatment: 4 Active Inactive Electronic Signature(s) Signed: 11/14/2021 4:27:33 PM By: Rhae Hammock RN Previous Signature: 07/13/2021 12:09:10 PM Version By: Rhae Hammock RN Entered By: Rhae Hammock on 09/21/2021 14:49:17 -------------------------------------------------------------------------------- Pain Assessment Details Patient Name: Date of Service: [name], [name] 07/13/2021 10:45 A M Medical Record Number: [id number] Patient Account Number: [id number] Date of Birth/Sex: Treating RN: [birth year] (76 y.o. [name], Lauren Primary Care Keesha Pellum: [name] Other Clinician: Referring Saharra Santo: Treating Keyasha Miah/Extender: Quillian Quince Weeks in Treatment: 4 Active Problems Location of Pain Severity and Description of Pain Patient Has Paino No Site Locations Pain Management and Medication Current Pain Management: Electronic Signature(s) Signed: 07/13/2021 12:09:10 PM By: Rhae Hammock RN Entered By: Rhae Hammock on 07/13/2021 11:00:46 -------------------------------------------------------------------------------- Patient/Caregiver Education Details Patient Name: Date of Service: [place] M, [name] 3/3/2023andnbsp10:45 A M Medical Record Number: [id number] Patient Account Number: [id number] Date of  Birth/Gender: Treating RN: [birth year] (76 y.o. [name] Primary Care Physician: [name] Other Clinician: Referring Physician: Treating Physician/Extender: Tye Savoy in Treatment: 4 Education Assessment Education Provided To: Patient Education Topics Provided Wound/Skin Impairment: Methods: Explain/Verbal Responses: Reinforcements needed, State content correctly Electronic Signature(s) Signed: 07/13/2021 12:09:10 PM By: Rhae Hammock RN Entered By: Rhae Hammock on 07/13/2021 11:10:18 -------------------------------------------------------------------------------- Wound Assessment Details Patient Name: Date of Service: [name], [name] 07/13/2021 10:45 A M Medical Record Number: [id number] Patient Account Number: [id number] Date of Birth/Sex: Treating RN: June 04, 1945 (76 y.o. [name], Lauren Primary Care Tarance Balan: [name] Other Clinician: Referring Heily Carlucci: Treating Ashlyn Cabler/Extender: Quillian Quince Weeks in Treatment: 4 Wound Status Wound Number: 4 Primary Pressure Ulcer Etiology: Wound Location: Left Trochanter Wound Open Wounding Event: Pressure Injury Status: Date Acquired: 05/31/2019 Comorbid Cataracts, Coronary Artery Disease, Hypertension, Myocardial Weeks Of Treatment: 4 History: Infarction, Osteoarthritis, Osteomyelitis Clustered Wound: No Photos Wound Measurements Length: (cm) 0.4 Width: (cm) 0.2 Depth: (cm) 1 Area: (cm) 0.063 Volume: (cm) 0.063 % Reduction  in Area: 73.3% % Reduction in Volume: 70.3% Epithelialization: Large (67-100%) Tunneling: No Undermining: Yes Starting Position (o'clock): 12 Ending Position (o'clock): 12 Maximum Distance: (cm) 0.7 Wound Description Classification: Category/Stage III Wound Margin: Well defined, not attached Exudate Amount: Medium Exudate Type: Serosanguineous Exudate Color: red, brown Foul Odor After Cleansing:  No Slough/Fibrino Yes Wound Bed Granulation Amount: Large (67-100%) Exposed Structure Granulation Quality: Red, Pink Fascia Exposed: No Necrotic Amount: Small (1-33%) Fat Layer (Subcutaneous Tissue) Exposed: Yes Necrotic Quality: Adherent Slough Tendon Exposed: No Muscle Exposed: No Joint Exposed: No Bone Exposed: No Electronic Signature(s) Signed: 07/13/2021 12:09:10 PM By: Rhae Hammock RN Entered By: Rhae Hammock on 07/13/2021 11:02:35 -------------------------------------------------------------------------------- Vitals Details Patient Name: Date of Service: [name], [name] 07/13/2021 10:45 A M Medical Record Number: [id number] Patient Account Number: [id number] Date of Birth/Sex: Treating RN: 1945-05-28 (76 y.o. [name] Primary Care Sherre Wooton: [name] Other Clinician: Referring Antonis Lor: Treating Jennet Scroggin/Extender: Quillian Quince Weeks in Treatment: 4 Vital Signs Time Taken: 11:00 Temperature (F): 98.6 Pulse (bpm): 74 Respiratory Rate (breaths/min): 20 Blood Pressure (mmHg): 145/74 Reference Range: 80 - 120 mg / dl Electronic Signature(s) Signed: 07/13/2021 12:09:10 PM By: Rhae Hammock RN Entered By: Rhae Hammock on 07/13/2021 11:00:39

## 2021-07-27 ENCOUNTER — Ambulatory Visit (HOSPITAL_BASED_OUTPATIENT_CLINIC_OR_DEPARTMENT_OTHER): Payer: Medicare HMO | Admitting: Internal Medicine

## 2021-08-03 ENCOUNTER — Encounter (HOSPITAL_BASED_OUTPATIENT_CLINIC_OR_DEPARTMENT_OTHER): Payer: Medicare HMO | Admitting: Internal Medicine

## 2021-08-31 ENCOUNTER — Other Ambulatory Visit: Payer: Self-pay | Admitting: Cardiovascular Disease

## 2021-10-11 ENCOUNTER — Other Ambulatory Visit: Payer: Self-pay

## 2021-10-11 MED ORDER — AMLODIPINE BESYLATE 5 MG PO TABS: 5.0000 mg | ORAL_TABLET | Freq: Every day | ORAL | 0 refills | Status: DC

## 2021-10-12 ENCOUNTER — Telehealth: Payer: Self-pay | Admitting: Cardiovascular Disease

## 2021-10-12 MED ORDER — CARVEDILOL 12.5 MG PO TABS: ORAL_TABLET | ORAL | 0 refills | Status: DC

## 2021-10-12 MED ORDER — AMLODIPINE BESYLATE 5 MG PO TABS: 5.0000 mg | ORAL_TABLET | Freq: Every day | ORAL | 0 refills | Status: DC

## 2021-10-12 NOTE — Telephone Encounter (Signed)
Pt's medication was sent to pt's pharmacy as requested. Confirmation received.  °

## 2021-10-12 NOTE — Telephone Encounter (Signed)
*  STAT* If patient is at the pharmacy, call can be transferred to refill team.   1. Which medications need to be refilled? (please list name of each medication and dose if known) amLODipine (NORVASC) 5 MG tablet  carvedilol (COREG) 12.5 MG tablet  2. Which pharmacy/location (including street and city if local pharmacy) is medication to be sent to? [id number]- [place], [place] - [address]  3. Do they need a 30 day or 90 day supply? 90  Patient has appt coming up on 6/26. Please advise

## 2021-10-16 ENCOUNTER — Encounter: Payer: Self-pay | Admitting: *Deleted

## 2021-10-17 ENCOUNTER — Other Ambulatory Visit: Payer: Self-pay | Admitting: Cardiovascular Disease

## 2021-10-23 IMAGING — MR MR HIP*L* W/O CM
6 series · 37 of 40 positions shown · non-contrast
Comparison: Left hip x-rays dated May 26, 2019. CT abdomen
pelvis dated May 19, 2019.

CLINICAL DATA: Left hip pressure wound.

EXAM:
MR OF THE LEFT HIP WITHOUT CONTRAST
TECHNIQUE: Multiplanar, multisequence MR imaging was performed. No intravenous
contrast was administered.

[Series 8: T1 · coronal · left · 4.0mm · 1.17mm/px · 7 of 25 slices shown]
[im 1/25]
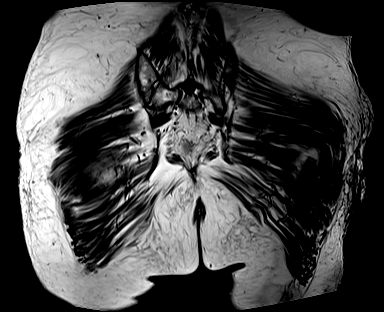
[im 5/25]
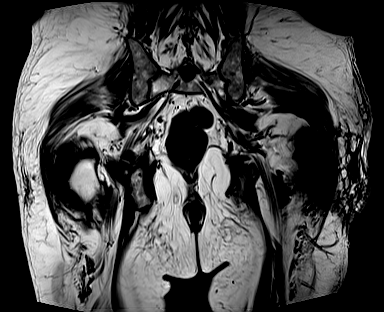
[im 9/25]
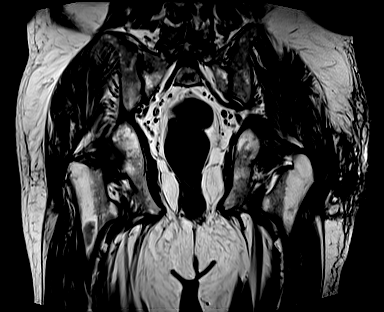
[im 13/25]
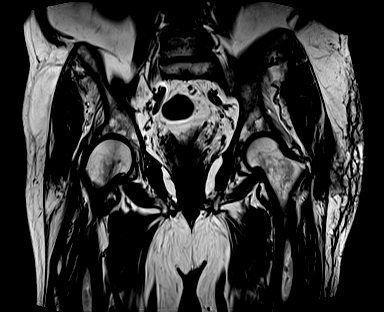
[im 17/25]
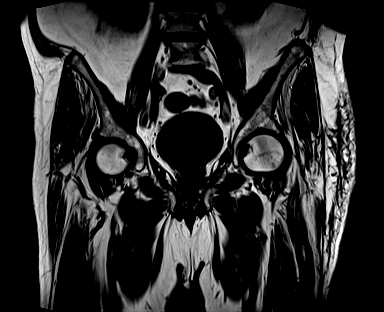
[im 21/25]
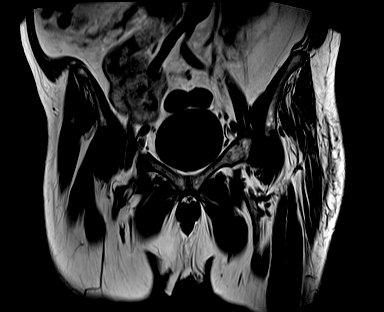
[im 25/25]
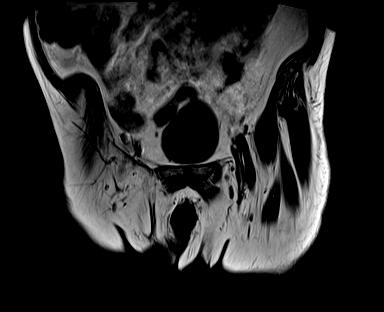

[Series 9: T2 fat-sat · coronal · left · 4.0mm · 1.00mm/px · 6 of 25 slices shown (1 of 2)]
[im 1/25]
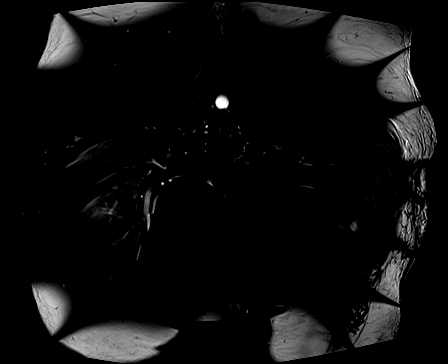
[im 5/25]
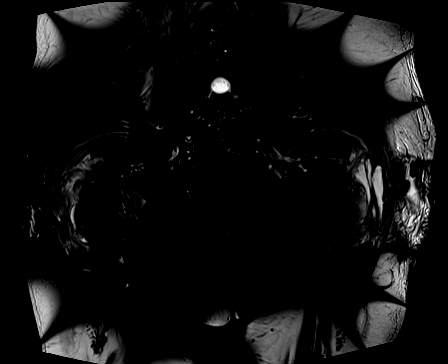
[im 10/25]
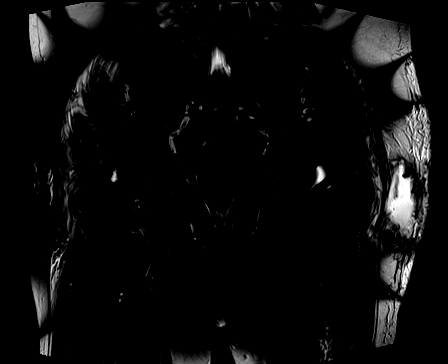
[im 15/25]
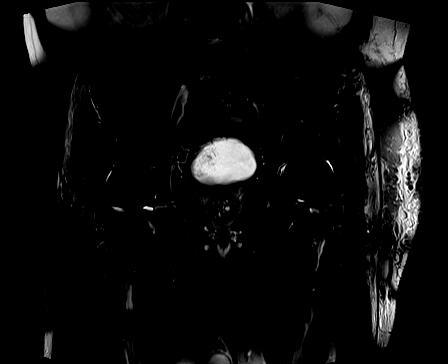
[im 20/25]
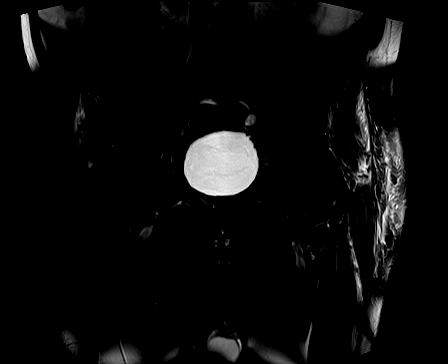
[im 25/25]
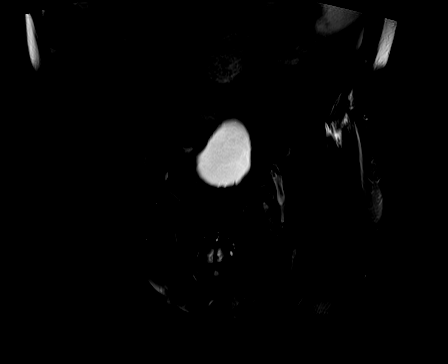

[Series 10: T2 fat-sat · axial · left · 4.0mm · 0.35mm/px · z∈[-113,+17]mm · 7 of 26 slices shown (2 of 2)]
[im 1/26]
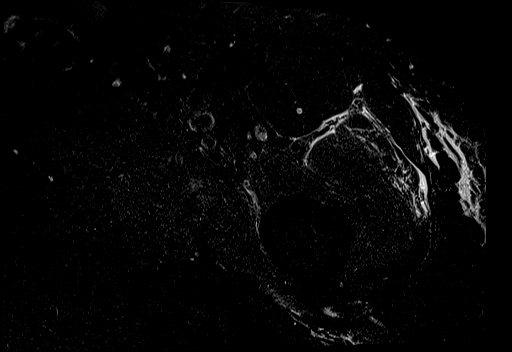
[im 5/26]
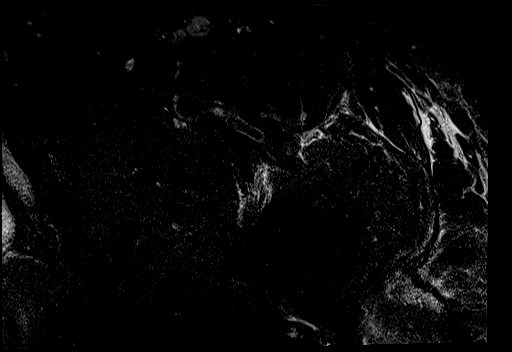
[im 9/26]
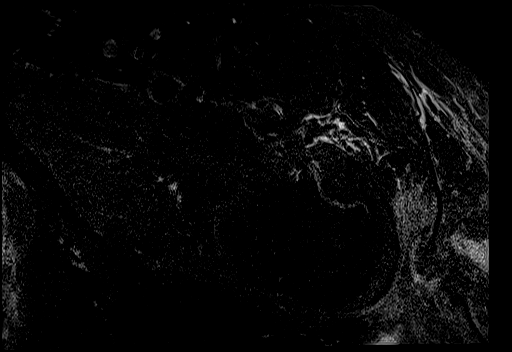
[im 13/26]
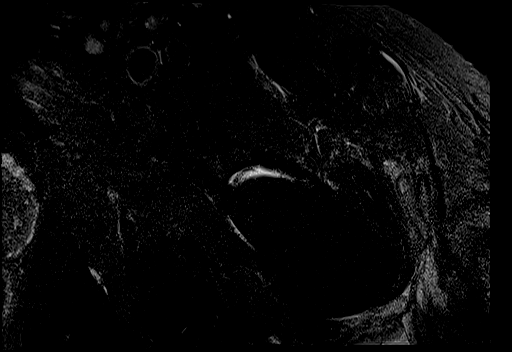
[im 17/26]
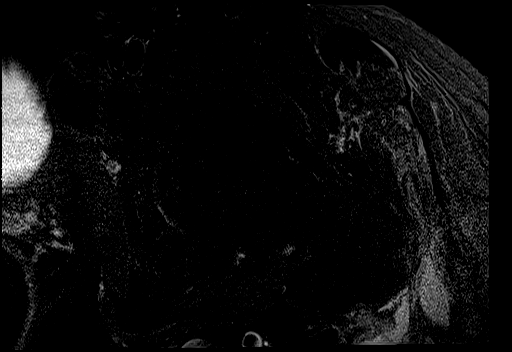
[im 21/26]
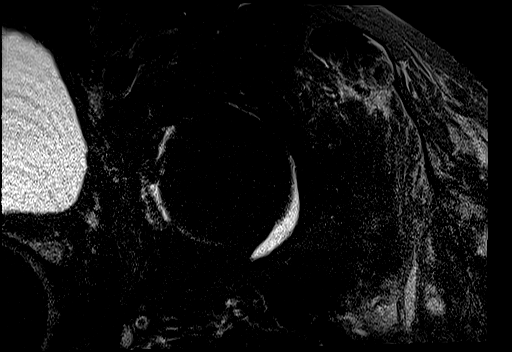
[im 26/26]
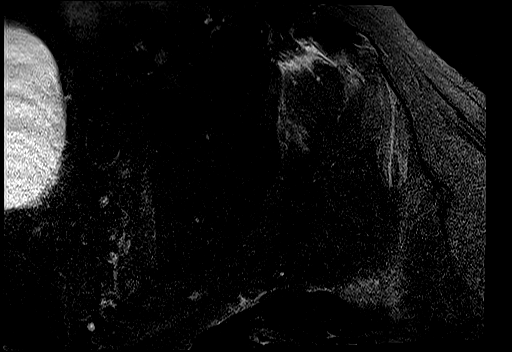

[Series 11: PD fat-sat · sagittal · left · 4.0mm · 0.56mm/px · 6 of 23 slices shown (1 of 2)]
[im 1/23]
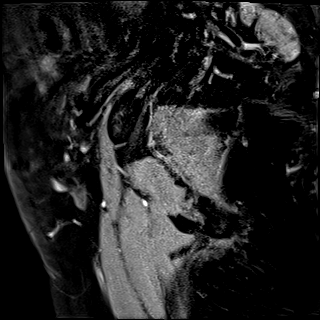
[im 5/23]
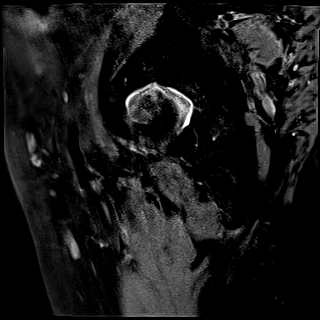
[im 9/23]
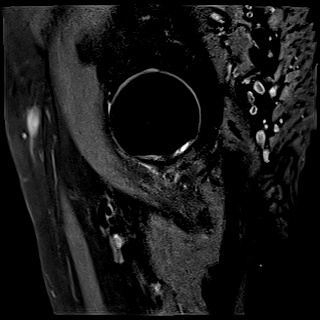
[im 14/23]
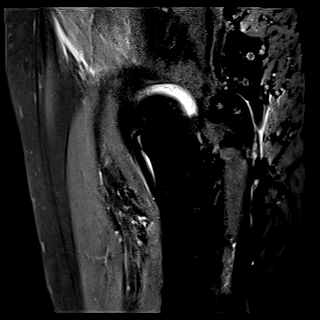
[im 18/23]
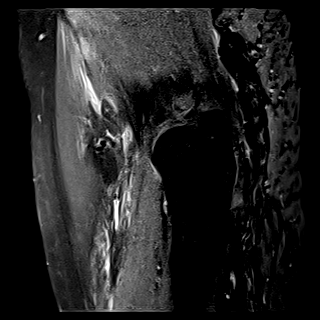
[im 23/23]
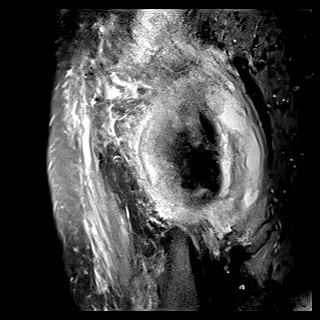

[Series 12: PD fat-sat · coronal · left · 3.0mm · 0.70mm/px · 5 of 20 slices shown (2 of 2)]
[im 1/20]
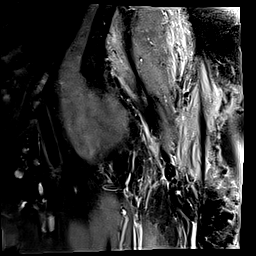
[im 5/20]
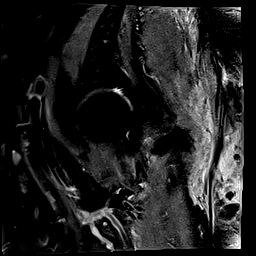
[im 10/20]
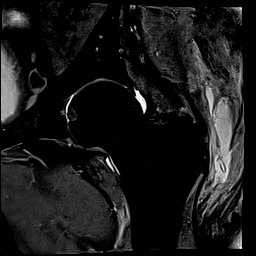
[im 15/20]
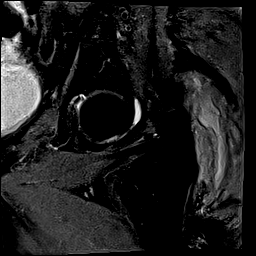
[im 20/20]
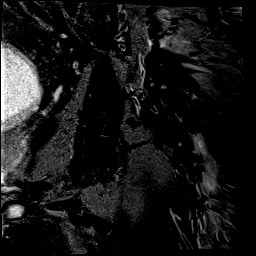

[Series 13: [id number]_bilat · axial · left · 3.0mm · 0.85mm/px · z∈[-94,-12]mm · 6 of 35 slices shown]
[im 1/35]
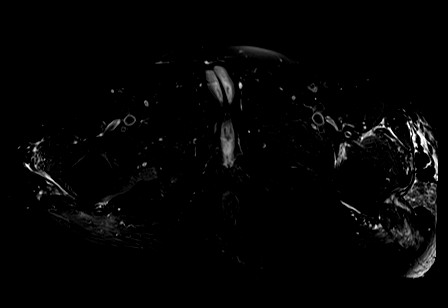
[im 5/35]
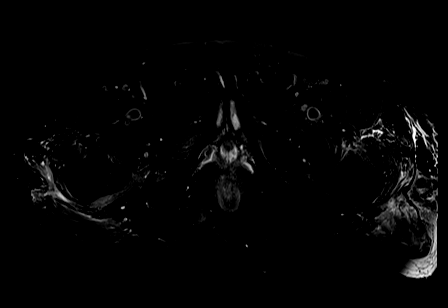
[im 9/35]
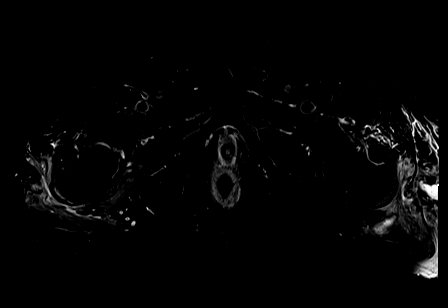
[im 13/35]
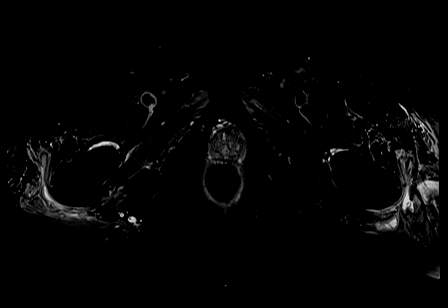
[im 22/35]
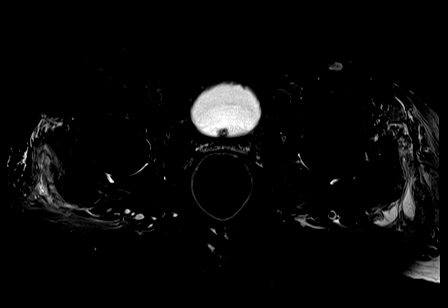
[im 26/35]
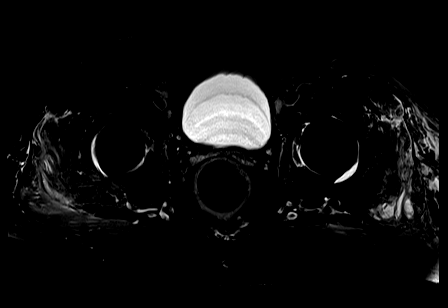

[37 of 40 positions shown; findings below may reference images not displayed]

FINDINGS: Bones: There is no evidence of acute fracture, dislocation or
avascular necrosis. No focal bone lesion. The visualized sacroiliac
joints and symphysis pubis appear normal.

Articular cartilage and labrum

Articular cartilage: No focal chondral defect or subchondral signal
abnormality identified.

Labrum: The left labrum is grossly intact. Right superior labral
tear.

Joint or bursal effusion

Joint effusion: No significant hip joint effusion.

Bursae: Prominent fluid in the left greater than right greater
trochanteric bursa.

Muscles and tendons

Muscles and tendons: The visualized gluteus, hamstring and iliopsoas
tendons appear normal. Edema within the bilateral gluteal muscle
surrounding the greater trochanteric bursa.

Other findings

Miscellaneous: The visualized internal pelvic contents appear
unremarkable.

7.8 cm gas and fluid collection the subcutaneous fat overlying the
lateral left hip with surrounding soft tissue swelling.
IMPRESSION: 1. 7.8 cm gas and fluid collection in the subcutaneous fat overlying
the lateral left hip with surrounding soft tissue swelling,
consistent with abscess.
2. Significant left and moderate right greater trochanteric
bursitis. Given the proximity of the left greater trochanteric bursa
to the overlying soft tissue abscess, septic bursitis is not
excluded.
3. No evidence of osteomyelitis.
4. Right superior labral tear.

## 2021-10-31 ENCOUNTER — Encounter: Payer: Self-pay | Admitting: Infectious Diseases

## 2021-11-01 ENCOUNTER — Other Ambulatory Visit: Payer: Self-pay | Admitting: Cardiovascular Disease

## 2021-11-04 ENCOUNTER — Encounter: Payer: Self-pay | Admitting: Cardiovascular Disease

## 2021-11-05 ENCOUNTER — Ambulatory Visit: Payer: Medicare HMO | Admitting: Cardiovascular Disease

## 2021-11-05 ENCOUNTER — Encounter: Payer: Self-pay | Admitting: Cardiovascular Disease

## 2021-11-05 ENCOUNTER — Telehealth: Payer: Self-pay | Admitting: *Deleted

## 2021-11-05 VITALS — BP 126/68 | HR 65 | Ht 70.5 in | Wt 177.4 lb

## 2021-11-05 DIAGNOSIS — I2510 Atherosclerotic heart disease of native coronary artery without angina pectoris: Secondary | ICD-10-CM

## 2021-11-05 DIAGNOSIS — E782 Mixed hyperlipidemia: Secondary | ICD-10-CM | POA: Diagnosis not present

## 2021-11-09 NOTE — Telephone Encounter (Signed)
Called pt back and left message to call back and ask to s/w Arbie Cookey. I will send the pt a message thru [facility] as this may be easier and faster communication for the pt.

## 2021-11-09 NOTE — Telephone Encounter (Signed)
Follow Up:      Patient is returning a call to [place].

## 2021-11-12 NOTE — Telephone Encounter (Signed)
S/w the pt and he is going to come in 11/15/21 @ 11 am for [facility] set up. Pt has been approved already, will give PIN # a when he comes in.

## 2021-11-12 NOTE — Telephone Encounter (Signed)
Prior Authorization for [facility] sent to [facility] via web portal. Tracking Number  NO PA REQUIRED.

## 2021-11-15 NOTE — Telephone Encounter (Signed)
Pt has been set up with Itamar study and has been given PIN # [id number] as he is approved already. Pt said he will do sleep study between tonight or over the weekend.   Called and made the patient aware that he may proceed with the [facility] Sleep Study. PIN # provided to the patient. Patient made aware that he will be contacted after the test has been read with the results and any recommendations. Patient verbalized understanding and thanked me for the call.

## 2021-11-15 NOTE — Telephone Encounter (Signed)
Patient Name:         DOB:       Height:     Weight:  Office Name:         Referring Provider:  Today's Date:  Date:   STOP BANG RISK ASSESSMENT S (snore) Have you been told that you snore?     YES   T (tired) Are you often tired, fatigued, or sleepy during the day?   YES  O (obstruction) Do you stop breathing, choke, or gasp during sleep? NO   P (pressure) Do you have or are you being treated for high blood pressure? YES   B (BMI) Is your body index greater than 35 kg/m? NO   A (age) Are you 74 years old or older? YES   N (neck) Do you have a neck circumference greater than 16 inches?   YES   G (gender) Are you a male? YES   TOTAL STOP/BANG "YES" ANSWERS 6                                                                       For Office Use Only              Procedure Order Form    YES to 3+ Stop Bang questions OR two clinical symptoms - patient qualifies for WatchPAT (CPT 95800)             Clinical Notes: Will consult Sleep Specialist and refer for management of therapy due to patient increased risk of Sleep Apnea. Ordering a sleep study due to the following two clinical symptoms: Excessive daytime sleepiness G47.10 / Gastroesophageal reflux K21.9 / Nocturia R35.1 / Morning Headaches G44.221 / Difficulty concentrating R41.840 / Memory problems or poor judgment G31.84 / Personality changes or irritability R45.4 / Loud snoring R06.83 / Depression F32.9 / Unrefreshed by sleep G47.8 / Impotence N52.9 / History of high blood pressure R03.0 / Insomnia G47.00    I understand that I am proceeding with a home sleep apnea test as ordered by my treating physician. I understand that untreated sleep apnea is a serious cardiovascular risk factor and it is my responsibility to perform the test and seek management for sleep apnea. I will be contacted with the results and be managed for sleep apnea by a local sleep physician. I will be receiving equipment and further instructions from  [facility]. I shall promptly ship back the equipment via the included mailing label. I understand my insurance will be billed for the test and as the patient I am responsible for any insurance related out-of-pocket costs incurred. I have been provided with written instructions and can call for additional video or telephonic instruction, with 24-hour availability of qualified personnel to answer any questions: Patient Help Desk [phone].  Patient Signature ______________________________________________________   Date______________________ Patient Telemedicine Verbal Consent

## 2021-11-22 ENCOUNTER — Encounter (INDEPENDENT_AMBULATORY_CARE_PROVIDER_SITE_OTHER): Payer: Medicare HMO | Admitting: Cardiology

## 2021-11-22 DIAGNOSIS — G4733 Obstructive sleep apnea (adult) (pediatric): Secondary | ICD-10-CM | POA: Diagnosis not present

## 2021-11-22 DIAGNOSIS — G4734 Idiopathic sleep related nonobstructive alveolar hypoventilation: Secondary | ICD-10-CM

## 2021-11-23 ENCOUNTER — Ambulatory Visit: Payer: Medicare HMO

## 2021-11-23 DIAGNOSIS — I2510 Atherosclerotic heart disease of native coronary artery without angina pectoris: Secondary | ICD-10-CM

## 2021-11-23 NOTE — Procedures (Signed)
   SLEEP STUDY REPORT Patient Information Study Date: 11/23/21 Patient Name: Ivan [name] Patient ID: [id number] Birth Date: 09-28-2045 Age: 76 Gender: Male BMI: 25.2 (W=176 lb, H=5' 10'') Neck Circ.: 25 '' Referring Physician: Mertie Moores, MD  TEST DESCRIPTION: Home sleep apnea testing was completed using the WatchPat, a Type 1 device, utilizing  peripheral arterial tonometry (PAT), chest movement, actigraphy, pulse oximetry, pulse rate, body position and snore.  AHI was calculated with apnea and hypopnea using valid sleep time as the denominator. RDI includes apneas,  hypopneas, and RERAs. The data acquired and the scoring of sleep and all associated events were performed in  accordance with the recommended standards and specifications as outlined in the AASM Manual for the Scoring of  Sleep and Associated Events 2.2.0 (2015).   FINDINGS:  1. Mild Obstructive Sleep Apnea with AHI 7.8/hr and 22/hr during REM sleep.  2. No Central Sleep Apnea with pAHIc 0/hr.  3. Oxygen desaturations as low as 78%.  4. Mild snoring was present. O2 sats were < 88% for 28.4 min.  5. Total sleep time was 5 hrs and 17 min.  6. 24.9% of total sleep time was spent in REM sleep.  7. Normal sleep onset latency at 19 min.  8. Shortened REM sleep onset latency at 53 min.  9. Total awakenings were 2.  DIAGNOSIS: Mild Obstructive Sleep Apnea (G47.33) Nocturnal Hypoxemia  RECOMMENDATIONS: 1. Clinical correlation of these findings is necessary. The decision to treat obstructive sleep apnea (OSA) is usually  based on the presence of apnea symptoms or the presence of associated medical conditions such as Hypertension,  Congestive Heart Failure, Atrial Fibrillation or Obesity. The most common symptoms of OSA are snoring, gasping for  breath while sleeping, daytime sleepiness and fatigue.   2. Initiating apnea therapy is recommended given the presence of symptoms and/or associated conditions.  Recommend  proceeding with one of the following:   a. Auto-CPAP therapy with a pressure range of 5-20cm H2O.   b. An oral appliance (OA) that can be obtained from certain dentists with expertise in sleep medicine. These are  primarily of use in non-obese patients with mild and moderate disease.   c. An ENT consultation which may be useful to look for specific causes of obstruction and possible treatment  options.   d. If patient is intolerant to PAP therapy, consider referral to ENT for evaluation for hypoglossal nerve stimulator.   3. Close follow-up is necessary to ensure success with CPAP or oral appliance therapy for maximum benefit .  4. A follow-up oximetry study on CPAP is recommended to assess the adequacy of therapy and determine the need  for supplemental oxygen or the potential need for Bi-level therapy. An arterial blood gas to determine the adequacy of  baseline ventilation and oxygenation should also be considered.  5. Healthy sleep recommendations include: adequate nightly sleep (normal 7-9 hrs/night), avoidance of caffeine after  noon and alcohol near bedtime, and maintaining a sleep environment that is cool, dark and quiet.  6. Weight loss for overweight patients is recommended. Even modest amounts of weight loss can significantly  improve the severity of sleep apnea.  7. Snoring recommendations include: weight loss where appropriate, side sleeping, and avoidance of alcohol before  bed.  8. Operation of motor vehicle should be avoided when sleepy.  Signature: Electronically Signed: 11/23/21 Fransico Him, MD; [facility]; [place], American Board of  Sleep Medicine

## 2021-11-29 ENCOUNTER — Other Ambulatory Visit: Payer: Self-pay | Admitting: Cardiovascular Disease

## 2021-12-19 ENCOUNTER — Encounter: Payer: Self-pay | Admitting: Cardiovascular Disease

## 2021-12-24 ENCOUNTER — Telehealth: Payer: Self-pay | Admitting: *Deleted

## 2021-12-24 DIAGNOSIS — G4733 Obstructive sleep apnea (adult) (pediatric): Secondary | ICD-10-CM

## 2021-12-24 DIAGNOSIS — I10 Essential (primary) hypertension: Secondary | ICD-10-CM

## 2021-12-24 DIAGNOSIS — I2510 Atherosclerotic heart disease of native coronary artery without angina pectoris: Secondary | ICD-10-CM

## 2021-12-24 DIAGNOSIS — G4734 Idiopathic sleep related nonobstructive alveolar hypoventilation: Secondary | ICD-10-CM

## 2021-12-24 NOTE — Telephone Encounter (Signed)
-----   Message from Lauralee Evener, [place] sent at 12/11/2021  8:56 AM EDT -----  ----- Message ----- From: Sueanne Margarita, MD Sent: 11/23/2021   1:54 PM EDT To: [facility] Sleep Studies  Please let patient know that they have sleep apnea.  Recommend therapeutic CPAP titration for treatment of patient's sleep disordered breathing.  If unable to perform an in lab titration then initiate ResMed auto CPAP from 4 to 15cm H2O with heated humidity and mask of choice and overnight pulse ox on CPAP.

## 2021-12-24 NOTE — Telephone Encounter (Signed)
The patient has been notified of the result and verbalized understanding.  All questions (if any) were answered. Marolyn Hammock, [place] 12/24/2021 12:52 PM    Pt is agreeable to result Will precert tit

## 2021-12-27 ENCOUNTER — Telehealth: Payer: Self-pay | Admitting: Cardiovascular Disease

## 2021-12-27 NOTE — Telephone Encounter (Signed)
Patient called stating he was called to be scheduled for a sleep study.  Patient stated he already had a home study and would like clarification on this testing.  Patient requested a call back to his work number -  [phone].

## 2022-01-02 NOTE — Addendum Note (Signed)
Addended by: Freada Bergeron on: 01/02/2022 06:35 PM   Modules accepted: Orders

## 2022-01-02 NOTE — Telephone Encounter (Addendum)
The patient has been notified of the result and verbalized understanding.  All questions (if any) were answered. Ivan [name], [place] 01/02/2022 6:30 PM    Patient states it is a hardship for him to be away from home even on the weekends because of his work so we will order the APAP.  If unable to perform an in lab titration then initiate ResMed auto CPAP from 4 to 15cm H2O with heated humidity and mask of choice and overnight pulse ox on CPAP.     Upon patient request DME selection is [facility]. Patient understands he will be contacted by [facility] to set up his cpap. Patient understands to call if [facility] does not contact him with new setup in a timely manner. Patient understands they will be called once confirmation has been received from [facility]/ that they have received their new machine to schedule 10 week follow up appointment.   Filer notified of new cpap order  Please add to airview Patient was grateful for the call and thanked me.

## 2022-01-03 NOTE — Telephone Encounter (Signed)
Called patient Pt is agreeable to treatment.

## 2022-01-07 ENCOUNTER — Other Ambulatory Visit: Payer: Self-pay | Admitting: Cardiovascular Disease

## 2022-01-19 ENCOUNTER — Other Ambulatory Visit: Payer: Self-pay | Admitting: Cardiovascular Disease

## 2022-05-15 ENCOUNTER — Telehealth: Payer: Self-pay

## 2022-05-15 NOTE — Telephone Encounter (Signed)
Patient called in to check on a referral that was supposed to be sent to neurology from Dr Royce Macadamia at [facility]. I advised that the only referral we have recently received from Dr Royce Macadamia was to our sleep lab for sleep apnea. He got upset and advised that he did not need a referral to our sleep lab, but that he needed an appointment for neurological concerns. I let him know that he would need to see his PCP to discuss these concerns before a referral could be sent over to [place]. He stated he has never had this much trouble trying to schedule an appointment and that he would call his PCP. Call was disconnected.   I reached out to Dr Rubye [place] office and spoke with [name]. He has not been seen by Dr Royce Macadamia for any neurological concerns. He has an appointment scheduled for 06/04/2022.

## 2022-06-08 LAB — LAB REPORT - SCANNED
A1c: 5.1
EGFR (Non-African Amer.): 124

## 2022-07-19 ENCOUNTER — Other Ambulatory Visit: Payer: Self-pay | Admitting: Cardiovascular Disease

## 2022-09-02 ENCOUNTER — Other Ambulatory Visit: Payer: Self-pay | Admitting: Cardiovascular Disease

## 2022-09-12 ENCOUNTER — Other Ambulatory Visit: Payer: Self-pay | Admitting: Cardiovascular Disease

## 2022-09-30 ENCOUNTER — Other Ambulatory Visit: Payer: Self-pay | Admitting: Cardiovascular Disease

## 2022-10-09 ENCOUNTER — Other Ambulatory Visit: Payer: Self-pay | Admitting: Cardiovascular Disease

## 2022-10-24 ENCOUNTER — Other Ambulatory Visit: Payer: Self-pay | Admitting: Cardiovascular Disease
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
## 2022-10-28 ENCOUNTER — Encounter: Payer: Self-pay | Admitting: Nurse Practitioner

## 2022-10-28 ENCOUNTER — Ambulatory Visit: Payer: Medicare HMO | Attending: Nurse Practitioner | Admitting: Nurse Practitioner

## 2022-10-28 VITALS — BP 136/72 | HR 68 | Ht 71.0 in | Wt 178.0 lb

## 2022-10-28 DIAGNOSIS — I10 Essential (primary) hypertension: Secondary | ICD-10-CM

## 2022-10-28 DIAGNOSIS — I2510 Atherosclerotic heart disease of native coronary artery without angina pectoris: Secondary | ICD-10-CM

## 2022-10-28 DIAGNOSIS — R600 Localized edema: Secondary | ICD-10-CM | POA: Diagnosis not present

## 2022-10-28 DIAGNOSIS — E785 Hyperlipidemia, unspecified: Secondary | ICD-10-CM | POA: Diagnosis not present

## 2022-10-28 MED ORDER — FUROSEMIDE 20 MG PO TABS: 20.0000 mg | ORAL_TABLET | Freq: Every day | ORAL | 0 refills | Status: DC | PRN

## 2022-10-28 NOTE — Patient Instructions (Signed)
Medication Instructions:  START Lasix 20mg  Take once a day as needed for swelling  *If you need a refill on your cardiac medications before your next appointment, please call your pharmacy*   Lab Work: TODAY-BMET If you have labs (blood work) drawn today and your tests are completely normal, you will receive your results only by: MyChart Message (if you have MyChart) OR A paper copy in the mail If you have any lab test that is abnormal or we need to change your treatment, we will call you to review the results.   Testing/Procedures: Your physician has requested that you have an echocardiogram. Echocardiography is a painless test that uses sound waves to create images of your heart. It provides your doctor with information about the size and shape of your heart and how well your heart's chambers and valves are working. This procedure takes approximately one hour. There are no restrictions for this procedure. Please do NOT wear cologne, perfume, aftershave, or lotions (deodorant is allowed). Please arrive 15 minutes prior to your appointment time.   Follow-Up: At [facility], you and your health needs are our priority.  As part of our continuing mission to provide you with exceptional heart care, we have created designated Provider Care Teams.  These Care Teams include your primary Cardiologist (physician) and Advanced Practice Providers (APPs -  Physician Assistants and Nurse Practitioners) who all work together to provide you with the care you need, when you need it.  We recommend signing up for the patient portal called "MyChart".  Sign up information is provided on this After Visit Summary.  MyChart is used to connect with patients for Virtual Visits (Telemedicine).  Patients are able to view lab/test results, encounter notes, upcoming appointments, etc.  Non-urgent messages can be sent to your provider as well.   To learn more about what you can do with MyChart, go to  [email or web address].    Your next appointment:   12 month(s)  Provider:   Kristeen Miss, MD  or Robin Searing,. NP   Other Instructions

## 2022-10-29 ENCOUNTER — Other Ambulatory Visit: Payer: Self-pay

## 2022-10-29 DIAGNOSIS — I2510 Atherosclerotic heart disease of native coronary artery without angina pectoris: Secondary | ICD-10-CM

## 2022-10-29 DIAGNOSIS — R600 Localized edema: Secondary | ICD-10-CM

## 2022-10-29 DIAGNOSIS — I10 Essential (primary) hypertension: Secondary | ICD-10-CM

## 2022-10-29 LAB — BASIC METABOLIC PANEL
BUN/Creatinine Ratio: 30 — ABNORMAL HIGH (ref 10–24)
BUN: 23 mg/dL (ref 8–27)
CO2: 25 mmol/L (ref 20–29)
Calcium: 9.2 mg/dL (ref 8.6–10.2)
Chloride: 105 mmol/L (ref 96–106)
Creatinine, Ser: 0.77 mg/dL (ref 0.76–1.27)
Glucose: 103 mg/dL — ABNORMAL HIGH (ref 70–99)
Potassium: 4.6 mmol/L (ref 3.5–5.2)
Sodium: 143 mmol/L (ref 134–144)
eGFR: 94 mL/min/{1.73_m2} (ref >59)

## 2022-11-16 ENCOUNTER — Other Ambulatory Visit: Payer: Self-pay | Admitting: Cardiovascular Disease

## 2022-11-19 ENCOUNTER — Other Ambulatory Visit: Payer: Self-pay | Admitting: Nurse Practitioner

## 2022-11-21 ENCOUNTER — Ambulatory Visit (HOSPITAL_COMMUNITY): Payer: Medicare HMO

## 2022-11-25 ENCOUNTER — Ambulatory Visit (HOSPITAL_COMMUNITY): Payer: Medicare HMO | Attending: Nurse Practitioner

## 2022-11-25 DIAGNOSIS — I10 Essential (primary) hypertension: Secondary | ICD-10-CM | POA: Diagnosis not present

## 2022-11-25 DIAGNOSIS — E785 Hyperlipidemia, unspecified: Secondary | ICD-10-CM

## 2022-11-25 DIAGNOSIS — I2510 Atherosclerotic heart disease of native coronary artery without angina pectoris: Secondary | ICD-10-CM

## 2022-11-25 LAB — ECHOCARDIOGRAM COMPLETE
Area-P 1/2: 3.61 cm2
S' Lateral: 2.4 cm

## 2023-01-07 ENCOUNTER — Other Ambulatory Visit: Payer: Self-pay | Admitting: Cardiovascular Disease

## 2023-02-13 ENCOUNTER — Other Ambulatory Visit (HOSPITAL_COMMUNITY): Payer: Self-pay | Admitting: Urology

## 2023-02-13 DIAGNOSIS — N1330 Unspecified hydronephrosis: Secondary | ICD-10-CM

## 2023-02-20 ENCOUNTER — Encounter (HOSPITAL_COMMUNITY)
Admission: RE | Admit: 2023-02-20 | Discharge: 2023-02-20 | Disposition: A | Discharge status: Home / Self Care | Payer: Medicare HMO | Source: Ambulatory Visit | Attending: Urology | Admitting: Urology

## 2023-02-20 DIAGNOSIS — N1330 Unspecified hydronephrosis: Secondary | ICD-10-CM | POA: Insufficient documentation

## 2023-02-20 MED ORDER — FUROSEMIDE 10 MG/ML IJ SOLN
40.0000 mg | Freq: Once | INTRAMUSCULAR | Status: AC
  Administered 2023-02-20: 40 mg via INTRAVENOUS

## 2023-02-20 MED ORDER — FUROSEMIDE 10 MG/ML IJ SOLN
INTRAMUSCULAR | Status: AC
  Filled 2023-02-20: qty 4

## 2023-08-06 NOTE — Telephone Encounter (Signed)
error 

## 2023-08-26 ENCOUNTER — Other Ambulatory Visit: Payer: Self-pay | Admitting: Cardiovascular Disease

## 2023-08-28 ENCOUNTER — Other Ambulatory Visit: Payer: Self-pay | Admitting: Cardiovascular Disease

## 2023-10-01 ENCOUNTER — Ambulatory Visit: Admitting: Urology

## 2023-10-03 ENCOUNTER — Emergency Department (HOSPITAL_COMMUNITY): Admission: EM | Admit: 2023-10-03 | Discharge: 2023-10-03 | Disposition: A | Discharge status: Home / Self Care

## 2023-10-03 ENCOUNTER — Encounter (HOSPITAL_COMMUNITY): Payer: Self-pay | Admitting: *Deleted

## 2023-10-03 ENCOUNTER — Emergency Department (HOSPITAL_COMMUNITY)

## 2023-10-03 ENCOUNTER — Other Ambulatory Visit: Payer: Self-pay

## 2023-10-03 DIAGNOSIS — E785 Hyperlipidemia, unspecified: Secondary | ICD-10-CM | POA: Insufficient documentation

## 2023-10-03 DIAGNOSIS — N390 Urinary tract infection, site not specified: Secondary | ICD-10-CM | POA: Insufficient documentation

## 2023-10-03 DIAGNOSIS — Y92009 Unspecified place in unspecified non-institutional (private) residence as the place of occurrence of the external cause: Secondary | ICD-10-CM | POA: Insufficient documentation

## 2023-10-03 DIAGNOSIS — M542 Cervicalgia: Secondary | ICD-10-CM | POA: Diagnosis not present

## 2023-10-03 DIAGNOSIS — W19XXXA Unspecified fall, initial encounter: Secondary | ICD-10-CM | POA: Diagnosis not present

## 2023-10-03 DIAGNOSIS — B9689 Other specified bacterial agents as the cause of diseases classified elsewhere: Secondary | ICD-10-CM | POA: Insufficient documentation

## 2023-10-03 DIAGNOSIS — I2510 Atherosclerotic heart disease of native coronary artery without angina pectoris: Secondary | ICD-10-CM | POA: Insufficient documentation

## 2023-10-03 DIAGNOSIS — M545 Low back pain, unspecified: Secondary | ICD-10-CM | POA: Diagnosis present

## 2023-10-03 DIAGNOSIS — R4182 Altered mental status, unspecified: Secondary | ICD-10-CM | POA: Insufficient documentation

## 2023-10-03 DIAGNOSIS — Z7982 Long term (current) use of aspirin: Secondary | ICD-10-CM | POA: Insufficient documentation

## 2023-10-03 LAB — URINALYSIS, W/ REFLEX TO CULTURE (INFECTION SUSPECTED)
Bilirubin Urine: NEGATIVE
Glucose, UA: NEGATIVE mg/dL
Ketones, ur: 5 mg/dL — AB
Nitrite: NEGATIVE
Protein, ur: NEGATIVE mg/dL
Specific Gravity, Urine: 1.016 (ref 1.005–1.030)
pH: 5 (ref 5.0–8.0)

## 2023-10-03 LAB — COMPREHENSIVE METABOLIC PANEL WITH GFR
ALT: 18 U/L (ref 0–44)
AST: 29 U/L (ref 15–41)
Albumin: 3.2 g/dL — ABNORMAL LOW (ref 3.5–5.0)
Alkaline Phosphatase: 40 U/L (ref 38–126)
Anion gap: 9 (ref 5–15)
BUN: 22 mg/dL (ref 8–23)
CO2: 23 mmol/L (ref 22–32)
Calcium: 8.3 mg/dL — ABNORMAL LOW (ref 8.9–10.3)
Chloride: 106 mmol/L (ref 98–111)
Creatinine, Ser: 0.94 mg/dL (ref 0.61–1.24)
GFR, Estimated: >60 mL/min (ref >60)
Glucose, Bld: 117 mg/dL — ABNORMAL HIGH (ref 70–99)
Potassium: 3.7 mmol/L (ref 3.5–5.1)
Sodium: 138 mmol/L (ref 135–145)
Total Bilirubin: 1 mg/dL (ref 0.0–1.2)
Total Protein: 5.4 g/dL — ABNORMAL LOW (ref 6.5–8.1)

## 2023-10-03 LAB — CBC WITH DIFFERENTIAL/PLATELET
Abs Immature Granulocytes: 0.02 10*3/uL (ref 0.00–0.07)
Basophils Absolute: 0 10*3/uL (ref 0.0–0.1)
Basophils Relative: 0 %
Eosinophils Absolute: 0 10*3/uL (ref 0.0–0.5)
Eosinophils Relative: 0 %
HCT: 41.2 % (ref 39.0–52.0)
Hemoglobin: 13.8 g/dL (ref 13.0–17.0)
Immature Granulocytes: 0 %
Lymphocytes Relative: 1 %
Lymphs Abs: 0.1 10*3/uL — ABNORMAL LOW (ref 0.7–4.0)
MCH: 32 pg (ref 26.0–34.0)
MCHC: 33.5 g/dL (ref 30.0–36.0)
MCV: 95.6 fL (ref 80.0–100.0)
Monocytes Absolute: 0 10*3/uL — ABNORMAL LOW (ref 0.1–1.0)
Monocytes Relative: 0 %
Neutro Abs: 6.8 10*3/uL (ref 1.7–7.7)
Neutrophils Relative %: 99 %
Platelets: 136 10*3/uL — ABNORMAL LOW (ref 150–400)
RBC: 4.31 MIL/uL (ref 4.22–5.81)
RDW: 13.4 % (ref 11.5–15.5)
WBC: 6.9 10*3/uL (ref 4.0–10.5)
nRBC: 0 % (ref 0.0–0.2)

## 2023-10-03 LAB — PROTIME-INR
INR: 1.1 (ref 0.8–1.2)
Prothrombin Time: 14.8 s (ref 11.4–15.2)

## 2023-10-03 LAB — CBG MONITORING, ED: Glucose-Capillary: 116 mg/dL — ABNORMAL HIGH (ref 70–99)

## 2023-10-03 LAB — ETHANOL: Alcohol, Ethyl (B): <15 mg/dL (ref <15)

## 2023-10-03 LAB — I-STAT CG4 LACTIC ACID, ED: Lactic Acid, Venous: 2.3 mmol/L (ref 0.5–1.9)

## 2023-10-03 MED ORDER — KETOROLAC TROMETHAMINE 15 MG/ML IJ SOLN
15.0000 mg | Freq: Once | INTRAMUSCULAR | Status: AC
  Administered 2023-10-03: 15 mg via INTRAVENOUS
  Filled 2023-10-03: qty 1

## 2023-10-03 MED ORDER — CIPROFLOXACIN HCL 500 MG PO TABS
500.0000 mg | ORAL_TABLET | Freq: Once | ORAL | Status: AC
  Administered 2023-10-03: 500 mg via ORAL
  Filled 2023-10-03: qty 1

## 2023-10-03 MED ORDER — CIPROFLOXACIN HCL 500 MG PO TABS: 500.0000 mg | ORAL_TABLET | Freq: Two times a day (BID) | ORAL | 0 refills | Status: AC

## 2023-10-03 NOTE — ED Notes (Signed)
 C-collar removed per order Dr. Charlee Conine.

## 2023-10-03 NOTE — ED Triage Notes (Signed)
 Patient presents to ed via [facility] from home states he fell this am has a history of back pain with discitis per ems son states patient had AMS upon arrival patient arrival alert oriented however slow to resp at times.  States he took his pain medication this am because of the pain. . States he fell from a standing position , however doesn't know if the fall was from pain or his legs being weak.

## 2023-10-03 NOTE — ED Provider Notes (Signed)
 [facility] Provider Note   CSN: [id number] Arrival date & time: 10/03/23  7829     History  Chief Complaint  Patient presents with   Altered Mental Status   Back Pain    Ivan [name] is a 78 y.o. male.  78 year old male with past medical history of alcohol use, coronary artery disease, hyperlipidemia, and history of discitis and osteomyelitis of his lower back presenting to the emergency department today with low back pain.  Patient states he woke up this this morning.  His mother apparently told medics that the patient has been more altered than normal over the morning.  He states that he was having back pain and fell due to this.  Denies any focal weakness but states that the pain caused this.  He is unsure if he hit his head or lost consciousness.  He denies any fevers but is not the most reliable historian here.  He is reporting that he has chronic neck pain but states that it is worse since falling.   Altered Mental Status Back Pain      Home Medications Prior to Admission medications   Medication Sig Start Date End Date Taking? Authorizing Provider  ciprofloxacin  (CIPRO ) 500 MG tablet Take 1 tablet (500 mg total) by mouth every 12 (twelve) hours. 10/03/23  Yes Carin Charleston, MD  acetaminophen  (TYLENOL ) 500 MG tablet Take 2 tablets (1,000 mg total) by mouth every 6 (six) hours. 07/04/19   Lacinda Pica, MD  ALPRAZolam  (XANAX ) 1 MG tablet Take 1 mg by mouth 2 (two) times daily as needed. 08/14/21   [provider]  amLODipine  (NORVASC ) 5 MG tablet TAKE 1 TABLET (5 MG TOTAL) BY MOUTH DAILY. 08/28/23   Nahser, Lela Purple, MD  amphetamine -dextroamphetamine  (ADDERALL) 20 MG tablet Take 20 mg by mouth 2 (two) times daily as needed.    [provider]  aspirin  EC 81 MG EC tablet Take 1 tablet (81 mg total) by mouth daily. 10/14/14   Meng, Hao, PA  buPROPion  (WELLBUTRIN  XL) 150 MG 24 hr tablet Take 150 mg by mouth every  morning. 10/23/21   [provider]  carvedilol  (COREG ) 12.5 MG tablet TAKE 1 TABLET(12.5 MG) BY MOUTH TWICE DAILY 11/19/22   Nahser, Lela Purple, MD  furosemide  (LASIX ) 20 MG tablet TAKE 1 TABLET (20 MG TOTAL) BY MOUTH DAILY AS NEEDED FOR EDEMA (LOWER EXTREMITY EDEMA). 11/19/22 02/17/23  Nahser, Lela Purple, MD  gabapentin  (NEURONTIN ) 300 MG capsule Take 300 mg by mouth as needed (for lumbar pain in spine).    [provider]  lidocaine -prilocaine (EMLA) cream as needed (pain). 06/20/22   [provider]  Multiple Vitamin (MULTIVITAMIN WITH MINERALS) TABS tablet Take 1 tablet by mouth daily. 07/04/19   Lacinda Pica, MD  naproxen  (NAPROSYN ) 250 MG tablet Take 1 tablet (250 mg total) by mouth every 8 (eight) hours as needed for moderate pain. 06/09/19   Catheryn Cluck, MD  polyethylene glycol (MIRALAX  / GLYCOLAX ) 17 g packet Take 17 g by mouth daily as needed for mild constipation. 07/04/19   Lacinda Pica, MD  rosuvastatin  (CRESTOR ) 20 MG tablet TAKE 1 TABLET BY MOUTH EVERY DAY 08/26/23   Nahser, Lela Purple, MD  tadalafil  (CIALIS ) 10 MG tablet Take 10 mg by mouth daily as needed.    [provider]  tamsulosin  (FLOMAX ) 0.4 MG CAPS capsule Take 0.4 mg by mouth daily. 10/30/21   [provider]  testosterone  cypionate (DEPOTESTOSTERONE  CYPIONATE) 200 MG/ML injection every 21 ( twenty-one) days. 12/20/20   [provider]  traMADol  (ULTRAM ) 50 MG tablet  11/10/20   [provider]  VRAYLAR 1.5 MG capsule Take 1.5 mg by mouth daily. 10/24/22   [provider]  DULoxetine  (CYMBALTA ) 30 MG capsule Take 2 capsules (60 mg total) by mouth daily. Patient not taking: No sig reported 11/24/19 02/13/20  Geoffry Kill, MD      Allergies    Cefepime , Merrem  [meropenem ], Ace inhibitors, Contrast media [iodinated contrast media], and Vancomycin     Review of Systems   Review of Systems  Musculoskeletal:  Positive for back pain and neck pain.  All  other systems reviewed and are negative.   Physical Exam Updated Vital Signs BP 110/65   Pulse 82   Temp 99 F (37.2 C) (Oral)   Resp 15   Ht 5\' 11"  (1.803 m)   Wt 79.4 kg   SpO2 97%   BMI 24.41 kg/m  Physical Exam Vitals and nursing note reviewed.   Gen: NAD Eyes: PERRL, EOMI HEENT: no oropharyngeal swelling Neck: trachea midline, c-collar in place, midline tenderness noted, no step-offs or deformities Resp: clear to auscultation bilaterally Card: RRR, no murmurs, rubs, or gallops Abd: nontender, nondistended Extremities: no calf tenderness, no edema Vascular: 2+ radial pulses bilaterally, 2+ DP pulses bilaterally Neuro: mild dysarthria noted, Cns intact, equal strength sensation throughout bilateral upper and lower extremities Skin: no rashes Psyc: acting appropriately   ED Results / Procedures / Treatments   Labs (all labs ordered are listed, but only abnormal results are displayed) Labs Reviewed  COMPREHENSIVE METABOLIC PANEL WITH GFR - Abnormal; Notable for the following components:      Result Value   Glucose, Bld 117 (*)    Calcium  8.3 (*)    Total Protein 5.4 (*)    Albumin 3.2 (*)    All other components within normal limits  URINALYSIS, W/ REFLEX TO CULTURE (INFECTION SUSPECTED) - Abnormal; Notable for the following components:   APPearance HAZY (*)    Hgb urine dipstick SMALL (*)    Ketones, ur 5 (*)    Leukocytes,Ua TRACE (*)    Bacteria, UA RARE (*)    All other components within normal limits  CBC WITH DIFFERENTIAL/PLATELET - Abnormal; Notable for the following components:   Platelets 136 (*)    Lymphs Abs 0.1 (*)    Monocytes Absolute 0.0 (*)    All other components within normal limits  CBG MONITORING, ED - Abnormal; Notable for the following components:   Glucose-Capillary 116 (*)    All other components within normal limits  I-STAT CG4 LACTIC ACID, ED - Abnormal; Notable for the following components:   Lactic Acid, Venous 2.3 (*)    All  other components within normal limits  CULTURE, BLOOD (ROUTINE X 2)  CULTURE, BLOOD (ROUTINE X 2)  PROTIME-INR  ETHANOL  CBC WITH DIFFERENTIAL/PLATELET  I-STAT CG4 LACTIC ACID, ED    EKG EKG Interpretation Date/Time:  Friday Oct 03 2023 09:30:48 EDT Ventricular Rate:  86 PR Interval:  171 QRS Duration:  120 QT Interval:  366 QTC Calculation: 438 R Axis:   -62  Text Interpretation: Sinus rhythm Incomplete left bundle branch block Nonspecific ST changes Confirmed by Abner Hoffman [phone]) on 10/03/2023 9:44:57 AM  Radiology DG Chest Port 1 View Result Date: 10/03/2023 CLINICAL DATA:  Back pain and altered mental status. Clinical findings suspicious for sepsis. EXAM: PORTABLE CHEST 1 VIEW COMPARISON:  12/24/2020  FINDINGS: Enlarged cardiac silhouette with an interval increase in size. Stable post CABG changes. Increased prominence of the pulmonary vasculature and interstitial markings. No pleural fluid or airspace consolidation seen. Cervical spine fixation hardware. IMPRESSION: Interval cardiomegaly with increased pulmonary vascular congestion and mild interstitial edema. Electronically Signed   By: Catherin Closs M.D.   On: 10/03/2023 11:52   MR LUMBAR SPINE WO CONTRAST Result Date: 10/03/2023 CLINICAL DATA:  Acute lumbar myelopathy.  History of discitis. EXAM: MRI LUMBAR SPINE WITHOUT CONTRAST TECHNIQUE: Multiplanar, multisequence MR imaging of the lumbar spine was performed. No intravenous contrast was administered. COMPARISON:  07/26/2019 FINDINGS: Segmentation:  Standard. Alignment:  Mild retrolisthesis at L1-2 and L2-3. Vertebrae: Edematous marrow signal around the L3-4 disc space and left facet and at the T12 anterior body underneath an osteophyte. Endplates appear intact at both levels with only mild disc T2 hyperintensity that is similar to other levels. Intervertebral ankylosis has occurred at level of prior L1-2 ankylosis. Conus medullaris and cauda equina: Conus extends to the T12-L1  level. Conus and cauda equina appear normal. Paraspinal and other soft tissues: No evidence of perispinal mass or inflammation. Disc levels: T11-12: Disc space narrowing and bulging with central protrusion at an annular ossification. Degenerative facet spurring on both sides. Mild bilateral foraminal stenosis. T12- L1: Left paracentral extrusion that is upward migrating, chronic. Ventral spondylitic spurring. Negative facets. No neural impingement. L1-L2: Intervertebral collapse and ankylosis. Degenerative facet spurring on both sides. The canal and foramina are patent L2-L3: Disc narrowing and endplate degeneration with endplate ridging and facet spurring eccentric to the right where there is moderate stenosis. Asymmetric right subarticular recess narrowing to a moderate degree. L3-L4: Disc narrowing and bulging with central protrusion. Facet spurring and ligamentum flavum thickening with posterior epidural fat expansion. Advanced thecal sac stenosis. Bilateral foraminal impingement that is advanced L4-L5: Disc narrowing and bulging with right paracentral to foraminal protrusion. Degenerative facet spurring on both sides which is bulky. Moderate right foraminal impingement, borderline advanced. Mild to moderate right subarticular recess narrowing. L5-S1:Disc narrowing and endplate degeneration with disc bulging. Degenerative facet spurring on both sides. Left foraminal impingement with L5 displacement on parasagittal images. IMPRESSION: Marrow edema in the T12 vertebral body, around the L3-4 disc space, and at the left L3-4 facet. No significant disc or paravertebral inflammation, appearance is usually degenerative. Advanced and generalized spinal degeneration with advanced spinal and biforaminal stenosis at L3-4. Moderate to advanced foraminal impingement on the right at L4-5 and left at L5-S1. Electronically Signed   By: Ronnette Coke M.D.   On: 10/03/2023 11:40   CT Head Wo Contrast Result Date:  10/03/2023 CLINICAL DATA:  Head trauma, neck trauma. EXAM: CT HEAD WITHOUT CONTRAST CT CERVICAL SPINE WITHOUT CONTRAST TECHNIQUE: Multidetector CT imaging of the head and cervical spine was performed following the standard protocol without intravenous contrast. Multiplanar CT image reconstructions of the cervical spine were also generated. RADIATION DOSE REDUCTION: This exam was performed according to the departmental dose-optimization program which includes automated exposure control, adjustment of the mA and/or kV according to patient size and/or use of iterative reconstruction technique. COMPARISON:  12/24/2020 FINDINGS: CT HEAD FINDINGS Brain: No acute intracranial hemorrhage. No CT evidence of acute infarct. Nonspecific hypoattenuation in the periventricular and subcortical white matter favored to reflect chronic microvascular ischemic changes. No edema, mass effect, or midline shift. The basilar cisterns are patent. Ventricles: Prominence of the ventricles suggesting underlying parenchymal volume loss. Vascular: Atherosclerotic calcifications of the carotid siphons. No hyperdense vessel. Skull: No  acute or aggressive finding. Orbits: Orbits are symmetric. Sinuses: Mucosal thickening in the alveolar recess of the left maxillary sinus. Other: Mastoid air cells are clear. CT CERVICAL SPINE FINDINGS Alignment: Cervical lordosis is maintained. No significant listhesis. No facet subluxation or dislocation. Skull base and vertebrae: Anterior cervical fusion hardware spanning C4-C7. Hardware is intact. Osseous fusion of the C4-C6 levels noted. There is also likely partial fusion of C3 and C4. No displaced fracture or compression fracture in the cervical spine. No suspicious osseous lesion. Soft tissues and spinal canal: No prevertebral fluid or swelling. No visible canal hematoma. Disc levels: Osseous fusion at multiple levels as above. Mild disc space narrowing at C6-7. Moderate disc space narrowing at C7-T1. Disc  osteophyte complexes at multiple levels. Mild spinal canal stenosis noted at C3-4 facet arthrosis and uncovertebral hypertrophy at multiple levels. Significant foraminal narrowing at multiple levels throughout the cervical spine most pronounced on the left at C3-4. Upper chest: No acute findings. Other: None. IMPRESSION: No CT evidence of acute intracranial abnormality. No acute fracture or traumatic malalignment of the cervical spine. Anterior cervical fusion spanning C4-C7.  Hardware is intact. Degenerative changes of the cervical spine as above. Electronically Signed   By: Denny Flack M.D.   On: 10/03/2023 10:49   CT Cervical Spine Wo Contrast Result Date: 10/03/2023 CLINICAL DATA:  Head trauma, neck trauma. EXAM: CT HEAD WITHOUT CONTRAST CT CERVICAL SPINE WITHOUT CONTRAST TECHNIQUE: Multidetector CT imaging of the head and cervical spine was performed following the standard protocol without intravenous contrast. Multiplanar CT image reconstructions of the cervical spine were also generated. RADIATION DOSE REDUCTION: This exam was performed according to the departmental dose-optimization program which includes automated exposure control, adjustment of the mA and/or kV according to patient size and/or use of iterative reconstruction technique. COMPARISON:  12/24/2020 FINDINGS: CT HEAD FINDINGS Brain: No acute intracranial hemorrhage. No CT evidence of acute infarct. Nonspecific hypoattenuation in the periventricular and subcortical white matter favored to reflect chronic microvascular ischemic changes. No edema, mass effect, or midline shift. The basilar cisterns are patent. Ventricles: Prominence of the ventricles suggesting underlying parenchymal volume loss. Vascular: Atherosclerotic calcifications of the carotid siphons. No hyperdense vessel. Skull: No acute or aggressive finding. Orbits: Orbits are symmetric. Sinuses: Mucosal thickening in the alveolar recess of the left maxillary sinus. Other: Mastoid  air cells are clear. CT CERVICAL SPINE FINDINGS Alignment: Cervical lordosis is maintained. No significant listhesis. No facet subluxation or dislocation. Skull base and vertebrae: Anterior cervical fusion hardware spanning C4-C7. Hardware is intact. Osseous fusion of the C4-C6 levels noted. There is also likely partial fusion of C3 and C4. No displaced fracture or compression fracture in the cervical spine. No suspicious osseous lesion. Soft tissues and spinal canal: No prevertebral fluid or swelling. No visible canal hematoma. Disc levels: Osseous fusion at multiple levels as above. Mild disc space narrowing at C6-7. Moderate disc space narrowing at C7-T1. Disc osteophyte complexes at multiple levels. Mild spinal canal stenosis noted at C3-4 facet arthrosis and uncovertebral hypertrophy at multiple levels. Significant foraminal narrowing at multiple levels throughout the cervical spine most pronounced on the left at C3-4. Upper chest: No acute findings. Other: None. IMPRESSION: No CT evidence of acute intracranial abnormality. No acute fracture or traumatic malalignment of the cervical spine. Anterior cervical fusion spanning C4-C7.  Hardware is intact. Degenerative changes of the cervical spine as above. Electronically Signed   By: Denny Flack M.D.   On: 10/03/2023 10:49    Procedures Procedures  Medications Ordered in ED Medications  ciprofloxacin  (CIPRO ) tablet 500 mg (has no administration in time range)  ketorolac  (TORADOL ) 15 MG/ML injection 15 mg (15 mg Intravenous Given 10/03/23 1227)    ED Course/ Medical Decision Making/ A&P                                 Medical Decision Making 78 year old male with past medical history of coronary artery disease, alcohol abuse, hyperlipidemia, and discitis/osteomyelitis in the past presenting to the emergency department today with worsening back pain and some confusion after a fall at home.  I will further evaluate the patient here with a sepsis  workup.  Unclear if the patient is actually septic at this time so we will hold off on antibiotics.  Will obtain a CT scan of the patient's head and cervical spine for further evaluation for acute traumatic injuries.  Will obtain an MRI of his lumbar spine to evaluate for recurrent discitis/osteomyelitis given the back pain and questionable history of fever per medics.  Will also obtain an alcohol level.  I will reevaluate for ultimate disposition.  The patient's labs are largely unremarkable.  His lactic acid is very mildly elevated which is nonspecific.  CT scans and MRIs do not show any acute findings.  He does have trace leukocytes with rare bacteria and 6-10 WBCs.  Will treat him with ciprofloxacin .  It does appear that he has had an issue with cephalosporins.  He is ambulatory here and is feeling back to his baseline.  He is discharged with return precautions.  Amount and/or Complexity of Data Reviewed Labs: ordered. Radiology: ordered.  Risk Prescription drug management.           Final Clinical Impression(s) / ED Diagnoses Final diagnoses:  Urinary tract infection without hematuria, site unspecified    Rx / DC Orders ED Discharge Orders          Ordered    ciprofloxacin  (CIPRO ) 500 MG tablet  Every 12 hours        10/03/23 1315              Carin Charleston, MD 10/03/23 1315

## 2023-10-03 NOTE — Discharge Instructions (Signed)
 On second look it does look like your urine may be infected.  Please take the antibiotic as prescribed and follow-up with your doctor for reevaluation.  Return to the ER for worsening symptoms.

## 2023-10-05 ENCOUNTER — Other Ambulatory Visit: Payer: Self-pay

## 2023-10-05 ENCOUNTER — Emergency Department (HOSPITAL_COMMUNITY)
Admission: EM | Admit: 2023-10-05 | Discharge: 2023-10-05 | Disposition: A | Discharge status: Home / Self Care | Attending: Emergency Medicine | Admitting: Emergency Medicine

## 2023-10-05 DIAGNOSIS — Z79899 Other long term (current) drug therapy: Secondary | ICD-10-CM | POA: Diagnosis not present

## 2023-10-05 DIAGNOSIS — I10 Essential (primary) hypertension: Secondary | ICD-10-CM | POA: Insufficient documentation

## 2023-10-05 DIAGNOSIS — Z87440 Personal history of urinary (tract) infections: Secondary | ICD-10-CM | POA: Insufficient documentation

## 2023-10-05 DIAGNOSIS — R7881 Bacteremia: Secondary | ICD-10-CM | POA: Insufficient documentation

## 2023-10-05 LAB — COMPREHENSIVE METABOLIC PANEL WITH GFR
ALT: 27 U/L (ref 0–44)
AST: 43 U/L — ABNORMAL HIGH (ref 15–41)
Albumin: 3.3 g/dL — ABNORMAL LOW (ref 3.5–5.0)
Alkaline Phosphatase: 40 U/L (ref 38–126)
Anion gap: 10 (ref 5–15)
BUN: 26 mg/dL — ABNORMAL HIGH (ref 8–23)
CO2: 24 mmol/L (ref 22–32)
Calcium: 8.6 mg/dL — ABNORMAL LOW (ref 8.9–10.3)
Chloride: 106 mmol/L (ref 98–111)
Creatinine, Ser: 0.83 mg/dL (ref 0.61–1.24)
GFR, Estimated: >60 mL/min (ref >60)
Glucose, Bld: 105 mg/dL — ABNORMAL HIGH (ref 70–99)
Potassium: 4 mmol/L (ref 3.5–5.1)
Sodium: 140 mmol/L (ref 135–145)
Total Bilirubin: 0.7 mg/dL (ref 0.0–1.2)
Total Protein: 5.8 g/dL — ABNORMAL LOW (ref 6.5–8.1)

## 2023-10-05 LAB — URINALYSIS, W/ REFLEX TO CULTURE (INFECTION SUSPECTED)
Bacteria, UA: NONE SEEN
Bilirubin Urine: NEGATIVE
Glucose, UA: NEGATIVE mg/dL
Ketones, ur: 20 mg/dL — AB
Leukocytes,Ua: NEGATIVE
Nitrite: NEGATIVE
Protein, ur: NEGATIVE mg/dL
Specific Gravity, Urine: 1.025 (ref 1.005–1.030)
pH: 5 (ref 5.0–8.0)

## 2023-10-05 LAB — CBC WITH DIFFERENTIAL/PLATELET
Abs Immature Granulocytes: 0.04 10*3/uL (ref 0.00–0.07)
Basophils Absolute: 0 10*3/uL (ref 0.0–0.1)
Basophils Relative: 0 %
Eosinophils Absolute: 0.1 10*3/uL (ref 0.0–0.5)
Eosinophils Relative: 1 %
HCT: 41.7 % (ref 39.0–52.0)
Hemoglobin: 13.8 g/dL (ref 13.0–17.0)
Immature Granulocytes: 0 %
Lymphocytes Relative: 9 %
Lymphs Abs: 0.9 10*3/uL (ref 0.7–4.0)
MCH: 31.9 pg (ref 26.0–34.0)
MCHC: 33.1 g/dL (ref 30.0–36.0)
MCV: 96.5 fL (ref 80.0–100.0)
Monocytes Absolute: 1.1 10*3/uL — ABNORMAL HIGH (ref 0.1–1.0)
Monocytes Relative: 11 %
Neutro Abs: 7.8 10*3/uL — ABNORMAL HIGH (ref 1.7–7.7)
Neutrophils Relative %: 79 %
Platelets: 174 10*3/uL (ref 150–400)
RBC: 4.32 MIL/uL (ref 4.22–5.81)
RDW: 13.3 % (ref 11.5–15.5)
WBC: 9.9 10*3/uL (ref 4.0–10.5)
nRBC: 0 % (ref 0.0–0.2)

## 2023-10-05 LAB — BLOOD CULTURE ID PANEL (REFLEXED) - BCID2

## 2023-10-05 LAB — I-STAT CG4 LACTIC ACID, ED: Lactic Acid, Venous: 0.7 mmol/L (ref 0.5–1.9)

## 2023-10-05 LAB — PROTIME-INR
INR: 1 (ref 0.8–1.2)
Prothrombin Time: 13.5 s (ref 11.4–15.2)

## 2023-10-05 MED ORDER — SODIUM CHLORIDE 0.9 % IV SOLN
3.0000 g | Freq: Once | INTRAVENOUS | Status: AC
  Administered 2023-10-05: 3 g via INTRAVENOUS
  Filled 2023-10-05: qty 8

## 2023-10-05 MED ORDER — LACTATED RINGERS IV BOLUS (SEPSIS)
500.0000 mL | Freq: Once | INTRAVENOUS | Status: AC
  Administered 2023-10-05: 500 mL via INTRAVENOUS

## 2023-10-05 NOTE — ED Provider Notes (Addendum)
 [place] EMERGENCY DEPARTMENT AT [facility] Provider Note   CSN: [id number] Arrival date & time: 10/05/23  1041     History  Chief Complaint  Patient presents with   Abnormal Lab    Ivan [name] is a 78 y.o. male.  HPI 78 year old male with a history of hypertension, BPH, abdominal aortic aneurysm, positive quant of Farren TB, kidney stones, CVA, hypertension who presented to the emergency department after being called and told that he had positive blood cultures.  He was seen in the emergency department 5/23 with complaints of low back pain and altered mental status.  He had a CT head, CT cervical spine and MR lumbar spine which were without acute findings.  His UA showed trace leukocytes, 6-10 WBCs, rare bacteria, he was treated for UTI and sent home on ciprofloxacin .  He received a call today stating that 1 out of 2 blood cultures were positive.  Per chart review, this appears to be GPC's.  He was told to come to the emergency department.  He states that his back pain is actually improved since being seen in the emergency department.  He has been compliant with his antibiotics.  Of note, patient is a actively working nurse at [employer]    Home Medications Prior to Admission medications   Medication Sig Start Date End Date Taking? Authorizing Provider  acetaminophen  (TYLENOL ) 500 MG tablet Take 2 tablets (1,000 mg total) by mouth every 6 (six) hours. 07/04/19   Lacinda Pica, MD  ALPRAZolam  (XANAX ) 1 MG tablet Take 1 mg by mouth 2 (two) times daily as needed. 08/14/21   [provider]  amLODipine  (NORVASC ) 5 MG tablet TAKE 1 TABLET (5 MG TOTAL) BY MOUTH DAILY. 08/28/23   Nahser, Lela Purple, MD  amphetamine -dextroamphetamine  (ADDERALL) 20 MG tablet Take 20 mg by mouth 2 (two) times daily as needed.    [provider]  aspirin  EC 81 MG EC tablet Take 1 tablet (81 mg total) by mouth daily. 10/14/14   Meng, Hao, PA  buPROPion  (WELLBUTRIN  XL) 150 MG 24  hr tablet Take 150 mg by mouth every morning. 10/23/21   [provider]  carvedilol  (COREG ) 12.5 MG tablet TAKE 1 TABLET(12.5 MG) BY MOUTH TWICE DAILY 11/19/22   Nahser, Lela Purple, MD  ciprofloxacin  (CIPRO ) 500 MG tablet Take 1 tablet (500 mg total) by mouth every 12 (twelve) hours. 10/03/23   Carin Charleston, MD  furosemide  (LASIX ) 20 MG tablet TAKE 1 TABLET (20 MG TOTAL) BY MOUTH DAILY AS NEEDED FOR EDEMA (LOWER EXTREMITY EDEMA). 11/19/22 02/17/23  Nahser, Lela Purple, MD  gabapentin  (NEURONTIN ) 300 MG capsule Take 300 mg by mouth as needed (for lumbar pain in spine).    [provider]  lidocaine -prilocaine (EMLA) cream as needed (pain). 06/20/22   [provider]  Multiple Vitamin (MULTIVITAMIN WITH MINERALS) TABS tablet Take 1 tablet by mouth daily. 07/04/19   Lacinda Pica, MD  naproxen  (NAPROSYN ) 250 MG tablet Take 1 tablet (250 mg total) by mouth every 8 (eight) hours as needed for moderate pain. 06/09/19   Catheryn Cluck, MD  polyethylene glycol (MIRALAX  / GLYCOLAX ) 17 g packet Take 17 g by mouth daily as needed for mild constipation. 07/04/19   Lacinda Pica, MD  rosuvastatin  (CRESTOR ) 20 MG tablet TAKE 1 TABLET BY MOUTH EVERY DAY 08/26/23   Nahser, Lela Purple, MD  tadalafil  (CIALIS ) 10 MG tablet Take 10 mg by mouth daily as needed.    [provider]  tamsulosin  (FLOMAX ) 0.4 MG CAPS capsule Take 0.4 mg by mouth daily. 10/30/21   [provider]  testosterone  cypionate (DEPOTESTOSTERONE CYPIONATE) 200 MG/ML injection every 21 ( twenty-one) days. 12/20/20   [provider]  traMADol  (ULTRAM ) 50 MG tablet  11/10/20   [provider]  VRAYLAR 1.5 MG capsule Take 1.5 mg by mouth daily. 10/24/22   [provider]  DULoxetine  (CYMBALTA ) 30 MG capsule Take 2 capsules (60 mg total) by mouth daily. Patient not taking: No sig reported 11/24/19 02/13/20  Geoffry Kill, MD      Allergies    Cefepime , Merrem  [meropenem ], Ace inhibitors,  Contrast media [iodinated contrast media], and Vancomycin     Review of Systems   Review of Systems Ten systems reviewed and are negative for acute change, except as noted in the HPI.   Physical Exam Updated Vital Signs BP (!) 112/59 (BP Location: Right Arm)   Pulse (!) 58   Temp 98.4 F (36.9 C) (Oral)   Resp 16   Ht 5\' 11"  (1.803 m)   Wt 79.8 kg   SpO2 96%   BMI 24.55 kg/m  Physical Exam Vitals and nursing note reviewed.  Constitutional:      General: He is not in acute distress.    Appearance: He is well-developed.  HENT:     Head: Normocephalic and atraumatic.  Eyes:     Conjunctiva/sclera: Conjunctivae normal.  Cardiovascular:     Rate and Rhythm: Normal rate and regular rhythm.     Heart sounds: No murmur heard. Pulmonary:     Effort: Pulmonary effort is normal. No respiratory distress.     Breath sounds: Normal breath sounds.  Abdominal:     Palpations: Abdomen is soft.     Tenderness: There is no abdominal tenderness. There is no right CVA tenderness or left CVA tenderness.  Musculoskeletal:        General: No swelling.     Cervical back: Neck supple.  Skin:    General: Skin is warm and dry.     Capillary Refill: Capillary refill takes less than 2 seconds.  Neurological:     Mental Status: He is alert.  Psychiatric:        Mood and Affect: Mood normal.     ED Results / Procedures / Treatments   Labs (all labs ordered are listed, but only abnormal results are displayed) Labs Reviewed  COMPREHENSIVE METABOLIC PANEL WITH GFR - Abnormal; Notable for the following components:      Result Value   Glucose, Bld 105 (*)    BUN 26 (*)    Calcium  8.6 (*)    Total Protein 5.8 (*)    Albumin 3.3 (*)    AST 43 (*)    All other components within normal limits  CBC WITH DIFFERENTIAL/PLATELET - Abnormal; Notable for the following components:   Neutro Abs 7.8 (*)    Monocytes Absolute 1.1 (*)    All other components within normal limits  URINALYSIS, W/ REFLEX TO  CULTURE (INFECTION SUSPECTED) - Abnormal; Notable for the following components:   Hgb urine dipstick SMALL (*)    Ketones, ur 20 (*)    All other components within normal limits  CULTURE, BLOOD (ROUTINE X 2)  CULTURE, BLOOD (ROUTINE X 2)  PROTIME-INR  I-STAT CG4 LACTIC ACID, ED    EKG None  Radiology No results found.  Procedures Procedures    Medications Ordered in ED Medications  lactated ringers  bolus 500 mL (  0 mLs Intravenous Stopped 10/05/23 1415)  Ampicillin -Sulbactam (UNASYN ) 3 g in sodium chloride  0.9 % 100 mL IVPB (0 g Intravenous Stopped 10/05/23 1415)    ED Course/ Medical Decision Making/ A&P Clinical Course as of 10/05/23 1605  Sun Oct 05, 2023  1331 Patient presenting with 1 out of 2 positive blood cultures for GPC's, recently seen here for UTI.  His UA actually does not look overly suggestive of UTI, and I do query if positive blood cultures are cross contaminant.  He has minimal CVA tenderness on exam, reports improvement in this since his last visit.  I do not think he needs additional CT imaging at this time.  Repeat blood cultures have been obtained.  I have ordered repeat labs, blood cultures, lactic acid.  Patient has a allergy to cephalosporins and vancomycin , discussed with pharmacy and will start with Unasyn  for now and await speciation.  Will likely need admission. [MB]  1445 Labs reviewed, slight AST elevation at 43, electrolytes normal, renal function normal.  Lactic acid is normal.  CBC without leukocytosis.  Discussed with pharmacy due to numerous allergies including cephalosporins and vancomycin .  Given a dose of Unasyn .  Will consult hospitalist for admission. [MB]  1525 Discussed with hospitalist, I do think that his blood cultures are Cross contaminant.  They recommend discussing with ID.  I spoke with infectious disease, bio fire did not speciate any organisms.  We do suspect that this is likely cross contaminant given also reassuring lab work and  presentation.  They did offer to give a dose of dalbavancin or a course of Zyvox, I discussed this with the patient who has deferred due to cost.  He would like to stay on the Cipro , and will return if his blood cultures returned positive again.  I think this is reasonable.  Stable for discharge with strict return precautions. [MB]    Clinical Course User Index [MB] Refugia Canton, PA-C                                 Medical Decision Making Amount and/or Complexity of Data Reviewed Labs: ordered.       Final Clinical Impression(s) / ED Diagnoses Final diagnoses:  Positive blood culture    Rx / DC Orders ED Discharge Orders     None         Refugia Canton, PA-C 10/05/23 1530    Refugia Canton, PA-C 10/05/23 1605    Trish Furl, MD 10/05/23 1626

## 2023-10-05 NOTE — Discharge Instructions (Signed)
 Continue taking your ciprofloxacin  as discussed, complete the course.  Please return to the ER if you have any worsening symptoms including worsening fevers, chills, back pain, or any new or concerning symptoms.  Our department will call you if your blood cultures are positive again.

## 2023-10-05 NOTE — ED Triage Notes (Signed)
 Pt was called with positive blood culture results and told to come in for IV ABX. P has 2/10 left back pain and some dysuria.

## 2023-10-06 LAB — CULTURE, BLOOD (ROUTINE X 2): Culture  Setup Time: NO GROWTH

## 2023-10-07 ENCOUNTER — Telehealth (HOSPITAL_BASED_OUTPATIENT_CLINIC_OR_DEPARTMENT_OTHER): Payer: Self-pay | Admitting: *Deleted

## 2023-10-07 NOTE — Telephone Encounter (Signed)
 Post ED Visit - Positive Culture Follow-up  Culture report reviewed by antimicrobial stewardship pharmacist: [facility] [x]  Sheryle Donning, [place].D. []  Skeet Duke, Pharm.D., BCPS AQ-ID []  Leslee Rase, Pharm.D., BCPS []  Garland Junk, Pharm.D., BCPS []  [place], [address].D., BCPS, AAHIVP []  Alcide Aly, Pharm.D., BCPS, AAHIVP []  Jerri Morale, PharmD, BCPS []  Graham Laws, PharmD, BCPS []  Cleda Curly, PharmD, BCPS []  Tamar Fairly, PharmD []  Ballard Levels, PharmD, BCPS []  Ollen Beverage, PharmD  [facility] []  Arlyne Bering, PharmD []  Sherryle Don, PharmD []  [place] Gelinas, PharmD []  Delila Felty, Rph []  [place]) Cleora Daft, PharmD []  Augustina Block, PharmD []  Arie Kurtz, PharmD []  Sharlyn Deaner, PharmD []  Agnes Hose, PharmD []  Kendall Pauls, PharmD []  Gladstone Lamer, PharmD []  Armanda Bern, PharmD []  Tera Fellows, PharmD   Positive blood culture Treated with cipro , organism sensitive to the same and no further patient follow-up is required at this time.  Repeat blood cx on 10/05/23 was negative.  Pt was provided strict return precautions.  Zeb Heys 10/07/2023, 7:39 AM

## 2023-10-10 LAB — CULTURE, BLOOD (ROUTINE X 2)
Culture: NO GROWTH
Culture: NO GROWTH

## 2023-10-23 ENCOUNTER — Encounter: Payer: Self-pay | Admitting: Nurse Practitioner

## 2023-11-05 ENCOUNTER — Encounter: Payer: Self-pay | Admitting: Urology

## 2023-11-05 DIAGNOSIS — N138 Other obstructive and reflux uropathy: Secondary | ICD-10-CM

## 2023-11-05 DIAGNOSIS — N135 Crossing vessel and stricture of ureter without hydronephrosis: Secondary | ICD-10-CM

## 2023-11-06 ENCOUNTER — Encounter: Payer: Self-pay | Admitting: Urology

## 2023-11-06 ENCOUNTER — Other Ambulatory Visit: Payer: Self-pay | Admitting: Urology

## 2023-11-06 DIAGNOSIS — N135 Crossing vessel and stricture of ureter without hydronephrosis: Secondary | ICD-10-CM

## 2023-11-06 DIAGNOSIS — N138 Other obstructive and reflux uropathy: Secondary | ICD-10-CM

## 2023-11-07 ENCOUNTER — Encounter: Payer: Self-pay | Admitting: Urology

## 2023-11-10 ENCOUNTER — Ambulatory Visit
Admission: RE | Admit: 2023-11-10 | Discharge: 2023-11-10 | Disposition: A | Discharge status: Home / Self Care | Source: Ambulatory Visit | Attending: Urology | Admitting: Urology

## 2023-11-10 DIAGNOSIS — N135 Crossing vessel and stricture of ureter without hydronephrosis: Secondary | ICD-10-CM

## 2023-11-10 DIAGNOSIS — N138 Other obstructive and reflux uropathy: Secondary | ICD-10-CM
